# Patient Record
Sex: Female | Born: 1955
Health system: Southern US, Community
[De-identification: ages and names within clinical notes are randomized; demographics above are authoritative.]

## PROBLEM LIST (undated history)

## (undated) DIAGNOSIS — L0293 Carbuncle, unspecified: Secondary | ICD-10-CM

## (undated) DIAGNOSIS — M545 Low back pain: Secondary | ICD-10-CM

## (undated) DIAGNOSIS — L0292 Furuncle, unspecified: Secondary | ICD-10-CM

## (undated) DIAGNOSIS — Z87442 Personal history of urinary calculi: Secondary | ICD-10-CM

## (undated) DIAGNOSIS — G4733 Obstructive sleep apnea (adult) (pediatric): Secondary | ICD-10-CM

## (undated) DIAGNOSIS — K5792 Diverticulitis of intestine, part unspecified, without perforation or abscess without bleeding: Secondary | ICD-10-CM

## (undated) DIAGNOSIS — M810 Age-related osteoporosis without current pathological fracture: Secondary | ICD-10-CM

## (undated) DIAGNOSIS — G243 Spasmodic torticollis: Secondary | ICD-10-CM

## (undated) DIAGNOSIS — K5790 Diverticulosis of intestine, part unspecified, without perforation or abscess without bleeding: Secondary | ICD-10-CM

## (undated) DIAGNOSIS — S93622A Sprain of tarsometatarsal ligament of left foot, initial encounter: Secondary | ICD-10-CM

## (undated) DIAGNOSIS — J449 Chronic obstructive pulmonary disease, unspecified: Secondary | ICD-10-CM

## (undated) DIAGNOSIS — J189 Pneumonia, unspecified organism: Secondary | ICD-10-CM

## (undated) DIAGNOSIS — T7840XA Allergy, unspecified, initial encounter: Secondary | ICD-10-CM

## (undated) DIAGNOSIS — Z860100 Personal history of colon polyps, unspecified: Secondary | ICD-10-CM

## (undated) DIAGNOSIS — K219 Gastro-esophageal reflux disease without esophagitis: Secondary | ICD-10-CM

## (undated) DIAGNOSIS — I10 Essential (primary) hypertension: Secondary | ICD-10-CM

## (undated) DIAGNOSIS — F329 Major depressive disorder, single episode, unspecified: Secondary | ICD-10-CM

## (undated) DIAGNOSIS — R251 Tremor, unspecified: Secondary | ICD-10-CM

## (undated) DIAGNOSIS — N200 Calculus of kidney: Secondary | ICD-10-CM

## (undated) DIAGNOSIS — K3184 Gastroparesis: Secondary | ICD-10-CM

## (undated) DIAGNOSIS — R197 Diarrhea, unspecified: Secondary | ICD-10-CM

## (undated) DIAGNOSIS — F32A Depression, unspecified: Secondary | ICD-10-CM

## (undated) DIAGNOSIS — E785 Hyperlipidemia, unspecified: Secondary | ICD-10-CM

## (undated) DIAGNOSIS — Z9989 Dependence on other enabling machines and devices: Secondary | ICD-10-CM

## (undated) DIAGNOSIS — G5603 Carpal tunnel syndrome, bilateral upper limbs: Secondary | ICD-10-CM

## (undated) DIAGNOSIS — K561 Intussusception: Secondary | ICD-10-CM

## (undated) DIAGNOSIS — G4730 Sleep apnea, unspecified: Secondary | ICD-10-CM

## (undated) DIAGNOSIS — M75100 Unspecified rotator cuff tear or rupture of unspecified shoulder, not specified as traumatic: Secondary | ICD-10-CM

## (undated) DIAGNOSIS — H269 Unspecified cataract: Secondary | ICD-10-CM

## (undated) DIAGNOSIS — Z872 Personal history of diseases of the skin and subcutaneous tissue: Secondary | ICD-10-CM

## (undated) DIAGNOSIS — M1990 Unspecified osteoarthritis, unspecified site: Secondary | ICD-10-CM

## (undated) DIAGNOSIS — F419 Anxiety disorder, unspecified: Secondary | ICD-10-CM

## (undated) DIAGNOSIS — R42 Dizziness and giddiness: Secondary | ICD-10-CM

## (undated) HISTORY — DX: Depression, unspecified: F32.A

## (undated) HISTORY — DX: Personal history of diseases of the skin and subcutaneous tissue: Z87.2

## (undated) HISTORY — DX: Gastroparesis: K31.84

## (undated) HISTORY — DX: Unspecified cataract: H26.9

## (undated) HISTORY — DX: Calculus of kidney: N20.0

## (undated) HISTORY — DX: Low back pain: M54.5

## (undated) HISTORY — DX: Morbid (severe) obesity due to excess calories: E66.01

## (undated) HISTORY — DX: Chronic obstructive pulmonary disease, unspecified: J44.9

## (undated) HISTORY — DX: Gastro-esophageal reflux disease without esophagitis: K21.9

## (undated) HISTORY — DX: Carpal tunnel syndrome, bilateral upper limbs: G56.03

## (undated) HISTORY — PX: NASAL SINUS SURGERY: SHX719

## (undated) HISTORY — DX: Personal history of colon polyps, unspecified: Z86.0100

## (undated) HISTORY — PX: LUMBAR LAMINECTOMY: SHX95

## (undated) HISTORY — DX: Dependence on other enabling machines and devices: Z99.89

## (undated) HISTORY — DX: Allergy, unspecified, initial encounter: T78.40XA

## (undated) HISTORY — PX: KNEE ARTHROSCOPY: SHX127

## (undated) HISTORY — PX: EYE SURGERY: SHX253

## (undated) HISTORY — PX: SUBACROMIAL DECOMPRESSION: SHX5174

## (undated) HISTORY — DX: Anxiety disorder, unspecified: F41.9

## (undated) HISTORY — DX: Age-related osteoporosis without current pathological fracture: M81.0

## (undated) HISTORY — DX: Dizziness and giddiness: R42

## (undated) HISTORY — DX: Unspecified osteoarthritis, unspecified site: M19.90

## (undated) HISTORY — DX: Essential (primary) hypertension: I10

## (undated) HISTORY — DX: Obstructive sleep apnea (adult) (pediatric): G47.33

## (undated) HISTORY — PX: ELBOW SURGERY: SHX618

## (undated) HISTORY — DX: Diverticulosis of intestine, part unspecified, without perforation or abscess without bleeding: K57.90

## (undated) HISTORY — DX: Intussusception: K56.1

## (undated) HISTORY — PX: TUBAL LIGATION: SHX77

## (undated) HISTORY — DX: Diverticulitis of intestine, part unspecified, without perforation or abscess without bleeding: K57.92

## (undated) HISTORY — DX: Pneumonia, unspecified organism: J18.9

## (undated) HISTORY — DX: Personal history of colonic polyps: Z86.010

## (undated) HISTORY — DX: Major depressive disorder, single episode, unspecified: F32.9

## (undated) HISTORY — DX: Sprain of tarsometatarsal ligament of left foot, initial encounter: S93.622A

## (undated) HISTORY — DX: Hyperlipidemia, unspecified: E78.5

## (undated) HISTORY — PX: PILONIDAL CYST EXCISION: SHX744

## (undated) HISTORY — PX: HAND SURGERY: SHX662

---

## 1998-12-24 ENCOUNTER — Encounter: Admission: RE | Admit: 1998-12-24 | Discharge: 1998-12-24 | Payer: Self-pay | Admitting: Hematology and Oncology

## 1999-01-29 ENCOUNTER — Encounter: Admission: RE | Admit: 1999-01-29 | Discharge: 1999-01-29 | Payer: Self-pay | Admitting: Internal Medicine

## 1999-02-23 ENCOUNTER — Encounter: Admission: RE | Admit: 1999-02-23 | Discharge: 1999-02-23 | Payer: Self-pay | Admitting: Internal Medicine

## 1999-03-09 ENCOUNTER — Encounter: Admission: RE | Admit: 1999-03-09 | Discharge: 1999-03-09 | Payer: Self-pay | Admitting: Internal Medicine

## 1999-04-26 ENCOUNTER — Encounter: Admission: RE | Admit: 1999-04-26 | Discharge: 1999-04-26 | Payer: Self-pay | Admitting: Internal Medicine

## 1999-07-05 ENCOUNTER — Encounter: Admission: RE | Admit: 1999-07-05 | Discharge: 1999-07-05 | Payer: Self-pay | Admitting: Internal Medicine

## 1999-07-11 ENCOUNTER — Encounter: Payer: Self-pay | Admitting: *Deleted

## 1999-07-11 ENCOUNTER — Ambulatory Visit (HOSPITAL_COMMUNITY): Admission: RE | Admit: 1999-07-11 | Discharge: 1999-07-11 | Payer: Self-pay | Admitting: *Deleted

## 1999-07-21 ENCOUNTER — Ambulatory Visit (HOSPITAL_COMMUNITY): Admission: RE | Admit: 1999-07-21 | Discharge: 1999-07-21 | Payer: Self-pay | Admitting: *Deleted

## 1999-07-26 ENCOUNTER — Encounter: Admission: RE | Admit: 1999-07-26 | Discharge: 1999-07-26 | Payer: Self-pay | Admitting: Hematology and Oncology

## 1999-08-04 ENCOUNTER — Ambulatory Visit (HOSPITAL_COMMUNITY): Admission: RE | Admit: 1999-08-04 | Discharge: 1999-08-04 | Payer: Self-pay | Admitting: *Deleted

## 1999-08-04 ENCOUNTER — Encounter: Payer: Self-pay | Admitting: *Deleted

## 1999-08-12 ENCOUNTER — Encounter: Admission: RE | Admit: 1999-08-12 | Discharge: 1999-08-12 | Payer: Self-pay | Admitting: Internal Medicine

## 1999-09-30 ENCOUNTER — Encounter: Admission: RE | Admit: 1999-09-30 | Discharge: 1999-09-30 | Payer: Self-pay | Admitting: Internal Medicine

## 1999-11-22 ENCOUNTER — Encounter: Admission: RE | Admit: 1999-11-22 | Discharge: 1999-11-22 | Payer: Self-pay | Admitting: Internal Medicine

## 2000-03-27 ENCOUNTER — Encounter: Admission: RE | Admit: 2000-03-27 | Discharge: 2000-03-27 | Payer: Self-pay | Admitting: Internal Medicine

## 2000-04-09 ENCOUNTER — Emergency Department (HOSPITAL_COMMUNITY): Admission: EM | Admit: 2000-04-09 | Discharge: 2000-04-09 | Payer: Self-pay | Admitting: *Deleted

## 2000-04-09 ENCOUNTER — Encounter: Payer: Self-pay | Admitting: *Deleted

## 2000-05-30 ENCOUNTER — Ambulatory Visit (HOSPITAL_COMMUNITY): Admission: RE | Admit: 2000-05-30 | Discharge: 2000-05-30 | Payer: Self-pay | Admitting: Orthopedic Surgery

## 2000-05-30 ENCOUNTER — Encounter: Admission: RE | Admit: 2000-05-30 | Discharge: 2000-05-30 | Payer: Self-pay | Admitting: Internal Medicine

## 2000-05-30 ENCOUNTER — Encounter: Payer: Self-pay | Admitting: Orthopedic Surgery

## 2000-07-14 ENCOUNTER — Encounter: Payer: Self-pay | Admitting: Orthopedic Surgery

## 2000-07-21 ENCOUNTER — Observation Stay (HOSPITAL_COMMUNITY): Admission: RE | Admit: 2000-07-21 | Discharge: 2000-07-22 | Payer: Self-pay | Admitting: Orthopedic Surgery

## 2000-07-21 ENCOUNTER — Encounter (INDEPENDENT_AMBULATORY_CARE_PROVIDER_SITE_OTHER): Payer: Self-pay | Admitting: Specialist

## 2000-07-21 ENCOUNTER — Encounter: Payer: Self-pay | Admitting: Orthopedic Surgery

## 2000-09-15 ENCOUNTER — Encounter: Admission: RE | Admit: 2000-09-15 | Discharge: 2000-09-15 | Payer: Self-pay | Admitting: Internal Medicine

## 2000-12-19 ENCOUNTER — Encounter: Admission: RE | Admit: 2000-12-19 | Discharge: 2000-12-19 | Payer: Self-pay | Admitting: Internal Medicine

## 2001-02-14 ENCOUNTER — Encounter: Admission: RE | Admit: 2001-02-14 | Discharge: 2001-02-14 | Payer: Self-pay | Admitting: Hematology and Oncology

## 2001-03-20 ENCOUNTER — Encounter: Admission: RE | Admit: 2001-03-20 | Discharge: 2001-03-20 | Payer: Self-pay | Admitting: Internal Medicine

## 2001-04-18 ENCOUNTER — Emergency Department (HOSPITAL_COMMUNITY): Admission: EM | Admit: 2001-04-18 | Discharge: 2001-04-18 | Payer: Self-pay | Admitting: Emergency Medicine

## 2001-04-18 ENCOUNTER — Encounter: Payer: Self-pay | Admitting: Emergency Medicine

## 2001-04-22 ENCOUNTER — Encounter: Payer: Self-pay | Admitting: Emergency Medicine

## 2001-04-22 ENCOUNTER — Emergency Department (HOSPITAL_COMMUNITY): Admission: EM | Admit: 2001-04-22 | Discharge: 2001-04-22 | Payer: Self-pay | Admitting: Emergency Medicine

## 2001-04-23 ENCOUNTER — Encounter: Admission: RE | Admit: 2001-04-23 | Discharge: 2001-04-23 | Payer: Self-pay | Admitting: Internal Medicine

## 2001-04-26 ENCOUNTER — Encounter: Payer: Self-pay | Admitting: Internal Medicine

## 2001-04-26 ENCOUNTER — Ambulatory Visit (HOSPITAL_COMMUNITY): Admission: RE | Admit: 2001-04-26 | Discharge: 2001-04-26 | Payer: Self-pay | Admitting: Internal Medicine

## 2001-04-26 ENCOUNTER — Encounter: Admission: RE | Admit: 2001-04-26 | Discharge: 2001-04-26 | Payer: Self-pay | Admitting: Internal Medicine

## 2001-04-28 ENCOUNTER — Encounter: Payer: Self-pay | Admitting: Internal Medicine

## 2001-04-28 ENCOUNTER — Emergency Department (HOSPITAL_COMMUNITY): Admission: EM | Admit: 2001-04-28 | Discharge: 2001-04-28 | Payer: Self-pay

## 2001-05-17 ENCOUNTER — Ambulatory Visit (HOSPITAL_COMMUNITY): Admission: RE | Admit: 2001-05-17 | Discharge: 2001-05-17 | Payer: Self-pay | Admitting: Urology

## 2001-05-17 ENCOUNTER — Encounter: Admission: RE | Admit: 2001-05-17 | Discharge: 2001-05-17 | Payer: Self-pay

## 2001-05-17 ENCOUNTER — Encounter: Payer: Self-pay | Admitting: Urology

## 2001-06-05 ENCOUNTER — Ambulatory Visit (HOSPITAL_COMMUNITY): Admission: RE | Admit: 2001-06-05 | Discharge: 2001-06-05 | Payer: Self-pay | Admitting: Urology

## 2001-06-07 ENCOUNTER — Ambulatory Visit (HOSPITAL_COMMUNITY): Admission: RE | Admit: 2001-06-07 | Discharge: 2001-06-07 | Payer: Self-pay | Admitting: Urology

## 2001-06-07 ENCOUNTER — Encounter: Payer: Self-pay | Admitting: Urology

## 2001-07-19 ENCOUNTER — Encounter: Admission: RE | Admit: 2001-07-19 | Discharge: 2001-07-19 | Payer: Self-pay | Admitting: Obstetrics

## 2001-10-29 ENCOUNTER — Encounter: Admission: RE | Admit: 2001-10-29 | Discharge: 2002-01-27 | Payer: Self-pay | Admitting: Internal Medicine

## 2002-05-20 ENCOUNTER — Ambulatory Visit (HOSPITAL_BASED_OUTPATIENT_CLINIC_OR_DEPARTMENT_OTHER): Admission: RE | Admit: 2002-05-20 | Discharge: 2002-05-20 | Payer: Self-pay | Admitting: Emergency Medicine

## 2003-02-12 ENCOUNTER — Encounter (INDEPENDENT_AMBULATORY_CARE_PROVIDER_SITE_OTHER): Payer: Self-pay | Admitting: Specialist

## 2003-02-12 ENCOUNTER — Encounter: Payer: Self-pay | Admitting: Internal Medicine

## 2003-02-12 ENCOUNTER — Ambulatory Visit (HOSPITAL_COMMUNITY): Admission: RE | Admit: 2003-02-12 | Discharge: 2003-02-12 | Payer: Self-pay | Admitting: *Deleted

## 2003-02-12 DIAGNOSIS — K5730 Diverticulosis of large intestine without perforation or abscess without bleeding: Secondary | ICD-10-CM | POA: Insufficient documentation

## 2003-12-18 ENCOUNTER — Inpatient Hospital Stay (HOSPITAL_COMMUNITY): Admission: AD | Admit: 2003-12-18 | Discharge: 2003-12-20 | Payer: Self-pay | Admitting: Family Medicine

## 2003-12-19 ENCOUNTER — Encounter: Payer: Self-pay | Admitting: Internal Medicine

## 2003-12-23 ENCOUNTER — Encounter: Admission: RE | Admit: 2003-12-23 | Discharge: 2003-12-23 | Payer: Self-pay | Admitting: Family Medicine

## 2003-12-31 ENCOUNTER — Ambulatory Visit (HOSPITAL_COMMUNITY): Admission: RE | Admit: 2003-12-31 | Discharge: 2003-12-31 | Payer: Self-pay | Admitting: Emergency Medicine

## 2004-10-02 ENCOUNTER — Emergency Department (HOSPITAL_COMMUNITY): Admission: EM | Admit: 2004-10-02 | Discharge: 2004-10-03 | Payer: Self-pay | Admitting: Emergency Medicine

## 2004-10-17 HISTORY — PX: CARPAL TUNNEL RELEASE: SHX101

## 2004-10-29 ENCOUNTER — Ambulatory Visit (HOSPITAL_COMMUNITY): Admission: RE | Admit: 2004-10-29 | Discharge: 2004-10-29 | Payer: Self-pay | Admitting: Emergency Medicine

## 2004-11-01 ENCOUNTER — Ambulatory Visit (HOSPITAL_COMMUNITY): Admission: RE | Admit: 2004-11-01 | Discharge: 2004-11-01 | Payer: Self-pay | Admitting: Emergency Medicine

## 2005-01-19 ENCOUNTER — Ambulatory Visit: Payer: Self-pay | Admitting: Internal Medicine

## 2005-04-07 ENCOUNTER — Ambulatory Visit: Payer: Self-pay | Admitting: Internal Medicine

## 2005-04-21 ENCOUNTER — Ambulatory Visit (HOSPITAL_COMMUNITY): Admission: RE | Admit: 2005-04-21 | Discharge: 2005-04-21 | Payer: Self-pay | Admitting: Internal Medicine

## 2005-05-08 ENCOUNTER — Emergency Department (HOSPITAL_COMMUNITY): Admission: AD | Admit: 2005-05-08 | Discharge: 2005-05-08 | Payer: Self-pay | Admitting: Family Medicine

## 2005-05-10 ENCOUNTER — Inpatient Hospital Stay (HOSPITAL_COMMUNITY): Admission: EM | Admit: 2005-05-10 | Discharge: 2005-05-16 | Payer: Self-pay | Admitting: Emergency Medicine

## 2005-05-10 ENCOUNTER — Ambulatory Visit: Payer: Self-pay | Admitting: Internal Medicine

## 2005-05-20 ENCOUNTER — Inpatient Hospital Stay (HOSPITAL_COMMUNITY): Admission: RE | Admit: 2005-05-20 | Discharge: 2005-05-24 | Payer: Self-pay | Admitting: Internal Medicine

## 2005-05-20 ENCOUNTER — Ambulatory Visit: Payer: Self-pay | Admitting: Internal Medicine

## 2005-05-30 ENCOUNTER — Ambulatory Visit: Payer: Self-pay | Admitting: Internal Medicine

## 2005-05-31 ENCOUNTER — Ambulatory Visit: Payer: Self-pay | Admitting: Internal Medicine

## 2005-06-13 ENCOUNTER — Ambulatory Visit: Payer: Self-pay | Admitting: Internal Medicine

## 2005-07-13 ENCOUNTER — Ambulatory Visit: Payer: Self-pay | Admitting: Internal Medicine

## 2005-08-09 ENCOUNTER — Ambulatory Visit: Payer: Self-pay | Admitting: Internal Medicine

## 2005-08-25 ENCOUNTER — Ambulatory Visit (HOSPITAL_COMMUNITY): Admission: RE | Admit: 2005-08-25 | Discharge: 2005-08-25 | Payer: Self-pay | Admitting: Orthopedic Surgery

## 2005-08-25 ENCOUNTER — Ambulatory Visit (HOSPITAL_BASED_OUTPATIENT_CLINIC_OR_DEPARTMENT_OTHER): Admission: RE | Admit: 2005-08-25 | Discharge: 2005-08-25 | Payer: Self-pay | Admitting: Orthopedic Surgery

## 2006-02-23 ENCOUNTER — Encounter: Admission: RE | Admit: 2006-02-23 | Discharge: 2006-02-23 | Payer: Self-pay | Admitting: Endocrinology

## 2006-04-07 ENCOUNTER — Ambulatory Visit: Payer: Self-pay | Admitting: Internal Medicine

## 2006-04-11 ENCOUNTER — Ambulatory Visit: Payer: Self-pay | Admitting: Internal Medicine

## 2006-04-11 ENCOUNTER — Ambulatory Visit (HOSPITAL_COMMUNITY): Admission: RE | Admit: 2006-04-11 | Discharge: 2006-04-11 | Payer: Self-pay | Admitting: Internal Medicine

## 2006-04-19 ENCOUNTER — Emergency Department (HOSPITAL_COMMUNITY): Admission: EM | Admit: 2006-04-19 | Discharge: 2006-04-19 | Payer: Self-pay | Admitting: Emergency Medicine

## 2006-05-15 ENCOUNTER — Ambulatory Visit: Payer: Self-pay | Admitting: Internal Medicine

## 2006-05-16 ENCOUNTER — Ambulatory Visit: Payer: Self-pay | Admitting: Hospitalist

## 2006-05-22 ENCOUNTER — Ambulatory Visit: Payer: Self-pay | Admitting: Internal Medicine

## 2006-05-22 ENCOUNTER — Ambulatory Visit (HOSPITAL_COMMUNITY): Admission: RE | Admit: 2006-05-22 | Discharge: 2006-05-22 | Payer: Self-pay | Admitting: Internal Medicine

## 2006-05-31 ENCOUNTER — Encounter: Payer: Self-pay | Admitting: Cardiology

## 2006-05-31 ENCOUNTER — Ambulatory Visit (HOSPITAL_COMMUNITY): Admission: RE | Admit: 2006-05-31 | Discharge: 2006-05-31 | Payer: Self-pay | Admitting: Internal Medicine

## 2006-05-31 ENCOUNTER — Ambulatory Visit: Payer: Self-pay | Admitting: Cardiology

## 2006-06-05 ENCOUNTER — Ambulatory Visit: Payer: Self-pay | Admitting: Internal Medicine

## 2006-06-13 ENCOUNTER — Ambulatory Visit: Payer: Self-pay

## 2006-06-26 ENCOUNTER — Ambulatory Visit: Payer: Self-pay | Admitting: Internal Medicine

## 2006-06-27 ENCOUNTER — Emergency Department (HOSPITAL_COMMUNITY): Admission: EM | Admit: 2006-06-27 | Discharge: 2006-06-28 | Payer: Self-pay | Admitting: Emergency Medicine

## 2006-07-12 ENCOUNTER — Emergency Department (HOSPITAL_COMMUNITY): Admission: EM | Admit: 2006-07-12 | Discharge: 2006-07-12 | Payer: Self-pay | Admitting: Family Medicine

## 2006-08-08 DIAGNOSIS — E1149 Type 2 diabetes mellitus with other diabetic neurological complication: Secondary | ICD-10-CM | POA: Insufficient documentation

## 2006-08-08 DIAGNOSIS — E785 Hyperlipidemia, unspecified: Secondary | ICD-10-CM | POA: Insufficient documentation

## 2006-08-08 DIAGNOSIS — F329 Major depressive disorder, single episode, unspecified: Secondary | ICD-10-CM

## 2006-08-08 DIAGNOSIS — Z8639 Personal history of other endocrine, nutritional and metabolic disease: Secondary | ICD-10-CM

## 2006-08-08 DIAGNOSIS — F3289 Other specified depressive episodes: Secondary | ICD-10-CM | POA: Insufficient documentation

## 2006-08-08 DIAGNOSIS — I10 Essential (primary) hypertension: Secondary | ICD-10-CM

## 2006-08-08 DIAGNOSIS — I152 Hypertension secondary to endocrine disorders: Secondary | ICD-10-CM | POA: Insufficient documentation

## 2006-08-08 DIAGNOSIS — K219 Gastro-esophageal reflux disease without esophagitis: Secondary | ICD-10-CM | POA: Insufficient documentation

## 2006-08-08 DIAGNOSIS — Z862 Personal history of diseases of the blood and blood-forming organs and certain disorders involving the immune mechanism: Secondary | ICD-10-CM | POA: Insufficient documentation

## 2006-08-08 DIAGNOSIS — J4489 Other specified chronic obstructive pulmonary disease: Secondary | ICD-10-CM | POA: Insufficient documentation

## 2006-08-08 DIAGNOSIS — J449 Chronic obstructive pulmonary disease, unspecified: Secondary | ICD-10-CM | POA: Insufficient documentation

## 2006-08-08 DIAGNOSIS — E1159 Type 2 diabetes mellitus with other circulatory complications: Secondary | ICD-10-CM

## 2006-08-29 ENCOUNTER — Encounter (INDEPENDENT_AMBULATORY_CARE_PROVIDER_SITE_OTHER): Payer: Self-pay | Admitting: Internal Medicine

## 2006-08-29 ENCOUNTER — Ambulatory Visit: Payer: Self-pay | Admitting: Internal Medicine

## 2006-08-29 LAB — CONVERTED CEMR LAB
Alkaline Phosphatase: 127 units/L — ABNORMAL HIGH (ref 39–117)
BUN: 15 mg/dL (ref 6–23)
CO2: 29 meq/L (ref 19–32)
Chloride: 103 meq/L (ref 96–112)
Creatinine, Ser: 0.81 mg/dL (ref 0.40–1.20)
Glucose, Bld: 76 mg/dL (ref 70–99)
Potassium: 4.1 meq/L (ref 3.5–5.3)
Sodium: 139 meq/L (ref 135–145)

## 2006-10-17 DIAGNOSIS — G4733 Obstructive sleep apnea (adult) (pediatric): Secondary | ICD-10-CM

## 2006-10-17 HISTORY — DX: Obstructive sleep apnea (adult) (pediatric): G47.33

## 2006-10-23 ENCOUNTER — Ambulatory Visit: Payer: Self-pay | Admitting: Hospitalist

## 2006-10-23 ENCOUNTER — Encounter (INDEPENDENT_AMBULATORY_CARE_PROVIDER_SITE_OTHER): Payer: Self-pay | Admitting: Internal Medicine

## 2006-10-24 ENCOUNTER — Ambulatory Visit (HOSPITAL_COMMUNITY): Admission: RE | Admit: 2006-10-24 | Discharge: 2006-10-24 | Payer: Self-pay | Admitting: Family Medicine

## 2006-11-01 ENCOUNTER — Encounter: Payer: Self-pay | Admitting: Licensed Clinical Social Worker

## 2006-11-13 ENCOUNTER — Encounter: Payer: Self-pay | Admitting: Licensed Clinical Social Worker

## 2006-11-27 ENCOUNTER — Encounter: Payer: Self-pay | Admitting: Licensed Clinical Social Worker

## 2006-12-25 ENCOUNTER — Encounter (INDEPENDENT_AMBULATORY_CARE_PROVIDER_SITE_OTHER): Payer: Self-pay | Admitting: Dermatology

## 2006-12-25 ENCOUNTER — Ambulatory Visit: Payer: Self-pay | Admitting: Internal Medicine

## 2006-12-25 DIAGNOSIS — J309 Allergic rhinitis, unspecified: Secondary | ICD-10-CM | POA: Insufficient documentation

## 2006-12-25 LAB — CONVERTED CEMR LAB
Blood Glucose, Fingerstick: 228
Hgb A1c MFr Bld: 7.5 %

## 2007-01-02 ENCOUNTER — Telehealth (INDEPENDENT_AMBULATORY_CARE_PROVIDER_SITE_OTHER): Payer: Self-pay | Admitting: *Deleted

## 2007-01-10 ENCOUNTER — Emergency Department (HOSPITAL_COMMUNITY): Admission: EM | Admit: 2007-01-10 | Discharge: 2007-01-10 | Payer: Self-pay | Admitting: Emergency Medicine

## 2007-01-30 ENCOUNTER — Ambulatory Visit: Payer: Self-pay | Admitting: Internal Medicine

## 2007-01-30 ENCOUNTER — Encounter (INDEPENDENT_AMBULATORY_CARE_PROVIDER_SITE_OTHER): Payer: Self-pay | Admitting: Internal Medicine

## 2007-01-30 DIAGNOSIS — L219 Seborrheic dermatitis, unspecified: Secondary | ICD-10-CM | POA: Insufficient documentation

## 2007-01-30 DIAGNOSIS — D509 Iron deficiency anemia, unspecified: Secondary | ICD-10-CM | POA: Insufficient documentation

## 2007-01-30 LAB — CONVERTED CEMR LAB
ALT: 35 units/L (ref 0–35)
CO2: 22 meq/L (ref 19–32)
Chloride: 102 meq/L (ref 96–112)
Creatinine, Ser: 0.85 mg/dL (ref 0.40–1.20)
Lipase: 43 units/L (ref 0–75)
Potassium: 4 meq/L (ref 3.5–5.3)
Sodium: 140 meq/L (ref 135–145)
Total Bilirubin: 0.4 mg/dL (ref 0.3–1.2)
Total Protein: 6.4 g/dL (ref 6.0–8.3)

## 2007-01-31 ENCOUNTER — Telehealth: Payer: Self-pay | Admitting: *Deleted

## 2007-02-02 ENCOUNTER — Telehealth: Payer: Self-pay | Admitting: *Deleted

## 2007-02-08 ENCOUNTER — Ambulatory Visit: Payer: Self-pay | Admitting: Internal Medicine

## 2007-02-13 ENCOUNTER — Ambulatory Visit: Payer: Self-pay | Admitting: Internal Medicine

## 2007-02-13 DIAGNOSIS — G243 Spasmodic torticollis: Secondary | ICD-10-CM | POA: Insufficient documentation

## 2007-02-13 LAB — CONVERTED CEMR LAB: Blood Glucose, Fingerstick: 144

## 2007-02-14 ENCOUNTER — Encounter: Payer: Self-pay | Admitting: Internal Medicine

## 2007-03-07 ENCOUNTER — Encounter (INDEPENDENT_AMBULATORY_CARE_PROVIDER_SITE_OTHER): Payer: Self-pay | Admitting: Internal Medicine

## 2007-03-13 ENCOUNTER — Encounter (INDEPENDENT_AMBULATORY_CARE_PROVIDER_SITE_OTHER): Payer: Self-pay | Admitting: Internal Medicine

## 2007-03-20 ENCOUNTER — Emergency Department (HOSPITAL_COMMUNITY): Admission: EM | Admit: 2007-03-20 | Discharge: 2007-03-20 | Payer: Self-pay | Admitting: Emergency Medicine

## 2007-03-27 ENCOUNTER — Encounter (INDEPENDENT_AMBULATORY_CARE_PROVIDER_SITE_OTHER): Payer: Self-pay | Admitting: Internal Medicine

## 2007-03-28 ENCOUNTER — Encounter (INDEPENDENT_AMBULATORY_CARE_PROVIDER_SITE_OTHER): Payer: Self-pay | Admitting: Internal Medicine

## 2007-03-28 ENCOUNTER — Ambulatory Visit: Payer: Self-pay | Admitting: Internal Medicine

## 2007-03-28 LAB — CONVERTED CEMR LAB: Blood Glucose, Fingerstick: 269

## 2007-03-30 ENCOUNTER — Telehealth (INDEPENDENT_AMBULATORY_CARE_PROVIDER_SITE_OTHER): Payer: Self-pay | Admitting: Internal Medicine

## 2007-04-11 ENCOUNTER — Ambulatory Visit: Payer: Self-pay | Admitting: Internal Medicine

## 2007-04-25 ENCOUNTER — Encounter (INDEPENDENT_AMBULATORY_CARE_PROVIDER_SITE_OTHER): Payer: Self-pay | Admitting: Internal Medicine

## 2007-04-25 ENCOUNTER — Telehealth (INDEPENDENT_AMBULATORY_CARE_PROVIDER_SITE_OTHER): Payer: Self-pay | Admitting: *Deleted

## 2007-05-03 ENCOUNTER — Telehealth: Payer: Self-pay | Admitting: *Deleted

## 2007-05-07 ENCOUNTER — Telehealth (INDEPENDENT_AMBULATORY_CARE_PROVIDER_SITE_OTHER): Payer: Self-pay | Admitting: *Deleted

## 2007-05-09 ENCOUNTER — Encounter (INDEPENDENT_AMBULATORY_CARE_PROVIDER_SITE_OTHER): Payer: Self-pay | Admitting: Internal Medicine

## 2007-05-09 ENCOUNTER — Ambulatory Visit: Payer: Self-pay | Admitting: Internal Medicine

## 2007-05-09 LAB — CONVERTED CEMR LAB: Blood Glucose, Fingerstick: 179

## 2007-05-11 ENCOUNTER — Encounter (INDEPENDENT_AMBULATORY_CARE_PROVIDER_SITE_OTHER): Payer: Self-pay | Admitting: Internal Medicine

## 2007-05-28 ENCOUNTER — Telehealth: Payer: Self-pay | Admitting: *Deleted

## 2007-05-30 ENCOUNTER — Telehealth (INDEPENDENT_AMBULATORY_CARE_PROVIDER_SITE_OTHER): Payer: Self-pay | Admitting: Pharmacy Technician

## 2007-06-05 ENCOUNTER — Telehealth: Payer: Self-pay | Admitting: *Deleted

## 2007-06-06 ENCOUNTER — Ambulatory Visit: Payer: Self-pay | Admitting: *Deleted

## 2007-06-06 ENCOUNTER — Encounter (INDEPENDENT_AMBULATORY_CARE_PROVIDER_SITE_OTHER): Payer: Self-pay | Admitting: Internal Medicine

## 2007-07-10 ENCOUNTER — Ambulatory Visit: Payer: Self-pay | Admitting: Hospitalist

## 2007-07-10 LAB — CONVERTED CEMR LAB: Blood Glucose, Fingerstick: 166

## 2007-07-11 ENCOUNTER — Ambulatory Visit: Payer: Self-pay | Admitting: Internal Medicine

## 2007-07-13 ENCOUNTER — Ambulatory Visit (HOSPITAL_COMMUNITY): Admission: RE | Admit: 2007-07-13 | Discharge: 2007-07-13 | Payer: Self-pay | Admitting: Hospitalist

## 2007-07-23 ENCOUNTER — Ambulatory Visit: Payer: Self-pay | Admitting: *Deleted

## 2007-08-14 ENCOUNTER — Encounter (INDEPENDENT_AMBULATORY_CARE_PROVIDER_SITE_OTHER): Payer: Self-pay | Admitting: Internal Medicine

## 2007-10-09 ENCOUNTER — Telehealth: Payer: Self-pay | Admitting: *Deleted

## 2007-10-31 ENCOUNTER — Telehealth (INDEPENDENT_AMBULATORY_CARE_PROVIDER_SITE_OTHER): Payer: Self-pay | Admitting: *Deleted

## 2007-11-21 ENCOUNTER — Encounter (INDEPENDENT_AMBULATORY_CARE_PROVIDER_SITE_OTHER): Payer: Self-pay | Admitting: Internal Medicine

## 2007-11-23 ENCOUNTER — Telehealth: Payer: Self-pay | Admitting: *Deleted

## 2007-11-26 ENCOUNTER — Telehealth (INDEPENDENT_AMBULATORY_CARE_PROVIDER_SITE_OTHER): Payer: Self-pay | Admitting: Pharmacy Technician

## 2007-11-27 ENCOUNTER — Ambulatory Visit: Payer: Self-pay | Admitting: Internal Medicine

## 2007-11-27 ENCOUNTER — Encounter: Payer: Self-pay | Admitting: Internal Medicine

## 2007-11-27 ENCOUNTER — Ambulatory Visit (HOSPITAL_COMMUNITY): Admission: RE | Admit: 2007-11-27 | Discharge: 2007-11-27 | Payer: Self-pay | Admitting: Internal Medicine

## 2007-11-27 DIAGNOSIS — M545 Low back pain, unspecified: Secondary | ICD-10-CM | POA: Insufficient documentation

## 2007-11-27 LAB — CONVERTED CEMR LAB
ALT: 59 units/L — ABNORMAL HIGH (ref 0–35)
Albumin: 3.6 g/dL (ref 3.5–5.2)
BUN: 15 mg/dL (ref 6–23)
CO2: 28 meq/L (ref 19–32)
Chloride: 103 meq/L (ref 96–112)
Eosinophils Absolute: 0.3 10*3/uL (ref 0.0–0.7)
Glucose, Bld: 92 mg/dL (ref 70–99)
Lipase: 21 units/L (ref 11–59)
MCHC: 32.3 g/dL (ref 30.0–36.0)
Monocytes Absolute: 0.6 10*3/uL (ref 0.1–1.0)
Monocytes Relative: 6 % (ref 3–12)
Neutro Abs: 7.2 10*3/uL (ref 1.7–7.7)
Neutrophils Relative %: 69 % (ref 43–77)
Platelets: 227 10*3/uL (ref 150–400)
RBC: 5.15 M/uL — ABNORMAL HIGH (ref 3.87–5.11)
RDW: 15.9 % — ABNORMAL HIGH (ref 11.5–15.5)
Total Bilirubin: 0.3 mg/dL (ref 0.3–1.2)
Total Protein: 6.2 g/dL (ref 6.0–8.3)
WBC: 10.4 10*3/uL (ref 4.0–10.5)

## 2007-12-05 ENCOUNTER — Ambulatory Visit: Payer: Self-pay | Admitting: Internal Medicine

## 2007-12-05 LAB — CONVERTED CEMR LAB: Blood Glucose, Fingerstick: 146

## 2007-12-12 ENCOUNTER — Telehealth (INDEPENDENT_AMBULATORY_CARE_PROVIDER_SITE_OTHER): Payer: Self-pay | Admitting: Pharmacy Technician

## 2007-12-12 ENCOUNTER — Encounter: Payer: Self-pay | Admitting: Internal Medicine

## 2007-12-12 ENCOUNTER — Ambulatory Visit: Payer: Self-pay | Admitting: Internal Medicine

## 2007-12-12 LAB — CONVERTED CEMR LAB
ALT: 27 units/L (ref 0–35)
AST: 22 units/L (ref 0–37)
Albumin: 4 g/dL (ref 3.5–5.2)
BUN: 34 mg/dL — ABNORMAL HIGH (ref 6–23)
Creatinine, Ser: 1.05 mg/dL (ref 0.40–1.20)
Potassium: 4.6 meq/L (ref 3.5–5.3)
Total CHOL/HDL Ratio: 3.1
Total Protein: 6.8 g/dL (ref 6.0–8.3)
Triglycerides: 123 mg/dL (ref <150)

## 2007-12-27 ENCOUNTER — Ambulatory Visit: Payer: Self-pay | Admitting: Infectious Diseases

## 2008-01-03 ENCOUNTER — Ambulatory Visit (HOSPITAL_COMMUNITY): Admission: RE | Admit: 2008-01-03 | Discharge: 2008-01-03 | Payer: Self-pay | Admitting: Infectious Diseases

## 2008-01-03 ENCOUNTER — Encounter (INDEPENDENT_AMBULATORY_CARE_PROVIDER_SITE_OTHER): Payer: Self-pay | Admitting: Internal Medicine

## 2008-01-03 ENCOUNTER — Ambulatory Visit: Payer: Self-pay | Admitting: Internal Medicine

## 2008-01-03 LAB — CONVERTED CEMR LAB
Blood Glucose, Fingerstick: 99
Blood Glucose, Home Monitor: 2 mg/dL

## 2008-01-09 ENCOUNTER — Telehealth: Payer: Self-pay | Admitting: *Deleted

## 2008-01-10 ENCOUNTER — Telehealth: Payer: Self-pay | Admitting: Internal Medicine

## 2008-01-28 DIAGNOSIS — J45909 Unspecified asthma, uncomplicated: Secondary | ICD-10-CM | POA: Insufficient documentation

## 2008-01-28 DIAGNOSIS — F418 Other specified anxiety disorders: Secondary | ICD-10-CM | POA: Insufficient documentation

## 2008-01-28 DIAGNOSIS — Z87442 Personal history of urinary calculi: Secondary | ICD-10-CM | POA: Insufficient documentation

## 2008-02-05 ENCOUNTER — Encounter (INDEPENDENT_AMBULATORY_CARE_PROVIDER_SITE_OTHER): Payer: Self-pay | Admitting: Internal Medicine

## 2008-02-05 ENCOUNTER — Telehealth (INDEPENDENT_AMBULATORY_CARE_PROVIDER_SITE_OTHER): Payer: Self-pay | Admitting: Internal Medicine

## 2008-02-05 ENCOUNTER — Ambulatory Visit: Payer: Self-pay | Admitting: Internal Medicine

## 2008-02-05 LAB — CONVERTED CEMR LAB: Blood Glucose, Fingerstick: 172

## 2008-02-06 ENCOUNTER — Telehealth (INDEPENDENT_AMBULATORY_CARE_PROVIDER_SITE_OTHER): Payer: Self-pay | Admitting: Internal Medicine

## 2008-02-12 ENCOUNTER — Encounter: Admission: RE | Admit: 2008-02-12 | Discharge: 2008-05-12 | Payer: Self-pay | Admitting: Internal Medicine

## 2008-02-12 ENCOUNTER — Encounter (INDEPENDENT_AMBULATORY_CARE_PROVIDER_SITE_OTHER): Payer: Self-pay | Admitting: Internal Medicine

## 2008-02-13 ENCOUNTER — Telehealth (INDEPENDENT_AMBULATORY_CARE_PROVIDER_SITE_OTHER): Payer: Self-pay | Admitting: *Deleted

## 2008-02-18 ENCOUNTER — Encounter (INDEPENDENT_AMBULATORY_CARE_PROVIDER_SITE_OTHER): Payer: Self-pay | Admitting: Internal Medicine

## 2008-02-26 ENCOUNTER — Telehealth: Payer: Self-pay | Admitting: *Deleted

## 2008-03-12 ENCOUNTER — Ambulatory Visit: Payer: Self-pay | Admitting: *Deleted

## 2008-03-12 ENCOUNTER — Encounter (INDEPENDENT_AMBULATORY_CARE_PROVIDER_SITE_OTHER): Payer: Self-pay | Admitting: Internal Medicine

## 2008-03-12 LAB — CONVERTED CEMR LAB
Creatinine, Urine: 32.2 mg/dL
Microalb Creat Ratio: 20.8 mg/g (ref 0.0–30.0)

## 2008-03-13 ENCOUNTER — Telehealth (INDEPENDENT_AMBULATORY_CARE_PROVIDER_SITE_OTHER): Payer: Self-pay | Admitting: Pharmacy Technician

## 2008-03-19 ENCOUNTER — Ambulatory Visit: Payer: Self-pay | Admitting: *Deleted

## 2008-03-19 ENCOUNTER — Telehealth: Payer: Self-pay | Admitting: *Deleted

## 2008-03-28 ENCOUNTER — Telehealth (INDEPENDENT_AMBULATORY_CARE_PROVIDER_SITE_OTHER): Payer: Self-pay | Admitting: Internal Medicine

## 2008-04-04 ENCOUNTER — Telehealth: Payer: Self-pay | Admitting: *Deleted

## 2008-04-04 ENCOUNTER — Telehealth: Payer: Self-pay | Admitting: Internal Medicine

## 2008-04-08 ENCOUNTER — Encounter (INDEPENDENT_AMBULATORY_CARE_PROVIDER_SITE_OTHER): Payer: Self-pay | Admitting: Internal Medicine

## 2008-04-16 ENCOUNTER — Encounter: Payer: Self-pay | Admitting: Internal Medicine

## 2008-05-07 ENCOUNTER — Telehealth (INDEPENDENT_AMBULATORY_CARE_PROVIDER_SITE_OTHER): Payer: Self-pay | Admitting: Internal Medicine

## 2008-05-28 ENCOUNTER — Ambulatory Visit: Payer: Self-pay | Admitting: Infectious Diseases

## 2008-05-28 ENCOUNTER — Encounter (INDEPENDENT_AMBULATORY_CARE_PROVIDER_SITE_OTHER): Payer: Self-pay | Admitting: Internal Medicine

## 2008-05-28 LAB — CONVERTED CEMR LAB: Hgb A1c MFr Bld: 7.4 %

## 2008-05-29 ENCOUNTER — Telehealth (INDEPENDENT_AMBULATORY_CARE_PROVIDER_SITE_OTHER): Payer: Self-pay | Admitting: *Deleted

## 2008-05-30 ENCOUNTER — Telehealth (INDEPENDENT_AMBULATORY_CARE_PROVIDER_SITE_OTHER): Payer: Self-pay | Admitting: Internal Medicine

## 2008-06-10 ENCOUNTER — Telehealth (INDEPENDENT_AMBULATORY_CARE_PROVIDER_SITE_OTHER): Payer: Self-pay | Admitting: Internal Medicine

## 2008-06-19 ENCOUNTER — Telehealth: Payer: Self-pay | Admitting: *Deleted

## 2008-06-20 ENCOUNTER — Telehealth: Payer: Self-pay | Admitting: Internal Medicine

## 2008-06-20 ENCOUNTER — Ambulatory Visit: Payer: Self-pay | Admitting: Internal Medicine

## 2008-07-14 ENCOUNTER — Telehealth: Payer: Self-pay | Admitting: Internal Medicine

## 2008-07-21 ENCOUNTER — Ambulatory Visit: Payer: Self-pay | Admitting: Internal Medicine

## 2008-07-24 ENCOUNTER — Telehealth: Payer: Self-pay | Admitting: Licensed Clinical Social Worker

## 2008-07-25 ENCOUNTER — Telehealth (INDEPENDENT_AMBULATORY_CARE_PROVIDER_SITE_OTHER): Payer: Self-pay | Admitting: Internal Medicine

## 2008-07-25 ENCOUNTER — Encounter: Payer: Self-pay | Admitting: Licensed Clinical Social Worker

## 2008-07-31 ENCOUNTER — Encounter: Payer: Self-pay | Admitting: Internal Medicine

## 2008-07-31 ENCOUNTER — Ambulatory Visit (HOSPITAL_COMMUNITY): Admission: RE | Admit: 2008-07-31 | Discharge: 2008-07-31 | Payer: Self-pay | Admitting: Internal Medicine

## 2008-08-04 ENCOUNTER — Encounter: Payer: Self-pay | Admitting: Licensed Clinical Social Worker

## 2008-08-04 ENCOUNTER — Encounter: Admission: RE | Admit: 2008-08-04 | Discharge: 2008-10-13 | Payer: Self-pay | Admitting: Internal Medicine

## 2008-08-12 ENCOUNTER — Telehealth (INDEPENDENT_AMBULATORY_CARE_PROVIDER_SITE_OTHER): Payer: Self-pay | Admitting: Internal Medicine

## 2008-08-13 ENCOUNTER — Encounter: Admission: RE | Admit: 2008-08-13 | Discharge: 2008-08-13 | Payer: Self-pay | Admitting: Internal Medicine

## 2008-08-14 ENCOUNTER — Encounter (INDEPENDENT_AMBULATORY_CARE_PROVIDER_SITE_OTHER): Payer: Self-pay | Admitting: Internal Medicine

## 2008-08-21 ENCOUNTER — Ambulatory Visit: Payer: Self-pay | Admitting: Internal Medicine

## 2008-08-21 ENCOUNTER — Encounter: Payer: Self-pay | Admitting: Internal Medicine

## 2008-08-21 LAB — CONVERTED CEMR LAB
Blood Glucose, Fingerstick: 121
CO2: 25 meq/L (ref 19–32)
Creatinine, Ser: 0.98 mg/dL (ref 0.40–1.20)
Glucose, Bld: 130 mg/dL — ABNORMAL HIGH (ref 70–99)

## 2008-09-08 ENCOUNTER — Encounter (INDEPENDENT_AMBULATORY_CARE_PROVIDER_SITE_OTHER): Payer: Self-pay | Admitting: Internal Medicine

## 2008-09-08 ENCOUNTER — Telehealth (INDEPENDENT_AMBULATORY_CARE_PROVIDER_SITE_OTHER): Payer: Self-pay | Admitting: Internal Medicine

## 2008-09-09 ENCOUNTER — Encounter (INDEPENDENT_AMBULATORY_CARE_PROVIDER_SITE_OTHER): Payer: Self-pay | Admitting: Internal Medicine

## 2008-09-16 ENCOUNTER — Ambulatory Visit: Payer: Self-pay | Admitting: Internal Medicine

## 2008-09-16 DIAGNOSIS — Z860100 Personal history of colon polyps, unspecified: Secondary | ICD-10-CM | POA: Insufficient documentation

## 2008-09-22 ENCOUNTER — Telehealth (INDEPENDENT_AMBULATORY_CARE_PROVIDER_SITE_OTHER): Payer: Self-pay | Admitting: Internal Medicine

## 2008-10-02 ENCOUNTER — Encounter (INDEPENDENT_AMBULATORY_CARE_PROVIDER_SITE_OTHER): Payer: Self-pay | Admitting: Internal Medicine

## 2008-10-02 ENCOUNTER — Ambulatory Visit: Payer: Self-pay | Admitting: Internal Medicine

## 2008-10-02 LAB — CONVERTED CEMR LAB
ALT: 24 units/L (ref 0–35)
AST: 17 units/L (ref 0–37)
Alkaline Phosphatase: 100 units/L (ref 39–117)
Blood Glucose, Fingerstick: 87
CO2: 25 meq/L (ref 19–32)
Calcium: 10.1 mg/dL (ref 8.4–10.5)
Chloride: 104 meq/L (ref 96–112)
Cholesterol: 173 mg/dL (ref 0–200)
Creatinine, Ser: 0.83 mg/dL (ref 0.40–1.20)
Glucose, Bld: 48 mg/dL — ABNORMAL LOW (ref 70–99)
HDL: 49 mg/dL (ref >39)
Hemoglobin: 14.4 g/dL (ref 12.0–15.0)
MCHC: 31.4 g/dL (ref 30.0–36.0)
MCV: 85.5 fL (ref 78.0–100.0)
Potassium: 4.2 meq/L (ref 3.5–5.3)
RBC: 5.37 M/uL — ABNORMAL HIGH (ref 3.87–5.11)
Sodium: 143 meq/L (ref 135–145)
TSH: 3.008 microintl units/mL (ref 0.350–4.50)
Total Bilirubin: 0.4 mg/dL (ref 0.3–1.2)
Triglycerides: 127 mg/dL (ref <150)
WBC: 12.9 10*3/uL — ABNORMAL HIGH (ref 4.0–10.5)

## 2008-10-13 ENCOUNTER — Ambulatory Visit: Payer: Self-pay | Admitting: Internal Medicine

## 2008-10-20 ENCOUNTER — Ambulatory Visit (HOSPITAL_COMMUNITY): Admission: RE | Admit: 2008-10-20 | Discharge: 2008-10-20 | Payer: Self-pay | Admitting: Internal Medicine

## 2008-10-20 ENCOUNTER — Ambulatory Visit: Payer: Self-pay | Admitting: Internal Medicine

## 2008-10-20 ENCOUNTER — Encounter: Payer: Self-pay | Admitting: Internal Medicine

## 2008-10-21 ENCOUNTER — Encounter: Payer: Self-pay | Admitting: Internal Medicine

## 2008-10-23 ENCOUNTER — Telehealth (INDEPENDENT_AMBULATORY_CARE_PROVIDER_SITE_OTHER): Payer: Self-pay | Admitting: Internal Medicine

## 2008-11-28 ENCOUNTER — Encounter (INDEPENDENT_AMBULATORY_CARE_PROVIDER_SITE_OTHER): Payer: Self-pay | Admitting: Internal Medicine

## 2008-11-28 ENCOUNTER — Ambulatory Visit: Payer: Self-pay | Admitting: Internal Medicine

## 2008-11-28 LAB — CONVERTED CEMR LAB
Blood Glucose, Fingerstick: 177
Hgb A1c MFr Bld: 6.7 %

## 2008-12-17 ENCOUNTER — Telehealth (INDEPENDENT_AMBULATORY_CARE_PROVIDER_SITE_OTHER): Payer: Self-pay | Admitting: Internal Medicine

## 2009-01-26 ENCOUNTER — Telehealth (INDEPENDENT_AMBULATORY_CARE_PROVIDER_SITE_OTHER): Payer: Self-pay | Admitting: Internal Medicine

## 2009-02-10 ENCOUNTER — Ambulatory Visit: Payer: Self-pay | Admitting: Internal Medicine

## 2009-02-10 ENCOUNTER — Encounter (INDEPENDENT_AMBULATORY_CARE_PROVIDER_SITE_OTHER): Payer: Self-pay | Admitting: *Deleted

## 2009-02-10 ENCOUNTER — Ambulatory Visit: Payer: Self-pay | Admitting: Infectious Diseases

## 2009-02-10 ENCOUNTER — Inpatient Hospital Stay (HOSPITAL_COMMUNITY): Admission: AD | Admit: 2009-02-10 | Discharge: 2009-02-11 | Payer: Self-pay | Admitting: Infectious Diseases

## 2009-02-11 ENCOUNTER — Encounter (INDEPENDENT_AMBULATORY_CARE_PROVIDER_SITE_OTHER): Payer: Self-pay | Admitting: *Deleted

## 2009-02-11 ENCOUNTER — Encounter: Payer: Self-pay | Admitting: Infectious Diseases

## 2009-02-18 ENCOUNTER — Telehealth (INDEPENDENT_AMBULATORY_CARE_PROVIDER_SITE_OTHER): Payer: Self-pay | Admitting: Internal Medicine

## 2009-02-23 ENCOUNTER — Encounter (INDEPENDENT_AMBULATORY_CARE_PROVIDER_SITE_OTHER): Payer: Self-pay | Admitting: *Deleted

## 2009-02-23 ENCOUNTER — Ambulatory Visit: Payer: Self-pay | Admitting: *Deleted

## 2009-02-23 LAB — CONVERTED CEMR LAB
CO2: 19 meq/L (ref 19–32)
Calcium: 9 mg/dL (ref 8.4–10.5)
Chloride: 103 meq/L (ref 96–112)
GFR calc Af Amer: >60 mL/min (ref >60)

## 2009-03-25 ENCOUNTER — Encounter (INDEPENDENT_AMBULATORY_CARE_PROVIDER_SITE_OTHER): Payer: Self-pay | Admitting: Internal Medicine

## 2009-03-25 ENCOUNTER — Ambulatory Visit: Payer: Self-pay | Admitting: Internal Medicine

## 2009-03-25 LAB — CONVERTED CEMR LAB
ALT: 18 units/L (ref 0–35)
AST: 15 units/L (ref 0–37)
Alkaline Phosphatase: 92 units/L (ref 39–117)
CO2: 27 meq/L (ref 19–32)
GFR calc Af Amer: >60 mL/min (ref >60)
HDL: 49 mg/dL (ref >39)
LDL Cholesterol: 105 mg/dL — ABNORMAL HIGH (ref 0–99)
Sodium: 139 meq/L (ref 135–145)
TSH: 1.796 microintl units/mL (ref 0.350–4.500)
Total Bilirubin: 0.3 mg/dL (ref 0.3–1.2)
Total CHOL/HDL Ratio: 3.9
Total Protein: 7.4 g/dL (ref 6.0–8.3)
VLDL: 35 mg/dL (ref 0–40)

## 2009-04-07 ENCOUNTER — Telehealth (INDEPENDENT_AMBULATORY_CARE_PROVIDER_SITE_OTHER): Payer: Self-pay | Admitting: Internal Medicine

## 2009-04-21 ENCOUNTER — Telehealth (INDEPENDENT_AMBULATORY_CARE_PROVIDER_SITE_OTHER): Payer: Self-pay | Admitting: Internal Medicine

## 2009-04-21 ENCOUNTER — Encounter (INDEPENDENT_AMBULATORY_CARE_PROVIDER_SITE_OTHER): Payer: Self-pay | Admitting: Internal Medicine

## 2009-05-08 ENCOUNTER — Ambulatory Visit: Payer: Self-pay | Admitting: Internal Medicine

## 2009-05-08 LAB — CONVERTED CEMR LAB
Creatinine, Urine: 35.7 mg/dL
MCHC: 31.6 g/dL (ref 30.0–36.0)
MCV: 86.6 fL (ref 78.0–100.0)
Microalb Creat Ratio: 24.4 mg/g (ref 0.0–30.0)
Platelets: 254 10*3/uL (ref 150–400)
RBC Folate: 980 ng/mL — ABNORMAL HIGH (ref 180–600)
RDW: 15.2 % (ref 11.5–15.5)
Vit D, 25-Hydroxy: 16 ng/mL — ABNORMAL LOW (ref 30–89)
Vitamin B-12: 410 pg/mL (ref 211–911)

## 2009-05-25 ENCOUNTER — Telehealth: Payer: Self-pay | Admitting: *Deleted

## 2009-05-27 ENCOUNTER — Ambulatory Visit: Payer: Self-pay | Admitting: Internal Medicine

## 2009-05-27 DIAGNOSIS — J019 Acute sinusitis, unspecified: Secondary | ICD-10-CM

## 2009-05-27 DIAGNOSIS — J309 Allergic rhinitis, unspecified: Secondary | ICD-10-CM | POA: Insufficient documentation

## 2009-06-10 ENCOUNTER — Encounter (INDEPENDENT_AMBULATORY_CARE_PROVIDER_SITE_OTHER): Payer: Self-pay | Admitting: Internal Medicine

## 2009-06-21 ENCOUNTER — Emergency Department (HOSPITAL_COMMUNITY): Admission: EM | Admit: 2009-06-21 | Discharge: 2009-06-21 | Payer: Self-pay | Admitting: Emergency Medicine

## 2009-07-03 ENCOUNTER — Telehealth: Payer: Self-pay | Admitting: Infectious Disease

## 2009-07-03 ENCOUNTER — Emergency Department (HOSPITAL_COMMUNITY): Admission: EM | Admit: 2009-07-03 | Discharge: 2009-07-03 | Payer: Self-pay | Admitting: Family Medicine

## 2009-07-08 ENCOUNTER — Encounter (INDEPENDENT_AMBULATORY_CARE_PROVIDER_SITE_OTHER): Payer: Self-pay | Admitting: Internal Medicine

## 2009-07-08 ENCOUNTER — Ambulatory Visit: Payer: Self-pay | Admitting: Internal Medicine

## 2009-07-08 LAB — CONVERTED CEMR LAB: Blood Glucose, Fingerstick: 95

## 2009-07-13 ENCOUNTER — Encounter (INDEPENDENT_AMBULATORY_CARE_PROVIDER_SITE_OTHER): Payer: Self-pay | Admitting: Internal Medicine

## 2009-07-13 ENCOUNTER — Ambulatory Visit: Payer: Self-pay | Admitting: Infectious Diseases

## 2009-07-14 DIAGNOSIS — H8110 Benign paroxysmal vertigo, unspecified ear: Secondary | ICD-10-CM | POA: Insufficient documentation

## 2009-07-15 ENCOUNTER — Ambulatory Visit (HOSPITAL_COMMUNITY): Admission: RE | Admit: 2009-07-15 | Discharge: 2009-07-15 | Payer: Self-pay | Admitting: Allergy

## 2009-07-27 ENCOUNTER — Encounter (INDEPENDENT_AMBULATORY_CARE_PROVIDER_SITE_OTHER): Payer: Self-pay | Admitting: Internal Medicine

## 2009-08-03 ENCOUNTER — Telehealth (INDEPENDENT_AMBULATORY_CARE_PROVIDER_SITE_OTHER): Payer: Self-pay | Admitting: Internal Medicine

## 2009-08-03 ENCOUNTER — Ambulatory Visit: Payer: Self-pay | Admitting: Internal Medicine

## 2009-08-04 ENCOUNTER — Telehealth (INDEPENDENT_AMBULATORY_CARE_PROVIDER_SITE_OTHER): Payer: Self-pay | Admitting: Internal Medicine

## 2009-08-04 ENCOUNTER — Telehealth: Payer: Self-pay | Admitting: Licensed Clinical Social Worker

## 2009-08-06 ENCOUNTER — Ambulatory Visit: Payer: Self-pay | Admitting: Internal Medicine

## 2009-08-13 ENCOUNTER — Telehealth (INDEPENDENT_AMBULATORY_CARE_PROVIDER_SITE_OTHER): Payer: Self-pay | Admitting: Internal Medicine

## 2009-08-27 ENCOUNTER — Encounter: Payer: Self-pay | Admitting: Licensed Clinical Social Worker

## 2009-09-03 ENCOUNTER — Encounter: Payer: Self-pay | Admitting: Licensed Clinical Social Worker

## 2009-09-08 ENCOUNTER — Telehealth (INDEPENDENT_AMBULATORY_CARE_PROVIDER_SITE_OTHER): Payer: Self-pay | Admitting: Internal Medicine

## 2009-09-18 ENCOUNTER — Telehealth: Payer: Self-pay | Admitting: Licensed Clinical Social Worker

## 2009-09-28 ENCOUNTER — Telehealth (INDEPENDENT_AMBULATORY_CARE_PROVIDER_SITE_OTHER): Payer: Self-pay | Admitting: Internal Medicine

## 2009-10-06 ENCOUNTER — Ambulatory Visit: Payer: Self-pay | Admitting: Internal Medicine

## 2009-10-06 ENCOUNTER — Encounter (INDEPENDENT_AMBULATORY_CARE_PROVIDER_SITE_OTHER): Payer: Self-pay | Admitting: Internal Medicine

## 2009-10-06 ENCOUNTER — Encounter: Payer: Self-pay | Admitting: Licensed Clinical Social Worker

## 2009-10-17 DIAGNOSIS — M545 Low back pain, unspecified: Secondary | ICD-10-CM

## 2009-10-17 HISTORY — DX: Low back pain, unspecified: M54.50

## 2009-10-21 ENCOUNTER — Telehealth: Payer: Self-pay | Admitting: Licensed Clinical Social Worker

## 2009-11-02 ENCOUNTER — Telehealth (INDEPENDENT_AMBULATORY_CARE_PROVIDER_SITE_OTHER): Payer: Self-pay | Admitting: *Deleted

## 2009-11-06 ENCOUNTER — Ambulatory Visit: Payer: Self-pay | Admitting: Internal Medicine

## 2009-11-24 ENCOUNTER — Encounter (INDEPENDENT_AMBULATORY_CARE_PROVIDER_SITE_OTHER): Payer: Self-pay | Admitting: Internal Medicine

## 2009-12-08 ENCOUNTER — Ambulatory Visit (HOSPITAL_COMMUNITY): Admission: RE | Admit: 2009-12-08 | Discharge: 2009-12-08 | Payer: Self-pay | Admitting: Family Medicine

## 2009-12-18 ENCOUNTER — Telehealth: Payer: Self-pay | Admitting: Internal Medicine

## 2009-12-19 ENCOUNTER — Encounter (INDEPENDENT_AMBULATORY_CARE_PROVIDER_SITE_OTHER): Payer: Self-pay | Admitting: Internal Medicine

## 2009-12-24 ENCOUNTER — Encounter: Payer: Self-pay | Admitting: Licensed Clinical Social Worker

## 2009-12-31 ENCOUNTER — Telehealth (INDEPENDENT_AMBULATORY_CARE_PROVIDER_SITE_OTHER): Payer: Self-pay | Admitting: Internal Medicine

## 2010-01-14 ENCOUNTER — Telehealth (INDEPENDENT_AMBULATORY_CARE_PROVIDER_SITE_OTHER): Payer: Self-pay | Admitting: Internal Medicine

## 2010-01-21 ENCOUNTER — Encounter (INDEPENDENT_AMBULATORY_CARE_PROVIDER_SITE_OTHER): Payer: Self-pay | Admitting: Internal Medicine

## 2010-02-02 ENCOUNTER — Ambulatory Visit: Payer: Self-pay | Admitting: Internal Medicine

## 2010-02-02 LAB — CONVERTED CEMR LAB: Blood Glucose, Fingerstick: 159

## 2010-02-03 ENCOUNTER — Encounter (INDEPENDENT_AMBULATORY_CARE_PROVIDER_SITE_OTHER): Payer: Self-pay | Admitting: Internal Medicine

## 2010-02-09 LAB — CONVERTED CEMR LAB
ALT: 15 units/L (ref 0–35)
Albumin: 4 g/dL (ref 3.5–5.2)
Alkaline Phosphatase: 80 units/L (ref 39–117)
CO2: 25 meq/L (ref 19–32)
Cholesterol: 151 mg/dL (ref 0–200)
Glucose, Bld: 146 mg/dL — ABNORMAL HIGH (ref 70–99)
LDL Cholesterol: 75 mg/dL (ref 0–99)
Potassium: 4.1 meq/L (ref 3.5–5.3)
Sodium: 142 meq/L (ref 135–145)
Total Protein: 6.8 g/dL (ref 6.0–8.3)
Triglycerides: 136 mg/dL (ref <150)
VLDL: 27 mg/dL (ref 0–40)

## 2010-02-15 ENCOUNTER — Telehealth: Payer: Self-pay | Admitting: Licensed Clinical Social Worker

## 2010-02-15 ENCOUNTER — Telehealth (INDEPENDENT_AMBULATORY_CARE_PROVIDER_SITE_OTHER): Payer: Self-pay | Admitting: Internal Medicine

## 2010-02-17 ENCOUNTER — Encounter: Payer: Self-pay | Admitting: Licensed Clinical Social Worker

## 2010-03-31 ENCOUNTER — Telehealth (INDEPENDENT_AMBULATORY_CARE_PROVIDER_SITE_OTHER): Payer: Self-pay | Admitting: Internal Medicine

## 2010-04-01 ENCOUNTER — Telehealth: Payer: Self-pay | Admitting: *Deleted

## 2010-04-07 ENCOUNTER — Emergency Department (HOSPITAL_COMMUNITY): Admission: EM | Admit: 2010-04-07 | Discharge: 2010-04-07 | Payer: Self-pay | Admitting: Emergency Medicine

## 2010-04-14 ENCOUNTER — Telehealth: Payer: Self-pay | Admitting: Licensed Clinical Social Worker

## 2010-04-27 ENCOUNTER — Telehealth: Payer: Self-pay | Admitting: Internal Medicine

## 2010-05-14 ENCOUNTER — Encounter: Payer: Self-pay | Admitting: Internal Medicine

## 2010-05-19 ENCOUNTER — Encounter: Admission: RE | Admit: 2010-05-19 | Discharge: 2010-05-19 | Payer: Self-pay | Admitting: Neurology

## 2010-06-14 ENCOUNTER — Telehealth: Payer: Self-pay | Admitting: Internal Medicine

## 2010-06-29 ENCOUNTER — Telehealth: Payer: Self-pay | Admitting: Licensed Clinical Social Worker

## 2010-07-05 ENCOUNTER — Emergency Department (HOSPITAL_COMMUNITY): Admission: EM | Admit: 2010-07-05 | Discharge: 2010-07-05 | Payer: Self-pay | Admitting: Emergency Medicine

## 2010-07-05 ENCOUNTER — Telehealth: Payer: Self-pay | Admitting: *Deleted

## 2010-07-07 ENCOUNTER — Telehealth: Payer: Self-pay | Admitting: Licensed Clinical Social Worker

## 2010-07-09 ENCOUNTER — Encounter: Payer: Self-pay | Admitting: Licensed Clinical Social Worker

## 2010-08-02 ENCOUNTER — Telehealth: Payer: Self-pay | Admitting: Internal Medicine

## 2010-08-09 ENCOUNTER — Encounter: Payer: Self-pay | Admitting: Internal Medicine

## 2010-08-19 ENCOUNTER — Telehealth (INDEPENDENT_AMBULATORY_CARE_PROVIDER_SITE_OTHER): Payer: Self-pay | Admitting: *Deleted

## 2010-09-04 ENCOUNTER — Emergency Department (HOSPITAL_COMMUNITY): Admission: EM | Admit: 2010-09-04 | Discharge: 2010-09-05 | Payer: Self-pay | Admitting: Emergency Medicine

## 2010-09-07 ENCOUNTER — Ambulatory Visit: Payer: Self-pay | Admitting: Internal Medicine

## 2010-09-07 LAB — CONVERTED CEMR LAB
Blood Glucose, Fingerstick: 218
TSH: 2.92 microintl units/mL (ref 0.350–4.5)

## 2010-09-15 ENCOUNTER — Telehealth (INDEPENDENT_AMBULATORY_CARE_PROVIDER_SITE_OTHER): Payer: Self-pay | Admitting: *Deleted

## 2010-09-24 ENCOUNTER — Telehealth: Payer: Self-pay | Admitting: Internal Medicine

## 2010-09-27 ENCOUNTER — Telehealth: Payer: Self-pay | Admitting: *Deleted

## 2010-09-30 ENCOUNTER — Telehealth (INDEPENDENT_AMBULATORY_CARE_PROVIDER_SITE_OTHER): Payer: Self-pay | Admitting: *Deleted

## 2010-10-06 ENCOUNTER — Telehealth: Payer: Self-pay | Admitting: Internal Medicine

## 2010-10-19 ENCOUNTER — Ambulatory Visit: Admit: 2010-10-19 | Payer: Self-pay

## 2010-10-29 ENCOUNTER — Telehealth: Payer: Self-pay | Admitting: Internal Medicine

## 2010-11-07 ENCOUNTER — Encounter: Payer: Self-pay | Admitting: *Deleted

## 2010-11-16 NOTE — Progress Notes (Signed)
Summary: Soc. Work  Programme researcher, broadcasting/film/video of Call: Return [name] Rice's call to schedule an appmt.  Left message.

## 2010-11-16 NOTE — Progress Notes (Signed)
Summary: REfill/gh  Phone Note Refill Request Message from:  Pharmacy on December 18, 2009 11:20 AM  Refills Requested: Medication #1:  LYRICA 100 MG CAPS Take one tablet three times a day for foot pain.   Last Refilled: 11/02/2009  Method Requested: Fax to Local Pharmacy Initial call taken by: Angelina Ok RN,  December 18, 2009 11:20 AM  Follow-up for Phone Call        I called pt.  Will do 50 mg AM, 50 afternoon,and 100 mg (2 pills) at bedtime.  [facility].   Follow-up by: Blanch Media MD,  December 18, 2009 5:08 PM    New/Updated Medications: LYRICA 50 MG CAPS (PREGABALIN) one pill in the morning.  one pill in the afternoon.  two pills at bedtime. Prescriptions: LYRICA 50 MG CAPS (PREGABALIN) one pill in the morning.  one pill in the afternoon.  two pills at bedtime.  #124 x 2   Entered and Authorized by:   Blanch Media MD   Signed by:   Blanch Media MD on 12/18/2009   Method used:   Printed then faxed to ...       [id number]       [address]       [place], [place]  [address]       Ph: [phone] or [phone]       Fax: [phone]   RxID:   [id number]

## 2010-11-16 NOTE — Miscellaneous (Signed)
Summary: [facility]: ResScan Result  [facility]: [facility]Scan Result   Imported By: Florinda Marker 11/25/2009 14:09:51  _____________________________________________________________________  External Attachment:    Type:   Image     Comment:   External Document

## 2010-11-16 NOTE — Progress Notes (Signed)
Summary: refill/gg  Phone Note Refill Request  on August 02, 2010 4:59 PM  Refills Requested: Medication #1:  SINGULAIR 10 MG TABS Take 1 tablet by mouth once daily   Last Refilled: 04/27/2010 also wants nasonex 50 mcg nasal spray   Method Requested: Electronic Initial call taken by: Merrie Roof RN,  August 02, 2010 4:59 PM  Follow-up for Phone Call        Has Nov 1st appt.  I could not find Nasonex on EMR. Has been alot of nose sprays but most recnet;y changed to astepro. Follow-up by: Blanch Media MD,  August 02, 2010 5:06 PM    Prescriptions: SINGULAIR 10 MG TABS (MONTELUKAST SODIUM) Take 1 tablet by mouth once daily  #96 x 0   Entered and Authorized by:   Blanch Media MD   Signed by:   Blanch Media MD on 08/02/2010   Method used:   Electronically to        [id number]       [address]       [place], [place]  [address]       Ph: [phone] or [phone]       Fax: [phone]   RxID:   [id number]

## 2010-11-16 NOTE — Assessment & Plan Note (Signed)
Summary: Soc. Work  Soc. Work.  60 minutes.   Counseling and support.

## 2010-11-16 NOTE — Consult Note (Signed)
[facility]   Imported By: Louretta Parma 05/31/2010 12:16:07  _____________________________________________________________________  External Attachment:    Type:   Image     Comment:   External Document

## 2010-11-16 NOTE — Progress Notes (Signed)
Summary: Soc. 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  Nayara informed me that she fell on June 22nd in her bedroom due to wearing slippery slippers.  She went to [facility] ED and they prescribed Vicodin which is helping somewhat.  Marjory would like to be seen in clinic to reevaluate the Lyrica which she believes was partly to blame for her falling as it makes her drowsy during the day but does help her sleep at night.  Lavra called a few days ago to make an appmt and front office said that she would have to wait two to three weeks to schedule with her new doctor.  I told Hulda I would forward to Triage and let them know that she would like to be seen by a physician asap.    Follow-up for Phone Call        appointment given for July 1 Pt aware Follow-up by: Merrie Roof RN,  April 14, 2010 4:32 PM

## 2010-11-16 NOTE — Assessment & Plan Note (Signed)
Summary: est-ck/fu/meds/cfb   Vital Signs:  Patient profile:   55 year old female Height:      64.75 inches (164.47 cm) Weight:      252.3 pounds (114.68 kg) BMI:     42.46 Temp:     97.6 degrees F (36.44 degrees C) oral Pulse rate:   61 / minute BP sitting:   118 / 70  (right arm)  Vitals Entered By: Blenda Mounts (February 02, 2010 3:24 PM)/Glenda Palmer RN CC: follow-up visit Is Patient Diabetic? Yes Did you bring your meter with you today? Yes Pain Assessment Patient in pain? yes     Location: back Intensity: 9 Type: sharp Onset of pain  intermittent, standing, and movement increases pain CBG Result 159  Have you ever been in a relationship where you felt threatened, hurt or afraid?No   Does patient need assistance? Functional Status Self care Ambulation Impaired:Risk for fall Comments walks with cane   Primary Care Provider:  Elby Showers MD  CC:  follow-up visit.  History of Present Illness: 53yof with pmh outlined in this chart here for check up.  She reports that she has been doing fairly well.  She has been out shopping for new furniture and is re-doing her living room.  The driving and walking has made her back hurt.  She has been spending a lot of time with her family and is expecting her first great grand child any day!  She is looking forward [place] going [place].   No specific complaints.  She has been trying to get on a more regular sleep schedule.  She is monitoring her cbg's and taking her medications as prescribed.   Depression History:      The patient notes a diminished interest in her usual daily activities.        The patient denies that she feels like life is not worth living, denies that she wishes that she were dead, and denies that she has thought about ending her life.        Comments:  Lorri Frederick. (social woker) is helping her deal with her depression.   Preventive Screening-Counseling &  Management  Alcohol-Tobacco     Alcohol drinks/day: 0     Smoking Status: quit     Year Quit: 1998     Pack years: 180     Passive Smoke Exposure: no  Caffeine-Diet-Exercise     Caffeine use/day: 0     Does Patient Exercise: yes     Type of exercise: WALKING     Times/week: 2  Current Medications (verified): 1)  Lantus 100 Unit/ml  Soln (Insulin Glargine) .... Inject 70 Units Once A Day. 2)  Advair Diskus 250-50 Mcg/dose Misc (Fluticasone-Salmeterol) .... Inhale One Puff Two Times A Day 3)  Metformin Hcl 1000 Mg Tabs (Metformin Hcl) .... Take 1 Tablet By Mouth Two Times A Day 4)  Allopurinol 300 Mg Tabs (Allopurinol) .... Take One Tablet By Mouth Once Daily 5)  Singulair 10 Mg Tabs (Montelukast Sodium) .... Take 1 Tablet By Mouth Once Daily 6)  Celebrex 200 Mg Caps (Celecoxib) .... Take 1 Capsule By Mouth Once Daily 7)  Cymbalta 60 Mg Cpep (Duloxetine Hcl) .... Take One Tablet Daily, Also Take A 20mg  Tablet. 8)  Pravachol 40 Mg Tabs (Pravastatin Sodium) .... Take One Tablet Daily. 9)  Bd Eclipse Syringe 30g X 1/2" 1 Ml  Misc (Syringe/needle (Disp)) .... Please Check Your Sugar  With Every Meal and Before Bed 10)  Ventolin Hfa 108 (90 Base) Mcg/act  Aers (Albuterol Sulfate) .... Inhale 1-2 Puffs Once Every 4-6 Hours As Needed For Shortness of Breath 11)  Novolog 100 Unit/ml Soln (Insulin Aspart) .... Inject 40 Units Into Your Skin Three Times A Day With Meals.  Please Space These Injections Out By At [facility] 4-5 Hours. 12)  Protonix 40 Mg Pack (Pantoprazole Sodium) .... Take One Tablet Daily. 13)  Triamcinolone Acetonide 0.5 % Crea (Triamcinolone Acetonide) .... Applie Topically 1-2 Time A Day 14)  Patanol 0.1 % Soln (Olopatadine Hcl) .... One Drop in Each Eye Two Times A Day 15)  Inderal La 60 Mg Xr24h-Cap (Propranolol Hcl) .Marland Kitchen.. 1 By Mouth Three Times A Day 16)  Cymbalta 20 Mg Cpep (Duloxetine Hcl) .... Take One Tablet Daily, in Addition To A 60mg  Tablet. 17)  Furosemide 40 Mg Tabs  (Furosemide) .... Take 1 Tablet By Mouth Two Times A Day 18)  Ambien 10 Mg Tabs (Zolpidem Tartrate) .... Take 1 Tab By Mouth At Bedtime 19)  Onetouch Test  Strp (Glucose Blood) .... Check Blood Sugars Three Times Daily. 20)  Topamax 25 Mg Tabs (Topiramate) .... Three Tablets At Bedtime 21)  Fexofenadine Hcl 180 Mg Tabs (Fexofenadine Hcl) .Marland Kitchen.. 1 Tab Daily 22)  Astepro 0.15 % Soln (Azelastine Hcl) 23)  Meclizine Hcl 25 Mg Tabs (Meclizine Hcl) .... Take 1 Tablet By Mouth Once A Day 24)  Lyrica 100 Mg Caps (Pregabalin) .... Take One Tablet Two Times A Day.  Allergies: 1)  ! Sulfa 2)  ! Ceclor 3)  ! Ace Inhibitors 4)  ! * Seafood/shrimp  Review of Systems       per hpi  Physical Exam  General:  alert and overweight-appearing.   Head:  normocephalic and atraumatic.   Mouth:  pharynx pink and moist.   Lungs:  normal respiratory effort and normal breath sounds.   Heart:  normal rate, regular rhythm, no murmur, and no gallop.   Pulses:  +1 Extremities:  no edema Neurologic:  alert & oriented X3, cranial nerves II-XII intact, and gait normal.   Psych:  Oriented X3, memory intact for recent and remote, normally interactive, good eye contact, and not anxious appearing.     Impression & Recommendations:  Problem # 1:  DIABETES MELLITUS, TYPE II (ICD-250.00) A1C doing fine at 6.9. Will not make any changes.  Her updated medication list for this problem includes:    Lantus 100 Unit/ml Soln (Insulin glargine) ..... Inject 70 units once a day.    Metformin Hcl 1000 Mg Tabs (Metformin hcl) .Marland Kitchen... Take 1 tablet by mouth two times a day    Novolog 100 Unit/ml Soln (Insulin aspart) ..... Inject 40 units into your skin three times a day with meals.  please space these injections out by at least 4-5 hours.  Orders: T- Capillary Blood Glucose (82948) T-Hgb A1C (in-house) ([id number])  Labs Reviewed: Creat: 1.10 (03/25/2009)     Last Eye Exam: Mild non-proliferative diabetic retinopathy.     Visual acuity OD (best corrected):     20/25 Visual acuity OS (best corrected):     20/25 Intraocular pressure OD:     22 Intraocular pressure OS:     18  (08/14/2007) Reviewed HgBA1c results: 6.9 (02/02/2010)  6.7 (11/06/2009)  Problem # 2:  DYSLIPIDEMIA (ICD-272.4) will check lipids and cmet today.   Her updated medication list for this problem includes:    Pravachol 40 Mg Tabs (Pravastatin sodium) .Marland KitchenMarland KitchenMarland KitchenMarland Kitchen  Take one tablet daily.  Labs Reviewed: SGOT: 15 (03/25/2009)   SGPT: 18 (03/25/2009)  Prior 10 Yr Risk Heart Disease: 11 % (08/03/2009)   HDL:49 (03/25/2009), 49 (10/02/2008)  LDL:105 (03/25/2009), 99 (64/40/3474)  Chol:189 (03/25/2009), 173 (10/02/2008)  Trig:176 (03/25/2009), 127 (10/02/2008)  Orders: T-Comprehensive Metabolic Panel ([id number]) T-Lipid Profile ([id number])  Problem # 3:  HYPERTENSION (ICD-401.9) BP controled no changes  Her updated medication list for this problem includes:    Inderal La 60 Mg Xr24h-cap (Propranolol hcl) .Marland Kitchen... 1 by mouth three times a day    Furosemide 40 Mg Tabs (Furosemide) .Marland Kitchen... Take 1 tablet by mouth two times a day  BP today: 118/70 Prior BP: 108/68 (11/06/2009)  Prior 10 Yr Risk Heart Disease: 11 % (08/03/2009)  Labs Reviewed: K+: 4.3 (03/25/2009) Creat: : 1.10 (03/25/2009)   Chol: 189 (03/25/2009)   HDL: 49 (03/25/2009)   LDL: 105 (03/25/2009)   TG: 176 (03/25/2009)  Problem # 4:  DEPRESSION (ICD-311) Seems to have been doing well.  Is furniture shopping and riding around town, redoing her living room. Spending time with family and looking forward to first great grand child.   Her updated medication list for this problem includes:    Cymbalta 60 Mg Cpep (Duloxetine hcl) .Marland Kitchen... Take one tablet daily, also take a 20mg  tablet.    Cymbalta 20 Mg Cpep (Duloxetine hcl) .Marland Kitchen... Take one tablet daily, in addition to a 60mg  tablet.  Problem # 5:  PERIPHERAL NEUROPATHY (ICD-356.9) needs to have lyrica prescription changed for it  to be covered. I have done this today.  Problem # 6:  COPD (ICD-496) No difficulty with breathing.  Her updated medication list for this problem includes:    Advair Diskus 250-50 Mcg/dose Misc (Fluticasone-salmeterol) ..... Inhale one puff two times a day    Singulair 10 Mg Tabs (Montelukast sodium) .Marland Kitchen... Take 1 tablet by mouth once daily    Ventolin Hfa 108 (90 Base) Mcg/act Aers (Albuterol sulfate) ..... Inhale 1-2 puffs once every 4-6 hours as needed for shortness of breath  Complete Medication List: 1)  Lantus 100 Unit/ml Soln (Insulin glargine) .... Inject 70 units once a day. 2)  Advair Diskus 250-50 Mcg/dose Misc (Fluticasone-salmeterol) .... Inhale one puff two times a day 3)  Metformin Hcl 1000 Mg Tabs (Metformin hcl) .... Take 1 tablet by mouth two times a day 4)  Allopurinol 300 Mg Tabs (Allopurinol) .... Take one tablet by mouth once daily 5)  Singulair 10 Mg Tabs (Montelukast sodium) .... Take 1 tablet by mouth once daily 6)  Celebrex 200 Mg Caps (Celecoxib) .... Take 1 capsule by mouth once daily 7)  Cymbalta 60 Mg Cpep (Duloxetine hcl) .... Take one tablet daily, also take a 20mg  tablet. 8)  Pravachol 40 Mg Tabs (Pravastatin sodium) .... Take one tablet daily. 9)  Bd Eclipse Syringe 30g X 1/2" 1 Ml Misc (Syringe/needle (disp)) .... Please check your sugar with every meal and before bed 10)  Ventolin Hfa 108 (90 Base) Mcg/act Aers (Albuterol sulfate) .... Inhale 1-2 puffs once every 4-6 hours as needed for shortness of breath 11)  Novolog 100 Unit/ml Soln (Insulin aspart) .... Inject 40 units into your skin three times a day with meals.  please space these injections out by at least 4-5 hours. 12)  Protonix 40 Mg Pack (Pantoprazole sodium) .... Take one tablet daily. 13)  Triamcinolone Acetonide 0.5 % Crea (Triamcinolone acetonide) .... Applie topically 1-2 time a day 14)  Patanol 0.1 % Soln (Olopatadine hcl) .Marland KitchenMarland KitchenMarland Kitchen  One drop in each eye two times a day 15)  Inderal La 60 Mg  Xr24h-cap (Propranolol hcl) .Marland Kitchen.. 1 by mouth three times a day 16)  Cymbalta 20 Mg Cpep (Duloxetine hcl) .... Take one tablet daily, in addition to a 60mg  tablet. 17)  Furosemide 40 Mg Tabs (Furosemide) .... Take 1 tablet by mouth two times a day 18)  Ambien 10 Mg Tabs (Zolpidem tartrate) .... Take 1 tab by mouth at bedtime 19)  Onetouch Test Strp (Glucose blood) .... Check blood sugars three times daily. 20)  Topamax 25 Mg Tabs (Topiramate) .... Three tablets at bedtime 21)  Fexofenadine Hcl 180 Mg Tabs (Fexofenadine hcl) .Marland Kitchen.. 1 tab daily 22)  Astepro 0.15 % Soln (Azelastine hcl) 23)  Meclizine Hcl 25 Mg Tabs (Meclizine hcl) .... Take 1 tablet by mouth once a day 24)  Lyrica 100 Mg Caps (Pregabalin) .... Take one tablet two times a day.  Patient Instructions: 1)  Please schedule a follow-up appointment in 3 months. 2)  You had labwork done today, we will call you if there is anything that needs to be addressed before your next appointment. 3)  You are doing great!  Keep getting out and exercising! Prescriptions: LYRICA 100 MG CAPS (PREGABALIN) Take one tablet two times a day.  #64 x 6   Entered and Authorized by:   Elby Showers MD   Signed by:   Elby Showers MD on 02/02/2010   Method used:   Print then Give to Patient   RxID:   [id number]  Process Orders Check Orders Results:     [facility]: Check successful Tests Sent for requisitioning (February 04, 2010 8:43 AM):     02/02/2010: [facility] -- T-Comprehensive Metabolic Panel [[id number]] (signed)     02/02/2010: [facility] -- T-Lipid Profile [phone] (signed)  [facility]; pt. was called and made awared. Chinita Pester RN  February 03, 2010 11:43 AM   Prevention & Chronic Care Immunizations   Influenza vaccine: Fluvax MCR  (08/06/2009)    Tetanus booster: Not documented    Pneumococcal vaccine: Not documented  Colorectal  Screening   Hemoccult: Not documented    Colonoscopy: Location:  [facility].    (10/20/2008)   Colonoscopy action/deferral: Repeat colonoscopy in 5 years.   (02/12/2003)   Colonoscopy due: 10/20/2013  Other Screening   Pap smear: Not documented   Pap smear due: 05/08/2010    Mammogram: ASSESSMENT: Negative - BI-RADS 1^MM DIGITAL SCREENING  (12/08/2009)   Mammogram action/deferral: Ordered  (11/06/2009)   Mammogram due: 08/05/2010   Smoking status: quit  (02/02/2010)  Diabetes Mellitus   HgbA1C: 6.9  (02/02/2010)   HgbA1C action/deferral: Ordered  (08/03/2009)    Eye exam: Mild non-proliferative diabetic retinopathy.   Visual acuity OD (best corrected):     20/25 Visual acuity OS (best corrected):     20/25 Intraocular pressure OD:     22 Intraocular pressure OS:     18   (08/14/2007)   Diabetic eye exam action/deferral: Ophthalmology referral  (11/06/2009)   Eye exam due: 08/2008    Foot exam: yes  (03/25/2009)   High risk foot: Not documented   Foot care education: Done  (03/25/2009)    Urine microalbumin/creatinine ratio: 24.4  (05/08/2009)   Urine microalbumin action/deferral: Ordered    Diabetes flowsheet reviewed?: Yes   Progress toward A1C goal: At goal  Lipids   Total Cholesterol: 189  (03/25/2009)   LDL:  105  (03/25/2009)   LDL Direct: Not documented   HDL: 49  (03/25/2009)   Triglycerides: 176  (03/25/2009)    SGOT (AST): 15  (03/25/2009)   SGPT (ALT): 18  (03/25/2009) CMP ordered    Alkaline phosphatase: 92  (03/25/2009)   Total bilirubin: 0.3  (03/25/2009)    Lipid flowsheet reviewed?: Yes   Progress toward LDL goal: Unchanged  Hypertension   Last Blood Pressure: 118 / 70  (02/02/2010)   Serum creatinine: 1.10  (03/25/2009)   Serum potassium 4.3  (03/25/2009) CMP ordered     Hypertension flowsheet reviewed?: Yes   Progress toward BP goal: At goal  Self-Management Support :   Personal Goals (by the next clinic visit) :      Personal A1C goal: 7  (07/13/2009)     Personal blood pressure goal: 130/80  (07/13/2009)     Personal LDL goal: 100  (07/13/2009)    Patient will work on the following items until the next clinic visit to reach self-care goals:     Medications and monitoring: take my medicines every day, check my blood sugar, bring all of my medications to every visit, examine my feet every day  (02/02/2010)     Eating: drink diet soda or water instead of juice or soda, eat more vegetables, use fresh or frozen vegetables, eat foods that are low in salt, eat baked foods instead of fried foods  (02/02/2010)     Activity: take a 30 minute walk every day  (11/06/2009)    Diabetes self-management support: CBG self-monitoring log, Written self-care plan, Education handout, Resources for patients handout  (02/02/2010)   Diabetes care plan printed   Diabetes education handout printed   Last diabetes self-management training by diabetes educator: 01/03/2008   Last medical nutrition therapy: 05/28/2008    Hypertension self-management support: Written self-care plan, Education handout, Resources for patients handout  (02/02/2010)   Hypertension self-care plan printed.   Hypertension education handout printed    Lipid self-management support: Written self-care plan, Education handout, Resources for patients handout  (02/02/2010)   Lipid self-care plan printed.   Lipid education handout printed      Resource handout printed.   Laboratory Results   Blood Tests   Date/Time Received: February 02, 2010 4:06 PM  Date/Time Reported: Burke Keels  February 02, 2010 4:06 PM   HGBA1C: 6.9%   (Normal Range: Non-Diabetic - 3-6%   Control Diabetic - 6-8%) CBG Random:: 159mg /dL

## 2010-11-16 NOTE — Progress Notes (Signed)
Summary: refill/ hla  Phone Note Refill Request Message from:  Fax from Pharmacy on March 31, 2010 9:27 AM  Refills Requested: Medication #1:  AMBIEN 10 MG TABS Take 1 tab by mouth at bedtime   Last Refilled: 4/30 Initial call taken by: Marin Roberts RN,  March 31, 2010 9:27 AM  Follow-up for Phone Call        Refill approved-nurse to complete    Prescriptions: AMBIEN 10 MG TABS (ZOLPIDEM TARTRATE) Take 1 tab by mouth at bedtime  #30 x 3   Entered and Authorized by:   Elby Showers MD   Signed by:   Elby Showers MD on 04/01/2010   Method used:   Telephoned to ...       [id number]       [address]       [place], [place]  [address]       Ph: [phone] or [phone]       Fax: [phone]   RxID:   [id number]   Appended Document: refill/ hla Ambien Rx [facility]

## 2010-11-16 NOTE — Assessment & Plan Note (Signed)
Summary: Social Work  Soc. Work.  Counseling and Support.  90 minutes.   Appended Document: Social Work 02/17/10 Social Work. [name] Rice and I have been meeting on a regular basis to help her better deal with her depression, feelings of loneliness, and familial issues.   [name] Rice continues to worry about her siblings and sister in law being angry at her and not speaking with her over the last two months.  Recently she called her sister several times but she never called [name] back.  She then called her sister at work and her sister said she did not have time to speak with [name]. This is upsetting for [name] Rice.   We discussed possible reasons why her family would not be speaking to her.  [name] Rice thinks it's because she told her sister in law how she felt about the distribution of her stepfather's estate and how unfair it was to leave her out of the will. I commended [name] Rice for speaking up for herself and affirmed that she did nothing wrong in voicing her opinion, but obviously her sister in law has rationalized why she got the highest share out of anyone in the family. We also did discuss that her sister is bipolar and so she may be taking her moods out on [name] Rice.   Encouraged [name] Rice to focus on her own family which she is doing a very good job.  There is a great grand baby that is expected in the summer and [name] Rice is busy knitting a blanket and preparing for that birth.  [name] Rice recently inherited about $5,000 from her stepfather's insurance policy and most of that money has been spent on furniture and buying gifts for her daughter and grandchildren. I commented to [name] Rice that she is a very generous person. However, she has about $800 of that money left and I encouraged her to put that in an emergency account in case her car breaks down or she has an unforeseen medical expense.   Social Work will meet with [name] on an as needed basis.

## 2010-11-16 NOTE — Letter (Signed)
Summary: ONE TOUCH   ONE TOUCH   Imported By: Margie Billet 09/13/2010 09:28:57  _____________________________________________________________________  External Attachment:    Type:   Image     Comment:   External Document

## 2010-11-16 NOTE — Progress Notes (Signed)
Summary: [facility]/ hla  Phone Note Call from Patient   Summary of Call: pt calls and states she thinks she has ear infections wants appt this pm, offered wed appt, pt refuses and states she will go to [facility], reminded to keep her nov appt since last 2 appts were no shows, states she will. Initial call taken by: Marin Roberts RN,  July 05, 2010 12:34 PM
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
## 2010-11-16 NOTE — Medication Information (Signed)
Summary: Shelly Bombard Plan: RX  AARP MedicareRX Plan: RX   Imported By: Florinda Marker 01/06/2010 10:39:28  _____________________________________________________________________  External Attachment:    Type:   Image     Comment:   External Document

## 2010-11-16 NOTE — Progress Notes (Signed)
Summary: Soc. Work  Programme researcher, broadcasting/film/video of Call: Left message for Jazyiah to call Soc. Work      Appended Document: Soc. Work Monta said she was not doing well.  She is back to her old sleeping habits/excessively sleeping throughout the day.  She is dealing with kidney stones.  She will call me by the end of the week to schedule a counseling session.

## 2010-11-16 NOTE — Assessment & Plan Note (Signed)
Summary: Soc. Work  Social Work.  Counseling and Support. 75 minutes.  Dela has had a setback in terms of her family and mental health.  Her stepfather recently died. She had become quite close to him over the last year or so visiting him frequently at the [facility].  She recently learned that she was the only living sibling to be left out of his will and she is extremly tearful and upset today while telling me the news because she thought that she and her stepfather were close.  It appears that the will was changed about 4 to 5 years ago right after her mother died and then never updated.    Lillianah tells me that she is having difficulties speaking with her sister-in-law, brother, and sister and feels like she has made a mess of things.   Most of the time today was spent getting Aiyla in a better frame of mind and allowing her to see that none of this was her fault or caused by her. We also engaged in some problem-solving as to how she might handle her family.   Kadiatou left the session today and reported that it had been helpful to her today.  Cont SW follow-up.

## 2010-11-16 NOTE — Progress Notes (Signed)
  Phone Note Other Incoming   Caller: Patient Summary of Call: [name] Rice called to let me know that she was doing well.  She had a wonderful New Years eve with her brother and father.  She is keeping busy after the holidays.  She also said she is sleeping better now that she's on Lyrica which has completely taken care of the pain from her neuropathy.   She also has more energy.  I thanked her for calling me to let me know and encouraged her to make an appointment with me when she needs to.

## 2010-11-16 NOTE — Progress Notes (Signed)
Summary: refill/gg  Phone Note Refill Request  on November 02, 2009 4:23 PM  Refills Requested: Medication #1:  LYRICA 100 MG CAPS Take one tablet three times a day for foot pain..   Last Refilled: 10/06/2009 Needs # 90 a month   **  pt is out of med because she takes three times a day and was only given # 60.  Initial call taken by: Merrie Roof RN,  November 02, 2009 4:23 PM  Follow-up for Phone Call        Refill approved-nurse to complete Follow-up by: Julaine Fusi  DO,  November 02, 2009 4:44 PM  Additional Follow-up for Phone Call Additional follow up Details #1::        Rx called to pharmacy Additional Follow-up by: Merrie Roof RN,  November 02, 2009 4:48 PM    Prescriptions: LYRICA 100 MG CAPS (PREGABALIN) Take one tablet three times a day for foot pain.  #90 x 0   Entered and Authorized by:   Julaine Fusi  DO   Signed by:   Julaine Fusi  DO on 11/02/2009   Method used:   Telephoned to ...       [phone]       [address] [place], [place]  [address]       Ph: [phone] or [phone]       Fax: [phone]   RxID:   [id number]

## 2010-11-16 NOTE — Progress Notes (Signed)
Summary: lyrica/ hla  Phone Note Call from Patient   Caller: Patient Summary of Call: pt calls to say insurance will not pay for 50mg  lyrica...they come ONLY in capsules (i checked w/ pharm) they will pay for 100mg . please advise. she cannot afford to pay for them Initial call taken by: Marin Roberts RN,  December 31, 2009 5:28 PM  Follow-up for Phone Call        refill aproved. please call in. thank you.    New/Updated Medications: LYRICA 100 MG CAPS (PREGABALIN) Take one tablet daily. Prescriptions: LYRICA 100 MG CAPS (PREGABALIN) Take one tablet daily.  #32 x 1   Entered and Authorized by:   Elby Showers MD   Signed by:   Elby Showers MD on 01/07/2010   Method used:   Telephoned to ...       [phone][facility] (retail)       [address] [place], [place]  [address]       Ph: [phone] or [phone]       Fax: [phone]   RxID:   [id number]

## 2010-11-16 NOTE — Assessment & Plan Note (Signed)
Summary: F/U/EST/VS   Vital Signs:  Patient profile:   55 year old female Height:      64.75 inches (164.47 cm) Weight:      246.1 pounds (111.86 kg) BMI:     41.42 Temp:     97.1 degrees F (36.17 degrees C) oral Pulse rate:   66 / minute BP sitting:   108 / 68  (left arm)  Vitals Entered By: Stanton Kidney Ditzler RN (November 06, 2009 2:42 PM) Is Patient Diabetic? Yes Did you bring your meter with you today? Yes Pain Assessment Patient in pain? no      Nutritional Status BMI of 25 - 29 = overweight Nutritional Status Detail appetite good CBG Result 102  Have you ever been in a relationship where you felt threatened, hurt or afraid?denies   Does patient need assistance? Functional Status Self care Ambulation Impaired:Risk for fall Comments Uses a cane. FU feet feeling better. Rash on both inner thigh area.   Primary Care Provider:  Elby Showers MD   History of Present Illness: This is a  year old woman with past medical history outlined in this chart.  She is here for follow up of foot pain- neuropathic for which she had been on many meds in the past and had settled on darvocet.  Darvocet is no longer availabe so she is on lyrica and is very happy with this, but feels drunk and is worried about driving.  She is not confused and has had no falls, just feels loopy.  Also here for diabetes apppointment.  She reports that her cbg's at home have been well controled.  Depression History:      The patient denies a depressed mood most of the day and a diminished interest in her usual daily activities.         Preventive Screening-Counseling & Management  Alcohol-Tobacco     Alcohol drinks/day: 0     Smoking Status: quit     Year Quit: 1998     Pack years: 180     Passive Smoke Exposure: no  Caffeine-Diet-Exercise     Caffeine use/day: 0     Does Patient Exercise: yes     Type of exercise: WALKING     Times/week: 2  Current Medications (verified): 1)  Lantus 100  Unit/ml  Soln (Insulin Glargine) .... Inject 70 Units Once A Day. 2)  Advair Diskus 250-50 Mcg/dose Misc (Fluticasone-Salmeterol) .... Inhale One Puff Two Times A Day 3)  Metformin Hcl 1000 Mg Tabs (Metformin Hcl) .... Take 1 Tablet By Mouth Two Times A Day 4)  Allopurinol 300 Mg Tabs (Allopurinol) .... Take One Tablet By Mouth Once Daily 5)  Singulair 10 Mg Tabs (Montelukast Sodium) .... Take 1 Tablet By Mouth Once Daily 6)  Celebrex 200 Mg Caps (Celecoxib) .... Take 1 Capsule By Mouth Once Daily 7)  Cymbalta 60 Mg Cpep (Duloxetine Hcl) .... Take One Tablet Daily, Also Take A 20mg  Tablet. 8)  Pravachol 40 Mg Tabs (Pravastatin Sodium) .... Take One Tablet Daily. 9)  Bd Eclipse Syringe 30g X 1/2" 1 Ml  Misc (Syringe/needle (Disp)) .... Please Check Your Sugar With Every Meal and Before Bed 10)  Ventolin Hfa 108 (90 Base) Mcg/act  Aers (Albuterol Sulfate) .... Inhale 1-2 Puffs Once Every 4-6 Hours As Needed For Shortness of Breath 11)  Novolog 100 Unit/ml Soln (Insulin Aspart) .... Inject 40 Units Into Your Skin Three Times A Day With Meals.  Please Space These Injections Out By  At Least 4-5 Hours. 12)  Protonix 40 Mg Pack (Pantoprazole Sodium) .... Take One Tablet Daily. 13)  Triamcinolone Acetonide 0.5 % Crea (Triamcinolone Acetonide) .... Applie Topically 1-2 Time A Day 14)  Patanol 0.1 % Soln (Olopatadine Hcl) .... One Drop in Each Eye Two Times A Day 15)  Inderal La 60 Mg Xr24h-Cap (Propranolol Hcl) .Marland Kitchen.. 1 By Mouth Three Times A Day 16)  Cymbalta 20 Mg Cpep (Duloxetine Hcl) .... Take One Tablet Daily, in Addition To A 60mg  Tablet. 17)  Furosemide 40 Mg Tabs (Furosemide) .... Take 1 Tablet By Mouth Two Times A Day 18)  Ambien 10 Mg Tabs (Zolpidem Tartrate) .... Take 1 Tab By Mouth At Bedtime 19)  Onetouch Test  Strp (Glucose Blood) .... Check Blood Sugars Three Times Daily. 20)  Topamax 25 Mg Tabs (Topiramate) .... Three Tablets At Bedtime 21)  Fexofenadine Hcl 180 Mg Tabs (Fexofenadine Hcl)  .Marland Kitchen.. 1 Tab Daily 22)  Astepro 0.15 % Soln (Azelastine Hcl) 23)  Meclizine Hcl 25 Mg Tabs (Meclizine Hcl) .... Take 1 Tablet By Mouth Once A Day 24)  Lyrica 100 Mg Caps (Pregabalin) .... Take One Tablet Three Times A Day For Foot Pain.  Allergies: 1)  ! Sulfa 2)  ! Ceclor 3)  ! Ace Inhibitors 4)  ! * Seafood/shrimp  Review of Systems       per hpi  Physical Exam  General:  alert and overweight-appearing.   Head:  normocephalic and atraumatic.   Lungs:  normal respiratory effort and normal breath sounds.   Heart:  normal rate, regular rhythm, and no murmur.   Abdomen:  soft, non-tender, and normal bowel sounds.   Msk:  strength 5/5 gets an and off exam table without assistance Pulses:  +1 Extremities:  no edema Neurologic:  alert & oriented X3, cranial nerves II-XII intact, strength normal in all extremities, and gait normal.   Psych:  Oriented X3, memory intact for recent and remote, normally interactive, and good eye contact.     Impression & Recommendations:  Problem # 1:  PERIPHERAL NEUROPATHY (ICD-356.9) better with lyrica, but is feeling loopy. will 1/2 daytime doses but they are caps and she does not have the $$ for new pills.  For now will rec that she not take the daytime doses at all if she can not lower the dose, she will only take it if she needs it during the day, and then will take one cap at night.  She understands this and agrees.  If she does take the medicatino during the day she understands that she should not drive.  Problem # 2:  DIABETES MELLITUS, TYPE II (ICD-250.00) A1C 6.7. good control no changes.  Her updated medication list for this problem includes:    Lantus 100 Unit/ml Soln (Insulin glargine) ..... Inject 70 units once a day.    Metformin Hcl 1000 Mg Tabs (Metformin hcl) .Marland Kitchen... Take 1 tablet by mouth two times a day    Novolog 100 Unit/ml Soln (Insulin aspart) ..... Inject 40 units into your skin three times a day with meals.  please space these  injections out by at least 4-5 hours.  Orders: T- Capillary Blood Glucose (60454) T-Hgb A1C (in-house) (09811BJ) Ophthalmology Referral (Ophthalmology)  Labs Reviewed: Creat: 1.10 (03/25/2009)     Last Eye Exam: Mild non-proliferative diabetic retinopathy.   Visual acuity OD (best corrected):     20/25 Visual acuity OS (best corrected):     20/25 Intraocular pressure OD:  22 Intraocular pressure OS:     18  (08/14/2007) Reviewed HgBA1c results: 6.7 (11/06/2009)  6.3 (08/03/2009)  Problem # 3:  OBESITY, MORBID (ICD-278.01) discussed exercise, she is interested but notes many barriers and does not seem very motivated.  Ht: 64.75 (11/06/2009)   Wt: 246.1 (11/06/2009)   BMI: 41.42 (11/06/2009)  Problem # 4:  HYPERTENSION (ICD-401.9) BP is great today, no changes.  Her updated medication list for this problem includes:    Inderal La 60 Mg Xr24h-cap (Propranolol hcl) .Marland Kitchen... 1 by mouth three times a day    Furosemide 40 Mg Tabs (Furosemide) .Marland Kitchen... Take 1 tablet by mouth two times a day  BP today: 108/68 Prior BP: 112/67 (10/06/2009)  Prior 10 Yr Risk Heart Disease: 11 % (08/03/2009)  Labs Reviewed: K+: 4.3 (03/25/2009) Creat: : 1.10 (03/25/2009)   Chol: 189 (03/25/2009)   HDL: 49 (03/25/2009)   LDL: 105 (03/25/2009)   TG: 176 (03/25/2009)  Problem # 5:  IRON DEFICIENCY (ICD-280.9) recent labs are fine.  Hgb: 12.8 (05/08/2009)   Hct: 40.7 (05/08/2009)   Platelets: 254 (05/08/2009) RBC: 4.70 (05/08/2009)   RDW: 15.2 (05/08/2009)   WBC: 11.7 (05/08/2009) MCV: 86.6 (05/08/2009)   MCHC: 31.6 (05/08/2009) Ferritin: 38 (05/08/2009) Iron: 51 (05/08/2009)   TIBC: 380 (05/08/2009)   % Sat: 13 (05/08/2009) B12: 410 (05/08/2009)   Folate: 980 (05/08/2009)   TSH: 1.796 (03/25/2009)  Problem # 6:  DEPRESSION (ICD-311) doing very well lyrica has improved her sleep.   Her updated medication list for this problem includes:    Cymbalta 60 Mg Cpep (Duloxetine hcl) .Marland Kitchen... Take one tablet  daily, also take a 20mg  tablet.    Cymbalta 20 Mg Cpep (Duloxetine hcl) .Marland Kitchen... Take one tablet daily, in addition to a 60mg  tablet.  Complete Medication List: 1)  Lantus 100 Unit/ml Soln (Insulin glargine) .... Inject 70 units once a day. 2)  Advair Diskus 250-50 Mcg/dose Misc (Fluticasone-salmeterol) .... Inhale one puff two times a day 3)  Metformin Hcl 1000 Mg Tabs (Metformin hcl) .... Take 1 tablet by mouth two times a day 4)  Allopurinol 300 Mg Tabs (Allopurinol) .... Take one tablet by mouth once daily 5)  Singulair 10 Mg Tabs (Montelukast sodium) .... Take 1 tablet by mouth once daily 6)  Celebrex 200 Mg Caps (Celecoxib) .... Take 1 capsule by mouth once daily 7)  Cymbalta 60 Mg Cpep (Duloxetine hcl) .... Take one tablet daily, also take a 20mg  tablet. 8)  Pravachol 40 Mg Tabs (Pravastatin sodium) .... Take one tablet daily. 9)  Bd Eclipse Syringe 30g X 1/2" 1 Ml Misc (Syringe/needle (disp)) .... Please check your sugar with every meal and before bed 10)  Ventolin Hfa 108 (90 Base) Mcg/act Aers (Albuterol sulfate) .... Inhale 1-2 puffs once every 4-6 hours as needed for shortness of breath 11)  Novolog 100 Unit/ml Soln (Insulin aspart) .... Inject 40 units into your skin three times a day with meals.  please space these injections out by at least 4-5 hours. 12)  Protonix 40 Mg Pack (Pantoprazole sodium) .... Take one tablet daily. 13)  Triamcinolone Acetonide 0.5 % Crea (Triamcinolone acetonide) .... Applie topically 1-2 time a day 14)  Patanol 0.1 % Soln (Olopatadine hcl) .... One drop in each eye two times a day 15)  Inderal La 60 Mg Xr24h-cap (Propranolol hcl) .Marland Kitchen.. 1 by mouth three times a day 16)  Cymbalta 20 Mg Cpep (Duloxetine hcl) .... Take one tablet daily, in addition to a 60mg  tablet. 17)  Furosemide 40 Mg Tabs (Furosemide) .... Take 1 tablet by mouth two times a day 18)  Ambien 10 Mg Tabs (Zolpidem tartrate) .... Take 1 tab by mouth at bedtime 19)  Onetouch Test Strp (Glucose  blood) .... Check blood sugars three times daily. 20)  Topamax 25 Mg Tabs (Topiramate) .... Three tablets at bedtime 21)  Fexofenadine Hcl 180 Mg Tabs (Fexofenadine hcl) .Marland Kitchen.. 1 tab daily 22)  Astepro 0.15 % Soln (Azelastine hcl) 23)  Meclizine Hcl 25 Mg Tabs (Meclizine hcl) .... Take 1 tablet by mouth once a day 24)  Lyrica 100 Mg Caps (Pregabalin) .... Take one tablet three times a day for foot pain. 25)  Lyrica 50 Mg Caps (Pregabalin) .... Take one tablet in the morning and afternoon.  take two tablets (or 100mg ) at bedtime.  Other Orders: Mammogram (Screening) (Mammo)  Patient Instructions: 1)  Please schedule a follow-up appointment in 3 months. 2)  You are doing great with your blood sugars and blood pressure. 3)  Try to take the lyrica just at bedtime, and see if this helps your leg pain. Prescriptions: LYRICA 50 MG CAPS (PREGABALIN) Take one tablet in the morning and afternoon.  Take two tablets (or 100mg ) at bedtime.  #64 x 1   Entered and Authorized by:   Elby Showers MD   Signed by:   Elby Showers MD on 11/08/2009   Method used:   Telephoned to ...       [facility]  [address]  [phone]* (retail)       [address] [place], [place]  [address]       Ph: [phone] or [phone]       Fax: [phone]   RxID:   [id number]    Prevention & Chronic Care Immunizations   Influenza vaccine: Fluvax MCR  (08/06/2009)    Tetanus booster: Not documented    Pneumococcal vaccine: Not documented  Colorectal Screening   Hemoccult: Not documented    Colonoscopy: Location:  [facility].    (10/20/2008)   Colonoscopy action/deferral: Repeat colonoscopy in 5 years.   (02/12/2003)   Colonoscopy due: 10/20/2013  Other Screening   Pap smear: Not documented   Pap smear due: 05/08/2010    Mammogram: BI-RADS CATEGORY 2:  Benign finding(s).^MM DIGITAL DIAG LTD R  (08/13/2008)   Mammogram action/deferral: Ordered  (11/06/2009)   Mammogram due:  08/05/2010   Smoking status: quit  (11/06/2009)  Diabetes Mellitus   HgbA1C: 6.7  (11/06/2009)   HgbA1C action/deferral: Ordered  (08/03/2009)    Eye exam: Mild non-proliferative diabetic retinopathy.   Visual acuity OD (best corrected):     20/25 Visual acuity OS (best corrected):     20/25 Intraocular pressure OD:     22 Intraocular pressure OS:     18   (08/14/2007)   Diabetic eye exam action/deferral: Ophthalmology referral  (11/06/2009)   Eye exam due: 08/2008    Foot exam: yes  (03/25/2009)   High risk foot: Not documented   Foot care education: Done  (03/25/2009)    Urine microalbumin/creatinine ratio: 24.4  (05/08/2009)   Urine microalbumin action/deferral: Ordered    Diabetes flowsheet reviewed?: Yes   Progress toward A1C goal: Deteriorated  Lipids   Total Cholesterol: 189  (03/25/2009)   LDL: 105  (03/25/2009)   LDL Direct: Not documented   HDL: 49  (03/25/2009)   Triglycerides: 176  (03/25/2009)    SGOT (AST): 15  (03/25/2009)   SGPT (ALT):  18  (03/25/2009)   Alkaline phosphatase: 92  (03/25/2009)   Total bilirubin: 0.3  (03/25/2009)    Lipid flowsheet reviewed?: Yes   Progress toward LDL goal: Unchanged  Hypertension   Last Blood Pressure: 108 / 68  (11/06/2009)   Serum creatinine: 1.10  (03/25/2009)   Serum potassium 4.3  (03/25/2009)    Hypertension flowsheet reviewed?: Yes   Progress toward BP goal: Improved  Self-Management Support :   Personal Goals (by the next clinic visit) :     Personal A1C goal: 7  (07/13/2009)     Personal blood pressure goal: 130/80  (07/13/2009)     Personal LDL goal: 100  (07/13/2009)    Patient will work on the following items until the next clinic visit to reach self-care goals:     Medications and monitoring: take my medicines every day, check my blood sugar, bring all of my medications to every visit, weigh myself weekly, examine my feet every day  (11/06/2009)     Eating: drink diet soda or water instead of  juice or soda, eat more vegetables, use fresh or frozen vegetables, eat baked foods instead of fried foods, eat fruit for snacks and desserts, limit or avoid alcohol  (11/06/2009)     Activity: take a 30 minute walk every day  (11/06/2009)    Diabetes self-management support: Copy of home glucose meter record  (10/06/2009)   Last diabetes self-management training by diabetes educator: 01/03/2008   Last medical nutrition therapy: 05/28/2008    Hypertension self-management support: Written self-care plan  (07/13/2009)    Lipid self-management support: Written self-care plan  (07/13/2009)    Nursing Instructions: Refer for screening diabetic eye exam (see order) Schedule screening mammogram (see order)   Laboratory Results   Blood Tests   Date/Time Received: November 06, 2009 2:56 PM Date/Time Reported: Alric Quan  November 06, 2009 2:56 PM  HGBA1C: 6.7%   (Normal Range: Non-Diabetic - 3-6%   Control Diabetic - 6-8%) CBG Random:: 102mg /dL

## 2010-11-16 NOTE — Progress Notes (Signed)
Summary: med refill/gp  Phone Note Refill Request Message from:  Fax from Pharmacy on September 15, 2010 10:03 AM  Refills Requested: Medication #1:  LYRICA 100 MG CAPS Take one tablet two times a day..   Last Refilled: 08/24/2010 Last appt. Nov 22.   Method Requested: Telephone to Pharmacy Initial call taken by: Chinita Pester RN,  September 15, 2010 10:03 AM  Follow-up for Phone Call        I sent a flag to Ms Lissa Hoard to schedule an appt.  Follow-up by: Blanch Media MD,  September 15, 2010 11:48 AM  Additional Follow-up for Phone Call Additional follow up Details #1::        [facility] Additional Follow-up by: Chinita Pester RN,  September 16, 2010 10:55 AM    Prescriptions: LYRICA 100 MG CAPS (PREGABALIN) Take one tablet two times a day.  #60 x 2   Entered and Authorized by:   Blanch Media MD   Signed by:   Blanch Media MD on 09/15/2010   Method used:   Telephoned to ...       [id number]       [address]       [place], [place]  [address]       Ph: [phone] or [phone]       Fax: [phone]   RxID:   [id number]

## 2010-11-16 NOTE — Progress Notes (Signed)
  Phone Note Other Incoming   Caller: [name] Summary of Call: Patient is coming in on Wed at 11:00 for counseling session.

## 2010-11-16 NOTE — Progress Notes (Signed)
Summary: med reill/gp  Phone Note Refill Request Message from:  Fax from Pharmacy on January 14, 2010 9:56 AM  Refills Requested: Medication #1:  ALLOPURINOL 300 MG TABS Take one tablet by mouth once daily   Last Refilled: 09/13/2009  Method Requested: Electronic Initial call taken by: Chinita Pester RN,  January 14, 2010 9:56 AM    Prescriptions: ALLOPURINOL 300 MG TABS (ALLOPURINOL) Take one tablet by mouth once daily  #90 x 3   Entered and Authorized by:   Elby Showers MD   Signed by:   Elby Showers MD on 01/18/2010   Method used:   Electronically to        [id number] ([facility]       [address]       [place], [place]  [address]       Ph: [phone] or [phone]       Fax: [phone]   RxID:   [id number]

## 2010-11-16 NOTE — Progress Notes (Signed)
Summary: refill/ hla  Phone Note Refill Request Message from:  Fax from Pharmacy on April 27, 2010 12:45 PM  Refills Requested: Medication #1:  ONETOUCH TEST  STRP Check blood sugars three times daily.   Last Refilled: 5/20 Initial call taken by: Marin Roberts RN,  April 27, 2010 12:46 PM  Follow-up for Phone Call        Refill approved-nurse to complete Follow-up by: Julaine Fusi  DO,  April 27, 2010 2:53 PM    Prescriptions: ONETOUCH TEST  STRP (GLUCOSE BLOOD) Check blood sugars three times daily.  #100 x 6   Entered and Authorized by:   Julaine Fusi  DO   Signed by:   Julaine Fusi  DO on 04/27/2010   Method used:   Electronically to        [id number]       [address]       [place], [place]  [address]       Ph: [phone] or [phone]       Fax: [phone]   RxID:   [id number]

## 2010-11-16 NOTE — Progress Notes (Signed)
Summary: med refill/gp  Phone Note Refill Request Message from:  Fax from Pharmacy on Feb 15, 2010 9:58 AM  Refills Requested: Medication #1:  MECLIZINE HCL 25 MG TABS Take 1 tablet by mouth once a day   Last Refilled: 12/21/2009  Method Requested: Electronic Initial call taken by: Chinita Pester RN,  Feb 15, 2010 9:58 AM    Prescriptions: MECLIZINE HCL 25 MG TABS (MECLIZINE HCL) Take 1 tablet by mouth once a day  #30 x 2   Entered and Authorized by:   Elby Showers MD   Signed by:   Elby Showers MD on 02/15/2010   Method used:   Electronically to        [id number]       [address]       [place], [place]  [address]       Ph: [phone] or [phone]       Fax: [phone]   RxID:   [id number]

## 2010-11-16 NOTE — Progress Notes (Signed)
Summary: refill/gg  Phone Note Refill Request  on August 19, 2010 5:47 PM  Refills Requested: Medication #1:  FUROSEMIDE 40 MG TABS Take 1 tablet by mouth two times a day   Last Refilled: 07/14/2010  Medication #2:  LYRICA 100 MG CAPS Take one tablet two times a day..   Last Refilled: 07/14/2010  Medication #3:  AMBIEN 10 MG TABS Take 1 tab by mouth at bedtime   Last Refilled: 07/14/2010  Method Requested: Electronic Initial call taken by: Merrie Roof RN,  August 19, 2010 5:47 PM  Follow-up for Phone Call        Needs office visit for labwork and any additional refills after these. Follow-up by: Julaine Fusi  DO,  August 20, 2010 11:22 AM  Additional Follow-up for Phone Call Additional follow up Details #1::        Rx faxed to pharmacy Pt has scheduled appointment in Nov. Additional Follow-up by: Merrie Roof RN,  August 24, 2010 2:42 PM    Prescriptions: LYRICA 100 MG CAPS (PREGABALIN) Take one tablet two times a day.  #60 x 0   Entered and Authorized by:   Julaine Fusi  DO   Signed by:   Julaine Fusi  DO on 08/20/2010   Method used:   Telephoned to ...       [facility]       [address]       [place], [place]  [address]       Ph: [phone] or [phone]       Fax: [phone]   RxID:   [id number] AMBIEN 10 MG TABS (ZOLPIDEM TARTRATE) Take 1 tab by mouth at bedtime  #30 x 0   Entered and Authorized by:   Julaine Fusi  DO   Signed by:   Julaine Fusi  DO on 08/20/2010   Method used:   Telephoned to ...       [facility]       [address]       [place], [place]  [address]       Ph: [phone] or [phone]       Fax: [phone]   RxID:   [id number] FUROSEMIDE 40 MG TABS (FUROSEMIDE) Take 1 tablet by mouth two times a day  #60 x 0   Entered and Authorized by:   Julaine Fusi  DO   Signed by:   Julaine Fusi  DO on 08/20/2010   Method used:   Telephoned to ...       [facility]  [address]       [place], [place]  [address]       Ph: [phone] or [phone]       Fax: [phone]   RxID:   [id number]

## 2010-11-16 NOTE — Progress Notes (Signed)
Summary: Refill/gh  Phone Note Refill Request Message from:  Fax from Pharmacy on June 16, 2010 8:50 AM  Refills Requested: Medication #1:  CYMBALTA 60 MG CPEP Take one tablet daily   Dosage confirmed as above?Dosage Confirmed   Supply Requested: 3 months  Medication #2:  VENTOLIN HFA 108 (90 BASE) MCG/ACT  AERS inhale 1-2 puffs once every 4-6 hours as needed for shortness of breath   Dosage confirmed as above?Dosage Confirmed   Supply Requested: 3 months Last labs and office vists were 02/02/2010 and 02/03/2010.   Method Requested: Electronic Initial call taken by: Angelina Ok RN,  June 16, 2010 8:50 AM    Prescriptions: VENTOLIN HFA 108 (90 BASE) MCG/ACT  AERS (ALBUTEROL SULFATE) inhale 1-2 puffs once every 4-6 hours as needed for shortness of breath  #1 inhaler x 2   Entered and Authorized by:   Doneen Poisson MD   Signed by:   Doneen Poisson MD on 06/16/2010   Method used:   Electronically to        [facility]       [address]       [place], [place]  [address]       Ph: [phone] or [phone]       Fax: [phone]   RxID:   [id number] CYMBALTA 60 MG CPEP (DULOXETINE HCL) Take one tablet daily, also take a 20mg  tablet.  #30 x 2   Entered and Authorized by:   Doneen Poisson MD   Signed by:   Doneen Poisson MD on 06/16/2010   Method used:   Electronically to        [facility] ([facility]       [address]       [place], [place]  [address]       Ph: [phone] or [phone]       Fax: [phone]   RxID:   [id number]

## 2010-11-16 NOTE — Letter (Signed)
Summary: [facility]   [facility]   Imported By: Margie Billet 02/01/2010 14:27:53  _____________________________________________________________________  External Attachment:    Type:   Image     Comment:   External Document

## 2010-11-16 NOTE — Miscellaneous (Signed)
Summary: [facility]  [facility]   Imported By: Margie Billet 01/27/2010 13:58:07  _____________________________________________________________________  External Attachment:    Type:   Image     Comment:   External Document

## 2010-11-16 NOTE — Progress Notes (Signed)
Summary: phone/gg  Phone Note Call from Patient   Caller: Patient Summary of Call: Pt called to see if med was called in, It has been called in pt informed Initial call taken by: Merrie Roof RN,  April 01, 2010 4:11 PM

## 2010-11-18 NOTE — Progress Notes (Signed)
Summary: refill/ hla  Phone Note Refill Request Message from:  Patient on October 06, 2010 2:06 PM  Refills Requested: Medication #1:  NOVOLOG 100 UNIT/ML SOLN Inject 40 units into your skin three times a day with meals.  Please space these injections out by at least 4-5 hours.   Dosage confirmed as above?Dosage Confirmed PT WOULD LIKE THE NOVOLOG PENS last visit 08/2010  Initial call taken by: Marin Roberts RN,  October 06, 2010 2:07 PM  Follow-up for Phone Call        I would prefer that patient be instructed in the use of the Novolog pens before switching to them.  Can we arrange for her to see Jamison Neighbor in the near future. Follow-up by: Margarito Liner MD,  October 07, 2010 12:17 PM  Additional Follow-up for Phone Call Additional follow up Details #1::        pt had stated she would like pens because she would be traveling during the holidays and thought pens would be easier to deal with Additional Follow-up by: Marin Roberts RN,  October 07, 2010 3:30 PM    Additional Follow-up for Phone Call Additional follow up Details #2::    I am happy to see patient on Tuesday Dec 27th and January 3rd.  Follow-up by: Jamison Neighbor RD,CDE,  October 07, 2010 6:08 PM  Additional Follow-up for Phone Call Additional follow up Details #3:: Details for Additional Follow-up Action Taken: Refilled electronically pending visit with diabetes educator. Additional Follow-up by: Margarito Liner MD,  October 07, 2010 6:14 PM  Prescriptions: NOVOLOG 100 UNIT/ML SOLN (INSULIN ASPART) Inject 40 units into your skin three times a day with meals.  Please space these injections out by at least 4-5 hours.  #5 vials x 0   Entered and Authorized by:   Margarito Liner MD   Signed by:   Margarito Liner MD on 10/07/2010   Method used:   Electronically to        [id number] (retail[facility]       [address]       [place], [place]  [address]       Ph: [phone] or [phone]       Fax: [phone]   RxID:    [id number]   Appended Document: refill/ hla appointment scheduled for 10/19/2010- same day patient seeing doctor

## 2010-11-18 NOTE — Progress Notes (Signed)
Summary: refill/gg  Phone Note Refill Request  on September 27, 2010 4:50 PM  Refills Requested: Medication #1:  AMBIEN 10 MG TABS Take 1 tab by mouth at bedtime   Last Refilled: 08/24/2010  Method Requested: Fax to Local Pharmacy Initial call taken by: Merrie Roof RN,  September 27, 2010 4:51 PM  Follow-up for Phone Call        Refill approved-nurse to complete. Follow-up by: Margarito Liner MD,  September 27, 2010 5:05 PM  Additional Follow-up for Phone Call Additional follow up Details #1::        Rx called to pharmacy Additional Follow-up by: Merrie Roof RN,  September 28, 2010 5:40 PM    Prescriptions: AMBIEN 10 MG TABS (ZOLPIDEM TARTRATE) Take 1 tab by mouth at bedtime  #30 x 1   Entered and Authorized by:   Margarito Liner MD   Signed by:   Margarito Liner MD on 09/27/2010   Method used:   Telephoned to ...       [id number] (retail[facility]       [address]       [place], [place]  [address]       Ph: [phone] or [phone]       Fax: [phone]   RxID:   [id number]

## 2010-11-18 NOTE — Progress Notes (Signed)
Summary: Refill/gh  Phone Note Refill Request Message from:  Fax from Pharmacy on September 30, 2010 9:15 AM  Refills Requested: Medication #1:  LANTUS 100 UNIT/ML  SOLN Inject 70 units once a day.   Last Refilled: 06/16/2010 Last office visit was 09/07/2010.  Last labs were 02/03/2010.   Method Requested: Electronic Initial call taken by: Angelina Ok RN,  September 30, 2010 9:15 AM  Follow-up for Phone Call        Rx faxed to pharmacy Follow-up by: Mariea Stable MD,  September 30, 2010 9:22 AM    Prescriptions: LANTUS 100 UNIT/ML  SOLN (INSULIN GLARGINE) Inject 70 units once a day.  #3 vials x 2   Entered and Authorized by:   Mariea Stable MD   Signed by:   Mariea Stable MD on 09/30/2010   Method used:   Electronically to        [id number] ([facility]       [address]       [place], [place]  [address]       Ph: [phone] or [phone]       Fax: [phone]   RxID:   [id number]

## 2010-11-18 NOTE — Progress Notes (Signed)
Summary: refill/gg  Phone Note Refill Request  on September 24, 2010 5:14 PM  Refills Requested: Medication #1:  BD ECLIPSE SYRINGE 30G X 1/2" 1 ML  MISC Please check your sugar with every meal and before bed   Dosage confirmed as above?Dosage Confirmed   Supply Requested: 1 month   Last Refilled: 08/19/2010  Method Requested: Electronic Initial call taken by: Merrie Roof RN,  September 24, 2010 5:14 PM    Prescriptions: BD ECLIPSE SYRINGE 30G X 1/2" 1 ML  MISC (SYRINGE/NEEDLE (DISP)) Please check your sugar with every meal and before bed  #1 box x prn   Entered and Authorized by:   Doneen Poisson MD   Signed by:   Doneen Poisson MD on 09/24/2010   Method used:   Electronically to        [id number] ([facility]       [address]       [place], [place]  [address]       Ph: [phone] or [phone]       Fax: [phone]   RxID:   [id number]

## 2010-11-18 NOTE — Progress Notes (Signed)
Summary: refill/ hla  Phone Note Refill Request Message from:  Fax from Pharmacy on October 29, 2010 11:18 AM  Refills Requested: Medication #1:  NASONEX 50 MCG/ACT SUSP apply 2 sprays in each nostril at bedtime   Dosage confirmed as above?Dosage Confirmed Initial call taken by: Marin Roberts RN,  October 29, 2010 11:18 AM    Prescriptions: NASONEX 50 MCG/ACT SUSP (MOMETASONE FUROATE) apply 2 sprays in each nostril at bedtime  #1 x 2   Entered and Authorized by:   Whitney Post MD   Signed by:   Whitney Post MD on 11/02/2010   Method used:   Electronically to        [id number] ([facility]       [address]       [place], [place]  [address]       Ph: [phone] or [phone]       Fax: [phone]   RxID:   [id number]

## 2010-11-23 ENCOUNTER — Emergency Department (HOSPITAL_COMMUNITY)
Admission: EM | Admit: 2010-11-23 | Discharge: 2010-11-23 | Disposition: A | Discharge status: Home / Self Care | Payer: Medicare Other | Attending: Emergency Medicine | Admitting: Emergency Medicine

## 2010-11-23 DIAGNOSIS — J449 Chronic obstructive pulmonary disease, unspecified: Secondary | ICD-10-CM | POA: Insufficient documentation

## 2010-11-23 DIAGNOSIS — Z79899 Other long term (current) drug therapy: Secondary | ICD-10-CM | POA: Insufficient documentation

## 2010-11-23 DIAGNOSIS — E11319 Type 2 diabetes mellitus with unspecified diabetic retinopathy without macular edema: Secondary | ICD-10-CM | POA: Insufficient documentation

## 2010-11-23 DIAGNOSIS — M129 Arthropathy, unspecified: Secondary | ICD-10-CM | POA: Insufficient documentation

## 2010-11-23 DIAGNOSIS — E1142 Type 2 diabetes mellitus with diabetic polyneuropathy: Secondary | ICD-10-CM | POA: Insufficient documentation

## 2010-11-23 DIAGNOSIS — M25469 Effusion, unspecified knee: Secondary | ICD-10-CM | POA: Insufficient documentation

## 2010-11-23 DIAGNOSIS — J4489 Other specified chronic obstructive pulmonary disease: Secondary | ICD-10-CM | POA: Insufficient documentation

## 2010-11-23 DIAGNOSIS — I509 Heart failure, unspecified: Secondary | ICD-10-CM | POA: Insufficient documentation

## 2010-11-23 DIAGNOSIS — E669 Obesity, unspecified: Secondary | ICD-10-CM | POA: Insufficient documentation

## 2010-11-23 DIAGNOSIS — E785 Hyperlipidemia, unspecified: Secondary | ICD-10-CM | POA: Insufficient documentation

## 2010-11-23 DIAGNOSIS — I10 Essential (primary) hypertension: Secondary | ICD-10-CM | POA: Insufficient documentation

## 2010-11-23 DIAGNOSIS — M25569 Pain in unspecified knee: Secondary | ICD-10-CM | POA: Insufficient documentation

## 2010-11-23 DIAGNOSIS — Z794 Long term (current) use of insulin: Secondary | ICD-10-CM | POA: Insufficient documentation

## 2010-11-23 DIAGNOSIS — E1149 Type 2 diabetes mellitus with other diabetic neurological complication: Secondary | ICD-10-CM | POA: Insufficient documentation

## 2010-11-24 ENCOUNTER — Other Ambulatory Visit: Payer: Self-pay | Admitting: Internal Medicine

## 2010-11-24 ENCOUNTER — Ambulatory Visit (HOSPITAL_COMMUNITY)
Admission: RE | Admit: 2010-11-24 | Discharge: 2010-11-24 | Disposition: A | Discharge status: Home / Self Care | Payer: Medicare Other | Source: Ambulatory Visit | Attending: Emergency Medicine | Admitting: Emergency Medicine

## 2010-11-24 DIAGNOSIS — I10 Essential (primary) hypertension: Secondary | ICD-10-CM | POA: Insufficient documentation

## 2010-11-24 DIAGNOSIS — J4489 Other specified chronic obstructive pulmonary disease: Secondary | ICD-10-CM | POA: Insufficient documentation

## 2010-11-24 DIAGNOSIS — E119 Type 2 diabetes mellitus without complications: Secondary | ICD-10-CM | POA: Insufficient documentation

## 2010-11-24 DIAGNOSIS — J449 Chronic obstructive pulmonary disease, unspecified: Secondary | ICD-10-CM | POA: Insufficient documentation

## 2010-11-24 DIAGNOSIS — M79609 Pain in unspecified limb: Secondary | ICD-10-CM

## 2010-11-24 MED ORDER — GLUCOSE BLOOD VI STRP: ORAL_STRIP | Status: DC

## 2010-11-26 ENCOUNTER — Encounter: Payer: Self-pay | Admitting: Internal Medicine

## 2010-11-26 ENCOUNTER — Other Ambulatory Visit: Payer: Self-pay | Admitting: Internal Medicine

## 2010-11-26 ENCOUNTER — Ambulatory Visit (INDEPENDENT_AMBULATORY_CARE_PROVIDER_SITE_OTHER): Payer: Medicare Other | Admitting: Internal Medicine

## 2010-11-26 ENCOUNTER — Ambulatory Visit (INDEPENDENT_AMBULATORY_CARE_PROVIDER_SITE_OTHER): Payer: Medicare Other | Admitting: Dietician

## 2010-11-26 VITALS — BP 118/67 | HR 69 | Temp 97.9°F | Ht 64.0 in | Wt 257.8 lb

## 2010-11-26 DIAGNOSIS — E785 Hyperlipidemia, unspecified: Secondary | ICD-10-CM

## 2010-11-26 DIAGNOSIS — E119 Type 2 diabetes mellitus without complications: Secondary | ICD-10-CM

## 2010-11-26 DIAGNOSIS — G4733 Obstructive sleep apnea (adult) (pediatric): Secondary | ICD-10-CM

## 2010-11-26 DIAGNOSIS — M79669 Pain in unspecified lower leg: Secondary | ICD-10-CM

## 2010-11-26 DIAGNOSIS — I10 Essential (primary) hypertension: Secondary | ICD-10-CM

## 2010-11-26 DIAGNOSIS — F329 Major depressive disorder, single episode, unspecified: Secondary | ICD-10-CM

## 2010-11-26 DIAGNOSIS — F3289 Other specified depressive episodes: Secondary | ICD-10-CM

## 2010-11-26 DIAGNOSIS — M79609 Pain in unspecified limb: Secondary | ICD-10-CM

## 2010-11-26 LAB — GLUCOSE, CAPILLARY: Glucose-Capillary: 154 mg/dL — ABNORMAL HIGH (ref 70–99)

## 2010-11-26 MED ORDER — FLUTICASONE-SALMETEROL 250-50 MCG/DOSE IN AEPB: 1.0000 | INHALATION_SPRAY | Freq: Two times a day (BID) | RESPIRATORY_TRACT | Status: DC

## 2010-11-26 MED ORDER — ZOLPIDEM TARTRATE 10 MG PO TABS: 10.0000 mg | ORAL_TABLET | Freq: Every evening | ORAL | Status: DC | PRN

## 2010-11-26 MED ORDER — HYDROCODONE-ACETAMINOPHEN 5-325 MG PO TABS: 1.0000 | ORAL_TABLET | Freq: Four times a day (QID) | ORAL | Status: DC | PRN

## 2010-11-26 MED ORDER — MECLIZINE HCL 25 MG PO TABS: 25.0000 mg | ORAL_TABLET | Freq: Every day | ORAL | Status: DC

## 2010-11-26 NOTE — Progress Notes (Signed)
Diabetes Self-Management Training (DSMT)  Follow Up Visit  11/26/2010 [name] Rice, identified by name and date of birth, is a 55 y.o. female with Type 2 Diabetes. Year of diabetes diagnosis: 2000 Other persons present: none  ASSESSMENT Patient concerns are Medication.  Last menstrual period 10/18/1995. There is no height or weight on file to calculate BMI. No results found for this basename: [facility]   Lab Results  Component Value Date   HGBA1C 7.2 09/07/2010   Medication Nutrition Monitor: one touch  Labs reviewed.  DIABETES BUNDLE: A1C in past 6 months? Yes.  Less than 7%? No LDL in past year? Yes.  Less than 100 mg/dL? Yes Microalbumin ratio in past year? No. Patient taking ACE or ARB? Yes. Blood pressure less than 130/80? Yes. Foot exam in last year? Yes. Eye exam in past year? No.  Reminded patient to schedule eye exam. Tobacco use? No. Pneumovax? Yes Flu vaccine? Yes Asprin? No  Family history of diabetes: Yes Support systems: friends Special needs: None Prior DM Education: YesPatients belief/attitude about diabetes: Diabetes can be controlled. Self foot exams daily: Yes Diabetes Complications: Neuropathy Retinopathy     Medications See Medications list.  Has adequate knowledge and Is interested in learning more   Exercise Plan Doing ADLs for sleeps quite a bit, little actiivtya day.   Self-Monitoring Frequency of testing: 2-3 times/day  Hyperglycemia: No Hypoglycemia: No   Meal Planning Some knowledge and No interest in improving   Assessment comments: patient desires to learn about using insulin pens for meal coverage today.     INDIVIDUAL DIABETES EDUCATION PLAN:  Medication _______________________________________________________________________  Intervention TOPICS COVERED TODAY:  Medication  Taught/evaluated insulin injection, site rotation, insulin storage and needle disposal.  PATIENTS GOALS/PLAN (copy and paste in patient  instructions so patient receives a copy): 1.  Learning Objective:       To be able to use proper technique with insulin pens, verbalize understanding of proper storage of insulin  2.  Behavioral Objective:         Medications: To improve blood glucose levels, I will take my medication as prescribed Most of the time 75%  Personalized Follow-Up Plan for Ongoing Self Management Support:  Doctor's Office, friends and CDE visits ______________________________________________________________________   Outcomes Expected outcomes: Demonstrated interest in learning.Expect positive changes in lifestyle.  Self-care Barriers: Unsteady gait/risk for falls, Coping skills  Education material provided: pamphlet on using insulin pens   Patient to contact team via Phone if problems or questions.  Time in: 1600     Time out: 1630  Future DSMT - PRN   [name] Rice

## 2010-11-26 NOTE — Patient Instructions (Signed)
Please schedule follow up appointment in 3 months

## 2010-11-29 ENCOUNTER — Encounter: Payer: Self-pay | Admitting: Internal Medicine

## 2010-11-29 DIAGNOSIS — M79669 Pain in unspecified lower leg: Secondary | ICD-10-CM | POA: Insufficient documentation

## 2010-11-29 DIAGNOSIS — G4733 Obstructive sleep apnea (adult) (pediatric): Secondary | ICD-10-CM | POA: Insufficient documentation

## 2010-11-29 MED ORDER — INSULIN PEN NEEDLE 31G X 5 MM MISC: 1.0000 | Freq: Three times a day (TID) | Status: DC

## 2010-11-29 MED ORDER — INSULIN ASPART 100 UNIT/ML ~~LOC~~ SOLN: 40.0000 [IU] | Freq: Three times a day (TID) | SUBCUTANEOUS | Status: DC

## 2010-11-29 NOTE — Assessment & Plan Note (Signed)
At goal, will continue the same medication regimen.

## 2010-11-29 NOTE — Assessment & Plan Note (Signed)
She is due for another HgA1C but she reports that she has regular appointment next week when she was going to have all the labs tests done. No changes in the medication regimen will be done today. She was advised to bring in her monitor next week.

## 2010-11-29 NOTE — Progress Notes (Signed)
  Subjective:    Patient ID: [name] Rice, female    DOB: Nov 14, 1955, 55 y.o.   MRN: [id number]  HPI  55 yo female with PMH outlined below presents to [facility] with main concern of left calf pain that started about 1 week ago and has been getting better. She has seen physician in ED last week and has had doppler US done to rule out DVT and that was negative. In addition, she was given percocet and that helped with pain. She is doing bette now, the pain is also manageable and she is able to ambulate better. She denies any other recent sicknesses or hospitalizations, no fevers, chills, no abdominal or urinary concerns, no systemic symptoms of night sweats, weight loss, changes in appetite, no fecal or urinary incontinence, no shortness or breath.   Review of Systems  Constitutional: Negative.   HENT: Negative.   Respiratory: Negative.   Cardiovascular: Negative.   Gastrointestinal: Negative.   Genitourinary: Negative.   Musculoskeletal: Negative.  Negative for back pain, joint swelling, arthralgias and gait problem.  Neurological: Negative.   Psychiatric/Behavioral: Negative.        Objective:   Physical Exam  Constitutional: She is oriented to person, place, and time. She appears well-developed and well-nourished. No distress.  HENT:  Head: Normocephalic and atraumatic.  Right Ear: External ear normal.  Left Ear: External ear normal.  Nose: Nose normal.  Mouth/Throat: Oropharynx is clear and moist. No oropharyngeal exudate.  Cardiovascular: Normal rate, normal heart sounds and intact distal pulses.  Exam reveals no gallop and no friction rub.   No murmur heard. Pulmonary/Chest: Effort normal and breath sounds normal. No respiratory distress. She has no wheezes. She has no rales. She exhibits no tenderness.  Abdominal: Soft. Bowel sounds are normal. She exhibits no distension and no mass. There is no tenderness. There is no rebound and no guarding.  Musculoskeletal: Normal range of  motion. She exhibits no edema and no tenderness.  Neurological: She is alert and oriented to person, place, and time. She has normal strength and normal reflexes. She is not disoriented. She displays no atrophy and no tremor. No sensory deficit. She exhibits normal muscle tone. She displays a negative Romberg sign. She displays no seizure activity. Coordination and gait normal.  Skin: She is not diaphoretic.          Assessment & Plan:

## 2010-11-29 NOTE — Assessment & Plan Note (Signed)
She will need FLP and she wants to have it done next week with the rest of the labs.

## 2010-11-29 NOTE — Assessment & Plan Note (Signed)
Well controlled on current medications, continue the same regimen.

## 2010-12-18 ENCOUNTER — Other Ambulatory Visit: Payer: Self-pay | Admitting: Internal Medicine

## 2010-12-21 ENCOUNTER — Other Ambulatory Visit: Payer: Self-pay | Admitting: *Deleted

## 2010-12-22 MED ORDER — TRIAMCINOLONE ACETONIDE 0.5 % EX CREA: TOPICAL_CREAM | Freq: Two times a day (BID) | CUTANEOUS | Status: DC

## 2010-12-30 LAB — POCT I-STAT, CHEM 8
Calcium, Ion: 1.09 mmol/L — ABNORMAL LOW (ref 1.12–1.32)
Glucose, Bld: 183 mg/dL — ABNORMAL HIGH (ref 70–99)
HCT: 44 % (ref 36.0–46.0)
Hemoglobin: 15 g/dL (ref 12.0–15.0)
TCO2: 28 mmol/L (ref 0–100)

## 2011-01-02 LAB — CBC
HCT: 38.5 % (ref 36.0–46.0)
Hemoglobin: 12.3 g/dL (ref 12.0–15.0)
MCHC: 31.9 g/dL (ref 30.0–36.0)
MCV: 83.1 fL (ref 78.0–100.0)
Platelets: 192 K/uL (ref 150–400)
RBC: 4.63 MIL/uL (ref 3.87–5.11)
RDW: 16.2 % — ABNORMAL HIGH (ref 11.5–15.5)
WBC: 9.5 K/uL (ref 4.0–10.5)

## 2011-01-02 LAB — POCT I-STAT, CHEM 8
BUN: 21 mg/dL (ref 6–23)
Calcium, Ion: 1.26 mmol/L (ref 1.12–1.32)
Chloride: 106 meq/L (ref 96–112)
Creatinine, Ser: 0.9 mg/dL (ref 0.4–1.2)
Glucose, Bld: 121 mg/dL — ABNORMAL HIGH (ref 70–99)
HCT: 40 % (ref 36.0–46.0)
Hemoglobin: 13.6 g/dL (ref 12.0–15.0)
Potassium: 3.8 meq/L (ref 3.5–5.1)
Sodium: 140 meq/L (ref 135–145)
TCO2: 25 mmol/L (ref 0–100)

## 2011-01-02 LAB — DIFFERENTIAL
Basophils Absolute: 0 K/uL (ref 0.0–0.1)
Basophils Relative: 1 % (ref 0–1)
Eosinophils Absolute: 0.3 K/uL (ref 0.0–0.7)
Eosinophils Relative: 3 % (ref 0–5)
Lymphocytes Relative: 23 % (ref 12–46)
Lymphs Abs: 2.2 K/uL (ref 0.7–4.0)
Monocytes Absolute: 0.4 K/uL (ref 0.1–1.0)
Monocytes Relative: 5 % (ref 3–12)
Neutro Abs: 6.5 K/uL (ref 1.7–7.7)
Neutrophils Relative %: 69 % (ref 43–77)

## 2011-01-03 LAB — GLUCOSE, CAPILLARY: Glucose-Capillary: 102 mg/dL — ABNORMAL HIGH (ref 70–99)

## 2011-01-04 LAB — GLUCOSE, CAPILLARY: Glucose-Capillary: 159 mg/dL — ABNORMAL HIGH (ref 70–99)

## 2011-01-07 ENCOUNTER — Ambulatory Visit (INDEPENDENT_AMBULATORY_CARE_PROVIDER_SITE_OTHER): Payer: Medicare Other | Admitting: Internal Medicine

## 2011-01-07 DIAGNOSIS — IMO0002 Reserved for concepts with insufficient information to code with codable children: Secondary | ICD-10-CM

## 2011-01-07 DIAGNOSIS — F3289 Other specified depressive episodes: Secondary | ICD-10-CM

## 2011-01-07 DIAGNOSIS — E119 Type 2 diabetes mellitus without complications: Secondary | ICD-10-CM

## 2011-01-07 DIAGNOSIS — F329 Major depressive disorder, single episode, unspecified: Secondary | ICD-10-CM

## 2011-01-07 MED ORDER — ALLOPURINOL 300 MG PO TABS: 300.0000 mg | ORAL_TABLET | Freq: Every day | ORAL | Status: DC

## 2011-01-07 MED ORDER — METFORMIN HCL 1000 MG PO TABS: 1000.0000 mg | ORAL_TABLET | Freq: Two times a day (BID) | ORAL | Status: DC

## 2011-01-07 MED ORDER — FUROSEMIDE 40 MG PO TABS: 40.0000 mg | ORAL_TABLET | Freq: Every day | ORAL | Status: DC

## 2011-01-07 MED ORDER — DULOXETINE HCL 60 MG PO CPEP: 60.0000 mg | ORAL_CAPSULE | Freq: Every day | ORAL | Status: DC

## 2011-01-07 MED ORDER — PREGABALIN 100 MG PO CAPS: 100.0000 mg | ORAL_CAPSULE | Freq: Two times a day (BID) | ORAL | Status: DC

## 2011-01-07 MED ORDER — INSULIN ASPART 100 UNIT/ML ~~LOC~~ SOLN: 40.0000 [IU] | Freq: Three times a day (TID) | SUBCUTANEOUS | Status: DC

## 2011-01-07 MED ORDER — HYDROCODONE-ACETAMINOPHEN 5-325 MG PO TABS: 1.0000 | ORAL_TABLET | Freq: Four times a day (QID) | ORAL | Status: DC | PRN

## 2011-01-07 NOTE — Patient Instructions (Signed)
Please follow-up with Dr. Odis Luster on Friday April 13th at 2:45pm.   Stop taking your propranolol and only take the methocarbamol, Ambien, and meclizine as needed.  Try to keep a regular schedule, getting up and going to bed at the same time each day. Exercise and get outside into the sunlight at least once daily. Try to get involved in your community and activities, such as your church.

## 2011-01-07 NOTE — Progress Notes (Signed)
Subjective:    Patient ID: [name] Rice, female    DOB: July 25, 1956, 55 y.o.   MRN: [id number]  HPI 55yo W with PMH of DM2 with neuropathy, depression, cervical dystonia presents for follow-up of: 1. Chronic pain. She reports ongoing pain in bilateral feet secondary to neuropathy. She feels that this pain has progressed slightly in her L foot, now involving the sole of the foot as well as the toes. She states that this pain often makes it difficult for her to sleep at night; she takes Vicodin at bedtime to help with this. She also reports some ongoing minor pain in R ankle and L knee after fall last year.  2. Depression. She reports significant depressive symptoms including depressed mood and sleeping all day, not wanting to get out of bed. She says that at most she is often up only about 8 hours per day and, as a result, often misses her morning doses of medication. She has minimal social interaction and is out of contact with most members of her family after a dispute over a will. She reports a nocturnal schedule, where she gets up at approximately 7pm and goes back to bed at 5am. She is very distressed about her sleep difficulty. She says that she can lay in bed for hours unable to go to sleep, so she takes several medications including Vicodin, Ambien, methocarbamol; she feels that these meds knock her out and keep her from getting up for 12-16hours. She is tearful but denies any suicidal ideation. She stated that she attempted suicide once in the 1980s and is certain that she will never try it again. She had previously been talking to Dorothe Pea (social work) for counseling but stopped last October because the patient felt that she had been doing so much better. She can't identify a definitive reason for why the past few months have been so much harder.   Current Outpatient Prescriptions on File Prior to Visit  Medication Sig Dispense Refill  . metFORMIN (GLUCOPHAGE) 1000 MG tablet Take 1 tablet  (1,000 mg total) by mouth 2 (two) times daily with a meal.  180 tablet  3  . albuterol (VENTOLIN HFA) 108 (90 BASE) MCG/ACT inhaler Inhale 2 puffs into the lungs every 6 (six) hours as needed.        Marland Kitchen allopurinol (ZYLOPRIM) 300 MG tablet Take 1 tablet (300 mg total) by mouth daily.  90 tablet  3  . DULoxetine (CYMBALTA) 20 MG capsule Take 20 mg by mouth daily.        . Fluticasone-Salmeterol (ADVAIR DISKUS) 250-50 MCG/DOSE AEPB Inhale 1 puff into the lungs 2 (two) times daily.  60 each  5  . furosemide (LASIX) 40 MG tablet Take 1 tablet (40 mg total) by mouth daily.  90 tablet  3  . glucose blood (BL TEST STRIP PACK) test strip test sugar three times a day  100 each  12  . HYDROcodone-acetaminophen (NORCO) 5-325 MG per tablet Take 1 tablet by mouth every 6 (six) hours as needed.  60 tablet  0  . insulin aspart (NOVOLOG) 100 UNIT/ML injection Inject 40 Units into the skin 3 (three) times daily before meals.  10 mL  3  . insulin glargine (LANTUS) 100 UNIT/ML injection Inject 70 Units into the skin at bedtime.        . Insulin Pen Needle (B-D UF III MINI PEN NEEDLES) 31G X 5 MM MISC 1 each by Does not apply route 3 (three) times daily.  100 each  11  . meclizine (ANTIVERT) 25 MG tablet Take 1 tablet (25 mg total) by mouth daily.  30 tablet  1  . mometasone (NASONEX) 50 MCG/ACT nasal spray 2 sprays by Nasal route daily.        . montelukast (SINGULAIR) 10 MG tablet Take 10 mg by mouth at bedtime.        Marland Kitchen olopatadine (PATANOL) 0.1 % ophthalmic solution 1 drop 2 (two) times daily.        . pantoprazole (PROTONIX) 40 MG tablet Take 40 mg by mouth daily.        . pravastatin (PRAVACHOL) 40 MG tablet Take 1 tablet (40 mg total) by mouth daily.  30 tablet  2  . pregabalin (LYRICA) 100 MG capsule Take 1 capsule (100 mg total) by mouth 2 (two) times daily.  60 capsule  5  . propranolol (INDERAL LA) 60 MG 24 hr capsule Take 60 mg by mouth 2 (two) times daily.        . Syringe/Needle, Disp, (B-D ECLIPSE  SYRINGE) 30G X 1/2" 1 ML MISC 1 Syringe by Does not apply route 3 (three) times daily.        Marland Kitchen triamcinolone (KENALOG) 0.5 % cream Apply topically 2 (two) times daily.  30 g  2  . zolpidem (AMBIEN) 10 MG tablet Take 1 tablet (10 mg total) by mouth at bedtime as needed.  30 tablet  1   Past Medical History  Diagnosis Date  . Anxiety   . Arthritis   . Asthma   . COPD (chronic obstructive pulmonary disease)   . Depression   . Diabetes mellitus   . GERD (gastroesophageal reflux disease)   . Hypertension   . HLD (hyperlipidemia)   . OSA on CPAP 2008  . Dizziness   . History of colonic polyps   . Lumbar pain 2011    per x-ray - Multilevel spondylosis     Review of Systems  Constitutional: Positive for activity change and fatigue. Negative for fever, chills and appetite change.  HENT: Negative for congestion, sore throat and rhinorrhea.   Eyes: Negative for visual disturbance.  Respiratory: Negative for cough and shortness of breath.   Cardiovascular: Negative for chest pain and palpitations.  Gastrointestinal: Negative for nausea, vomiting, abdominal pain, diarrhea, constipation and blood in stool.  Genitourinary: Negative for dysuria.  Musculoskeletal: Positive for joint swelling and arthralgias.  Skin: Negative for rash.  Neurological: Negative for dizziness, weakness and light-headedness.  Psychiatric/Behavioral: Positive for sleep disturbance and dysphoric mood. Negative for suicidal ideas, hallucinations and self-injury.       Objective:   Physical Exam  Nursing note and vitals reviewed. Constitutional: She is oriented to person, place, and time. No distress.       obese  HENT:  Head: Normocephalic and atraumatic.  Mouth/Throat: Oropharynx is clear and moist. No oropharyngeal exudate.  Eyes: Conjunctivae and EOM are normal. Pupils are equal, round, and reactive to light. No scleral icterus.  Neck: Normal range of motion. Neck supple. No JVD present. No tracheal  deviation present.  Cardiovascular: Normal rate, regular rhythm, normal heart sounds and intact distal pulses.  Exam reveals no gallop and no friction rub.   No murmur heard. Pulmonary/Chest: Effort normal and breath sounds normal. No respiratory distress. She has no wheezes. She has no rales. She exhibits no tenderness.  Abdominal: Soft. Bowel sounds are normal. She exhibits no distension and no mass. There is no tenderness. There is no rebound and  no guarding.  Musculoskeletal: Normal range of motion. She exhibits no edema.  Neurological: She is alert and oriented to person, place, and time. No cranial nerve deficit. Coordination normal.  Skin: Skin is warm and dry. No rash noted. She is not diaphoretic.  Psychiatric:       Patient tearful, thought process is logical, good eye contact.       Assessment & Plan:

## 2011-01-10 ENCOUNTER — Encounter: Payer: Self-pay | Admitting: Internal Medicine

## 2011-01-10 LAB — GLUCOSE, CAPILLARY: Glucose-Capillary: 243 mg/dL — ABNORMAL HIGH (ref 70–99)

## 2011-01-10 NOTE — Assessment & Plan Note (Signed)
HbA1C increased to 7.6, likely secondary to her increased difficulty in taking morning meds because of disturbed sleep pattern. Glucose meter report reviewed. No low CBGs, most CBGs within goal but several readings elevated in 200s. Patient attributes worsened control to eating more sweets recently. Continue current management. Control of depressive symptoms and sleep disturbance is current medical priority for patient. I expect that glycemic control will improve once she is able to maintain a normal schedule with routine for taking meds.

## 2011-01-10 NOTE — Assessment & Plan Note (Signed)
Patient complains of several types of pain, including neuropathy in feet, radicular pain on L, and minor aches in L knee and R ankle related to fall last year. None of these pains currently seem acute or progressive; they are stable. Will continue Lyrica as well as prn Vicodin, which she uses to treat pain prior to sleep. Believe that complaints of pain are also driven partially be depression. Plan to hopefully simplify pain regimen at follow-up.

## 2011-01-10 NOTE — Assessment & Plan Note (Signed)
Patient encouraged to continue Cymbalta. Believe that polypharmacy is likely contributing to sleep disturbance and even depressive symptoms. Will D/C propranolol. Also advised patient to take Ambien, methocarbamol, and meclizine only as needed (she currently takes each of these daily). My hope is that we may be able to eliminate some of these meds as well in the coming weeks/months. Will follow her closely in the clinic, with next follow-up appointment in 3 weeks. Although patient denies suicidal ideation, she promises that she will seek medical attention immediately if she develops such thoughts. Patient is encouraged to work on maintaining a more normal daytime schedule, including getting up and going to bed at the same time each day. She is encouraged to exercise using the pool and club house exercise equipment in the complex where she lives. She is also encouraged to get outside into the sunlight at least once each day. She is also encouraged to reach out to her family and consider getting re-involved in her community (perhaps rejoin a church).

## 2011-01-11 ENCOUNTER — Telehealth: Payer: Self-pay | Admitting: Licensed Clinical Social Worker

## 2011-01-11 NOTE — Telephone Encounter (Signed)
Left message for [name] Rice to call social work.

## 2011-01-12 ENCOUNTER — Telehealth: Payer: Self-pay | Admitting: *Deleted

## 2011-01-12 NOTE — Telephone Encounter (Signed)
Pt called stating she was taken off several meds during last visit.  She was taken off Inderal and since then has started having tremors over entire body. This is why she was put on inderal by Dr Sandria Manly.  Onset of tremors Sunday.  Do you want to restart inderal?  She was taking inderal 60 mg bid  [facility] [address]

## 2011-01-13 NOTE — Telephone Encounter (Signed)
[name] Rice will come in tomorrow Friday March 30th at 10:15 AM for social work appmt.

## 2011-01-14 ENCOUNTER — Other Ambulatory Visit (INDEPENDENT_AMBULATORY_CARE_PROVIDER_SITE_OTHER): Payer: Medicare Other | Admitting: *Deleted

## 2011-01-14 ENCOUNTER — Telehealth: Payer: Self-pay | Admitting: Licensed Clinical Social Worker

## 2011-01-14 ENCOUNTER — Ambulatory Visit: Payer: Self-pay | Admitting: Licensed Clinical Social Worker

## 2011-01-14 DIAGNOSIS — F329 Major depressive disorder, single episode, unspecified: Secondary | ICD-10-CM

## 2011-01-14 DIAGNOSIS — F32A Depression, unspecified: Secondary | ICD-10-CM

## 2011-01-14 DIAGNOSIS — E119 Type 2 diabetes mellitus without complications: Secondary | ICD-10-CM

## 2011-01-14 MED ORDER — PROPRANOLOL HCL 60 MG PO CP24: 60.0000 mg | ORAL_CAPSULE | Freq: Every day | ORAL | Status: DC

## 2011-01-14 NOTE — Telephone Encounter (Signed)
Dr Odis Luster discontinued the propranolol due to depression and possible association to polypharmacy.  Overall, I would be ok with restarting at prior dose.  I will route to Dr. Odis Luster in case she disagrees but if patient was started on propranolol for tremors and currently symptomatic after stopping, I would think she would be ok with it.

## 2011-01-14 NOTE — Progress Notes (Signed)
Soc. Work.  90 minutes.  Counseling visit with [name].  Dr. Odis Luster reported that the patient was sleeping throughout the day and experiencing increased symptoms of depression.   Met with Foye in my office and find that she is her usual sweet and pleasant self.  She said that just recently she has made progress with her flip flopped sleeping patterns.  She is starting to go to bed at around 8 PM and then waking up at the 2 AM hour.  She will try to go to bed a little later and wake up at a more normal morning time.   She attributes this improvement with Dr. Odis Luster no longer prescribing the Ambien.   Derriona continues to struggle with grief and loss over the loss of her mother five years ago and also the traumatic death of her brother a few years later.  She has a lot of guilt because she thinks that she could have done something about her mother dying from pneumonia suddenly.  We processed her faulty thinking on this at length and Alonia's mother had many health issues including end stage kidney disease and Llana was a source of support to her mother and not a cause of her death.  Jlynn's mother passed away in 2023-05-04 and her mother's birthday was in February so these are important anniversary months for her.    Corynne talked about various family issues going on with her sister, niece, daughter,  And grandchildren and social work listened to those concerns and refocused on what was going very well with the family and what she could control vs not control.   Evalynne is receptive to meeting about twice per month and feels like this will be enough to help her stay on track.  We talked about getting an outside therapist to meet with her weekly however she feels like twice monthly counseling with social work will be enough and assured me she did not feel short-changed.  Next visit with be April 11th at 10:00 AM with social work.

## 2011-01-14 NOTE — Telephone Encounter (Signed)
Pt informed and needs refill.

## 2011-01-14 NOTE — Telephone Encounter (Signed)
Karleen has another SW appointment on April 11th at 10 AM.

## 2011-01-14 NOTE — Telephone Encounter (Signed)
Talked with Dr Odis Luster and she states pt can restart inderal at dose she was on. Will f/u on next visit 4/13.  Pt  Called and no answer.  Message left to call clinic.

## 2011-01-17 ENCOUNTER — Other Ambulatory Visit: Payer: Self-pay | Admitting: Internal Medicine

## 2011-01-23 LAB — GLUCOSE, CAPILLARY: Glucose-Capillary: 166 mg/dL — ABNORMAL HIGH (ref 70–99)

## 2011-01-25 LAB — GLUCOSE, CAPILLARY: Glucose-Capillary: 283 mg/dL — ABNORMAL HIGH (ref 70–99)

## 2011-01-26 ENCOUNTER — Ambulatory Visit: Payer: Medicare Other | Admitting: Licensed Clinical Social Worker

## 2011-01-26 LAB — CBC
HCT: 40.3 % (ref 36.0–46.0)
Platelets: 217 10*3/uL (ref 150–400)
RDW: 15.4 % (ref 11.5–15.5)

## 2011-01-26 LAB — DIFFERENTIAL
Basophils Relative: 0 % (ref 0–1)
Lymphs Abs: 1.8 10*3/uL (ref 0.7–4.0)
Monocytes Absolute: 0.6 10*3/uL (ref 0.1–1.0)
Monocytes Relative: 5 % (ref 3–12)
Neutro Abs: 8 10*3/uL — ABNORMAL HIGH (ref 1.7–7.7)

## 2011-01-26 LAB — URINALYSIS, ROUTINE W REFLEX MICROSCOPIC
Glucose, UA: NEGATIVE mg/dL
Ketones, ur: NEGATIVE mg/dL
Nitrite: NEGATIVE
Protein, ur: NEGATIVE mg/dL
Urobilinogen, UA: 0.2 mg/dL (ref 0.0–1.0)

## 2011-01-26 LAB — COMPREHENSIVE METABOLIC PANEL
Albumin: 3.4 g/dL — ABNORMAL LOW (ref 3.5–5.2)
Alkaline Phosphatase: 91 U/L (ref 39–117)
BUN: 18 mg/dL (ref 6–23)
Calcium: 8.9 mg/dL (ref 8.4–10.5)
Potassium: 3.7 mEq/L (ref 3.5–5.1)
Sodium: 143 mEq/L (ref 135–145)
Total Protein: 6.9 g/dL (ref 6.0–8.3)

## 2011-01-26 LAB — GLUCOSE, CAPILLARY
Glucose-Capillary: 146 mg/dL — ABNORMAL HIGH (ref 70–99)
Glucose-Capillary: 146 mg/dL — ABNORMAL HIGH (ref 70–99)
Glucose-Capillary: 152 mg/dL — ABNORMAL HIGH (ref 70–99)

## 2011-01-28 ENCOUNTER — Ambulatory Visit (INDEPENDENT_AMBULATORY_CARE_PROVIDER_SITE_OTHER): Payer: Medicare Other | Admitting: Internal Medicine

## 2011-01-28 DIAGNOSIS — F329 Major depressive disorder, single episode, unspecified: Secondary | ICD-10-CM

## 2011-01-28 DIAGNOSIS — G4700 Insomnia, unspecified: Secondary | ICD-10-CM

## 2011-01-28 DIAGNOSIS — F3289 Other specified depressive episodes: Secondary | ICD-10-CM

## 2011-01-28 DIAGNOSIS — E119 Type 2 diabetes mellitus without complications: Secondary | ICD-10-CM

## 2011-01-28 DIAGNOSIS — G250 Essential tremor: Secondary | ICD-10-CM

## 2011-01-28 MED ORDER — PROPRANOLOL HCL 20 MG PO TABS: 20.0000 mg | ORAL_TABLET | Freq: Two times a day (BID) | ORAL | Status: DC

## 2011-01-28 NOTE — Progress Notes (Signed)
Subjective:    Patient ID: [name] Rice, female    DOB: 01/12/1956, 55 y.o.   MRN: [id number]  HPI [name] presents today for follow-up of: 1. Depression. She reports improved mood since her last visit. She has been in touch more with her family, and her grandson spent a weekend with her. She still struggles to keep a regular schedule (she complains of having to stay awake 8am-11pm when her grandson visited; yesterday she didn't get up until 5pm). However, she has made a friend in her apartment complex and they plan to start taking regular walks together. 2. Insomnia. Reports improved sleep, less sleep latency and better ability to get up in the morning than last visit. She is also no longer using Ambien for sleep. As mentioned above, however, she still struggles to keep a regular daily schedule.  3. Polypharmacy. After the last visit, she has successfully stopped taking meclazine, methocarbamol, and Ambien. She is pleased that she has been feeling better and sleeping better since stopping these meds. She tried to stop propranolol, in the hopes that it would help depression; however, she had recurrence of tremor and had to restart.  4. Essential tremor. As discussed above, tremor recurred when she tried to stop propranolol. It has been well-controlled since restarting however.  5. Diabetes. She brought her meter with her today, which demonstrates fairly wide variability from day to day. Many readings in the 200s, no significant lows. She says that her use of lantus varies depending on the hours when she's awake on a given day (some days she injects insuling the morning, sometimes in the evening, sometimes she misses it entirely).   Review of Systems  Constitutional: Negative for fever, chills, appetite change and fatigue.  HENT: Negative for congestion and sore throat.   Respiratory: Negative for shortness of breath.   Cardiovascular: Negative for chest pain and palpitations.  Gastrointestinal:  Negative for nausea, vomiting, abdominal pain, diarrhea and constipation.  Genitourinary: Negative for dysuria.  Skin: Negative for rash.  Neurological: Negative for syncope, light-headedness and headaches.  Psychiatric/Behavioral: Positive for sleep disturbance and dysphoric mood. Negative for suicidal ideas, hallucinations and self-injury.       Objective:   Physical Exam  Constitutional: She is oriented to person, place, and time. No distress.  HENT:  Head: Normocephalic and atraumatic.  Mouth/Throat: Oropharynx is clear and moist. No oropharyngeal exudate.  Eyes: Conjunctivae and EOM are normal. Pupils are equal, round, and reactive to light. No scleral icterus.  Neck: Normal range of motion. Neck supple. No JVD present. No tracheal deviation present. No thyromegaly present.  Cardiovascular: Normal rate, regular rhythm, normal heart sounds and intact distal pulses.  Exam reveals no gallop and no friction rub.   No murmur heard. Pulmonary/Chest: Effort normal and breath sounds normal. No respiratory distress. She has no wheezes. She has no rales.  Abdominal: Soft. Bowel sounds are normal. She exhibits no distension. There is no tenderness. There is no rebound and no guarding.  Musculoskeletal: She exhibits no edema.  Lymphadenopathy:    She has no cervical adenopathy.  Neurological: She is alert and oriented to person, place, and time. No cranial nerve deficit.  Skin: Skin is warm and dry. No rash noted. She is not diaphoretic.  Psychiatric: Her behavior is normal. Judgment and thought content normal.       Still appears intermittently sad but not tearful as in past encounters.           Assessment & Plan:

## 2011-01-28 NOTE — Patient Instructions (Signed)
Keep a regular schedule, eat three times daily, get outside and exercise when you can, and check your blood sugars. Call us if you have any low blood sugars (<70).

## 2011-01-31 ENCOUNTER — Other Ambulatory Visit: Payer: Self-pay | Admitting: Internal Medicine

## 2011-01-31 LAB — GLUCOSE, CAPILLARY

## 2011-02-01 LAB — GLUCOSE, CAPILLARY: Glucose-Capillary: 177 mg/dL — ABNORMAL HIGH (ref 70–99)

## 2011-02-03 ENCOUNTER — Telehealth: Payer: Self-pay | Admitting: *Deleted

## 2011-02-03 NOTE — Telephone Encounter (Signed)
Talked to insurance [phone] - pt needs to try 2 of the following meds  Lodene, Mobic, Naprosyn, Toradol, Voltaren, Feldene or Ibuprofen before approving Celebrex. Flag sent to Dr Odis Luster.  Pt uses [facility] [address] [facility] Maximillian Habibi RN  02/02/11 4:20PM

## 2011-02-06 DIAGNOSIS — G4700 Insomnia, unspecified: Secondary | ICD-10-CM | POA: Insufficient documentation

## 2011-02-06 DIAGNOSIS — G250 Essential tremor: Secondary | ICD-10-CM | POA: Insufficient documentation

## 2011-02-06 NOTE — Assessment & Plan Note (Signed)
Controlled with propranolol.

## 2011-02-06 NOTE — Assessment & Plan Note (Signed)
Mood significantly improved from past visit. I am also encouraged that she has had more contact with her family and seems to be seeking friendships and other social contact. She reports somewhat better sleep patterns, but I am still concerned about her tendency to sleep all day and stay up all night. In addition to making it extremely difficult for her to take her medications in a consistent way, it also very socially isolating. I will continue to encourage her to work on creating a healthier daily routine and will follow up with her closely. Continue Cymbalta.

## 2011-02-06 NOTE — Assessment & Plan Note (Signed)
Patient reports improved ability to fall asleep. She is no longer using Ambien to sleep. However, she still often sleeps during the day rather than at night. Her insomnia is no doubt tightly related to depression and improves as her mood improves. I continue to encourage good sleep hygiene, regular exercise, and efforts to keep a regular daily routine.

## 2011-02-06 NOTE — Assessment & Plan Note (Signed)
Most recent HbA1C was 7.6, demonstrating slightly deteriorated control. Glucose meter demonstrates several readings in 200s. However, it is impossible to meaningfully interpret these readings as her use of diabetes meds, particularly Lantus insulin, is irregular. Depending on her sleep pattern (which varies widely), she may take her insulin in the morning or in the evening. Therefore, I will make no adjustments to regimen at this time, other than to emphasize the importance of a consistent routine and taking her meds at roughly the same time each day.

## 2011-02-15 ENCOUNTER — Other Ambulatory Visit (INDEPENDENT_AMBULATORY_CARE_PROVIDER_SITE_OTHER): Payer: Medicare Other | Admitting: Internal Medicine

## 2011-02-15 ENCOUNTER — Other Ambulatory Visit: Payer: Self-pay | Admitting: *Deleted

## 2011-02-15 ENCOUNTER — Telehealth: Payer: Self-pay | Admitting: Licensed Clinical Social Worker

## 2011-02-15 DIAGNOSIS — E119 Type 2 diabetes mellitus without complications: Secondary | ICD-10-CM

## 2011-02-15 NOTE — Telephone Encounter (Signed)
Called [name] Rice to see how she was doing.  She reported she is having back pain and has been on her back for most days this past week.  She said she can still handle her ADL's.  She's been trying to use TENS unit.   She has an appmt on 4/11 and also signed up for [name] group on April 8th.   Will continue to check-in with her and encourage continued group participation and social work contact.

## 2011-02-15 NOTE — Telephone Encounter (Signed)
Last refill 01/07/11

## 2011-02-16 MED ORDER — HYDROCODONE-ACETAMINOPHEN 5-325 MG PO TABS: 1.0000 | ORAL_TABLET | Freq: Four times a day (QID) | ORAL | Status: DC | PRN

## 2011-02-16 NOTE — Telephone Encounter (Signed)
Rx faxed in.

## 2011-02-17 ENCOUNTER — Emergency Department (HOSPITAL_COMMUNITY): Payer: Medicare Other

## 2011-02-17 ENCOUNTER — Emergency Department (HOSPITAL_COMMUNITY)
Admission: EM | Admit: 2011-02-17 | Discharge: 2011-02-17 | Disposition: A | Discharge status: Home / Self Care | Payer: Medicare Other | Attending: Emergency Medicine | Admitting: Emergency Medicine

## 2011-02-17 DIAGNOSIS — M545 Low back pain, unspecified: Secondary | ICD-10-CM | POA: Insufficient documentation

## 2011-02-17 DIAGNOSIS — R42 Dizziness and giddiness: Secondary | ICD-10-CM | POA: Insufficient documentation

## 2011-02-17 DIAGNOSIS — E11319 Type 2 diabetes mellitus with unspecified diabetic retinopathy without macular edema: Secondary | ICD-10-CM | POA: Insufficient documentation

## 2011-02-17 DIAGNOSIS — I10 Essential (primary) hypertension: Secondary | ICD-10-CM | POA: Insufficient documentation

## 2011-02-17 DIAGNOSIS — I509 Heart failure, unspecified: Secondary | ICD-10-CM | POA: Insufficient documentation

## 2011-02-17 DIAGNOSIS — Z87442 Personal history of urinary calculi: Secondary | ICD-10-CM | POA: Insufficient documentation

## 2011-02-17 DIAGNOSIS — Z794 Long term (current) use of insulin: Secondary | ICD-10-CM | POA: Insufficient documentation

## 2011-02-17 DIAGNOSIS — E1149 Type 2 diabetes mellitus with other diabetic neurological complication: Secondary | ICD-10-CM | POA: Insufficient documentation

## 2011-02-17 DIAGNOSIS — E1139 Type 2 diabetes mellitus with other diabetic ophthalmic complication: Secondary | ICD-10-CM | POA: Insufficient documentation

## 2011-02-17 DIAGNOSIS — E1142 Type 2 diabetes mellitus with diabetic polyneuropathy: Secondary | ICD-10-CM | POA: Insufficient documentation

## 2011-02-17 LAB — BASIC METABOLIC PANEL
Calcium: 9.8 mg/dL (ref 8.4–10.5)
Creatinine, Ser: 1.01 mg/dL (ref 0.4–1.2)
GFR calc Af Amer: >60 mL/min (ref >60)
GFR calc non Af Amer: 57 mL/min — ABNORMAL LOW (ref >60)
Sodium: 139 mEq/L (ref 135–145)

## 2011-02-18 ENCOUNTER — Ambulatory Visit (INDEPENDENT_AMBULATORY_CARE_PROVIDER_SITE_OTHER): Payer: Medicare Other | Admitting: Internal Medicine

## 2011-02-18 DIAGNOSIS — F329 Major depressive disorder, single episode, unspecified: Secondary | ICD-10-CM

## 2011-02-18 DIAGNOSIS — R42 Dizziness and giddiness: Secondary | ICD-10-CM

## 2011-02-18 DIAGNOSIS — E119 Type 2 diabetes mellitus without complications: Secondary | ICD-10-CM

## 2011-02-18 DIAGNOSIS — F3289 Other specified depressive episodes: Secondary | ICD-10-CM

## 2011-02-18 DIAGNOSIS — IMO0002 Reserved for concepts with insufficient information to code with codable children: Secondary | ICD-10-CM

## 2011-02-18 LAB — GLUCOSE, CAPILLARY: Glucose-Capillary: 147 mg/dL — ABNORMAL HIGH (ref 70–99)

## 2011-02-18 NOTE — Patient Instructions (Signed)
Keep a regular schedule by getting up in the morning at approximately the same time each day and going to bed at the same time each night. Try to take your medication at roughly the same time each day.   Get plenty of exercise and get outside into the sunshine at least once daily.

## 2011-02-22 ENCOUNTER — Telehealth: Payer: Self-pay | Admitting: *Deleted

## 2011-02-22 NOTE — Telephone Encounter (Signed)
Pt calls and states back pain is no better than 5/4 visit, she states she can hardly take it. She states movement aggravates the pain. Nothing has alleviated the pain. She wishes to have an appt, will schedule 5/9 at 0815 per chilon. Pt is agreeable

## 2011-02-23 ENCOUNTER — Ambulatory Visit: Payer: Medicare Other | Admitting: Internal Medicine

## 2011-02-23 NOTE — Assessment & Plan Note (Signed)
Vertigo seems exacerbated by recent viral illness. Patient may continue using meclizine as needed.

## 2011-02-23 NOTE — Assessment & Plan Note (Signed)
Chronic pain seems closely tied to depression. Often complains of pain when mood is bad. When mood is better as it is today has minimal complaints of pain.

## 2011-02-23 NOTE — Progress Notes (Signed)
Subjective:    Patient ID: [name] Rice, female    DOB: 20-Feb-1956, 55 y.o.   MRN: [id number]  HPI 55yo W presents for follow-up of: 1. Depression. Mood has been much better. Has had more involvement with family (lunch with daughter earlier today). Denies any suicidal thoughts. Still struggles with maintaining a regular sleep pattern but getting better. Will go a few days where she stays up during the day but then will sleep again until the late evening and then stay up all night.  2. Dizziness. Recently seen in the ED with worsened vertigo. She also had a brief diarrheal illness a few days ago. She had been doing quite well and had not needed meclizine for several weeks. However, she has had to take it while recovering from this illness.   3. DM2. Is doing a better job of being consistent with medications but still struggles with this when she sleeps all day.   Current Outpatient Prescriptions on File Prior to Visit  Medication Sig Dispense Refill  . albuterol (VENTOLIN HFA) 108 (90 BASE) MCG/ACT inhaler Inhale 2 puffs into the lungs every 6 (six) hours as needed.        Marland Kitchen allopurinol (ZYLOPRIM) 300 MG tablet Take 1 tablet (300 mg total) by mouth daily.  90 tablet  3  . CELEBREX 200 MG capsule TAKE 1 CAPSULE BY MOUTH ONCE DAILY  90 capsule  2  . DULoxetine (CYMBALTA) 20 MG capsule Take 20 mg by mouth daily.        . DULoxetine (CYMBALTA) 60 MG capsule Take 1 capsule (60 mg total) by mouth daily.  30 capsule  5  . Fluticasone-Salmeterol (ADVAIR DISKUS) 250-50 MCG/DOSE AEPB Inhale 1 puff into the lungs 2 (two) times daily.  60 each  5  . furosemide (LASIX) 40 MG tablet Take 1 tablet (40 mg total) by mouth daily.  90 tablet  3  . glucose blood (BL TEST STRIP PACK) test strip test sugar three times a day  100 each  12  . HYDROcodone-acetaminophen (NORCO) 5-325 MG per tablet Take 1 tablet by mouth every 6 (six) hours as needed.  60 tablet  0  . insulin aspart (NOVOLOG) 100 UNIT/ML injection Inject  40 Units into the skin 3 (three) times daily before meals.  10 mL  3  . Insulin Pen Needle (B-D UF III MINI PEN NEEDLES) 31G X 5 MM MISC 1 each by Does not apply route 3 (three) times daily.  100 each  11  . LANTUS 100 UNIT/ML injection INJECT 70 UNITS ONCE A DAY.  30 mL  2  . metFORMIN (GLUCOPHAGE) 1000 MG tablet Take 1 tablet (1,000 mg total) by mouth 2 (two) times daily with a meal.  180 tablet  3  . mometasone (NASONEX) 50 MCG/ACT nasal spray 2 sprays by Nasal route daily.        Marland Kitchen olopatadine (PATANOL) 0.1 % ophthalmic solution 1 drop 2 (two) times daily.        . pantoprazole (PROTONIX) 40 MG tablet Take 40 mg by mouth daily.        . pravastatin (PRAVACHOL) 40 MG tablet Take 1 tablet (40 mg total) by mouth daily.  30 tablet  2  . pregabalin (LYRICA) 100 MG capsule Take 1 capsule (100 mg total) by mouth 2 (two) times daily.  60 capsule  5  . propranolol (INDERAL) 20 MG tablet Take 1 tablet (20 mg total) by mouth 2 (two) times daily.  60 tablet  11  . SINGULAIR 10 MG tablet TAKE 1 TABLET BY MOUTH ONCE DAILY  96 tablet  2  . Syringe/Needle, Disp, (B-D ECLIPSE SYRINGE) 30G X 1/2" 1 ML MISC 1 Syringe by Does not apply route 3 (three) times daily.        Marland Kitchen triamcinolone (KENALOG) 0.5 % cream Apply topically 2 (two) times daily.  30 g  2    Past Medical History  Diagnosis Date  . Anxiety   . Arthritis   . Asthma   . COPD (chronic obstructive pulmonary disease)   . Depression   . Diabetes mellitus   . GERD (gastroesophageal reflux disease)   . Hypertension   . HLD (hyperlipidemia)   . OSA on CPAP 2008  . Dizziness   . History of colonic polyps   . Lumbar pain 2011    per x-ray - Multilevel spondylosis    Review of Systems  Constitutional: Negative for fever, chills and activity change.  HENT: Negative for congestion and tinnitus.   Eyes: Negative for visual disturbance.  Respiratory: Negative for cough, chest tightness, shortness of breath and wheezing.   Cardiovascular: Negative  for chest pain and palpitations.  Gastrointestinal: Negative for nausea, vomiting, abdominal pain, diarrhea, constipation and blood in stool.  Genitourinary: Negative for dysuria.  Musculoskeletal: Positive for gait problem.  Neurological: Positive for dizziness. Negative for tremors, weakness and numbness.  Psychiatric/Behavioral: Positive for sleep disturbance and dysphoric mood. Negative for suicidal ideas and self-injury. The patient is not nervous/anxious.        Objective:   Physical Exam  Constitutional: She is oriented to person, place, and time. No distress.  HENT:  Head: Normocephalic and atraumatic.  Right Ear: External ear normal.  Left Ear: External ear normal.  Mouth/Throat: Oropharynx is clear and moist. No oropharyngeal exudate.  Eyes: Conjunctivae and EOM are normal. Pupils are equal, round, and reactive to light. No scleral icterus.  Neck: Normal range of motion. Neck supple. No tracheal deviation present. No thyromegaly present.  Cardiovascular: Normal rate, regular rhythm, normal heart sounds and intact distal pulses.  Exam reveals no gallop and no friction rub.   No murmur heard. Pulmonary/Chest: Effort normal and breath sounds normal. No respiratory distress. She has no wheezes. She has no rales. She exhibits no tenderness.  Abdominal: Soft. Bowel sounds are normal. She exhibits no mass. There is no tenderness. There is no rebound and no guarding.  Musculoskeletal: She exhibits no tenderness.  Lymphadenopathy:    She has no cervical adenopathy.  Neurological: She is alert and oriented to person, place, and time.  Skin: Skin is warm and dry. No rash noted. She is not diaphoretic. No erythema.  Psychiatric: She has a normal mood and affect. Her behavior is normal. Judgment and thought content normal.       More cheerful than past visits.           Assessment & Plan:

## 2011-02-23 NOTE — Assessment & Plan Note (Signed)
Patient's mood has been doing much better and she has been less socially isolated. Believe we can longer until next follow-up (2 months instead of 1). Continue current meds and encourage patient to interact socially.

## 2011-02-25 ENCOUNTER — Encounter: Payer: Medicare Other | Admitting: Internal Medicine

## 2011-02-28 ENCOUNTER — Encounter: Payer: Self-pay | Admitting: Internal Medicine

## 2011-02-28 NOTE — Assessment & Plan Note (Signed)
Patient still struggles to use insulin consistently because of erratic sleep/wake cycle. Will try to use it consistently until next visit when will check A1C.

## 2011-03-01 NOTE — Assessment & Plan Note (Signed)
[facility]                         GASTROENTEROLOGY OFFICE NOTE   [name] Rice, [name] Rice                       MRN:          [id number]  DATE:07/11/2007                            DOB:          1956/01/11    REFERRING PHYSICIAN:  Valetta Close, M.D.   REASON FOR EVALUATION:  Belching.   HISTORY:  This is a pleasant though medically complicated 55 year old  white female with multiple medical problems including hypertension, COPD  and asthma, diabetes mellitus, morbid obesity, hyperlipidemia,  osteoarthritis, anxiety with depression, gastroesophageal reflux  disease, and adenomatous colon polyps.  She is referred through the  courtesy of the outpatient clinic regarding belching.  The patient has  chronic reflux disease as manifested by indigestion and heartburn.  For  this, she has been on proton pump inhibitors with good results.  This  past February, she developed problems with abdominal discomfort and  nausea.  This was attributed to Nexium.  Nexium was discontinued, and  she was changed to omeprazole 20 mg b.i.d.  On omeprazole, her classic  reflux symptoms remained well controlled.  Her abdominal pain and nausea  resolved.  No recurrent problem since.  She has, however, had chronic  problems with belching and gas.  She will describe abdominal or chest  discomfort which is immediately relieved with belching.  She denies  dysphagia.  No change in bowel habits, melena, or hematochezia.  Her  weight has been stable.  She has noticed that decreasing the consumption  of carbonated beverages has been helpful.  Review of old records finds  that she underwent colonoscopy and upper endoscopy with Dr. Roosvelt Harps in April 2004.  Upper endoscopy was performed for complaints  of anemia and dysphagia as well as reflux symptoms.  The examination was  entirely normal.  Colonoscopy was performed because of a history of  adenomatous colon polyps.  Her index  colonoscopy was with Dr. Sabino Gasser  in 1998.  The more recent colonoscopy revealed 2 diminutive polyps as  well as left-sided diverticulosis.  Followup in 5 years recommended.   PAST MEDICAL HISTORY:  Extensive as outlined above.  Also history of  kidney stones, sleep apnea, and panic disorder.   PAST SURGICAL HISTORY:  1. Pilonidal cyst.  2. Left knee surgery.  3. Tubal ligation.  4. Right hand surgery.  5. Left elbow surgery.  6. Sinus surgery.  7. Back surgery.  8. Carpal tunnel surgery.  9. Left hand surgery.   ALLERGIES:  1. CECLOR.  2. SULFA.   CURRENT MEDICATIONS:  1. Humulin insulin 70/30, 165 units subcu q.a.m. and 150 units subcu      q.p.m.  2. Humulin regular insulin sliding scale.  3. Metformin 1000 mg b.i.d.  4. Furosemide 80 mg daily.  5. Cardia XT 240 mg daily.  6. Omeprazole 20 mg b.i.d.  7. Allopurinol 300 mg daily.  8. Singulair 10 mg daily.  9. Fexofenadine 180 mg daily.  10.Advair 250 mg 2 puffs daily.  11.Nasonex 50 mcg 2 sprays daily.  12.Albuterol p.r.n.  13.Celebrex 200 mg daily.  14.Lyrica 50 mg t.i.d.  15.Darvocet p.r.n.  16.Aspirin 81 mg daily.  17.Cymbalta 80 mg daily.  18.Pravastatin 20 mg daily.  19.Triamcinolone cream.   FAMILY HISTORY:  No family history of gastrointestinal malignancy.  Mother and siblings with diabetes and heart disease.   SOCIAL HISTORY:  The patient is divorced with 1 daughter.  She lives  alone.  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  She quit smoking 10 years ago, does not use  alcohol.   REVIEW OF SYSTEMS:  Per diagnostic evaluation form.   PHYSICAL EXAM:  Chronically ill-appearing female in no acute distress.  She appears older than her age.  Blood pressure is 130/66, heart rate 80, weight 279.8 pounds.  She is 5  feet 4 inches in height.  HEENT:  Sclerae are anicteric.  Conjunctivae are  pink.  Oral mucosa is  intact.  No adenopathy.  LUNGS:  Clear.  HEART:  Regular.  ABDOMEN:  Obese and soft without tenderness, obvious mass, or hernia.  EXTREMITIES:  Without edema.   IMPRESSION:  1. Belching and burping due to aerophagia.  No worrisome features.  2. Gastroesophageal reflux disease.  Classic symptoms controlled on      Nexium.  Endoscopy in 2004 negative.  3. History of adenomatous colon polyps.  Due for followup in 2009.  4. Incidental diverticulosis.  5. Multiple significant medical problems as discussed above.   RECOMMENDATIONS:  1. Continue omeprazole for reflux disease.  2. Reassurance with regard to belching.  As well, information has been      provided on gas as well as an anti-gas dietary sheet.  3. Surveillance colonoscopy in 2009, if medically fit.  4. Resume general medical care with the medical outpatient clinic.     Wilhemina Bonito. Marina Goodell, MD  Electronically Signed    JNP/MedQ  DD: 07/11/2007  DT: 07/12/2007  Job #: [id number]   cc:   Valetta Close, M.D.  Elby Showers, MD

## 2011-03-01 NOTE — Discharge Summary (Signed)
[name] Rice, KLICH                ACCOUNT NO.:  [id number]   MEDICAL RECORD NO.:  [id number]          PATIENT TYPE:  INP   LOCATION:  4739                         FACILITY:  [facility]   PHYSICIAN:  Fransisco Hertz, M.D.  DATE OF BIRTH:  10-Sep-1956   DATE OF ADMISSION:  02/10/2009  DATE OF DISCHARGE:  02/11/2009                               DISCHARGE SUMMARY   DISCHARGE DIAGNOSES:  1. Orthostatic hypotension with a drop in systolic blood pressure of      35, resolved with hydration, etiology most likely secondary to      decreased p.o. intake, blood pressure medications, and possible      autonomic insufficiency.  2. Significant depression, no suicidal or homicidal ideation, but not      ideally controlled with Cymbalta.  3. Diabetes mellitus type 2, insulin dependent.  4. Hypertension.  5. Chronic obstructive pulmonary disease.  6. Recent dizziness, likely secondary to orthostatic hypotension.  7. History of anxiety and panic disorders.  8. Hyperlipidemia.  9. Arthritis.  10.Iron deficiency.  11.Obstructive sleep apnea, on continuous positive airway pressure.  12.Gastroesophageal reflux disease.   DISCHARGE MEDICATIONS:  Please note that the patient's Cardizem and  Lasix were held until outpatient clinic followup.  The remaining  medications were unchanged and include the following below:  1. Lantus 75 units daily.  2. Advair 250-50 Diskus b.i.d.  3. Metformin 1000 mg b.i.d.  4. Allopurinol 300 mg daily.  5. Singulair 10 mg daily.  6. Nasonex as needed.  7. Celebrex 200 mg daily.  8. Cymbalta 80 mg daily.  9. Darvocet-N 100 q.12 h. p.r.n. pain.  10.Pravachol 40 mg p.o. daily.  11.Ventolin inhaler.  12.NovoLog 40 units t.i.d. with meals.  13.Protonix 40 mg daily.  14.Inderal 60 mg t.i.d.   CONDITION ON DISCHARGE:  The patient was hydrated and orthostatics were  taken on the morning of discharge and were within normal limits.  The  patient was close to her baseline and  was suitable for discharge.  She  has a number of chronic medical problems including severe depression  that will be continued to be managed in the outpatient setting.  She has  an appointment to follow up with myself, [name], and has an  appointment scheduled on Feb 23, 2009, at 9 a.m.  At that appointment,  her blood pressure should be evaluated and the addition of  antihypertensives could be retaken at that time.  For now, the patient  was instructed to discontinue her use of Lasix as well as Cardizem.   CONSULTATIONS:  None.   IMAGING:  The patient had a 2-view chest x-ray that was normal.   The patient also has a 2-D echo, the results of which are currently  pending.   HISTORY OF PRESENT ILLNESS:  The patient is a 55 year old female with  depression, insulin-dependent diabetes, COPD, hypertension, and  essential tremor, who presents to the Outpatient Clinic for a 54-month  history of lightheadedness.  She reports that she feels lightheaded  whenever she stands, but has not come close to passing out or falling.  The patient states that the symptom has been consistent over the past 6  months without worsening or improvement.  The patient says that she has  had low p.o. intake lately secondary to cost of food and general  depression.  The patient denies any nausea or vomiting, but does report  some episodes of diarrhea with as many as 6 bowel movements yesterday.  She denies any chest pain, recent shortness of breath, or palpitations.  The patient also reports 1.5-2 month history of brief (15-20 seconds)  vertigo when she lies down.  The patient says that she initially thought  that this symptom was related to recent sinus infection that was started  in mid March, around the time of the symptoms begin, with resolution of  sinus symptoms about 1 week ago.   The patient has been severely depressed over the past 2 years due to  recent loss of her brother and mother.  She initially  benefited from the  Cymbalta, but her depression has worsened lately.  She reports that she  sleeps for up to 36 hours at a time and will wake up during this.  But  then realizes that she has nothing to do so.  She goes back to bed.  The  patient has lost 15 pounds over the past 8 months.   PHYSICAL EXAMINATION:  VITAL SIGNS:  Supine blood pressure:  136/86 with  a heart rate of 106, standing blood pressure of 101/72 with a heart rate  of 114.  OTHER VITAL SIGNS:  Temperature of 98.3.  HEENT:  Mouth, mildly dry mucous membranes.  Pink oropharynx.  LUNGS:  Clear to auscultation bilaterally.  HEART:  Borderline tachycardiac with regular rhythm.  No murmurs, rubs,  or gallops.  ABDOMEN:  Obese, nontender, and nondistended.  NEUROLOGIC:  Alert and oriented x3.  Cranial nerves II-XII intact and  strength normal in the lower extremities.  Sensation intact except for  distal diabetic neuropathy.  PSYCH:  Depressed and tearful.   ADMISSION LABORATORY DATA:  Sodium 143, potassium 3.7, chloride of 103,  bicarbonate 29, glucose of 97, BUN of 18, creatinine of 0.97, AST of 24,  ALT of 20, alkaline phosphatase 91, total protein 6.9, albumin 3.4,  calcium 8.9, and magnesium of 1.4.  White blood cell count 10.7,  hemoglobin 13.5, and platelets of 217.  UA unremarkable.   HOSPITAL COURSE:  1. Orthostasis with resultant dizziness:  The patient initially      complained of a 15-month history of dizziness upon standing and was      found to be orthostatic in the clinic.  It is not clear that the      patient had been orthostatic for this entire period contributing to      the patient's symptoms.  However, given that she was orthostatic,      she was admitted to the [facility] for hydration and      clinical followup.  She was hydrated initially in the clinic with a      1 liter of bolus of normal saline and then put on 150 mL/hour of      normal saline.  She responded well to this and her  orthostatics      were rechecked on the morning of discharge and her supine blood      pressure was 116/55 with a heart rate of 77 and then her standing      blood pressure was 116/53 with a heart rate  of 53.  She reported      decreased dizziness upon standing with her hydration.  The etiology      of her orthostasis is not clear, but it is likely multifactorial.      For one, the patient has had decreased p.o. intake secondary to      finances as well as general depression.  Additionally, the patient      is on Lasix and Cardizem as well as propranolol.  These medications      were held and she will be held off Lasix and Cardizem upon      discharge.  We will continue with her propranolol as it helped with      her essential tremor.  Another possible contributing factor may be      autonomic insufficiency as the patient is diabetic.  She will      likely need to take more care upon standing to prevent future      accidents.  2. Hypokalemia and hypomagnesemia:  The patient was admitted with a      potassium of 3.7 and later magnesium was checked which was 1.4.  It      is not clear of the etiology of her low magnesium, but her poor      p.o. intake may be contributing.  She was repleted with 2 g of      magnesium sulfate IV and she should have her magnesium rechecked on      hospital followup.  3. Depression:  The patient apparently suffers from severe depression      related to the loss of her mother and brother as well as feeling      isolated.  We discussed at some length helpful ways to reduce her      depression including becoming more active in church-related      activities or other social events.  She will continue on her      Cymbalta for now and we will readdress in the Outpatient Clinic.      She may benefit from outpatient psychiatric evaluation and      counseling.  4. Diabetes mellitus type 2:  The patient was admitted and we reduced      her Lantus to 50 units and held  her metformin and put her on      resistant sliding scale.  Her blood sugars were very well      controlled on just the Lantus ranging from 134 to a peak of 193.      She was continued on her home medications as she says she checks      her blood sugars 3 times a day and she has not had any lows      recently.  This should be further evaluated in the outpatient      setting and the patient was asked to bring her blood sugars with      her to the next appointment.   DISCHARGE VITAL SIGNS:  Temperature 97.9, blood pressure of 116/55,  pulse of 77, respiratory rate of 22, and O2 sat 96%.   DISCHARGE LABORATORY DATA:  These are the same as admission labs.   PENDING LABORATORY DATA:  Please follow up on the patient's 2-D echo  from this hospitalization.      Linward Foster, MD  Electronically Signed      Fransisco Hertz, M.D.  Electronically Signed    LW/MEDQ  D:  02/11/2009  T:  02/12/2009  Job:  [id number]   cc:   Elby Showers, MD

## 2011-03-04 NOTE — Discharge Summary (Signed)
[name] Rice, [name] Rice                ACCOUNT NO.:  [id number]   MEDICAL RECORD NO.:  [id number]          PATIENT TYPE:  INP   LOCATION:  5727                         FACILITY:  [facility]   PHYSICIAN:  Madaline Guthrie, M.D.    DATE OF BIRTH:  November 30, 1955   DATE OF ADMISSION:  05/10/2005  DATE OF DISCHARGE:  05/15/2005                                 DISCHARGE SUMMARY   DISCHARGE DIAGNOSES:  1.  Pnuemonia with hypoxia and COPD exacerbation (on home oxygen therapy).  2.  Type 2 diabetes mellitus with diabetic neuropathy.  3.  Hypertension.  4.  Obesity.  5.  Obstructive sleep apnea, on continuous positive airway pressure.  6.  Gastroesophageal reflux disease.  7.  Normocytic iron deficiency anemia.  8.  Osteoarthritis.  9.  Uric acid stones.  10. Recurrent sinusitis.   DISCHARGE MEDICATIONS:  1.  Moxifloxacin 400 mg p.o. daily x10 days.  2.  Atenolol 25 mg p.o. daily.  3.  Lasix 80 mg p.o. daily.  4.  Flexeril 5 mg p.o. daily.  5.  Aspirin 81 mg p.o. daily.  6.  Protonix 40 mg p.o. daily.  7.  Singulair 10 mg p.o. daily.  8.  Allopurinol 300 mg p.o. daily.  9.  NPH insulin 40 units a.m. and 100 mg p.m.  10. Potassium chloride 40 mEq p.o. b.i.d.  11. Atrovent two puffs q.6 h. p.r.n.  12. Albuterol one to two puffs q.6 h. p.r.n.   DISPOSITION AND FOLLOWUP:  The patient will return to the [facility] on May 21, 2005 to have her BMET checked as well as to clinically assess her pneumonia.  The patient will follow up with Dr. Mont Dutton in the outpatient clinic on  May 31, 2005 with a chest x-ray and a BMET.   PROCEDURES PERFORMED:  None.   CONSULTATIONS:  None.   ADMITTING HISTORY OF PRESENT ILLNESS:  [name] Rice, a 55 year old white  female with a past medical history significant for COPD, obstructive sleep  apnea and recurrent sinusitis, presented with a history of 4 days of  fever/chills and 3 days of productive cough.  She had vague symptoms of URI  with sinusitis; she was given  amoxicillin, but her fever  and chills and  productive cough did not subside, prompting her to seek medical attention at  [facility].   PHYSICAL EXAMINATION:  VITAL SIGNS:  On examination, her temperature was  102.9, BP 153/55, pulse 116, respiratory rate 24, O2 SATS 92% on room air.  Her weight was 253 pounds.  GENERAL:  She was morbidly obese, in no acute distress.  EYES:  Examination normal.  ENT:  JVD not visible.  Clear oropharynx.  Neck:  Normal.  RESPIRATORY:  Bilaterally coarse breath sounds, especially over the left  lower lobe.  CARDIOVASCULAR:  She was tachycardic, S1 and S2 normal.  GI:  Bowel sounds present.  Nontender, non-distended.  No masses.  EXTREMITIES:  Pedal edema 1+.  Radial pulses palpable.  SKIN:  Normal.  NEUROLOGIC:  Cranial nerves grossly intact.   LABORATORIES:  CBC showed a hemoglobin of  10.9, WBC 11.8, hematocrit 34, MCV  79.2 and platelets of 231,000.  BMET showed a sodium of 137, potassium 4,  chloride 102, CO2 23, glucose 124, BUN 14, creatinine 1 and calcium 8.5.  Her liver function tests showed a bilirubin total of 0.6, direct bilirubin  0.2, indirect bilirubin 0.4, alkaline phosphatase 89, SGOT 32, SGPT 31 and a  total protein of 6.2, albumin 2.5.  Her cardiac panel showed a CK of 139, CK-  MB of 1.1, a relative index of 0.8 and troponin I of 0.04.  Other labs:  Urinalysis was normal.  Legionella antigen was negative.  ABG showed a pH of  7.46, PCO2 31.9, PO2 69.9, bicarb of 24.4.  Urine culture:  No growth.  Blood culture:  No growth.   RADIOLOGIC FINDINGS:  Chest x-ray done showed left upper lobe pneumonia.   HOSPITAL COURSE:  PROBLEM #1 - PNEUMONIA:  The patient had clinical signs  and symptoms suggestive of pneumonia.  A chest x-ray done showed left upper  lobe infiltrate.  She was on IV moxifloxacin.  Cultures were negative.  The  patient improved on IV moxifloxacin.  Her shortness of breath, fever and  cough with productive  sputum subsided.  She was sent home on Avelox for a  total of 14 days.   PROBLEM #2 - DIABETES MELLITUS:  The patient was on 70/30 insulin, very high  doses of 70/30 Humulin, 150 a.m., 150 p.m., and Humulin R at lunch.  She was  also getting Glucophage.  Because of constantly high CBGs in the hospital,  her insulin was changed to NPH 40 in the a.m. and 100 in the night.  At the  time of discharge, her blood sugars were running between 104 and 201.  She  will need to be followed up in the outpatient clinic and her insulin  requirements titrated based on her CBGs.   PROBLEM #3 - HYPOKALEMIA:  The patient was on high dose of diuretics.  Potassium deficiency was corrected and she was put on oral replacement  potassium when she was sent home.  She will need regular checks of her BMET  because of the high dose of diuretics as well as the potassium supplement  she is on.   PROBLEM #4 - HYPERTENSION:  Her blood pressure was stable throughout the  admission and she was continued on her home medications.   PROBLEM #5 - CHRONIC OBSTRUCTIVE PULMONARY DISEASE:  She was getting  Atrovent and albuterol nebulizers with oxygen and she was on CPAP.  At the  time of discharge, she was sent home on those same medications and home  oxygen.   DISCHARGE LABORATORIES:  1.  Chest x-ray showed persistent left upper lobe opacity.  2.  Other labs:  Final blood cultures were negative.  Her CBC was WBC 8.9,      hemoglobin 10, hematocrit 31, MCV 78.5, platelet count 392,000.  Her      BMET:  Sodium 142, potassium 3.2, chloride 107, CO2 28, glucose 87, BUN      13, creatinine 0.8 and calcium 8.4.      [name] Rice, M.D.      Madaline Guthrie, M.D.  Electronically Signed    YB/MEDQ  D:  05/19/2005  T:  05/20/2005  Job:  [id number]   cc:   Mont Dutton, MD  [facility]Internal Med. Resident  [place]  [place] [address]  Fax: [phone]

## 2011-03-04 NOTE — Op Note (Signed)
[facility]  Patient:    [name] Rice, [name] Rice Visit Number: [id number] MRN: [id number]          Service Type: DSU Location: DAY Attending Physician:  Ellwood Handler Proc. Date: 06/05/01 Adm. Date:  06/05/2001   CC:         Blanch Media, M.D., [facility] Outpatient [facility].   Operative Report  DATE OF BIRTH:  1956/09/07  PRIMARY CARE PHYSICIAN:  Blanch Media, M.D.  PREOPERATIVE DIAGNOSIS:  10 mm faintly calcified stone, left ureteropelvic junction.  BODY HABITUS:  5 feet 2 inches tall, 184 pounds.  POSTOPERATIVE DIAGNOSIS:  10 mm faintly calcified stone, left ureteropelvic junction.  PROCEDURE:  Cystoscopy, retrograde left double-J stent.  ANESTHESIA:  General.  DRAINS:  6 French 24 cm double-J stent.  INDICATIONS:  A 55 year old female, diabetic, seen and evaluated at [facility], April 26, 2001.  At that time, the patient had an episode of right-sided pyelonephritis which had been associated with spontaneous passage of a right ureteral calculus.  Incidental note was made of a 10 mm calcification in the left renal pelvis.  Once the patient had passed her distal right ureteral calculus and had recovered from her episode of right-sided pyelonephritis, follow-up studies showed a very faintly calcified 10 mm stone in the region of the left UPJ.  Plans were made for double-J stent in anticipation of ESWL.  DESCRIPTION OF PROCEDURE:  The patient was prepped and draped in the dorsal lithotomy position after institution of an adequate level of general anesthesia.  A well-lubricated 21 French panendoscope was gently inserted at the urethral meatus.  Normal urethra and sphincter, normal trigone and orifices.  Right retrograde showed no evidence of filling defect or obstruction within the ureter, pelvis, or calices.  Prompt drainage at 3-5 minutes.  Left retrograde showed a 10 mm filling defect within the left renal pelvis consistent  with stone noted on CT April 26, 2001.  A guidewire was advanced into the upper pole calices.  The 6 French 24 cm double-J stent was advanced over the indwelling guidewire with excellent pigtail formation on guidewire removal.  The bladder was drained; the cystoscope was removed. String was not left on the double-J stent.  The patient was given a B&O suppository and returned to recovery in satisfactory condition. Attending Physician:  Ellwood Handler DD:  06/05/01 TD:  06/05/01 Job: [id number]

## 2011-03-04 NOTE — Op Note (Signed)
[name] Rice, [name] Rice                ACCOUNT NO.:  [id number]   MEDICAL RECORD NO.:  [id number]          PATIENT TYPE:  AMB   LOCATION:  [facility]                         FACILITY:  [facility]   PHYSICIAN:  Marlowe Kays, M.D.  DATE OF BIRTH:  1956-06-22   DATE OF PROCEDURE:  08/25/2005  DATE OF DISCHARGE:                                 OPERATIVE REPORT   PREOPERATIVE DIAGNOSES:  1.  Symptomatic left trigger thumb.  2.  Left carpal tunnel syndrome.   POSTOPERATIVE DIAGNOSES:  1.  Symptomatic left trigger thumb.  2.  Left carpal tunnel syndrome.   OPERATION:  1.  Release A1 pulley, left thumb.  2.  Decompression median nerve left wrist and hand.   SURGEON:  Marlowe Kays, M.D.   ASSISTANT:  Nurse.   ANESTHESIA:  General.   PATHOLOGY AND JUSTIFICATION FOR PROCEDURE:  She had signs and symptoms of  carpal tunnel syndrome on the left with moderate median nerve compression on  nerve conduction studies. She also had a painful trigger thumb and because  she was here for the carpal tunnel surgery, requested release of it as well.   PROCEDURE:  Satisfactory general anesthesia, pneumatic tourniquet, left  upper extremity was esmarched out nonsterilely and prepped from mid forearm  to fingertips with DuraPrep, draped in a sterile field. I marked out a  transverse incision at the flexor MP crease for the thumb and the usual  carpal tunnel incision curved along the base of thenar eminence across the  flexor crease of the wrist and the distal forearm. The palmaris longus  tendon was identified at the wrist and protected. With blunt dissection, I  identified the median nerve in protecting it. I released the skin,  subcutaneous tissue and fascia into the distal palm. Potential bleeders were  coagulated with bipolar cautery. She had fairly significant compression of  the fascia on the median nerve in the canal. The wound  was irrigated with  sterile saline and the wound closed with skin and   subcutaneous tissue  sutures of 4-0 nylon mattress sutures. I then made the transverse incision  superficially on the flexor thumb and with blunt dissection went down to the  flexor tendon and A1 pulley protecting the neurovascular structures to  either side. I then carefully opened the tendon sheath with a 15 knife blade  and under direct visualization released the sheath with small scissors as  far proximally as necessary for release of any compression. I then opened  the A1 pulley for 5 mm or more distally until the compression had been  released there as well. I demonstrated this with flexion/extension of the  thumb and also I could place a hemostat beneath the residual A1 pulley  distally. This wound was also irrigated well with sterile saline and the  skin and subcutaneous tissue closed with 4-0 nylon mattress sutures.  Betadine Adaptic dry sterile dressing  and a volar plaster splint for the carpal tunnel were applied. The  tourniquet was released, she tolerated the procedure well and at the time of  this dictation she was on her way  to the recovery room in satisfactory  condition with no known complications.           ______________________________  Marlowe Kays, M.D.     JA/MEDQ  D:  08/25/2005  T:  08/26/2005  Job:  [id number]

## 2011-03-04 NOTE — Discharge Summary (Signed)
NAME:  [name] Rice, [name] Rice                          ACCOUNT NO.:  [id number]   MEDICAL RECORD NO.:  [id number]                   PATIENT TYPE:  INP   LOCATION:  3708                                 FACILITY:  [facility]   PHYSICIAN:  Asencion Partridge, M.D.                  DATE OF BIRTH:  01-Aug-1956   DATE OF ADMISSION:  12/18/2003  DATE OF DISCHARGE:  12/20/2003                                 DISCHARGE SUMMARY   PRIMARY CARE PHYSICIAN:  [facility], Dr. Cleta Alberts.   REFERRING PHYSICIAN:  Dr. Cleta Alberts.   CONSULTING PHYSICIAN:  None.   FINAL DIAGNOSES:  1. Obesity/hyperventilation syndrome.  2. Obesity.  3. Obstructive sleep apnea.  4. Hypertension.  5. Diabetes mellitus, type 2.  6. History of herniated disk.  7. History of gout.  8. History of allergic rhinitis.  9. Gastroesophageal reflux disease.  10.      Normocytic iron deficiency anemia.  11.      Osteoarthritis.   PRINCIPAL PROCEDURES:  Echocardiogram which showed an ejection fraction of  75-85%, a hyperdynamic left ventricle, systolic function, and a mildly  increased left ventricular wall thickness.   ADMISSION HISTORY AND PHYSICAL:  Please see dictation for full details.  However, in short, this is a 55 year old white female with a history of  obesity, hypertension, diabetes, asthma, and obstructive sleep apnea, who  presented to [facility] on December 08, 2003, for cough, shortness  of breath, lower extremity edema, elevated blood sugars.  At that visit, she  was found to have a white count of 14, BUN of 15, potassium 3.0, O2 94 on  room air, weight 262.  Hemoglobin was 12.1 with an MCV of 73 and platelets  of 363 at that time.  She was started on Actos.  Her Lasix was increased to  80 mg daily, and she was also restarted on her Advair.  She returned under  medical care on the day of admission with worsening dyspnea, continued cough  which was nonproductive of sputum.  She reported temperatures in the 99  range with no chills, no chest pains, but frequent palpitations.  She had  reported over the past several weeks __________, short of breath, and  hyperventilating which improves after sitting on the edge of the bed but  recurs when she lies back down.  She also acknowledges 3-pillow orthopnea  which recently increased from 2-pillow.  She denied nasal congestion but  endorsed rhinorrhea.  She had been using albuterol 1 time a day which is  down from 2 times a day prior to February 21.  The peak flow at [facility] today was 220 and where she was also found to have an O2  saturation of 91% on room air and was given an albuterol nebulizer  treatment.  Her weight on the day of admission was 257 pounds, and her  platelets  were 84.   LABORATORY DATA:  BNP 30.  Sodium 135, potassium 3.7, chloride 97, CO2 31,  glucose 162, BUN 0.9, creatinine 0.8.  T. Bili 0.5, alk phos 94, AST 45, ALT  49.  T protein 6.1, albumin 2.8, calcium 8.5.  ABG:  pH 7.417, pCO2 45.5,  pO2 61.4, bicarb 28.7; this was on room air.  Ferritin 21, iron 19, TIBC  397, %/saturation 5%.  Cardiac panel negative x 3.  Hemoglobin A1c 9.1.  Total cholesterol 157, triglycerides 198, HDL 40, LDL 77, TSH 2.383, D-dimer  0.41.   HOSPITAL COURSE:  #1 - HYPERVENTILATION SYNDROME:  Ms. Blomberg was admitted  and given 1 dose of 80 mg IV Lasix which she responded with very good urine  output overnight and decreased lower extremity edema.  We were concerned  that this increased edema and shortness of breath and dyspnea on exertion  was possibly secondary to congestive heart failure or was of cardiac origin.  Given her BNP of less than 30, CHF was now much less likely.  Her TSH was  within normal limits, and her EKG showed no changes.  Her cardiac enzymes  were negative x 3, and she was monitored on telemetry throughout her stay  with no abnormalities.  A 2 D echocardiogram was performed which was within  normal limits with the  exception of the above-noted abnormalities listed in  procedures.  We were concerned this could also be secondary to PE.  She was  slightly tachycardic and hypoxic on admission; however, her D-dimer was  within normal limits, and her lower extremity edema was symmetric  bilaterally.  We feel that her dyspnea on exertion and oxygen requirement is  likely secondary to a combination of obesity, hypoventilation syndrome and  obstructive sleep apnea.  She cannot wear a CPAP, as she becomes very  claustrophobic and has panic attacks.  This occurred also throughout her  hospital stay with nasal cannula oxygen.  We attempted to get pulmonary  function tests; however, there was going to be a great delay in acquiring  these, and so these were not obtained prior to discharge.  It is our  recommendation that she get pulmonary function tests as an outpatient to  further verify her diagnosis of obesity/hypoventilation syndrome; however,  given her blood gas with an elevated pCO2 and a markedly decreased pO2, this  is a very likely diagnosis.  We sent the patient home on oxygen and with a  walker and encouraged the patient to lose weight.   #2 - DIABETES MELLITUS:  Under poor control.  We increased her regular  insulin to 95 units subcu b.i.d.  The patient expressed interest of adding  sliding-scale regular with meals, and I informed her that she could decide  to do this under Dr. Ellis Parents guidance.  She was not restarted on her Actos,  as she has had problems with edema with this medication in the past.   #3 - HYPERTENSION:  We restarted the patient on her atenolol which she had  self-discontinued a few days prior to admission.  She tolerated it well  throughout her hospital stay.   #4 - ANEMIA:  Iron studies were performed.  Results as above.  The patient  was again shown to be iron deficient.  She had been on iron in the past but stopped taking it secondary to difficulties with taking it on an empty   stomach, as she states she likes to eat all the time and makes  it difficult  for her to be able to eat when she wants.  I encouraged the patient that  even if she took it with food, she was still getting a 50% absorption which  although it is poor, it is some absorption.  She agreed to start taking the  medicine again.  I encountered her to take Colace or other substitute stool  softener over-the-counter to prevent constipation.   #5 - ASTHMA:  She had a pre and post treatment here which showed 180 pre and  210 post, and she was sent home with albuterol and instructions on it to  use.  The patient was started on BuSpar with the hopes of helping her deal  with anxiety related to the claustrophobia with nasal cannula oxygen.   DISCHARGE MEDICATIONS:  1. Metformin 500 mg q.i.d.  2. Atenolol 25 mg 1 tab p.o. daily.  3. Insulin 70/30, 95 units 1 tab p.o. b.i.d.  4. Lasix 80 mg 1 tab p.o. daily.  5. Allopurinol 300 mg 1 tab p.o. daily.  6. Singulair 10 mg 1 tab p.o. daily.  7. Nexium 40 mg 1 tab p.o. daily.  8. Zyrtec 10 mg 1 tab p.o. daily.  9. Albuterol MDI with spacer p.r.n.  10.      Rhinocort nasal spray daily.  11.      Advair 250/50, 1 puff daily.  12.      Qvar 80 mcg p.r.n.  13.      BuSpar 7.5 mg b.i.d. x 1 week, then 10 mg b.i.d.  14.      Iron 325 mg t.i.d. on an empty stomach.   INSTRUCTIONS:  1. To patient's family and primary care physician.  The patient will need     pulmonary function tests as an outpatient and likely pulmonary rehab;     however, she will need her pulmonary function tests in order to qualify     for this.  It is our recommendation that she learn to tolerate CPAP and     that she lose weight.  We have also started the patient on BuSpar in     hopes of helping with her anxiety and her claustrophobia related to the     oxygen and CPAP.  We feel that this patient would benefit greatly from     pulmonary rehab and will need pulmonary function tests as an  outpatient     to qualify the patient for such services.  2. DNR status:  Full code.      Penni Bombard, MD                          Asencion Partridge, M.D.    SJ/MEDQ  D:  12/20/2003  T:  12/22/2003  Job:  [id number]   cc:   Brett Canales A. Cleta Alberts, M.D.  [address]  [place]  [place] [address]  Fax: [phone]

## 2011-03-04 NOTE — H&P (Signed)
NAME:  [name] Rice, [name] Rice                          ACCOUNT NO.:  [id number]   MEDICAL RECORD NO.:  [id number]                   PATIENT TYPE:  INP   LOCATION:  5731                                 FACILITY:  [facility]   PHYSICIAN:  Asencion Partridge, M.D.                  DATE OF BIRTH:  July 31, 1956   DATE OF ADMISSION:  12/18/2003  DATE OF DISCHARGE:                                HISTORY & PHYSICAL   PRIMARY CARE PHYSICIAN:  [facility].   CHIEF COMPLAINT:  Dyspnea and cough.   HISTORY OF PRESENT ILLNESS:  The patient is a 55 year old white female with  a history of obesity, hypertension, diabetes, asthma, and obstructive sleep  apnea who presented to [facility] on December 08, 2003, for cough,  shortness of breath, lower extremity edema, and elevated blood sugars.  At  that visit, she was found to have a white count of 14.1, BUN 15, potassium  3.3, O2 saturations 94% on room air, weight 262 pounds, hemoglobin 12.1 with  MCV of 73, and platelets 363.  She was started on Actos. Her Lasix was  increased to 80 mg daily and she was also restarted on her Advair.  She  returned to [facility] today for worsening dyspnea, continued  cough, now productive of sputum.  She reported temperatures in the 99 range,  no chills, no chest pain, but frequent palpitations.  She has reported over  the past several weeks waking up short of breath and hyperventilating which  improves after sitting on the edge of the bed, but recurs when she lies back  down.  She also endorses three pillow orthopnea which recently increased  from two pillow.  No nasal congestion, but does endorse rhinorrhea.  She has  been using albuterol about one time a day which is down from two times a day  prior to February 21.  The peak flow at [facility] was 220 today.  There, she was also found to have an O2 saturation of 91% on room air and  was given albuterol nebulizer treatment.  Her weight today  was also  documented at 257 pounds.  Her platelets were found to be 84.  I was called  for admission of this patient, to figure out the cause of her dyspnea and in  addition, Brett Canales A. Daub, M.D. was requesting a cardiac workup as she has no  prior history of this.   PAST MEDICAL HISTORY:  1. Obesity.  2. Hypertension.  3. Type 2 diabetes under poor control on insulin and oral agents.  4. Asthma.  5. Obstructive sleep apnea.  The patient has been unable to tolerate CPAP     secondary to claustrophobia.  6. Osteoarthritis, diffuse joints.  7. Sciatica.  8. Normocytic anemia previously treated as iron deficiency in the past.  9. History of hemorrhoids and fissures, chronic diarrhea.  10.      Herniated disk at L5-S1 on the left.  11.      Right pyelonephritis and right ureteral calculus in 2002.  12.      Postmenopausal (menopause around age 82).  13.      Yearly pneumonia since 1996.  14.      Gastroesophageal reflux disease and hiatal hernia.  15.      Allergies.  16.      ? Gout.   PAST SURGICAL HISTORY:  1. Cystoscopy in 2002 with stent placement.  2. Microdiskectomy of L5 to S1 on the left by Marlowe Kays, M.D. in     October of 2001.  3. Right pinky finger surgery secondary to infection.  4. Left knee arthroscopy.  5. Left elbow tendon repair.  6. Pilonidal cyst excision x2.  7. Sinus surgery.  8. Bilateral tubal ligation.   ALLERGIES:  CECLOR, SULFA cause rash.  She also had an increased cough with  ACE INHIBITORS.   MEDICATIONS:  1. Actos 30 mg daily (titrating up to a goal of 45 daily).  2. Metformin 500 mg p.o. q.i.d.  3. Atenolol 25 mg daily which the patient has discontinued on her own over     the past four days.  4. 70/30 Insulin 90 units b.i.d.  5. Lasix 80 mg daily.  6. Allopurinol 300 mg daily.  7. Singulair 10 mg daily.  8. Nexium.  9. Zyrtec 10 mg daily.  10.      Albuterol as needed.  11.      Rhinocort daily.  12.      Darvocet 100 mg p.r.n.   13.      Flexeril 5 mg p.r.n.  14.      Advair 250/50 one puff daily is being used on the patient.  15.      Qvar 80 mcg as needed.   REVIEW OF SYSTEMS:  The patient denies any lightheadedness, syncope,  hematuria, bright red blood per rectum.  She did receive a flu vaccine this  season. She has had a chronic cough for years which is especially aggravated  by talking and drinking liquids.  The patient did have colonoscopy in 1998  which showed two benign polyps. This was repeated in 2004 which showed one  benign polyp. She also had an EGD which confirms her hiatal hernia, but  otherwise normal.  On the Actos previously the patient had a fasting blood  sugar in the 80's to 90's which increased to CBG's in the 200's in the  evening.  While on it, she also gained 60 pounds and had increased lower  extremity edema.  Once the Actos was discontinued, her fastings increased to  around 200 in the evening, sugars around 400.  The swelling resolved, but  then returned after one week.  The patient reports that her blood sugars  yesterday included a fasting around 170 and a blood sugar around 225 in the  evening.   SOCIAL HISTORY:  The patient is divorced and lives with her 60 year old  mother and stepfather as well as her daughter and one grandson.  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  She is  awaiting disability and is currently receiving care through the health care  sharing initiative.  She does have a history of tobacco use, but quit in  1998 after 30  years of 1/2 pack a day.  No history of alcohol use or illicit  drug use ever.   FAMILY HISTORY:  Strong family history of diabetes including her mother who  was known to have renal problems and three siblings.  CAD in two brothers,  one of whom had a CABG x3 and another who required stenting.  She had a sister who has had three  strokes.  Her father died when she was age 61 from  a gunshot wound.  She has one aunt with asthma and several others on the  maternal side of the family with breast cancer.  Her mother also suffers  from obstructive sleep apnea.   PHYSICAL EXAMINATION:  VITAL SIGNS:  Temperature 98.4, blood pressure  140/91, heart rate 129, O2 saturation 89% on room air.  CBG 190, height 5  foot 3 inches, weight 167 pounds which leads to a BMI of 47.  GENERAL:  She is a morbidly obese, middle-aged white frequently coughing,  but in no acute distress.  Unable to speak in complete sentences.  HEENT:  PERRL.  EOMI.  Moist mucous membranes.  Poor dentition.  Oropharynx  without erythema or exudate.  No sinus tenderness to palpation.  NECK:  Thick and short with no appreciable lymphadenopathy.  Difficult to  examine thyroid.  HEART:  Distant heart sounds which are difficult to hear secondary to  habitus.  Radial pulses appeared to be 2+ and symmetrical and regular.  LUNGS:  Good air flow and effort.  Clear to auscultation bilaterally with no  wheezes, rhonchi, or rales.  ABDOMEN:  Obese, soft with normal bowel sounds.  Nontender and no  appreciable masses.  BACK:  No tenderness to palpation.  She does have thick excessive skin and a  hump-like quality to her upper back.  EXTREMITIES:  Trace to 1+ pitting edema bilateral lower extremities up to  about the mid chin and very thick around the ankles.  No lesions or ulcers.  No difference in circumference of either lower extremity.  NEUROLOGY:  Cranial nerves II-XII grossly intact.  No focal deficits.  Slow  ambulation and movement are somewhat slow secondary to habitus.   LABORATORY DATA:  December 11, 2003, at [facility], she had a white  count elevated at 14.1 with 57% granulocytes, 24% monocytes which was  elevated, and 20% lymphocytes.  Hemoglobin and hematocrit were low at 12.1  and 39.8, respectively with also a low MCV of 73 and elevated RDW of  22.  Platelets were 363.  Today at [facility], white count remained  elevated at 13.7 with continued elevation of monocytes at 18%.  49%  granulocytes, 33% lymphocytes.  H&H 11.8 and 38.4 respectively with  continued microcytic picture.  Platelets were also low at 84.   Two view chest x-ray at [facility] showed some vascular congestion,  cardiomegaly, and possibly a left pleural effusion.   ASSESSMENT:  A 55 year old morbidly obese white female with poorly  controlled diabetes type 2 requiring insulin, hypertension, LFA, anemia, and  presented with worsening dyspnea on exertion, continued cough, elevated  white count, and thrombocytopenia.   Problem 1.  Worsening dyspnea on exertion.  Differential includes CHF,  pneumonia, and PE.  Chest x-ray shows what appears to be pulmonary edema and  possibly left pleural effusion.  Will check decubitus films to evaluate this  possible effusion.  CHF is probably the most likely cause of her dyspnea, so we will check an EKG, cycle  cardiac enzymes, check a TSH, check fasting  lipids, order a two-dimensional echocardiogram and BNP, and change her to a  telemetry bed.  Follow her ins and outs and weights.  Consider a spiral CT  if we have heightened concern for PE, although I feel this is less likely as  she has no lower extremity pain or asymmetry.  She is, however, at risk  secondary to obesity and subsequent decreased ambulation.  There were no  signs of infiltrate on chest x-ray and the patient is afebrile.  With her  elevated white count and productive cough, we need to also be concerned  about a bronchitis or a pneumonia.  We will hold on antibiotics for now.  It  is also very likely that her obesity is contributing in a large part to her  underlying dyspnea.   Problem 2.  Type 2 diabetes under poor control.  We will discontinue the  Actos and continue the Metformin and 70/30 Insulin, placed on a diabetic  diet.  Consider  checking an A1C.   Problem 3.  Hypertension.  We restart Atenolol and follow her blood  pressures.  OSA is likely contributing to her elevated pressures.   Problem 4.  OSA.  The patient did not tolerate CPAP as an outpatient  secondary to claustrophobia.  Weight loss needs to be continually stress,  but unclear as to other options for this patient.   Problem 5.  Normocytic anemia.  Consider iron studies.  The patient had been  diagnosed with iron deficiency in the past, but stopped taking her iron  secondary to dosing regimen.  No history of any bleeding and she is  postmenopausal.  May also be sort of a chronic disease picture.   Problem 6.  FEN.  Normal saline, lock IV.  Check BMP to follow potassium  while on Lasix.  Diabetic diet.   Problem 7.  Asthma.  Continue Advair.  The patient was educated about  needing to use it one puff twice a day.  We will also continue Singulair and  albuterol.  Follow peak flows.  O2 as needed.   Problem 8.  Thrombocytopenia.  Will recheck a CBC now and follow her  platelets.   Problem 9.  Morbid obesity.  The patient really needs to work on diet and  exercise.  She has expressed interest in starting water aerobics.  We need  to encourage this or any sort of exercise.   DISPOSITION:  Await resolution of her hypoxia and __________ study.  Should  be able to return to her prior living arrangement.  Need to remember that  she utilizes health care sharing initiative.      Georgina Peer, M.D.                 Asencion Partridge, M.D.    JM/MEDQ  D:  12/18/2003  T:  12/19/2003  Job:  [id number]   cc:   [facility]

## 2011-03-04 NOTE — Op Note (Signed)
[facility]  Patient:    [name] Rice, [name] Rice                       MRN: [id number] Proc. Date: 07/21/00 Adm. Date:  [id number] Attending:  Marlowe Kays Page                           Operative Report  PREOPERATIVE DIAGNOSIS:  Herniated nucleus pulposus L5-S1 left.  POSTOPERATIVE DIAGNOSIS:  Herniated nucleus pulposus L5-S1 left.  OPERATION:  Microdiskectomy L5-S1 left with excision of herniated nucleus pulposus and decompression of S1 nerve root.  SURGEON:  Illene Labrador. Aplington, M.D.  ASSISTANT:  Javier Docker, M.D.  ANESTHESIA:  General.  PATHOLOGY AND JUSTIFICATION FOR PROCEDURE:  She has had a long history of progressive back and left leg pain and numbness.  MRI has demonstrated central disk herniation at L5-S1.  She has had no relief with epidural steroid injections.  She has minimal discomfort on the right side, and left side surgery only was performed.  In addition to the disk herniation, what appeared to be a significant cause of her symptoms was a substantial plexus of veins in the lateral recess, as discussed below.  DESCRIPTION OF PROCEDURE:  Antibiotic prophylaxis.  Satisfactory general anesthesia, knee-chest position on the [place] frame.  Back was prepped with DuraPrep and with three spinal needles and lateral x-ray, I tentatively located the L5-S1 interspace.  Then continued draping the back into a sterile field, Ioban employed, a vertical incision based on the initial x-ray.  This was carried down to what I thought was the spinous process of L5 and S1. These were tagged with Kocher clamps and a lateral x-ray taken confirming that this was the L5-S1 interspace with the disk space midway between the clamps. Then continued dissection of soft tissue off the lamina of L5 and the sacrum with anatomic confirmation of the level based on the sacrum.  With double action rongeur removed a small portion of inferior lamina of L5 and  then undermined the superior sacrum with a small curette.  This was followed with 2 mm Kerrison removing bone and ligamentum flavum and then using 2, 3, and 4 mm Kerrison rongeurs, removed bone and ligamentum flavum until we had sufficient working room to bring in the microscope where I completed the decompression, particularly laterally.  She had extensive veins, some of which were bleeding. The bleeding was controlled with bipolar cautery and the venous plexus obliterated with the same.  The S1 nerve root was identified and mobilized along with the dura medially.  The L5-S1 disk was identified.  Overlying it, was again extensive venous pattern which was cauterized with the bipolar cautery.  The disk was then opened with 15 knife blade, and all disk material obtainable, particularly in the central portion in keeping with MRI, was removed with a combination of pituitary rongeur, straight and angled up bite, and Epstein curette.  When we had removed all disk material obtainable from the interspace, we checked to be sure the foramen was widely patent around the S1 nerve root, and there were no longer any veins compromising the S1 nerve root.   The wound was dry on closure.  It was well irrigated with sterile saline, and Gelfoam was placed over the interspace around the dura and S1 nerve root.  I then removed the self-retaining McCullough retractors.  There was no unusual bleeding.  The fascia was closed with  interrupted #1 Vicryl, as well as the deep subcutaneous tissue, superficial and subcutaneous tissue was closed with 2-0 Vicryl, and infiltrated with 1/2% Marcaine with adrenalin, and the skin was closed with staples.  Betadine and Adaptic dry sterile dressing applied.  She tolerated the procedure well and was taken to the recovery room in satisfactory condition with no known complications. DD:  07/21/00 TD:  07/21/00 Job: [id number]

## 2011-03-04 NOTE — Discharge Summary (Signed)
[name] Rice, [name] Rice                ACCOUNT NO.:  [id number]   MEDICAL RECORD NO.:  [id number]          PATIENT TYPE:  INP   LOCATION:  2008                         FACILITY:  [facility]   PHYSICIAN:  Inis Sizer, M.D.     DATE OF BIRTH:  May 27, 1956   DATE OF ADMISSION:  05/20/2005  DATE OF DISCHARGE:  05/24/2005                                 DISCHARGE SUMMARY   CONSULTING PHYSICIAN:  None.   DISCHARGE DIAGNOSES:  1.  Chronic obstructive pulmonary disease.  2.  History of pneumonia.  3.  Diarrhea.  4.  Increased LFT's.  5.  Diabetes mellitus.  6.  Hypertension.  7.  Obstructive sleep apnea.  8.  Gastroesophageal reflux disease.  9.  Osteoarthritis.  10. History of hyperuricemia.  11. History of normocytic iron deficient anemia.   DISCHARGE MEDICATIONS:  1.  Atenolol 25 mg daily.  2.  Lasix 80 mg daily.  3.  Flexeril 5 mg daily.  4.  Aspirin 81 mg daily.  5.  Protonix 40 mg daily.  6.  Singular 10 mg daily.  7.  Allopurinol 300 mg daily.  8.  NPH insulin 30 units q.a.m. and 30 units q.h.s.  9.  KCl 20 mg twice a day.  10. Atrovent two puffs q.6h.  11. Advair 250/50 b.i.d.  12. Loratadine 10 mg daily.  13. Paxil 10 mg daily.  14. Albuterol 1-2 puffs q.6h. p.r.n. wheezing or shortness of breath.   DISPOSITION AND FOLLOWUP:  The patient is discharged to home and will  receive help with activities of daily living from a home health nurse.  The  patient was seen in the hospital by OT and PT and arrangement with home  health nurse has been accomplished.  The patient will be called by Dr.  Orma Flaming for arrangements of followup appointment in the outpatient clinic in  the next several weeks.   PROCEDURES PERFORMED:  Chest x-rays May 20, 2005 and May 24, 2005 are  notable for progressive improvement and clearing of the previously  demonstrated left upper lobe pneumonia.   HISTORY OF PRESENT ILLNESS:  The patient is a 55 year old Caucasian female  with a past medical history  significant for COPD on two liters of home O2,  diabetes mellitus and hypertension who was recently admitted on the internal  medicine service at [facility] from May 10, 2005 through May 15, 2005 for  treatment of a left upper lobe infiltrate consistent with community-acquired  pneumonia.  The patient completed a course of IV moxifloxacin in the  hospital and was discharged home to finish a 10 day course of moxifloxacin  p.o.  Several days after returning home the patient reported no difficulties  with activities of daily living, however, two days prior to admission the  patient reported increasing shortness of breath with ambulation to the point  that her ability to care for herself was limited.  The patient lives at home  with her two elderly parents who also were unable to fully help with her  care.  She returns to clinic for her followup appointment complaining of  generalized fatigue, weakness and shortness of breath with exertion.  She  denies any chest pain, fever or chills, syncope.  She also reports two  episodes of watery diarrhea in the last 24 hours before admission which is  new for her.  No blood or tarry stools were noted.   ADMISSION LABORATORY DATA:  Sodium 139, potassium 4.3, chloride 104,  bicarbonate 25, BUN 15, creatinine 1.0.  Blood sugar 140, white blood cell  count 10.4, hemoglobin 12, hematocrit 39, platelets 495.  Arterial blood gas  on room air pH 7.42, CO2 39, O2 71, bicarbonate 25, PT 14,  INR 1.1, D-dimer  0.3, bilirubin 0.5, alkaline phosphatase 162, AST 53, ALT 48, albumin 3.5,  calcium 9.3.   HOSPITAL COURSE:  During Ms. Diem's hospital stay she was evaluated for  possible myocardial infarction or pulmonary embolism as an explanation for  her increased shortness of breath at home.  Cardiac enzymes were negative x2  during admission and no changes consistent with acute ischemia or infarct  were noted on EKG or telemetry monitoring.  D-dimer was 0.3  suggestive of no  pulmonary embolus.  During the hospital stay the patient had good O2  saturations in the upper 90s on two liters of oxygen.  Furthermore her  admission ABG was consistent with a mild contraction alkalosis suggestive of  poor p.o. intake but stable respiratory function.  There was no evidence of  persistent or recurrent pneumonia.  In general, it was thought that the  patient's shortness of breath and weakness was secondary to her underlying  medical illnesses and recent admission for pneumonia with no evidence of an  acute or exacerbated process.  Problem 1. COPD.  The patient consistently saturated in the upper 90%  on  two liters during her hospital stay. To document requirement of home oxygen  the patient was given a trial off oxygen which was notable for an O2  saturation of 88% while lying supine on room air.  The patient responded  well to the respiratory therapy that was provided during her hospital stay  including her home regimens of Atrovent and albuterol.  In addition, the  patient was begun on Advair 250/50 b.i.d. and was discharged on this  regimen.  There was no evidence of worsened or progressive COPD during this  hospital admission.  Problem 2. History of pneumonia.  The patient had two chest x-rays performed  during this hospital stay which revealed improvement in the previously  observed left upper lobe infiltrate.  Furthermore the patient had completed  roughly 10 days of antibiotics including her IV moxifloxacin as an inpatient  followed by several days of p.o. moxifloxacin.  The patient was afebrile  with vital signs throughout her hospital admission and thus the moxifloxacin  was not continued and the patient was not discharged on any antibiotics.  Problem 3. Diarrhea. The patient reported two episodes of loose diarrhea on  the day prior to admission raising concern for possible infection.  Notably following admission the patient experienced no other  episodes of diarrhea  and had formed soft stools throughout her stay.  Stool cultures were  negative for any pathogens and C. difficile assay was negative x1.  Problem 4. Increase in LFT's.  Upon admission the patient was noted to have  a mild increase in her alkaline phosphatase, AST and ALT levels with a  normal bilirubin.  A possible etiology of this elevated LFT's was accessed.  A review of the patient's old clinic  records revealed a similar mild  elevation of LFT'S in June 2006 and also in April 2000.  Hepatitis B and C  serologies performed during this admission showed no evidence of acute or  past infection.  The mild increase in LFT's is thus thought to be long-  standing of unknown etiology and may be secondary to one of the medications  the patient is taking.  Problem 5. Diabetes.  During the patient's hospital stay she was treated  with sliding scale insulin.  The day prior to admission the patient was  started on 20 units NPH at night and 25 units in the morning.  During her  hospital stay the patient's sugars were consistently elevated in the 200-  300.  A review of the patient's clinic notes was remarkable for many  attempts to control her blood sugar unsuccessfully.  The patient reports a  history of being told she is very insulin-resistant.  Upon discharge on May 15, 2005 the patient was discharged on 40 units of NPH insulin in the  morning and 100 units of NPH insulin in the evening.  For this admission the  patient was initially discharged on the same regimen of 40 units NPH in the  morning and 100 units in the evening.  However, following discharge upon  further consultation with the team it was decided to decrease the dose of  NPH insulin to 30 units in the morning and 30 units in the evening.  The  patient was telephoned and notified by Dr. Orma Flaming of this change prior to  having taken any home doses of insulin.  The patient will keep a diary of  her blood sugars over  the next several days and upon return for followup the  patient's diabetic regimen will be reassessed.  Of note the patient was  taking Metformin prior to her May 10, 2005 hospital admission but during  that hospital stay subsequently the patient has been off Metformin.   DISCHARGE LABORATORY:  Sodium 136, potassium 4.2, chloride 106, CO2 25, BUN  7, creatinine 0.7, glucose 255, bilirubin 0.5, alkaline phosphatase 121, AST  38, ALT 41, protein 6.1, albumin 2.9, calcium 9.0.       EM/MEDQ  D:  05/25/2005  T:  05/26/2005  Job:  [id number]

## 2011-03-25 ENCOUNTER — Other Ambulatory Visit: Payer: Self-pay | Admitting: Internal Medicine

## 2011-03-25 ENCOUNTER — Other Ambulatory Visit (INDEPENDENT_AMBULATORY_CARE_PROVIDER_SITE_OTHER): Payer: Medicare Other | Admitting: Internal Medicine

## 2011-03-25 DIAGNOSIS — J45909 Unspecified asthma, uncomplicated: Secondary | ICD-10-CM

## 2011-03-28 ENCOUNTER — Other Ambulatory Visit (INDEPENDENT_AMBULATORY_CARE_PROVIDER_SITE_OTHER): Payer: Medicare Other | Admitting: Internal Medicine

## 2011-03-28 ENCOUNTER — Other Ambulatory Visit: Payer: Self-pay | Admitting: Internal Medicine

## 2011-03-28 DIAGNOSIS — IMO0002 Reserved for concepts with insufficient information to code with codable children: Secondary | ICD-10-CM

## 2011-03-30 ENCOUNTER — Encounter: Payer: Self-pay | Admitting: Internal Medicine

## 2011-03-31 NOTE — Telephone Encounter (Signed)
Called to pharm 

## 2011-03-31 NOTE — Telephone Encounter (Signed)
Called to pharmacy 

## 2011-04-04 ENCOUNTER — Ambulatory Visit (INDEPENDENT_AMBULATORY_CARE_PROVIDER_SITE_OTHER): Payer: Medicare Other | Admitting: Internal Medicine

## 2011-04-04 ENCOUNTER — Ambulatory Visit (INDEPENDENT_AMBULATORY_CARE_PROVIDER_SITE_OTHER): Payer: Medicare Other | Admitting: Dietician

## 2011-04-04 VITALS — BP 120/67 | HR 71 | Temp 98.7°F | Ht 64.0 in | Wt 262.9 lb

## 2011-04-04 DIAGNOSIS — F3289 Other specified depressive episodes: Secondary | ICD-10-CM

## 2011-04-04 DIAGNOSIS — F329 Major depressive disorder, single episode, unspecified: Secondary | ICD-10-CM

## 2011-04-04 DIAGNOSIS — R42 Dizziness and giddiness: Secondary | ICD-10-CM

## 2011-04-04 DIAGNOSIS — I10 Essential (primary) hypertension: Secondary | ICD-10-CM

## 2011-04-04 DIAGNOSIS — E119 Type 2 diabetes mellitus without complications: Secondary | ICD-10-CM

## 2011-04-04 DIAGNOSIS — E785 Hyperlipidemia, unspecified: Secondary | ICD-10-CM

## 2011-04-04 DIAGNOSIS — L219 Seborrheic dermatitis, unspecified: Secondary | ICD-10-CM

## 2011-04-04 DIAGNOSIS — Z23 Encounter for immunization: Secondary | ICD-10-CM

## 2011-04-04 DIAGNOSIS — M79609 Pain in unspecified limb: Secondary | ICD-10-CM

## 2011-04-04 DIAGNOSIS — M79646 Pain in unspecified finger(s): Secondary | ICD-10-CM

## 2011-04-04 LAB — COMPLETE METABOLIC PANEL WITH GFR
AST: 24 U/L (ref 0–37)
Albumin: 4.6 g/dL (ref 3.5–5.2)
Alkaline Phosphatase: 92 U/L (ref 39–117)
BUN: 24 mg/dL — ABNORMAL HIGH (ref 6–23)
GFR, Est Non African American: 58 mL/min — ABNORMAL LOW (ref >60)
Glucose, Bld: 150 mg/dL — ABNORMAL HIGH (ref 70–99)
Potassium: 4.3 mEq/L (ref 3.5–5.3)
Sodium: 139 mEq/L (ref 135–145)
Total Bilirubin: 0.5 mg/dL (ref 0.3–1.2)
Total Protein: 7.5 g/dL (ref 6.0–8.3)

## 2011-04-04 LAB — GLUCOSE, CAPILLARY: Glucose-Capillary: 129 mg/dL — ABNORMAL HIGH (ref 70–99)

## 2011-04-04 LAB — LIPID PANEL
HDL: 47 mg/dL (ref >39)
LDL Cholesterol: 99 mg/dL (ref 0–99)

## 2011-04-04 LAB — POCT GLYCOSYLATED HEMOGLOBIN (HGB A1C): Hemoglobin A1C: 7.9

## 2011-04-04 MED ORDER — INSULIN GLARGINE 100 UNIT/ML ~~LOC~~ SOLN: 60.0000 [IU] | Freq: Two times a day (BID) | SUBCUTANEOUS | Status: DC

## 2011-04-04 MED ORDER — SELENIUM SULFIDE 1 % EX LOTN: TOPICAL_LOTION | CUTANEOUS | Status: DC

## 2011-04-04 MED ORDER — PRAVASTATIN SODIUM 40 MG PO TABS: 40.0000 mg | ORAL_TABLET | Freq: Every day | ORAL | Status: DC

## 2011-04-04 MED ORDER — MECLIZINE HCL 25 MG PO TABS: 25.0000 mg | ORAL_TABLET | Freq: Three times a day (TID) | ORAL | Status: DC | PRN

## 2011-04-04 NOTE — Patient Instructions (Signed)
Please schedule a follow up appointment in 1 mont or earlier if needed. Please take your medicines as prescribed. Please schedule a follow up appointment with Jamison Neighbor in 2 weeks.

## 2011-04-04 NOTE — Patient Instructions (Signed)
You are doing a great  Job taking care of you!  See med list and sheet provided for correction during daytime and bedtime.  Add daytime correction ONLY before meals and do not correct more than 4-5 times in one day.   [name] Rice [phone]

## 2011-04-04 NOTE — Progress Notes (Signed)
  Subjective:    Patient ID: [name] Rice, female    DOB: 1956/08/03, 55 y.o.   MRN: [id number]  HPI: 55 year old woman with past medical history significant for diabetes depression, COPD comes to the clinic for followup visit.  She fell down a week ago injuring her right ring finger and bruising her knees. She was complaining of some pain in her right ring finger but has been using splint and pain medications that has been helping her. She also reports that her blood sugars were running high recently and she increased her Lantus from 70 units once in the morning to 70 units twice daily.  She also reports itching over her face and scalp and requested a refill for selenium. Denies any fever nausea, vomiting, chest pain, palpitations, abdominal pain, bladder or bowel complaints.    Review of Systems  Constitutional: Negative for fever, activity change, appetite change and fatigue.  HENT: Negative for congestion, rhinorrhea, sneezing and postnasal drip.   Respiratory: Negative for cough, shortness of breath and wheezing.   Cardiovascular: Negative for chest pain, palpitations and leg swelling.  Genitourinary: Negative for dysuria and difficulty urinating.  Musculoskeletal: Negative for arthralgias.  Neurological: Negative for weakness and headaches.  Hematological: Negative for adenopathy.       Objective:   Physical Exam  Constitutional: She is oriented to person, place, and time. She appears well-developed and well-nourished.       Morbidly obese  HENT:  Head: Normocephalic and atraumatic.  Right Ear: External ear normal.  Left Ear: External ear normal.  Mouth/Throat: Oropharynx is clear and moist.  Eyes: Conjunctivae and EOM are normal. Pupils are equal, round, and reactive to light.  Neck: Normal range of motion. Neck supple.  Cardiovascular: Normal rate, regular rhythm, normal heart sounds and intact distal pulses.  Exam reveals no gallop and no friction rub.   No murmur  heard. Pulmonary/Chest: Effort normal and breath sounds normal. No respiratory distress. She has no wheezes. She has no rales. She exhibits no tenderness.  Abdominal: Soft. Bowel sounds are normal. She exhibits no distension and no mass. There is no tenderness. There is no rebound and no guarding.  Musculoskeletal: Normal range of motion.  Neurological: She is alert and oriented to person, place, and time. She has normal reflexes. No cranial nerve deficit. Coordination normal.  Skin: Skin is warm.          Assessment & Plan:

## 2011-04-04 NOTE — Progress Notes (Signed)
Medical Nutrition Therapy:  Appt start time: 1600 end time:  1630.  Assessment:  Primary concerns today: Weight management and Blood sugar control Usual eating pattern includes Meal 3 and 1+ snacks per day.  Reports that she lost 20 # by weighing daily and reocrding blood sugars. Gained some back with switch to daytime schedule. She is able to state most foods that contain carbs and  that meal time insulin is dosed based on how much she eats, but not that it is mostly carbs that affect blood sugar. Reads labels, but has not quantified mealtime amount fo carbs lately. Eats heavy first few weeks of month after getting check,gets food from several food pantries. also gets 16.00 of food stamps     Usual physical activity includes ADLs only, currently working on trying to stay awake during daytime  Diagnosis and Intervention:  Progress Towards Goal(s):  In progress   Nutritional Diagnosis:  [place]-2.2 Altered nutrition-related laboratory As related to blood sugars higher than goal due ti increase in eating with daytime schedule.  As evidenced by a1c of 7.9%.   Interventions: 1- Discussed care with physician: add correction and discuss adjustment to  lantus to permit desired weight loss. 2- Educated patient how to correct her blood sugars before meals and middle of night safely 3- Reviewed carb coutning briefly and suggested patient keep track of quantity of carbs eaten at meals.  4- Social support provided 5- Emphasized prevention of hypoglycemia by using insulin safely (patient with 3 falls this year)   Monitoring/Evaluation:  Dietary intake in 4 week(s)

## 2011-04-05 LAB — MICROALBUMIN / CREATININE URINE RATIO
Creatinine, Urine: 36.7 mg/dL
Microalb Creat Ratio: 15.5 mg/g (ref 0.0–30.0)
Microalb, Ur: 0.57 mg/dL (ref 0.00–1.89)

## 2011-04-05 MED ORDER — INSULIN ASPART 100 UNIT/ML ~~LOC~~ SOLN: SUBCUTANEOUS | Status: DC

## 2011-04-06 ENCOUNTER — Encounter: Payer: Self-pay | Admitting: Internal Medicine

## 2011-04-06 DIAGNOSIS — L219 Seborrheic dermatitis, unspecified: Secondary | ICD-10-CM | POA: Insufficient documentation

## 2011-04-06 DIAGNOSIS — M79646 Pain in unspecified finger(s): Secondary | ICD-10-CM | POA: Insufficient documentation

## 2011-04-06 NOTE — Assessment & Plan Note (Signed)
She denies any depressed mood/suicidal thoughts/ideation. Will continue on current dose of cymbalta.

## 2011-04-06 NOTE — Assessment & Plan Note (Signed)
AIc  -7.9 today. Her CBG's were reviewed- she has some values in high 300's. She increased herself her Lantus from 70 units daily to 70 units twice daily. Will change her insulin to 60 units bid.  Will refer her to Jamison Neighbor- As per her recs- she was added meal coverage to current sliding scale regimen.   Lab Results  Component Value Date   HGBA1C 7.9 04/04/2011   HGBA1C 7.2 09/07/2010   CREATININE 0.99 04/04/2011   CREATININE 1.01 02/17/2011   MICROALBUR 0.57 04/04/2011   MICRALBCREAT 15.5 04/04/2011   CHOL 180 04/04/2011   HDL 47 04/04/2011   TRIG 169* 04/04/2011

## 2011-04-06 NOTE — Assessment & Plan Note (Signed)
She complains of some itching on her face and scalp. On exam she had some flaky,itchy lesions that appears to be seborrheic dermatitis. Will treat her with selenium sulphide topical lotion.

## 2011-04-06 NOTE — Assessment & Plan Note (Addendum)
Her BP is stable. Will continue current regimen. Will get a BMET today.   Lab Results  Component Value Date   NA 139 04/04/2011   K 4.3 04/04/2011   CL 101 04/04/2011   CO2 26 04/04/2011   BUN 24* 04/04/2011   CREATININE 0.99 04/04/2011   CREATININE 1.01 02/17/2011    BP Readings from Last 3 Encounters:  04/04/11 120/67  02/18/11 110/60  01/28/11 125/60

## 2011-04-06 NOTE — Assessment & Plan Note (Signed)
Will get a lipid panel. Will continue pravastatin at current dose for now.

## 2011-04-06 NOTE — Assessment & Plan Note (Signed)
She complains of some right finger pain from a fall that she had a week ago. She was using a splint and PRN pain meds were helping her. On exam there was no swelling, erythema, restricted ROM. Do not think it was a fracture. She was advised to continue using the splint.

## 2011-04-06 NOTE — Assessment & Plan Note (Signed)
Her last episode was few weeks ago. She was advised to take meclizine on as needed basis.

## 2011-04-27 ENCOUNTER — Other Ambulatory Visit: Payer: Self-pay | Admitting: Internal Medicine

## 2011-04-27 DIAGNOSIS — F32A Depression, unspecified: Secondary | ICD-10-CM

## 2011-04-27 DIAGNOSIS — M549 Dorsalgia, unspecified: Secondary | ICD-10-CM

## 2011-04-27 DIAGNOSIS — F329 Major depressive disorder, single episode, unspecified: Secondary | ICD-10-CM

## 2011-04-28 ENCOUNTER — Other Ambulatory Visit: Payer: Self-pay | Admitting: Internal Medicine

## 2011-04-28 NOTE — Telephone Encounter (Signed)
Called all 3 to pharm

## 2011-04-28 NOTE — Telephone Encounter (Signed)
Will give Zolpidem prescription to the clinic nurse.

## 2011-04-29 NOTE — Telephone Encounter (Signed)
Called to pharm 

## 2011-05-09 ENCOUNTER — Ambulatory Visit (INDEPENDENT_AMBULATORY_CARE_PROVIDER_SITE_OTHER): Payer: Medicare Other | Admitting: Internal Medicine

## 2011-05-09 VITALS — BP 141/73 | HR 79 | Temp 98.2°F | Ht 64.0 in | Wt 267.1 lb

## 2011-05-09 DIAGNOSIS — I10 Essential (primary) hypertension: Secondary | ICD-10-CM

## 2011-05-09 DIAGNOSIS — E119 Type 2 diabetes mellitus without complications: Secondary | ICD-10-CM

## 2011-05-09 DIAGNOSIS — R42 Dizziness and giddiness: Secondary | ICD-10-CM

## 2011-05-09 LAB — GLUCOSE, CAPILLARY: Glucose-Capillary: 143 mg/dL — ABNORMAL HIGH (ref 70–99)

## 2011-05-09 MED ORDER — MECLIZINE HCL 25 MG PO TABS: 25.0000 mg | ORAL_TABLET | Freq: Three times a day (TID) | ORAL | Status: DC | PRN

## 2011-05-09 NOTE — Patient Instructions (Signed)
Please take your medicines as prescribed. Please follow up with your appointment with ENT for dizziness.

## 2011-05-09 NOTE — Progress Notes (Signed)
  Subjective:    Patient ID: [name] Rice, female    DOB: 09/15/1956, 55 y.o.   MRN: [id number]  HPI: 55 year old woman with past medical history significant for type 2 diabetes mellitus, depression, hypertension comes to the clinic for a followup visit. She complains of noticing some bloody discharge from her ear while she was trying to clean her ear is with a Q-tip, this morning. She states that she experiences intense itching in ears and clean them every day at times with a sharp cleaner. She also reports dizziness which gets especially worse with  head movements.  Patient also complained of some shoulder pain which she thinks has been getting worse after she got a shot from last clinic visit. She states that she has been sleeping a lot nowadays because of her shoulder pain.  She states her blood sugars were running low in the past 2-3 weeks and she readjusted her insulin regimen- instead of taking 60 units bid, she reduced to 50 units of lantus bid.    Review of Systems  Constitutional: Positive for fatigue. Negative for fever, activity change and appetite change.  HENT: Positive for ear pain. Negative for nosebleeds, congestion, rhinorrhea, sneezing, neck pain and postnasal drip.   Respiratory: Negative for apnea, cough, choking, chest tightness, shortness of breath, wheezing and stridor.   Cardiovascular: Negative for chest pain, palpitations and leg swelling.  Gastrointestinal: Negative for abdominal distention.  Genitourinary: Negative for dysuria, urgency and frequency.  Musculoskeletal: Negative for arthralgias.  Neurological: Negative for dizziness, seizures and light-headedness.  Hematological: Negative for adenopathy.       Objective:   Physical Exam  Constitutional: She appears well-developed and well-nourished. No distress.  HENT:  Head: Normocephalic and atraumatic.       Right externa ear was noted having some dry flakes, TM was intact , no bloody discharge. Left  external ear was noticed having some dried blood, but TM was intact  Eyes: Conjunctivae and EOM are normal. Pupils are equal, round, and reactive to light. Right eye exhibits no discharge. Left eye exhibits no discharge.  Neck: Normal range of motion. Neck supple. No JVD present. No tracheal deviation present. No thyromegaly present.  Cardiovascular: Normal rate, regular rhythm, normal heart sounds and intact distal pulses.  Exam reveals no gallop and no friction rub.   No murmur heard. Pulmonary/Chest: Effort normal and breath sounds normal. No stridor. No respiratory distress. She has no wheezes. She has no rales. She exhibits no tenderness.  Abdominal: Soft. Bowel sounds are normal. She exhibits no distension and no mass. There is no tenderness. There is no rebound and no guarding.  Musculoskeletal: She exhibits no edema and no tenderness.       Restricted ROM ( adduction and internal rotation) with right shoulder, no obvious swelling or erythema was present  Lymphadenopathy:    She has no cervical adenopathy.  Neurological: She is alert. She has normal reflexes. She displays normal reflexes. No cranial nerve deficit. She exhibits normal muscle tone. Coordination normal.  Skin: Skin is warm. She is not diaphoretic.          Assessment & Plan:

## 2011-05-10 NOTE — Assessment & Plan Note (Signed)
Blood pressure slightly above goal. Will continue to monitor with subsequent visits.  Lab Results  Component Value Date   NA 139 04/04/2011   K 4.3 04/04/2011   CL 101 04/04/2011   CO2 26 04/04/2011   BUN 24* 04/04/2011   CREATININE 0.99 04/04/2011   CREATININE 1.01 02/17/2011    BP Readings from Last 3 Encounters:  05/09/11 141/73  04/04/11 120/67  02/18/11 110/60

## 2011-05-10 NOTE — Assessment & Plan Note (Signed)
She continues to complain episodes of dizziness. She describes 'as whenever she stands up she falls backwards'. She clearly states that her dizziness gets worse with movement of head. Differentials include BPPV versus Menier's disease versus viral labyrinthitis versus cerebellar stroke. This is most likely BP PV as she states that her dizziness gets worse with changes in position of head. Viral labyrinthitis is less likely based on the duration of symptoms and cerebellar stroke  least likely in the absence of any other neurological signs. Will refer her to ENT  as vestibular rehabilitation could help with her symptoms. She was given a prescription for meclizine fro symptomatic management.  On my exam today, I also noticed some dried blood in her right external ear which is likely related to trauma while she was cleaning her ears. She was advised not to use sharp objects to clean her ears.

## 2011-05-10 NOTE — Assessment & Plan Note (Signed)
Patient seems to have fairly good understanding of how to adjust her insulin based on her CBGs. She did not get her glucose meter today. She was advised to bring one with the next visit. Will not make any changes in her regimen with today's visit.

## 2011-05-11 ENCOUNTER — Encounter: Payer: Self-pay | Admitting: Internal Medicine

## 2011-05-13 ENCOUNTER — Other Ambulatory Visit: Payer: Self-pay | Admitting: Neurology

## 2011-05-13 ENCOUNTER — Ambulatory Visit
Admission: RE | Admit: 2011-05-13 | Discharge: 2011-05-13 | Disposition: A | Discharge status: Home / Self Care | Payer: Medicare Other | Source: Ambulatory Visit | Attending: Neurology | Admitting: Neurology

## 2011-05-13 DIAGNOSIS — M25519 Pain in unspecified shoulder: Secondary | ICD-10-CM

## 2011-05-17 ENCOUNTER — Telehealth: Payer: Self-pay | Admitting: Dietician

## 2011-05-17 NOTE — Telephone Encounter (Signed)
Takes 60-120 units of lantus, 1/2 box cookies, ate breakfast and lunch and forgot insulin,  Blood sugar took 70 to counteract blood sugar.

## 2011-05-17 NOTE — Telephone Encounter (Signed)
Called to follow up with patient regarding how she is using correction insulin. She reports using the correction and it working well during food binges that have run her blood sugar up to 300 and 500, but is not covering carb intake before/during binge eating.  Discussed using carb counting to assist patient in preventing hyperglycemia. Patient scheduled na appointment for Thursday, 05/19/11.

## 2011-05-17 NOTE — Progress Notes (Deleted)
  Subjective:    Patient ID: [name] Rice, female    DOB: 1955-10-27, 55 y.o.   MRN: [id number]  HPI    Review of Systems     Objective:   Physical Exam        Assessment & Plan:   No problem-specific assessment & plan notes found for this encounter.

## 2011-05-17 NOTE — Progress Notes (Signed)
I ahave reviewed the encounter. Me and Lupita Leash discussed the changes made in the insulin regimen with the patient.

## 2011-05-19 ENCOUNTER — Ambulatory Visit (INDEPENDENT_AMBULATORY_CARE_PROVIDER_SITE_OTHER): Payer: Medicare Other | Admitting: Dietician

## 2011-05-19 DIAGNOSIS — E119 Type 2 diabetes mellitus without complications: Secondary | ICD-10-CM

## 2011-05-19 NOTE — Progress Notes (Signed)
Medical Nutrition Therapy: Appt start time: 1315 end time: 1415.  Assessment: Primary concerns today: Weight management and Blood sugar control  Usual eating pattern includes Meal 3 and 1+ snacks per day. Reports that she has been eating more due to pain and staying awake more of the day time. She is still struggling with food choices due to budget constraints. Verbalized good understanding of carb counting by label reading. Has been recording carbs in meter. Meter download shows improvement in using correction and that 40 units is not covering 60 grams carb. Patient mentions frequent and moderate hunger shortly after meals often Usual physical activity includes ADLs only, currently working on trying to stay awake during daytime  Diagnosis and Intervention:  Progress Towards Goal(s): In progress  Nutritional Diagnosis:  [place]-2.2 Altered nutrition-related laboratory somewhat improved As evidenced by meter download.  Interventions:  1- Discussed timing of insulin to 20 minutes before meals for better first phase coverage.  2- Suggest:increase meal coverage to 1 unit for each 1 gram carb or 55-60 units novlog for meals.  2- Educated patient how to correct her blood sugars before meals and middle of night safely  3-  Social support provided  4- could consider incretin mimetic for weigh loss and prandial coverage.  Monitoring/Evaluation: Dietary intake in 2 months

## 2011-05-19 NOTE — Patient Instructions (Addendum)
You are doing a great job counting carbs and keeping records of insulin, carbs and blood sugars! Very helpful information.  From reviewing your meter download information, it appears you need about 1 unit for each gram  of carb at most times of day. 40 units for 40 grams carb, 60 units for 60 grams carb, etc...   Covering carbs when you eat them better should help you lower your after meal blood sugars (and your A1C)  and ultimately need less correction.  You are doing a great job with your correction insulin: remember you can always cover carbs any time, but can only use correction insulin every 4 hours.

## 2011-05-30 ENCOUNTER — Other Ambulatory Visit: Payer: Self-pay | Admitting: Internal Medicine

## 2011-06-30 ENCOUNTER — Other Ambulatory Visit: Payer: Self-pay | Admitting: Internal Medicine

## 2011-07-01 ENCOUNTER — Telehealth: Payer: Self-pay | Admitting: Dietician

## 2011-07-01 NOTE — Telephone Encounter (Signed)
Wonders if walker would help prevent falls. Starts rehab this at [facility] neurology this Thursday.  do they recommend or do we order?

## 2011-07-01 NOTE — Telephone Encounter (Signed)
Patient called back because she Also needs refills on both insulins. Is using more insulin now with correction and blood sugars are much better. Is now using: lantus using 100 units once daily Novolog using 50 units  at 2-3 meals and correction up to another 50 units per day

## 2011-07-04 NOTE — Telephone Encounter (Signed)
I am not sure either. I tried calling the patient but she is not reachable. I think rehab should order one if she needs it  but in case they haven't  I would order when she follows up with me in the clinic.

## 2011-07-07 ENCOUNTER — Ambulatory Visit: Payer: Medicare Other | Attending: Otolaryngology | Admitting: Physical Therapy

## 2011-07-07 DIAGNOSIS — R42 Dizziness and giddiness: Secondary | ICD-10-CM | POA: Insufficient documentation

## 2011-07-07 DIAGNOSIS — R269 Unspecified abnormalities of gait and mobility: Secondary | ICD-10-CM | POA: Insufficient documentation

## 2011-07-07 DIAGNOSIS — IMO0001 Reserved for inherently not codable concepts without codable children: Secondary | ICD-10-CM | POA: Insufficient documentation

## 2011-07-09 ENCOUNTER — Other Ambulatory Visit: Payer: Self-pay | Admitting: Internal Medicine

## 2011-07-11 NOTE — Telephone Encounter (Signed)
Called to pharm, pt has appt 10/22 w/ dr Dorthula Rue

## 2011-07-11 NOTE — Telephone Encounter (Signed)
Called to pharm, pt has appt w/ dr Dorthula Rue 10/22

## 2011-07-11 NOTE — Telephone Encounter (Signed)
I have tried calling several times on her house phone number but she is not responding. She needs to come and sign the pain contract if she needs refills on her narcotics. I will not refill her narcs like that.

## 2011-07-13 ENCOUNTER — Other Ambulatory Visit: Payer: Self-pay | Admitting: Internal Medicine

## 2011-07-15 ENCOUNTER — Other Ambulatory Visit: Payer: Self-pay | Admitting: Internal Medicine

## 2011-07-15 DIAGNOSIS — M549 Dorsalgia, unspecified: Secondary | ICD-10-CM

## 2011-07-15 NOTE — Telephone Encounter (Signed)
Has been called to pt's pharmacy.

## 2011-07-15 NOTE — Telephone Encounter (Signed)
Refill called to [facility] 

## 2011-07-18 ENCOUNTER — Telehealth: Payer: Self-pay | Admitting: *Deleted

## 2011-07-18 ENCOUNTER — Encounter: Payer: Medicare Other | Admitting: Physical Therapy

## 2011-07-18 ENCOUNTER — Other Ambulatory Visit (HOSPITAL_COMMUNITY): Payer: Self-pay | Admitting: Specialist

## 2011-07-18 DIAGNOSIS — M25511 Pain in right shoulder: Secondary | ICD-10-CM

## 2011-07-18 NOTE — Telephone Encounter (Signed)
I already did refill that.

## 2011-07-18 NOTE — Telephone Encounter (Signed)
Pt calls and states she cannot get her [place] or lyrica at [facility], called pharm, pharm states they don't have newest refills, they are both given to him and pt is informed that she may pick up

## 2011-07-20 LAB — GLUCOSE, CAPILLARY: Glucose-Capillary: 121 — ABNORMAL HIGH

## 2011-07-22 LAB — GLUCOSE, CAPILLARY: Glucose-Capillary: 87 mg/dL (ref 70–99)

## 2011-07-26 ENCOUNTER — Ambulatory Visit (HOSPITAL_COMMUNITY)
Admission: RE | Admit: 2011-07-26 | Discharge: 2011-07-26 | Disposition: A | Discharge status: Home / Self Care | Payer: Medicare Other | Source: Ambulatory Visit | Attending: Specialist | Admitting: Specialist

## 2011-07-26 DIAGNOSIS — M67919 Unspecified disorder of synovium and tendon, unspecified shoulder: Secondary | ICD-10-CM | POA: Insufficient documentation

## 2011-07-26 DIAGNOSIS — S43429A Sprain of unspecified rotator cuff capsule, initial encounter: Secondary | ICD-10-CM | POA: Insufficient documentation

## 2011-07-26 DIAGNOSIS — M719 Bursopathy, unspecified: Secondary | ICD-10-CM | POA: Insufficient documentation

## 2011-07-26 DIAGNOSIS — M19019 Primary osteoarthritis, unspecified shoulder: Secondary | ICD-10-CM | POA: Insufficient documentation

## 2011-07-26 DIAGNOSIS — X58XXXA Exposure to other specified factors, initial encounter: Secondary | ICD-10-CM | POA: Insufficient documentation

## 2011-07-26 DIAGNOSIS — M25511 Pain in right shoulder: Secondary | ICD-10-CM

## 2011-07-27 ENCOUNTER — Encounter: Payer: Medicare Other | Admitting: Physical Therapy

## 2011-08-02 ENCOUNTER — Other Ambulatory Visit: Payer: Self-pay | Admitting: Internal Medicine

## 2011-08-05 ENCOUNTER — Other Ambulatory Visit: Payer: Self-pay | Admitting: Internal Medicine

## 2011-08-08 ENCOUNTER — Encounter: Payer: Self-pay | Admitting: Internal Medicine

## 2011-08-08 ENCOUNTER — Ambulatory Visit (INDEPENDENT_AMBULATORY_CARE_PROVIDER_SITE_OTHER): Payer: Medicare Other | Admitting: Internal Medicine

## 2011-08-08 ENCOUNTER — Ambulatory Visit (INDEPENDENT_AMBULATORY_CARE_PROVIDER_SITE_OTHER): Payer: Medicare Other | Admitting: Dietician

## 2011-08-08 VITALS — BP 131/75 | HR 75 | Temp 98.0°F | Resp 20 | Ht 63.0 in | Wt 275.9 lb

## 2011-08-08 DIAGNOSIS — Z23 Encounter for immunization: Secondary | ICD-10-CM

## 2011-08-08 DIAGNOSIS — M25519 Pain in unspecified shoulder: Secondary | ICD-10-CM

## 2011-08-08 DIAGNOSIS — E119 Type 2 diabetes mellitus without complications: Secondary | ICD-10-CM

## 2011-08-08 DIAGNOSIS — R42 Dizziness and giddiness: Secondary | ICD-10-CM

## 2011-08-08 DIAGNOSIS — IMO0002 Reserved for concepts with insufficient information to code with codable children: Secondary | ICD-10-CM

## 2011-08-08 DIAGNOSIS — M25511 Pain in right shoulder: Secondary | ICD-10-CM

## 2011-08-08 DIAGNOSIS — I10 Essential (primary) hypertension: Secondary | ICD-10-CM

## 2011-08-08 DIAGNOSIS — Z0000 Encounter for general adult medical examination without abnormal findings: Secondary | ICD-10-CM

## 2011-08-08 LAB — CBC WITH DIFFERENTIAL/PLATELET
Basophils Absolute: 0 10*3/uL (ref 0.0–0.1)
Basophils Relative: 0 % (ref 0–1)
Eosinophils Absolute: 0.4 10*3/uL (ref 0.0–0.7)
Eosinophils Relative: 3 % (ref 0–5)
HCT: 40.4 % (ref 36.0–46.0)
Lymphocytes Relative: 19 % (ref 12–46)
MCH: 24.9 pg — ABNORMAL LOW (ref 26.0–34.0)
MCHC: 30 g/dL (ref 30.0–36.0)
MCV: 83.1 fL (ref 78.0–100.0)
Monocytes Absolute: 0.7 10*3/uL (ref 0.1–1.0)
RDW: 15.8 % — ABNORMAL HIGH (ref 11.5–15.5)

## 2011-08-08 LAB — BASIC METABOLIC PANEL WITH GFR
BUN: 21 mg/dL (ref 6–23)
CO2: 25 mEq/L (ref 19–32)
Chloride: 98 mEq/L (ref 96–112)
Creat: 0.92 mg/dL (ref 0.50–1.10)
Glucose, Bld: 165 mg/dL — ABNORMAL HIGH (ref 70–99)

## 2011-08-08 NOTE — Progress Notes (Signed)
Medical Nutrition Therapy:  Appt start time: 1430 end time:  1515.  Assessment:  Primary concerns today: Blood sugar control Usual eating pattern includes Meal 2-3nd 0-1 snacks per day.    Avoided foods include: white flour & sugar. About 80 dollars per month in grocery money and 16 in food-stamps. Goes to food pantries weekly and this week a monthly food pantry. Buys meats, bread cereal  and milk at grocery store. No fruit or vegetables. Is timing insulin 20 minutes before meals, has diarrhea she thinks form insulin, but more likely form metformin on an empty stomach as discussed today. Had two low blood sugars in Pm when taking 50 units of insulin before meal at bedtime so switched all mealtime insulin injections to 40 units them back to 45 units plus the correction insulin.      Usual physical activity includes very little, having trouble with pain control  Progress Towards Goal(s):  Some progress    Nutritional Diagnosis:  [place]-2.2 Altered nutrition-related laboratory improved As evidenced by A1C drop form 7.9% to 7.4% but patient goal is less than 7 %.   Interventions:  1- Education and counseling about healthier foods on a budget. Encouraged patient to make meal plan and shopping list.  2- Suggest continue using 45 units at Pm meal that is before bed, consider 50 at daytime meal.  3- Think  incretin mimetic may assist with weight and prandial coverage.  Monitoring/Evaluation: Dietary intake and meter download in 2 months

## 2011-08-08 NOTE — Patient Instructions (Signed)
Please schedule a follow up appointment in 3 months or earlier if needed. Please bring your medication bottles with your next visit.

## 2011-08-09 LAB — DRUGS OF ABUSE SCREEN W/O ALC, ROUTINE URINE
Barbiturate Quant, Ur: NEGATIVE
Cocaine Metabolites: NEGATIVE
Creatinine,U: 16.3 mg/dL
Opiate Screen, Urine: NEGATIVE

## 2011-08-10 ENCOUNTER — Other Ambulatory Visit: Payer: Self-pay | Admitting: *Deleted

## 2011-08-10 DIAGNOSIS — E785 Hyperlipidemia, unspecified: Secondary | ICD-10-CM

## 2011-08-10 NOTE — Telephone Encounter (Signed)
Pt called states she had a 30 % tear in rotator cuff on rt side.  Doctor feels it's healing some and he gave her cortisone injection and they want to see if it will heal on it's own.  Also she needs another Rx for " neuro rehab" she goes to [facility] on [address].( we need to fax)

## 2011-08-10 NOTE — Telephone Encounter (Signed)
Pt called to let you know she was seen by orthopedic MD yesterday I called back to see what he said and pt not at home.  Will call again

## 2011-08-12 MED ORDER — PRAVASTATIN SODIUM 40 MG PO TABS: 40.0000 mg | ORAL_TABLET | Freq: Every day | ORAL | Status: DC

## 2011-08-12 MED ORDER — GLUCOSE BLOOD VI STRP: ORAL_STRIP | Status: DC

## 2011-08-16 ENCOUNTER — Encounter: Payer: Self-pay | Admitting: Internal Medicine

## 2011-08-16 DIAGNOSIS — M25511 Pain in right shoulder: Secondary | ICD-10-CM | POA: Insufficient documentation

## 2011-08-16 DIAGNOSIS — Z0000 Encounter for general adult medical examination without abnormal findings: Secondary | ICD-10-CM | POA: Insufficient documentation

## 2011-08-16 NOTE — Assessment & Plan Note (Signed)
She has completed her diabetic card. Congratulated her for achievement! Lab Results  Component Value Date   HGBA1C 7.4 08/08/2011   HGBA1C 7.2 09/07/2010   CREATININE 0.92 08/08/2011   CREATININE 1.01 02/17/2011   MICROALBUR 0.57 04/04/2011   MICRALBCREAT 15.5 04/04/2011   CHOL 180 04/04/2011   HDL 47 04/04/2011   TRIG 169* 04/04/2011     Assessment: Diabetes control: controlled Progress toward goals: at goal Barriers to meeting goals: no barriers identified  Plan: Diabetes treatment: continue current medications Refer to: diabetes educator for self-management training and diabetes educator for medical nutrition therapy. She was seen by Gavin Pound with today's visit. Instruction/counseling given: reminded to get eye exam, reminded to bring blood glucose meter & log to each visit, reminded to bring medications to each visit, discussed foot care, discussed the need for weight loss and discussed diet

## 2011-08-16 NOTE — Assessment & Plan Note (Signed)
Lab Results  Component Value Date   NA 140 08/08/2011   K 3.8 08/08/2011   CL 98 08/08/2011   CO2 25 08/08/2011   BUN 21 08/08/2011   CREATININE 0.92 08/08/2011   CREATININE 1.01 02/17/2011    BP Readings from Last 3 Encounters:  08/08/11 131/75  05/09/11 141/73  04/04/11 120/67    Assessment: Hypertension control:  controlled  Progress toward goals:  at goal Barriers to meeting goals:  no barriers identified  Plan: Hypertension treatment:  continue current medications

## 2011-08-16 NOTE — Assessment & Plan Note (Signed)
She reports right shoulder pain for last 2 months since her last fall( 08/12) - MRI at the orthopedic office showed partial thickness tear. She has been getting Vicodin for her back pain from our clinic in the past. She was asked on how many pills has she been taking  in a day and she states about one pill at night. She did bring her pill bottle  which was half filled( last prescription from 07/15/11 for 60 tablets). Now she states that she needs these Vicodin for her shoulder pain. Given the fact that,  she is now requiring just one pill per night- we made her sign a new pain contract in the clinic with  the frequency of 30 pills per month. - She was asked to give a sample for UDS.

## 2011-08-16 NOTE — Assessment & Plan Note (Signed)
She reports that her episodes/symptoms of vertigo has significantly improved after she was taught some exercises at the vestibular rehabilitation. She requested another referral as she was just able to go once due to some other appointments. - Will refer her to vestibular rehabilatation.

## 2011-08-16 NOTE — Progress Notes (Signed)
  Subjective:    Patient ID: [name] Rice, female    DOB: 05-22-1956, 55 y.o.   MRN: [id number]  HPI: 55 year old woman with past history significant for type 2 diabetes mellitus, hypertension, hyperlipidemia, BPPV comes to the clinic for a followup visit.  She reports that her right shoulder is hurting and she has an appointment with her orthopedic tomorrow. She states that she has received corticosteroid shots in the past which has helped her.  She states that her dizziness/vertigo is getting better- she just had 4 episodes over a period of 1- 2 months as opposed to almost a daily event before she went to the vestibular rehabilitation. She states that the vestibular exercises are really helping her but she needs another referral as she was just able to go once because of some other appointments.  She states that her blood sugars are running in 150's to 200's- denies any hypoglycemic episodes.    Review of Systems  Constitutional: Negative for fever, chills, appetite change and fatigue.  HENT: Negative for congestion, rhinorrhea, sneezing and postnasal drip.   Eyes: Negative for photophobia and visual disturbance.  Respiratory: Negative for apnea, cough, choking, shortness of breath and wheezing.   Cardiovascular: Negative for chest pain, palpitations and leg swelling.  Gastrointestinal: Negative for nausea, vomiting, blood in stool and abdominal distention.  Genitourinary: Negative for urgency, frequency and difficulty urinating.  Musculoskeletal: Positive for back pain.       Positive for right shoulder pain  Neurological: Negative for dizziness and numbness.  Hematological: Negative for adenopathy.       Objective:   Physical Exam  Constitutional: She is oriented to person, place, and time. She appears well-developed and well-nourished. No distress.  HENT:  Head: Normocephalic and atraumatic.  Mouth/Throat: No oropharyngeal exudate.  Eyes: Conjunctivae and EOM are normal. Pupils  are equal, round, and reactive to light.  Neck: Normal range of motion. Neck supple. No JVD present. No tracheal deviation present. No thyromegaly present.  Cardiovascular: Normal rate, regular rhythm, normal heart sounds and intact distal pulses.  Exam reveals no gallop and no friction rub.   No murmur heard. Pulmonary/Chest: Effort normal and breath sounds normal. No stridor. No respiratory distress. She has no wheezes. She has no rales. She exhibits no tenderness.  Abdominal: Soft. Bowel sounds are normal. She exhibits no distension and no mass. There is no tenderness. There is no rebound and no guarding.  Musculoskeletal:       Restricted ROM with right shoulder.  Lymphadenopathy:    She has no cervical adenopathy.  Neurological: She is alert and oriented to person, place, and time. She has normal reflexes. She displays normal reflexes. No cranial nerve deficit. She exhibits normal muscle tone. Coordination normal.  Skin: Skin is warm. She is not diaphoretic.          Assessment & Plan:

## 2011-08-16 NOTE — Assessment & Plan Note (Signed)
She got a flu- shot today.  

## 2011-08-18 ENCOUNTER — Other Ambulatory Visit: Payer: Self-pay | Admitting: Internal Medicine

## 2011-08-19 NOTE — Telephone Encounter (Signed)
Called to pharm 

## 2011-08-21 ENCOUNTER — Telehealth: Payer: Self-pay | Admitting: Internal Medicine

## 2011-08-23 ENCOUNTER — Other Ambulatory Visit: Payer: Self-pay | Admitting: *Deleted

## 2011-08-23 DIAGNOSIS — E119 Type 2 diabetes mellitus without complications: Secondary | ICD-10-CM

## 2011-08-23 MED ORDER — INSULIN ASPART 100 UNIT/ML ~~LOC~~ SOLN: SUBCUTANEOUS | Status: DC

## 2011-08-23 NOTE — Telephone Encounter (Signed)
[name], pharmacist at [facility] calls and states pt stated her insulin doses have been increased, the only place i find this is [name] Rice's note of 10/22 where she speaks of 45 units w/ pm meal and 50 units at daytime meal(s). Please advise

## 2011-08-23 NOTE — Telephone Encounter (Signed)
I called the patient and she told me that Lupita Leash suggested some changes to her insulin regimen with her last meeting.  My understanding was to continue her on Novolog 40 units three times a day with meals with an event of hypoglycemia she reported, but the patient was following what she discussed with Lupita Leash. She reports that her CBG's have been running in 100's - 200's with new regimen. She does not report any more episodes of hypoglycemia. I trust her in managing her CBG's by using the insulin corrections, she discussed with Lupita Leash . So, I will change her Novolog regimen as to what she discussed with Lupita Leash

## 2011-08-28 ENCOUNTER — Other Ambulatory Visit: Payer: Self-pay | Admitting: Internal Medicine

## 2011-08-29 ENCOUNTER — Other Ambulatory Visit: Payer: Self-pay | Admitting: Internal Medicine

## 2011-09-02 ENCOUNTER — Ambulatory Visit: Payer: Medicare Other | Attending: Internal Medicine | Admitting: Physical Therapy

## 2011-09-02 ENCOUNTER — Telehealth: Payer: Self-pay | Admitting: Dietician

## 2011-09-02 DIAGNOSIS — R269 Unspecified abnormalities of gait and mobility: Secondary | ICD-10-CM | POA: Insufficient documentation

## 2011-09-02 DIAGNOSIS — IMO0001 Reserved for inherently not codable concepts without codable children: Secondary | ICD-10-CM | POA: Insufficient documentation

## 2011-09-02 DIAGNOSIS — R42 Dizziness and giddiness: Secondary | ICD-10-CM | POA: Insufficient documentation

## 2011-09-02 NOTE — Telephone Encounter (Signed)
I would try to call her again on Monday to check on her blood sugars . She may need a sooner appointment for adjustments in insulin dose if needed.

## 2011-09-02 NOTE — Telephone Encounter (Signed)
Did not sleep last night , yesterday blood sugar okay,  300 around supper, 4 am- 189, took 5 units, then got up and it was 407 at 7 am. Ate more than she should;ve last night [place] sanwich cereal, peanutbutterl crackers, chops at 9:30 pm. Ate 2 eggs and 1 slice toast- took 73 units of Novolog Then checked in less than 1 hour later 260. Now it is 129

## 2011-09-02 NOTE — Telephone Encounter (Signed)
Previous note was from patient phone call. She felt fine - was concerned that her meter may be giving her false readings because the 407 blood sugar this am was so unusual. Discussed that she should always wash hands and retest to confirm that it is an accurate reading. Suspect it read something on her hands.  Patient reports she has noticed a pattern of her blood sugar being close to target fasting, but too high before her second meal of the day from eating too much for her first meal. Discussed choice of increasing Novolog dose (for example to 50 units if she's been taking 45 units) with first meal vs eating less. Patient reiterated that she only eats 2 meals a day  Patient felt better after conversation that her meter is okay and how to handle extraneous results.

## 2011-09-06 NOTE — Telephone Encounter (Signed)
Called patient to follow up on blood sugars. Still a little high, back and shoulder pain. 50 in morning and 45 in pm, is sleeping too much, which she thinks is messing up her blood sugars.  Wonders if she has a Sinus/eye infection vs. C-pap vs exzema, has had red right eye and drainage.eyes matted twice this week. Wants to try solution for dry eyes first, does want an appointment.   Wants me to let Dr. Dorthula Rue know that she is Going to orthopedic doctor   Was on [place] in 2006 and thinks it did help her blood sugars.  Have routed this note to triage nurses and will ask them to call patient first thing in AM since phones are off.

## 2011-09-07 ENCOUNTER — Ambulatory Visit (INDEPENDENT_AMBULATORY_CARE_PROVIDER_SITE_OTHER): Payer: Medicare Other | Admitting: Internal Medicine

## 2011-09-07 VITALS — BP 133/66 | HR 78 | Temp 97.4°F | Ht 63.0 in | Wt 276.1 lb

## 2011-09-07 DIAGNOSIS — E119 Type 2 diabetes mellitus without complications: Secondary | ICD-10-CM

## 2011-09-07 DIAGNOSIS — M25519 Pain in unspecified shoulder: Secondary | ICD-10-CM

## 2011-09-07 DIAGNOSIS — Z8709 Personal history of other diseases of the respiratory system: Secondary | ICD-10-CM | POA: Insufficient documentation

## 2011-09-07 DIAGNOSIS — M25511 Pain in right shoulder: Secondary | ICD-10-CM

## 2011-09-07 DIAGNOSIS — Z0000 Encounter for general adult medical examination without abnormal findings: Secondary | ICD-10-CM

## 2011-09-07 DIAGNOSIS — J019 Acute sinusitis, unspecified: Secondary | ICD-10-CM

## 2011-09-07 MED ORDER — LORATADINE-PSEUDOEPHEDRINE ER 5-120 MG PO TB12: 1.0000 | ORAL_TABLET | Freq: Two times a day (BID) | ORAL | Status: DC

## 2011-09-07 NOTE — Progress Notes (Signed)
  Subjective:    Patient ID: [name] Rice, female    DOB: [birth year], 55 y.o.   MRN: [id number]  HPI Ms. [name] Rice is a 55 year old female with past medical history as noted in the chart. She comes in today with acute complaints of sneezing, runny nose, clear discharge and redness in the eyes and headaches. Patient has asthma and currently taking Singulair 10 mg a day to avoid exacerbation. Patient denies any fever, chills, nausea, vomiting or change in appetite.  She's complaining of pain in her right shoulder and has an appointment with her orthopedic on Tuesday next week.  Patient is up-to-date on all her vaccinations. She requests shingles vaccine as she has heard a lot of stories of patients being in pain once they get shingles.  No other complaints at this time.    Review of Systems  Constitutional: Negative for fever, chills, activity change and appetite change.  HENT: Positive for congestion, rhinorrhea, sneezing and sinus pressure. Negative for sore throat.   Eyes: Positive for discharge, redness and itching.  Respiratory: Negative for cough and shortness of breath.   Cardiovascular: Negative for chest pain and leg swelling.  Gastrointestinal: Negative for nausea, abdominal pain, diarrhea, constipation and abdominal distention.  Genitourinary: Negative for frequency, hematuria and difficulty urinating.  Musculoskeletal: Positive for arthralgias.  Neurological: Negative for dizziness and headaches.  Psychiatric/Behavioral: Negative for suicidal ideas and behavioral problems.       Objective:   Physical Exam  Constitutional: She is oriented to person, place, and time. She appears well-developed and well-nourished.  HENT:  Head: Normocephalic and atraumatic.       Nasal congestion without any swollen turbinates. Patient did not have congested throat. No lymphadenopathy noted  Eyes: Conjunctivae and EOM are normal. Pupils are equal, round, and reactive to light. No scleral  icterus.  Neck: Normal range of motion. Neck supple. No JVD present. No thyromegaly present.  Cardiovascular: Normal rate, regular rhythm, normal heart sounds and intact distal pulses.  Exam reveals no gallop and no friction rub.   No murmur heard. Pulmonary/Chest: Effort normal and breath sounds normal. No respiratory distress. She has no wheezes. She has no rales.  Abdominal: Soft. Bowel sounds are normal. She exhibits no distension and no mass. There is no tenderness. There is no rebound and no guarding.  Musculoskeletal: Normal range of motion. She exhibits no edema and no tenderness.  Lymphadenopathy:    She has no cervical adenopathy.  Neurological: She is alert and oriented to person, place, and time.  Psychiatric: She has a normal mood and affect. Her behavior is normal.          Assessment & Plan:

## 2011-09-07 NOTE — Assessment & Plan Note (Addendum)
Patient was advised steam inhalation 2-3 times a day. Avoid allergens by covering her nose when outside. Was given a prescription for Claritin-D for both decreasing the amount of secretion and symptomatic relief. Asked to followup in the clinic if she gets worse. Follow home instructions on the discharge instruction sheet.

## 2011-09-07 NOTE — Assessment & Plan Note (Signed)
She was given prescription for shingles vaccine.

## 2011-09-07 NOTE — Assessment & Plan Note (Signed)
She has appointment with orthopedics coming up next Monday. she is trying to avoid surgery.

## 2011-09-07 NOTE — Patient Instructions (Signed)
Allergic Rhinitis Allergic rhinitis is when the mucous membranes in the nose respond to allergens. Allergens are particles in the air that cause your body to have an allergic reaction. This causes you to release allergic antibodies. Through a chain of events, these eventually cause you to release histamine into the blood stream (hence the use of antihistamines). Although meant to be protective to the body, it is this release that causes your discomfort, such as frequent sneezing, congestion and an itchy runny nose.  CAUSES  The pollen allergens may come from grasses, trees, and weeds. This is seasonal allergic rhinitis, or "hay fever." Other allergens cause year-round allergic rhinitis (perennial allergic rhinitis) such as house dust mite allergen, pet dander and mold spores.  SYMPTOMS   Nasal stuffiness (congestion).   Runny, itchy nose with sneezing and tearing of the eyes.   There is often an itching of the mouth, eyes and ears.  It cannot be cured, but it can be controlled with medications. DIAGNOSIS  If you are unable to determine the offending allergen, skin or blood testing may find it. TREATMENT   Avoid the allergen.   Medications and allergy shots (immunotherapy) can help.   Hay fever may often be treated with antihistamines in pill or nasal spray forms. Antihistamines block the effects of histamine. There are over-the-counter medicines that may help with nasal congestion and swelling around the eyes. Check with your caregiver before taking or giving this medicine.  If the treatment above does not work, there are many new medications your caregiver can prescribe. Stronger medications may be used if initial measures are ineffective. Desensitizing injections can be used if medications and avoidance fails. Desensitization is when a patient is given ongoing shots until the body becomes less sensitive to the allergen. Make sure you follow up with your caregiver if problems continue. SEEK  MEDICAL CARE IF:   You develop fever (more than 100.5 F (38.1 C).   You develop a cough that does not stop easily (persistent).   You have shortness of breath.   You start wheezing.   Symptoms interfere with normal daily activities.  Document Released: 06/28/2001 Document Revised: 06/15/2011 Document Reviewed: 01/07/2009 Exit[employer] Patient Information 2012 [employer]. 

## 2011-09-07 NOTE — Telephone Encounter (Signed)
Call to pt said that her eyes are red and draining -thinks she may have an eye infection.  Pt given an appointment for 10;15 this am. Angelina Ok, RN 09/07/2011 9:11 AM

## 2011-09-12 ENCOUNTER — Other Ambulatory Visit: Payer: Self-pay | Admitting: Internal Medicine

## 2011-09-15 ENCOUNTER — Other Ambulatory Visit: Payer: Self-pay | Admitting: Internal Medicine

## 2011-09-15 ENCOUNTER — Telehealth: Payer: Self-pay | Admitting: *Deleted

## 2011-09-15 NOTE — Telephone Encounter (Signed)
Pt calls and requests surgical clearance appt., will bring paperwork, appt per sharonb. For 12/3 at 1015

## 2011-09-16 NOTE — Telephone Encounter (Signed)
Called to pharm 

## 2011-09-19 ENCOUNTER — Ambulatory Visit: Payer: Medicare Other | Admitting: Internal Medicine

## 2011-09-22 ENCOUNTER — Other Ambulatory Visit: Payer: Self-pay | Admitting: Internal Medicine

## 2011-09-22 ENCOUNTER — Encounter: Payer: Self-pay | Admitting: Internal Medicine

## 2011-09-22 ENCOUNTER — Ambulatory Visit (INDEPENDENT_AMBULATORY_CARE_PROVIDER_SITE_OTHER): Payer: Medicare Other | Admitting: Internal Medicine

## 2011-09-22 DIAGNOSIS — M25511 Pain in right shoulder: Secondary | ICD-10-CM

## 2011-09-22 DIAGNOSIS — M25519 Pain in unspecified shoulder: Secondary | ICD-10-CM

## 2011-09-22 DIAGNOSIS — E119 Type 2 diabetes mellitus without complications: Secondary | ICD-10-CM

## 2011-09-22 NOTE — Assessment & Plan Note (Signed)
Secondary to rotator cuff tear for which she is going to have arthroscopy procedure soon with orthopedics. I did sign the papers for medical clearance for surgery.

## 2011-09-22 NOTE — Patient Instructions (Signed)
Please make a followup appointment as needed after the shoulder surgery. Please take all  Your medications regularly. Give [place] a call if anything comes up meanwhile for which you needed early appointment. Take your insulin and metformin regularly especially prior to surgery to keep you sugar level under control.

## 2011-09-22 NOTE — Progress Notes (Signed)
  Subjective:    Patient ID: [name] Rice, female    DOB: [birth year], 55 y.o.   MRN: [id number]  HPI [name] Rice is a pleasant 55 year old woman with past medical history of DM 2, depression, right rotator cuff tear who comes the clinic for surgical clearance for her arthroscopic procedure for right shoulder manipulation. She saw Dr. Shelle Iron with [facility] orthopedics for her right shoulder pain was diagnosed with rotator cuff tear and had steroid injections in past. Now she is going to have arthroscopic manipulation of right shoulder and considering her complex medical condition, the need medical clearance from [place].  Recent HbA1c was 7.4 and her blood pressure is 133/72 today with pulse of 70. Overall she looks at baseline and did not complain of any fever, chills, nausea vomiting or abdominal pain, chest pain, short of breath, headache.  I would clear her for surgery from medical point of view-unless her blood pressure and if surgery is less than 170/110, and CBG is less than 300 or as per the preference of orthopedic surgeon.  She also needs prescription for new insulin meter and test strips.  Review of Systems    as per history of present illness, all other systems reviewed and negative. Objective:   Physical Exam  Vitals: Reviewed and stable.  General: NAD HEENT: PERRL, EOMI, no scleral icterus Cardiac: RRR, no rubs, murmurs or gallops Pulm: clear to auscultation bilaterally, moving normal volumes of air Abd: soft, nontender, nondistended, BS present Ext: warm and well perfused, no pedal edema Neuro: alert and oriented X3, cranial nerves II-XII grossly intact.       Assessment & Plan:

## 2011-09-22 NOTE — Assessment & Plan Note (Signed)
I gave her prescription for new diabetes meter and strips-which she was getting free for a month's supply. She brought card with her-and I did give her a written prescription for that.

## 2011-09-26 ENCOUNTER — Telehealth: Payer: Self-pay | Admitting: *Deleted

## 2011-09-26 NOTE — Telephone Encounter (Signed)
Thank you, Hanh Kertesz 

## 2011-09-26 NOTE — Telephone Encounter (Signed)
FYI Pt called to let you know her  orthopedic surgery, on her rt shoulder is scheduled with Dr Shelle Iron on January 10th at [facility] at 1130.

## 2011-09-28 ENCOUNTER — Other Ambulatory Visit: Payer: Self-pay | Admitting: Otolaryngology

## 2011-09-29 ENCOUNTER — Other Ambulatory Visit: Payer: Self-pay | Admitting: Internal Medicine

## 2011-09-29 DIAGNOSIS — J449 Chronic obstructive pulmonary disease, unspecified: Secondary | ICD-10-CM

## 2011-10-05 ENCOUNTER — Other Ambulatory Visit: Payer: Self-pay | Admitting: Internal Medicine

## 2011-10-09 ENCOUNTER — Other Ambulatory Visit: Payer: Self-pay | Admitting: Internal Medicine

## 2011-10-09 DIAGNOSIS — K219 Gastro-esophageal reflux disease without esophagitis: Secondary | ICD-10-CM

## 2011-10-09 DIAGNOSIS — F329 Major depressive disorder, single episode, unspecified: Secondary | ICD-10-CM

## 2011-10-09 DIAGNOSIS — F32A Depression, unspecified: Secondary | ICD-10-CM

## 2011-10-09 DIAGNOSIS — M25519 Pain in unspecified shoulder: Secondary | ICD-10-CM

## 2011-10-12 ENCOUNTER — Encounter (HOSPITAL_COMMUNITY): Payer: Self-pay | Admitting: Pharmacy Technician

## 2011-10-14 ENCOUNTER — Other Ambulatory Visit: Payer: Self-pay | Admitting: Internal Medicine

## 2011-10-14 NOTE — Telephone Encounter (Signed)
Refills for Zolpidem and Lyrica called to the [facility].  Angelina Ok, RN 10/14/2011 8:54 AM

## 2011-10-19 ENCOUNTER — Other Ambulatory Visit: Payer: Self-pay | Admitting: Internal Medicine

## 2011-10-21 ENCOUNTER — Other Ambulatory Visit: Payer: Self-pay

## 2011-10-21 ENCOUNTER — Encounter (HOSPITAL_COMMUNITY): Payer: Self-pay

## 2011-10-21 ENCOUNTER — Ambulatory Visit (HOSPITAL_COMMUNITY)
Admission: RE | Admit: 2011-10-21 | Discharge: 2011-10-21 | Disposition: A | Discharge status: Home / Self Care | Payer: Medicare Other | Source: Ambulatory Visit | Attending: Specialist | Admitting: Specialist

## 2011-10-21 ENCOUNTER — Encounter (HOSPITAL_COMMUNITY)
Admission: RE | Admit: 2011-10-21 | Discharge: 2011-10-21 | Disposition: A | Discharge status: Home / Self Care | Payer: Medicare Other | Source: Ambulatory Visit | Attending: Specialist | Admitting: Specialist

## 2011-10-21 DIAGNOSIS — Z01818 Encounter for other preprocedural examination: Secondary | ICD-10-CM | POA: Diagnosis not present

## 2011-10-21 DIAGNOSIS — Z01811 Encounter for preprocedural respiratory examination: Secondary | ICD-10-CM | POA: Diagnosis not present

## 2011-10-21 DIAGNOSIS — I10 Essential (primary) hypertension: Secondary | ICD-10-CM | POA: Diagnosis not present

## 2011-10-21 DIAGNOSIS — Z87891 Personal history of nicotine dependence: Secondary | ICD-10-CM | POA: Diagnosis not present

## 2011-10-21 DIAGNOSIS — Z01812 Encounter for preprocedural laboratory examination: Secondary | ICD-10-CM | POA: Insufficient documentation

## 2011-10-21 DIAGNOSIS — E119 Type 2 diabetes mellitus without complications: Secondary | ICD-10-CM | POA: Diagnosis not present

## 2011-10-21 DIAGNOSIS — R9431 Abnormal electrocardiogram [ECG] [EKG]: Secondary | ICD-10-CM | POA: Insufficient documentation

## 2011-10-21 DIAGNOSIS — Z01810 Encounter for preprocedural cardiovascular examination: Secondary | ICD-10-CM | POA: Insufficient documentation

## 2011-10-21 HISTORY — DX: Carbuncle, unspecified: L02.93

## 2011-10-21 HISTORY — DX: Furuncle, unspecified: L02.92

## 2011-10-21 HISTORY — DX: Personal history of urinary calculi: Z87.442

## 2011-10-21 HISTORY — DX: Spasmodic torticollis: G24.3

## 2011-10-21 HISTORY — DX: Diarrhea, unspecified: R19.7

## 2011-10-21 HISTORY — DX: Tremor, unspecified: R25.1

## 2011-10-21 HISTORY — DX: Sleep apnea, unspecified: G47.30

## 2011-10-21 HISTORY — DX: Unspecified rotator cuff tear or rupture of unspecified shoulder, not specified as traumatic: M75.100

## 2011-10-21 LAB — CBC
HCT: 38.1 % (ref 36.0–46.0)
Hemoglobin: 11.8 g/dL — ABNORMAL LOW (ref 12.0–15.0)
MCH: 25.2 pg — ABNORMAL LOW (ref 26.0–34.0)
MCHC: 31 g/dL (ref 30.0–36.0)
MCV: 81.2 fL (ref 78.0–100.0)

## 2011-10-21 LAB — PROTIME-INR
INR: 1 (ref 0.00–1.49)
Prothrombin Time: 13.4 seconds (ref 11.6–15.2)

## 2011-10-21 LAB — COMPREHENSIVE METABOLIC PANEL
BUN: 14 mg/dL (ref 6–23)
CO2: 26 mEq/L (ref 19–32)
Calcium: 9.5 mg/dL (ref 8.4–10.5)
Creatinine, Ser: 1.18 mg/dL — ABNORMAL HIGH (ref 0.50–1.10)
GFR calc Af Amer: 58 mL/min — ABNORMAL LOW (ref >90)
GFR calc non Af Amer: 50 mL/min — ABNORMAL LOW (ref >90)
Glucose, Bld: 35 mg/dL — CL (ref 70–99)
Sodium: 139 mEq/L (ref 135–145)
Total Protein: 7.5 g/dL (ref 6.0–8.3)

## 2011-10-21 LAB — URINALYSIS, ROUTINE W REFLEX MICROSCOPIC
Bilirubin Urine: NEGATIVE
Hgb urine dipstick: NEGATIVE
Nitrite: NEGATIVE
Protein, ur: NEGATIVE mg/dL
Specific Gravity, Urine: 1.022 (ref 1.005–1.030)
Urobilinogen, UA: 0.2 mg/dL (ref 0.0–1.0)

## 2011-10-21 LAB — URINE MICROSCOPIC-ADD ON

## 2011-10-21 LAB — DIFFERENTIAL
Basophils Absolute: 0 10*3/uL (ref 0.0–0.1)
Lymphocytes Relative: 20 % (ref 12–46)
Lymphs Abs: 2.7 10*3/uL (ref 0.7–4.0)
Neutrophils Relative %: 73 % (ref 43–77)

## 2011-10-21 LAB — SURGICAL PCR SCREEN
MRSA, PCR: NEGATIVE
Staphylococcus aureus: POSITIVE — AB

## 2011-10-21 MED ORDER — CHLORHEXIDINE GLUCONATE 4 % EX LIQD: 60.0000 mL | Freq: Once | CUTANEOUS | Status: DC

## 2011-10-21 NOTE — Patient Instructions (Addendum)
20 [name] Rice  10/21/2011   Your procedure is scheduled on:  10/27/11  Report to [facility] at 9:30 AM.  Call this number if you have problems the morning of surgery: [phone]   Remember:   Do not eat food:After Midnight.  May have clear liquids:until Midnight .  Clear liquids include soda, tea, black coffee, apple or grape juice, broth.  Take these medicines the morning of surgery with A SIP OF WATER: ALLOPURINOL / CYMBALTA / LYRICA / PANTOPRAZOLE / PROPRANOLOL / CLARITIN / NORCO / ROBAXIN / PRAVASTATIN / USE ADVAIR AND EYE DROPS IF NEEDED   Do not wear jewelry, make-up or nail polish.  Do not wear lotions, powders, or perfumes. You may wear deodorant.  Do not shave 48 hours prior to surgery.  Do not bring valuables to the hospital.  Contacts, dentures or bridgework may not be worn into surgery.  Leave suitcase in the car. After surgery it may be brought to your room.  For patients admitted to the hospital, checkout time is 11:00 AM the day of discharge.   Patients discharged the day of surgery will not be allowed to drive home.  Name and phone number of your driver:  Special Instructions: CHG Shower Use Special Wash: 1/2 bottle night before surgery and 1/2 bottle morning of surgery.   Please read over the following fact sheets that you were given: MRSA Information            BRING C PAP MASK

## 2011-10-21 NOTE — Telephone Encounter (Signed)
I refilled Cymbalta 20 mg; will defer the hydrocodone refill to PCP.

## 2011-10-21 NOTE — Progress Notes (Signed)
Received call from lab that pt had low blood sugar result of 35mg /dl. Patient was seen in PST at 1600 and has left the hospital. Call pt on cell. She stated she could tell her blood sugar was low and ate as soon as she left the hospital. Reviewed symptoms of hypoglycemia with pt. She is presently at grocery store shopping and feels fine.

## 2011-10-24 ENCOUNTER — Other Ambulatory Visit: Payer: Self-pay | Admitting: *Deleted

## 2011-10-24 MED ORDER — HYDROCODONE-ACETAMINOPHEN 5-325 MG PO TABS: 1.0000 | ORAL_TABLET | Freq: Four times a day (QID) | ORAL | Status: DC | PRN

## 2011-10-24 NOTE — Telephone Encounter (Signed)
LYRICA AND AMBIEN RXS CALLED TO [facility].

## 2011-10-26 NOTE — Telephone Encounter (Signed)
This pt's pcp is in [place], please review

## 2011-10-26 NOTE — Telephone Encounter (Signed)
Pt has no intention of picking refill up, she will have surgery tomorrow and has 2 pills left from last fill, she will inform dr Shelle Iron of the script on hold at [facility]

## 2011-10-27 ENCOUNTER — Encounter (HOSPITAL_COMMUNITY)
Admission: RE | Disposition: A | Discharge status: Home / Self Care | Payer: Self-pay | Source: Ambulatory Visit | Attending: Specialist

## 2011-10-27 ENCOUNTER — Inpatient Hospital Stay (HOSPITAL_COMMUNITY): Payer: Medicare Other | Admitting: Anesthesiology

## 2011-10-27 ENCOUNTER — Encounter (HOSPITAL_COMMUNITY): Payer: Self-pay | Admitting: Anesthesiology

## 2011-10-27 ENCOUNTER — Observation Stay (HOSPITAL_COMMUNITY)
Admission: RE | Admit: 2011-10-27 | Discharge: 2011-10-28 | Disposition: A | Discharge status: Home / Self Care | Payer: Medicare Other | Source: Ambulatory Visit | Attending: Specialist | Admitting: Specialist

## 2011-10-27 ENCOUNTER — Encounter (HOSPITAL_COMMUNITY): Payer: Self-pay | Admitting: *Deleted

## 2011-10-27 DIAGNOSIS — Z794 Long term (current) use of insulin: Secondary | ICD-10-CM | POA: Insufficient documentation

## 2011-10-27 DIAGNOSIS — E785 Hyperlipidemia, unspecified: Secondary | ICD-10-CM | POA: Insufficient documentation

## 2011-10-27 DIAGNOSIS — F329 Major depressive disorder, single episode, unspecified: Secondary | ICD-10-CM

## 2011-10-27 DIAGNOSIS — S43429A Sprain of unspecified rotator cuff capsule, initial encounter: Secondary | ICD-10-CM | POA: Diagnosis not present

## 2011-10-27 DIAGNOSIS — I10 Essential (primary) hypertension: Secondary | ICD-10-CM | POA: Insufficient documentation

## 2011-10-27 DIAGNOSIS — M25511 Pain in right shoulder: Secondary | ICD-10-CM

## 2011-10-27 DIAGNOSIS — J45909 Unspecified asthma, uncomplicated: Secondary | ICD-10-CM | POA: Diagnosis not present

## 2011-10-27 DIAGNOSIS — M25819 Other specified joint disorders, unspecified shoulder: Secondary | ICD-10-CM | POA: Diagnosis not present

## 2011-10-27 DIAGNOSIS — X58XXXA Exposure to other specified factors, initial encounter: Secondary | ICD-10-CM | POA: Insufficient documentation

## 2011-10-27 DIAGNOSIS — J449 Chronic obstructive pulmonary disease, unspecified: Secondary | ICD-10-CM | POA: Insufficient documentation

## 2011-10-27 DIAGNOSIS — K219 Gastro-esophageal reflux disease without esophagitis: Secondary | ICD-10-CM | POA: Diagnosis not present

## 2011-10-27 DIAGNOSIS — Z79899 Other long term (current) drug therapy: Secondary | ICD-10-CM | POA: Diagnosis not present

## 2011-10-27 DIAGNOSIS — M25519 Pain in unspecified shoulder: Secondary | ICD-10-CM | POA: Insufficient documentation

## 2011-10-27 DIAGNOSIS — G4733 Obstructive sleep apnea (adult) (pediatric): Secondary | ICD-10-CM | POA: Diagnosis not present

## 2011-10-27 DIAGNOSIS — E119 Type 2 diabetes mellitus without complications: Secondary | ICD-10-CM | POA: Insufficient documentation

## 2011-10-27 DIAGNOSIS — F32A Depression, unspecified: Secondary | ICD-10-CM

## 2011-10-27 DIAGNOSIS — E669 Obesity, unspecified: Secondary | ICD-10-CM | POA: Diagnosis not present

## 2011-10-27 DIAGNOSIS — J4489 Other specified chronic obstructive pulmonary disease: Secondary | ICD-10-CM | POA: Insufficient documentation

## 2011-10-27 LAB — GLUCOSE, CAPILLARY
Glucose-Capillary: 193 mg/dL — ABNORMAL HIGH (ref 70–99)
Glucose-Capillary: 176 mg/dL — ABNORMAL HIGH (ref 70–99)
Glucose-Capillary: 165 mg/dL — ABNORMAL HIGH (ref 70–99)

## 2011-10-27 SURGERY — ARTHROSCOPY, SHOULDER WITH REPAIR, ROTATOR CUFF, OPEN
Anesthesia: General | Site: Shoulder | Laterality: Right | Wound class: Clean

## 2011-10-27 MED ORDER — ALBUTEROL SULFATE HFA 108 (90 BASE) MCG/ACT IN AERS
2.0000 | INHALATION_SPRAY | Freq: Four times a day (QID) | RESPIRATORY_TRACT | Status: DC | PRN
  Administered 2011-10-27: 2 via RESPIRATORY_TRACT
  Filled 2011-10-27: qty 6.7

## 2011-10-27 MED ORDER — FUROSEMIDE 40 MG PO TABS
40.0000 mg | ORAL_TABLET | ORAL | Status: DC
  Administered 2011-10-28: 40 mg via ORAL
  Filled 2011-10-27 (×2): qty 1

## 2011-10-27 MED ORDER — INSULIN GLARGINE 100 UNIT/ML ~~LOC~~ SOLN
120.0000 [IU] | Freq: Every day | SUBCUTANEOUS | Status: DC
  Administered 2011-10-28: 120 [IU] via SUBCUTANEOUS
  Filled 2011-10-27: qty 3

## 2011-10-27 MED ORDER — LIDOCAINE HCL (CARDIAC) 20 MG/ML IV SOLN
INTRAVENOUS | Status: DC | PRN
  Administered 2011-10-27: 100 mg via INTRAVENOUS

## 2011-10-27 MED ORDER — PHENOL 1.4 % MT LIQD: 1.0000 | OROMUCOSAL | Status: DC | PRN

## 2011-10-27 MED ORDER — PHENYLEPHRINE HCL 10 MG/ML IJ SOLN
INTRAMUSCULAR | Status: DC | PRN
  Administered 2011-10-27: 80 ug via INTRAVENOUS

## 2011-10-27 MED ORDER — METHOCARBAMOL 500 MG PO TABS
500.0000 mg | ORAL_TABLET | Freq: Two times a day (BID) | ORAL | Status: DC
  Administered 2011-10-27 – 2011-10-28 (×2): 500 mg via ORAL
  Filled 2011-10-27 (×4): qty 1

## 2011-10-27 MED ORDER — HYDROCODONE-ACETAMINOPHEN 5-325 MG PO TABS
1.0000 | ORAL_TABLET | ORAL | Status: DC | PRN
  Administered 2011-10-27 – 2011-10-28 (×3): 1 via ORAL
  Administered 2011-10-28: 2 via ORAL
  Filled 2011-10-27: qty 2
  Filled 2011-10-27 (×2): qty 1
  Filled 2011-10-27: qty 2

## 2011-10-27 MED ORDER — FLUTICASONE-SALMETEROL 250-50 MCG/DOSE IN AEPB
1.0000 | INHALATION_SPRAY | Freq: Two times a day (BID) | RESPIRATORY_TRACT | Status: DC
  Administered 2011-10-27: 1 via RESPIRATORY_TRACT
  Filled 2011-10-27: qty 14

## 2011-10-27 MED ORDER — FENTANYL CITRATE 0.05 MG/ML IJ SOLN
INTRAMUSCULAR | Status: DC | PRN
  Administered 2011-10-27: 50 ug via INTRAVENOUS
  Administered 2011-10-27: 100 ug via INTRAVENOUS
  Administered 2011-10-27: 50 ug via INTRAVENOUS
  Administered 2011-10-27: 100 ug via INTRAVENOUS

## 2011-10-27 MED ORDER — INSULIN ASPART 100 UNIT/ML ~~LOC~~ SOLN
0.0000 [IU] | Freq: Three times a day (TID) | SUBCUTANEOUS | Status: DC
  Administered 2011-10-28 (×2): 4 [IU] via SUBCUTANEOUS
  Filled 2011-10-27: qty 3

## 2011-10-27 MED ORDER — DULOXETINE HCL 60 MG PO CPEP
60.0000 mg | ORAL_CAPSULE | Freq: Every day | ORAL | Status: DC
  Administered 2011-10-28: 60 mg via ORAL
  Filled 2011-10-27: qty 1

## 2011-10-27 MED ORDER — HYDROMORPHONE HCL PF 1 MG/ML IJ SOLN
0.5000 mg | INTRAMUSCULAR | Status: DC | PRN
  Administered 2011-10-27: 1 mg via INTRAVENOUS
  Filled 2011-10-27: qty 1

## 2011-10-27 MED ORDER — ALLOPURINOL 300 MG PO TABS
300.0000 mg | ORAL_TABLET | Freq: Every day | ORAL | Status: DC
  Administered 2011-10-28: 300 mg via ORAL
  Filled 2011-10-27: qty 1

## 2011-10-27 MED ORDER — ZOLPIDEM TARTRATE 10 MG PO TABS
10.0000 mg | ORAL_TABLET | Freq: Every day | ORAL | Status: DC
  Administered 2011-10-27: 10 mg via ORAL
  Filled 2011-10-27: qty 1

## 2011-10-27 MED ORDER — ACETAMINOPHEN 650 MG RE SUPP: 650.0000 mg | Freq: Four times a day (QID) | RECTAL | Status: DC | PRN

## 2011-10-27 MED ORDER — EPINEPHRINE HCL 1 MG/ML IJ SOLN
INTRAMUSCULAR | Status: DC | PRN
  Administered 2011-10-27: 1 mg

## 2011-10-27 MED ORDER — DULOXETINE HCL 20 MG PO CPEP
20.0000 mg | ORAL_CAPSULE | Freq: Every day | ORAL | Status: DC
  Administered 2011-10-28: 20 mg via ORAL
  Filled 2011-10-27: qty 1

## 2011-10-27 MED ORDER — DOCUSATE SODIUM 100 MG PO CAPS
100.0000 mg | ORAL_CAPSULE | Freq: Two times a day (BID) | ORAL | Status: DC
  Administered 2011-10-27 – 2011-10-28 (×2): 100 mg via ORAL
  Filled 2011-10-27 (×3): qty 1

## 2011-10-27 MED ORDER — METOCLOPRAMIDE HCL 5 MG/ML IJ SOLN: 5.0000 mg | Freq: Three times a day (TID) | INTRAMUSCULAR | Status: DC | PRN

## 2011-10-27 MED ORDER — ACETAMINOPHEN 10 MG/ML IV SOLN
INTRAVENOUS | Status: DC | PRN
  Administered 2011-10-27: 1000 mg via INTRAVENOUS

## 2011-10-27 MED ORDER — CEFAZOLIN SODIUM 1-5 GM-% IV SOLN
1.0000 g | Freq: Once | INTRAVENOUS | Status: AC
  Administered 2011-10-27: 2 g via INTRAVENOUS

## 2011-10-27 MED ORDER — INSULIN ASPART 100 UNIT/ML ~~LOC~~ SOLN: 0.0000 [IU] | Freq: Every day | SUBCUTANEOUS | Status: DC

## 2011-10-27 MED ORDER — SODIUM CHLORIDE 0.9 % IV SOLN
INTRAVENOUS | Status: DC
  Administered 2011-10-27: 18:00:00 via INTRAVENOUS

## 2011-10-27 MED ORDER — MECLIZINE HCL 25 MG PO TABS
25.0000 mg | ORAL_TABLET | Freq: Three times a day (TID) | ORAL | Status: DC | PRN
  Filled 2011-10-27: qty 1

## 2011-10-27 MED ORDER — ACETAMINOPHEN 325 MG PO TABS: 650.0000 mg | ORAL_TABLET | Freq: Four times a day (QID) | ORAL | Status: DC | PRN

## 2011-10-27 MED ORDER — BUPIVACAINE-EPINEPHRINE 0.5% -1:200000 IJ SOLN
INTRAMUSCULAR | Status: DC | PRN
  Administered 2011-10-27: 8 mL

## 2011-10-27 MED ORDER — CEFAZOLIN SODIUM-DEXTROSE 2-3 GM-% IV SOLR: 2.0000 g | INTRAVENOUS | Status: DC

## 2011-10-27 MED ORDER — METHOCARBAMOL 500 MG PO TABS: 500.0000 mg | ORAL_TABLET | Freq: Four times a day (QID) | ORAL | Status: DC | PRN

## 2011-10-27 MED ORDER — SIMVASTATIN 20 MG PO TABS
20.0000 mg | ORAL_TABLET | Freq: Every day | ORAL | Status: DC
  Filled 2011-10-27 (×2): qty 1

## 2011-10-27 MED ORDER — SUCCINYLCHOLINE CHLORIDE 20 MG/ML IJ SOLN
INTRAMUSCULAR | Status: DC | PRN
  Administered 2011-10-27: 100 mg via INTRAVENOUS

## 2011-10-27 MED ORDER — PANTOPRAZOLE SODIUM 40 MG PO TBEC
40.0000 mg | DELAYED_RELEASE_TABLET | ORAL | Status: DC
  Filled 2011-10-27 (×2): qty 1

## 2011-10-27 MED ORDER — DULOXETINE HCL 60 MG PO CPEP: 60.0000 mg | ORAL_CAPSULE | Freq: Every day | ORAL | Status: DC

## 2011-10-27 MED ORDER — PREGABALIN 50 MG PO CAPS
100.0000 mg | ORAL_CAPSULE | Freq: Two times a day (BID) | ORAL | Status: DC
  Administered 2011-10-27 – 2011-10-28 (×2): 100 mg via ORAL
  Filled 2011-10-27: qty 1
  Filled 2011-10-27: qty 2
  Filled 2011-10-27: qty 1

## 2011-10-27 MED ORDER — OLOPATADINE HCL 0.1 % OP SOLN
1.0000 [drp] | Freq: Two times a day (BID) | OPHTHALMIC | Status: DC | PRN
  Filled 2011-10-27: qty 5

## 2011-10-27 MED ORDER — ONDANSETRON HCL 4 MG/2ML IJ SOLN: 4.0000 mg | Freq: Four times a day (QID) | INTRAMUSCULAR | Status: DC | PRN

## 2011-10-27 MED ORDER — ONDANSETRON HCL 4 MG PO TABS: 4.0000 mg | ORAL_TABLET | Freq: Four times a day (QID) | ORAL | Status: DC | PRN

## 2011-10-27 MED ORDER — LACTATED RINGERS IV SOLN
INTRAVENOUS | Status: DC
  Administered 2011-10-27: 1000 mL via INTRAVENOUS
  Administered 2011-10-27: 13:00:00 via INTRAVENOUS

## 2011-10-27 MED ORDER — PROPOFOL 10 MG/ML IV BOLUS
INTRAVENOUS | Status: DC | PRN
  Administered 2011-10-27: 150 mg via INTRAVENOUS

## 2011-10-27 MED ORDER — HYDROMORPHONE HCL PF 1 MG/ML IJ SOLN
0.2500 mg | INTRAMUSCULAR | Status: DC | PRN
  Administered 2011-10-27: 0.5 mg via INTRAVENOUS
  Administered 2011-10-27: 0.25 mg via INTRAVENOUS
  Administered 2011-10-27 (×2): 0.5 mg via INTRAVENOUS

## 2011-10-27 MED ORDER — EPHEDRINE SULFATE 50 MG/ML IJ SOLN
INTRAMUSCULAR | Status: DC | PRN
  Administered 2011-10-27: 5 mg via INTRAVENOUS
  Administered 2011-10-27: 10 mg via INTRAVENOUS

## 2011-10-27 MED ORDER — METOCLOPRAMIDE HCL 10 MG PO TABS: 5.0000 mg | ORAL_TABLET | Freq: Three times a day (TID) | ORAL | Status: DC | PRN

## 2011-10-27 MED ORDER — OXYCODONE HCL 5 MG PO TABS: 5.0000 mg | ORAL_TABLET | ORAL | Status: DC | PRN

## 2011-10-27 MED ORDER — MENTHOL 3 MG MT LOZG: 1.0000 | LOZENGE | OROMUCOSAL | Status: DC | PRN

## 2011-10-27 MED ORDER — DIPHENHYDRAMINE HCL 12.5 MG/5ML PO ELIX: 12.5000 mg | ORAL_SOLUTION | ORAL | Status: DC | PRN

## 2011-10-27 MED ORDER — METHOCARBAMOL 100 MG/ML IJ SOLN
500.0000 mg | Freq: Four times a day (QID) | INTRAVENOUS | Status: DC | PRN
  Filled 2011-10-27 (×2): qty 5

## 2011-10-27 MED ORDER — MONTELUKAST SODIUM 10 MG PO TABS
10.0000 mg | ORAL_TABLET | ORAL | Status: DC
  Filled 2011-10-27 (×2): qty 1

## 2011-10-27 MED ORDER — METFORMIN HCL 500 MG PO TABS
1000.0000 mg | ORAL_TABLET | Freq: Two times a day (BID) | ORAL | Status: DC
  Administered 2011-10-27 – 2011-10-28 (×2): 1000 mg via ORAL
  Filled 2011-10-27 (×3): qty 2

## 2011-10-27 MED ORDER — LACTATED RINGERS IR SOLN
Status: DC | PRN
  Administered 2011-10-27: 3000 mL

## 2011-10-27 MED ORDER — ONDANSETRON HCL 4 MG/2ML IJ SOLN
INTRAMUSCULAR | Status: DC | PRN
  Administered 2011-10-27: 4 mg via INTRAVENOUS

## 2011-10-27 MED ORDER — PROPRANOLOL HCL 20 MG PO TABS
20.0000 mg | ORAL_TABLET | Freq: Two times a day (BID) | ORAL | Status: DC
  Administered 2011-10-27 – 2011-10-28 (×2): 20 mg via ORAL
  Filled 2011-10-27 (×3): qty 1

## 2011-10-27 MED ORDER — CEFAZOLIN SODIUM-DEXTROSE 2-3 GM-% IV SOLR
2.0000 g | Freq: Four times a day (QID) | INTRAVENOUS | Status: AC
  Administered 2011-10-27 – 2011-10-28 (×3): 2 g via INTRAVENOUS
  Filled 2011-10-27 (×3): qty 50

## 2011-10-27 MED ORDER — INSULIN ASPART 100 UNIT/ML ~~LOC~~ SOLN
45.0000 [IU] | Freq: Three times a day (TID) | SUBCUTANEOUS | Status: DC
  Administered 2011-10-27 – 2011-10-28 (×2): 45 [IU] via SUBCUTANEOUS
  Administered 2011-10-28: 50 [IU] via SUBCUTANEOUS

## 2011-10-27 SURGICAL SUPPLY — 58 items
BLADE CUTTER GATOR 3.5 (BLADE) ×1 IMPLANT
BLADE DA 4.2 (BLADE) ×1 IMPLANT
BLADE SURG SZ11 CARB STEEL (BLADE) ×2 IMPLANT
BUR OVAL 4.0 (BURR) ×1 IMPLANT
CANNULA ACUFO 5X76 (CANNULA) ×2 IMPLANT
CHLORAPREP W/TINT 26ML (MISCELLANEOUS) IMPLANT
CLEANER TIP ELECTROSURG 2X2 (MISCELLANEOUS) IMPLANT
CLOTH BEACON ORANGE TIMEOUT ST (SAFETY) ×2 IMPLANT
DECANTER SPIKE VIAL GLASS SM (MISCELLANEOUS) ×2 IMPLANT
DRAPE ORTHO SPLIT 77X108 STRL (DRAPES)
DRAPE POUCH INSTRU U-SHP 10X18 (DRAPES) IMPLANT
DRAPE SURG ORHT 6 SPLT 77X108 (DRAPES) IMPLANT
DRAPE U-SHAPE 47X51 STRL (DRAPES) ×2 IMPLANT
DRSG EMULSION OIL 3X3 NADH (GAUZE/BANDAGES/DRESSINGS) ×2 IMPLANT
DRSG PAD ABDOMINAL 8X10 ST (GAUZE/BANDAGES/DRESSINGS) ×1 IMPLANT
DURAPREP 26ML APPLICATOR (WOUND CARE) ×2 IMPLANT
ELECT NDL TIP 2.8 STRL (NEEDLE) IMPLANT
ELECT NEEDLE TIP 2.8 STRL (NEEDLE) IMPLANT
GLOVE BIO SURGEON STRL SZ 6.5 (GLOVE) ×2 IMPLANT
GLOVE BIOGEL PI IND STRL 6.5 (GLOVE) ×1 IMPLANT
GLOVE BIOGEL PI IND STRL 8 (GLOVE) ×1 IMPLANT
GLOVE BIOGEL PI INDICATOR 6.5 (GLOVE) ×1
GLOVE BIOGEL PI INDICATOR 8 (GLOVE) ×1
GLOVE ECLIPSE 6.5 STRL STRAW (GLOVE) ×2 IMPLANT
GLOVE SURG SS PI 8.0 STRL IVOR (GLOVE) ×4 IMPLANT
GOWN PREVENTION PLUS XLARGE (GOWN DISPOSABLE) ×2 IMPLANT
KIT BASIN OR (CUSTOM PROCEDURE TRAY) ×2 IMPLANT
MANIFOLD NEPTUNE II (INSTRUMENTS) ×4 IMPLANT
NDL MA TROC 1/2 (NEEDLE) IMPLANT
NDL MA TROC 1/2 CIR (NEEDLE) IMPLANT
NDL SPNL 18GX3.5 QUINCKE PK (NEEDLE) ×1 IMPLANT
NEEDLE MA TROC 1/2 (NEEDLE) IMPLANT
NEEDLE MA TROC 1/2 CIR (NEEDLE) IMPLANT
NEEDLE SPNL 18GX3.5 QUINCKE PK (NEEDLE) ×2 IMPLANT
PACK SHOULDER CUSTOM OPM052 (CUSTOM PROCEDURE TRAY) ×2 IMPLANT
POSITIONER SURGICAL ARM (MISCELLANEOUS) ×2 IMPLANT
SET ARTHROSCOPY TUBING (MISCELLANEOUS) ×2
SET ARTHROSCOPY TUBING LN (MISCELLANEOUS) ×1 IMPLANT
SLING ARM IMMOBILIZER LRG (SOFTGOODS) ×1 IMPLANT
SLING ARM IMMOBILIZER MED (SOFTGOODS) ×2 IMPLANT
SPONGE GAUZE 4X4 12PLY (GAUZE/BANDAGES/DRESSINGS) ×1 IMPLANT
SPONGE LAP 4X18 X RAY DECT (DISPOSABLE) IMPLANT
STRAP CHIN BCCS-OSI (MISCELLANEOUS) ×2 IMPLANT
STRIP CLOSURE SKIN 1/2X4 (GAUZE/BANDAGES/DRESSINGS) IMPLANT
SUT BONE WAX W31G (SUTURE) IMPLANT
SUT ETHIBOND 0 (SUTURE) IMPLANT
SUT ETHIBOND 2 OS 4 DA (SUTURE) IMPLANT
SUT ETHILON 4 0 PS 2 18 (SUTURE) ×3 IMPLANT
SUT PROLENE 3 0 PS 2 (SUTURE) IMPLANT
SUT VIC AB 1 CT1 27 (SUTURE) ×4
SUT VIC AB 1 CT1 27XBRD ANTBC (SUTURE) IMPLANT
SUT VIC AB 1-0 CT2 27 (SUTURE) ×2 IMPLANT
SUT VIC AB 2-0 CT2 27 (SUTURE) ×1 IMPLANT
SUT VICRYL 0 UR6 27IN ABS (SUTURE) IMPLANT
SUT VICRYL 0-0 OS 2 NEEDLE (SUTURE) ×2 IMPLANT
TAPE HYPAFIX 6X30 (GAUZE/BANDAGES/DRESSINGS) ×1 IMPLANT
TROCAR Z-THREAD FIOS 5X100MM (TROCAR) ×1 IMPLANT
TUBING CONNECTING 10 (TUBING) ×2 IMPLANT

## 2011-10-27 NOTE — Preoperative (Addendum)
Beta Blockers   Reason not to administer Beta Blockers:Took Inderal this am at 0800.

## 2011-10-27 NOTE — Anesthesia Postprocedure Evaluation (Signed)
  Anesthesia Post-op Note  Patient: [name] Rice  Procedure(s) Performed:  Ernestina Patches ARTHROSCOPY WITH OPEN ROTATOR CUFF REPAIR - Right Shoulder Arthroscopy with Manipulation Under Anesthesia with open subacrominal decompression  Patient Location: PACU  Anesthesia Type: General  Level of Consciousness: awake and alert   Airway and Oxygen Therapy: Patient Spontanous Breathing  Post-op Pain: mild  Post-op Assessment: Post-op Vital signs reviewed, Patient's Cardiovascular Status Stable, Respiratory Function Stable, Patent Airway and No signs of Nausea or vomiting  Post-op Vital Signs: stable  Complications: No apparent anesthesia complications

## 2011-10-27 NOTE — Anesthesia Preprocedure Evaluation (Signed)
Anesthesia Evaluation  Patient identified by MRN, date of birth, ID band Patient awake    Reviewed: Allergy & Precautions, H&P , NPO status , Patient's Chart, lab work & pertinent test results  Airway Mallampati: II TM Distance: >3 FB Neck ROM: Full    Dental No notable dental hx.    Pulmonary neg pulmonary ROS, asthma , sleep apnea and Continuous Positive Airway Pressure Ventilation , COPD COPD inhaler, former smoker clear to auscultation  Pulmonary exam normal       Cardiovascular hypertension, Pt. on medications and Pt. on home beta blockers neg cardio ROS Regular Normal    Neuro/Psych PSYCHIATRIC DISORDERS Anxiety Depression  Neuromuscular disease Negative Neurological ROS  Negative Psych ROS   GI/Hepatic negative GI ROS, Neg liver ROS, GERD-  Medicated,  Endo/Other  Negative Endocrine ROSDiabetes mellitus-, Type 1, Insulin Dependent  Renal/GU negative Renal ROS  Genitourinary negative   Musculoskeletal negative musculoskeletal ROS (+)   Abdominal (+) obese,   Peds negative pediatric ROS (+)  Hematology negative hematology ROS (+)   Anesthesia Other Findings   Reproductive/Obstetrics negative OB ROS                           Anesthesia Physical Anesthesia Plan  ASA: III  Anesthesia Plan: General   Post-op Pain Management:    Induction: Intravenous  Airway Management Planned: Oral ETT  Additional Equipment:   Intra-op Plan:   Post-operative Plan: Extubation in OR  Informed Consent: I have reviewed the patients History and Physical, chart, labs and discussed the procedure including the risks, benefits and alternatives for the proposed anesthesia with the patient or authorized representative who has indicated his/her understanding and acceptance.   Dental advisory given  Plan Discussed with: CRNA  Anesthesia Plan Comments: (Dr. Shelle Iron declined block.)        Anesthesia  Quick Evaluation

## 2011-10-27 NOTE — H&P (Signed)
[name] Rice is an 56 y.o. female.   Chief Complaint: right shoulder pain HPI: impingement syndrome right shoulder  Past Medical History  Diagnosis Date  . Anxiety   . Arthritis   . Asthma   . COPD (chronic obstructive pulmonary disease)   . Depression   . Diabetes mellitus   . GERD (gastroesophageal reflux disease)   . Hypertension   . HLD (hyperlipidemia)   . OSA on CPAP 2008  . Dizziness   . History of colonic polyps   . Lumbar pain 2011    per x-ray - Multilevel spondylosis  . Cervical dystonia   . Tremor CCENTRAL NERVOUS SYSTEM  . Torn rotator cuff RT SHOULDER  . Dermatitis   . Diarrhea   . History of kidney stones   . Sleep apnea     USES C-PAP  . Recurrent boils     "SLUSTER BOILS" ON ABD AND LABIA    Past Surgical History  Procedure Date  . Pilonidal cyst excision 2956,2130  . Tubal ligation   . Lumbar laminectomy   . Carpal tunnel release 2006    left  . Elbow surgery LEFT  . Knee arthroscopy LEFT  . Nasal sinus surgery   . Hand surgery RIGHT    Family History  Problem Relation Age of Onset  . Heart disease Brother   . Stroke Neg Hx   . Cancer Neg Hx    Social History:  reports that she has quit smoking. She quit smokeless tobacco use about 14 years ago. She reports that she does not drink alcohol or use illicit drugs.  Allergies:  Allergies  Allergen Reactions  . Ace Inhibitors   . Cefaclor     REACTION: hives  . Shrimp (Shellfish Allergy) Nausea And Vomiting  . Sulfonamide Derivatives     REACTION: rash    Medications Prior to Admission  Medication Dose Route Frequency Provider Last Rate Last Dose  . lactated ringers infusion   Intravenous Continuous Liam Graham, PA      . DISCONTD: ceFAZolin (ANCEF) IVPB 2 g/50 mL premix  2 g Intravenous 60 min Pre-Op Liam Graham, PA       Medications Prior to Admission  Medication Sig Dispense Refill  . allopurinol (ZYLOPRIM) 300 MG tablet Take 1 tablet (300 mg total) by mouth  daily.  90 tablet  3  . furosemide (LASIX) 40 MG tablet Take 40 mg by mouth every morning.        . insulin aspart (NOVOLOG) 100 UNIT/ML injection Inject 45-50 Units into the skin 3 (three) times daily before meals. Inject 50 units with first meal, 45 units with second,  45 units with third meal. adding correction amount to each as needed up to a total of 150 units a day      . insulin glargine (LANTUS) 100 UNIT/ML injection Inject 120 Units into the skin daily with breakfast.       . metFORMIN (GLUCOPHAGE) 1000 MG tablet Take 1 tablet (1,000 mg total) by mouth 2 (two) times daily with a meal.  180 tablet  3  . mometasone (NASONEX) 50 MCG/ACT nasal spray Place 2 sprays into the nose daily.       Marland Kitchen olopatadine (PATANOL) 0.1 % ophthalmic solution Place 1 drop into both eyes 2 (two) times daily as needed. Itching eyes       . pravastatin (PRAVACHOL) 40 MG tablet Take 40 mg by mouth every morning.       Marland Kitchen  propranolol (INDERAL) 20 MG tablet Take 1 tablet (20 mg total) by mouth 2 (two) times daily.  60 tablet  11  . triamcinolone cream (KENALOG) 0.5 % Apply 1 application topically 2 (two) times daily as needed. Itching        . albuterol (VENTOLIN HFA) 108 (90 BASE) MCG/ACT inhaler Inhale 2 puffs into the lungs every 6 (six) hours as needed. Wheezing       . DULoxetine (CYMBALTA) 60 MG capsule        . glucose blood (ONE TOUCH ULTRA TEST) test strip Use as instructed  100 each  12  . Insulin Pen Needle (B-D UF III MINI PEN NEEDLES) 31G X 5 MM MISC 1 each by Does not apply route 3 (three) times daily.  100 each  11  . meclizine (ANTIVERT) 25 MG tablet TAKE 1 TABLET BY MOUTH THREE TIMES DAILY FOR DIZZINESS OR NAUSEA  30 tablet  0  . SINGULAIR 10 MG tablet TAKE 1 TABLET BY MOUTH ONCE DAILY  96 tablet  2  . Syringe/Needle, Disp, (B-D ECLIPSE SYRINGE) 30G X 1/2" 1 ML MISC 1 Syringe by Does not apply route 3 (three) times daily.          Results for orders placed during the hospital encounter of 10/27/11  (from the past 48 hour(s))  GLUCOSE, CAPILLARY     Status: Abnormal   Collection Time   10/27/11  9:44 AM      Component Value Range Comment   Glucose-Capillary 193 (*) 70 - 99 (mg/dL)    No results found.  Review of Systems  Constitutional: Negative.   HENT: Negative.   Eyes: Negative.   Respiratory: Positive for cough.   Cardiovascular: Negative.   Genitourinary: Negative.   Musculoskeletal: Positive for myalgias and joint pain.  Skin: Negative.   Neurological: Negative.   Endo/Heme/Allergies: Negative.   Psychiatric/Behavioral: Negative.     Blood pressure 120/59, pulse 84, temperature 98.3 F (36.8 C), resp. rate 20, last menstrual period 10/18/1995, SpO2 98.00%. Physical Exam  Constitutional: She is oriented to person, place, and time. She appears well-developed.  HENT:  Head: Normocephalic.  Eyes: Pupils are equal, round, and reactive to light.  Neck: Normal range of motion.  Cardiovascular: Normal rate.   Respiratory: Effort normal.  GI: Soft.  Musculoskeletal: She exhibits tenderness.       Positive  impingement right shoulder.NVI.   Neurological: She is alert and oriented to person, place, and time.  Skin: Skin is warm.  Psychiatric: She has a normal mood and affect.     Assessment/Plan Right RC arthropathy. Plan R SA SAD possible RCR. Risks discussed.  Terrel Manalo C 10/27/2011, 10:50 AM

## 2011-10-27 NOTE — Transfer of Care (Signed)
Immediate Anesthesia Transfer of Care Note  Patient: [name] Rice  Procedure(s) Performed:  Ernestina Patches ARTHROSCOPY WITH OPEN ROTATOR CUFF REPAIR - Right Shoulder Arthroscopy with Manipulation Under Anesthesia with open subacrominal decompression  Patient Location: PACU  Anesthesia Type: General  Level of Consciousness: sedated and patient cooperative  Airway & Oxygen Therapy: Patient Spontanous Breathing and Patient connected to face mask oxygen  Post-op Assessment: Report given to PACU RN, Post -op Vital signs reviewed and stable and Patient moving all extremities  Post vital signs: Reviewed and stable Filed Vitals:   10/27/11 0919  BP: 120/59  Pulse: 84  Temp: 36.8 C  Resp: 20    Complications: No apparent anesthesia complications

## 2011-10-27 NOTE — Brief Op Note (Signed)
10/27/2011  1:02 PM  PATIENT:  [name] Rice  56 y.o. female  PRE-OPERATIVE DIAGNOSIS:  Impingment Syndrome and lPartial Rotator Cuff Tear on the Right Shoulder  POST-OPERATIVE DIAGNOSIS:  Impingment Syndrome and lPartial Rotator Cuff Tear right shoulder  PROCEDURE:  Procedure(s): SHOLDER ARTHROSCOPY WITH OPEN ROTATOR CUFF REPAIR  SURGEON:  Surgeon(s): Javier Docker  PHYSICIAN ASSISTANT:   ASSISTANTS: Strader   ANESTHESIA:   general  EBL:  Total I/O In: 1000 [I.V.:1000] Out: -   BLOOD ADMINISTERED:none  DRAINS: none   LOCAL MEDICATIONS USED:  MARCAINE 20CC  SPECIMEN:  No Specimen  DISPOSITION OF SPECIMEN:   COUNTS:  YES  TOURNIQUET:  * No tourniquets in log *  DICTATION: Dictation [id number]  PLAN OF CARE: Admit for overnight observation  PATIENT DISPOSITION:  PACU - hemodynamically stable.   Delay start of Pharmacological VTE agent (>24hrs) due to surgical blood loss or risk of bleeding:  {YES/NO/NOT APPLICABLE:20182

## 2011-10-28 ENCOUNTER — Telehealth: Payer: Self-pay | Admitting: Dietician

## 2011-10-28 LAB — GLUCOSE, CAPILLARY: Glucose-Capillary: 169 mg/dL — ABNORMAL HIGH (ref 70–99)

## 2011-10-28 MED ORDER — HYDROCODONE-ACETAMINOPHEN 5-325 MG PO TABS: 1.0000 | ORAL_TABLET | ORAL | Status: DC | PRN

## 2011-10-28 MED ORDER — ONETOUCH DELICA LANCETS FINE MISC: 1.0000 | Freq: Four times a day (QID) | Status: DC

## 2011-10-28 NOTE — Progress Notes (Signed)
Subjective: 1 Day Post-Op Procedure(s) (LRB): SHOLDER ARTHROSCOPY WITH OPEN ROTATOR CUFF REPAIR (Right) Patient reports pain as 4 on 0-10 scale.   Denies CP or SOB.  Voiding without difficulty. Positive flatus. Objective: Vital signs in last 24 hours: Temp:  [98 F (36.7 C)-98.5 F (36.9 C)] 98 F (36.7 C) (01/11 0513) Pulse Rate:  [77-88] 77  (01/11 0513) Resp:  [10-20] 16  (01/11 0513) BP: (93-181)/(59-95) 120/76 mmHg (01/11 0513) SpO2:  [95 %-100 %] 96 % (01/11 0513) Weight:  [124.371 kg (274 lb 3 oz)] 124.371 kg (274 lb 3 oz) (01/10 1709)  Intake/Output from previous day: 01/10 0701 - 01/11 0700 In: 3460 [P.O.:360; I.V.:2950; IV Piggyback:150] Out: 1350 [Urine:1350] Intake/Output this shift:    No results found for this basename: HGB:5 in the last 72 hours No results found for this basename: WBC:2,RBC:2,HCT:2,PLT:2 in the last 72 hours No results found for this basename: NA:2,K:2,CL:2,CO2:2,BUN:2,CREATININE:2,GLUCOSE:2,CALCIUM:2 in the last 72 hours No results found for this basename: LABPT:2,INR:2 in the last 72 hours  Neurovascular intact Sensation intact distally Incision: dressing C/D/I Compartment soft  Assessment/Plan: 1 Day Post-Op Procedure(s) (LRB): SHOLDER ARTHROSCOPY WITH OPEN ROTATOR CUFF REPAIR (Right) D/c home after OT  [name] Rice R. 10/28/2011, 8:03 AM

## 2011-10-28 NOTE — Op Note (Signed)
[name] Rice                ACCOUNT NO.:  [id number]  MEDICAL RECORD NO.:  [id number]  LOCATION:  1614                         FACILITY:  [facility]  PHYSICIAN:  Jene Every, M.D.    DATE OF BIRTH:  01-18-1956  DATE OF PROCEDURE: DATE OF DISCHARGE:                              OPERATIVE REPORT   PREOPERATIVE DIAGNOSIS:  Impingement syndrome, rotator cuff tear, right shoulder.  POSTOPERATIVE DIAGNOSES:  Impingement syndrome, partial tear of the rotator cuff.  PROCEDURE PERFORMED:  Right shoulder arthroscopy followed by mini-open subacromial decompression acromioplasty.  ANESTHESIA:  General.  ASSISTANT:  Roma Schanz, PA.  Technical difficulty markedly increased by the patient's morbid obesity, BMI of 50.  HISTORY:  This is a 56 year old female who has had refractory shoulder pain, positive Neer sign secondary to impingement sign, following a vaccine.  She had subacromial corticosteroid injection to first release its persistent disabling symptomatology and was indicated for an MRI. She underwent an MRI, which indicated articular surface tear of the rotator cuff and type II acromial bursitis.  She was indicated for exam under anesthesia followed by a rotator cuff repair, possible subacromial decompression.  TECHNIQUE:  With the patient supine in beach chair position after induction of adequate general anesthesia, 2 g of Kefzol, the right shoulder and upper extremity was prepped and draped in usual sterile fashion.  After manipulation was performed under anesthesia, she essentially had near full range and lacked about 15 degrees of forward flexion and 10 degrees of abduction, both improved with gentle manipulation, grasping proximally on the humerus and stabilizing the scapulothoracic region.  On palpation, she has significant amount of subcutaneous adipose tissue around the acromion.  We used an acromial marker and then 18-gauge needle to delineate the posterior  portion of the acromion, the lateral portion of acromion, the anterior portion of the acromion, and the anterolateral.  We made incision through the skin posteriorly and anterolaterally and the region best fitted to utilize arthroscopic evaluation.  With the arm at the side, we advanced the arthroscopic cannula posteriorly, directed it towards the area where the 18-gauge needle was localized in the posterolateral corner.  This was felt with the leading edge of the arthroscopic cannula.  After multiple re-directions, we were able to advance into the subacromial space.  I made a small anterolateral incision as well and we used a cannula to triangulate in the subacromial space.  The cannula was not long enough, however, to enter the space and maintain a triangulation.  We were able to visualize the subacromial space once with the arthroscopic camera again maintaining the visualization as the portal cannula was suboptimal.  We were able to achieve the longer cannula to introduce laterally.  I extended incisions of the skin just to be able to digitally localize and assist the cannulas down into the subacromial space.  We had a second visualization of subacromial space again, but were unable to maintain it.  I therefore, felt that converting to the open procedure would be best.  The short visualization of the subacromial space, I did not see a full-thickness tear of the rotator cuff.  Made an incision over the anterior aspect of the acromion. Subcutaneous  tissue was dissected, electrocautery was utilized to achieve hemostasis.  She had a fairly abundant subcutaneous adipose tissue.  After locating the anterolateral aspect of the acromion digitally, I enlarged the incision anteriorly and posteriorly, identifying the anterior lateral corner of the acromion.  I identified the raphe and subperiosteally elevated the anterolateral and anteromedial aspect of the acromion with an AO elevator.   Detaching the CA ligament, I entered the subacromial space digitally and lysed adhesions in the subacromial space.  Hypertrophic bursa was noted, which was fairly deep.  With internal and external rotation and palpation, I did not see a significant rotator cuff tear.  Using a 3 mm Kerrison, I removed the anterolateral aspect of the acromion.  The deltoid tissue was suboptimal, fairly thin and significant thickness in the subcutaneous adipose tissue.  Following decompression, we had increased room in the subacromial space.  The bursa was excised.  Again, the tendon appeared to be intact.  Therefore, we repaired the raphe with #1 Vicryl in a figure-of-8 sutures, subcutaneous with multiple 2-0 Vicryl simple sutures.  The skin was re-approximated with 4-0 subcuticular Prolene.  We had ample space between the acromion incision and the portal incisions.  The division in the raphe was measured, it was no more than 3 cm over the anterolateral aspect of the acromion.  A sterile dressing was applied.  After subcuticular Prolene was utilized in subcutaneous tissue and she was placed in a sling, extubated without difficulty, and transported to the recovery room in satisfactory condition.  The patient tolerated the procedure well.  There were no complications.     Jene Every, M.D.     [name]  D:  10/27/2011  T:  10/28/2011  Job:  [id number]

## 2011-10-28 NOTE — Progress Notes (Signed)
Occupational Therapy Evaluation Patient Details Name: [name] Rice MRN: [id number] DOB: 27-May-1956 Today's Date: 10/28/2011  Problem List:  Patient Active Problem List  Diagnoses  . DIABETES MELLITUS, TYPE II  . DYSLIPIDEMIA  . ANXIETY  . DEPRESSION  . HYPERTENSION  . ASTHMA  . COPD  . GERD  . BACK PAIN, LUMBAR, WITH RADICULOPATHY  . BPPV (benign paroxysmal positional vertigo)  . OSA on CPAP  . Calf pain  . Tremor, essential  . Insomnia  . Seborrheic dermatitis, unspecified  . Shoulder pain, right  . Preventative health care  . Acute rhinosinusitis    Past Medical History:  Past Medical History  Diagnosis Date  . Anxiety   . Arthritis   . Asthma   . COPD (chronic obstructive pulmonary disease)   . Depression   . Diabetes mellitus   . GERD (gastroesophageal reflux disease)   . Hypertension   . HLD (hyperlipidemia)   . OSA on CPAP 2008  . Dizziness   . History of colonic polyps   . Lumbar pain 2011    per x-ray - Multilevel spondylosis  . Cervical dystonia   . Tremor CCENTRAL NERVOUS SYSTEM  . Torn rotator cuff RT SHOULDER  . Dermatitis   . Diarrhea   . History of kidney stones   . Sleep apnea     USES C-PAP  . Recurrent boils     "SLUSTER BOILS" ON ABD AND LABIA   Past Surgical History:  Past Surgical History  Procedure Date  . Pilonidal cyst excision 2536,6440  . Tubal ligation   . Lumbar laminectomy   . Carpal tunnel release 2006    left  . Elbow surgery LEFT  . Knee arthroscopy LEFT  . Nasal sinus surgery   . Hand surgery RIGHT    OT Assessment/Plan/Recommendation OT Assessment Clinical Impression Statement: 56 y o female s/p shoulder arthroscopy/open rotator cuff repair currently needs min A  for ADLs and lives alone.  Would benefit from skilled OT to increase independence with ADLs.  Discussed modifications in the meantime.  She will benefit from skilled OT OT Recommendation/Assessment: Patient will need skilled OT in the acute care  venue Barriers to Discharge: Decreased caregiver support;Other (comment) (neighbor can help with dressing change) OT Plan OT Frequency: Min 2X/week OT Treatment/Interventions: Self-care/ADL training;Therapeutic exercise;Balance training;Patient/family education OT Recommendation Recommendations for Other Services: PT consult Follow Up Recommendations: Home health OT;Other (comment) (home health aide) Equipment Recommended: 3 in 1 bedside comode;Other (comment) (will see if a standard or wide is needed) OT Goals Acute Rehab OT Goals OT Goal Formulation: With patient Time For Goal Achievement: Other (comment) (see shadow chart for goals) ADL Goals Pt Will Perform Upper Body Bathing: with supervision;Sitting, chair;Supported ADL Goal: Product manager - Progress: Not met Pt Will Perform Upper Body Dressing: with supervision;Sitting, chair;Supported ADL Goal: Location manager Dressing - Progress: Not met Pt Will Transfer to Toilet: with supervision;3-in-1;Ambulation ADL Goal: Toilet Transfer - Progress: Not met  OT Evaluation Precautions/Restrictions  Precautions Precaution Comments: shoulder:  sling for comfort; can move arm to tolerance, per PA and OK to use RW Prior Functioning Home Living Lives With: Alone Bathroom Shower/Tub: Tub/shower unit;Other (comment) (tub bench) Bathroom Toilet: Standard (grab bar on right) Prior Function Level of Independence: Independent with basic ADLs;Other (comment) (with AE) ADL ADL ADL Comments: see shoulder eval in shadow chart.  Pt needs min A for ADLs Vision/Perception    Cognition   Sensation/Coordination   Extremity Assessment   Mobility  Exercises   End of Session OT - End of Session Equipment Utilized During Treatment: Other (comment) (sling) Patient left: in chair;with call bell in reach;with family/visitor present Nurse Communication: Other (comment) (D/C concerns:  also spoke to [place], [place]) General Behavior During  Session: [facility] for tasks performed Cognition: [facility] for tasks performed   Astra Gregg [phone] 10/28/2011, 12:21 PM

## 2011-10-28 NOTE — Progress Notes (Signed)
Physical Therapy Evaluation Patient Details Name: ADREANA COULL MRN: [id number] DOB: 03-28-1956 Today's Date: 10/28/2011 1126 - 1152; EVAL Problem List:  Patient Active Problem List  Diagnoses  . DIABETES MELLITUS, TYPE II  . DYSLIPIDEMIA  . ANXIETY  . DEPRESSION  . HYPERTENSION  . ASTHMA  . COPD  . GERD  . BACK PAIN, LUMBAR, WITH RADICULOPATHY  . BPPV (benign paroxysmal positional vertigo)  . OSA on CPAP  . Calf pain  . Tremor, essential  . Insomnia  . Seborrheic dermatitis, unspecified  . Shoulder pain, right  . Preventative health care  . Acute rhinosinusitis    Past Medical History:  Past Medical History  Diagnosis Date  . Anxiety   . Arthritis   . Asthma   . COPD (chronic obstructive pulmonary disease)   . Depression   . Diabetes mellitus   . GERD (gastroesophageal reflux disease)   . Hypertension   . HLD (hyperlipidemia)   . OSA on CPAP 2008  . Dizziness   . History of colonic polyps   . Lumbar pain 2011    per x-ray - Multilevel spondylosis  . Cervical dystonia   . Tremor CCENTRAL NERVOUS SYSTEM  . Torn rotator cuff RT SHOULDER  . Dermatitis   . Diarrhea   . History of kidney stones   . Sleep apnea     USES C-PAP  . Recurrent boils     "SLUSTER BOILS" ON ABD AND LABIA   Past Surgical History:  Past Surgical History  Procedure Date  . Pilonidal cyst excision 5621,3086  . Tubal ligation   . Lumbar laminectomy   . Carpal tunnel release 2006    left  . Elbow surgery LEFT  . Knee arthroscopy LEFT  . Nasal sinus surgery   . Hand surgery RIGHT    PT Assessment/Plan/Recommendation PT Assessment Clinical Impression Statement: Pt with R shoulder surgery and previous balance issues presents with decreased functional mobility and instability with gait PT Recommendation/Assessment: Patient will need skilled PT in the acute care venue PT Problem List: Decreased activity tolerance;Decreased mobility;Decreased balance;Decreased knowledge of use of  DME;Pain PT Therapy Diagnosis : Difficulty walking PT Plan PT Frequency: Min 5X/week PT Treatment/Interventions: DME instruction;Gait training;Functional mobility training;Balance training;Therapeutic activities PT Recommendation Recommendations for Other Services: OT consult Follow Up Recommendations: Home health PT Equipment Recommended: 3 in 1 bedside comode;Other (comment) (will see if a standard or wide is needed) PT Goals  Acute Rehab PT Goals PT Goal Formulation: With patient Time For Goal Achievement: 5 days Pt will go Supine/Side to Sit: with modified independence PT Goal: Supine/Side to Sit - Progress: Not met Pt will go Sit to Supine/Side: with modified independence PT Goal: Sit to Supine/Side - Progress: Not met Pt will go Sit to Stand: with modified independence PT Goal: Sit to Stand - Progress: Progressing toward goal Pt will go Stand to Sit: with modified independence PT Goal: Stand to Sit - Progress: Progressing toward goal Pt will Ambulate: 51 - 150 feet;with modified independence;with rolling walker PT Goal: Ambulate - Progress: Progressing toward goal  PT Evaluation Precautions/Restrictions  Precautions Precaution Comments: shoulder:  sling for comfort; can move arm to tolerance, per PA and OK to use RW Prior Functioning  Home Living Lives With: Alone Receives Help From: Family (LTd assist from Dtr) Type of Home: Apartment Home Layout: One level Home Access: Level entry Bathroom Shower/Tub: Tub/shower unit;Other (comment) (tub bench) Bathroom Toilet: Standard (grab bar on right) Prior Function Level of Independence: Independent with  basic ADLs;Other (comment) (with AE) Cognition Cognition Arousal/Alertness: Awake/alert Overall Cognitive Status: Appears within functional limits for tasks assessed Orientation Level: Oriented X4 Sensation/Coordination Sensation Additional Comments: pt with hx of peripheral neuropathy; reports numb toes, burning feet at  night and proprioceptive difficulties when ambulating - states improves with wearing of shoes Extremity Assessment RUE Assessment RUE Assessment: Exceptions to [facility] (functional for use with RW; refer to OT assessment) LUE Assessment LUE Assessment: Not tested RLE Assessment RLE Assessment: Within Functional Limits LLE Assessment LLE Assessment: Within Functional Limits Mobility (including Balance) Bed Mobility Bed Mobility:  (OOB with OT) Transfers Transfers: Yes Sit to Stand: 5: Supervision;4: Min assist;With armrests;From chair/3-in-1 Sit to Stand Details (indicate cue type and reason): cues for UE use Stand to Sit: 5: Supervision Stand to Sit Details: cues for use of Ues Ambulation/Gait Ambulation/Gait: Yes Ambulation/Gait Assistance: 4: Min assist;5: Supervision Ambulation/Gait Assistance Details (indicate cue type and reason): 100' with rollator and min assist/CGA; 100' with RW and CGA/Supervision.  Pt reports typically used rollator at home but feels more stable with RW.  Pt has access to both Ambulation Distance (Feet): 200 Feet Assistive device: Rolling walker;Rollator Gait Pattern: Step-to pattern;Step-through pattern Gait velocity: slow    Exercise    End of Session PT - End of Session Activity Tolerance: Patient tolerated treatment well Patient left: in chair;with call bell in reach Nurse Communication: Mobility status for transfers;Mobility status for ambulation General Behavior During Session: [facility] for tasks performed Cognition: [facility] for tasks performed  Gavyn Zoss 10/28/2011, 1:00 PM

## 2011-10-28 NOTE — Telephone Encounter (Signed)
Pt calLed requesting one touch delica RX be sent to [facility] on [address]

## 2011-10-28 NOTE — Progress Notes (Signed)
Occupational Therapy Evaluation Patient Details Name: [name] Rice MRN: [id number] DOB: 1956/07/13 Today's Date: 10/28/2011 1228 1240 [place]  Problem List:  Patient Active Problem List  Diagnoses  . DIABETES MELLITUS, TYPE II  . DYSLIPIDEMIA  . ANXIETY  . DEPRESSION  . HYPERTENSION  . ASTHMA  . COPD  . GERD  . BACK PAIN, LUMBAR, WITH RADICULOPATHY  . BPPV (benign paroxysmal positional vertigo)  . OSA on CPAP  . Calf pain  . Tremor, essential  . Insomnia  . Seborrheic dermatitis, unspecified  . Shoulder pain, right  . Preventative health care  . Acute rhinosinusitis    Past Medical History:  Past Medical History  Diagnosis Date  . Anxiety   . Arthritis   . Asthma   . COPD (chronic obstructive pulmonary disease)   . Depression   . Diabetes mellitus   . GERD (gastroesophageal reflux disease)   . Hypertension   . HLD (hyperlipidemia)   . OSA on CPAP 2008  . Dizziness   . History of colonic polyps   . Lumbar pain 2011    per x-ray - Multilevel spondylosis  . Cervical dystonia   . Tremor CCENTRAL NERVOUS SYSTEM  . Torn rotator cuff RT SHOULDER  . Dermatitis   . Diarrhea   . History of kidney stones   . Sleep apnea     USES C-PAP  . Recurrent boils     "SLUSTER BOILS" ON ABD AND LABIA   Past Surgical History:  Past Surgical History  Procedure Date  . Pilonidal cyst excision 4098,1191  . Tubal ligation   . Lumbar laminectomy   . Carpal tunnel release 2006    left  . Elbow surgery LEFT  . Knee arthroscopy LEFT  . Nasal sinus surgery   . Hand surgery RIGHT    OT Assessment/Plan/Recommendation OT Assessment Clinical Impression Statement: 56 y o female s/p shoulder arthroscopy/open rotator cuff repair currently needs min A  for ADLs and lives alone.  Would benefit from skilled OT to increase independence with ADLs.  Discussed modifications in the meantime.  She will benefit from skilled OT OT Recommendation/Assessment: Patient will need skilled OT in the acute  care venue Barriers to Discharge: Decreased caregiver support;Other (comment) (neighbor can help with dressing change) OT Plan OT Frequency: Min 2X/week OT Treatment/Interventions: Self-care/ADL training;Therapeutic exercise;Balance training;Patient/family education OT Recommendation Recommendations for Other Services: PT consult Follow Up Recommendations: Home health OT;Other (comment) (HHOT/aide & HHPT) Equipment Recommended: 3 in 1 bedside comode;Other (comment) (standard) OT Goals Acute Rehab OT Goals OT Goal Formulation: With patient Time For Goal Achievement: Other (comment) (see shadow chart for goals) ADL Goals Pt Will Perform Upper Body Bathing: with supervision;Sitting, chair;Supported ADL Goal: Product manager - Progress: Not addressed Pt Will Perform Upper Body Dressing: with supervision;Sitting, chair;Supported ADL Goal: Location manager Dressing - Progress: Not addressed Pt Will Transfer to Toilet: with supervision;3-in-1;Ambulation ADL Goal: Toilet Transfer - Progress: Progressing toward goals  OT Evaluation Precautions/Restrictions  Precautions Precaution Comments: shoulder:  sling for comfort; can move arm to tolerance, per PA and OK to use RW Required Braces or Orthoses:  (sling for comfort) Prior Functioning Home Living Lives With: Alone Receives Help From: Family (LTd assist from Dtr) Type of Home: Apartment Home Layout: One level Home Access: Level entry Bathroom Shower/Tub: Tub/shower unit;Other (comment) (tub bench) Bathroom Toilet: Standard (grab bar on right) Prior Function Level of Independence: Independent with basic ADLs;Other (comment) (with AE) ADL ADL Toilet Transfer: Minimal assistance;Other (comment) (min guard  with RW; no LOB) Toilet Transfer Details (indicate cue type and reason): min cues to use LUE to push up from surface Toilet Transfer Method: Ambulating ADL Comments: Pt has toilet aid here.  Needed A to reach toilet paper secondary to  bathroom set up.  Standard 3:1 fits pt.   Vision/Perception    Cognition Cognition Arousal/Alertness: Awake/alert Overall Cognitive Status: Appears within functional limits for tasks assessed Orientation Level: Oriented X4 Sensation/Coordination Extremity Assessment RUE Assessment RUE Assessment: Exceptions to [facility] (functional for use with RW; refer to OT assessment) LUE Assessment LUE Assessment: Not tested Mobility  Transfers Sit to Stand: 5: Supervision Sit to Stand Details (indicate cue type and reason): min cues for UE placement Stand to Sit: 5: Supervision Stand to Sit Details: cues for use of Ues Exercises   End of Session OT - End of Session Equipment Utilized During Treatment: Other (comment) (sling) Patient left: in chair;with call bell in reach;with family/visitor present Nurse Communication:  (followed up with [name] re:  HH recommendations and 3:1) General Behavior During Session: [facility] for tasks performed Cognition: [facility] for tasks performed   Heydy Montilla [phone] 10/28/2011, 1:29 PM

## 2011-10-28 NOTE — Clinical Documentation Improvement (Signed)
BMI DOCUMENTATION CLARIFICATION QUERY  THIS DOCUMENT IS NOT A PERMANENT PART OF THE MEDICAL RECORD  TO RESPOND TO THE THIS QUERY, FOLLOW THE INSTRUCTIONS BELOW:  1. If needed, update documentation for the patient's encounter via the notes activity.  2. Access this query again and click edit on the In [employer].  3. After updating, or not, click F2 to complete all highlighted (required) fields concerning your review. Select "additional documentation in the medical record" OR "no additional documentation provided".  4. Click Sign note button.  5. The deficiency will fall out of your In Basket *Please let us know if you are not able to complete this workflow by phone or e-mail (listed below).         10/28/11  Dear Dr. Shelle Iron, Shela Commons  Marton Redwood  In an effort to better capture your patient's severity of illness, reflect appropriate length of stay and utilization of resources, a review of the patient medical record has revealed the following indicators.    Based on your clinical judgment, please clarify and document in a progress note and/or discharge summary the clinical condition associated with the following supporting information:  In responding to this query please exercise your independent judgment.  The fact that a query is asked, does not imply that any particular answer is desired or expected.   Pt's BMI= 48.7. Please clarify whether or not BMI can be linked to one of he diagnoses listed below and document in pn  and d/c. Thank You!  [employer]ACT[employer]: When linking BMI to a diagnosis please document both BMI and diagnosis together in pn for accuracy of SOI and ROM.    Possible Clinical conditions  Morbid Obesity W/ BMI=   Underweight w/BMI=  Other condition___________________  Cannot Clinically determine _____________  Risk Factors: Sign & Symptoms: BMI=48.7 5'3"/274lbs  Treatment monitoring   Reviewed:  no additional documentation provided ljh   Thank  You,  Enis Slipper  RN, BSN, CCDS Clinical Documentation Specialist [facility] Dept Pager: [phone] / E-mail: [email or web address]  Health Information Management [place]

## 2011-10-28 NOTE — Progress Notes (Signed)
  CARE MANAGEMENT NOTE 10/28/2011  Patient:  [name] Rice, [name] Rice   Account Number:  [id number]  Date Initiated:  10/28/2011  Documentation initiated by:  Colleen Can  Subjective/Objective Assessment:   DX IMPRINGEMENT SYNDROME RIGHT SHOULDER, PARTIAL ROTATOR CUFF REPAIR; ARTHROSCOPY AND OPEN ROTATOR CUFF REPAIR     Action/Plan:   PT HAS BEEN DISCHARGED TO HOME PRIOR TO BEING SEEN BY CM. SHE HAD BEEN REFERRED TO [facility] BY MD OFFICE PRIOR TO ADMISSION IF HH NEEDS   Anticipated DC Date:  10/28/2011   Anticipated DC Plan:  HOME W HOME HEALTH SERVICES      DC Planning Services  CM consult      Choice offered to / List presented to:     DME arranged  NA      DME agency  NA     HH arranged  HH-2 PT  HH-3 OT  HH-4 NURSE'S AIDE      HH agency  [facility]   Status of service:  Completed, signed off Medicare Important Message given?   (If response is "NO", the following Medicare IM given date fields will be blank) Date Medicare IM given:   Date Additional Medicare IM given:    Discharge Disposition:  HOME W HOME HEALTH SERVICES  Per UR Regulation:    Comments:  10/28/2011 Raynelle Bring BSN CCM [phone] THERE ARE ORDERS FOR hh SERVICES; Genevieve Norlander NOTIFIED, WILL START SERVICES TOMORROW

## 2011-10-29 DIAGNOSIS — J449 Chronic obstructive pulmonary disease, unspecified: Secondary | ICD-10-CM | POA: Diagnosis not present

## 2011-10-29 DIAGNOSIS — G252 Other specified forms of tremor: Secondary | ICD-10-CM | POA: Diagnosis not present

## 2011-10-29 DIAGNOSIS — G250 Essential tremor: Secondary | ICD-10-CM | POA: Diagnosis not present

## 2011-10-29 DIAGNOSIS — F3289 Other specified depressive episodes: Secondary | ICD-10-CM | POA: Diagnosis not present

## 2011-10-29 DIAGNOSIS — F329 Major depressive disorder, single episode, unspecified: Secondary | ICD-10-CM | POA: Diagnosis not present

## 2011-10-29 DIAGNOSIS — Z4889 Encounter for other specified surgical aftercare: Secondary | ICD-10-CM | POA: Diagnosis not present

## 2011-10-29 DIAGNOSIS — M25819 Other specified joint disorders, unspecified shoulder: Secondary | ICD-10-CM | POA: Diagnosis not present

## 2011-10-29 DIAGNOSIS — E119 Type 2 diabetes mellitus without complications: Secondary | ICD-10-CM | POA: Diagnosis not present

## 2011-10-31 DIAGNOSIS — E119 Type 2 diabetes mellitus without complications: Secondary | ICD-10-CM | POA: Diagnosis not present

## 2011-10-31 DIAGNOSIS — Z4889 Encounter for other specified surgical aftercare: Secondary | ICD-10-CM | POA: Diagnosis not present

## 2011-10-31 DIAGNOSIS — G250 Essential tremor: Secondary | ICD-10-CM | POA: Diagnosis not present

## 2011-10-31 DIAGNOSIS — F329 Major depressive disorder, single episode, unspecified: Secondary | ICD-10-CM | POA: Diagnosis not present

## 2011-10-31 DIAGNOSIS — F3289 Other specified depressive episodes: Secondary | ICD-10-CM | POA: Diagnosis not present

## 2011-10-31 DIAGNOSIS — J449 Chronic obstructive pulmonary disease, unspecified: Secondary | ICD-10-CM | POA: Diagnosis not present

## 2011-10-31 DIAGNOSIS — G252 Other specified forms of tremor: Secondary | ICD-10-CM | POA: Diagnosis not present

## 2011-10-31 NOTE — Discharge Summary (Signed)
Physician Discharge Summary  Patient ID: [name] Rice MRN: [id number] DOB/AGE: Jan 12, 1956 56 y.o.  Admit date: 10/27/2011 Discharge date: 10/31/2011  Admission Diagnoses: Right shoulder impingement Anxiety   Arthritis   Asthma   COPD (chronic obstructive pulmonary disease)   Depression   Diabetes mellitus   GERD (gastroesophageal reflux disease)   Hypertension  HLD (hyperlipidemia)   OSA on CPAP 2008   Dizziness   History of colonic polyps   Lumbar pain 2011  per x-ray - Multilevel spondylosis   Cervical dystonia  Tremor  CCENTRAL NERVOUS SYSTEM   Torn rotator cuff  RT SHOULDER   Dermatitis   Diarrhea   History of kidney stones   Sleep apnea  USES C-PAP   Recurrent boils  "SLUSTER BOILS" ON ABD AND LABIA   Discharge Diagnoses:  Right shoulder impingement s/p open debridement attempted arthroscopy Anxiety   Arthritis   Asthma   COPD (chronic obstructive pulmonary disease)   Depression   Diabetes mellitus   GERD (gastroesophageal reflux disease)   Hypertension  HLD (hyperlipidemia)   OSA on CPAP 2008   Dizziness   History of colonic polyps   Lumbar pain 2011  per x-ray - Multilevel spondylosis   Cervical dystonia  Tremor  CCENTRAL NERVOUS SYSTEM   Torn rotator cuff  RT SHOULDER   Dermatitis   Diarrhea   History of kidney stones   Sleep apnea  USES C-PAP   Recurrent boils  "SLUSTER BOILS" ON ABD AND LABIA  Discharged Condition: good  Hospital Course: uneventful  Procedure Note: Right shoulder arthroscopy followed by mini-open  subacromial decompression acromioplasty.   Consults: PT/OT   Disposition: Home or Self Care  Discharge Orders    Future Appointments: Provider: Department: Dept Phone: Center:   11/18/2011 2:15 PM Elyse Jarvis [facility] [phone] [facility]     Future Orders Please Complete By Expires   Diet - low sodium heart healthy      Call MD / Call 911      Comments:   If you experience chest pain or shortness of breath,  CALL 911 and be transported to the hospital emergency room.  If you develope a fever above 101 F, pus (white drainage) or increased drainage or redness at the wound, or calf pain, call your surgeon's office.   Constipation Prevention      Comments:   Drink plenty of fluids.  Prune juice may be helpful.  You may use a stool softener, such as Colace (over the counter) 100 mg twice a day.  Use MiraLax (over the counter) for constipation as needed.   Increase activity slowly as tolerated      Weight Bearing as taught in Physical Therapy      Comments:   Use a walker or crutches as instructed.   Discharge instructions      Comments:   Shoulder Arthroscopy  Ice pack to shoulder 3-4 times a day Ok to shower in 72 hours, change dressing daily May get out of sling in AM and start gentle pendulum exercises.  You are encouraged to move the elbow,hand, and wrist.   Avoid overhead lifting Elevate shoulder and elbow on pillow several times a day.     Driving restrictions      Comments:   No driving for 1-2 weeks     Discharge Medication List as of 10/28/2011 12:41 PM    CONTINUE these medications which have CHANGED   Details  !! HYDROcodone-acetaminophen (NORCO) 5-325 MG per tablet  Take 1-2 tablets by mouth every 4 (four) hours as needed., Starting 10/28/2011, Until Mon 11/07/11, Print     !! - Potential duplicate medications found. Please discuss with provider.    CONTINUE these medications which have NOT CHANGED   Details  ADVAIR DISKUS 250-50 MCG/DOSE AEPB INHALE 1 PUFF BY MOUTH TWICE DAILY, Normal    allopurinol (ZYLOPRIM) 300 MG tablet Take 1 tablet (300 mg total) by mouth daily., Starting 01/07/2011, Until Discontinued, Normal    !! CYMBALTA 20 MG capsule TAKE 1 CAPSULE BY MOUTH EVERY DAY WITH 60MG  CAPSULES, Normal    !! CYMBALTA 60 MG capsule TAKE 1 CAPUSLE BY MOUTH DAILY, Normal    furosemide (LASIX) 40 MG tablet Take 40 mg by mouth every morning.  , Starting 01/07/2011, Until  Discontinued, Historical Med    !! HYDROcodone-acetaminophen (NORCO) 5-325 MG per tablet Take 1 tablet by mouth every 6 (six) hours as needed for pain., Starting 10/24/2011, Until Discontinued, Phone In    ibuprofen (ADVIL,MOTRIN) 200 MG tablet Take 400 mg by mouth every 6 (six) hours as needed. Pain  , Until Discontinued, Historical Med    insulin aspart (NOVOLOG) 100 UNIT/ML injection Inject 45-50 Units into the skin 3 (three) times daily before meals. Inject 50 units with first meal, 45 units with second,  45 units with third meal. adding correction amount to each as needed up to a total of 150 units a day, Starting 08/23/2011, Until  Discontinued, Historical Med    insulin glargine (LANTUS) 100 UNIT/ML injection Inject 120 Units into the skin daily with breakfast. , Starting 08/29/2011, Until Discontinued, Historical Med    LYRICA 100 MG capsule TAKE 1 CAPSULE BY MOUTH TWICE DAILY, Print    metFORMIN (GLUCOPHAGE) 1000 MG tablet Take 1 tablet (1,000 mg total) by mouth 2 (two) times daily with a meal., Starting 01/07/2011, Until Discontinued, Normal    methocarbamol (ROBAXIN) 500 MG tablet Take 500 mg by mouth 2 (two) times daily. , Until Discontinued, Historical Med    mometasone (NASONEX) 50 MCG/ACT nasal spray Place 2 sprays into the nose daily. , Until Discontinued, Historical Med    olopatadine (PATANOL) 0.1 % ophthalmic solution Place 1 drop into both eyes 2 (two) times daily as needed. Itching eyes , Until Discontinued, Historical Med    pantoprazole (PROTONIX) 40 MG tablet TAKE 1 TABLET BY MOUTH DAILY, Normal    pravastatin (PRAVACHOL) 40 MG tablet Take 40 mg by mouth every morning. , Starting 08/10/2011, Until Discontinued, Historical Med    propranolol (INDERAL) 20 MG tablet Take 1 tablet (20 mg total) by mouth 2 (two) times daily., Starting 01/28/2011, Until Sat 01/28/12, Normal    triamcinolone cream (KENALOG) 0.5 % Apply 1 application topically 2 (two) times daily as needed.  Itching  , Starting 12/21/2010, Until Wed 12/21/11, Historical Med    WAL-ITIN D 5-120 MG per tablet TAKE 1 TABLET BY MOUTH TWICE DAILY, Normal    zolpidem (AMBIEN) 10 MG tablet TAKE ONE TABLET BY MOUTH AT BEDTIME AS NEEDED FOR SLEEP, Print    albuterol (VENTOLIN HFA) 108 (90 BASE) MCG/ACT inhaler Inhale 2 puffs into the lungs every 6 (six) hours as needed. Wheezing , Until Discontinued, Historical Med    B-D INS SYR ULTRAFINE 1CC/30G 30G X 1/2" 1 ML MISC CHECK BLOOD GLUCOSE WITH EVERY MEAL AND BEFORE BEDTIME, Normal    !! DULoxetine (CYMBALTA) 60 MG capsule  , Starting 09/12/2011, Until Discontinued, Historical Med    glucose blood (ONE TOUCH ULTRA TEST)  test strip Use as instructed, Normal    Insulin Pen Needle (B-D UF III MINI PEN NEEDLES) 31G X 5 MM MISC 1 each by Does not apply route 3 (three) times daily., Starting 11/26/2010, Until Discontinued, Normal    meclizine (ANTIVERT) 25 MG tablet TAKE 1 TABLET BY MOUTH THREE TIMES DAILY FOR DIZZINESS OR NAUSEA, Normal    SINGULAIR 10 MG tablet TAKE 1 TABLET BY MOUTH ONCE DAILY, Normal    Syringe/Needle, Disp, (B-D ECLIPSE SYRINGE) 30G X 1/2" 1 ML MISC 1 Syringe by Does not apply route 3 (three) times daily.  , Until Discontinued, Historical Med     !! - Potential duplicate medications found. Please discuss with provider.     Follow-up Information    Follow up with BEANE,JEFFREY C in 10 days.   Contact information:   [facility] [address] [place] [address] [phone]          Signed: Liam Graham. 10/31/2011, 8:27 AM

## 2011-11-01 DIAGNOSIS — E119 Type 2 diabetes mellitus without complications: Secondary | ICD-10-CM | POA: Diagnosis not present

## 2011-11-01 DIAGNOSIS — J449 Chronic obstructive pulmonary disease, unspecified: Secondary | ICD-10-CM | POA: Diagnosis not present

## 2011-11-01 DIAGNOSIS — F3289 Other specified depressive episodes: Secondary | ICD-10-CM | POA: Diagnosis not present

## 2011-11-01 DIAGNOSIS — F329 Major depressive disorder, single episode, unspecified: Secondary | ICD-10-CM | POA: Diagnosis not present

## 2011-11-01 DIAGNOSIS — Z4889 Encounter for other specified surgical aftercare: Secondary | ICD-10-CM | POA: Diagnosis not present

## 2011-11-01 DIAGNOSIS — G250 Essential tremor: Secondary | ICD-10-CM | POA: Diagnosis not present

## 2011-11-02 DIAGNOSIS — G252 Other specified forms of tremor: Secondary | ICD-10-CM | POA: Diagnosis not present

## 2011-11-02 DIAGNOSIS — F3289 Other specified depressive episodes: Secondary | ICD-10-CM | POA: Diagnosis not present

## 2011-11-02 DIAGNOSIS — E119 Type 2 diabetes mellitus without complications: Secondary | ICD-10-CM | POA: Diagnosis not present

## 2011-11-02 DIAGNOSIS — F329 Major depressive disorder, single episode, unspecified: Secondary | ICD-10-CM | POA: Diagnosis not present

## 2011-11-02 DIAGNOSIS — G250 Essential tremor: Secondary | ICD-10-CM | POA: Diagnosis not present

## 2011-11-02 DIAGNOSIS — Z4889 Encounter for other specified surgical aftercare: Secondary | ICD-10-CM | POA: Diagnosis not present

## 2011-11-02 DIAGNOSIS — J449 Chronic obstructive pulmonary disease, unspecified: Secondary | ICD-10-CM | POA: Diagnosis not present

## 2011-11-03 DIAGNOSIS — Z4889 Encounter for other specified surgical aftercare: Secondary | ICD-10-CM | POA: Diagnosis not present

## 2011-11-03 DIAGNOSIS — F329 Major depressive disorder, single episode, unspecified: Secondary | ICD-10-CM | POA: Diagnosis not present

## 2011-11-03 DIAGNOSIS — G250 Essential tremor: Secondary | ICD-10-CM | POA: Diagnosis not present

## 2011-11-03 DIAGNOSIS — F3289 Other specified depressive episodes: Secondary | ICD-10-CM | POA: Diagnosis not present

## 2011-11-03 DIAGNOSIS — E119 Type 2 diabetes mellitus without complications: Secondary | ICD-10-CM | POA: Diagnosis not present

## 2011-11-03 DIAGNOSIS — J449 Chronic obstructive pulmonary disease, unspecified: Secondary | ICD-10-CM | POA: Diagnosis not present

## 2011-11-04 DIAGNOSIS — Z4889 Encounter for other specified surgical aftercare: Secondary | ICD-10-CM | POA: Diagnosis not present

## 2011-11-04 DIAGNOSIS — F3289 Other specified depressive episodes: Secondary | ICD-10-CM | POA: Diagnosis not present

## 2011-11-04 DIAGNOSIS — J449 Chronic obstructive pulmonary disease, unspecified: Secondary | ICD-10-CM | POA: Diagnosis not present

## 2011-11-04 DIAGNOSIS — G252 Other specified forms of tremor: Secondary | ICD-10-CM | POA: Diagnosis not present

## 2011-11-04 DIAGNOSIS — G250 Essential tremor: Secondary | ICD-10-CM | POA: Diagnosis not present

## 2011-11-04 DIAGNOSIS — E119 Type 2 diabetes mellitus without complications: Secondary | ICD-10-CM | POA: Diagnosis not present

## 2011-11-04 DIAGNOSIS — F329 Major depressive disorder, single episode, unspecified: Secondary | ICD-10-CM | POA: Diagnosis not present

## 2011-11-05 ENCOUNTER — Other Ambulatory Visit: Payer: Self-pay | Admitting: Internal Medicine

## 2011-11-07 DIAGNOSIS — Z4889 Encounter for other specified surgical aftercare: Secondary | ICD-10-CM | POA: Diagnosis not present

## 2011-11-07 DIAGNOSIS — E119 Type 2 diabetes mellitus without complications: Secondary | ICD-10-CM | POA: Diagnosis not present

## 2011-11-07 DIAGNOSIS — G252 Other specified forms of tremor: Secondary | ICD-10-CM | POA: Diagnosis not present

## 2011-11-07 DIAGNOSIS — J449 Chronic obstructive pulmonary disease, unspecified: Secondary | ICD-10-CM | POA: Diagnosis not present

## 2011-11-07 DIAGNOSIS — G250 Essential tremor: Secondary | ICD-10-CM | POA: Diagnosis not present

## 2011-11-07 DIAGNOSIS — F329 Major depressive disorder, single episode, unspecified: Secondary | ICD-10-CM | POA: Diagnosis not present

## 2011-11-07 DIAGNOSIS — F3289 Other specified depressive episodes: Secondary | ICD-10-CM | POA: Diagnosis not present

## 2011-11-08 DIAGNOSIS — F329 Major depressive disorder, single episode, unspecified: Secondary | ICD-10-CM | POA: Diagnosis not present

## 2011-11-08 DIAGNOSIS — Z4889 Encounter for other specified surgical aftercare: Secondary | ICD-10-CM | POA: Diagnosis not present

## 2011-11-08 DIAGNOSIS — F3289 Other specified depressive episodes: Secondary | ICD-10-CM | POA: Diagnosis not present

## 2011-11-08 DIAGNOSIS — J449 Chronic obstructive pulmonary disease, unspecified: Secondary | ICD-10-CM | POA: Diagnosis not present

## 2011-11-08 DIAGNOSIS — E119 Type 2 diabetes mellitus without complications: Secondary | ICD-10-CM | POA: Diagnosis not present

## 2011-11-08 DIAGNOSIS — G250 Essential tremor: Secondary | ICD-10-CM | POA: Diagnosis not present

## 2011-11-10 DIAGNOSIS — J449 Chronic obstructive pulmonary disease, unspecified: Secondary | ICD-10-CM | POA: Diagnosis not present

## 2011-11-10 DIAGNOSIS — F329 Major depressive disorder, single episode, unspecified: Secondary | ICD-10-CM | POA: Diagnosis not present

## 2011-11-10 DIAGNOSIS — E119 Type 2 diabetes mellitus without complications: Secondary | ICD-10-CM | POA: Diagnosis not present

## 2011-11-10 DIAGNOSIS — F3289 Other specified depressive episodes: Secondary | ICD-10-CM | POA: Diagnosis not present

## 2011-11-10 DIAGNOSIS — Z4889 Encounter for other specified surgical aftercare: Secondary | ICD-10-CM | POA: Diagnosis not present

## 2011-11-10 DIAGNOSIS — G250 Essential tremor: Secondary | ICD-10-CM | POA: Diagnosis not present

## 2011-11-14 DIAGNOSIS — Z4889 Encounter for other specified surgical aftercare: Secondary | ICD-10-CM | POA: Diagnosis not present

## 2011-11-14 DIAGNOSIS — F3289 Other specified depressive episodes: Secondary | ICD-10-CM | POA: Diagnosis not present

## 2011-11-14 DIAGNOSIS — G250 Essential tremor: Secondary | ICD-10-CM | POA: Diagnosis not present

## 2011-11-14 DIAGNOSIS — E119 Type 2 diabetes mellitus without complications: Secondary | ICD-10-CM | POA: Diagnosis not present

## 2011-11-14 DIAGNOSIS — G252 Other specified forms of tremor: Secondary | ICD-10-CM | POA: Diagnosis not present

## 2011-11-14 DIAGNOSIS — J449 Chronic obstructive pulmonary disease, unspecified: Secondary | ICD-10-CM | POA: Diagnosis not present

## 2011-11-14 DIAGNOSIS — F329 Major depressive disorder, single episode, unspecified: Secondary | ICD-10-CM | POA: Diagnosis not present

## 2011-11-16 DIAGNOSIS — G250 Essential tremor: Secondary | ICD-10-CM | POA: Diagnosis not present

## 2011-11-16 DIAGNOSIS — F329 Major depressive disorder, single episode, unspecified: Secondary | ICD-10-CM | POA: Diagnosis not present

## 2011-11-16 DIAGNOSIS — Z4889 Encounter for other specified surgical aftercare: Secondary | ICD-10-CM | POA: Diagnosis not present

## 2011-11-16 DIAGNOSIS — E119 Type 2 diabetes mellitus without complications: Secondary | ICD-10-CM | POA: Diagnosis not present

## 2011-11-16 DIAGNOSIS — F3289 Other specified depressive episodes: Secondary | ICD-10-CM | POA: Diagnosis not present

## 2011-11-16 DIAGNOSIS — J449 Chronic obstructive pulmonary disease, unspecified: Secondary | ICD-10-CM | POA: Diagnosis not present

## 2011-11-16 DIAGNOSIS — G252 Other specified forms of tremor: Secondary | ICD-10-CM | POA: Diagnosis not present

## 2011-11-18 ENCOUNTER — Encounter: Payer: Self-pay | Admitting: Internal Medicine

## 2011-11-18 ENCOUNTER — Ambulatory Visit (INDEPENDENT_AMBULATORY_CARE_PROVIDER_SITE_OTHER): Payer: Medicare Other | Admitting: Internal Medicine

## 2011-11-18 VITALS — BP 142/71 | HR 81 | Temp 97.7°F | Ht 63.5 in | Wt 277.8 lb

## 2011-11-18 DIAGNOSIS — Z0000 Encounter for general adult medical examination without abnormal findings: Secondary | ICD-10-CM

## 2011-11-18 DIAGNOSIS — M25519 Pain in unspecified shoulder: Secondary | ICD-10-CM

## 2011-11-18 DIAGNOSIS — E119 Type 2 diabetes mellitus without complications: Secondary | ICD-10-CM

## 2011-11-18 DIAGNOSIS — M25511 Pain in right shoulder: Secondary | ICD-10-CM

## 2011-11-18 DIAGNOSIS — I10 Essential (primary) hypertension: Secondary | ICD-10-CM

## 2011-11-18 DIAGNOSIS — J329 Chronic sinusitis, unspecified: Secondary | ICD-10-CM

## 2011-11-18 LAB — GLUCOSE, CAPILLARY: Glucose-Capillary: 166 mg/dL — ABNORMAL HIGH (ref 70–99)

## 2011-11-18 LAB — POCT GLYCOSYLATED HEMOGLOBIN (HGB A1C): Hemoglobin A1C: 7

## 2011-11-18 MED ORDER — MOMETASONE FUROATE 50 MCG/ACT NA SUSP: 2.0000 | Freq: Every day | NASAL | Status: DC

## 2011-11-18 MED ORDER — GLUCOSE BLOOD VI STRP: 1.0000 | ORAL_STRIP | Freq: Three times a day (TID) | Status: DC

## 2011-11-18 NOTE — Progress Notes (Signed)
  Subjective:    Patient ID: [name] Rice, female    DOB: 1956-06-06, 56 y.o.   MRN: [id number]  HPI: 56 year old woman with past medical history significant for diabetes, hypertension, benign paroxysmal positional vertigo, partial rotator cuff tear s/p  surgery  In 01/13 comes to the  clinic for followup visit.  1) Shoulder pain: She still complains of some shoulder pain post surgery but states that it's getting better.  2) Diabetes: She states that she noticed some highs in her blood sugar due to which she she has been giving herself more insulin with the sliding scale. She states that she had one episode of hypoglycemia in the last month when her blood sugar was running in the 50s and she woke up drenched in sweats. But she states - for that particular date she didn't eat at all and was sleeping all the day.  She was requesting some adjustment in the refills of insulin because of the increased dose she is requiring.    Review of Systems  Constitutional: Negative for fever, chills, diaphoresis and fatigue.  HENT: Negative for nosebleeds, congestion, rhinorrhea, sneezing, neck pain and postnasal drip.   Respiratory: Negative for apnea, cough, chest tightness and shortness of breath.   Cardiovascular: Negative for chest pain, palpitations and leg swelling.  Gastrointestinal: Negative for abdominal pain.  Genitourinary: Negative for dysuria.  Musculoskeletal: Negative for arthralgias.  Neurological: Negative for dizziness, seizures, facial asymmetry, light-headedness, numbness and headaches.  Hematological: Negative for adenopathy.       Objective:   Physical Exam  Constitutional: She is oriented to person, place, and time. She appears well-developed and well-nourished. No distress.  HENT:  Head: Normocephalic and atraumatic.  Mouth/Throat: No oropharyngeal exudate.  Eyes: Conjunctivae and EOM are normal. Pupils are equal, round, and reactive to light.  Neck: Normal range of motion.  Neck supple. No JVD present. No tracheal deviation present. No thyromegaly present.  Cardiovascular: Normal rate, regular rhythm and intact distal pulses.  Exam reveals no gallop and no friction rub.   No murmur heard. Pulmonary/Chest: Effort normal and breath sounds normal. No stridor. No respiratory distress. She has no wheezes. She has no rales. She exhibits no tenderness.  Abdominal: Soft. Bowel sounds are normal. She exhibits no distension and no mass. There is no tenderness. There is no rebound and no guarding.  Musculoskeletal: Normal range of motion. She exhibits no edema and no tenderness.  Lymphadenopathy:    She has no cervical adenopathy.  Neurological: She is alert and oriented to person, place, and time. She has normal reflexes. She displays normal reflexes. No cranial nerve deficit. Coordination normal.  Skin: Skin is warm. She is not diaphoretic.          Assessment & Plan:

## 2011-11-18 NOTE — Patient Instructions (Signed)
Please schedule a follow up appointment in 3 months. Please bring your glucometer with the next appointment. Please take your medicines as prescribed.

## 2011-11-21 DIAGNOSIS — Z4889 Encounter for other specified surgical aftercare: Secondary | ICD-10-CM | POA: Diagnosis not present

## 2011-11-21 DIAGNOSIS — J449 Chronic obstructive pulmonary disease, unspecified: Secondary | ICD-10-CM | POA: Diagnosis not present

## 2011-11-21 DIAGNOSIS — F329 Major depressive disorder, single episode, unspecified: Secondary | ICD-10-CM | POA: Diagnosis not present

## 2011-11-21 DIAGNOSIS — G250 Essential tremor: Secondary | ICD-10-CM | POA: Diagnosis not present

## 2011-11-21 DIAGNOSIS — F3289 Other specified depressive episodes: Secondary | ICD-10-CM | POA: Diagnosis not present

## 2011-11-21 DIAGNOSIS — G252 Other specified forms of tremor: Secondary | ICD-10-CM | POA: Diagnosis not present

## 2011-11-21 DIAGNOSIS — E119 Type 2 diabetes mellitus without complications: Secondary | ICD-10-CM | POA: Diagnosis not present

## 2011-11-22 NOTE — Assessment & Plan Note (Signed)
She had some paperwork for car sticker that she got filled.

## 2011-11-22 NOTE — Assessment & Plan Note (Addendum)
Lab Results  Component Value Date   HGBA1C 7.0 11/18/2011   HGBA1C 7.2 09/07/2010   CREATININE 1.18* 10/21/2011   CREATININE 0.92 08/08/2011   MICROALBUR 0.57 04/04/2011   MICRALBCREAT 15.5 04/04/2011   CHOL 180 04/04/2011   HDL 47 04/04/2011   TRIG 169* 04/04/2011    Last eye exam and foot exam:     Assessment: She does report few episodes of hypoglycemia when her CBG went as low as in 50's but at the same time she reports that she did not eat and was sleeping all day. She states that off late her CBG's were running high and she has been using some extra units with her sliding scale. We could not download her meter, but she noted some values form me and I noted few highs in the evening around 300's. Diabetes control: controlled Progress toward goals: at goal Barriers to meeting goals: no barriers identified  Plan:  In the setting of these reported hypoglycemic events, counseled her not to use extra - units with sliding scale. She voices clear understanding Diabetes treatment: continue current medications Refer to: none Instruction/counseling given: reminded to get eye exam, reminded to bring blood glucose meter & log to each visit, reminded to bring medications to each visit, discussed foot care, discussed the need for weight loss and discussed diet

## 2011-11-22 NOTE — Assessment & Plan Note (Signed)
Lab Results  Component Value Date   NA 139 10/21/2011   K 3.4* 10/21/2011   CL 102 10/21/2011   CO2 26 10/21/2011   BUN 14 10/21/2011   CREATININE 1.18* 10/21/2011   CREATININE 0.92 08/08/2011    BP Readings from Last 3 Encounters:  11/18/11 142/71  10/28/11 120/76  10/28/11 120/76    Assessment: Hypertension control: mildly elevated Progress toward goals: slightly above goal Barriers to meeting goals:  no barriers identified  Plan: Hypertension treatment:  continue current medications

## 2011-11-22 NOTE — Assessment & Plan Note (Signed)
Shoulder pain continues to improve. The surgery site appears to be healing well.

## 2011-11-23 ENCOUNTER — Other Ambulatory Visit: Payer: Self-pay | Admitting: Internal Medicine

## 2011-11-23 DIAGNOSIS — E119 Type 2 diabetes mellitus without complications: Secondary | ICD-10-CM | POA: Diagnosis not present

## 2011-11-23 DIAGNOSIS — Z4889 Encounter for other specified surgical aftercare: Secondary | ICD-10-CM | POA: Diagnosis not present

## 2011-11-23 DIAGNOSIS — F329 Major depressive disorder, single episode, unspecified: Secondary | ICD-10-CM | POA: Diagnosis not present

## 2011-11-23 DIAGNOSIS — F3289 Other specified depressive episodes: Secondary | ICD-10-CM | POA: Diagnosis not present

## 2011-11-23 DIAGNOSIS — M25519 Pain in unspecified shoulder: Secondary | ICD-10-CM

## 2011-11-23 DIAGNOSIS — J449 Chronic obstructive pulmonary disease, unspecified: Secondary | ICD-10-CM | POA: Diagnosis not present

## 2011-11-23 DIAGNOSIS — G252 Other specified forms of tremor: Secondary | ICD-10-CM | POA: Diagnosis not present

## 2011-11-23 DIAGNOSIS — G250 Essential tremor: Secondary | ICD-10-CM | POA: Diagnosis not present

## 2011-11-28 DIAGNOSIS — Z4889 Encounter for other specified surgical aftercare: Secondary | ICD-10-CM | POA: Diagnosis not present

## 2011-11-28 DIAGNOSIS — E119 Type 2 diabetes mellitus without complications: Secondary | ICD-10-CM | POA: Diagnosis not present

## 2011-11-28 DIAGNOSIS — J449 Chronic obstructive pulmonary disease, unspecified: Secondary | ICD-10-CM | POA: Diagnosis not present

## 2011-11-28 DIAGNOSIS — F3289 Other specified depressive episodes: Secondary | ICD-10-CM | POA: Diagnosis not present

## 2011-11-28 DIAGNOSIS — F329 Major depressive disorder, single episode, unspecified: Secondary | ICD-10-CM | POA: Diagnosis not present

## 2011-11-28 DIAGNOSIS — G250 Essential tremor: Secondary | ICD-10-CM | POA: Diagnosis not present

## 2011-11-28 MED ORDER — INSULIN ASPART 100 UNIT/ML ~~LOC~~ SOLN: 60.0000 [IU] | Freq: Three times a day (TID) | SUBCUTANEOUS | Status: DC

## 2011-11-28 MED ORDER — IBUPROFEN 200 MG PO TABS: 200.0000 mg | ORAL_TABLET | Freq: Three times a day (TID) | ORAL | Status: AC | PRN

## 2011-11-28 MED ORDER — INSULIN GLARGINE 100 UNIT/ML ~~LOC~~ SOLN: 120.0000 [IU] | Freq: Every day | SUBCUTANEOUS | Status: DC

## 2011-11-29 ENCOUNTER — Other Ambulatory Visit: Payer: Self-pay | Admitting: Internal Medicine

## 2011-11-29 MED ORDER — INSULIN ASPART 100 UNIT/ML ~~LOC~~ SOLN: 60.0000 [IU] | Freq: Three times a day (TID) | SUBCUTANEOUS | Status: DC

## 2011-11-29 MED ORDER — INSULIN GLARGINE 100 UNIT/ML ~~LOC~~ SOLN: 120.0000 [IU] | Freq: Every day | SUBCUTANEOUS | Status: DC

## 2011-11-29 NOTE — Telephone Encounter (Signed)
I had to change the number of vials for lantus and Novolog as per the patient's requirements.  We also faxed prior authorization for her lantus and cymbalta today.  Thanks, [employer]

## 2011-11-30 DIAGNOSIS — G250 Essential tremor: Secondary | ICD-10-CM | POA: Diagnosis not present

## 2011-11-30 DIAGNOSIS — F329 Major depressive disorder, single episode, unspecified: Secondary | ICD-10-CM | POA: Diagnosis not present

## 2011-11-30 DIAGNOSIS — E119 Type 2 diabetes mellitus without complications: Secondary | ICD-10-CM | POA: Diagnosis not present

## 2011-11-30 DIAGNOSIS — F3289 Other specified depressive episodes: Secondary | ICD-10-CM | POA: Diagnosis not present

## 2011-11-30 DIAGNOSIS — J449 Chronic obstructive pulmonary disease, unspecified: Secondary | ICD-10-CM | POA: Diagnosis not present

## 2011-11-30 DIAGNOSIS — Z4889 Encounter for other specified surgical aftercare: Secondary | ICD-10-CM | POA: Diagnosis not present

## 2011-12-09 ENCOUNTER — Telehealth: Payer: Self-pay | Admitting: *Deleted

## 2011-12-09 DIAGNOSIS — M75110 Incomplete rotator cuff tear or rupture of unspecified shoulder, not specified as traumatic: Secondary | ICD-10-CM | POA: Diagnosis not present

## 2011-12-09 DIAGNOSIS — J329 Chronic sinusitis, unspecified: Secondary | ICD-10-CM

## 2011-12-09 DIAGNOSIS — Q7290 Unspecified reduction defect of unspecified lower limb: Secondary | ICD-10-CM | POA: Diagnosis not present

## 2011-12-09 MED ORDER — AMOXICILLIN 500 MG PO CAPS: 500.0000 mg | ORAL_CAPSULE | Freq: Three times a day (TID) | ORAL | Status: AC

## 2011-12-09 NOTE — Telephone Encounter (Signed)
Pt called and left message. Stated she saw Dr Dorthula Rue Feb 1 for  Sinus infection and tried the things she instructed her to do including  The spray and steam pot.  But now it is worse wit right side pain and drainage. Please advise. Also wants you to know she saw her Ortho MD and received cortisone injection.

## 2011-12-09 NOTE — Telephone Encounter (Signed)
I called her back. She reports having greenish discharge from the right side of her nose associated with some headaches. I would treat her with amoxicillin for now because most common organism are Strep or H. flu .But given her diabetes pseudomonas is always on differential .  -Would treat her with amox for 5 days. -She was advised steam inhalation and flonase drops to help with congestion. -If it does not get better in 1 week, she was advised to follow up in the clinic  Patient allergies noted- She is allergic to cefaclor but she states that she has taken amox in the past without any reaction.She was re- emphasized to come to the ER if she gets any reaction from AMox.

## 2011-12-15 ENCOUNTER — Other Ambulatory Visit: Payer: Self-pay | Admitting: Internal Medicine

## 2011-12-16 ENCOUNTER — Other Ambulatory Visit: Payer: Self-pay | Admitting: *Deleted

## 2011-12-16 NOTE — Telephone Encounter (Signed)
Opened in error

## 2011-12-16 NOTE — Telephone Encounter (Signed)
Rx called in 

## 2011-12-19 ENCOUNTER — Other Ambulatory Visit: Payer: Self-pay | Admitting: Internal Medicine

## 2011-12-21 ENCOUNTER — Telehealth: Payer: Self-pay | Admitting: *Deleted

## 2011-12-21 NOTE — Telephone Encounter (Signed)
Pt called stating her insurance will not cover 2 of her meds and she would like them changed if possible. Advair is not covered but symbicort is covered. Claritin is not covered but do cover singular.  Pharmacy [facility] on [address]

## 2011-12-22 ENCOUNTER — Other Ambulatory Visit: Payer: Self-pay | Admitting: *Deleted

## 2011-12-23 NOTE — Telephone Encounter (Signed)
Empty encounter

## 2011-12-26 MED ORDER — BUDESONIDE-FORMOTEROL FUMARATE 160-4.5 MCG/ACT IN AERO: 2.0000 | INHALATION_SPRAY | Freq: Two times a day (BID) | RESPIRATORY_TRACT | Status: DC

## 2011-12-26 MED ORDER — MONTELUKAST SODIUM 10 MG PO TABS: 10.0000 mg | ORAL_TABLET | Freq: Every day | ORAL | Status: DC

## 2011-12-26 NOTE — Telephone Encounter (Signed)
Medicines changed and the prescription was electronically sent.  Thanks, [employer]

## 2011-12-27 ENCOUNTER — Other Ambulatory Visit: Payer: Self-pay | Admitting: Internal Medicine

## 2011-12-31 ENCOUNTER — Other Ambulatory Visit: Payer: Self-pay | Admitting: Internal Medicine

## 2012-01-02 ENCOUNTER — Other Ambulatory Visit: Payer: Self-pay | Admitting: Internal Medicine

## 2012-01-25 ENCOUNTER — Other Ambulatory Visit: Payer: Self-pay | Admitting: Internal Medicine

## 2012-01-25 DIAGNOSIS — G4700 Insomnia, unspecified: Secondary | ICD-10-CM

## 2012-01-25 DIAGNOSIS — M25519 Pain in unspecified shoulder: Secondary | ICD-10-CM

## 2012-01-27 NOTE — Telephone Encounter (Signed)
The insomnia appears to have been last addressed by Dr. Odis Luster in April 2012.  Apparently poor sleep hygiene and depression/anxiety were playing a major role at that time.  She has an appointment with Dr. Dorthula Rue on 02/27/2012.  Will ask that Dr. Dorthula Rue readdress the insomnia at that time.  In the interim I will prescribe one month of Ambien with no refills.

## 2012-01-27 NOTE — Telephone Encounter (Signed)
Rx called into pharmacy. Stanton Kidney Francile Woolford RN 01/27/12 5PM

## 2012-01-28 ENCOUNTER — Other Ambulatory Visit: Payer: Self-pay | Admitting: Internal Medicine

## 2012-01-31 NOTE — Telephone Encounter (Signed)
Lyrica rx called to [facility] pharmacy. 

## 2012-02-16 ENCOUNTER — Other Ambulatory Visit: Payer: Self-pay | Admitting: *Deleted

## 2012-02-16 DIAGNOSIS — F32A Depression, unspecified: Secondary | ICD-10-CM

## 2012-02-16 DIAGNOSIS — F329 Major depressive disorder, single episode, unspecified: Secondary | ICD-10-CM

## 2012-02-19 MED ORDER — DULOXETINE HCL 20 MG PO CPEP: ORAL_CAPSULE | ORAL | Status: DC

## 2012-02-19 MED ORDER — DULOXETINE HCL 60 MG PO CPEP: 60.0000 mg | ORAL_CAPSULE | Freq: Every day | ORAL | Status: DC

## 2012-02-27 ENCOUNTER — Encounter: Payer: Self-pay | Admitting: Internal Medicine

## 2012-02-27 ENCOUNTER — Ambulatory Visit (INDEPENDENT_AMBULATORY_CARE_PROVIDER_SITE_OTHER): Payer: Medicare Other | Admitting: Internal Medicine

## 2012-02-27 VITALS — BP 128/71 | HR 75 | Temp 98.1°F | Ht 63.0 in | Wt 281.7 lb

## 2012-02-27 DIAGNOSIS — J309 Allergic rhinitis, unspecified: Secondary | ICD-10-CM | POA: Diagnosis not present

## 2012-02-27 DIAGNOSIS — E119 Type 2 diabetes mellitus without complications: Secondary | ICD-10-CM

## 2012-02-27 DIAGNOSIS — M25511 Pain in right shoulder: Secondary | ICD-10-CM

## 2012-02-27 DIAGNOSIS — Z1231 Encounter for screening mammogram for malignant neoplasm of breast: Secondary | ICD-10-CM

## 2012-02-27 DIAGNOSIS — L219 Seborrheic dermatitis, unspecified: Secondary | ICD-10-CM

## 2012-02-27 DIAGNOSIS — J302 Other seasonal allergic rhinitis: Secondary | ICD-10-CM

## 2012-02-27 DIAGNOSIS — I10 Essential (primary) hypertension: Secondary | ICD-10-CM

## 2012-02-27 DIAGNOSIS — G4700 Insomnia, unspecified: Secondary | ICD-10-CM

## 2012-02-27 DIAGNOSIS — K219 Gastro-esophageal reflux disease without esophagitis: Secondary | ICD-10-CM | POA: Diagnosis not present

## 2012-02-27 DIAGNOSIS — Z0000 Encounter for general adult medical examination without abnormal findings: Secondary | ICD-10-CM

## 2012-02-27 DIAGNOSIS — M25519 Pain in unspecified shoulder: Secondary | ICD-10-CM

## 2012-02-27 LAB — GLUCOSE, CAPILLARY: Glucose-Capillary: 246 mg/dL — ABNORMAL HIGH (ref 70–99)

## 2012-02-27 MED ORDER — SELENIUM SULFIDE 1 % EX LOTN: 1.0000 "application " | TOPICAL_LOTION | Freq: Every day | CUTANEOUS | Status: DC

## 2012-02-27 MED ORDER — INSULIN PEN NEEDLE 31G X 5 MM MISC: 1.0000 | Freq: Three times a day (TID) | Status: DC

## 2012-02-27 MED ORDER — METFORMIN HCL 1000 MG PO TABS: 1000.0000 mg | ORAL_TABLET | Freq: Two times a day (BID) | ORAL | Status: DC

## 2012-02-27 MED ORDER — HYDROCODONE-ACETAMINOPHEN 5-325 MG PO TABS: 1.0000 | ORAL_TABLET | Freq: Every day | ORAL | Status: AC

## 2012-02-27 MED ORDER — PREGABALIN 100 MG PO CAPS: 100.0000 mg | ORAL_CAPSULE | Freq: Two times a day (BID) | ORAL | Status: DC

## 2012-02-27 MED ORDER — PRAVASTATIN SODIUM 40 MG PO TABS: 40.0000 mg | ORAL_TABLET | Freq: Every day | ORAL | Status: DC

## 2012-02-27 MED ORDER — PANTOPRAZOLE SODIUM 40 MG PO TBEC: 40.0000 mg | DELAYED_RELEASE_TABLET | Freq: Every day | ORAL | Status: DC

## 2012-02-27 MED ORDER — OLOPATADINE HCL 0.1 % OP SOLN: 1.0000 [drp] | Freq: Two times a day (BID) | OPHTHALMIC | Status: DC | PRN

## 2012-02-27 MED ORDER — PROPRANOLOL HCL 20 MG PO TABS: 20.0000 mg | ORAL_TABLET | Freq: Two times a day (BID) | ORAL | Status: DC

## 2012-02-27 MED ORDER — ZOLPIDEM TARTRATE 10 MG PO TABS: 10.0000 mg | ORAL_TABLET | Freq: Every evening | ORAL | Status: DC | PRN

## 2012-02-27 NOTE — Patient Instructions (Signed)
Please schedule a follow up appointment in 3 months . Please bring your medication bottles with your next appointment. Please take your medicines as prescribed. I will call you with your lab results if anything will be abnormal. 

## 2012-02-27 NOTE — Progress Notes (Signed)
  Subjective:    Patient ID: [name] Rice, female    DOB: 10-31-1955, 56 y.o.   MRN: [id number]  HPI: 56 year old woman with past medical history significant for type 2 diabetes mellitus, seborrheic keratosis, hypertension, rotator cuff tear s/p surgery in 01/13 comes to the clinic for a followup visit.  1. Diabetes: She states that her blood sugars have been running high since she received a corticosteroid shot for her shoulder pain (likely secondary to bursitis) back in February of 2013. Some of the fasting readings were noted to be in high 200's, and random of 400 was also seen. She herself increased her sliding scale from 60 units of NovoLog 3 times a day  gradually to 80 units of NovoLog 3 times a day and states that her blood sugars have now been started to get under better control with the new increased dose. In addition to that, she continues to take one 20 units of Lantus and metformin twice a day. She endorses eating cakes on Mother 's day that further shoo ted up her blood sugars.Denies any hypoglycemic events.  2. Seborrheic dermatitis -patient has been complaining of increased itching on her face and ears  was requesting to give refills for medications. She is also requesting for other medication refills.  Otherwise denies any chest pain, abdominal pain, alteration in bladder or bowel habits.    Review of Systems  Constitutional: Negative for fever, chills and fatigue.  HENT: Negative for nosebleeds, congestion, rhinorrhea and postnasal drip.   Eyes: Negative for photophobia and visual disturbance.  Respiratory: Negative for choking, chest tightness, shortness of breath and wheezing.   Cardiovascular: Negative for chest pain, palpitations and leg swelling.  Genitourinary: Negative for dysuria, frequency, hematuria and flank pain.  Musculoskeletal: Negative for arthralgias.  Neurological: Negative for facial asymmetry and headaches.  Psychiatric/Behavioral: Negative for agitation.         Objective:   Physical Exam  Constitutional: She is oriented to person, place, and time. She appears well-developed and well-nourished. No distress.       Morbidly obese  HENT:  Head: Normocephalic and atraumatic.  Mouth/Throat: No oropharyngeal exudate.  Eyes: Conjunctivae are normal. Pupils are equal, round, and reactive to light.  Neck: Normal range of motion. Neck supple. No JVD present. No tracheal deviation present. No thyromegaly present.  Cardiovascular: Normal rate, regular rhythm and normal heart sounds.  Exam reveals no gallop and no friction rub.   No murmur heard. Pulmonary/Chest: Effort normal and breath sounds normal. No stridor. No respiratory distress. She has no wheezes. She has no rales.  Abdominal: Soft. Bowel sounds are normal. She exhibits no distension. There is no tenderness. There is no rebound.  Musculoskeletal:       Limited abduction of the right shoulder.  Neurological: She is alert and oriented to person, place, and time. She has normal reflexes. No cranial nerve deficit. Coordination normal.  Skin: She is not diaphoretic.          Assessment & Plan:

## 2012-02-28 NOTE — Assessment & Plan Note (Signed)
Her prescription for topical selenium sulfide was refilled.

## 2012-02-28 NOTE — Assessment & Plan Note (Signed)
She continues to need refills on her Vicodin and Lyrica for her shoulder pain (takes one pill of Vicodin a day). She was again educated on the adverse effects of the narcotics including possible addiction. She voices understanding and states that she would take them only when absolutely needed.

## 2012-02-28 NOTE — Assessment & Plan Note (Signed)
Lab Results  Component Value Date   HGBA1C 7.7 02/27/2012   HGBA1C 7.2 09/07/2010   CREATININE 1.18* 10/21/2011   CREATININE 0.92 08/08/2011   MICROALBUR 0.57 04/04/2011   MICRALBCREAT 15.5 04/04/2011   CHOL 180 04/04/2011   HDL 47 04/04/2011   TRIG 169* 04/04/2011    Last eye exam and foot exam: Foot exam was done with today' s visit.  Assessment: Diabetes control: not controlled Progress toward goals: deteriorated Barriers to meeting goals: no barriers identified  Plan: Diabetes treatment: Patient has self increased her sliding scale dose based on the CBG readings. Patient is morbidly obese. She was advised to work on weight reduction that would help with better control of blood sugars. We discussed in detail about the importance of healthy diet and weight reduction for her better control of diabetes. Continue current medications. Refer to: diabetes educator for self-management training and diabetes educator for medical nutrition therapy Instruction/counseling given: reminded to get eye exam, reminded to bring blood glucose meter & log to each visit, reminded to bring medications to each visit, discussed foot care, discussed the need for weight loss and discussed diet

## 2012-02-28 NOTE — Assessment & Plan Note (Signed)
Mammogram referral  was placed.

## 2012-02-28 NOTE — Assessment & Plan Note (Signed)
Lab Results  Component Value Date   NA 139 10/21/2011   K 3.4* 10/21/2011   CL 102 10/21/2011   CO2 26 10/21/2011   BUN 14 10/21/2011   CREATININE 1.18* 10/21/2011   CREATININE 0.92 08/08/2011    BP Readings from Last 3 Encounters:  02/27/12 128/71  11/18/11 142/71  10/28/11 120/76    Assessment: Hypertension control:  controlled  Progress toward goals:  at goal Barriers to meeting goals:  no barriers identified  Plan: Hypertension treatment:  continue current medications. Would consider giving her a trial of ARB's with next visit, given her diabetes and allergy to ACE - I.

## 2012-03-25 ENCOUNTER — Other Ambulatory Visit: Payer: Self-pay | Admitting: Internal Medicine

## 2012-03-26 ENCOUNTER — Other Ambulatory Visit: Payer: Self-pay | Admitting: *Deleted

## 2012-03-26 DIAGNOSIS — G4700 Insomnia, unspecified: Secondary | ICD-10-CM

## 2012-03-26 MED ORDER — ZOLPIDEM TARTRATE 10 MG PO TABS: 10.0000 mg | ORAL_TABLET | Freq: Every evening | ORAL | Status: DC | PRN

## 2012-03-26 NOTE — Telephone Encounter (Signed)
Rx called in to pharmacy. 

## 2012-03-28 ENCOUNTER — Telehealth: Payer: Self-pay | Admitting: *Deleted

## 2012-03-28 NOTE — Telephone Encounter (Signed)
Has been sick with bronchitis past 3 days. Has nonproductive cough. Appt made for pt 03/29/12. Stanton Kidney Ingrid Shifrin RN 03/28/12 4PM

## 2012-03-29 ENCOUNTER — Ambulatory Visit (INDEPENDENT_AMBULATORY_CARE_PROVIDER_SITE_OTHER): Payer: Medicare Other | Admitting: Internal Medicine

## 2012-03-29 VITALS — BP 144/72 | HR 84 | Temp 98.5°F | Ht 63.0 in | Wt 281.9 lb

## 2012-03-29 DIAGNOSIS — E139 Other specified diabetes mellitus without complications: Secondary | ICD-10-CM

## 2012-03-29 DIAGNOSIS — E119 Type 2 diabetes mellitus without complications: Secondary | ICD-10-CM | POA: Diagnosis not present

## 2012-03-29 DIAGNOSIS — J019 Acute sinusitis, unspecified: Secondary | ICD-10-CM | POA: Diagnosis not present

## 2012-03-29 DIAGNOSIS — F5089 Other specified eating disorder: Secondary | ICD-10-CM

## 2012-03-29 DIAGNOSIS — D509 Iron deficiency anemia, unspecified: Secondary | ICD-10-CM | POA: Insufficient documentation

## 2012-03-29 LAB — CBC
MCH: 22.4 pg — ABNORMAL LOW (ref 26.0–34.0)
MCHC: 31.2 g/dL (ref 30.0–36.0)
Platelets: 286 10*3/uL (ref 150–400)
RDW: 17.9 % — ABNORMAL HIGH (ref 11.5–15.5)

## 2012-03-29 LAB — IRON: Iron: 27 ug/dL — ABNORMAL LOW (ref 42–145)

## 2012-03-29 NOTE — Assessment & Plan Note (Signed)
Patient has acute rhinosinusitis, exacerbated by underlying asthma and obesity. She has been doing the right things, including using her Azmacort, albuterol, Nasonex, and Singulair. She has also been doing saltwater gargles. She was concerned that it may turn into bronchitis and pneumonia, but her lungs currently sound clear, she is afebrile, and she does have significant sputum production. - Continue symptomatic relief - RTC if symptoms worsen or if fever becomes greater than 101

## 2012-03-29 NOTE — Progress Notes (Signed)
Subjective:     Patient ID: [name] Rice, female   DOB: 1956-04-07, 56 y.o.   MRN: [id number]  HPI Patient is a 56 year old woman with history of diabetes, hypertension, hyperlipidemia who presents with URI.  Patient says for the past few days she has been drinking increasing rhinorrhea, right ear fullness, temperatures to 99.9, sore throat, cough with clear white sputum. She has noted that her family members with whom she lives had also had recent upper respiratory illnesses.  Review of Systems As per history of present illness    Objective:   Physical Exam VItal signs reviewed and stable. GEN: No apparent distress.  Alert and oriented x 3.  Pleasant, conversant, and cooperative to exam. HEENT: NCAT.  Neck is supple without palpable masses or lymphadenopathy.  EOMI.  PERRLA.  Sclerae anicteric.  Conjunctivae noninjected. MMM.  Oropharynx is without erythema, exudates, or other abnormal lesions. RESP:  Lungs are clear to ascultation bilaterally with good air movement.  No wheezes, ronchi, or rubs. CARDIOVASCULAR: regular rate, normal rhythm.  Clear S1, S2, no murmurs, gallops, or rubs. EXT: warm and dry.  Peripheral pulses equal, intact, and +2 globally.  No clubbing or cyanosis.  No edema in b/l lower extremeties      Assessment:         Plan:

## 2012-03-29 NOTE — Assessment & Plan Note (Addendum)
Patient does complain of long-standing ice chip craving. Has been borderline anemic, borderline microcytic, and borderline iron deficient and past labs. - CBC - Iron - Ferritin

## 2012-04-02 ENCOUNTER — Telehealth: Payer: Self-pay | Admitting: Internal Medicine

## 2012-04-02 ENCOUNTER — Encounter: Payer: Self-pay | Admitting: Internal Medicine

## 2012-04-02 DIAGNOSIS — D509 Iron deficiency anemia, unspecified: Secondary | ICD-10-CM

## 2012-04-02 DIAGNOSIS — F5089 Other specified eating disorder: Secondary | ICD-10-CM

## 2012-04-02 MED ORDER — FERROUS SULFATE 325 (65 FE) MG PO TABS: 325.0000 mg | ORAL_TABLET | Freq: Every day | ORAL | Status: DC

## 2012-04-02 MED ORDER — DOCUSATE SODIUM 100 MG PO CAPS: 100.0000 mg | ORAL_CAPSULE | Freq: Two times a day (BID) | ORAL | Status: DC

## 2012-04-02 NOTE — Telephone Encounter (Signed)
Called to discuss blood tests that indicate iron deficiency anemia. The patient did have a colonoscopy in 2010 with 2 hyperplastic polyps and some diverticulosis. She denies any vaginal bleeding, rectal bleeding, melena, abdominal pain.  Plan: - Send patient Hemoccult cards - Start iron 325 mg 3 times a day - Start Colace 100 mg twice a day

## 2012-04-02 NOTE — Assessment & Plan Note (Signed)
Called to discuss blood tests that indicate iron deficiency anemia. The patient did have a colonoscopy in 2010 with 2 hyperplastic polyps and some diverticulosis. She denies any vaginal bleeding, rectal bleeding, melena, abdominal pain.  Plan: - Send patient Hemoccult cards - Start iron 325 mg 3 times a day - Start Colace 100 mg twice a day - Consider EGD, B12, folate, TTG testing pending results

## 2012-04-03 ENCOUNTER — Ambulatory Visit (HOSPITAL_COMMUNITY): Payer: Medicare Other

## 2012-04-10 ENCOUNTER — Ambulatory Visit (INDEPENDENT_AMBULATORY_CARE_PROVIDER_SITE_OTHER): Payer: Medicare Other | Admitting: Internal Medicine

## 2012-04-10 ENCOUNTER — Ambulatory Visit (HOSPITAL_COMMUNITY)
Admission: RE | Admit: 2012-04-10 | Discharge: 2012-04-10 | Disposition: A | Discharge status: Home / Self Care | Payer: Medicare Other | Source: Ambulatory Visit | Attending: Internal Medicine | Admitting: Internal Medicine

## 2012-04-10 ENCOUNTER — Encounter: Payer: Self-pay | Admitting: Internal Medicine

## 2012-04-10 VITALS — BP 114/62 | HR 91 | Temp 97.8°F | Ht 63.0 in | Wt 283.3 lb

## 2012-04-10 DIAGNOSIS — M79609 Pain in unspecified limb: Secondary | ICD-10-CM

## 2012-04-10 DIAGNOSIS — J019 Acute sinusitis, unspecified: Secondary | ICD-10-CM | POA: Diagnosis not present

## 2012-04-10 DIAGNOSIS — M79605 Pain in left leg: Secondary | ICD-10-CM | POA: Insufficient documentation

## 2012-04-10 DIAGNOSIS — E119 Type 2 diabetes mellitus without complications: Secondary | ICD-10-CM

## 2012-04-10 DIAGNOSIS — M25579 Pain in unspecified ankle and joints of unspecified foot: Secondary | ICD-10-CM | POA: Diagnosis not present

## 2012-04-10 DIAGNOSIS — M7730 Calcaneal spur, unspecified foot: Secondary | ICD-10-CM | POA: Diagnosis not present

## 2012-04-10 DIAGNOSIS — E139 Other specified diabetes mellitus without complications: Secondary | ICD-10-CM

## 2012-04-10 MED ORDER — AMOXICILLIN-POT CLAVULANATE 500-125 MG PO TABS: 1.0000 | ORAL_TABLET | Freq: Three times a day (TID) | ORAL | Status: AC

## 2012-04-10 NOTE — Assessment & Plan Note (Signed)
Point tenderness over the medial leg roughly 4 inches superior to the medial malleolus is of unclear etiology. This has been going on for 6 months and may be related the patient's multiple falls over the past year. She denies any recent trauma. Differential includes bruise versus fracture, DVT, claudication, and diabetic neuropathic pain. The pain is extremely localized to a small point, but not over the bone. I think DVT is less likely given the focality. She may have diabetes pain, but she says this is different. - X-ray left ankle with focus on area above the medial malleolus - Consider Doppler if pain persists to rule out DVT - Consider gabapentin - RTC as scheduled

## 2012-04-10 NOTE — Assessment & Plan Note (Signed)
Patient with continued symptoms of acute rhinosinusitis. She is feeling worse than she did 12 days ago. She does not exhibit unilateral symptoms, bleeding, or fever. Nonetheless, given her duration of symptoms now being over 2 weeks, we will treat her with a course of Augmentin. I still think this is likely viral, and I explained this to the patient. - Augmentin 500/125 3 times a day x7 days

## 2012-04-10 NOTE — Progress Notes (Signed)
Subjective:     Patient ID: [name] Rice, female   DOB: 11-Dec-1955, 56 y.o.   MRN: [id number]  HPI Patient is a 56 year old woman with history of diabetes, hypertension, hyperlipidemia who presents for f/u of URI type sx.  Since her office visit 12 days ago, the patient continues to feel poorly. She complains of bilateral sinus pressure, coughing with mild production, fatigue, nasal discharge. She has been using her Singulair, Nasonex, albuterol,and neti pot with some relief. She denies fevers. Overall compared to 12 days ago she feels worse.  Patient also complains of in her left shin pain. This has been going on for over 6 months. The pain is sharp and worse with pressure or palpation of the area. There is no pain with walking. The patient does get radicular pain down the back of the leg into the foot, but this pain is different. She also gets cramping in the back of her, but this pain is yet again different. She notices it most when she is lying down and her right leg presses against her left.  Review of Systems As per history of present illness    Objective:   Physical Exam Gen: NAD, cooperative CV: RRR Chest: CTAB MSK: L ankle with good ROM, no TTP.  L leg roughly 3 inches above medial malleolus has very mild point tenderness.  No visible or palpable deformities.    Assessment:         Plan:

## 2012-04-15 ENCOUNTER — Other Ambulatory Visit: Payer: Self-pay | Admitting: Internal Medicine

## 2012-04-16 ENCOUNTER — Other Ambulatory Visit: Payer: Self-pay | Admitting: *Deleted

## 2012-04-16 DIAGNOSIS — G4700 Insomnia, unspecified: Secondary | ICD-10-CM

## 2012-04-16 MED ORDER — ZOLPIDEM TARTRATE 10 MG PO TABS: 10.0000 mg | ORAL_TABLET | Freq: Every evening | ORAL | Status: DC | PRN

## 2012-04-16 NOTE — Telephone Encounter (Signed)
Called into pharmacy

## 2012-04-16 NOTE — Telephone Encounter (Signed)
Also needs refill on Hydrocodone/Acetaminophen 5-325 #30  - last refill 03/25/12

## 2012-04-16 NOTE — Telephone Encounter (Signed)
Rx called in to pharmacy. 

## 2012-04-23 ENCOUNTER — Other Ambulatory Visit (INDEPENDENT_AMBULATORY_CARE_PROVIDER_SITE_OTHER): Payer: Medicare Other

## 2012-04-23 DIAGNOSIS — D509 Iron deficiency anemia, unspecified: Secondary | ICD-10-CM | POA: Diagnosis not present

## 2012-04-23 LAB — POC HEMOCCULT BLD/STL (HOME/3-CARD/SCREEN)
Card #2 Fecal Occult Blod, POC: NEGATIVE
Card #3 Fecal Occult Blood, POC: NEGATIVE

## 2012-04-29 ENCOUNTER — Other Ambulatory Visit: Payer: Self-pay | Admitting: Internal Medicine

## 2012-04-29 DIAGNOSIS — M549 Dorsalgia, unspecified: Secondary | ICD-10-CM

## 2012-04-30 ENCOUNTER — Telehealth: Payer: Self-pay | Admitting: *Deleted

## 2012-04-30 NOTE — Telephone Encounter (Signed)
Pt called and is asking for a refill on Augmentin.  She received # 21 on 6/25 for.  She got better but now rt ear is swollen, and pain to rt  Cheek area. She is also asking for an increase in pain med, hydrocodone 5/325 bid.  She would like to increase to TID.  She has a f/u appointment with you on 8/12  Pt # [phone]

## 2012-04-30 NOTE — Telephone Encounter (Signed)
Pt given appointment for tomorrow for evaluation. vicodin #10 called in for pt.

## 2012-04-30 NOTE — Telephone Encounter (Signed)
I called the patient back- she reports having rhinorrhea, denies any fever but complains of swollen right ear and painful right cheek . I am concerned about swollen right ear and cheek which could be the beginning of bacterial sinusitis  Plan: She was advised to continue steam inhalation, gargles, nasonex.   She was advised to come to the clinic for evaluation if her symptoms do not get better or get worse..   She has been complaining of  Increasing back pain and requested to increase her hydrocodone. I will give her some extra pills till her clinic appointment with me in August 2013/. I will give her 10 additional pills.   Thanks, [employer]

## 2012-05-01 ENCOUNTER — Ambulatory Visit (INDEPENDENT_AMBULATORY_CARE_PROVIDER_SITE_OTHER): Payer: Medicare Other | Admitting: Internal Medicine

## 2012-05-01 ENCOUNTER — Encounter: Payer: Self-pay | Admitting: Internal Medicine

## 2012-05-01 VITALS — BP 116/63 | HR 94 | Temp 97.7°F | Ht 63.0 in | Wt 281.0 lb

## 2012-05-01 DIAGNOSIS — I10 Essential (primary) hypertension: Secondary | ICD-10-CM

## 2012-05-01 DIAGNOSIS — R1902 Left upper quadrant abdominal swelling, mass and lump: Secondary | ICD-10-CM | POA: Diagnosis not present

## 2012-05-01 DIAGNOSIS — J019 Acute sinusitis, unspecified: Secondary | ICD-10-CM | POA: Diagnosis not present

## 2012-05-01 DIAGNOSIS — E119 Type 2 diabetes mellitus without complications: Secondary | ICD-10-CM

## 2012-05-01 DIAGNOSIS — D171 Benign lipomatous neoplasm of skin and subcutaneous tissue of trunk: Secondary | ICD-10-CM | POA: Insufficient documentation

## 2012-05-01 DIAGNOSIS — L219 Seborrheic dermatitis, unspecified: Secondary | ICD-10-CM | POA: Diagnosis not present

## 2012-05-01 DIAGNOSIS — H9209 Otalgia, unspecified ear: Secondary | ICD-10-CM

## 2012-05-01 DIAGNOSIS — E162 Hypoglycemia, unspecified: Secondary | ICD-10-CM | POA: Insufficient documentation

## 2012-05-01 DIAGNOSIS — R109 Unspecified abdominal pain: Secondary | ICD-10-CM

## 2012-05-01 DIAGNOSIS — H9201 Otalgia, right ear: Secondary | ICD-10-CM

## 2012-05-01 LAB — GLUCOSE, CAPILLARY: Glucose-Capillary: 207 mg/dL — ABNORMAL HIGH (ref 70–99)

## 2012-05-01 MED ORDER — CIPROFLOXACIN-DEXAMETHASONE 0.3-0.1 % OT SUSP: 4.0000 [drp] | Freq: Two times a day (BID) | OTIC | Status: DC

## 2012-05-01 NOTE — Assessment & Plan Note (Signed)
sx'matic hypoglycemia last night fsbs 65 today fsbs 207 Pt reports compliance with DM meds but she reports she did not eat dinner last night HA1C 7.7 Meter reading range from 65-347 Pt to cont. DM meds no changes today

## 2012-05-01 NOTE — Assessment & Plan Note (Signed)
LUQ pain and TTP assoc with approx 2 cm superficial nodule. This has been there x 2 years or more but pt just remembered to disc Etiology could be lipoma vs other Will refere to [place] [facility] for US of the area to determine a cause of superfical pain

## 2012-05-01 NOTE — Progress Notes (Signed)
Subjective:    Patient ID: [name] Rice, female    DOB: July 01, 1956, 56 y.o.   MRN: [id number]  HPI Comments: 56 y.o woman significant PMH presents to clinic for:  Right ear pain today 1-2/10. Pt stated this pain started Sat prior to visit and was assoc with right ear swelling which has improved since Sunday and on today's visit. The patients right ear pain has also improved today.  She report the pain radiated to her right sinuses.  She just  Completed 7 days of Augmentin for rhinosinusitis (hx chronic sinusitis).  She noticed the right ear pain after completion of the antibiotic and wanted to make sure her ear was ok.  She reports she had decreased hearing in her right ear now improved.  She previously had to hold her right ear open with her finger because of the swelling. She noted drainage when doing this that "felt like water". She denies drainage today.  She also reported that her ear was painful to touch on or lay on today.  She reports her ears get soaked in shower b/c she has to use as shower chair.    Pt c/o itching to b/l ears x a long time.  She previously used drops to her ears which helped.    Pt brought her meter readings in today.  Last night reported episode of sx'matic sweating when fsbs was 65.  She reports she did not eat dinner.  She tx'ed herself by eating cereal and frosting.  She reports this is why her fsbs is elevated today.    Pt c/o LUQ pain x years worse with laying and touching the area in her LUQ.  She has not reported this to a provider before but has noted it previously.     Diabetes She presents for her follow-up diabetic visit. She has type 2 diabetes mellitus. Her disease course has been stable. Hypoglycemia symptoms include sweats. Pertinent negatives for hypoglycemia include no headaches. Pertinent negatives for diabetes include no chest pain. Diabetic complications include peripheral neuropathy. Risk factors for coronary artery disease include hypertension,  obesity, sedentary lifestyle and diabetes mellitus. Current diabetic treatment includes oral agent (monotherapy) and insulin injections.      Review of Systems  Constitutional: Negative for fever and chills.       +hot flashes  HENT: Positive for rhinorrhea and postnasal drip.        +Watery eyes +ears itching +cheesy yellow d/c from ears Resolved h/a  Eyes: Negative for visual disturbance.  Respiratory: Negative for shortness of breath.   Cardiovascular: Negative for chest pain.  Gastrointestinal:       Denies ab pain Resolved diarrhea  Musculoskeletal: Positive for back pain.  Neurological: Negative for headaches.       Objective:   Physical Exam  Nursing note and vitals reviewed. Constitutional: She is oriented to person, place, and time. Vital signs are normal. She appears well-developed and well-nourished. She is cooperative. No distress.       Pleasant Morbidly obese Walks with walker  HENT:  Head: Normocephalic and atraumatic.  Right Ear: Tympanic membrane, external ear and ear canal normal. No drainage, swelling or tenderness. Decreased hearing is noted.  Left Ear: Hearing, tympanic membrane, external ear and ear canal normal. No drainage, swelling or tenderness. No decreased hearing is noted.  Mouth/Throat: Oropharynx is clear and moist and mucous membranes are normal. No oropharyngeal exudate.       Cerumen noted b/l No evidence of erythema or pain on  otic exam, no drainage noted b/l  Scaling noted in ears b/l   Eyes: Conjunctivae are normal. Pupils are equal, round, and reactive to light. No scleral icterus.  Cardiovascular: Normal rate, regular rhythm, S1 normal, S2 normal and normal heart sounds.   No murmur heard. Pulmonary/Chest: Effort normal and breath sounds normal. No respiratory distress. She has no wheezes.    Abdominal: Soft. Normal appearance and bowel sounds are normal. She exhibits no distension. There is tenderness in the left upper quadrant.          Morbidly obese ab  Musculoskeletal:       No edema noted lower ext b/l   Lymphadenopathy:       Head (right side): No submandibular, no preauricular, no posterior auricular and no occipital adenopathy present.       Head (left side): No submental, no submandibular, no tonsillar, no preauricular, no posterior auricular and no occipital adenopathy present.    She has no cervical adenopathy.       No adenopathy noted on exam  Neurological: She is alert and oriented to person, place, and time.       Pt walks with walker 5/5 upper strength b/l   Skin: Skin is warm, dry and intact.  Psychiatric: She has a normal mood and affect. Her speech is normal and behavior is normal.          Assessment & Plan:  F/u scheduled appt 05/2012

## 2012-05-01 NOTE — Assessment & Plan Note (Signed)
Resolved today Last night fsbs was 65 d/t not eating Pt will monitor fsbs

## 2012-05-01 NOTE — Assessment & Plan Note (Signed)
Exam wnl today, no erythema (external), no drainage, nl appearing tTM Appears resolving so will not tx Pt advised to RTC if worsening

## 2012-05-01 NOTE — Assessment & Plan Note (Signed)
Pt has h/o seb derm to face, ears Scaling to ears today Disc with pharmacist Ciprodex ears drops short term for itching to ears.

## 2012-05-01 NOTE — Assessment & Plan Note (Signed)
BP at goal today 116/63 Cont meds

## 2012-05-01 NOTE — Assessment & Plan Note (Signed)
Pt just completed 7 d dose Augmentin and feels better but has +postnasal drip, watery eyes, runny nose

## 2012-05-02 ENCOUNTER — Other Ambulatory Visit: Payer: Self-pay | Admitting: Internal Medicine

## 2012-05-02 DIAGNOSIS — M549 Dorsalgia, unspecified: Secondary | ICD-10-CM

## 2012-05-02 MED ORDER — HYDROCODONE-ACETAMINOPHEN 5-325 MG PO TABS: 1.0000 | ORAL_TABLET | Freq: Four times a day (QID) | ORAL | Status: DC | PRN

## 2012-05-03 NOTE — Telephone Encounter (Signed)
I already gave Inocencio Homes a script to call in for - 60 tablets.  Thanks,  [employer]

## 2012-05-07 ENCOUNTER — Telehealth: Payer: Self-pay | Admitting: *Deleted

## 2012-05-07 DIAGNOSIS — H9201 Otalgia, right ear: Secondary | ICD-10-CM

## 2012-05-07 NOTE — Telephone Encounter (Signed)
Call from pt needs a prescription redone stating the Vicodin that she was given recently #10 is in addition to the original Vicodin.  Insurance will not pay if this is done written on the prescription.  Pt also said that the ear drops she was prescribed are not covered by her insurance .  Polymycin, Neomycin and Hydrcortisone combination.  Pt uses the [facility] on [address] and [address].  Angelina Ok, RN 05/07/2012 2:41 PM.

## 2012-05-08 MED ORDER — NEOMYCIN-POLYMYXIN-HC 3.5-10000-1 OT SOLN: 3.0000 [drp] | Freq: Four times a day (QID) | OTIC | Status: DC

## 2012-05-08 NOTE — Telephone Encounter (Signed)
Patient was recently seen in the clinic for her right ear pain and was given ciprodex  Ear drops.  She called in saying that these are not covered by her insurance. They  were changed to neomycin- polymyxin- hydrocortisone and prescription was sent to the pharmacy.

## 2012-05-09 MED ORDER — HYDROCODONE-ACETAMINOPHEN 5-325 MG PO TABS: 1.0000 | ORAL_TABLET | Freq: Four times a day (QID) | ORAL | Status: AC | PRN

## 2012-05-09 NOTE — Telephone Encounter (Signed)
Patient was given a prescription of 10 additional vicodin on 7/17 but she could not refill that. Patient was requesting additional vicodin for worsening back pain.  I will give her a total of 20 vicodin.  Thank you, Nasiya Pascual

## 2012-05-11 NOTE — Progress Notes (Signed)
I saw, examined, and discussed the patient with Dr McLean and agree with the note contained here.  

## 2012-05-14 ENCOUNTER — Ambulatory Visit (HOSPITAL_COMMUNITY): Admission: RE | Admit: 2012-05-14 | Payer: Medicare Other | Source: Ambulatory Visit

## 2012-05-16 ENCOUNTER — Other Ambulatory Visit: Payer: Self-pay | Admitting: Internal Medicine

## 2012-05-28 ENCOUNTER — Ambulatory Visit (INDEPENDENT_AMBULATORY_CARE_PROVIDER_SITE_OTHER): Payer: Medicare Other | Admitting: Surgery

## 2012-05-28 ENCOUNTER — Encounter: Payer: Medicare Other | Admitting: Internal Medicine

## 2012-05-31 ENCOUNTER — Other Ambulatory Visit: Payer: Self-pay | Admitting: Internal Medicine

## 2012-05-31 DIAGNOSIS — M549 Dorsalgia, unspecified: Secondary | ICD-10-CM

## 2012-05-31 DIAGNOSIS — G4700 Insomnia, unspecified: Secondary | ICD-10-CM

## 2012-06-01 NOTE — Telephone Encounter (Signed)
Called to pharm 

## 2012-06-05 ENCOUNTER — Other Ambulatory Visit: Payer: Self-pay | Admitting: Internal Medicine

## 2012-06-05 ENCOUNTER — Encounter (INDEPENDENT_AMBULATORY_CARE_PROVIDER_SITE_OTHER): Payer: Self-pay | Admitting: Surgery

## 2012-06-05 ENCOUNTER — Ambulatory Visit (INDEPENDENT_AMBULATORY_CARE_PROVIDER_SITE_OTHER): Payer: Medicare Other | Admitting: Surgery

## 2012-06-05 VITALS — BP 128/89 | HR 78 | Temp 97.3°F | Resp 18 | Ht 63.0 in | Wt 282.2 lb

## 2012-06-05 DIAGNOSIS — R1902 Left upper quadrant abdominal swelling, mass and lump: Secondary | ICD-10-CM | POA: Diagnosis not present

## 2012-06-05 DIAGNOSIS — Z9109 Other allergy status, other than to drugs and biological substances: Secondary | ICD-10-CM

## 2012-06-05 NOTE — Progress Notes (Signed)
Patient ID: [name] Rice, female   DOB: 1955/11/03, 56 y.o.   MRN: [id number]  Chief Complaint  Patient presents with  . Other    abdominal wall mass    HPI [name] Rice is a 56 y.o. female.   HPI This is a pleasant female with multiple medical problems who was referred by Dr. Shirlee Latch for evaluation of left-sided abdominal pain and an abdominal wall mass. She reports it has been there for 2 or 3 years but is now getting larger and causing her to have increasing pain. It hurts a lot at night when she lays on her side. She also has a pulling sensation. The pain is moderate and sharp. It is not refer any where else Past Medical History  Diagnosis Date  . Anxiety   . Arthritis   . Asthma   . COPD (chronic obstructive pulmonary disease)   . Depression   . Diabetes mellitus   . GERD (gastroesophageal reflux disease)   . Hypertension   . HLD (hyperlipidemia)   . OSA on CPAP 2008  . Dizziness   . History of colonic polyps   . Lumbar pain 2011    per x-ray - Multilevel spondylosis  . Cervical dystonia   . Tremor CCENTRAL NERVOUS SYSTEM  . Torn rotator cuff RT SHOULDER  . Dermatitis   . Diarrhea   . History of kidney stones   . Sleep apnea     USES C-PAP  . Recurrent boils     "SLUSTER BOILS" ON ABD AND LABIA  . History of seborrheic dermatitis     face,ears    Past Surgical History  Procedure Date  . Pilonidal cyst excision 4098,1191  . Tubal ligation   . Lumbar laminectomy   . Carpal tunnel release 2006    left  . Elbow surgery LEFT  . Knee arthroscopy LEFT  . Nasal sinus surgery   . Hand surgery RIGHT    Family History  Problem Relation Age of Onset  . Heart disease Brother   . Stroke Neg Hx   . Cancer Neg Hx     Social History History  Substance Use Topics  . Smoking status: Former Games developer  . Smokeless tobacco: Former Neurosurgeon    Quit date: 07/08/1997  . Alcohol Use: No    Allergies  Allergen Reactions  . Ace Inhibitors   . Cefaclor     REACTION: hives    . Shrimp (Shellfish Allergy) Nausea And Vomiting  . Sulfonamide Derivatives     REACTION: rash    Current Outpatient Prescriptions  Medication Sig Dispense Refill  . albuterol (VENTOLIN HFA) 108 (90 BASE) MCG/ACT inhaler Inhale 2 puffs into the lungs every 6 (six) hours as needed. Wheezing       . allopurinol (ZYLOPRIM) 300 MG tablet TAKE 1 TABLET BY MOUTH DAILY  90 tablet  0  . B-D INS SYR ULTRAFINE 1CC/30G 30G X 1/2" 1 ML MISC USE AS DIRECTED WITH EVERY MEAL AND AT BEDTIME  100 each  2  . budesonide-formoterol (SYMBICORT) 160-4.5 MCG/ACT inhaler Inhale 2 puffs into the lungs 2 (two) times daily.  1 Inhaler  12  . docusate sodium (COLACE) 100 MG capsule Take 1 capsule (100 mg total) by mouth 2 (two) times daily.  60 capsule  1  . DULoxetine (CYMBALTA) 20 MG capsule Take One Capsule by Mouth Every Day with 60mg  Capsules.  30 capsule  3  . DULoxetine (CYMBALTA) 60 MG capsule Take 1 capsule (60 mg  total) by mouth daily.  30 capsule  3  . ferrous sulfate 325 (65 FE) MG tablet Take 1 tablet (325 mg total) by mouth daily.  30 tablet  3  . furosemide (LASIX) 40 MG tablet TAKE 1 TABLET BY MOUTH DAILY  90 tablet  0  . glucose blood (ONE TOUCH ULTRA TEST) test strip 1 each by Other route 3 (three) times daily before meals. Use as instructed  100 each  12  . HYDROcodone-acetaminophen (NORCO/VICODIN) 5-325 MG per tablet TAKE 1 TABLET BY MOUTH EVERY DAY  30 tablet  1  . ibuprofen (ADVIL) 200 MG tablet Take 1 tablet (200 mg total) by mouth every 8 (eight) hours as needed for pain.  30 tablet  2  . ibuprofen (ADVIL,MOTRIN) 200 MG tablet Take 400 mg by mouth every 6 (six) hours as needed. Pain        . insulin aspart (NOVOLOG) 100 UNIT/ML injection Inject 80 Units into the skin 3 (three) times daily before meals.      . insulin glargine (LANTUS) 100 UNIT/ML injection Inject 60 Units into the skin 2 (two) times daily.      Marland Kitchen LANTUS 100 UNIT/ML injection INJECT 120 UNITS INTO THE SKIN DAILY WITH BREAKFAST   40 mL  0  . LYRICA 100 MG capsule TAKE 1 CAPSULE BY MOUTH TWICE DAILY  60 capsule  1  . meclizine (ANTIVERT) 25 MG tablet TAKE 1 TABLET BY MOUTH THREE TIMES DAILY FOR DIZZINESS OR NAUSEA  30 tablet  0  . metFORMIN (GLUCOPHAGE) 1000 MG tablet Take 1 tablet (1,000 mg total) by mouth 2 (two) times daily with a meal.  180 tablet  3  . methocarbamol (ROBAXIN) 500 MG tablet Take 500 mg by mouth 2 (two) times daily.       . mometasone (NASONEX) 50 MCG/ACT nasal spray Place 2 sprays into the nose daily.  17 g  1  . montelukast (SINGULAIR) 10 MG tablet Take 1 tablet (10 mg total) by mouth at bedtime.  96 tablet  2  . olopatadine (PATANOL) 0.1 % ophthalmic solution Place 1 drop into both eyes 2 (two) times daily as needed. Itching eyes  5 mL  2  . ONETOUCH DELICA LANCETS FINE MISC 1 each by Does not apply route 4 (four) times daily.  200 each  1  . pantoprazole (PROTONIX) 40 MG tablet Take 1 tablet (40 mg total) by mouth daily.  90 tablet  3  . pravastatin (PRAVACHOL) 40 MG tablet Take 1 tablet (40 mg total) by mouth daily.  90 tablet  3  . propranolol (INDERAL) 20 MG tablet Take 1 tablet (20 mg total) by mouth 2 (two) times daily.  60 tablet  11  . selenium sulfide (SELSUN BLUE) 1 % LOTN Apply 1 application topically daily.  1 Bottle  2  . Syringe/Needle, Disp, (B-D ECLIPSE SYRINGE) 30G X 1/2" 1 ML MISC 1 Syringe by Does not apply route 3 (three) times daily.        Marland Kitchen zolpidem (AMBIEN) 10 MG tablet TAKE 1 TABLET BY MOUTH AT BEDTIME AS NEEDED FOR SLEEP  30 tablet  0    Review of Systems Review of Systems  Constitutional: Positive for fatigue. Negative for fever and unexpected weight change.  HENT: Negative.   Eyes: Negative.   Respiratory: Positive for apnea and shortness of breath.   Cardiovascular: Positive for chest pain and leg swelling.  Gastrointestinal: Positive for abdominal pain. Negative for nausea and vomiting.  Genitourinary:  Negative.   Musculoskeletal: Positive for back pain, joint  swelling, arthralgias and gait problem.  Neurological: Positive for tremors and weakness.  Hematological: Bruises/bleeds easily.  Psychiatric/Behavioral: Negative.     Blood pressure 128/89, pulse 78, temperature 97.3 F (36.3 C), temperature source Temporal, resp. rate 18, height 5\' 3"  (1.6 m), weight 282 lb 3.2 oz (128.005 kg), last menstrual period 10/18/1995.  Physical Exam Physical Exam  Constitutional: She is oriented to person, place, and time.  HENT:  Head: Normocephalic and atraumatic.  Right Ear: External ear normal.  Left Ear: External ear normal.  Nose: Nose normal.  Mouth/Throat: Oropharynx is clear and moist. No oropharyngeal exudate.  Eyes: Conjunctivae and EOM are normal. Pupils are equal, round, and reactive to light. Right eye exhibits no discharge. Left eye exhibits no discharge. No scleral icterus.  Neck: Normal range of motion. Neck supple. No tracheal deviation present. No thyromegaly present.  Cardiovascular: Normal rate, regular rhythm, normal heart sounds and intact distal pulses.   No murmur heard. Pulmonary/Chest: Effort normal and breath sounds normal. No respiratory distress. She has no wheezes.  Abdominal: Soft. She exhibits no distension.       Abdomen is morbidly obese. There is a 2 cm nodule deep in the subcutaneous tissue along the left costal margin.  It is tender to examination. It is mobile. There are no skin changes  Musculoskeletal: She exhibits edema and tenderness.  Lymphadenopathy:    She has no cervical adenopathy.  Neurological: She is alert and oriented to person, place, and time.  Skin: Skin is warm and dry. No rash noted. She is not diaphoretic. No erythema.  Psychiatric: Her behavior is normal. Judgment normal.    Data Reviewed   Assessment    2 cm left abdominal wall mass of uncertain etiology    Plan    This may represent a lipoma although I cannot rule out a desmoid tumor. Because it is so symptomatic and getting larger,  removal is recommended for symptomatology and to rule out malignancy. I discussed this with her in detail. I discussed the risks of surgery which includes but is not limited to bleeding, infection, injury to surrounding structures, seroma formation, recurrence, need for further surgery, et Karie Soda. She understands and wishes to proceed. Likelihood of success is good       Azari Hasler A 06/05/2012, 11:20 AM

## 2012-06-09 ENCOUNTER — Other Ambulatory Visit: Payer: Self-pay | Admitting: Internal Medicine

## 2012-06-09 DIAGNOSIS — M549 Dorsalgia, unspecified: Secondary | ICD-10-CM

## 2012-06-13 NOTE — Telephone Encounter (Signed)
Called to pharm 

## 2012-06-14 ENCOUNTER — Encounter (HOSPITAL_COMMUNITY): Payer: Self-pay | Admitting: Pharmacy Technician

## 2012-06-15 ENCOUNTER — Other Ambulatory Visit: Payer: Self-pay | Admitting: Internal Medicine

## 2012-06-20 ENCOUNTER — Telehealth: Payer: Self-pay | Admitting: *Deleted

## 2012-06-20 NOTE — Telephone Encounter (Signed)
Return call from patient.  She stated that MD changed her Advair to Symbicort back around [id number] because her insurance would no longer pay for the Advair.  States Symbicort irritates her throat and not controlling her asthma as well as the Advair did.  I will attempt to obtain prior authorization with MD approval. Kingsley Spittle Cassady9/4/201311:31 AM   Insurance ph# [phone] Pt ID# [id number]

## 2012-06-20 NOTE — Telephone Encounter (Signed)
I would be more than happy to work on prior authorization for Advair.

## 2012-06-20 NOTE — Telephone Encounter (Signed)
Received PA request from pt's pharmacy [facility] [id number].  Request is for Advair Diskus 250/50 inhale one puff by mouth twice daily, last filled on 12/15/2011.  Medication is no longer on pt's medication list.  Attempted to contact pt to see if she was still taking, no answer, message left.   Will also forward to PCP for clarification.  If pt is to be on this medication, then MD will have to complete a prior authorization request.Rice, [name] Cassady9/4/20139:37 AM

## 2012-06-25 ENCOUNTER — Encounter (HOSPITAL_COMMUNITY): Payer: Self-pay

## 2012-06-25 ENCOUNTER — Encounter (HOSPITAL_COMMUNITY)
Admission: RE | Admit: 2012-06-25 | Discharge: 2012-06-25 | Disposition: A | Discharge status: Home / Self Care | Payer: Medicare Other | Source: Ambulatory Visit | Attending: Surgery | Admitting: Surgery

## 2012-06-25 DIAGNOSIS — E119 Type 2 diabetes mellitus without complications: Secondary | ICD-10-CM | POA: Diagnosis not present

## 2012-06-25 DIAGNOSIS — Z01812 Encounter for preprocedural laboratory examination: Secondary | ICD-10-CM | POA: Diagnosis not present

## 2012-06-25 DIAGNOSIS — I10 Essential (primary) hypertension: Secondary | ICD-10-CM | POA: Diagnosis not present

## 2012-06-25 DIAGNOSIS — G4730 Sleep apnea, unspecified: Secondary | ICD-10-CM | POA: Diagnosis not present

## 2012-06-25 DIAGNOSIS — Z794 Long term (current) use of insulin: Secondary | ICD-10-CM | POA: Diagnosis not present

## 2012-06-25 DIAGNOSIS — K219 Gastro-esophageal reflux disease without esophagitis: Secondary | ICD-10-CM | POA: Diagnosis not present

## 2012-06-25 DIAGNOSIS — D1739 Benign lipomatous neoplasm of skin and subcutaneous tissue of other sites: Secondary | ICD-10-CM | POA: Diagnosis not present

## 2012-06-25 LAB — CBC
HCT: 36.6 % (ref 36.0–46.0)
MCH: 23.7 pg — ABNORMAL LOW (ref 26.0–34.0)
MCHC: 30.3 g/dL (ref 30.0–36.0)
RDW: 17.7 % — ABNORMAL HIGH (ref 11.5–15.5)

## 2012-06-25 LAB — SURGICAL PCR SCREEN
MRSA, PCR: NEGATIVE
Staphylococcus aureus: POSITIVE — AB

## 2012-06-25 LAB — BASIC METABOLIC PANEL
BUN: 16 mg/dL (ref 6–23)
Chloride: 99 mEq/L (ref 96–112)
Creatinine, Ser: 0.9 mg/dL (ref 0.50–1.10)
GFR calc Af Amer: 80 mL/min — ABNORMAL LOW (ref >90)
Glucose, Bld: 208 mg/dL — ABNORMAL HIGH (ref 70–99)

## 2012-06-25 NOTE — Telephone Encounter (Signed)
I will continue to follow along.  Thanks, [employer]

## 2012-06-25 NOTE — Pre-Procedure Instructions (Signed)
20 CYNCERE RUHE  06/25/2012   Your procedure is scheduled on:  06/28/12  Report to [facility] at 1030 AM.  Call this number if you have problems the morning of surgery: [phone]   Remember:   Do not eat food:After Midnight.      Take these medicines the morning of surgery with A SIP OF WATER: all inhalers,cymbalta,vicodan,protonix,,inderal,singular   Do not wear jewelry, make-up or nail polish.  Do not wear lotions, powders, or perfumes. You may wear deodorant.  Do not shave 48 hours prior to surgery. Men may shave face and neck.  Do not bring valuables to the hospital.  Contacts, dentures or bridgework may not be worn into surgery.  Leave suitcase in the car. After surgery it may be brought to your room.  For patients admitted to the hospital, checkout time is 11:00 AM the day of discharge.   Patients discharged the day of surgery will not be allowed to drive home.  Name and phone number of your driver: family  Special Instructions: CHG Shower Use Special Wash: 1/2 bottle night before surgery and 1/2 bottle morning of surgery.   Please read over the following fact sheets that you were given: Pain Booklet, Coughing and Deep Breathing, MRSA Information and Surgical Site Infection Prevention

## 2012-06-25 NOTE — Telephone Encounter (Signed)
Contacted insurance company was redirected to ph# [phone]. Pt has copd and asthma.  Currently has albuterol inhaler, symbicort failed to treat symptoms.  Insurance will send a faxed denial/approval in the next 72 hours.   Case ID # [id number].[name] Spittle Cassady9/9/20131:28 PM

## 2012-06-25 NOTE — Progress Notes (Signed)
Echo in epic.  Stress test done in 06

## 2012-06-26 ENCOUNTER — Other Ambulatory Visit: Payer: Self-pay | Admitting: Internal Medicine

## 2012-06-27 MED ORDER — CIPROFLOXACIN IN D5W 400 MG/200ML IV SOLN
400.0000 mg | INTRAVENOUS | Status: AC
  Administered 2012-06-28: 400 mg via INTRAVENOUS
  Filled 2012-06-27: qty 200

## 2012-06-27 NOTE — H&P (Signed)
Patient ID: [name] Rice, female DOB: 1956/07/28, 56 y.o. MRN: [id number]  Chief Complaint   Patient presents with   .  Other     abdominal wall mass    HPI  [name] is a 56 y.o. female.  HPI  This is a pleasant female with multiple medical problems who was referred by Dr. Shirlee Latch for evaluation of left-sided abdominal pain and an abdominal wall mass. She reports it has been there for 2 or 3 years but is now getting larger and causing her to have increasing pain. It hurts a lot at night when she lays on her side. She also has a pulling sensation. The pain is moderate and sharp. It is not refer any where else  Past Medical History   Diagnosis  Date   .  Anxiety    .  Arthritis    .  Asthma    .  COPD (chronic obstructive pulmonary disease)    .  Depression    .  Diabetes mellitus    .  GERD (gastroesophageal reflux disease)    .  Hypertension    .  HLD (hyperlipidemia)    .  OSA on CPAP  2008   .  Dizziness    .  History of colonic polyps    .  Lumbar pain  2011     per x-ray - Multilevel spondylosis   .  Cervical dystonia    .  Tremor  CCENTRAL NERVOUS SYSTEM   .  Torn rotator cuff  RT SHOULDER   .  Dermatitis    .  Diarrhea    .  History of kidney stones    .  Sleep apnea      USES C-PAP   .  Recurrent boils      "SLUSTER BOILS" ON ABD AND LABIA   .  History of seborrheic dermatitis      face,ears    Past Surgical History   Procedure  Date   .  Pilonidal cyst excision  4098,1191   .  Tubal ligation    .  Lumbar laminectomy    .  Carpal tunnel release  2006     left   .  Elbow surgery  LEFT   .  Knee arthroscopy  LEFT   .  Nasal sinus surgery    .  Hand surgery  RIGHT    Family History   Problem  Relation  Age of Onset   .  Heart disease  Brother    .  Stroke  Neg Hx    .  Cancer  Neg Hx     Social History  History   Substance Use Topics   .  Smoking status:  Former Games developer   .  Smokeless tobacco:  Former Neurosurgeon     Quit date:  07/08/1997   .   Alcohol Use:  No    Allergies   Allergen  Reactions   .  Ace Inhibitors    .  Cefaclor      REACTION: hives   .  Shrimp (Shellfish Allergy)  Nausea And Vomiting   .  Sulfonamide Derivatives      REACTION: rash    Current Outpatient Prescriptions   Medication  Sig  Dispense  Refill   .  albuterol (VENTOLIN HFA) 108 (90 BASE) MCG/ACT inhaler  Inhale 2 puffs into the lungs every 6 (six) hours as needed. Wheezing     .  allopurinol (  ZYLOPRIM) 300 MG tablet  TAKE 1 TABLET BY MOUTH DAILY  90 tablet  0   .  B-D INS SYR ULTRAFINE 1CC/30G 30G X 1/2" 1 ML MISC  USE AS DIRECTED WITH EVERY MEAL AND AT BEDTIME  100 each  2   .  budesonide-formoterol (SYMBICORT) 160-4.5 MCG/ACT inhaler  Inhale 2 puffs into the lungs 2 (two) times daily.  1 Inhaler  12   .  docusate sodium (COLACE) 100 MG capsule  Take 1 capsule (100 mg total) by mouth 2 (two) times daily.  60 capsule  1   .  DULoxetine (CYMBALTA) 20 MG capsule  Take One Capsule by Mouth Every Day with 60mg  Capsules.  30 capsule  3   .  DULoxetine (CYMBALTA) 60 MG capsule  Take 1 capsule (60 mg total) by mouth daily.  30 capsule  3   .  ferrous sulfate 325 (65 FE) MG tablet  Take 1 tablet (325 mg total) by mouth daily.  30 tablet  3   .  furosemide (LASIX) 40 MG tablet  TAKE 1 TABLET BY MOUTH DAILY  90 tablet  0   .  glucose blood (ONE TOUCH ULTRA TEST) test strip  1 each by Other route 3 (three) times daily before meals. Use as instructed  100 each  12   .  HYDROcodone-acetaminophen (NORCO/VICODIN) 5-325 MG per tablet  TAKE 1 TABLET BY MOUTH EVERY DAY  30 tablet  1   .  ibuprofen (ADVIL) 200 MG tablet  Take 1 tablet (200 mg total) by mouth every 8 (eight) hours as needed for pain.  30 tablet  2   .  ibuprofen (ADVIL,MOTRIN) 200 MG tablet  Take 400 mg by mouth every 6 (six) hours as needed. Pain     .  insulin aspart (NOVOLOG) 100 UNIT/ML injection  Inject 80 Units into the skin 3 (three) times daily before meals.     .  insulin glargine (LANTUS) 100  UNIT/ML injection  Inject 60 Units into the skin 2 (two) times daily.     Marland Kitchen  LANTUS 100 UNIT/ML injection  INJECT 120 UNITS INTO THE SKIN DAILY WITH BREAKFAST  40 mL  0   .  LYRICA 100 MG capsule  TAKE 1 CAPSULE BY MOUTH TWICE DAILY  60 capsule  1   .  meclizine (ANTIVERT) 25 MG tablet  TAKE 1 TABLET BY MOUTH THREE TIMES DAILY FOR DIZZINESS OR NAUSEA  30 tablet  0   .  metFORMIN (GLUCOPHAGE) 1000 MG tablet  Take 1 tablet (1,000 mg total) by mouth 2 (two) times daily with a meal.  180 tablet  3   .  methocarbamol (ROBAXIN) 500 MG tablet  Take 500 mg by mouth 2 (two) times daily.     .  mometasone (NASONEX) 50 MCG/ACT nasal spray  Place 2 sprays into the nose daily.  17 g  1   .  montelukast (SINGULAIR) 10 MG tablet  Take 1 tablet (10 mg total) by mouth at bedtime.  96 tablet  2   .  olopatadine (PATANOL) 0.1 % ophthalmic solution  Place 1 drop into both eyes 2 (two) times daily as needed. Itching eyes  5 mL  2   .  ONETOUCH DELICA LANCETS FINE MISC  1 each by Does not apply route 4 (four) times daily.  200 each  1   .  pantoprazole (PROTONIX) 40 MG tablet  Take 1 tablet (40 mg total) by mouth daily.  90 tablet  3   .  pravastatin (PRAVACHOL) 40 MG tablet  Take 1 tablet (40 mg total) by mouth daily.  90 tablet  3   .  propranolol (INDERAL) 20 MG tablet  Take 1 tablet (20 mg total) by mouth 2 (two) times daily.  60 tablet  11   .  selenium sulfide (SELSUN BLUE) 1 % LOTN  Apply 1 application topically daily.  1 Bottle  2   .  Syringe/Needle, Disp, (B-D ECLIPSE SYRINGE) 30G X 1/2" 1 ML MISC  1 Syringe by Does not apply route 3 (three) times daily.     Marland Kitchen  zolpidem (AMBIEN) 10 MG tablet  TAKE 1 TABLET BY MOUTH AT BEDTIME AS NEEDED FOR SLEEP  30 tablet  0    Review of Systems  Review of Systems  Constitutional: Positive for fatigue. Negative for fever and unexpected weight change.  HENT: Negative.  Eyes: Negative.  Respiratory: Positive for apnea and shortness of breath.  Cardiovascular: Positive for  chest pain and leg swelling.  Gastrointestinal: Positive for abdominal pain. Negative for nausea and vomiting.  Genitourinary: Negative.  Musculoskeletal: Positive for back pain, joint swelling, arthralgias and gait problem.  Neurological: Positive for tremors and weakness.  Hematological: Bruises/bleeds easily.  Psychiatric/Behavioral: Negative.   Blood pressure 128/89, pulse 78, temperature 97.3 F (36.3 C), temperature source Temporal, resp. rate 18, height 5\' 3"  (1.6 m), weight 282 lb 3.2 oz (128.005 kg), last menstrual period 10/18/1995.  Physical Exam  Physical Exam  Constitutional: She is oriented to person, place, and time.  HENT:  Head: Normocephalic and atraumatic.  Right Ear: External ear normal.  Left Ear: External ear normal.  Nose: Nose normal.  Mouth/Throat: Oropharynx is clear and moist. No oropharyngeal exudate.  Eyes: Conjunctivae and EOM are normal. Pupils are equal, round, and reactive to light. Right eye exhibits no discharge. Left eye exhibits no discharge. No scleral icterus.  Neck: Normal range of motion. Neck supple. No tracheal deviation present. No thyromegaly present.  Cardiovascular: Normal rate, regular rhythm, normal heart sounds and intact distal pulses.  No murmur heard.  Pulmonary/Chest: Effort normal and breath sounds normal. No respiratory distress. She has no wheezes.  Abdominal: Soft. She exhibits no distension.  Abdomen is morbidly obese. There is a 2 cm nodule deep in the subcutaneous tissue along the left costal margin. It is tender to examination. It is mobile. There are no skin changes  Musculoskeletal: She exhibits edema and tenderness.  Lymphadenopathy:  She has no cervical adenopathy.  Neurological: She is alert and oriented to person, place, and time.  Skin: Skin is warm and dry. No rash noted. She is not diaphoretic. No erythema.  Psychiatric: Her behavior is normal. Judgment normal.   Data Reviewed  Assessment   2 cm left abdominal  wall mass of uncertain etiology   Plan   This may represent a lipoma although I cannot rule out a desmoid tumor. Because it is so symptomatic and getting larger, removal is recommended for symptomatology and to rule out malignancy. I discussed this with her in detail. I discussed the risks of surgery which includes but is not limited to bleeding, infection, injury to surrounding structures, seroma formation, recurrence, need for further surgery, et Karie Soda. She understands and wishes to proceed. Likelihood of success is good   Heber Hoog A

## 2012-06-28 ENCOUNTER — Encounter (HOSPITAL_COMMUNITY): Payer: Self-pay | Admitting: *Deleted

## 2012-06-28 ENCOUNTER — Encounter (HOSPITAL_COMMUNITY)
Admission: RE | Disposition: A | Discharge status: Home / Self Care | Payer: Self-pay | Source: Ambulatory Visit | Attending: Surgery

## 2012-06-28 ENCOUNTER — Encounter (HOSPITAL_COMMUNITY): Payer: Self-pay | Admitting: Anesthesiology

## 2012-06-28 ENCOUNTER — Ambulatory Visit (HOSPITAL_COMMUNITY): Payer: Medicare Other | Admitting: Anesthesiology

## 2012-06-28 ENCOUNTER — Ambulatory Visit (HOSPITAL_COMMUNITY)
Admission: RE | Admit: 2012-06-28 | Discharge: 2012-06-28 | Disposition: A | Discharge status: Home / Self Care | Payer: Medicare Other | Source: Ambulatory Visit | Attending: Surgery | Admitting: Surgery

## 2012-06-28 DIAGNOSIS — I10 Essential (primary) hypertension: Secondary | ICD-10-CM | POA: Insufficient documentation

## 2012-06-28 DIAGNOSIS — Z01812 Encounter for preprocedural laboratory examination: Secondary | ICD-10-CM | POA: Insufficient documentation

## 2012-06-28 DIAGNOSIS — D1739 Benign lipomatous neoplasm of skin and subcutaneous tissue of other sites: Secondary | ICD-10-CM | POA: Insufficient documentation

## 2012-06-28 DIAGNOSIS — R1900 Intra-abdominal and pelvic swelling, mass and lump, unspecified site: Secondary | ICD-10-CM | POA: Diagnosis not present

## 2012-06-28 DIAGNOSIS — D179 Benign lipomatous neoplasm, unspecified: Secondary | ICD-10-CM | POA: Diagnosis not present

## 2012-06-28 DIAGNOSIS — G4730 Sleep apnea, unspecified: Secondary | ICD-10-CM | POA: Insufficient documentation

## 2012-06-28 DIAGNOSIS — E119 Type 2 diabetes mellitus without complications: Secondary | ICD-10-CM | POA: Insufficient documentation

## 2012-06-28 DIAGNOSIS — K219 Gastro-esophageal reflux disease without esophagitis: Secondary | ICD-10-CM | POA: Insufficient documentation

## 2012-06-28 DIAGNOSIS — Z794 Long term (current) use of insulin: Secondary | ICD-10-CM | POA: Insufficient documentation

## 2012-06-28 HISTORY — PX: MASS EXCISION: SHX2000

## 2012-06-28 SURGERY — EXCISION MASS
Anesthesia: Monitor Anesthesia Care | Site: Abdomen | Laterality: Left | Wound class: Clean

## 2012-06-28 MED ORDER — LIDOCAINE HCL 1 % IJ SOLN
INTRAMUSCULAR | Status: DC | PRN
  Administered 2012-06-28: 30 mg via INTRADERMAL

## 2012-06-28 MED ORDER — LACTATED RINGERS IV SOLN
INTRAVENOUS | Status: DC | PRN
  Administered 2012-06-28: 11:00:00 via INTRAVENOUS

## 2012-06-28 MED ORDER — HYDROCODONE-ACETAMINOPHEN 5-325 MG PO TABS: 1.0000 | ORAL_TABLET | ORAL | Status: AC | PRN

## 2012-06-28 MED ORDER — MIDAZOLAM HCL 5 MG/5ML IJ SOLN
INTRAMUSCULAR | Status: DC | PRN
  Administered 2012-06-28: 2 mg via INTRAVENOUS

## 2012-06-28 MED ORDER — PROPOFOL 10 MG/ML IV BOLUS
INTRAVENOUS | Status: DC | PRN
  Administered 2012-06-28: 2 mg via INTRAVENOUS

## 2012-06-28 MED ORDER — FENTANYL CITRATE 0.05 MG/ML IJ SOLN
INTRAMUSCULAR | Status: DC | PRN
  Administered 2012-06-28 (×2): 50 ug via INTRAVENOUS
  Administered 2012-06-28: 100 ug via INTRAVENOUS

## 2012-06-28 MED ORDER — LACTATED RINGERS IV SOLN: INTRAVENOUS | Status: DC

## 2012-06-28 MED ORDER — HYDROMORPHONE HCL PF 1 MG/ML IJ SOLN
INTRAMUSCULAR | Status: AC
  Filled 2012-06-28: qty 1

## 2012-06-28 MED ORDER — PROPOFOL 10 MG/ML IV EMUL
INTRAVENOUS | Status: DC | PRN
  Administered 2012-06-28: 75 ug/kg/min via INTRAVENOUS

## 2012-06-28 MED ORDER — HYDROMORPHONE HCL PF 1 MG/ML IJ SOLN
0.2500 mg | INTRAMUSCULAR | Status: DC | PRN
  Administered 2012-06-28: 0.5 mg via INTRAVENOUS

## 2012-06-28 MED ORDER — 0.9 % SODIUM CHLORIDE (POUR BTL) OPTIME
TOPICAL | Status: DC | PRN
  Administered 2012-06-28: 1000 mL

## 2012-06-28 MED ORDER — ONDANSETRON HCL 4 MG/2ML IJ SOLN
INTRAMUSCULAR | Status: DC | PRN
  Administered 2012-06-28: 4 mg via INTRAVENOUS

## 2012-06-28 MED ORDER — PROMETHAZINE HCL 25 MG/ML IJ SOLN: 6.2500 mg | INTRAMUSCULAR | Status: DC | PRN

## 2012-06-28 MED ORDER — LIDOCAINE-EPINEPHRINE (PF) 1 %-1:200000 IJ SOLN
INTRAMUSCULAR | Status: AC
  Filled 2012-06-28: qty 10

## 2012-06-28 MED ORDER — BUPIVACAINE-EPINEPHRINE PF 0.25-1:200000 % IJ SOLN
INTRAMUSCULAR | Status: AC
  Filled 2012-06-28: qty 30

## 2012-06-28 MED ORDER — LIDOCAINE-EPINEPHRINE (PF) 1 %-1:200000 IJ SOLN
INTRAMUSCULAR | Status: DC | PRN
  Administered 2012-06-28: 24 mL

## 2012-06-28 SURGICAL SUPPLY — 42 items
APL SKNCLS STERI-STRIP NONHPOA (GAUZE/BANDAGES/DRESSINGS) ×1
BENZOIN TINCTURE PRP APPL 2/3 (GAUZE/BANDAGES/DRESSINGS) ×2 IMPLANT
BLADE SURG 10 STRL SS (BLADE) ×2 IMPLANT
BLADE SURG 15 STRL LF DISP TIS (BLADE) ×1 IMPLANT
BLADE SURG 15 STRL SS (BLADE) ×2
CANISTER SUCTION 2500CC (MISCELLANEOUS) IMPLANT
CLOTH BEACON ORANGE TIMEOUT ST (SAFETY) ×2 IMPLANT
CLSR STERI-STRIP ANTIMIC 1/2X4 (GAUZE/BANDAGES/DRESSINGS) ×1 IMPLANT
COVER SURGICAL LIGHT HANDLE (MISCELLANEOUS) ×2 IMPLANT
DECANTER SPIKE VIAL GLASS SM (MISCELLANEOUS) ×2 IMPLANT
DRAPE LAPAROSCOPIC ABDOMINAL (DRAPES) IMPLANT
DRAPE PED LAPAROTOMY (DRAPES) ×1 IMPLANT
ELECT CAUTERY BLADE 6.4 (BLADE) ×2 IMPLANT
ELECT REM PT RETURN 9FT ADLT (ELECTROSURGICAL) ×2
ELECTRODE REM PT RTRN 9FT ADLT (ELECTROSURGICAL) ×1 IMPLANT
GLOVE BIOGEL PI IND STRL 6 (GLOVE) IMPLANT
GLOVE BIOGEL PI INDICATOR 6 (GLOVE) ×2
GLOVE SURG SIGNA 7.5 PF LTX (GLOVE) ×2 IMPLANT
GOWN PREVENTION PLUS XLARGE (GOWN DISPOSABLE) ×2 IMPLANT
GOWN STRL NON-REIN LRG LVL3 (GOWN DISPOSABLE) ×3 IMPLANT
KIT BASIN OR (CUSTOM PROCEDURE TRAY) ×2 IMPLANT
KIT ROOM TURNOVER OR (KITS) ×2 IMPLANT
NDL HYPO 25X1 1.5 SAFETY (NEEDLE) ×1 IMPLANT
NEEDLE HYPO 25X1 1.5 SAFETY (NEEDLE) ×2 IMPLANT
NS IRRIG 1000ML POUR BTL (IV SOLUTION) ×2 IMPLANT
PACK SURGICAL SETUP 50X90 (CUSTOM PROCEDURE TRAY) ×2 IMPLANT
PAD ARMBOARD 7.5X6 YLW CONV (MISCELLANEOUS) ×4 IMPLANT
PENCIL BUTTON HOLSTER BLD 10FT (ELECTRODE) ×2 IMPLANT
SPECIMEN JAR SMALL (MISCELLANEOUS) ×2 IMPLANT
SPONGE GAUZE 4X4 12PLY (GAUZE/BANDAGES/DRESSINGS) ×1 IMPLANT
SPONGE LAP 18X18 X RAY DECT (DISPOSABLE) ×2 IMPLANT
STRIP CLOSURE SKIN 1/2X4 (GAUZE/BANDAGES/DRESSINGS) ×1 IMPLANT
SUT MNCRL AB 4-0 PS2 18 (SUTURE) ×2 IMPLANT
SUT VIC AB 3-0 SH 27 (SUTURE) ×2
SUT VIC AB 3-0 SH 27XBRD (SUTURE) ×1 IMPLANT
SYR BULB 3OZ (MISCELLANEOUS) ×2 IMPLANT
SYR CONTROL 10ML LL (SYRINGE) ×2 IMPLANT
TAPE CLOTH SURG 4X10 WHT LF (GAUZE/BANDAGES/DRESSINGS) ×1 IMPLANT
TOWEL OR 17X24 6PK STRL BLUE (TOWEL DISPOSABLE) ×2 IMPLANT
TOWEL OR 17X26 10 PK STRL BLUE (TOWEL DISPOSABLE) ×2 IMPLANT
TUBE CONNECTING 12X1/4 (SUCTIONS) ×1 IMPLANT
YANKAUER SUCT BULB TIP NO VENT (SUCTIONS) ×1 IMPLANT

## 2012-06-28 NOTE — Anesthesia Postprocedure Evaluation (Signed)
  Anesthesia Post-op Note  Patient: [name] Rice  Procedure(s) Performed: Procedure(s) (LRB) with comments: EXCISION MASS (Left) - excision left upper quadrant abdominal wall mass  Patient Location: PACU  Anesthesia Type: MAC  Level of Consciousness: awake  Airway and Oxygen Therapy: Patient Spontanous Breathing  Post-op Pain: mild  Post-op Assessment: Post-op Vital signs reviewed, Patient's Cardiovascular Status Stable, Respiratory Function Stable, Patent Airway, No signs of Nausea or vomiting and Pain level controlled  Post-op Vital Signs: stable  Complications: No apparent anesthesia complications

## 2012-06-28 NOTE — Transfer of Care (Signed)
Immediate Anesthesia Transfer of Care Note  Patient: [name] Rice  Procedure(s) Performed: Procedure(s) (LRB) with comments: EXCISION MASS (Left) - excision left upper quadrant abdominal wall mass  Patient Location: PACU  Anesthesia Type: MAC  Level of Consciousness: awake, alert , oriented and patient cooperative  Airway & Oxygen Therapy: Patient Spontanous Breathing and Patient connected to nasal cannula oxygen  Post-op Assessment: Report given to PACU RN and Post -op Vital signs reviewed and stable  Post vital signs: Reviewed and stable  Complications: No apparent anesthesia complications

## 2012-06-28 NOTE — Anesthesia Procedure Notes (Signed)
Procedure Name: MAC Date/Time: 06/28/2012 11:05 AM Performed by: Leona Singleton A Pre-anesthesia Checklist: Patient identified Patient Re-evaluated:Patient Re-evaluated prior to inductionOxygen Delivery Method: Circle system utilized Preoxygenation: Pre-oxygenation with 100% oxygen Intubation Type: IV induction Comments: Native airway; pt tolerate well

## 2012-06-28 NOTE — Interval H&P Note (Signed)
History and Physical Interval Note:  No change in H and P  06/28/2012 9:46 AM  [name] Rice  has presented today for surgery, with the diagnosis of abdominal wall mass  The various methods of treatment have been discussed with the patient and family. After consideration of risks, benefits and other options for treatment, the patient has consented to  Procedure(s) (LRB) with comments: EXCISION MASS (Left) - excision left upper quadrant abdominal wall mass as a surgical intervention .  The patient's history has been reviewed, patient examined, no change in status, stable for surgery.  I have reviewed the patient's chart and labs.  Questions were answered to the patient's satisfaction.     Ola Raap A

## 2012-06-28 NOTE — Anesthesia Preprocedure Evaluation (Addendum)
Anesthesia Evaluation  Patient identified by MRN, date of birth, ID band Patient awake    Reviewed: Allergy & Precautions, H&P , NPO status , Patient's Chart, lab work & pertinent test results  History of Anesthesia Complications Negative for: history of anesthetic complications  Airway Mallampati: IV TM Distance: >3 FB Neck ROM: Limited    Dental  (+) Teeth Intact and Dental Advisory Given   Pulmonary shortness of breath, with exertion, at rest and lying, asthma , sleep apnea and Continuous Positive Airway Pressure Ventilation , COPD COPD inhaler, former smoker,    + decreased breath sounds      Cardiovascular Exercise Tolerance: Poor hypertension, Pt. on medications + DOE Rhythm:Regular Rate:Normal  Study Conclusions   1. Left ventricle: The cavity size was normal. Systolic function was     normal. The estimated ejection fraction was in the range of 55%     to 60%. Doppler parameters are consistent with abnormal left     ventricular relaxation (grade 1 diastolic dysfunction).  2. Left atrium: The atrium was mildly dilated.  Impressions:     Neuro/Psych PSYCHIATRIC DISORDERS Anxiety Depression  Neuromuscular disease (shoulder pain)    GI/Hepatic Neg liver ROS, GERD-  Medicated and Poorly Controlled,  Endo/Other  diabetes, Well Controlled, Type 2, Insulin Dependent and Oral Hypoglycemic AgentsMorbid obesityGout  Renal/GU negative Renal ROS     Musculoskeletal  (+) Arthritis -, Osteoarthritis,    Abdominal (+) + obese,   Peds  Hematology negative hematology ROS (+)   Anesthesia Other Findings   Reproductive/Obstetrics                        Anesthesia Physical Anesthesia Plan  ASA: III  Anesthesia Plan: MAC   Post-op Pain Management:    Induction: Intravenous  Airway Management Planned: Simple Face Mask  Additional Equipment:   Intra-op Plan:   Post-operative Plan:   Informed  Consent: I have reviewed the patients History and Physical, chart, labs and discussed the procedure including the risks, benefits and alternatives for the proposed anesthesia with the patient or authorized representative who has indicated his/her understanding and acceptance.     Plan Discussed with: CRNA and Surgeon  Anesthesia Plan Comments:         Anesthesia Quick Evaluation

## 2012-06-28 NOTE — Op Note (Signed)
EXCISION MASS  Procedure Note  [name] Rice 06/28/2012   Pre-op Diagnosis: Left upper quadarant abdominal wall mass     Post-op Diagnosis: same  Procedure(s): EXCISION MASS (2cm) LEFT UPPER QUADRANT ABDOMINAL WALL  Surgeon(s): Shelly Rubenstein, MD  Anesthesia: Monitor Anesthesia Care  Staff:  Verta Ellen, RN - Circulator Amy Alric Ran, CST - Scrub Person Christella Scheuermann, RN - Circulator Chasity Golda Acre, RN - Circulator  Estimated Blood Loss: Minimal               Procedure: The patient was brought to the operating room and identified as the correct patient. She was placed on the operating room table and anesthesia was induced. Her abdomen was prepped and draped in the usual sterile fashion. I anesthetize the skin the left upper quadrant over the palpable mass an area of tenderness with lidocaine. An incision with a scalpel to this down to the fatty tissue. The patient has an indurated appearing fat as well as lipoma was excised altogether with the electrocautery. This was approximately 2-3 cm in size. It was sent to pathology for evaluation. I extensively palpated the rest of the left upper quadrant and found no other abnormalities or hernias. I anesthetize the area further with lidocaine. I irrigated with saline. I then closed subcutaneous tissue with interrupted 3-0 Vicryl sutures and closed the skin with a running 4-0 Monocryl. Steri-Strips, gauze, and tape were applied. The patient tolerated the procedure well. All the counts were correct at the end of the procedure. The patient was then taken in a stable condition to the recovery room.          Amr Sturtevant A   Date: 06/28/2012  Time: 11:38 AM

## 2012-06-28 NOTE — Preoperative (Addendum)
Beta Blockers   Reason not to administer Beta Blockers inderal 06/28/12 0800

## 2012-06-29 ENCOUNTER — Other Ambulatory Visit: Payer: Self-pay | Admitting: Internal Medicine

## 2012-06-29 DIAGNOSIS — M109 Gout, unspecified: Secondary | ICD-10-CM

## 2012-07-02 ENCOUNTER — Encounter (HOSPITAL_COMMUNITY): Payer: Self-pay | Admitting: Surgery

## 2012-07-04 ENCOUNTER — Telehealth (INDEPENDENT_AMBULATORY_CARE_PROVIDER_SITE_OTHER): Payer: Self-pay | Admitting: General Surgery

## 2012-07-04 NOTE — Telephone Encounter (Signed)
Pt calling to report slight swelling noted after she removed the steri-strips.  She states Dr. Magnus Ivan had warned her of possible swelling from fluid accumulation at the incision.  She is not sure if there is swelling or this is just the normal puffiness expected from irritation.  Agreed to watch site for next day or so and will call back prn.

## 2012-07-05 NOTE — Telephone Encounter (Signed)
Call made to pt's insurance to find out the status of the prior authorization, authorization was approved until 10/16/2012.[name] Spittle Cassady9/19/20131:04 PM

## 2012-07-09 DIAGNOSIS — G243 Spasmodic torticollis: Secondary | ICD-10-CM | POA: Diagnosis not present

## 2012-07-12 ENCOUNTER — Ambulatory Visit: Payer: Medicare Other | Admitting: Internal Medicine

## 2012-07-14 ENCOUNTER — Other Ambulatory Visit: Payer: Self-pay | Admitting: Internal Medicine

## 2012-07-14 DIAGNOSIS — G4700 Insomnia, unspecified: Secondary | ICD-10-CM

## 2012-07-16 MED ORDER — ZOLPIDEM TARTRATE 5 MG PO TABS: 5.0000 mg | ORAL_TABLET | Freq: Every evening | ORAL | Status: DC | PRN

## 2012-07-16 NOTE — Telephone Encounter (Signed)
Lyrica and Ambien rxs called to [facility].

## 2012-07-17 ENCOUNTER — Encounter (INDEPENDENT_AMBULATORY_CARE_PROVIDER_SITE_OTHER): Payer: Self-pay | Admitting: Surgery

## 2012-07-17 ENCOUNTER — Ambulatory Visit (INDEPENDENT_AMBULATORY_CARE_PROVIDER_SITE_OTHER): Payer: Medicare Other | Admitting: Surgery

## 2012-07-17 VITALS — BP 140/76 | HR 78 | Temp 98.6°F | Resp 18 | Ht 63.0 in | Wt 280.2 lb

## 2012-07-17 DIAGNOSIS — Z09 Encounter for follow-up examination after completed treatment for conditions other than malignant neoplasm: Secondary | ICD-10-CM

## 2012-07-17 NOTE — Progress Notes (Signed)
Subjective:     Patient ID: [name] Rice, female   DOB: 25-Jul-1956, 56 y.o.   MRN: [id number]  HPI She is here for her first postop visit status post excision of an abdominal wall lipoma in the left upper quadrant She is doing well and has no complaints. Review of Systems     Objective:   Physical Exam Her incision is healing well. The final pathology showed a lipoma with no evidence of malignancy    Assessment:     Patient stable status post excision of abdominal wall lipoma    Plan:     She may resume normal activity. I will see her back as needed

## 2012-08-07 ENCOUNTER — Other Ambulatory Visit: Payer: Self-pay | Admitting: Internal Medicine

## 2012-08-10 ENCOUNTER — Other Ambulatory Visit: Payer: Self-pay | Admitting: Internal Medicine

## 2012-08-10 DIAGNOSIS — M549 Dorsalgia, unspecified: Secondary | ICD-10-CM

## 2012-08-10 DIAGNOSIS — G4700 Insomnia, unspecified: Secondary | ICD-10-CM

## 2012-08-13 NOTE — Telephone Encounter (Signed)
I called the patient and she is requesting lyrica for back pain. She feels her pain is sciatica.  She has an appt with me on 11/4 when I would do a thorough check up.  Thanks, [employer]

## 2012-08-13 NOTE — Telephone Encounter (Signed)
Called to pharm 

## 2012-08-20 ENCOUNTER — Ambulatory Visit (INDEPENDENT_AMBULATORY_CARE_PROVIDER_SITE_OTHER): Payer: Medicare Other | Admitting: Internal Medicine

## 2012-08-20 ENCOUNTER — Encounter: Payer: Self-pay | Admitting: Internal Medicine

## 2012-08-20 VITALS — BP 172/77 | HR 77 | Temp 97.0°F | Ht 63.0 in | Wt 288.1 lb

## 2012-08-20 DIAGNOSIS — E119 Type 2 diabetes mellitus without complications: Secondary | ICD-10-CM

## 2012-08-20 DIAGNOSIS — F341 Dysthymic disorder: Secondary | ICD-10-CM | POA: Diagnosis not present

## 2012-08-20 DIAGNOSIS — G629 Polyneuropathy, unspecified: Secondary | ICD-10-CM

## 2012-08-20 DIAGNOSIS — E785 Hyperlipidemia, unspecified: Secondary | ICD-10-CM | POA: Diagnosis not present

## 2012-08-20 DIAGNOSIS — D509 Iron deficiency anemia, unspecified: Secondary | ICD-10-CM

## 2012-08-20 DIAGNOSIS — B373 Candidiasis of vulva and vagina: Secondary | ICD-10-CM | POA: Diagnosis not present

## 2012-08-20 DIAGNOSIS — J449 Chronic obstructive pulmonary disease, unspecified: Secondary | ICD-10-CM | POA: Diagnosis not present

## 2012-08-20 DIAGNOSIS — M549 Dorsalgia, unspecified: Secondary | ICD-10-CM

## 2012-08-20 DIAGNOSIS — F418 Other specified anxiety disorders: Secondary | ICD-10-CM

## 2012-08-20 DIAGNOSIS — D1739 Benign lipomatous neoplasm of skin and subcutaneous tissue of other sites: Secondary | ICD-10-CM

## 2012-08-20 DIAGNOSIS — G589 Mononeuropathy, unspecified: Secondary | ICD-10-CM | POA: Diagnosis not present

## 2012-08-20 DIAGNOSIS — B379 Candidiasis, unspecified: Secondary | ICD-10-CM

## 2012-08-20 DIAGNOSIS — B3731 Acute candidiasis of vulva and vagina: Secondary | ICD-10-CM | POA: Diagnosis not present

## 2012-08-20 DIAGNOSIS — IMO0002 Reserved for concepts with insufficient information to code with codable children: Secondary | ICD-10-CM

## 2012-08-20 DIAGNOSIS — H8110 Benign paroxysmal vertigo, unspecified ear: Secondary | ICD-10-CM

## 2012-08-20 DIAGNOSIS — D171 Benign lipomatous neoplasm of skin and subcutaneous tissue of trunk: Secondary | ICD-10-CM

## 2012-08-20 LAB — CBC WITH DIFFERENTIAL/PLATELET
Eosinophils Relative: 3 % (ref 0–5)
HCT: 34.6 % — ABNORMAL LOW (ref 36.0–46.0)
Hemoglobin: 11.2 g/dL — ABNORMAL LOW (ref 12.0–15.0)
Lymphocytes Relative: 19 % (ref 12–46)
MCHC: 32.4 g/dL (ref 30.0–36.0)
MCV: 75.9 fL — ABNORMAL LOW (ref 78.0–100.0)
Monocytes Absolute: 0.6 10*3/uL (ref 0.1–1.0)
Monocytes Relative: 7 % (ref 3–12)
Neutro Abs: 5.9 10*3/uL (ref 1.7–7.7)
RDW: 17.5 % — ABNORMAL HIGH (ref 11.5–15.5)
WBC: 8.4 10*3/uL (ref 4.0–10.5)

## 2012-08-20 LAB — POCT GLYCOSYLATED HEMOGLOBIN (HGB A1C): Hemoglobin A1C: 6.8

## 2012-08-20 MED ORDER — HYDROCODONE-ACETAMINOPHEN 5-325 MG PO TABS: 1.0000 | ORAL_TABLET | Freq: Four times a day (QID) | ORAL | Status: DC | PRN

## 2012-08-20 MED ORDER — GABAPENTIN 300 MG PO CAPS: 300.0000 mg | ORAL_CAPSULE | Freq: Three times a day (TID) | ORAL | Status: DC

## 2012-08-20 MED ORDER — FLUCONAZOLE 150 MG PO TABS: ORAL_TABLET | ORAL | Status: DC

## 2012-08-20 NOTE — Progress Notes (Signed)
  Subjective:    Patient ID: [name] Rice, female    DOB: [birth year], 56 y.o.   MRN: [id number]  HPI: 56 year old woman with past medical history significant for type 2 diabetes mellitus, morbid obesity, hypertension, essential tremors comes to the clinic for a followup visit.  DM: She reports having some hypoglycemic events -2 or 3 times in the past week. One time her blood sugar was 51 and she almost passed out. Other times her blood sugars ranged in 60's-70's. She states that she ran out of her food towards the end of this month but she was taking the same amount of insulin. She herself decreased her Lantus from 80 twice a day to 60 twice a day and also and reduced her NovoLog from 80 , three times a day to 60, three times a day. She also reports having increased burning pain in her bilateral lower extremities for last few months.  Back pain: She reports recent worsening of her chronic low back pain( more on the right than left). Denies any radiation to the legs. States that in the beginning of the last week, her pain was radiating to her flanks. Denies any dysuria, urgency or frequency.  Status post lipoma removal: She continues to have some abdominal discomfort post surgery.  BPPV: She thinks that her vertigo episodes are becoming more frequent lately. She has not been doing the exercises taught by vestibular rehabilitation because of her back pain.  Vaginal discharge: She has noticed some thick white curdy discharge for last few days. She denies any itching associated with it. Reports that she has been on a lot of antibiotics prior to surgery and for her ear infections recently.   Review of Systems  Constitutional: Negative for fever and appetite change.  HENT: Negative for rhinorrhea and postnasal drip.   Respiratory: Negative for cough, chest tightness and wheezing.   Cardiovascular: Negative for chest pain and palpitations.  Musculoskeletal: Positive for back pain.  Neurological:  Negative for dizziness, numbness and headaches.  Hematological: Negative for adenopathy.  Psychiatric/Behavioral: Negative for agitation.       Objective:   Physical Exam  Constitutional: She is oriented to person, place, and time. She appears well-developed and well-nourished. No distress.  HENT:  Head: Normocephalic and atraumatic.  Mouth/Throat: No oropharyngeal exudate.  Eyes: Conjunctivae normal are normal. Pupils are equal, round, and reactive to light.  Neck: Normal range of motion. Neck supple. No JVD present. No tracheal deviation present. No thyromegaly present.  Cardiovascular: Normal rate and regular rhythm.   Pulmonary/Chest: Effort normal and breath sounds normal. No respiratory distress. She has no wheezes. She has no rales.  Abdominal: Bowel sounds are normal. She exhibits no distension. There is no tenderness. There is no rebound.  Musculoskeletal: Normal range of motion. She exhibits no edema and no tenderness.  Lymphadenopathy:    She has no cervical adenopathy.  Neurological: She is alert and oriented to person, place, and time. She has normal reflexes. No cranial nerve deficit. Coordination normal.  Skin: Skin is warm. She is not diaphoretic.          Assessment & Plan:

## 2012-08-20 NOTE — Patient Instructions (Addendum)
Please schedule a follow up appointment in 1 month . Please bring your medication bottles with your next appointment. Please take your medicines as prescribed. I will call you with your lab results if anything will be abnormal. 

## 2012-08-21 LAB — LIPID PANEL
Cholesterol: 139 mg/dL (ref 0–200)
HDL: 38 mg/dL — ABNORMAL LOW
LDL Cholesterol: 66 mg/dL (ref 0–99)
Total CHOL/HDL Ratio: 3.7 ratio
Triglycerides: 174 mg/dL — ABNORMAL HIGH
VLDL: 35 mg/dL (ref 0–40)

## 2012-08-21 LAB — COMPLETE METABOLIC PANEL WITH GFR
AST: 24 U/L (ref 0–37)
Albumin: 3.8 g/dL (ref 3.5–5.2)
Alkaline Phosphatase: 78 U/L (ref 39–117)
GFR, Est Non African American: 72 mL/min
Glucose, Bld: 165 mg/dL — ABNORMAL HIGH (ref 70–99)
Potassium: 3.8 mEq/L (ref 3.5–5.3)
Sodium: 142 mEq/L (ref 135–145)
Total Bilirubin: 0.4 mg/dL (ref 0.3–1.2)
Total Protein: 6.4 g/dL (ref 6.0–8.3)

## 2012-08-22 DIAGNOSIS — B3731 Acute candidiasis of vulva and vagina: Secondary | ICD-10-CM | POA: Insufficient documentation

## 2012-08-22 DIAGNOSIS — H2510 Age-related nuclear cataract, unspecified eye: Secondary | ICD-10-CM | POA: Diagnosis not present

## 2012-08-22 DIAGNOSIS — E119 Type 2 diabetes mellitus without complications: Secondary | ICD-10-CM | POA: Diagnosis not present

## 2012-08-22 DIAGNOSIS — H1045 Other chronic allergic conjunctivitis: Secondary | ICD-10-CM | POA: Diagnosis not present

## 2012-08-22 DIAGNOSIS — B373 Candidiasis of vulva and vagina: Secondary | ICD-10-CM | POA: Insufficient documentation

## 2012-08-22 NOTE — Assessment & Plan Note (Signed)
Check lipids and CMET.  Update: Lipid Panel     Component Value Date/Time   CHOL 139 08/20/2012 1651   TRIG 174* 08/20/2012 1651   HDL 38* 08/20/2012 1651   CHOLHDL 3.7 08/20/2012 1651   VLDL 35 08/20/2012 1651   LDLCALC 66 08/20/2012 1651   Her Low HDL was noted. Continue current treatment.

## 2012-08-22 NOTE — Assessment & Plan Note (Addendum)
Lab Results  Component Value Date   HGBA1C 6.8 08/20/2012   HGBA1C 7.2 09/07/2010   CREATININE 0.89 08/20/2012   CREATININE 0.90 06/25/2012   MICROALBUR 0.57 04/04/2011   MICRALBCREAT 15.5 04/04/2011   CHOL 139 08/20/2012   HDL 38* 08/20/2012   TRIG 174* 08/20/2012    Last eye exam and foot exam: No results found for this basename: HMDIABEYEEXA, HMDIABFOOTEX    Assessment: She had 2-3 episodes of hypoglycemia towards end of October when she ran out of her for. She was strongly advised not to take her insulin when she skips her meals. Diabetes control: controlled Progress toward goals: at goal Barriers to meeting goals: no barriers identified  Plan: Diabetes treatment: Agree with decreasing her Lantus from 80 units twice a day to 60 units twice a day. Also agree with decreasing the dose of NovoLog from 80- 3 times a day to 60- 3 times a day. Refer to: none Instruction/counseling given: reminded to bring blood glucose meter & log to each visit, reminded to bring medications to each visit, discussed foot care, discussed the need for weight loss and discussed diet   Diabetic Neuropathy: He reports some burning pain in her bilateral lower extremities likely consistent with diabetic neuropathy. She has tried Neurontin in the past but didn't help her. She is willing to try that again.  -Give her a trial of Neurontin. Consider switching to amitriptyline if Neurontin fails.

## 2012-08-22 NOTE — Assessment & Plan Note (Signed)
She reports worsening of her chronic back pain. Denies any radiation to the legs. Her lumbar spine x-ray was reviewed that showed some osteophytes and degenerative disc disease.  -Refer her to physical therapy. - Discussed diet and weight reduction. -She was advised to take opiod pain medicines only limited amounts when absolutely needed. She is getting 30 tablets of Vicodin on monthly basis. She did not request to go up on the medicine. In fact she thought that her dystonia or essential tremor is getting masked with pain medicines and she herself requested to get limited amounts of opiates.

## 2012-08-22 NOTE — Assessment & Plan Note (Signed)
She complains of thick white discharge for last few days in the setting of being recently finishing her antibiotic course. -Give her 2 tablets of fluconazole(single dose on first day and repeat in 3 days if her symptoms persist)

## 2012-08-22 NOTE — Assessment & Plan Note (Signed)
She lost her ex husband lately. Was disturbed not depressed. Continue current treatment.

## 2012-08-22 NOTE — Assessment & Plan Note (Signed)
She reports return of episodes of vertigo.  -She was encouraged to do the exercises taught by vestibular rehabilitation. -PRN meclizine

## 2012-08-31 ENCOUNTER — Telehealth: Payer: Self-pay | Admitting: *Deleted

## 2012-08-31 DIAGNOSIS — G629 Polyneuropathy, unspecified: Secondary | ICD-10-CM

## 2012-08-31 MED ORDER — GABAPENTIN 300 MG PO CAPS: 300.0000 mg | ORAL_CAPSULE | Freq: Three times a day (TID) | ORAL | Status: DC

## 2012-08-31 NOTE — Telephone Encounter (Signed)
I apologize for the confusion but she was instructed to take 1 pill at night for 1 week  Followed by 2 pills at night.   I tried calling the patient and she is taking 1 pill BID and would like to increase it to TID. Would make appropriate changes in the prescription.  [name] Rice

## 2012-08-31 NOTE — Telephone Encounter (Signed)
Pt calls and states she got only #60 tabs of gabapentin, directions state that she is to increase to 3 times daily after initial dosing. Please advise

## 2012-09-03 ENCOUNTER — Other Ambulatory Visit: Payer: Self-pay | Admitting: Internal Medicine

## 2012-09-04 ENCOUNTER — Ambulatory Visit: Payer: Medicare Other | Attending: Internal Medicine

## 2012-09-04 DIAGNOSIS — R5381 Other malaise: Secondary | ICD-10-CM | POA: Diagnosis not present

## 2012-09-04 DIAGNOSIS — IMO0001 Reserved for inherently not codable concepts without codable children: Secondary | ICD-10-CM | POA: Insufficient documentation

## 2012-09-04 DIAGNOSIS — M545 Low back pain, unspecified: Secondary | ICD-10-CM | POA: Insufficient documentation

## 2012-09-04 DIAGNOSIS — M2560 Stiffness of unspecified joint, not elsewhere classified: Secondary | ICD-10-CM | POA: Insufficient documentation

## 2012-09-11 ENCOUNTER — Ambulatory Visit: Payer: Medicare Other

## 2012-09-14 ENCOUNTER — Emergency Department (HOSPITAL_COMMUNITY): Payer: Medicare Other

## 2012-09-14 ENCOUNTER — Encounter (HOSPITAL_COMMUNITY): Payer: Self-pay | Admitting: Emergency Medicine

## 2012-09-14 ENCOUNTER — Emergency Department (HOSPITAL_COMMUNITY)
Admission: EM | Admit: 2012-09-14 | Discharge: 2012-09-14 | Disposition: A | Discharge status: Home / Self Care | Payer: Medicare Other | Source: Home / Self Care | Attending: Emergency Medicine | Admitting: Emergency Medicine

## 2012-09-14 DIAGNOSIS — Z87891 Personal history of nicotine dependence: Secondary | ICD-10-CM | POA: Insufficient documentation

## 2012-09-14 DIAGNOSIS — E785 Hyperlipidemia, unspecified: Secondary | ICD-10-CM | POA: Insufficient documentation

## 2012-09-14 DIAGNOSIS — Z85828 Personal history of other malignant neoplasm of skin: Secondary | ICD-10-CM | POA: Insufficient documentation

## 2012-09-14 DIAGNOSIS — J4489 Other specified chronic obstructive pulmonary disease: Secondary | ICD-10-CM | POA: Insufficient documentation

## 2012-09-14 DIAGNOSIS — Z79899 Other long term (current) drug therapy: Secondary | ICD-10-CM | POA: Insufficient documentation

## 2012-09-14 DIAGNOSIS — Z872 Personal history of diseases of the skin and subcutaneous tissue: Secondary | ICD-10-CM | POA: Insufficient documentation

## 2012-09-14 DIAGNOSIS — F411 Generalized anxiety disorder: Secondary | ICD-10-CM | POA: Insufficient documentation

## 2012-09-14 DIAGNOSIS — Z8739 Personal history of other diseases of the musculoskeletal system and connective tissue: Secondary | ICD-10-CM | POA: Insufficient documentation

## 2012-09-14 DIAGNOSIS — F329 Major depressive disorder, single episode, unspecified: Secondary | ICD-10-CM | POA: Insufficient documentation

## 2012-09-14 DIAGNOSIS — N289 Disorder of kidney and ureter, unspecified: Secondary | ICD-10-CM | POA: Diagnosis not present

## 2012-09-14 DIAGNOSIS — K5732 Diverticulitis of large intestine without perforation or abscess without bleeding: Secondary | ICD-10-CM | POA: Diagnosis not present

## 2012-09-14 DIAGNOSIS — Z87442 Personal history of urinary calculi: Secondary | ICD-10-CM | POA: Insufficient documentation

## 2012-09-14 DIAGNOSIS — Z860100 Personal history of colon polyps, unspecified: Secondary | ICD-10-CM | POA: Insufficient documentation

## 2012-09-14 DIAGNOSIS — F3289 Other specified depressive episodes: Secondary | ICD-10-CM | POA: Insufficient documentation

## 2012-09-14 DIAGNOSIS — J449 Chronic obstructive pulmonary disease, unspecified: Secondary | ICD-10-CM | POA: Insufficient documentation

## 2012-09-14 DIAGNOSIS — Z794 Long term (current) use of insulin: Secondary | ICD-10-CM | POA: Insufficient documentation

## 2012-09-14 DIAGNOSIS — I10 Essential (primary) hypertension: Secondary | ICD-10-CM | POA: Insufficient documentation

## 2012-09-14 DIAGNOSIS — K5792 Diverticulitis of intestine, part unspecified, without perforation or abscess without bleeding: Secondary | ICD-10-CM

## 2012-09-14 DIAGNOSIS — R112 Nausea with vomiting, unspecified: Secondary | ICD-10-CM | POA: Insufficient documentation

## 2012-09-14 DIAGNOSIS — K219 Gastro-esophageal reflux disease without esophagitis: Secondary | ICD-10-CM | POA: Diagnosis not present

## 2012-09-14 DIAGNOSIS — G4730 Sleep apnea, unspecified: Secondary | ICD-10-CM | POA: Insufficient documentation

## 2012-09-14 DIAGNOSIS — M1990 Unspecified osteoarthritis, unspecified site: Secondary | ICD-10-CM | POA: Insufficient documentation

## 2012-09-14 DIAGNOSIS — E860 Dehydration: Secondary | ICD-10-CM | POA: Diagnosis not present

## 2012-09-14 DIAGNOSIS — Z8679 Personal history of other diseases of the circulatory system: Secondary | ICD-10-CM | POA: Insufficient documentation

## 2012-09-14 DIAGNOSIS — E119 Type 2 diabetes mellitus without complications: Secondary | ICD-10-CM | POA: Insufficient documentation

## 2012-09-14 LAB — COMPREHENSIVE METABOLIC PANEL
Alkaline Phosphatase: 87 U/L (ref 39–117)
BUN: 11 mg/dL (ref 6–23)
Creatinine, Ser: 0.83 mg/dL (ref 0.50–1.10)
GFR calc Af Amer: 88 mL/min — ABNORMAL LOW (ref >90)
Glucose, Bld: 129 mg/dL — ABNORMAL HIGH (ref 70–99)
Potassium: 3.3 mEq/L — ABNORMAL LOW (ref 3.5–5.1)
Total Protein: 7 g/dL (ref 6.0–8.3)

## 2012-09-14 LAB — URINALYSIS, ROUTINE W REFLEX MICROSCOPIC
Ketones, ur: 15 mg/dL — AB
Leukocytes, UA: NEGATIVE
Nitrite: NEGATIVE
Protein, ur: 30 mg/dL — AB
pH: 5.5 (ref 5.0–8.0)

## 2012-09-14 LAB — CBC WITH DIFFERENTIAL/PLATELET
Eosinophils Absolute: 0.1 10*3/uL (ref 0.0–0.7)
Eosinophils Relative: 1 % (ref 0–5)
HCT: 36.7 % (ref 36.0–46.0)
Hemoglobin: 11.1 g/dL — ABNORMAL LOW (ref 12.0–15.0)
Lymphs Abs: 1.3 10*3/uL (ref 0.7–4.0)
MCH: 24.2 pg — ABNORMAL LOW (ref 26.0–34.0)
MCV: 80 fL (ref 78.0–100.0)
Monocytes Absolute: 0.7 10*3/uL (ref 0.1–1.0)
Monocytes Relative: 6 % (ref 3–12)
RBC: 4.59 MIL/uL (ref 3.87–5.11)

## 2012-09-14 LAB — URINE MICROSCOPIC-ADD ON

## 2012-09-14 MED ORDER — SODIUM CHLORIDE 0.9 % IV BOLUS (SEPSIS)
1000.0000 mL | Freq: Once | INTRAVENOUS | Status: AC
  Administered 2012-09-14: 1000 mL via INTRAVENOUS

## 2012-09-14 MED ORDER — POTASSIUM CHLORIDE CRYS ER 20 MEQ PO TBCR
40.0000 meq | EXTENDED_RELEASE_TABLET | Freq: Two times a day (BID) | ORAL | Status: DC
  Administered 2012-09-14: 40 meq via ORAL
  Filled 2012-09-14: qty 2

## 2012-09-14 MED ORDER — CIPROFLOXACIN HCL 500 MG PO TABS: 500.0000 mg | ORAL_TABLET | Freq: Two times a day (BID) | ORAL | Status: DC

## 2012-09-14 MED ORDER — IOHEXOL 300 MG/ML  SOLN
20.0000 mL | INTRAMUSCULAR | Status: AC
  Administered 2012-09-14 (×2): 20 mL via ORAL

## 2012-09-14 MED ORDER — ONDANSETRON HCL 4 MG PO TABS: 4.0000 mg | ORAL_TABLET | Freq: Four times a day (QID) | ORAL | Status: DC

## 2012-09-14 MED ORDER — ONDANSETRON HCL 4 MG/2ML IJ SOLN
4.0000 mg | Freq: Once | INTRAMUSCULAR | Status: AC
  Administered 2012-09-14: 4 mg via INTRAVENOUS
  Filled 2012-09-14: qty 2

## 2012-09-14 MED ORDER — MORPHINE SULFATE 4 MG/ML IJ SOLN
4.0000 mg | Freq: Once | INTRAMUSCULAR | Status: AC
  Administered 2012-09-14: 4 mg via INTRAVENOUS
  Filled 2012-09-14: qty 1

## 2012-09-14 MED ORDER — OXYCODONE-ACETAMINOPHEN 5-325 MG PO TABS: 1.0000 | ORAL_TABLET | ORAL | Status: DC | PRN

## 2012-09-14 MED ORDER — METRONIDAZOLE 500 MG PO TABS: 500.0000 mg | ORAL_TABLET | Freq: Two times a day (BID) | ORAL | Status: DC

## 2012-09-14 MED ORDER — IOHEXOL 300 MG/ML  SOLN
100.0000 mL | Freq: Once | INTRAMUSCULAR | Status: AC | PRN
  Administered 2012-09-14: 100 mL via INTRAVENOUS

## 2012-09-14 NOTE — ED Notes (Signed)
Pt also complaining of frequent burping.

## 2012-09-14 NOTE — ED Notes (Signed)
Pt states she has been having LLQ abd pain since yesterday at 0550. Pt states the pain feels like "contractions" radiating to the other side of her stomach. Pt states she has not been able to urinate, Pt states every time she had a sharp pain she would have a bowel movement states it's not quite diarrhea but soft stools, the bowel movements quit after 2400. Pt c/o extreme nausea and vomiting x1. 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

## 2012-09-15 ENCOUNTER — Inpatient Hospital Stay (HOSPITAL_COMMUNITY)
Admission: EM | Admit: 2012-09-15 | Discharge: 2012-09-20 | DRG: 392 | Disposition: A | Discharge status: Home Health | Payer: Medicare Other | Attending: Internal Medicine | Admitting: Internal Medicine

## 2012-09-15 ENCOUNTER — Encounter (HOSPITAL_COMMUNITY): Payer: Self-pay | Admitting: *Deleted

## 2012-09-15 DIAGNOSIS — N182 Chronic kidney disease, stage 2 (mild): Secondary | ICD-10-CM | POA: Diagnosis not present

## 2012-09-15 DIAGNOSIS — F40298 Other specified phobia: Secondary | ICD-10-CM | POA: Diagnosis present

## 2012-09-15 DIAGNOSIS — K5792 Diverticulitis of intestine, part unspecified, without perforation or abscess without bleeding: Secondary | ICD-10-CM

## 2012-09-15 DIAGNOSIS — G4733 Obstructive sleep apnea (adult) (pediatric): Secondary | ICD-10-CM | POA: Diagnosis present

## 2012-09-15 DIAGNOSIS — E1149 Type 2 diabetes mellitus with other diabetic neurological complication: Secondary | ICD-10-CM | POA: Diagnosis present

## 2012-09-15 DIAGNOSIS — F329 Major depressive disorder, single episode, unspecified: Secondary | ICD-10-CM | POA: Diagnosis present

## 2012-09-15 DIAGNOSIS — Z823 Family history of stroke: Secondary | ICD-10-CM

## 2012-09-15 DIAGNOSIS — I152 Hypertension secondary to endocrine disorders: Secondary | ICD-10-CM | POA: Diagnosis present

## 2012-09-15 DIAGNOSIS — Z6841 Body Mass Index (BMI) 40.0 and over, adult: Secondary | ICD-10-CM | POA: Diagnosis not present

## 2012-09-15 DIAGNOSIS — J449 Chronic obstructive pulmonary disease, unspecified: Secondary | ICD-10-CM | POA: Diagnosis present

## 2012-09-15 DIAGNOSIS — I10 Essential (primary) hypertension: Secondary | ICD-10-CM

## 2012-09-15 DIAGNOSIS — R339 Retention of urine, unspecified: Secondary | ICD-10-CM | POA: Diagnosis present

## 2012-09-15 DIAGNOSIS — K219 Gastro-esophageal reflux disease without esophagitis: Secondary | ICD-10-CM | POA: Diagnosis not present

## 2012-09-15 DIAGNOSIS — N179 Acute kidney failure, unspecified: Secondary | ICD-10-CM | POA: Diagnosis not present

## 2012-09-15 DIAGNOSIS — G4700 Insomnia, unspecified: Secondary | ICD-10-CM

## 2012-09-15 DIAGNOSIS — Z87891 Personal history of nicotine dependence: Secondary | ICD-10-CM

## 2012-09-15 DIAGNOSIS — E860 Dehydration: Secondary | ICD-10-CM

## 2012-09-15 DIAGNOSIS — Z8249 Family history of ischemic heart disease and other diseases of the circulatory system: Secondary | ICD-10-CM

## 2012-09-15 DIAGNOSIS — E1159 Type 2 diabetes mellitus with other circulatory complications: Secondary | ICD-10-CM | POA: Diagnosis present

## 2012-09-15 DIAGNOSIS — K2970 Gastritis, unspecified, without bleeding: Secondary | ICD-10-CM | POA: Diagnosis not present

## 2012-09-15 DIAGNOSIS — K5732 Diverticulitis of large intestine without perforation or abscess without bleeding: Principal | ICD-10-CM | POA: Diagnosis present

## 2012-09-15 DIAGNOSIS — E119 Type 2 diabetes mellitus without complications: Secondary | ICD-10-CM | POA: Diagnosis not present

## 2012-09-15 DIAGNOSIS — N289 Disorder of kidney and ureter, unspecified: Secondary | ICD-10-CM | POA: Diagnosis not present

## 2012-09-15 DIAGNOSIS — I129 Hypertensive chronic kidney disease with stage 1 through stage 4 chronic kidney disease, or unspecified chronic kidney disease: Secondary | ICD-10-CM | POA: Diagnosis present

## 2012-09-15 DIAGNOSIS — Z882 Allergy status to sulfonamides status: Secondary | ICD-10-CM

## 2012-09-15 DIAGNOSIS — N189 Chronic kidney disease, unspecified: Secondary | ICD-10-CM

## 2012-09-15 DIAGNOSIS — J302 Other seasonal allergic rhinitis: Secondary | ICD-10-CM

## 2012-09-15 DIAGNOSIS — F3289 Other specified depressive episodes: Secondary | ICD-10-CM | POA: Diagnosis present

## 2012-09-15 DIAGNOSIS — K639 Disease of intestine, unspecified: Secondary | ICD-10-CM | POA: Diagnosis present

## 2012-09-15 DIAGNOSIS — R197 Diarrhea, unspecified: Secondary | ICD-10-CM

## 2012-09-15 DIAGNOSIS — K6389 Other specified diseases of intestine: Secondary | ICD-10-CM

## 2012-09-15 DIAGNOSIS — K2990 Gastroduodenitis, unspecified, without bleeding: Secondary | ICD-10-CM | POA: Diagnosis not present

## 2012-09-15 DIAGNOSIS — R110 Nausea: Secondary | ICD-10-CM | POA: Diagnosis not present

## 2012-09-15 DIAGNOSIS — G629 Polyneuropathy, unspecified: Secondary | ICD-10-CM

## 2012-09-15 DIAGNOSIS — M109 Gout, unspecified: Secondary | ICD-10-CM

## 2012-09-15 DIAGNOSIS — J4489 Other specified chronic obstructive pulmonary disease: Secondary | ICD-10-CM | POA: Diagnosis present

## 2012-09-15 LAB — GLUCOSE, CAPILLARY
Glucose-Capillary: 174 mg/dL — ABNORMAL HIGH (ref 70–99)
Glucose-Capillary: 197 mg/dL — ABNORMAL HIGH (ref 70–99)
Glucose-Capillary: 175 mg/dL — ABNORMAL HIGH (ref 70–99)
Glucose-Capillary: 117 mg/dL — ABNORMAL HIGH (ref 70–99)
Glucose-Capillary: 147 mg/dL — ABNORMAL HIGH (ref 70–99)

## 2012-09-15 LAB — COMPREHENSIVE METABOLIC PANEL
ALT: 23 U/L (ref 0–35)
AST: 17 U/L (ref 0–37)
Calcium: 8.4 mg/dL (ref 8.4–10.5)
Creatinine, Ser: 0.93 mg/dL (ref 0.50–1.10)
Sodium: 138 mEq/L (ref 135–145)
Total Protein: 6.6 g/dL (ref 6.0–8.3)

## 2012-09-15 LAB — URINALYSIS, ROUTINE W REFLEX MICROSCOPIC
Bilirubin Urine: NEGATIVE
Hgb urine dipstick: NEGATIVE
Nitrite: NEGATIVE
Specific Gravity, Urine: 1.027 (ref 1.005–1.030)
Urobilinogen, UA: 0.2 mg/dL (ref 0.0–1.0)
pH: 5.5 (ref 5.0–8.0)

## 2012-09-15 LAB — POCT I-STAT, CHEM 8
Calcium, Ion: 1.08 mmol/L — ABNORMAL LOW (ref 1.12–1.23)
Glucose, Bld: 203 mg/dL — ABNORMAL HIGH (ref 70–99)
HCT: 40 % (ref 36.0–46.0)
Hemoglobin: 13.6 g/dL (ref 12.0–15.0)

## 2012-09-15 LAB — SODIUM, URINE, RANDOM: Sodium, Ur: 164 mEq/L

## 2012-09-15 LAB — CBC WITH DIFFERENTIAL/PLATELET
Basophils Relative: 0 % (ref 0–1)
Eosinophils Absolute: 0.1 10*3/uL (ref 0.0–0.7)
Eosinophils Relative: 1 % (ref 0–5)
HCT: 39.3 % (ref 36.0–46.0)
Hemoglobin: 12.1 g/dL (ref 12.0–15.0)
MCH: 24.8 pg — ABNORMAL LOW (ref 26.0–34.0)
MCHC: 30.8 g/dL (ref 30.0–36.0)
Monocytes Absolute: 0.5 10*3/uL (ref 0.1–1.0)
Monocytes Relative: 5 % (ref 3–12)

## 2012-09-15 MED ORDER — NYSTATIN 100000 UNIT/GM EX POWD
Freq: Three times a day (TID) | CUTANEOUS | Status: DC
  Administered 2012-09-15 – 2012-09-19 (×10): via TOPICAL
  Filled 2012-09-15 (×2): qty 15

## 2012-09-15 MED ORDER — SODIUM CHLORIDE 0.9 % IV SOLN
1000.0000 mL | Freq: Once | INTRAVENOUS | Status: AC
  Administered 2012-09-15: 1000 mL via INTRAVENOUS

## 2012-09-15 MED ORDER — SODIUM CHLORIDE 0.9 % IV SOLN: 1000.0000 mL | INTRAVENOUS | Status: DC

## 2012-09-15 MED ORDER — ASPIRIN EC 81 MG PO TBEC
81.0000 mg | DELAYED_RELEASE_TABLET | Freq: Every day | ORAL | Status: DC
  Administered 2012-09-15 – 2012-09-20 (×6): 81 mg via ORAL
  Filled 2012-09-15 (×6): qty 1

## 2012-09-15 MED ORDER — METRONIDAZOLE IN NACL 5-0.79 MG/ML-% IV SOLN
500.0000 mg | Freq: Three times a day (TID) | INTRAVENOUS | Status: DC
  Administered 2012-09-15 – 2012-09-18 (×10): 500 mg via INTRAVENOUS
  Filled 2012-09-15 (×12): qty 100

## 2012-09-15 MED ORDER — PANTOPRAZOLE SODIUM 40 MG PO TBEC
40.0000 mg | DELAYED_RELEASE_TABLET | Freq: Every day | ORAL | Status: DC
  Administered 2012-09-15 – 2012-09-20 (×6): 40 mg via ORAL
  Filled 2012-09-15 (×5): qty 1

## 2012-09-15 MED ORDER — SIMVASTATIN 20 MG PO TABS
20.0000 mg | ORAL_TABLET | Freq: Every day | ORAL | Status: DC
  Administered 2012-09-15 – 2012-09-19 (×4): 20 mg via ORAL
  Filled 2012-09-15 (×6): qty 1

## 2012-09-15 MED ORDER — CIPROFLOXACIN IN D5W 400 MG/200ML IV SOLN
400.0000 mg | Freq: Two times a day (BID) | INTRAVENOUS | Status: DC
  Administered 2012-09-15 – 2012-09-17 (×6): 400 mg via INTRAVENOUS
  Filled 2012-09-15 (×8): qty 200

## 2012-09-15 MED ORDER — MORPHINE SULFATE 4 MG/ML IJ SOLN
4.0000 mg | Freq: Once | INTRAMUSCULAR | Status: AC
  Administered 2012-09-15: 4 mg via INTRAVENOUS
  Filled 2012-09-15: qty 1

## 2012-09-15 MED ORDER — ZOLPIDEM TARTRATE 5 MG PO TABS: 5.0000 mg | ORAL_TABLET | Freq: Every evening | ORAL | Status: DC | PRN

## 2012-09-15 MED ORDER — GABAPENTIN 300 MG PO CAPS
300.0000 mg | ORAL_CAPSULE | Freq: Three times a day (TID) | ORAL | Status: DC
  Administered 2012-09-15 – 2012-09-20 (×17): 300 mg via ORAL
  Filled 2012-09-15 (×18): qty 1

## 2012-09-15 MED ORDER — SODIUM CHLORIDE 0.9 % IV BOLUS (SEPSIS)
1000.0000 mL | Freq: Once | INTRAVENOUS | Status: AC
  Administered 2012-09-15: 1000 mL via INTRAVENOUS

## 2012-09-15 MED ORDER — DULOXETINE HCL 60 MG PO CPEP
80.0000 mg | ORAL_CAPSULE | Freq: Every day | ORAL | Status: DC
  Administered 2012-09-15 – 2012-09-20 (×6): 80 mg via ORAL
  Filled 2012-09-15 (×6): qty 1

## 2012-09-15 MED ORDER — FERROUS SULFATE 325 (65 FE) MG PO TABS
325.0000 mg | ORAL_TABLET | Freq: Every day | ORAL | Status: DC
  Administered 2012-09-15 – 2012-09-20 (×6): 325 mg via ORAL
  Filled 2012-09-15 (×8): qty 1

## 2012-09-15 MED ORDER — SODIUM CHLORIDE 0.9 % IV SOLN
INTRAVENOUS | Status: DC
  Administered 2012-09-15 – 2012-09-20 (×8): via INTRAVENOUS

## 2012-09-15 MED ORDER — HYDROMORPHONE HCL PF 1 MG/ML IJ SOLN
1.5000 mg | INTRAMUSCULAR | Status: DC | PRN
  Administered 2012-09-15: 1.5 mg via INTRAVENOUS
  Filled 2012-09-15 (×2): qty 2

## 2012-09-15 MED ORDER — MOMETASONE FURO-FORMOTEROL FUM 100-5 MCG/ACT IN AERO
2.0000 | INHALATION_SPRAY | Freq: Two times a day (BID) | RESPIRATORY_TRACT | Status: DC
  Administered 2012-09-15 – 2012-09-20 (×11): 2 via RESPIRATORY_TRACT
  Filled 2012-09-15: qty 8.8

## 2012-09-15 MED ORDER — IPRATROPIUM BROMIDE 0.02 % IN SOLN
0.5000 mg | RESPIRATORY_TRACT | Status: DC | PRN
  Administered 2012-09-17: 0.5 mg via RESPIRATORY_TRACT
  Filled 2012-09-15: qty 2.5

## 2012-09-15 MED ORDER — INSULIN ASPART 100 UNIT/ML ~~LOC~~ SOLN
6.0000 [IU] | Freq: Three times a day (TID) | SUBCUTANEOUS | Status: DC
  Administered 2012-09-15 (×2): 6 [IU] via SUBCUTANEOUS

## 2012-09-15 MED ORDER — INSULIN GLARGINE 100 UNIT/ML ~~LOC~~ SOLN
70.0000 [IU] | Freq: Two times a day (BID) | SUBCUTANEOUS | Status: DC
  Administered 2012-09-15 – 2012-09-20 (×10): 70 [IU] via SUBCUTANEOUS

## 2012-09-15 MED ORDER — ONDANSETRON HCL 4 MG/2ML IJ SOLN: 4.0000 mg | Freq: Three times a day (TID) | INTRAMUSCULAR | Status: DC | PRN

## 2012-09-15 MED ORDER — PROPRANOLOL HCL 20 MG PO TABS
20.0000 mg | ORAL_TABLET | Freq: Two times a day (BID) | ORAL | Status: DC
  Administered 2012-09-15 – 2012-09-20 (×11): 20 mg via ORAL
  Filled 2012-09-15 (×12): qty 1

## 2012-09-15 MED ORDER — ONDANSETRON HCL 4 MG PO TABS: 4.0000 mg | ORAL_TABLET | Freq: Four times a day (QID) | ORAL | Status: DC | PRN

## 2012-09-15 MED ORDER — ALLOPURINOL 300 MG PO TABS
300.0000 mg | ORAL_TABLET | Freq: Every day | ORAL | Status: DC
  Administered 2012-09-15 – 2012-09-20 (×6): 300 mg via ORAL
  Filled 2012-09-15 (×6): qty 1

## 2012-09-15 MED ORDER — ACETAMINOPHEN 325 MG PO TABS
650.0000 mg | ORAL_TABLET | Freq: Four times a day (QID) | ORAL | Status: DC | PRN
  Administered 2012-09-15: 650 mg via ORAL
  Filled 2012-09-15: qty 2

## 2012-09-15 MED ORDER — MONTELUKAST SODIUM 10 MG PO TABS
10.0000 mg | ORAL_TABLET | Freq: Every day | ORAL | Status: DC
  Administered 2012-09-15 – 2012-09-19 (×5): 10 mg via ORAL
  Filled 2012-09-15 (×6): qty 1

## 2012-09-15 MED ORDER — ACETAMINOPHEN 650 MG RE SUPP: 650.0000 mg | Freq: Four times a day (QID) | RECTAL | Status: DC | PRN

## 2012-09-15 MED ORDER — HYDROMORPHONE HCL PF 1 MG/ML IJ SOLN
1.0000 mg | INTRAMUSCULAR | Status: DC | PRN
  Administered 2012-09-15: 1 mg via INTRAVENOUS
  Filled 2012-09-15: qty 1

## 2012-09-15 MED ORDER — INSULIN ASPART 100 UNIT/ML ~~LOC~~ SOLN
0.0000 [IU] | Freq: Three times a day (TID) | SUBCUTANEOUS | Status: DC
  Administered 2012-09-15 (×3): 4 [IU] via SUBCUTANEOUS
  Administered 2012-09-18: 3 [IU] via SUBCUTANEOUS
  Administered 2012-09-18: 4 [IU] via SUBCUTANEOUS
  Administered 2012-09-19: 3 [IU] via SUBCUTANEOUS
  Administered 2012-09-19: 4 [IU] via SUBCUTANEOUS

## 2012-09-15 MED ORDER — ONDANSETRON HCL 4 MG/2ML IJ SOLN
4.0000 mg | Freq: Once | INTRAMUSCULAR | Status: AC
  Administered 2012-09-15: 4 mg via INTRAVENOUS
  Filled 2012-09-15: qty 2

## 2012-09-15 MED ORDER — ENOXAPARIN SODIUM 40 MG/0.4ML ~~LOC~~ SOLN
40.0000 mg | Freq: Every day | SUBCUTANEOUS | Status: DC
  Administered 2012-09-15 – 2012-09-20 (×6): 40 mg via SUBCUTANEOUS
  Filled 2012-09-15 (×6): qty 0.4

## 2012-09-15 MED ORDER — ALBUTEROL SULFATE (5 MG/ML) 0.5% IN NEBU
2.5000 mg | INHALATION_SOLUTION | RESPIRATORY_TRACT | Status: DC | PRN
  Administered 2012-09-17: 2.5 mg via RESPIRATORY_TRACT
  Filled 2012-09-15: qty 0.5

## 2012-09-15 MED ORDER — HYDROMORPHONE HCL PF 1 MG/ML IJ SOLN: 1.0000 mg | INTRAMUSCULAR | Status: DC | PRN

## 2012-09-15 MED ORDER — HYDROMORPHONE HCL PF 1 MG/ML IJ SOLN
1.5000 mg | INTRAMUSCULAR | Status: DC | PRN
  Administered 2012-09-15 – 2012-09-17 (×9): 1.5 mg via INTRAVENOUS
  Filled 2012-09-15 (×3): qty 2
  Filled 2012-09-15: qty 1
  Filled 2012-09-15: qty 2
  Filled 2012-09-15: qty 1
  Filled 2012-09-15: qty 2
  Filled 2012-09-15: qty 1
  Filled 2012-09-15: qty 2

## 2012-09-15 MED ORDER — OLOPATADINE HCL 0.1 % OP SOLN
1.0000 [drp] | Freq: Two times a day (BID) | OPHTHALMIC | Status: DC
  Administered 2012-09-15 – 2012-09-20 (×9): 1 [drp] via OPHTHALMIC
  Filled 2012-09-15: qty 5

## 2012-09-15 MED ORDER — ONDANSETRON HCL 4 MG/2ML IJ SOLN
4.0000 mg | Freq: Four times a day (QID) | INTRAMUSCULAR | Status: DC | PRN
  Administered 2012-09-15: 4 mg via INTRAVENOUS
  Filled 2012-09-15: qty 2

## 2012-09-15 MED ORDER — SODIUM CHLORIDE 0.9 % IV SOLN: INTRAVENOUS | Status: DC

## 2012-09-15 NOTE — ED Notes (Signed)
Pt reports since she was dc from ED yesterday she went home and ate roasted chicken and since then her sx have become worse. She took all 4 oxycodone pills prior to trying to go to sleep (midnight) with no relief. Pt grimacing and moaning in pain.

## 2012-09-15 NOTE — H&P (Signed)
Internal Medicine teaching Service Attending Dr.Josephanthony Tindel. I have personally examined the patient and reviewed the h and P documented by the Resident. In brief  Chief complaint: Abdominal pain  HOPI: Morbidly obese pleasant lady in lot of pain as detailed by the resident note and also has not passed urine since yesterday morning. Pain is a little less compared to yesterday but still significant she has been passing gas is a lil nauseous today but has not thrown up 9 point review of system as documented in the Resident note. Social history admitting medication family history past surgical history allergies reviewed. Physical examination Notable for: obese abdomen with pain in LLQ. No rigidity but cannot appreciate bowel sounds due to significant pannus. Labs are significant for : decreasing white count and improving renal function  Imaging is significant for: sigmoid diverticulitis  EKG: A and P:  As per resident documentation. In addition Straight cath as needed after bladder scan. IF persistent TEMP and pain reassess with CT scan abdomen and call surgery consult to rule out perforation and abscess.

## 2012-09-15 NOTE — ED Provider Notes (Signed)
History     CSN: [id number]  Arrival date & time 09/15/12  0149   First MD Initiated Contact with Patient 09/15/12 0155      Chief complaint abdominal pain and nausea  (Consider location/radiation/quality/duration/timing/severity/associated sxs/prior treatment) HPI  Patient presents via EMS  Patient reports that midnight 24 hours ago she started getting some pain in her lower abdomen with nausea but no vomiting. She was seen in the emergency department today around noontime and diagnosed with diverticulitis and was discharged home with pain medicine, nausea medicine and antibiotics. She states she stopped on the way home and got a roast chicken which she ate when she got home then a 4 PM she laid down and went to sleep. She reports she woke up at midnight and had been incontinent of diarrhea stool and her pain was worse. She states earlier in the day her pain was more of a cramping pain that would come and go about every 10 minutes but now her pain has been constant. She states any type of movement or walking makes the pain worse, she states nothing makes it feel better. Patient reports she's had temperature today up to 99.7. She states she's had "many" episodes of diarrhea today. She also reports she's had chronic diarrhea for the past few months and has been having rectal incontinence.  PCP Dr. Harlene Ramus of [facility]  Past Medical History  Diagnosis Date  . Anxiety   . Arthritis   . Asthma   . COPD (chronic obstructive pulmonary disease)   . Depression   . Diabetes mellitus   . GERD (gastroesophageal reflux disease)   . Hypertension   . HLD (hyperlipidemia)   . Dizziness   . History of colonic polyps   . Lumbar pain 2011    per x-ray - Multilevel spondylosis  . Cervical dystonia   . Tremor CCENTRAL NERVOUS SYSTEM  . Torn rotator cuff RT SHOULDER  . Dermatitis   . Diarrhea   . History of kidney stones   . Recurrent boils     "SLUSTER BOILS" ON ABD AND LABIA  . History of  seborrheic dermatitis     face,ears  . OSA on CPAP 2008  . Sleep apnea     USES C-PAP    Past Surgical History  Procedure Date  . Pilonidal cyst excision 4098,1191  . Tubal ligation   . Lumbar laminectomy   . Carpal tunnel release 2006    left  . Elbow surgery LEFT  . Knee arthroscopy LEFT  . Nasal sinus surgery   . Hand surgery RIGHT  . Joint replacement     rt shoulder  . Mass excision 06/28/2012    Procedure: EXCISION MASS;  Surgeon: Shelly Rubenstein, MD;  Location: [facility] OR;  Service: General;  Laterality: Left;  excision left upper quadrant abdominal wall mass    Family History  Problem Relation Age of Onset  . Heart disease Brother   . Stroke Neg Hx   . Cancer Neg Hx     History  Substance Use Topics  . Smoking status: Former Games developer  . Smokeless tobacco: Former Neurosurgeon    Quit date: 07/08/1997  . Alcohol Use: No  Lives at home Lives alone On disability   OB History    Grav Para Term Preterm Abortions TAB SAB Ect Mult Living                  Review of Systems  All other systems reviewed and are negative.  Allergies  Ace inhibitors; Cefaclor; Shrimp; and Sulfonamide derivatives  Home Medications   Current Outpatient Rx  Name  Route  Sig  Dispense  Refill  . ADVAIR DISKUS 250-50 MCG/DOSE IN AEPB   Inhalation   Inhale 1 puff into the lungs 2 (two) times daily.         . ALBUTEROL SULFATE HFA 108 (90 BASE) MCG/ACT IN AERS   Inhalation   Inhale 2 puffs into the lungs every 6 (six) hours as needed. Wheezing          . ALLOPURINOL 300 MG PO TABS      TAKE 1 TABLET BY MOUTH DAILY   30 tablet   3   . BD INSULIN SYRINGE ULTRAFINE 30G X 1/2" 1 ML MISC      USE AS DIRECTED WITH EVERY MEAL AND AT BEDTIME   100 each   2   . CIPROFLOXACIN HCL 500 MG PO TABS   Oral   Take 1 tablet (500 mg total) by mouth every 12 (twelve) hours.   10 tablet   0   . DULOXETINE HCL 20 MG PO CPEP   Oral   Take 20 mg by mouth daily. With 60mg  capsule = 80mg           . DULOXETINE HCL 60 MG PO CPEP   Oral   Take 60 mg by mouth daily. With 20mg  capsule = 80mg          . FERROUS SULFATE 325 (65 FE) MG PO TABS   Oral   Take 1 tablet (325 mg total) by mouth daily.   30 tablet   3   . FLUCONAZOLE 150 MG PO TABS               . FUROSEMIDE 40 MG PO TABS      TAKE 1 TABLET BY MOUTH DAILY   90 tablet   0   . GABAPENTIN 300 MG PO CAPS   Oral   Take 1 capsule (300 mg total) by mouth 3 (three) times daily.   90 capsule   3   . GLUCOSE BLOOD VI STRP   Other   1 each by Other route 3 (three) times daily before meals. Use as instructed   100 each   12   . HYDROCODONE-ACETAMINOPHEN 5-325 MG PO TABS   Oral   Take 1 tablet by mouth daily.         . IBUPROFEN 200 MG PO TABS   Oral   Take 1 tablet (200 mg total) by mouth every 8 (eight) hours as needed for pain.   30 tablet   2   . INSULIN ASPART 100 UNIT/ML [place] SOLN   Subcutaneous   Inject 60 Units into the skin 3 (three) times daily before meals.          . INSULIN GLARGINE 100 UNIT/ML [place] SOLN   Subcutaneous   Inject 70 Units into the skin 2 (two) times daily.          Marland Kitchen METFORMIN HCL 1000 MG PO TABS   Oral   Take 1 tablet (1,000 mg total) by mouth 2 (two) times daily with a meal.   180 tablet   3   . METHOCARBAMOL 500 MG PO TABS   Oral   Take 500 mg by mouth 2 (two) times daily.          Marland Kitchen METRONIDAZOLE 500 MG PO TABS   Oral   Take 1 tablet (500 mg  total) by mouth 2 (two) times daily.   14 tablet   0   . MONTELUKAST SODIUM 10 MG PO TABS   Oral   Take 1 tablet (10 mg total) by mouth at bedtime.   96 tablet   2   . NASONEX 50 MCG/ACT NA SUSP      USE 2 SPRAYS IN EACH NOSTRIL THE DAILY   1 Inhaler   3   . OLOPATADINE HCL 0.1 % OP SOLN   Both Eyes   Place 1 drop into both eyes 2 (two) times daily as needed. Itching eyes   5 mL   2   . ONDANSETRON HCL 4 MG PO TABS   Oral   Take 1 tablet (4 mg total) by mouth every 6 (six) hours.   12 tablet   0    . ONETOUCH DELICA LANCETS FINE MISC   Does not apply   1 each by Does not apply route 4 (four) times daily.   200 each   1   . OXYCODONE-ACETAMINOPHEN 5-325 MG PO TABS   Oral   Take 1 tablet by mouth every 4 (four) hours as needed for pain.   15 tablet   0   . PANTOPRAZOLE SODIUM 40 MG PO TBEC   Oral   Take 1 tablet (40 mg total) by mouth daily.   90 tablet   3   . PRAVASTATIN SODIUM 40 MG PO TABS   Oral   Take 1 tablet (40 mg total) by mouth daily.   90 tablet   3   . PROPRANOLOL HCL 20 MG PO TABS   Oral   Take 1 tablet (20 mg total) by mouth 2 (two) times daily.   60 tablet   11   . SELENIUM SULFIDE 1 % EX LOTN   Topical   Apply 1 application topically once a week. For face         . SYRINGE/NEEDLE (DISP) 30G X 1/2" 1 ML MISC   Does not apply   1 Syringe by Does not apply route 3 (three) times daily.           Marland Kitchen ZOLPIDEM TARTRATE 5 MG PO TABS      TAKE ONE TABLET BY MOUTH AT BEDTIME AS NEEDED FOR SLEEP   30 tablet   0     BP 139/83  Pulse 84  Temp 97.9 F (36.6 C) (Oral)  Resp 18  SpO2 95%  LMP 10/18/1995  Vital signs normal    Physical Exam  Nursing note and vitals reviewed. Constitutional: She is oriented to person, place, and time. She appears well-developed and well-nourished.  Non-toxic appearance. She does not appear ill. She appears distressed.       Patient gagging as if she is going to throw up  HENT:  Head: Normocephalic and atraumatic.  Right Ear: External ear normal.  Left Ear: External ear normal.  Nose: Nose normal. No mucosal edema or rhinorrhea.  Mouth/Throat: Mucous membranes are normal. No dental abscesses or uvula swelling.       Tongue dry  Eyes: Conjunctivae normal and EOM are normal. Pupils are equal, round, and reactive to light.  Neck: Normal range of motion and full passive range of motion without pain. Neck supple.  Cardiovascular: Normal rate, regular rhythm and normal heart sounds.  Exam reveals no gallop and no  friction rub.   No murmur heard. Pulmonary/Chest: Effort normal and breath sounds normal. No respiratory distress. She has no wheezes.  She has no rhonchi. She has no rales. She exhibits no tenderness and no crepitus.  Abdominal: Soft. Normal appearance and bowel sounds are normal. She exhibits no distension. There is no tenderness. There is no rebound and no guarding.       Patient indicates her lower abdomen is where her pain is located however she has no pain when I palpate her abdomen.  Musculoskeletal: Normal range of motion. She exhibits no edema and no tenderness.       Moves all extremities well.   Neurological: She is alert and oriented to person, place, and time. She has normal strength. No cranial nerve deficit.  Skin: Skin is warm, dry and intact. No rash noted. No erythema. No pallor.  Psychiatric: She has a normal mood and affect. Her speech is normal and behavior is normal. Her mood appears not anxious.    ED Course  Procedures (including critical care time)   Medications  0.9 %  sodium chloride infusion (0 mL Intravenous Stopped 09/15/12 0425)    Followed by  0.9 %  sodium chloride infusion (not administered)  morphine 4 MG/ML injection 4 mg (4 mg Intravenous Given 09/15/12 0249)  ondansetron (ZOFRAN) injection 4 mg (4 mg Intravenous Given 09/15/12 0247)  sodium chloride 0.9 % bolus 1,000 mL (1000 mL Intravenous New Bag/Given 09/15/12 0418)  morphine 4 MG/ML injection 4 mg (4 mg Intravenous Given 09/15/12 0414)   Recheck at oh 3:50 AM patient states she still has pain, her abdomen is palpated it soft and diffusely tender to lower abdomen there is no guarding or rebound, there is no rigidity of the abdomen. I will talk to her doctors about admission.  04:12 Dr Mardene Speak, admit to med-surg Dr Lonzo Cloud  Results for orders placed during the hospital encounter of 09/15/12  CBC WITH DIFFERENTIAL      Component Value Range   WBC 9.4  4.0 - 10.5 K/uL   RBC 4.87  3.87 - 5.11 MIL/uL    Hemoglobin 12.1  12.0 - 15.0 g/dL   HCT 29.5  62.1 - 30.8 %   MCV 80.7  78.0 - 100.0 fL   MCH 24.8 (*) 26.0 - 34.0 pg   MCHC 30.8  30.0 - 36.0 g/dL   RDW 65.7 (*) 84.6 - 96.2 %   Platelets 197  150 - 400 K/uL   Neutrophils Relative 81 (*) 43 - 77 %   Neutro Abs 7.6  1.7 - 7.7 K/uL   Lymphocytes Relative 13  12 - 46 %   Lymphs Abs 1.2  0.7 - 4.0 K/uL   Monocytes Relative 5  3 - 12 %   Monocytes Absolute 0.5  0.1 - 1.0 K/uL   Eosinophils Relative 1  0 - 5 %   Eosinophils Absolute 0.1  0.0 - 0.7 K/uL   Basophils Relative 0  0 - 1 %   Basophils Absolute 0.0  0.0 - 0.1 K/uL  POCT I-STAT, CHEM 8      Component Value Range   Sodium 139  135 - 145 mEq/L   Potassium 3.9  3.5 - 5.1 mEq/L   Chloride 101  96 - 112 mEq/L   BUN 14  6 - 23 mg/dL   Creatinine, Ser 9.52 (*) 0.50 - 1.10 mg/dL   Glucose, Bld 841 (*) 70 - 99 mg/dL   Calcium, Ion 3.24 (*) 1.12 - 1.23 mmol/L   TCO2 26  0 - 100 mmol/L   Hemoglobin 13.6  12.0 - 15.0 g/dL  HCT 40.0  36.0 - 46.0 %   Laboratory interpretation all normal except Renal insufficiency since this morning, hyperglycemia, normal WBC count   Results for orders placed during the hospital encounter of 09/14/12  CBC WITH DIFFERENTIAL      Component Value Range   WBC 11.4 (*) 4.0 - 10.5 K/uL   RBC 4.59  3.87 - 5.11 MIL/uL   Hemoglobin 11.1 (*) 12.0 - 15.0 g/dL   HCT 16.1  09.6 - 04.5 %   MCV 80.0  78.0 - 100.0 fL   MCH 24.2 (*) 26.0 - 34.0 pg   MCHC 30.2  30.0 - 36.0 g/dL   RDW 40.9 (*) 81.1 - 91.4 %   Platelets 196  150 - 400 K/uL   Neutrophils Relative 81 (*) 43 - 77 %   Neutro Abs 9.2 (*) 1.7 - 7.7 K/uL   Lymphocytes Relative 12  12 - 46 %   Lymphs Abs 1.3  0.7 - 4.0 K/uL   Monocytes Relative 6  3 - 12 %   Monocytes Absolute 0.7  0.1 - 1.0 K/uL   Eosinophils Relative 1  0 - 5 %   Eosinophils Absolute 0.1  0.0 - 0.7 K/uL   Basophils Relative 0  0 - 1 %   Basophils Absolute 0.0  0.0 - 0.1 K/uL  COMPREHENSIVE METABOLIC PANEL      Component Value  Range   Sodium 136  135 - 145 mEq/L   Potassium 3.3 (*) 3.5 - 5.1 mEq/L   Chloride 101  96 - 112 mEq/L   CO2 25  19 - 32 mEq/L   Glucose, Bld 129 (*) 70 - 99 mg/dL   BUN 11  6 - 23 mg/dL   Creatinine, Ser 7.82  0.50 - 1.10 mg/dL   Calcium 8.7  8.4 - 95.6 mg/dL   Total Protein 7.0  6.0 - 8.3 g/dL   Albumin 3.2 (*) 3.5 - 5.2 g/dL   AST 21  0 - 37 U/L   ALT 31  0 - 35 U/L   Alkaline Phosphatase 87  39 - 117 U/L   Total Bilirubin 0.5  0.3 - 1.2 mg/dL   GFR calc non Af Amer 76 (*) >90 mL/min   GFR calc Af Amer 88 (*) >90 mL/min  URINALYSIS, ROUTINE W REFLEX MICROSCOPIC      Component Value Range   Color, Urine AMBER (*) YELLOW   APPearance CLEAR  CLEAR   Specific Gravity, Urine 1.033 (*) 1.005 - 1.030   pH 5.5  5.0 - 8.0   Glucose, UA NEGATIVE  NEGATIVE mg/dL   Hgb urine dipstick NEGATIVE  NEGATIVE   Bilirubin Urine SMALL (*) NEGATIVE   Ketones, ur 15 (*) NEGATIVE mg/dL   Protein, ur 30 (*) NEGATIVE mg/dL   Urobilinogen, UA 1.0  0.0 - 1.0 mg/dL   Nitrite NEGATIVE  NEGATIVE   Leukocytes, UA NEGATIVE  NEGATIVE  URINE MICROSCOPIC-ADD ON      Component Value Range   Squamous Epithelial / LPF RARE  RARE   WBC, UA 0-2  <3 WBC/hpf   RBC / HPF 0-2  <3 RBC/hpf   Bacteria, UA RARE  RARE   Ct Abdomen Pelvis W Contrast  09/14/2012  *RADIOLOGY REPORT*  Clinical Data: Left lower quadrant pain.  CT ABDOMEN AND PELVIS WITH CONTRAST  Technique:  Multidetector CT imaging of the abdomen and pelvis was performed following the standard protocol during bolus administration of intravenous contrast.  Contrast: OMNIPAQUE IOHEXOL 300  MG/ML  SOLN  Comparison: 11/27/2007  Findings: Lung bases are clear.  No effusions.  Heart is normal size.  Suspect slight fatty infiltration of the liver.  No focal abnormality.  Stomach, gallbladder, spleen, pancreas, adrenals and right kidney are unremarkable.  Benign-appearing cyst in the lower pole of the left kidney measuring 2.7 cm, slightly larger than 2009 when  this measured 1.6 cm.  No hydronephrosis.  Appendix is visualized and is normal.  There is wall thickening and inflammatory change within the mid sigmoid colon.  Diverticula were noted in this area on prior study.  Findings most compatible with diverticulitis.  Small bowel is decompressed.  No free fluid, free air or adenopathy.  Uterus, adnexa urinary bladder grossly unremarkable.  IMPRESSION: Findings compatible with acute sigmoid diverticulitis.   Original Report Authenticated By: Charlett Nose, M.D.      1. Diverticulitis   2. Renal insufficiency   3. Dehydration   4. Diarrhea    Plan admission  Devoria Albe, MD, FACEP    MDM          Ward Givens, MD 09/15/12 [id number]

## 2012-09-15 NOTE — ED Notes (Signed)
Daughter [name] Rice telephone number [phone] if needed to get in touch with. Will be back later today.

## 2012-09-15 NOTE — ED Notes (Addendum)
Per EMS:  Pt complaining of generalized abd pain, pt was seen here yesterday was diagnosed with diverticulitis and released, pt ate fried chicken today prior to pain.  Pt was sent home with an rx for oxycodone, pt took one of those 1 hour ago with no relief.  Pt has no vomited but is nauseous, st's she has had some diarrhea and st's there was some blood in her stool.

## 2012-09-15 NOTE — Progress Notes (Signed)
Ice chips 

## 2012-09-15 NOTE — Progress Notes (Signed)
PT Cancellation Note  Patient Details Name: [name] Rice MRN: [id number] DOB: Apr 28, 1956   Cancelled Treatment:    Reason Eval/Treat Not Completed: Pain limiting ability to participate   [facility] 09/15/2012, 10:48 AM

## 2012-09-15 NOTE — ED Notes (Signed)
Pt placed on 4L [place]. Stats 97%.

## 2012-09-15 NOTE — H&P (Signed)
Hospital Admission Note Date: 09/15/2012  Patient name: [name] Rice Medical record number: [id number] Date of birth: April 08, 1956 Age: 56 y.o. Gender: female PCP: Elyse Jarvis, MD  Medical Service: Internal Medicine Teaching Service--[place]  Attending physician: Dr. Lonzo Cloud    1st Contact: Dr. Darnelle Bos 2nd Contact: Dr. Manson Passey    Pager:[phone] After 5 pm or weekends: 1st Contact:      Pager: [phone] 2nd Contact:      Pager: [phone]  Chief Complaint: Abdominal Pain  History of Present Illness:[name] Rice is a morbidly obese 55 year old white female with PMH of Depression, COPD, OSA, HTN, DM II, HLD,nephrolithiasis, and chronic back pain presenting to the ED today with complaints of worsening crampy abdominal pain x2 days duration.  She claims she started to not feel well on Wednesday and did not get out of bed all day, she then woke up at 5am Thursday morning 09/13/12, had soiled herself, and had severe crampy diffuse abdominal pain.  The pain was associated with a feeling of bowel pressure and having to constantly go to the restroom every 10 minutes where she would pass small soft BMs but not much urination.  The pain is 10+, crampy, started left lower abdominal/groin region but quickly became diffuse, worse with movement, improved with eating, radiating to b/l lower back and groin, associated with nausea but no vomiting.  Tmax 99.57F at home yesterday.  Due to the pain on Thursday, [name] Rice came to the ED for evaluation and was found to have likely sigmoid diverticulitis on CT scan.  She was subsequently discharged home on a course of ciprofloxacin and metronidazole and was also given percocet and zofran.  On the way home, [name] Rice stopped to get some roast chicken, had a few bites, and again had to go to the bathroom, this time with mucousy--white/stringy BMs.  She also started noticing bright red streaks on her toilet paper when wiping and small speckles of blood in her stool which  she attributes to her history of hemorrhoids.  She also reports subjective fevers, chills, nausea, and headaches, but denies any chest pain, vomiting, shortness of breath, constipation, dysuria or any other urinary complaints at this time.    Of note, she is followed in [facility] by Dr. Coralyn Pear.  She was initially diagnoses with diverticulosis 6 months prior and her last colonoscopy was approximately 4-5 years ago where she reports some polyps were removed by Dr. Marina Goodell.    Meds: Current Outpatient Rx  Name  Route  Sig  Dispense  Refill  . ADVAIR DISKUS 250-50 MCG/DOSE IN AEPB   Inhalation   Inhale 1 puff into the lungs 2 (two) times daily.         . ALBUTEROL SULFATE HFA 108 (90 BASE) MCG/ACT IN AERS   Inhalation   Inhale 2 puffs into the lungs every 6 (six) hours as needed. Wheezing          . ALLOPURINOL 300 MG PO TABS      TAKE 1 TABLET BY MOUTH DAILY   30 tablet   3   . CIPROFLOXACIN HCL 500 MG PO TABS   Oral   Take 1 tablet (500 mg total) by mouth every 12 (twelve) hours.   10 tablet   0   . DULOXETINE HCL 20 MG PO CPEP   Oral   Take 20 mg by mouth daily. With 60mg  capsule = 80mg          . DULOXETINE HCL 60 MG  PO CPEP   Oral   Take 60 mg by mouth daily. With 20mg  capsule = 80mg          . FERROUS SULFATE 325 (65 FE) MG PO TABS   Oral   Take 1 tablet (325 mg total) by mouth daily.   30 tablet   3   . FUROSEMIDE 40 MG PO TABS      TAKE 1 TABLET BY MOUTH DAILY   90 tablet   0   . GABAPENTIN 300 MG PO CAPS   Oral   Take 1 capsule (300 mg total) by mouth 3 (three) times daily.   90 capsule   3   . HYDROCODONE-ACETAMINOPHEN 5-325 MG PO TABS   Oral   Take 1 tablet by mouth daily.         . IBUPROFEN 200 MG PO TABS   Oral   Take 1 tablet (200 mg total) by mouth every 8 (eight) hours as needed for pain.   30 tablet   2   . INSULIN ASPART 100 UNIT/ML [place] SOLN   Subcutaneous   Inject 60 Units into the skin 3 (three) times daily before meals.            . INSULIN GLARGINE 100 UNIT/ML [place] SOLN   Subcutaneous   Inject 70 Units into the skin 2 (two) times daily.          Marland Kitchen METFORMIN HCL 1000 MG PO TABS   Oral   Take 1 tablet (1,000 mg total) by mouth 2 (two) times daily with a meal.   180 tablet   3   . METHOCARBAMOL 500 MG PO TABS   Oral   Take 500 mg by mouth 2 (two) times daily.          Marland Kitchen METRONIDAZOLE 500 MG PO TABS   Oral   Take 1 tablet (500 mg total) by mouth 2 (two) times daily.   14 tablet   0   . MONTELUKAST SODIUM 10 MG PO TABS   Oral   Take 1 tablet (10 mg total) by mouth at bedtime.   96 tablet   2   . NASONEX 50 MCG/ACT NA SUSP      USE 2 SPRAYS IN EACH NOSTRIL THE DAILY   1 Inhaler   3   . OLOPATADINE HCL 0.1 % OP SOLN   Both Eyes   Place 1 drop into both eyes 2 (two) times daily as needed. Itching eyes   5 mL   2   . ONDANSETRON HCL 4 MG PO TABS   Oral   Take 1 tablet (4 mg total) by mouth every 6 (six) hours.   12 tablet   0   . OXYCODONE-ACETAMINOPHEN 5-325 MG PO TABS   Oral   Take 1 tablet by mouth every 4 (four) hours as needed for pain.   15 tablet   0   . PANTOPRAZOLE SODIUM 40 MG PO TBEC   Oral   Take 1 tablet (40 mg total) by mouth daily.   90 tablet   3   . PRAVASTATIN SODIUM 40 MG PO TABS   Oral   Take 1 tablet (40 mg total) by mouth daily.   90 tablet   3   . PROPRANOLOL HCL 20 MG PO TABS   Oral   Take 1 tablet (20 mg total) by mouth 2 (two) times daily.   60 tablet   11   . SELENIUM SULFIDE 1 % EX  LOTN   Topical   Apply 1 application topically once a week. For face         . ZOLPIDEM TARTRATE 5 MG PO TABS      TAKE ONE TABLET BY MOUTH AT BEDTIME AS NEEDED FOR SLEEP   30 tablet   0   . BD INSULIN SYRINGE ULTRAFINE 30G X 1/2" 1 ML MISC      USE AS DIRECTED WITH EVERY MEAL AND AT BEDTIME   100 each   2   . GLUCOSE BLOOD VI STRP   Other   1 each by Other route 3 (three) times daily before meals. Use as instructed   100 each   12   . ONETOUCH DELICA  LANCETS FINE MISC   Does not apply   1 each by Does not apply route 4 (four) times daily.   200 each   1   . SYRINGE/NEEDLE (DISP) 30G X 1/2" 1 ML MISC   Does not apply   1 Syringe by Does not apply route 3 (three) times daily.            Allergies: Allergies as of 09/15/2012 - Review Complete 09/15/2012  Allergen Reaction Noted  . Ace inhibitors    . Cefaclor Hives   . Shrimp (shellfish allergy) Nausea And Vomiting 10/12/2011  . Sulfonamide derivatives Rash    Past Medical History  Diagnosis Date  . Anxiety   . Arthritis   . Asthma   . COPD (chronic obstructive pulmonary disease)   . Depression   . Diabetes mellitus   . GERD (gastroesophageal reflux disease)   . Hypertension   . HLD (hyperlipidemia)   . Dizziness   . History of colonic polyps   . Lumbar pain 2011    per x-ray - Multilevel spondylosis  . Cervical dystonia   . Tremor CCENTRAL NERVOUS SYSTEM  . Torn rotator cuff RT SHOULDER  . Dermatitis   . Diarrhea   . History of kidney stones   . Recurrent boils     "SLUSTER BOILS" ON ABD AND LABIA  . History of seborrheic dermatitis     face,ears  . OSA on CPAP 2008  . Sleep apnea     USES C-PAP   Past Surgical History  Procedure Date  . Pilonidal cyst excision 1610,9604  . Tubal ligation   . Lumbar laminectomy   . Carpal tunnel release 2006    left  . Elbow surgery LEFT  . Knee arthroscopy LEFT  . Nasal sinus surgery   . Hand surgery RIGHT  . Joint replacement     rt shoulder  . Mass excision 06/28/2012    Procedure: EXCISION MASS;  Surgeon: Shelly Rubenstein, MD;  Location: [facility] OR;  Service: General;  Laterality: Left;  excision left upper quadrant abdominal wall mass   Family History  Problem Relation Age of Onset  . Heart disease Brother   . Stroke Neg Hx   . Cancer Neg Hx    History   Social History  . Marital Status: Divorced    Spouse Name: N/A    Number of Children: N/A  . Years of Education: N/A   Occupational History  . Not  on file.   Social History Main Topics  . Smoking status: Former Games developer  . Smokeless tobacco: Former Neurosurgeon    Quit date: 07/08/1997  . Alcohol Use: No  . Drug Use: No  . Sexually Active: Not Currently   Other Topics Concern  .  Not on file   Social History Narrative  . No narrative on file   Review of Systems: Pertinent items are noted in HPI.  Physical Exam: Blood pressure 186/70, pulse 107, temperature 97.9 F (36.6 C), temperature source Oral, resp. rate 23, last menstrual period 10/18/1995, SpO2 97.00%. Vitals reviewed. General: resting in bed, acute distress secondary to abdominal pain, morbidly obese, on 4L [place] O2 HEENT: PERRLA, EOMI, no scleral icterus Cardiac: Tachycardia, distant heart sounds due to body habitus Pulm: clear to auscultation bilaterally, no wheezes, rales, or rhonchi Abd: soft, obese, tenderness to light palpation diffusely, + rebound tenderness, +BS present. + pink rash/skin peeling under left abdominal fold, + b/l cva tenderness.  Ext: warm and well perfused, no pedal edema, +2dp b/l Neuro: alert and oriented X3, cranial nerves II-XII grossly intact, strength and sensation to light touch equal in bilateral upper and lower extremities  Lab results: Basic Metabolic Panel:  Basename 09/15/12 0304 09/14/12 1031  NA 139 136  K 3.9 3.3*  CL 101 101  CO2 -- 25  GLUCOSE 203* 129*  BUN 14 11  CREATININE 1.20* 0.83  CALCIUM -- 8.7  MG -- --  PHOS -- --   Liver Function Tests:  Basename 09/14/12 1031  AST 21  ALT 31  ALKPHOS 87  BILITOT 0.5  PROT 7.0  ALBUMIN 3.2*   CBC:  Basename 09/15/12 0304 09/15/12 0216 09/14/12 1031  WBC -- 9.4 11.4*  NEUTROABS -- 7.6 9.2*  HGB 13.6 12.1 --  HCT 40.0 39.3 --  MCV -- 80.7 80.0  PLT -- 197 196   Urine Drug Screen: Drugs of Abuse     Component Value Date/Time   LABOPIA NEG 08/08/2011 1600   COCAINSCRNUR NEG 08/08/2011 1600   LABBENZ NEG 08/08/2011 1600   AMPHETMU NEG 08/08/2011 1600      Urinalysis:  Basename 09/14/12 1018  COLORURINE AMBER*  LABSPEC 1.033*  PHURINE 5.5  GLUCOSEU NEGATIVE  HGBUR NEGATIVE  BILIRUBINUR SMALL*  KETONESUR 15*  PROTEINUR 30*  UROBILINOGEN 1.0  NITRITE NEGATIVE  LEUKOCYTESUR NEGATIVE   Imaging results:  Ct Abdomen Pelvis W Contrast  09/14/2012  *RADIOLOGY REPORT*  Clinical Data: Left lower quadrant pain.  CT ABDOMEN AND PELVIS WITH CONTRAST  Technique:  Multidetector CT imaging of the abdomen and pelvis was performed following the standard protocol during bolus administration of intravenous contrast.  Contrast: OMNIPAQUE IOHEXOL 300 MG/ML  SOLN  Comparison: 11/27/2007  Findings: Lung bases are clear.  No effusions.  Heart is normal size.  Suspect slight fatty infiltration of the liver.  No focal abnormality.  Stomach, gallbladder, spleen, pancreas, adrenals and right kidney are unremarkable.  Benign-appearing cyst in the lower pole of the left kidney measuring 2.7 cm, slightly larger than 2009 when this measured 1.6 cm.  No hydronephrosis.  Appendix is visualized and is normal.  There is wall thickening and inflammatory change within the mid sigmoid colon.  Diverticula were noted in this area on prior study.  Findings most compatible with diverticulitis.  Small bowel is decompressed.  No free fluid, free air or adenopathy.  Uterus, adnexa urinary bladder grossly unremarkable.  IMPRESSION: Findings compatible with acute sigmoid diverticulitis.   Original Report Authenticated By: Charlett Nose, M.D.    Assessment & Plan by Problem: [name] Rice is a morbidly obese 56 year old white female with PMH of Depression, OSA, HTN, DM II admitted for abdominal pain likely secondary to acute diverticulitis.      Abdominal pain--likely secondary to Sigmoid  diverticulitis--confirmed on CT abdomen 09/14/12: mid sigmoid colon wall thickening and inflammatory change.  Hx of diverticulosis diagnosed 6 months prior.  Benign appear cyst in lower pole of left kidney.   Diffuse severe crampy abdominal pain, WBC 09/14/12 11.4  with ANC 9.2 improved today 9.4 with ANC 7.6 respectively.  Afebrile.  Other etiologies of abdominal pain could also include pancreatitis, nephrolithiasis, cholecystitis/cholelithiasis, PUD, however not seen on CT A/P--stomach, gb, spleen, pancreas, adrenals, and right kidney unremarkable.   -admit to med surg -NPO -IVF -pain control--Dilaudid PRN -U/A, urine cx -AM labs: CBC, CMP, lipase -continue IV Ciprofloxacin and Flagyl -consider cdiff if diarrhea persists -Zofran PRN nausea -protonix   DIABETES MELLITUS, TYPE II--last HbA1C: 6.8 08/20/12.  On home regimen: Lantus 70 units BID, Novolog 60 units TID before meals, and metformin 1000mg  BID -CBG monitoring -SSI resistant -hold metformin   HYPERTENSION--BP on admission 186/70. In severe abdominal pain.  On Propranolol at home -pain control -continue propranolol -continue to monitor   OSA on CPAP--level at 77mmH2O.  Morbidly obese -continue CPAP.   Acute kidney injury--Cr on admission 1.20. BUN:14.  Likely pre-renal.  Baseline GFR 65-70--CKD II.  Reports minimal PO intake over the past few days. -IVF -FeNa: Urine Na and Cr -AM CMET -continue to monitor   Depression--on home regimen: Cymbalta 80mg  QD.  Not currently seeing a psychiatrist.   -continue Cymbalta  Diet: NPO DVT Ppx: Lovenox Dispo: Observation.  Disposition is deferred at this time, awaiting improvement of current medical problems. Anticipated discharge in approximately 1-2 day(s).   The patient does have a current PCP (SAWHNEY,MEGHA, MD), therefore will be requiring OPC follow-up after discharge.   The patient does not have transportation limitations that hinder transportation to clinic appointments.  SignedDarden Palmer 09/15/2012, 5:12 AM

## 2012-09-16 LAB — CBC WITH DIFFERENTIAL/PLATELET
Basophils Absolute: 0 10*3/uL (ref 0.0–0.1)
Basophils Relative: 0 % (ref 0–1)
MCHC: 30.5 g/dL (ref 30.0–36.0)
Neutro Abs: 12.3 10*3/uL — ABNORMAL HIGH (ref 1.7–7.7)
Neutrophils Relative %: 84 % — ABNORMAL HIGH (ref 43–77)
Platelets: 224 10*3/uL (ref 150–400)
RDW: 16.7 % — ABNORMAL HIGH (ref 11.5–15.5)

## 2012-09-16 LAB — BASIC METABOLIC PANEL
Calcium: 8.8 mg/dL (ref 8.4–10.5)
Creatinine, Ser: 0.79 mg/dL (ref 0.50–1.10)
GFR calc Af Amer: >90 mL/min (ref >90)
Sodium: 139 mEq/L (ref 135–145)

## 2012-09-16 LAB — GLUCOSE, CAPILLARY: Glucose-Capillary: 88 mg/dL (ref 70–99)

## 2012-09-16 LAB — URIC ACID: Uric Acid, Serum: 4.1 mg/dL (ref 2.4–7.0)

## 2012-09-16 NOTE — Progress Notes (Addendum)
Subjective: Pt had no acute events overnight, resolved tachycardia and HTN this AM. Pt reports feeling better this AM in regards to pain management but is still requiring q3h dilaudid. Her abdominal pain is more concentrated in her back and resolves with dilaudid. She has not had diarrhea and self reports 2 well formed BM that were dark in nature but w/o blood or mucus. She reports recently being constipated for past couple of days and has trouble getting meals 2/2 to financial situation. Her depression has also made her less motivated the last week but feels this is stablizing now as she has made new social connections and a supportive friend. She has tolerated ice chips and is interested in pursuing clears later on today. She has some nausea during Abx administration but has not had any emesis. She also complains of some SOB worse w/lying down and has not been able to use her CPAP. She feels that her depression and baseline SOB has caused some deconditioning and contributed to DOE and current SOB.    Objective: Vital signs in last 24 hours: Filed Vitals:   09/15/12 1959 09/15/12 2233 09/16/12 0544 09/16/12 0856  BP:  140/68 148/79 146/69  Pulse:  83 80 88  Temp:  97.8 F (36.6 C) 98 F (36.7 C) 98.5 F (36.9 C)  TempSrc:  Oral Oral   Resp:  20 20 19   Height:      Weight:  280 lb 9.6 oz (127.279 kg)    SpO2: 92% 97% 91% 94%   Weight change: -7.8 oz (-0.221 kg)  Intake/Output Summary (Last 24 hours) at 09/16/12 0951 Last data filed at 09/16/12 0857  Gross per 24 hour  Intake   1730 ml  Output    900 ml  Net    830 ml   General: laying flat in bed, slight discomfort but able to maintain good conversation in full sentences Cardiac: RRR, no rubs, murmurs or gallops Pulm: clear to auscultation bilaterally, moving normal volumes of air Abd: soft, obese, slight tenderness in LLQ, hyperactive BS Ext: warm and well perfused, no pedal edema Neuro: alert and oriented X3, cranial nerves II-XII  grossly intact  Lab Results: Basic Metabolic Panel:  Lab 09/16/12 1610 09/15/12 0633  NA 139 138  K 4.2 4.0  CL 104 103  CO2 25 25  GLUCOSE 122* 198*  BUN 9 12  CREATININE 0.79 0.93  CALCIUM 8.8 8.4  MG -- --  PHOS -- --   CBC:  Lab 09/16/12 0605 09/15/12 0304 09/15/12 0216  WBC 14.6* -- 9.4  NEUTROABS 12.3* -- 7.6  HGB 12.2 13.6 --  HCT 40.0 40.0 --  MCV 82.6 -- 80.7  PLT 224 -- 197   CBG:  Lab 09/16/12 0738 09/15/12 2211 09/15/12 1628 09/15/12 1141 09/15/12 0733 09/15/12 0621  GLUCAP 105* 147* 117* 175* 197* 174*   Uric acid level: 4.1  Micro Results: No results found for this or any previous visit (from the past 240 hour(s)).  Studies/Results: Ct Abdomen Pelvis W Contrast  09/14/2012  *RADIOLOGY REPORT*  Clinical Data: Left lower quadrant pain.  CT ABDOMEN AND PELVIS WITH CONTRAST  Technique:  Multidetector CT imaging of the abdomen and pelvis was performed following the standard protocol during bolus administration of intravenous contrast.  Contrast: OMNIPAQUE IOHEXOL 300 MG/ML  SOLN  Comparison: 11/27/2007  Findings: Lung bases are clear.  No effusions.  Heart is normal size.  Suspect slight fatty infiltration of the liver.  No focal abnormality.  Stomach, gallbladder, spleen, pancreas, adrenals and right kidney are unremarkable.  Benign-appearing cyst in the lower pole of the left kidney measuring 2.7 cm, slightly larger than 2009 when this measured 1.6 cm.  No hydronephrosis.  Appendix is visualized and is normal.  There is wall thickening and inflammatory change within the mid sigmoid colon.  Diverticula were noted in this area on prior study.  Findings most compatible with diverticulitis.  Small bowel is decompressed.  No free fluid, free air or adenopathy.  Uterus, adnexa urinary bladder grossly unremarkable.  IMPRESSION: Findings compatible with acute sigmoid diverticulitis.   Original Report Authenticated By: Charlett Nose, M.D.    Medications: I have reviewed  the patient's current medications. Scheduled Meds:   . allopurinol  300 mg Oral Daily  . aspirin EC  81 mg Oral Daily  . ciprofloxacin  400 mg Intravenous BID  . DULoxetine  80 mg Oral Daily  . enoxaparin (LOVENOX) injection  40 mg Subcutaneous Daily  . ferrous sulfate  325 mg Oral Q breakfast  . gabapentin  300 mg Oral TID  . insulin aspart  0-20 Units Subcutaneous TID WC  . insulin glargine  70 Units Subcutaneous BID  . metroNIDAZOLE  500 mg Intravenous Q8H  . mometasone-formoterol  2 puff Inhalation BID  . montelukast  10 mg Oral QHS  . nystatin   Topical TID  . olopatadine  1 drop Both Eyes BID  . pantoprazole  40 mg Oral Daily  . propranolol  20 mg Oral BID  . simvastatin  20 mg Oral q1800  . [DISCONTINUED] insulin aspart  6 Units Subcutaneous TID WC   Continuous Infusions:   . sodium chloride 150 mL/hr at 09/16/12 0847   PRN Meds:.acetaminophen, acetaminophen, albuterol, HYDROmorphone (DILAUDID) injection, ipratropium, ondansetron (ZOFRAN) IV, ondansetron, zolpidem, [DISCONTINUED]  HYDROmorphone (DILAUDID) injection, [DISCONTINUED]  HYDROmorphone (DILAUDID) injection Assessment/Plan: Ms. [name] Rice in 56yo F pmh morbid obesity, DM2 hgbA1c 6.8 on insulin, OSA on CPAP, HTN and depression admitted for worsening abdominal pain consistent with diverticulitis confirmed by CT on admission.  1. Diverticulitis: CT showed mid signomid colon inflammatory changes and wall thickening. Pt hx of diverticulosis confirmed by colonoscopy in 2010. Pt initial presentation SIRS 3/4 (leukocytosis, tachycardia, febrile) that is improving except leukocytosis seems to have increased today 11.4 --> 14.6 with left shift predominance. Pt clinically reports feeling better.  -cont NPO with ice chips may consider gradual increase to clear liquids given pts symptoms -cont dilaudid 1.5mg  q3h will work on decreasing frequency at some point soon as symptoms improving -cont IV Cipro and Flagyl -zofran prn    -protonix  -PT to evaluate and sit in chair today.  2. Urinary retention: pt has hx of urinary retention w/diverticulitis. CT showed no hydronephrosis or obstruction. Pt has PRV 900cc yesterday. Cr stable and pt asymptomatic. Pt was able to void yesterday. -PRV qshift -continue to evaluate  3. DM type 2 well controlled: last HgbA1c 6.8 (11/13). Home Lantus 70U BID, Novolog 60U TID, and metformin 1000mg  BID.  -hold metformin given imaging start back upon d/c  4. HTN: admission BP 186/70 most likely 2/2 to pain as this has resolved today BPs in 140/60s in light of adequate pain control.  - cont home propranolol  5. OSA: pt requires CPAP last sleep study not found per chart review. Per pt self report w/in last couple years with settings increased on machine. Pt only able to tolerate the nasal version 2/2 claustrophobia.  -cont CPAP -prn inhalers  6. Depression: pt  reported recent anhedonia w/in last 2 wks but that has resolved with new support network and current medication. No SI or HI at this time. -cont cymbalta  Dispo: Disposition is deferred at this time, awaiting improvement of current medical problems.  Anticipated discharge in approximately 2-3 day(s).   The patient does have a current PCP (SAWHNEY,MEGHA, MD), therefore will be requiring OPC follow-up after discharge.   The patient does not have transportation limitations that hinder transportation to clinic appointments.  .Services Needed at time of discharge: Y = Yes, Blank = No PT: Y  OT: N  RN: N  Equipment: none  Other:     LOS: 1 day   Christen Bame, MD PGY-1 [phone] 09/16/2012, 9:51 AM

## 2012-09-16 NOTE — Progress Notes (Signed)
Physical Therapy Evaluation Patient Details Name: [name] Rice MRN: [id number] DOB: [birth year] Today's Date: 09/16/2012 Time: 9528-4132 PT Time Calculation (min): 30 min  PT Assessment / Plan / Recommendation Clinical Impression  56 yo female admitted with abdominal pain secondary to diverticulitis, presents with decr activity tol and decr functional mobility; Will benefit from PT to maximize independence and safety with mobility and enable pt to dc home;  Of note, she reports she has no extra help at home, and will need to be completely modified independent in order to dc home; It is worth considering an OT consult while here to address ADLs    PT Assessment  Patient needs continued PT services    Follow Up Recommendations  Home health PT    Does the patient have the potential to tolerate intense rehabilitation      Barriers to Discharge Decreased caregiver support No help available to pt    Equipment Recommendations  None recommended by PT    Recommendations for Other Services OT consult   Frequency Min 3X/week    Precautions / Restrictions Precautions Precautions: Fall;Back (back prec for comfort; long history of low back pain)   Pertinent Vitals/Pain 9/10 back and abdominal pain; was premedicated; repositioned as comfortably as possible in chair      Mobility  Bed Mobility Bed Mobility: Rolling Right;Right Sidelying to Sit Rolling Right: 4: Min guard;With rail Right Sidelying to Sit: With rails;HOB elevated;3: Mod assist (HoB slightly elevated) Details for Bed Mobility Assistance: Pt reports typically logrolling to get OOB due to back pain; Needed mod physical assist (mostly handheld assist) to push trunk and shoulder off of bad Transfers Transfers: Sit to Stand;Stand to Sit Sit to Stand: 4: Min assist;From bed;With upper extremity assist Stand to Sit: 4: Min assist;With upper extremity assist;To chair/3-in-1 Details for Transfer Assistance: Cues for hand  placement, techique; Provided support at pt's Right elbow for sit to stand; cues to control descent with UEs for stand to sit Ambulation/Gait Ambulation/Gait Assistance: 4: Min assist Ambulation Distance (Feet): 3 Feet (pivot steps bed to chair) Assistive device: 1 person hand held assist Ambulation/Gait Assistance Details: Bil support given at pt's elbows during pivot steps bed to chair; pt declined RW use or further amb due to pain    Shoulder Instructions     Exercises     PT Diagnosis: Difficulty walking;Acute pain;Generalized weakness  PT Problem List: Decreased strength;Decreased activity tolerance;Decreased balance;Decreased mobility;Decreased knowledge of use of DME;Pain PT Treatment Interventions: DME instruction;Gait training;Functional mobility training;Therapeutic activities;Therapeutic exercise;Patient/family education;Balance training   PT Goals Acute Rehab PT Goals PT Goal Formulation: With patient Time For Goal Achievement: 09/30/12 Potential to Achieve Goals: Good Pt will Roll Supine to Right Side: with modified independence PT Goal: Rolling Supine to Right Side - Progress: Goal set today Pt will Roll Supine to Left Side: with modified independence PT Goal: Rolling Supine to Left Side - Progress: Goal set today Pt will go Supine/Side to Sit: with modified independence PT Goal: Supine/Side to Sit - Progress: Goal set today Pt will go Sit to Supine/Side: with modified independence PT Goal: Sit to Supine/Side - Progress: Goal set today Pt will go Sit to Stand: with modified independence PT Goal: Sit to Stand - Progress: Goal set today Pt will go Stand to Sit: with modified independence PT Goal: Stand to Sit - Progress: Goal set today Pt will Transfer Bed to Chair/Chair to Bed: with modified independence PT Transfer Goal: Bed to Chair/Chair to Bed - Progress: Goal  set today Pt will Ambulate: >150 feet;with modified independence;with rolling walker PT Goal: Ambulate -  Progress: Goal set today  Visit Information  Last PT Received On: 09/16/12 Assistance Needed: +1    Subjective Data  Subjective: Agreeable to OOB Patient Stated Goal: less pain   Prior Functioning  Home Living Lives With: Alone Available Help at Discharge:  None per pt Type of Home: Apartment (handicap accessible) Home Access: Level entry Home Layout: One level Bathroom Shower/Tub: Engineer, manufacturing systems: Standard Bathroom Accessibility: Yes How Accessible: Accessible via walker Home Adaptive Equipment: Shower chair with back;Walker - rolling;Walker - four wheeled;Bedside commode/3-in-1 (has reacher and toilet aid) Additional Comments: Pt did not indicate that she has any extra help at home since her daughter has moved out of town Prior Function Level of Independence: Independent with assistive device(s) Able to Take Stairs?: No Driving: Yes Comments: reports difficulty getting her RW in and out of the car; when she shops, she chooses stores with the motorized carts Communication Communication: No difficulties    Cognition  Overall Cognitive Status: Appears within functional limits for tasks assessed/performed Arousal/Alertness: Awake/alert Orientation Level: Appears intact for tasks assessed Behavior During Session: [facility] for tasks performed    Extremity/Trunk Assessment Right Upper Extremity Assessment RUE ROM/Strength/Tone: [place] for tasks assessed (for simple mobility tasks) Left Upper Extremity Assessment LUE ROM/Strength/Tone: WFL for tasks assessed Right Lower Extremity Assessment RLE ROM/Strength/Tone: Deficits;Due to pain RLE ROM/Strength/Tone Deficits: Needing some, though minimal anti-gravity physical assist for sit to stand Left Lower Extremity Assessment LLE ROM/Strength/Tone: Deficits;Due to pain LLE ROM/Strength/Tone Deficits: Needing some, though minimal, anti-gravity assist for sit to stand Trunk Assessment Trunk Assessment: Other  exceptions Trunk Exceptions: Longstanding Low back pain; had surgery in 2011, which relieved pain until pt fell approx 6 wks later   Balance    End of Session PT - End of Session Activity Tolerance: Patient tolerated treatment well;Patient limited by pain Patient left: in chair;with call bell/phone within reach Nurse Communication: Mobility status  GP Functional Assessment Tool Used: Clinical Judgement Functional Limitation: Mobility: Walking and moving around Mobility: Walking and Moving Around Current Status (O9629): At least 20 percent but less than 40 percent impaired, limited or restricted Mobility: Walking and Moving Around Goal Status [phone]): 0 percent impaired, limited or restricted   [facility], [place] [phone]  09/16/2012, 9:04 AM

## 2012-09-16 NOTE — Progress Notes (Signed)
Pt is requesting CPAP machine to help her sleep, says she uses one at home. MD notified. Will continue to monitor.

## 2012-09-17 ENCOUNTER — Encounter: Payer: Medicare Other | Admitting: Internal Medicine

## 2012-09-17 DIAGNOSIS — K5732 Diverticulitis of large intestine without perforation or abscess without bleeding: Secondary | ICD-10-CM | POA: Diagnosis not present

## 2012-09-17 LAB — CBC
Hemoglobin: 10.9 g/dL — ABNORMAL LOW (ref 12.0–15.0)
MCH: 25.6 pg — ABNORMAL LOW (ref 26.0–34.0)
MCHC: 31.3 g/dL (ref 30.0–36.0)

## 2012-09-17 LAB — GLUCOSE, CAPILLARY
Glucose-Capillary: 83 mg/dL (ref 70–99)
Glucose-Capillary: 85 mg/dL (ref 70–99)
Glucose-Capillary: 124 mg/dL — ABNORMAL HIGH (ref 70–99)

## 2012-09-17 LAB — BASIC METABOLIC PANEL
BUN: 10 mg/dL (ref 6–23)
GFR calc non Af Amer: 75 mL/min — ABNORMAL LOW (ref >90)
Glucose, Bld: 71 mg/dL (ref 70–99)
Potassium: 3.6 mEq/L (ref 3.5–5.1)

## 2012-09-17 MED ORDER — HYDROMORPHONE HCL PF 1 MG/ML IJ SOLN
0.5000 mg | INTRAMUSCULAR | Status: DC | PRN
  Administered 2012-09-17 – 2012-09-18 (×4): 1 mg via INTRAVENOUS
  Filled 2012-09-17 (×4): qty 1

## 2012-09-17 MED ORDER — DEXTROSE 50 % IV SOLN
INTRAVENOUS | Status: AC
  Administered 2012-09-17: 25 mL
  Filled 2012-09-17: qty 50

## 2012-09-17 MED ORDER — TRIAMCINOLONE ACETONIDE 0.1 % EX CREA
TOPICAL_CREAM | Freq: Two times a day (BID) | CUTANEOUS | Status: DC
  Administered 2012-09-17 – 2012-09-19 (×4): via TOPICAL
  Filled 2012-09-17: qty 15

## 2012-09-17 NOTE — Progress Notes (Signed)
09/17/2012 patient saturation on room air at rest was 77 to 85%. Patient ambulate with physical therapy and saturation was 82%. Oxygen was placed on patient while walking with physical therapy she was 92%. Biochemist, clinical.

## 2012-09-17 NOTE — ED Provider Notes (Signed)
Medical screening examination/treatment/procedure(s) were performed by non-physician practitioner and as supervising physician I was immediately available for consultation/collaboration.   Rolan Bucco, MD 09/17/12 2128

## 2012-09-17 NOTE — Progress Notes (Signed)
Pt continues to have difficulty urinating. Pt up to bedside commode, urinated 175 cc tea colored, malodorous urine. Post void residual 350 cc. MD notified, orders for one time straight cath given.

## 2012-09-17 NOTE — ED Provider Notes (Signed)
History     CSN: [id number]  Arrival date & time 09/14/12  0944   First MD Initiated Contact with Patient 09/14/12 1012      Chief Complaint  Patient presents with  . Abdominal Pain    (Consider location/radiation/quality/duration/timing/severity/associated sxs/prior treatment) Patient is a 56 y.o. female presenting with abdominal pain. The history is provided by the patient and a relative. No language interpreter was used.  Abdominal Pain The primary symptoms of the illness include abdominal pain, nausea and vomiting. The primary symptoms of the illness do not include fever, shortness of breath, diarrhea, dysuria or vaginal bleeding. The current episode started yesterday. The onset of the illness was gradual. The problem has been gradually worsening.  The patient has had a change in bowel habit. Symptoms associated with the illness do not include chills, diaphoresis, constipation, urgency, hematuria, frequency or back pain. Significant associated medical issues include GERD and diabetes. Associated medical issues comments: diverticulosis.  56yo female with LLQ abdominal pain intermittant since yesterday with n/v x 1 and soft stools.  Recent lipoma mass removed from her LUQ abdominal wall in September per Dr. Rayburn Ma.  Will procede with CT abdomen and pelvis with contrast.   Past Medical History  Diagnosis Date  . Anxiety   . Arthritis   . Asthma   . COPD (chronic obstructive pulmonary disease)   . Depression   . Diabetes mellitus   . GERD (gastroesophageal reflux disease)   . Hypertension   . HLD (hyperlipidemia)   . Dizziness   . History of colonic polyps   . Lumbar pain 2011    per x-ray - Multilevel spondylosis  . Cervical dystonia   . Tremor CCENTRAL NERVOUS SYSTEM  . Torn rotator cuff RT SHOULDER  . Dermatitis   . Diarrhea   . History of kidney stones   . Recurrent boils     "SLUSTER BOILS" ON ABD AND LABIA  . History of seborrheic dermatitis     face,ears  . OSA  on CPAP 2008  . Sleep apnea     USES C-PAP    Past Surgical History  Procedure Date  . Pilonidal cyst excision [id number]  . Tubal ligation   . Lumbar laminectomy   . Carpal tunnel release 2006    left  . Elbow surgery LEFT  . Knee arthroscopy LEFT  . Nasal sinus surgery   . Hand surgery RIGHT  . Joint replacement     rt shoulder  . Mass excision 06/28/2012    Procedure: EXCISION MASS;  Surgeon: Shelly Rubenstein, MD;  Location: [facility] OR;  Service: General;  Laterality: Left;  excision left upper quadrant abdominal wall mass, Benign Lipoma    Family History  Problem Relation Age of Onset  . Heart disease Brother   . Stroke Neg Hx   . Cancer Neg Hx     History  Substance Use Topics  . Smoking status: Former Games developer  . Smokeless tobacco: Former Neurosurgeon    Quit date: 07/08/1997  . Alcohol Use: No    OB History    Grav Para Term Preterm Abortions TAB SAB Ect Mult Living                  Review of Systems  Constitutional: Positive for appetite change. Negative for fever, chills and diaphoresis.  HENT: Negative.   Eyes: Negative.   Respiratory: Negative.  Negative for shortness of breath.   Cardiovascular: Negative.  Negative for leg swelling.  Gastrointestinal: Positive for  nausea, vomiting and abdominal pain. Negative for diarrhea, constipation and blood in stool.  Genitourinary: Negative for dysuria, urgency, frequency, hematuria and vaginal bleeding.  Musculoskeletal: Negative for back pain.  Skin: Negative.   Neurological: Negative.   Psychiatric/Behavioral: Negative.   All other systems reviewed and are negative.    Allergies  Ace inhibitors; Cefaclor; Shrimp; and Sulfonamide derivatives  Home Medications  No current outpatient prescriptions on file.  BP 150/55  Pulse 69  Temp 98.1 F (36.7 C) (Oral)  Resp 18  SpO2 97%  LMP 10/18/1995  Physical Exam  Nursing note and vitals reviewed. Constitutional: She is oriented to person, place, and time. She  appears well-developed and well-nourished.       Morbidly obese  HENT:  Head: Normocephalic and atraumatic.  Eyes: Conjunctivae normal and EOM are normal. Pupils are equal, round, and reactive to light.  Neck: Normal range of motion. Neck supple.  Cardiovascular: Normal rate, regular rhythm and normal heart sounds.   Pulmonary/Chest: Effort normal.  Abdominal: Soft.  Musculoskeletal: Normal range of motion. She exhibits no edema and no tenderness.  Neurological: She is alert and oriented to person, place, and time. She has normal reflexes.  Skin: Skin is warm and dry.  Psychiatric: She has a normal mood and affect.    ED Course  Procedures (including critical care time)  Labs Reviewed  CBC WITH DIFFERENTIAL - Abnormal; Notable for the following:    WBC 11.4 (*)     Hemoglobin 11.1 (*)     MCH 24.2 (*)     RDW 16.4 (*)     Neutrophils Relative 81 (*)     Neutro Abs 9.2 (*)     All other components within normal limits  COMPREHENSIVE METABOLIC PANEL - Abnormal; Notable for the following:    Potassium 3.3 (*)     Glucose, Bld 129 (*)     Albumin 3.2 (*)     GFR calc non Af Amer 76 (*)     GFR calc Af Amer 88 (*)     All other components within normal limits  URINALYSIS, ROUTINE W REFLEX MICROSCOPIC - Abnormal; Notable for the following:    Color, Urine AMBER (*)  BIOCHEMICALS MAY BE AFFECTED BY COLOR   Specific Gravity, Urine 1.033 (*)     Bilirubin Urine SMALL (*)     Ketones, ur 15 (*)     Protein, ur 30 (*)     All other components within normal limits  URINE MICROSCOPIC-ADD ON  LAB REPORT - SCANNED   No results found.   1. Diverticulitis       MDM  Morbidyl obese female with LLQ pain and + sigmoid diverticulitis on CT today reviewed by myself. Afebrile, VSS no tachycardia.  WBC 11.4. No severe pain or blood in stool.  Will treat on outpatient antibiotics with GI follow up this week. Hypokalemia treated in the ER.  IVF in the ER today.  Diet instructions on  discharge papers.  She understands to return for worsening symptoms.    Labs Reviewed  CBC WITH DIFFERENTIAL - Abnormal; Notable for the following:    WBC 11.4 (*)     Hemoglobin 11.1 (*)     MCH 24.2 (*)     RDW 16.4 (*)     Neutrophils Relative 81 (*)     Neutro Abs 9.2 (*)     All other components within normal limits  COMPREHENSIVE METABOLIC PANEL - Abnormal; Notable for the following:  Potassium 3.3 (*)     Glucose, Bld 129 (*)     Albumin 3.2 (*)     GFR calc non Af Amer 76 (*)     GFR calc Af Amer 88 (*)     All other components within normal limits  URINALYSIS, ROUTINE W REFLEX MICROSCOPIC - Abnormal; Notable for the following:    Color, Urine AMBER (*)  BIOCHEMICALS MAY BE AFFECTED BY COLOR   Specific Gravity, Urine 1.033 (*)     Bilirubin Urine SMALL (*)     Ketones, ur 15 (*)     Protein, ur 30 (*)     All other components within normal limits  URINE MICROSCOPIC-ADD ON  LAB REPORT - SCANNED         Remi Haggard, NP 09/17/12 1241

## 2012-09-17 NOTE — Progress Notes (Signed)
Pt is already wearing nasal cpap for rest.  11cm h2o per home settings with 2l o2 bleedin.  Sterile water already added to humidity chamber.  Pt is tolerating well at this time.  HR 81, sats 95%.

## 2012-09-17 NOTE — Progress Notes (Signed)
Internal Medicine Teaching Service Attending Note Date: 09/17/2012  Patient name: [name] Rice  Medical record number: [id number]  Date of birth: 1956-03-05    This patient has been seen and discussed with the house staff. Please see their note for complete details. I concur with their findings with the following additions/corrections: Pt c/o a lot of gas. Tolerated clears although caused gas. Had one small BM today. Urinating better although RN stated PVR 280cc. Pt has trouble getting herself into seated position and standing but once up, she is able to walk with walker indep and is steady. Dr Burtis Junes is modifying IVF, pain meds, and diet in response to her improvement. Likely D/C in 1-3 days (Dec 3rd -5th)   [facility] 09/17/2012, 4:34 PM

## 2012-09-17 NOTE — Progress Notes (Signed)
09/17/2012  md is aware patient saturation was 77 to 85%. And she is also aware patient have dizziness when she turn her head to the left.Biochemist, clinical.

## 2012-09-17 NOTE — Progress Notes (Signed)
Subjective: Pt doing much better today. Nausea resolving. Pt pain under much better control and requiring less frequent dilaudid overnight. Pt VSS. Pt able to urinate better and no reports of diarrhea. Pt ambulated to chair and commode and interested in advancing diet.   Objective: Vital signs in last 24 hours: Filed Vitals:   09/17/12 0644 09/17/12 1121 09/17/12 1149 09/17/12 1401  BP: 151/69  163/75 140/81  Pulse: 77  96 75  Temp: 97.6 F (36.4 C)     TempSrc: Oral     Resp: 20  20 18   Height:      Weight:      SpO2: 97% 96% 91% 94%   Weight change: 6 lb 8 oz (2.948 kg)  Intake/Output Summary (Last 24 hours) at 09/17/12 1415 Last data filed at 09/17/12 1300  Gross per 24 hour  Intake   1912 ml  Output   1125 ml  Net    787 ml   General: sitting up in bed, NAD, cheerful affect Cardiac: RRR, no rubs, murmurs or gallops Pulm: clear to auscultation bilaterally, moving normal volumes of air Abd: soft, obese, slight tenderness in LLQ, hyperactive BS Ext: warm and well perfused, no pedal edema Neuro: alert and oriented X3, cranial nerves II-XII grossly intact  Lab Results: Basic Metabolic Panel:  Lab 09/17/12 9604 09/16/12 0605  NA 143 139  K 3.6 4.2  CL 108 104  CO2 24 25  GLUCOSE 71 122*  BUN 10 9  CREATININE 0.84 0.79  CALCIUM 8.5 8.8  MG -- --  PHOS -- --   CBC:  Lab 09/17/12 0600 09/16/12 0605 09/15/12 0216  WBC 9.3 14.6* --  NEUTROABS -- 12.3* 7.6  HGB 10.9* 12.2 --  HCT 34.8* 40.0 --  MCV 81.9 82.6 --  PLT 193 224 --   CBG:  Lab 09/17/12 1223 09/17/12 0929 09/17/12 0755 09/16/12 2314 09/16/12 1740 09/16/12 1145  GLUCAP 85 83 61* 104* 88 114*   Medications: I have reviewed the patient's current medications. Scheduled Meds:    . allopurinol  300 mg Oral Daily  . aspirin EC  81 mg Oral Daily  . ciprofloxacin  400 mg Intravenous BID  . [COMPLETED] dextrose      . DULoxetine  80 mg Oral Daily  . enoxaparin (LOVENOX) injection  40 mg Subcutaneous  Daily  . ferrous sulfate  325 mg Oral Q breakfast  . gabapentin  300 mg Oral TID  . insulin aspart  0-20 Units Subcutaneous TID WC  . insulin glargine  70 Units Subcutaneous BID  . metroNIDAZOLE  500 mg Intravenous Q8H  . mometasone-formoterol  2 puff Inhalation BID  . montelukast  10 mg Oral QHS  . nystatin   Topical TID  . olopatadine  1 drop Both Eyes BID  . pantoprazole  40 mg Oral Daily  . propranolol  20 mg Oral BID  . simvastatin  20 mg Oral q1800  . triamcinolone cream   Topical BID   Continuous Infusions:    . sodium chloride 150 mL/hr at 09/17/12 0854   PRN Meds:.acetaminophen, acetaminophen, albuterol, HYDROmorphone (DILAUDID) injection, ipratropium, ondansetron (ZOFRAN) IV, ondansetron, [DISCONTINUED]  HYDROmorphone (DILAUDID) injection Assessment/Plan: Ms. Hagenow in 56yo F pmh morbid obesity, DM2 hgbA1c 6.8 on insulin, OSA on CPAP, HTN and depression admitted for worsening abdominal pain consistent with diverticulitis confirmed by CT on admission.  1. Diverticulitis: CT showed mid signomid colon inflammatory changes and wall thickening. Pt hx of diverticulosis confirmed by colonoscopy in 2010.  Pt initial presentation SIRS 3/4 (leukocytosis, tachycardia, febrile) that is improving except leukocytosis seems to have increased today 11.4 --> 14.6 with left shift predominance. Pt clinically reports feeling better.  -advance diet to Clears -cont dilaudid 0.5-1 mg q3h will work on increasing frequency at some point soon as symptoms improving -cont IV Cipro and Flagyl -zofran prn  -protonix  -PT to evaluate and sit in chair today and try ambulation.  2. Urinary retention: pt has hx of urinary retention w/diverticulitis. CT showed no hydronephrosis or obstruction. Pt PRV 300cc yesterday improved from previous 900cc. Cr stable and pt asymptomatic. Pt was able to void yesterday. -PRV qshift -continue to evaluate  3. DM type 2 well controlled: last HgbA1c 6.8 (11/13). Home Lantus  70U BID, Novolog 60U TID, and metformin 1000mg  BID.  -hold metformin given imaging start back upon d/c  4. HTN: admission BP 186/70 most likely 2/2 to pain as this has resolved today BPs in 140/60s in light of adequate pain control.  - cont home propranolol  5. OSA: pt requires CPAP last sleep study not found per chart review. Per pt self report w/in last couple years with settings increased on machine. Pt only able to tolerate the nasal version 2/2 claustrophobia.  -cont CPAP -prn inhalers  6. Depression: pt reported recent anhedonia w/in last 2 wks but that has resolved with new support network and current medication. No SI or HI at this time. -cont cymbalta  Dispo: Disposition is deferred at this time, awaiting improvement of current medical problems.  Anticipated discharge in approximately 2-3 day(s).   The patient does have a current PCP (SAWHNEY,MEGHA, MD), therefore will be requiring OPC follow-up after discharge.   The patient does not have transportation limitations that hinder transportation to clinic appointments.  .Services Needed at time of discharge: Y = Yes, Blank = No PT: Y  OT: N  RN: N  Equipment: none  Other:     LOS: 2 days   Christen Bame, MD PGY-1 [phone] 09/17/2012, 2:15 PM

## 2012-09-17 NOTE — Progress Notes (Signed)
Physical Therapy Treatment Patient Details Name: [name] Rice MRN: [id number] DOB: May 23, 1956 Today's Date: 09/17/2012 Time: 1535-1610 PT Time Calculation (min): 35 min  PT Assessment / Plan / Recommendation Comments on Treatment Session  Admitted with abdominal pain/diverticulitis; Of note, pt reports she has had BPPV in the past, and that she is experiencing the same dizziness today, especially with left head turns; Will plan to have PT look closer at Vestibular dysfunction next session; Overall improving mobility and activity tolerance, with ability to progress ambulation distance today (with supplemental O2)    Follow Up Recommendations  Home health PT     Does the patient have the potential to tolerate intense rehabilitation     Barriers to Discharge        Equipment Recommendations  None recommended by PT    Recommendations for Other Services OT consult  Frequency Min 3X/week   Plan Discharge plan remains appropriate    Precautions / Restrictions Precautions Precautions: Fall;Back (Back prec for comfort; long h/o LBP)   Pertinent Vitals/Pain Minimal pain today after amb  SATURATION QUALIFICATIONS: (This note is used to comply with regulatory documentation for home oxygen)  Patient Saturations on Room Air while Ambulating = 82%  Patient Saturations on 2 Liters of oxygen while Ambulating = 89-92%  Please briefly explain why patient needs home oxygen: Pt's O2 saturation drops to unacceptable levels with activity on room air    Mobility  Transfers Transfers: Sit to Stand;Stand to Sit Sit to Stand: 4: Min guard;From chair/3-in-1 (without physical contact) Stand to Sit: 5: Supervision;To chair/3-in-1;With armrests Details for Transfer Assistance: Cues for self-monitor for dizziness, shortnes of breath; Requiring less assist with transfers Ambulation/Gait Ambulation/Gait Assistance: 4: Min guard (without physical contact) Ambulation Distance (Feet): 140 Feet Assistive  device: Rolling walker Ambulation/Gait Assistance Details: Cues to self-monitor for shortness of breath, dizziness; Overall using RW well, slow-moving, but without loss of balance; O2 sats dropped with activity; See vitals section Gait Pattern: Decreased step length - right;Decreased step length - left    Exercises     PT Diagnosis:    PT Problem List:   PT Treatment Interventions:     PT Goals Acute Rehab PT Goals Time For Goal Achievement: 09/30/12 Potential to Achieve Goals: Good Pt will go Sit to Stand: with modified independence PT Goal: Sit to Stand - Progress: Progressing toward goal Pt will go Stand to Sit: with modified independence PT Goal: Stand to Sit - Progress: Progressing toward goal Pt will Transfer Bed to Chair/Chair to Bed: with modified independence PT Transfer Goal: Bed to Chair/Chair to Bed - Progress: Progressing toward goal Pt will Ambulate: >150 feet;with modified independence;with rolling walker PT Goal: Ambulate - Progress: Progressing toward goal  Visit Information  Last PT Received On: 09/17/12 Assistance Needed: +1    Subjective Data  Subjective: Wanting to walk; Reports her BPPV has returned -- mostly dizziness with Left head turn Patient Stated Goal: less pain   Cognition  Overall Cognitive Status: Appears within functional limits for tasks assessed/performed Arousal/Alertness: Awake/alert Orientation Level: Appears intact for tasks assessed Behavior During Session: [facility] for tasks performed    Balance     End of Session PT - End of Session Equipment Utilized During Treatment: Oxygen Activity Tolerance: Patient tolerated treatment well (still with DOE) Patient left: in chair;with call bell/phone within reach Nurse Communication: Mobility status   GP Functional Assessment Tool Used: Clinical Judgement Functional Limitation: Mobility: Walking and moving around Mobility: Walking and Moving Around Current Status (  R6045): At least 1 percent but  less than 20 percent impaired, limited or restricted Mobility: Walking and Moving Around Goal Status [phone]): 0 percent impaired, limited or restricted   Van Clines [facility] [place], [place] [phone]  09/17/2012, 4:45 PM

## 2012-09-18 ENCOUNTER — Ambulatory Visit: Payer: Medicare Other | Attending: Internal Medicine

## 2012-09-18 DIAGNOSIS — K5732 Diverticulitis of large intestine without perforation or abscess without bleeding: Secondary | ICD-10-CM | POA: Diagnosis not present

## 2012-09-18 LAB — CBC
HCT: 31.4 % — ABNORMAL LOW (ref 36.0–46.0)
Hemoglobin: 9.8 g/dL — ABNORMAL LOW (ref 12.0–15.0)
MCHC: 31.2 g/dL (ref 30.0–36.0)
MCV: 80.7 fL (ref 78.0–100.0)
Platelets: 174 10*3/uL (ref 150–400)
RBC: 3.89 MIL/uL (ref 3.87–5.11)
RDW: 16.6 % — ABNORMAL HIGH (ref 11.5–15.5)
WBC: 6.3 10*3/uL (ref 4.0–10.5)

## 2012-09-18 LAB — BASIC METABOLIC PANEL
CO2: 26 mEq/L (ref 19–32)
Chloride: 107 mEq/L (ref 96–112)
Creatinine, Ser: 0.96 mg/dL (ref 0.50–1.10)
GFR calc Af Amer: 74 mL/min — ABNORMAL LOW (ref >90)
Potassium: 3.4 mEq/L — ABNORMAL LOW (ref 3.5–5.1)
Sodium: 141 mEq/L (ref 135–145)

## 2012-09-18 LAB — GLUCOSE, CAPILLARY
Glucose-Capillary: 141 mg/dL — ABNORMAL HIGH (ref 70–99)
Glucose-Capillary: 184 mg/dL — ABNORMAL HIGH (ref 70–99)

## 2012-09-18 MED ORDER — CIPROFLOXACIN HCL 500 MG PO TABS
500.0000 mg | ORAL_TABLET | Freq: Two times a day (BID) | ORAL | Status: DC
  Administered 2012-09-18 – 2012-09-20 (×5): 500 mg via ORAL
  Filled 2012-09-18 (×7): qty 1

## 2012-09-18 MED ORDER — METRONIDAZOLE 500 MG PO TABS
500.0000 mg | ORAL_TABLET | Freq: Three times a day (TID) | ORAL | Status: DC
  Administered 2012-09-18 – 2012-09-20 (×7): 500 mg via ORAL
  Filled 2012-09-18 (×9): qty 1

## 2012-09-18 MED ORDER — HYDROCODONE-ACETAMINOPHEN 10-325 MG PO TABS
1.0000 | ORAL_TABLET | ORAL | Status: DC | PRN
  Administered 2012-09-18 (×3): 1 via ORAL
  Administered 2012-09-19: 2 via ORAL
  Administered 2012-09-19 (×2): 1 via ORAL
  Administered 2012-09-20 (×2): 2 via ORAL
  Administered 2012-09-20: 1 via ORAL
  Filled 2012-09-18 (×4): qty 1
  Filled 2012-09-18 (×3): qty 2
  Filled 2012-09-18 (×2): qty 1

## 2012-09-18 MED ORDER — SIMETHICONE 80 MG PO CHEW
80.0000 mg | CHEWABLE_TABLET | Freq: Four times a day (QID) | ORAL | Status: DC | PRN
  Administered 2012-09-18 – 2012-09-20 (×6): 80 mg via ORAL
  Filled 2012-09-18 (×7): qty 1

## 2012-09-18 NOTE — Progress Notes (Signed)
Occupational Therapy Evaluation Patient Details Name: [name] Rice MRN: [id number] DOB: 06/29/1956 Today's Date: 09/18/2012 Time: 4098-1191 OT Time Calculation (min): 29 min  OT Assessment / Plan / Recommendation Clinical Impression  56 yo with c/o abdominal pain/diarrhea. Pt with apparent sigmoid diverticulitis. Pt will benefit from skilled OT services to max independence with ADL and functional moiblity for ADL to facilitate D/C home. Rec HHOT at D/C.    OT Assessment  Patient needs continued OT Services    Follow Up Recommendations  Home health OT    Barriers to Discharge Decreased caregiver support    Equipment Recommendations  Other (comment) (Ae for toileting/ADL)    Recommendations for Other Services  none  Frequency  Min 2X/week    Precautions / Restrictions Precautions Precautions: Fall;Back Precaution Comments: ?BPPV Restrictions Weight Bearing Restrictions: No   Pertinent Vitals/Pain C/o occ back pain    ADL  Eating/Feeding: Independent Grooming: Supervision/safety Where Assessed - Grooming: Unsupported standing Upper Body Bathing: Supervision/safety;Set up Where Assessed - Upper Body Bathing: Unsupported sitting Lower Body Bathing: Minimal assistance Where Assessed - Lower Body Bathing: Unsupported sit to stand Upper Body Dressing: Set up Where Assessed - Upper Body Dressing: Unsupported sitting Lower Body Dressing: Minimal assistance Where Assessed - Lower Body Dressing: Unsupported sit to stand Toilet Transfer: Supervision/safety Toilet Transfer Method: Sit to Barista: Bedside commode Toileting - Clothing Manipulation and Hygiene: Maximal assistance Where Assessed - Engineer, mining and Hygiene: Sit to stand from 3-in-1 or toilet Equipment Used: Rolling walker Transfers/Ambulation Related to ADLs: S RW level ADL Comments: Mod I with AE at home. Pt expressed need for another toilet aid and a sponge so that she can  dry in between her toes and put lotion on her feet.    OT Diagnosis: Generalized weakness;Acute pain  OT Problem List: Decreased strength;Decreased activity tolerance;Impaired balance (sitting and/or standing);Decreased knowledge of use of DME or AE;Obesity;Pain OT Treatment Interventions: Self-care/ADL training;DME and/or AE instruction;Therapeutic activities;Patient/family education   OT Goals Acute Rehab OT Goals OT Goal Formulation: With patient Time For Goal Achievement: 10/02/12 Potential to Achieve Goals: Good ADL Goals Pt Will Perform Grooming: Unsupported;with adaptive equipment;Other (comment) (putting lotion on feet with AE/drying between toes.) ADL Goal: Grooming - Progress: Goal set today Pt Will Perform Toileting - Hygiene: with modified independence;with adaptive equipment;Sit to stand from 3-in-1/toilet;Standing at 3-in-1/toilet ADL Goal: Toileting - Hygiene - Progress: Goal set today Additional ADL Goal #1: Toilet transfer with Mod I ADL Goal: Additional Goal #1 - Progress: Goal set today  Visit Information  Last OT Received On: 09/18/12 Assistance Needed: +1    Subjective Data      Prior Functioning     Home Living Lives With: Alone Available Help at Discharge: Other (Comment) lives alone) Type of Home: Apartment (handicap accessible) Home Access: Level entry Home Layout: One level Bathroom Shower/Tub: Engineer, manufacturing systems: Standard Bathroom Accessibility: Yes How Accessible: Accessible via walker Home Adaptive Equipment: Shower chair with back;Walker - rolling;Walker - four wheeled;Bedside commode/3-in-1;Long-handled shoehorn;Long-handled sponge;Reacher;Sock aid (has reacher and toilet aid) Additional Comments: Pt did not indicate that she has any extra help at home since her daughter has moved out of town Prior Function Level of Independence: Independent with assistive device(s) Able to Take Stairs?: No Driving:  Yes Communication Communication: No difficulties Dominant Hand: Right         Vision/Perception     Cognition  Overall Cognitive Status: Appears within functional limits for tasks assessed/performed Arousal/Alertness: Awake/alert Orientation  Level: Appears intact for tasks assessed Behavior During Session: [facility] for tasks performed    Extremity/Trunk Assessment Right Upper Extremity Assessment RUE ROM/Strength/Tone: WFL for tasks assessed RUE Sensation: WFL - Light Touch;WFL - Proprioception RUE Coordination: WFL - gross/fine motor Left Upper Extremity Assessment LUE ROM/Strength/Tone: WFL for tasks assessed LUE Sensation: WFL - Light Touch;WFL - Proprioception LUE Coordination: WFL - gross/fine motor Right Lower Extremity Assessment RLE ROM/Strength/Tone: WFL for tasks assessed Left Lower Extremity Assessment LLE ROM/Strength/Tone: WFL for tasks assessed Trunk Assessment Trunk Assessment: Other exceptions (history of back problems)     Mobility Bed Mobility Bed Mobility: Sit to Supine Rolling Right: 6: Modified independent (Device/Increase time) Details for Bed Mobility Assistance: requires increased time Transfers Transfers: Sit to Stand;Stand to Sit Sit to Stand: 5: Supervision;With upper extremity assist;From chair/3-in-1 Stand to Sit: 5: Supervision;With upper extremity assist;To bed Details for Transfer Assistance: reprots dizziness with quick head tiurns     Shoulder Instructions     Exercise     Balance  S   End of Session OT - End of Session Activity Tolerance: Patient tolerated treatment well Patient left: in bed;with call bell/phone within reach Nurse Communication: Mobility status  GO Functional Assessment Tool Used: clinical judgement Functional Limitation: Self care Self Care Current Status (W0981): At least 20 percent but less than 40 percent impaired, limited or restricted Self Care Goal Status (X9147): At least 1 percent but less than 20  percent impaired, limited or restricted   [facility] 09/18/2012, 3:43 PM [facility], OTR/L  [phone] 09/18/2012

## 2012-09-18 NOTE — Progress Notes (Signed)
Internal Medicine Teaching Service Attending Note Date: 09/18/2012  Patient name: [name] Rice  Medical record number: [id number]  Date of birth: [birth year]    This patient has been seen and discussed with the house staff. Please see their note for complete details. I concur with their findings with the following additions/corrections: [name] was seen on AM rounds. She was sitting in a chair. If she tolerates a solid diet then she will be able to be D/C'd in the AM. She will need home PT/OT/O2. She states she will be able to drive to her OPC appts.  Jaleigh Mccroskey 09/18/2012, 1:46 PM

## 2012-09-18 NOTE — Progress Notes (Signed)
Subjective: Pt continues to greatly improve. Tolerating clears w/o n/v. Pt had loose stools yesterday w/o hematochezia or mucus. Pt not requiring a lot of IV pain medication and willing to transition to oral pain management. Pt tolerated ambulation with PT but did have episode of asymptomatic desat into 80%. Pt also complaining of some urinary retention this AM.  Objective: Vital signs in last 24 hours: Filed Vitals:   09/17/12 2002 09/17/12 2109 09/18/12 0700 09/18/12 0751  BP: 130/78  158/80   Pulse: 71  71   Temp: 97.8 F (36.6 C)  98.4 F (36.9 C)   TempSrc: Oral  Oral   Resp: 20  20   Height: 5\' 3"  (1.6 m)     Weight: 288 lb 11.2 oz (130.953 kg)     SpO2: 92% 95% 94% 94%   Weight change: 1 lb 9.6 oz (0.726 kg)  Intake/Output Summary (Last 24 hours) at 09/18/12 0848 Last data filed at 09/18/12 0700  Gross per 24 hour  Intake 1791.25 ml  Output    302 ml  Net 1489.25 ml   General: sitting up in bed, NAD, cheerful affect Cardiac: RRR, no rubs, murmurs or gallops Pulm: clear to auscultation bilaterally, moving normal volumes of air Abd: soft, obese, slight tenderness in LLQ, hyperactive BS Ext: warm and well perfused, no pedal edema Neuro: alert and oriented X3, cranial nerves II-XII grossly intact  Lab Results: Basic Metabolic Panel:  Lab 09/18/12 2130 09/17/12 0600  NA 141 143  K 3.4* 3.6  CL 107 108  CO2 26 24  GLUCOSE 93 71  BUN 8 10  CREATININE 0.96 0.84  CALCIUM 8.4 8.5  MG -- --  PHOS -- --   CBC:  Lab 09/18/12 0600 09/17/12 0600 09/16/12 0605 09/15/12 0216  WBC 6.3 9.3 -- --  NEUTROABS -- -- 12.3* 7.6  HGB 9.8* 10.9* -- --  HCT 31.4* 34.8* -- --  MCV 80.7 81.9 -- --  PLT 174 193 -- --   CBG:  Lab 09/18/12 0803 09/17/12 2213 09/17/12 1700 09/17/12 1223 09/17/12 0929 09/17/12 0755  GLUCAP 91 124* 90 85 83 61*   Medications: I have reviewed the patient's current medications. Scheduled Meds:    . allopurinol  300 mg Oral Daily  . aspirin EC   81 mg Oral Daily  . ciprofloxacin  500 mg Oral BID  . [COMPLETED] dextrose      . DULoxetine  80 mg Oral Daily  . enoxaparin (LOVENOX) injection  40 mg Subcutaneous Daily  . ferrous sulfate  325 mg Oral Q breakfast  . gabapentin  300 mg Oral TID  . insulin aspart  0-20 Units Subcutaneous TID WC  . insulin glargine  70 Units Subcutaneous BID  . metroNIDAZOLE  500 mg Oral Q8H  . mometasone-formoterol  2 puff Inhalation BID  . montelukast  10 mg Oral QHS  . nystatin   Topical TID  . olopatadine  1 drop Both Eyes BID  . pantoprazole  40 mg Oral Daily  . propranolol  20 mg Oral BID  . simvastatin  20 mg Oral q1800  . triamcinolone cream   Topical BID  . [DISCONTINUED] ciprofloxacin  400 mg Intravenous BID  . [DISCONTINUED] metroNIDAZOLE  500 mg Intravenous Q8H   Continuous Infusions:    . sodium chloride 75 mL/hr at 09/17/12 1638   PRN Meds:.acetaminophen, acetaminophen, albuterol, HYDROcodone-acetaminophen, ipratropium, ondansetron (ZOFRAN) IV, ondansetron, simethicone, [DISCONTINUED]  HYDROmorphone (DILAUDID) injection, [DISCONTINUED]  HYDROmorphone (DILAUDID) injection Assessment/Plan: [name] Rice  in 56yo F pmh morbid obesity, DM2 hgbA1c 6.8 on insulin, OSA on CPAP, HTN and depression admitted for worsening abdominal pain consistent with diverticulitis confirmed by CT on admission.  1. Diverticulitis: CT showed mid signomid colon inflammatory changes and wall thickening. Pt hx of diverticulosis confirmed by colonoscopy in 2010. Pt initial presentation SIRS 3/4 (leukocytosis, tachycardia, febrile) that is improving except leukocytosis seems to have increased today 11.4 --> 14.6 with left shift predominance. Pt clinically reports feeling better.  -advance diet to carb modified -d/c Dilaudid  -transition to oral Norco 10-325mg  Q4h -oral Cipro and flagyl -zofran prn  -protonix  -PT to continue theraphy  2. Urinary retention: pt has hx of urinary retention w/diverticulitis. CT showed  no hydronephrosis or obstruction. Pt PRV 300cc yesterday improved from previous 900cc. Cr stable and pt asymptomatic. Pt was able to void yesterday. -PRV qshift -I&O if PRV >700cc -continue to evaluate  3. DM type 2 well controlled: last HgbA1c 6.8 (11/13). Home Lantus 70U BID, Novolog 60U TID, and metformin 1000mg  BID.  -hold metformin given imaging start back upon d/c  4. HTN: BP still slightly elevated 150/80s despite having better pain management.  - cont home propranolol -may need to add another agent  5. OSA: pt requires CPAP last sleep study not found per chart review. Per pt self report w/in last couple years with settings increased on machine. Pt only able to tolerate the nasal version 2/2 claustrophobia.  -cont CPAP -prn inhalers  6. Depression: pt reported recent anhedonia w/in last 2 wks but that has resolved with new support network and current medication. No SI or HI at this time. -cont cymbalta  Dispo: Disposition is deferred at this time, awaiting improvement of current medical problems.  Anticipated discharge in approximately 2-3 day(s).   The patient does have a current PCP (SAWHNEY,MEGHA, MD), therefore will be requiring OPC follow-up after discharge.   The patient does not have transportation limitations that hinder transportation to clinic appointments.  .Services Needed at time of discharge: Y = Yes, Blank = No PT: Y  OT: N  RN: N  Equipment: none  Other:     LOS: 3 days   Christen Bame, MD PGY-1 [phone] 09/18/2012, 8:48 AM

## 2012-09-18 NOTE — Progress Notes (Signed)
Physical Therapy Treatment Patient Details Name: [name] Rice MRN: [id number] DOB: Oct 07, 1956 Today's Date: 09/18/2012 Time: 4098-1191 PT Time Calculation (min): 38 min  PT Assessment / Plan / Recommendation Comments on Treatment Session  Adm with abd pain/diverticulitis with recent return of BPPV symptoms. Unable to detect nystagmus with [facility] or with changes in position during Epley maneuver, however pt did describe spinning sensation. Will reassess pt on 12/04 a.m. prior to d/c in case further cannalith repositioning is warranted.    Follow Up Recommendations  Home health PT     Does the patient have the potential to tolerate intense rehabilitation     Barriers to Discharge        Equipment Recommendations  None recommended by PT    Recommendations for Other Services    Frequency Min 3X/week   Plan Frequency remains appropriate;Discharge plan remains appropriate    Precautions / Restrictions Precautions Precautions: Fall;Back (Back prec per prior PT due to chronic LBP; for comfort) Restrictions Weight Bearing Restrictions: No   Pertinent Vitals/Pain Lt abd pain not rated    Mobility  Bed Mobility Bed Mobility: Rolling Right;Right Sidelying to Sit;Sitting - Scoot to [place] of Bed;Sit to Supine Rolling Right: 4: Min guard (no rail, near EOB, therefore minguard) Right Sidelying to Sit: 3: Mod assist;HOB flat Sitting - Scoot to Edge of Bed: 6: Modified independent (Device/Increase time) Sit to Supine: 4: Min assist;HOB flat Details for Bed Mobility Assistance: side to sit from Rt as part of Epley maneuver; required incr assist due to positioning near EOB, HOB flat, no rail Transfers Transfers: Sit to Stand;Stand to Sit Sit to Stand: 5: Supervision;With upper extremity assist;With armrests;From bed;From chair/3-in-1 Stand to Sit: 6: Modified independent (Device/Increase time);With upper extremity assist;With armrests;To bed;To chair/3-in-1 Details for Transfer  Assistance: x2 transfers; supervision initially due to reported dizziness/BPPV; denied dizziness after Epley maneuver Ambulation/Gait Ambulation/Gait Assistance: 5: Supervision Ambulation Distance (Feet): 25 Feet Assistive device: Rolling walker Ambulation/Gait Assistance Details: to/from bed for BPPV testing Gait Pattern: Decreased stride length;Wide base of support    Exercises Other Exercises Other Exercises: Pt reports spinning sensation when she turns her head to the left (noted slight dizzy feeling with head turn to Rt, however felt it was due to recent pain medicine she took). Positioned pt in supine on bed with her head hanging off end of bed (foot board removed). Assisted pt to pull up to long-sitting and then performed Epley maneuver to treat Lt posterior canal BPPV. Pt symptomatic with each change in position (varied 25-45 seconds). Educated pt on sitting upright for 1-2 hours and no sudden head turns x 24 hrs.   PT Diagnosis:    PT Problem List:   PT Treatment Interventions:     PT Goals Acute Rehab PT Goals Pt will Roll Supine to Right Side: with modified independence PT Goal: Rolling Supine to Right Side - Progress: Progressing toward goal Pt will go Supine/Side to Sit: with modified independence PT Goal: Supine/Side to Sit - Progress: Progressing toward goal Pt will go Sit to Supine/Side: with modified independence PT Goal: Sit to Supine/Side - Progress: Progressing toward goal Pt will go Sit to Stand: with modified independence PT Goal: Sit to Stand - Progress: Progressing toward goal Pt will go Stand to Sit: with modified independence PT Goal: Stand to Sit - Progress: Partly met  Visit Information  Last PT Received On: 09/18/12 Assistance Needed: +1    Subjective Data  Subjective: Reports she has had Lt BPPV in  2011 and returned this past September. Had not been able to do the Brandt-Daroff exercises since her surgery and pain on Lt side. [name]   Cognition  Overall Cognitive Status: Appears within functional limits for tasks assessed/performed Arousal/Alertness: Awake/alert Orientation Level: Appears intact for tasks assessed Behavior During Session: [facility] for tasks performed    Balance     End of Session PT - End of Session Equipment Utilized During Treatment: Oxygen Activity Tolerance: Patient tolerated treatment well Patient left: in chair;with call bell/phone within reach   GP     [name] Rice 09/18/2012, 11:25 AM Pager [phone]

## 2012-09-18 NOTE — Progress Notes (Signed)
Pt set up and wished to place on herself but advised if she has difficulty to call rt

## 2012-09-19 DIAGNOSIS — K5732 Diverticulitis of large intestine without perforation or abscess without bleeding: Secondary | ICD-10-CM | POA: Diagnosis not present

## 2012-09-19 LAB — GLUCOSE, CAPILLARY
Glucose-Capillary: 102 mg/dL — ABNORMAL HIGH (ref 70–99)
Glucose-Capillary: 124 mg/dL — ABNORMAL HIGH (ref 70–99)

## 2012-09-19 LAB — BASIC METABOLIC PANEL
Calcium: 8.7 mg/dL (ref 8.4–10.5)
GFR calc non Af Amer: 73 mL/min — ABNORMAL LOW (ref >90)
Sodium: 139 mEq/L (ref 135–145)

## 2012-09-19 MED ORDER — HYDROCORTISONE 1 % EX CREA
TOPICAL_CREAM | CUTANEOUS | Status: DC | PRN
  Administered 2012-09-19: 19:00:00 via TOPICAL
  Filled 2012-09-19: qty 28

## 2012-09-19 MED ORDER — POTASSIUM CHLORIDE CRYS ER 20 MEQ PO TBCR
40.0000 meq | EXTENDED_RELEASE_TABLET | ORAL | Status: AC
  Administered 2012-09-19 (×2): 40 meq via ORAL
  Filled 2012-09-19 (×2): qty 2

## 2012-09-19 MED ORDER — HYDROCHLOROTHIAZIDE 25 MG PO TABS: 25.0000 mg | ORAL_TABLET | Freq: Every day | ORAL | Status: DC

## 2012-09-19 NOTE — Progress Notes (Signed)
Internal Medicine Teaching Service Attending Note Date: 09/19/2012  Patient name: [name] Rice  Medical record number: [id number]  Date of birth: 1956/01/07    This patient has been seen and discussed with the house staff. Please see their note for complete details. I concur with their findings with the following additions/corrections: Pt had a slight GI setback 2/2 overzealous PO intake after a regular diet was resumed. Pt now back to baseline. If stable in AM, D/C home with O2, PT, OT.   Alline Pio 09/19/2012, 5:01 PM

## 2012-09-19 NOTE — Progress Notes (Signed)
Physical Therapy Treatment Patient Details Name: [name] Rice MRN: [id number] DOB: 09-Sep-1956 Today's Date: 09/19/2012 Time: 4098-1191 PT Time Calculation (min): 55 min  PT Assessment / Plan / Recommendation Comments on Treatment Session  Pt admitted with abd pain/diverticulitis with recent treatment of BPPV symptoms.  Pt denies dizziness this morning, reports she may not discharge today due to continued abd pain.  Presents today with improved mobility and decreased pain upon standing and sitting in chair compared to supine. Will continue to benefit from skilled PT services to maximize mobility and function in acute setting.    Follow Up Recommendations  Home health PT           Equipment Recommendations  None recommended by PT       Frequency Min 3X/week   Plan Frequency remains appropriate;Discharge plan remains appropriate    Precautions / Restrictions Precautions Precautions: Fall;Back Restrictions Weight Bearing Restrictions: No   Pertinent Vitals/Pain HR 72-96 with activity O2% sats: 97% at rest, >93% with activity; [place] 2L Pain: 5-6/10 in supine, 4/10 after standing and during walking, 0/10 end of session sitting in chair    Mobility  Bed Mobility Bed Mobility: Rolling Right;Right Sidelying to Sit;Sitting - Scoot to Edge of Bed Rolling Right: 6: Modified independent (Device/Increase time) Right Sidelying to Sit: 6: Modified independent (Device/Increase time);HOB elevated Sitting - Scoot to Edge of Bed: 6: Modified independent (Device/Increase time) Details for Bed Mobility Assistance: req incr time, pt with increased pain in supine and with bed mobility Transfers Transfers: Sit to Stand;Stand to Sit;Stand Pivot Transfers Sit to Stand: 5: Supervision;With upper extremity assist;From bed Stand to Sit: 5: Supervision;With upper extremity assist;To chair/3-in-1 Stand Pivot Transfers: 5: Supervision;With armrests Details for Transfer Assistance: Denies dizziness with  transfer, pt stable, req incr time Ambulation/Gait Ambulation/Gait Assistance: 5: Supervision Ambulation Distance (Feet): 200 Feet Assistive device: Rolling walker Ambulation/Gait Assistance Details: Pt maneuvers RW well, carries full conversation with head turns, without lob, and maintains slowed gait speed.  Recognizes tired/incr pain and took 3 standing rest breaks Gait Pattern: Step-through pattern;Decreased stride length Gait velocity: decr General Gait Details: Pt normally walks with Rollator at home Stairs: No Wheelchair Mobility Wheelchair Mobility: No    Exercises Other Exercises Other Exercises: Ankle pumps; AROM, encouraged 10x per hour to decrease ankle swelling.      PT Goals Acute Rehab PT Goals PT Goal: Rolling Supine to Right Side - Progress: Met PT Goal: Supine/Side to Sit - Progress: Met PT Goal: Sit to Stand - Progress: Progressing toward goal PT Goal: Stand to Sit - Progress: Progressing toward goal PT Transfer Goal: Bed to Chair/Chair to Bed - Progress: Progressing toward goal PT Goal: Ambulate - Progress: Progressing toward goal  Visit Information  Last PT Received On: 09/19/12 Assistance Needed: +1    Subjective Data  Subjective: "I can't do anything today I'm in so much pain they are going to cath me"  "My dizziness feels better but I have just a little bit over night"  Patient Stated Goal: To feel better and return home   Cognition  Overall Cognitive Status: Appears within functional limits for tasks assessed/performed Arousal/Alertness: Awake/alert Orientation Level: Oriented X4 / Intact Behavior During Session: [facility] for tasks performed    Balance   Dynamic standing balance Support: supervision Comments: standing with UE support after using 3in1, while PT assist with cleaning back side. standing with UE support durring gait and rest breaks, without lob.  standing at sink to brush teeth,  Able to lean forward, used bilat. UE to cup  water and bring to mouth without lob.      End of Session PT - End of Session Equipment Utilized During Treatment: Oxygen Activity Tolerance: Patient limited by fatigue;Patient limited by pain Patient left: in chair;with call bell/phone within reach Nurse Communication: Mobility status   GP     Sharion Balloon 09/19/2012, 10:16 AM Sharion Balloon, SPT Acute Rehab Services [phone]

## 2012-09-19 NOTE — Progress Notes (Signed)
Subjective: Pt continues to improve but appears to have overindulged in eating yesterday and has had continual abdominal pain 2/2 to that. She denied any n/v/d but had several soft BM w/o hematochezia or mucus. She was able to tolerate po meds and received adequate pain control with current oral pain meds. Urinary retention continues to be a problem today and pt was unable to have I&O from night team nursing yesterday. This has caused some cramping and discomfort.   Objective: Vital signs in last 24 hours: Filed Vitals:   09/18/12 1000 09/18/12 1800 09/18/12 2040 09/19/12 0435  BP: 133/77 145/72 144/78 158/75  Pulse: 76 76 73 73  Temp: 98.6 F (37 C) 98.3 F (36.8 C) 98.8 F (37.1 C) 98 F (36.7 C)  TempSrc: Oral Oral Oral Oral  Resp: 18 18 20 20   Height:   5\' 3"  (1.6 m)   Weight:   291 lb 10.7 oz (132.3 kg)   SpO2: 94% 93% 95% 93%   Weight change: 2 lb 15.5 oz (1.347 kg)  Intake/Output Summary (Last 24 hours) at 09/19/12 0924 Last data filed at 09/19/12 0435  Gross per 24 hour  Intake    880 ml  Output      7 ml  Net    873 ml   General: sitting up in bed, NAD, slightly uncomfortable Cardiac: RRR, no rubs, murmurs or gallops Pulm: clear to auscultation bilaterally, moving normal volumes of air Abd: soft, obese, slight tenderness in LLQ, hyperactive BS Ext: warm and well perfused, no pedal edema Neuro: alert and oriented X3, cranial nerves II-XII grossly intact  Lab Results: CBG:  Lab 09/19/12 0808 09/18/12 2125 09/18/12 1703 09/18/12 1151 09/18/12 0803 09/17/12 2213  GLUCAP 123* 221* 184* 141* 91 124*   Medications: I have reviewed the patient's current medications. Scheduled Meds:    . allopurinol  300 mg Oral Daily  . aspirin EC  81 mg Oral Daily  . ciprofloxacin  500 mg Oral BID  . DULoxetine  80 mg Oral Daily  . enoxaparin (LOVENOX) injection  40 mg Subcutaneous Daily  . ferrous sulfate  325 mg Oral Q breakfast  . gabapentin  300 mg Oral TID  . insulin aspart   0-20 Units Subcutaneous TID WC  . insulin glargine  70 Units Subcutaneous BID  . metroNIDAZOLE  500 mg Oral Q8H  . mometasone-formoterol  2 puff Inhalation BID  . montelukast  10 mg Oral QHS  . nystatin   Topical TID  . olopatadine  1 drop Both Eyes BID  . pantoprazole  40 mg Oral Daily  . propranolol  20 mg Oral BID  . simvastatin  20 mg Oral q1800  . triamcinolone cream   Topical BID  . [DISCONTINUED] hydrochlorothiazide  25 mg Oral Daily   Continuous Infusions:    . sodium chloride 50 mL/hr at 09/19/12 0626   PRN Meds:.acetaminophen, acetaminophen, albuterol, HYDROcodone-acetaminophen, ipratropium, ondansetron (ZOFRAN) IV, ondansetron, simethicone Assessment/Plan: Ms. Colquhoun in 56yo F pmh morbid obesity, DM2 hgbA1c 6.8 on insulin, OSA on CPAP, HTN and depression admitted for worsening abdominal pain consistent with diverticulitis confirmed by CT on admission.  1. Diverticulitis: CT showed mid signomid colon inflammatory changes and wall thickening. Pt hx of diverticulosis confirmed by colonoscopy in 2010. Pt initial presentation SIRS 3/4 (leukocytosis, tachycardia, febrile) that is improving except leukocytosis seems to have increased today 11.4 --> 14.6 with left shift predominance. Pt clinically reports feeling better.  -advance diet to carb modified -oral Norco 10-325mg  Q4h -  oral Cipro and flagyl -zofran prn  -protonix  -PT to continue theraphy  2. Urinary retention: pt has hx of urinary retention w/diverticulitis. CT showed no hydronephrosis or obstruction. Pt PRV 300cc yesterday improved from previous 900cc. Cr stable and pt asymptomatic. Pt was able to void yesterday only once and didn't receive I&O overnight. -PRV qshift -I&O if PRV >700cc -continue to evaluate  3. DM type 2 well controlled: last HgbA1c 6.8 (11/13). Home Lantus 70U BID, Novolog 60U TID, and metformin 1000mg  BID.  -hold metformin given imaging start back upon d/c  4. HTN: BP still slightly elevated  150/80s despite having better pain management.  - cont home propranolol -may need to add another agent such as HCTZ will consider once urinary retention under better control  5. OSA: pt requires CPAP last sleep study not found per chart review. Per pt self report w/in last couple years with settings increased on machine. Pt only able to tolerate the nasal version 2/2 claustrophobia.  -cont CPAP -prn inhalers  6. Depression: pt reported recent anhedonia w/in last 2 wks but that has resolved with new support network and current medication. No SI or HI at this time. -cont cymbalta  Dispo: Disposition is deferred at this time, awaiting improvement of current medical problems.  Anticipated discharge in approximately 2-3 day(s).   The patient does have a current PCP (SAWHNEY,MEGHA, MD), therefore will be requiring OPC follow-up after discharge.   The patient does not have transportation limitations that hinder transportation to clinic appointments.  .Services Needed at time of discharge: Y = Yes, Blank = No PT: Y  OT: N  RN: N  Equipment: none  Other:     LOS: 4 days   Christen Bame, MD PGY-1 [phone] 09/19/2012, 9:24 AM

## 2012-09-19 NOTE — Progress Notes (Signed)
[name] Rice,PT Acute Rehabilitation [phone] (pager)  

## 2012-09-19 NOTE — Progress Notes (Signed)
Occupational Therapy Treatment Patient Details Name: [name] Rice MRN: [id number] DOB: 1955/12/25 Today's Date: 09/19/2012 Time: 7829-5621 OT Time Calculation (min): 24 min  OT Assessment / Plan / Recommendation Comments on Treatment Session Pt issued AE to assist with ADL. Completed all education. All further needs can be addressed by HHOT.    Follow Up Recommendations  Home health OT    Barriers to Discharge   none    Equipment Recommendations  None recommended by PT    Recommendations for Other Services  none  Frequency Min 2X/week   Plan Discharge plan remains appropriate    Precautions / Restrictions Precautions Precautions: None   Pertinent Vitals/Pain No c/o pain    ADL  Upper Body Bathing: Modified independent Lower Body Bathing: Modified independent Lower Body Dressing: Modified independent Toileting - Clothing Manipulation and Hygiene: Modified independent ADL Comments: Fabricated sponge for pt to use to dry in between toes to improve skin integrety and decrease skin breakdown. Also issued toilet helper to increase pt to Mod I level with hygiene after toileting.    OT Diagnosis:    OT Problem List:   OT Treatment Interventions:     OT Goals Acute Rehab OT Goals OT Goal Formulation: With patient Time For Goal Achievement: 10/02/12 Potential to Achieve Goals: Good ADL Goals Pt Will Perform Grooming: Unsupported;with adaptive equipment;Other (comment) ADL Goal: Grooming - Progress: Met Pt Will Perform Toileting - Hygiene: with modified independence;with adaptive equipment;Sit to stand from 3-in-1/toilet;Standing at 3-in-1/toilet ADL Goal: Toileting - Hygiene - Progress: Met Additional ADL Goal #1: Toilet transfer with Mod I ADL Goal: Additional Goal #1 - Progress: Met  Visit Information  Last OT Received On: 09/19/12 Assistance Needed: +1    Subjective Data      Prior Functioning       Cognition  Overall Cognitive Status: Appears within functional  limits for tasks assessed/performed Arousal/Alertness: Awake/alert Orientation Level: Oriented X4 / Intact Behavior During Session: [facility] for tasks performed    Mobility  Shoulder Instructions   S      Exercises      Balance  S   End of Session OT - End of Session Activity Tolerance: Patient tolerated treatment well Patient left: in chair;with call bell/phone within reach Nurse Communication: Mobility status  GO Functional Assessment Tool Used: clinical judgement Functional Limitation: Self care Self Care Current Status (H0865): At least 20 percent but less than 40 percent impaired, limited or restricted Self Care Goal Status (H8469): At least 1 percent but less than 20 percent impaired, limited or restricted Self Care Discharge Status [phone]): At least 1 percent but less than 20 percent impaired, limited or restricted   [facility] 09/19/2012, 6:14 PM [facility], OTR/L  [phone] 09/19/2012

## 2012-09-20 DIAGNOSIS — K6389 Other specified diseases of intestine: Secondary | ICD-10-CM

## 2012-09-20 LAB — BASIC METABOLIC PANEL
Calcium: 8.6 mg/dL (ref 8.4–10.5)
Creatinine, Ser: 0.82 mg/dL (ref 0.50–1.10)
GFR calc Af Amer: 90 mL/min — ABNORMAL LOW (ref >90)
GFR calc non Af Amer: 77 mL/min — ABNORMAL LOW (ref >90)
Glucose, Bld: 89 mg/dL (ref 70–99)
Potassium: 3.5 mEq/L (ref 3.5–5.1)

## 2012-09-20 MED ORDER — HYDROCODONE-ACETAMINOPHEN 10-325 MG PO TABS: 1.0000 | ORAL_TABLET | ORAL | Status: DC | PRN

## 2012-09-20 MED ORDER — AMLODIPINE BESYLATE 5 MG PO TABS: 5.0000 mg | ORAL_TABLET | Freq: Every day | ORAL | Status: DC

## 2012-09-20 MED ORDER — POTASSIUM CHLORIDE CRYS ER 20 MEQ PO TBCR
40.0000 meq | EXTENDED_RELEASE_TABLET | Freq: Once | ORAL | Status: AC
  Administered 2012-09-20: 40 meq via ORAL
  Filled 2012-09-20: qty 2

## 2012-09-20 MED ORDER — CIPROFLOXACIN HCL 500 MG PO TABS: 500.0000 mg | ORAL_TABLET | Freq: Two times a day (BID) | ORAL | Status: DC

## 2012-09-20 MED ORDER — METRONIDAZOLE 500 MG PO TABS: 500.0000 mg | ORAL_TABLET | Freq: Three times a day (TID) | ORAL | Status: DC

## 2012-09-20 NOTE — Progress Notes (Addendum)
SATURATION QUALIFICATIONS:  Patient Saturations on Room Air at Rest = 89%  Patient Saturations on Room Air while ambulating = 83%  Patient Saturations on 2 Liters of oxygen while Ambulating = 94%

## 2012-09-20 NOTE — Progress Notes (Signed)
PT Cancellation Note  Patient Details Name: [name] [name] MRN: [id number] DOB: [birth year]   Cancelled Treatment:    Reason Eval/Treat Not Completed: Other (comment) (Anticipate D/C and declining PT at this time.  )   Alonna Bartling, Alison Murray 09/20/2012, 1:55 PM

## 2012-09-20 NOTE — Care Management Note (Signed)
   CARE MANAGEMENT NOTE 09/20/2012  Patient:  [name], [name]   Account Number:  [id number]  Date Initiated:  09/20/2012  Documentation initiated by:  [name] [name]  Subjective/Objective Assessment:   Order for home oxygen @ 2l/m nasal cannula     Action/Plan:   New O2 sats on room air and on 2l/min ambulating recored, notified [facility] representative, [name] [name]. Will clarify diagnosis resulting in need for home oxygen.   Anticipated DC Date:  09/20/2012   Anticipated DC Plan:  HOME/SELF CARE         Choice offered to / List presented to:     DME arranged  [facility]      DME agency  [facility]        Status of service:  Completed, signed off Medicare Important Message given?   (If response is "NO", the following Medicare IM given date fields will be blank) Date Medicare IM given:   Date Additional Medicare IM given:    Discharge Disposition:  HOME/SELF CARE  Per UR Regulation:    If discussed at Long Length of Stay Meetings, dates discussed:    Comments:

## 2012-09-20 NOTE — Progress Notes (Signed)
Pt portable O2 delivered to room and to receive home o2 within 2 hours

## 2012-09-20 NOTE — Progress Notes (Signed)
Pt. discharged to home. Pt after visit summary reviewed and pt capable of re verbalizing medications and follow up appointments. Pt remains stable. No signs and symptoms of distress. Educated to return to ER in the event of SOB, dizziness, chest pain, or fainting. Ritta Hammes, RN  

## 2012-09-20 NOTE — Progress Notes (Signed)
Internal Medicine Teaching Service Attending Note Date: 09/20/2012  Patient name: [name] [name]  Medical record number: [id number]  Date of birth: 12/25/1955    This patient has been seen and discussed with the house staff. Please see their note for complete details. I concur with their findings with the following additions/corrections: MS Offner was seen on AM rounds. She described several months of urgent, post prandial watery incontinent BM's. Since being placed on Cipro and Flagyl, these sxs have resolved. This is quite supportive of a dx of SIBO. She was educated that the sxs may come back anywhere from weeks to years later and that she would need to let her MD know so that she can be placed on another ABX course when sxs recur. She hasn't had a Vit B12 for years so that needs checked. Ferritin and Vit D are low possibly indicating malabsorption. No need for hydrogen breath test as on ABX and the inherent low sens and spec of these tests.   Klyde Banka 09/20/2012, 12:53 PM

## 2012-09-20 NOTE — Discharge Summary (Signed)
Internal Medicine Teaching [facility] Discharge Note  Name: [name] [name] MRN: [id number] DOB: December 14, 1955 56 y.o.  Date of Admission: 09/15/2012  1:49 AM Date of Discharge: 09/20/2012 Attending Physician: No att. providers found  Discharge Diagnosis: Principal Problem:  *Sigmoid diverticulitis Active Problems:  Diabetes mellitus type II, controlled  Essential hypertension  OSA on CPAP  Acute-on-chronic kidney disease Stage II  Intestinal bacterial overgrowth   Discharge Medications:   Medication List     As of 09/20/2012  5:03 PM    STOP taking these medications         HYDROcodone-acetaminophen 5-325 MG per tablet   Commonly known as: NORCO/VICODIN      oxyCODONE-acetaminophen 5-325 MG per tablet   Commonly known as: PERCOCET/ROXICET      TAKE these medications         ADVAIR DISKUS 250-50 MCG/DOSE Aepb   Generic drug: Fluticasone-Salmeterol   Inhale 1 puff into the lungs 2 (two) times daily.      allopurinol 300 MG tablet   Commonly known as: ZYLOPRIM   TAKE 1 TABLET BY MOUTH DAILY      amLODipine 5 MG tablet   Commonly known as: NORVASC   Take 1 tablet (5 mg total) by mouth daily.      B-D ECLIPSE SYRINGE 30G X 1/2" 1 ML Misc   Generic drug: Syringe/Needle (Disp)   1 Syringe by Does not apply route 3 (three) times daily.      B-D INS SYR ULTRAFINE 1CC/30G 30G X 1/2" 1 ML Misc   Generic drug: Insulin Syringe-Needle U-100   USE AS DIRECTED WITH EVERY MEAL AND AT BEDTIME      ciprofloxacin 500 MG tablet   Commonly known as: CIPRO   Take 1 tablet (500 mg total) by mouth 2 (two) times daily.      DULoxetine 20 MG capsule   Commonly known as: CYMBALTA   Take 20 mg by mouth daily. With 60mg  capsule = 80mg       DULoxetine 60 MG capsule   Commonly known as: CYMBALTA   Take 60 mg by mouth daily. With 20mg  capsule = 80mg       ferrous sulfate 325 (65 FE) MG tablet   Take 1 tablet (325 mg total) by mouth daily.      furosemide 40 MG tablet   Commonly  known as: LASIX   TAKE 1 TABLET BY MOUTH DAILY      gabapentin 300 MG capsule   Commonly known as: NEURONTIN   Take 1 capsule (300 mg total) by mouth 3 (three) times daily.      glucose blood test strip   1 each by Other route 3 (three) times daily before meals. Use as instructed      HYDROcodone-acetaminophen 10-325 MG per tablet   Commonly known as: NORCO   Take 1-2 tablets by mouth every 4 (four) hours as needed.      ibuprofen 200 MG tablet   Commonly known as: ADVIL,MOTRIN   Take 1 tablet (200 mg total) by mouth every 8 (eight) hours as needed for pain.      insulin aspart 100 UNIT/ML injection   Commonly known as: novoLOG   Inject 60 Units into the skin 3 (three) times daily before meals.      insulin glargine 100 UNIT/ML injection   Commonly known as: LANTUS   Inject 70 Units into the skin 2 (two) times daily.      metFORMIN 1000 MG tablet  Commonly known as: GLUCOPHAGE   Take 1 tablet (1,000 mg total) by mouth 2 (two) times daily with a meal.      methocarbamol 500 MG tablet   Commonly known as: ROBAXIN   Take 500 mg by mouth 2 (two) times daily.      metroNIDAZOLE 500 MG tablet   Commonly known as: FLAGYL   Take 1 tablet (500 mg total) by mouth every 8 (eight) hours.      montelukast 10 MG tablet   Commonly known as: SINGULAIR   Take 1 tablet (10 mg total) by mouth at bedtime.      NASONEX 50 MCG/ACT nasal spray   Generic drug: mometasone   USE 2 SPRAYS IN EACH NOSTRIL THE DAILY      olopatadine 0.1 % ophthalmic solution   Commonly known as: PATANOL   Place 1 drop into both eyes 2 (two) times daily as needed. Itching eyes      ondansetron 4 MG tablet   Commonly known as: ZOFRAN   Take 1 tablet (4 mg total) by mouth every 6 (six) hours.      ONETOUCH DELICA LANCETS FINE Misc   1 each by Does not apply route 4 (four) times daily.      pantoprazole 40 MG tablet   Commonly known as: PROTONIX   Take 1 tablet (40 mg total) by mouth daily.       pravastatin 40 MG tablet   Commonly known as: PRAVACHOL   Take 1 tablet (40 mg total) by mouth daily.      propranolol 20 MG tablet   Commonly known as: INDERAL   Take 1 tablet (20 mg total) by mouth 2 (two) times daily.      SELSUN BLUE DAILY 1 % Lotn   Generic drug: selenium sulfide   Apply 1 application topically once a week. For face      VENTOLIN HFA 108 (90 BASE) MCG/ACT inhaler   Generic drug: albuterol   Inhale 2 puffs into the lungs every 6 (six) hours as needed. Wheezing        zolpidem 5 MG tablet   Commonly known as: AMBIEN   TAKE ONE TABLET BY MOUTH AT BEDTIME AS NEEDED FOR SLEEP          Disposition and follow-up:   [name] was discharged from [facility] in {Stablecondition.  At the hospital follow up visit please address completion of ABx, home O2 use, HTN since amlodipine was added.   Follow-up Appointments:  Discharge Orders    Future Appointments: Provider: Department: Dept Phone: Center:   09/26/2012 9:00 AM Priscella Mann, DO [facility] [phone] [facility]      Procedures Performed:  Ct Abdomen Pelvis W Contrast  09/14/2012  *RADIOLOGY REPORT*  Clinical Data: Left lower quadrant pain.  CT ABDOMEN AND PELVIS WITH CONTRAST  Technique:  Multidetector CT imaging of the abdomen and pelvis was performed following the standard protocol during bolus administration of intravenous contrast.  Contrast: OMNIPAQUE IOHEXOL 300 MG/ML  SOLN  Comparison: 11/27/2007  Findings: Lung bases are clear.  No effusions.  Heart is normal size.  Suspect slight fatty infiltration of the liver.  No focal abnormality.  Stomach, gallbladder, spleen, pancreas, adrenals and right kidney are unremarkable.  Benign-appearing cyst in the lower pole of the left kidney measuring 2.7 cm, slightly larger than 2009 when this measured 1.6 cm.  No hydronephrosis.  Appendix is visualized and is normal.  There is wall thickening and  inflammatory change within the mid sigmoid colon.  Diverticula were noted in this area on prior study.  Findings most compatible with diverticulitis.  Small bowel is decompressed.  No free fluid, free air or adenopathy.  Uterus, adnexa urinary bladder grossly unremarkable.  IMPRESSION: Findings compatible with acute sigmoid diverticulitis.   Original Report Authenticated By: Charlett Nose, M.D.    Admission HPI: Ms. [name] [name] is a morbidly obese 56 year old white female with PMH of Depression, COPD, OSA, HTN, DM II, HLD,nephrolithiasis, and chronic back pain presenting to the ED today with complaints of worsening crampy abdominal pain x2 days duration. She claims she started to not feel well on Wednesday and did not get out of bed all day, she then woke up at 5am Thursday morning 09/13/12, had soiled herself, and had severe crampy diffuse abdominal pain. The pain was associated with a feeling of bowel pressure and having to constantly go to the restroom every 10 minutes where she would pass small soft BMs but not much urination. The pain is 10+, crampy, started left lower abdominal/groin region but quickly became diffuse, worse with movement, improved with eating, radiating to b/l lower back and groin, associated with nausea but no vomiting. Tmax 99.31F at home yesterday. Due to the pain on Thursday, Ms. [name] [name] came to the ED for evaluation and was found to have likely sigmoid diverticulitis on CT scan. She was subsequently discharged home on a course of ciprofloxacin and metronidazole and was also given percocet and zofran. On the way home, Ms. [name] [name] stopped to get some roast chicken, had a few bites, and again had to go to the bathroom, this time with mucousy--white/stringy BMs. She also started noticing bright red streaks on her toilet paper when wiping and small speckles of blood in her stool which she attributes to her history of hemorrhoids. She also reports subjective fevers, chills, nausea, and headaches, but  denies any chest pain, vomiting, shortness of breath, constipation, dysuria or any other urinary complaints at this time.  Of note, she is followed in [facility] by Dr. Coralyn Pear. She was initially diagnoses with diverticulosis 6 months prior and her last colonoscopy was approximately 4-5 years ago where she reports some polyps were removed by Dr. Marina Goodell.   Hospital Course by problem list:  1.Sigmoid Diverticulitis--confirmed on CT abdomen 09/14/12: mid sigmoid colon wall thickening and inflammatory change. Hx of diverticulosis diagnosed prior by colonoscopy in 2010. Pt initial presentation SIRS 3/4 (leukocytosis, tachycardia, febrile) that improved throughout admission and was completely resolved upon d/c. Pt was originally treated with IV cipro and flagyl and pain controlled with IV dilaudid. Pt initially required higher doses of dialudid and then successfully transitioned to oral Norco. Pt was continued with 5 more days of cipro and flagyl to complete a 10 day Abx course. Norco was also given for pain. Pt described classic symptoms of small bowel bacterial overgrowth and that was improving upon discharge as well...may need to consider in the future if pt continues or happens to have return of stool incontinence. She hasn't had VB12 checked in several years, but had Vit D and ferritin levels that have been low recently indicating some malabsorption.   2. Urinary retention: pt has hx of urinary retention w/diverticulitis. CT showed no hydronephrosis or obstruction. Pt had episode of significant PRV 900cc that required I&O cath. Pt was able to urinate w/o problem upon discharge. Pt has hx of uric acid and calcium oxalate stones in the past and  was under Urologist care until 2 years ago when they [place] out of [place]. Pt is wanting to re-establish care with a urologist at some point.   3. DM type 2 well controlled: last HgbA1c 6.8 (11/13). Home Lantus 70U BID, Novolog 60U TID, and metformin 1000mg  BID. Metformin was  with held during admission given contrast required for CT and restarted upon d/c.    4. HTN: BP was slightly elevated 150/80s despite having better pain management. Pt was continued on home propranolol and another agent is needed such as HCTZ but were not started 2/2 to urinary retention issues throughout admission. Pt apparently had hx of hypotension with ACEi.  5. OSA: pt requires CPAP last sleep study not found per chart review. Per pt self report w/in last couple years with settings increased on machine. Pt only able to tolerate the nasal version 2/2 claustrophobia. Pt did desat during ambulation with PT on occasion as low as 77% and returned to good O2 sat when returned on O2 (94%). Pt was d/c on home O2.   6. Depression: pt reported recent anhedonia w/in last 2 wks but that has resolved with new support network and current medication. No SI or HI during admission. Cymbalta was continued.    Discharge Vitals:  BP 154/75  Pulse 64  Temp 98.2 F (36.8 C) (Oral)  Resp 20  Ht 5\' 3"  (1.6 m)  Wt 295 lb 13.7 oz (134.2 kg)  BMI 52.41 kg/m2  SpO2 94%  LMP 10/18/1995 General: sitting up in bed, NAD, slightly uncomfortable  Cardiac: RRR, no rubs, murmurs or gallops  Pulm: clear to auscultation bilaterally, moving normal volumes of air  Abd: soft, obese, slight tenderness in LLQ, hyperactive BS  Ext: warm and well perfused, no pedal edema  Neuro: alert and oriented X3, cranial nerves II-XII grossly intact  Discharge Labs:  Results for orders placed during the hospital encounter of 09/15/12 (from the past 24 hour(s))  GLUCOSE, CAPILLARY     Status: Abnormal   Collection Time   09/19/12 10:03 PM      Component Value Range   Glucose-Capillary 124 (*) 70 - 99 mg/dL  BASIC METABOLIC PANEL     Status: Abnormal   Collection Time   09/20/12  5:44 AM      Component Value Range   Sodium 140  135 - 145 mEq/L   Potassium 3.5  3.5 - 5.1 mEq/L   Chloride 105  96 - 112 mEq/L   CO2 25  19 - 32 mEq/L    Glucose, Bld 89  70 - 99 mg/dL   BUN 8  6 - 23 mg/dL   Creatinine, Ser 4.54  0.50 - 1.10 mg/dL   Calcium 8.6  8.4 - 09.8 mg/dL   GFR calc non Af Amer 77 (*) >90 mL/min   GFR calc Af Amer 90 (*) >90 mL/min  OCCULT BLOOD X 1 CARD TO LAB, STOOL     Status: Normal   Collection Time   09/20/12  6:04 AM      Component Value Range   Fecal Occult Bld NEGATIVE    GLUCOSE, CAPILLARY     Status: Normal   Collection Time   09/20/12  7:36 AM      Component Value Range   Glucose-Capillary 91  70 - 99 mg/dL  GLUCOSE, CAPILLARY     Status: Normal   Collection Time   09/20/12 12:25 PM      Component Value Range   Glucose-Capillary 97  70 - 99 mg/dL    Signed: Christen Bame 09/20/2012, 5:03 PM   Time Spent on Discharge: 35 min Services Ordered on Discharge: home PT/OT Equipment Ordered on Discharge: home O2

## 2012-09-20 NOTE — Care Management Note (Signed)
Pt to d/c to home on Oxygen @ 2l/min per nasal cannula. Pt with hx of COPD, resulting in O2 sats of 89% on room air at rest and 83% on room air when attempting to ambulate. O2 sat 92% when O2 applied at 2l/min.   Johny Shock RN MPH Case manager [phone]

## 2012-09-21 ENCOUNTER — Telehealth: Payer: Self-pay | Admitting: *Deleted

## 2012-09-21 ENCOUNTER — Telehealth: Payer: Self-pay | Admitting: Licensed Clinical Social Worker

## 2012-09-21 NOTE — Telephone Encounter (Signed)
Discharge date: 09/20/2012  Hospital follow up appointment date: 09/26/2012  Calling to assist with transition of care from hospital to home.  Discharge medications reviewed: Yes. Pt voiced concern that she did not receive a prescription for Flatulence.  Ms. Cisek states something was provided to her in the hospital but was not d/c on anything.  Able to fill all prescriptions? Yes.  Patient aware of hospital follow up appointments. Yes.  No problems with transportation. Unable to discuss, due to pt's concern for current diarrhea.  Ms. Lemaster states she has been unable to make it to her kitchen due to diarrhea every 10 min and constant cramping.  Pt states she is limited to her bedroom.  Ms. Curvin is currently using a bedside commode and is able to complete personal care afterwards.  CSW will forward to triage RN as pt voiced concern regarding unexpected diarrhea and flatulent medication.  Other problems/concerns:

## 2012-09-21 NOTE — Telephone Encounter (Signed)
Returned pt's call. Pt stated since she was d/c from the hospital yesterday she has been having cramps(every 5 mins this morning) then Diarrhea. States Hydrocodone has helped but makes her sleepy; she is taking Flagyl as prescribed.  Wants to know if she can get rx for "the flatulent medication" she received in the hospital.  Called and talked to Dr Burtis Junes - she said pt had Gas-X in the hospital and can buy it OTC. Call pt back - she agrees to try Gas-X. And will call back it symptoms do not improve.

## 2012-09-26 ENCOUNTER — Ambulatory Visit: Payer: Medicare Other | Admitting: Internal Medicine

## 2012-09-30 ENCOUNTER — Other Ambulatory Visit: Payer: Self-pay | Admitting: Internal Medicine

## 2012-10-02 ENCOUNTER — Encounter: Payer: Self-pay | Admitting: Internal Medicine

## 2012-10-02 ENCOUNTER — Ambulatory Visit (INDEPENDENT_AMBULATORY_CARE_PROVIDER_SITE_OTHER): Payer: Medicare Other | Admitting: Internal Medicine

## 2012-10-02 ENCOUNTER — Other Ambulatory Visit (HOSPITAL_COMMUNITY)
Admission: RE | Admit: 2012-10-02 | Discharge: 2012-10-02 | Disposition: A | Discharge status: Home / Self Care | Payer: Medicare Other | Source: Ambulatory Visit | Attending: Internal Medicine | Admitting: Internal Medicine

## 2012-10-02 ENCOUNTER — Other Ambulatory Visit: Payer: Self-pay | Admitting: Internal Medicine

## 2012-10-02 ENCOUNTER — Ambulatory Visit: Payer: Medicare Other

## 2012-10-02 VITALS — BP 136/78 | HR 100 | Temp 97.2°F | Ht 63.0 in | Wt 270.8 lb

## 2012-10-02 DIAGNOSIS — Z0000 Encounter for general adult medical examination without abnormal findings: Secondary | ICD-10-CM | POA: Diagnosis not present

## 2012-10-02 DIAGNOSIS — E119 Type 2 diabetes mellitus without complications: Secondary | ICD-10-CM

## 2012-10-02 DIAGNOSIS — K909 Intestinal malabsorption, unspecified: Secondary | ICD-10-CM

## 2012-10-02 DIAGNOSIS — Z124 Encounter for screening for malignant neoplasm of cervix: Secondary | ICD-10-CM | POA: Insufficient documentation

## 2012-10-02 DIAGNOSIS — K5732 Diverticulitis of large intestine without perforation or abscess without bleeding: Secondary | ICD-10-CM

## 2012-10-02 DIAGNOSIS — Z1151 Encounter for screening for human papillomavirus (HPV): Secondary | ICD-10-CM | POA: Insufficient documentation

## 2012-10-02 DIAGNOSIS — E559 Vitamin D deficiency, unspecified: Secondary | ICD-10-CM | POA: Diagnosis not present

## 2012-10-02 DIAGNOSIS — I10 Essential (primary) hypertension: Secondary | ICD-10-CM

## 2012-10-02 DIAGNOSIS — B379 Candidiasis, unspecified: Secondary | ICD-10-CM

## 2012-10-02 LAB — CBC
HCT: 39.2 % (ref 36.0–46.0)
Hemoglobin: 12.3 g/dL (ref 12.0–15.0)
MCH: 24.5 pg — ABNORMAL LOW (ref 26.0–34.0)
MCHC: 31.4 g/dL (ref 30.0–36.0)
Platelets: 294 10*3/uL (ref 150–400)
RBC: 5.02 MIL/uL (ref 3.87–5.11)

## 2012-10-02 LAB — GLUCOSE, CAPILLARY: Glucose-Capillary: 139 mg/dL — ABNORMAL HIGH (ref 70–99)

## 2012-10-02 LAB — VITAMIN B12: Vitamin B-12: 554 pg/mL (ref 211–911)

## 2012-10-02 MED ORDER — FLUCONAZOLE 150 MG PO TABS: ORAL_TABLET | ORAL | Status: DC

## 2012-10-02 NOTE — Patient Instructions (Addendum)
General Instructions: Please schedule a follow up appointment in 1 month or sooner if needed . Please bring your medication bottles with your next appointment. Please take your medicines as prescribed. I will call you with your lab results if anything will be abnormal. I will cal you in 1 week to follow up on your stool frequency and pattern.  Hold AMLODIPINE for now, reassess BP with next visit.     Treatment Goals:  Goals (1 Years of Data) as of 10/02/2012          As of Today 09/20/12 09/20/12 09/19/12 09/19/12     Blood Pressure    . Blood Pressure < 140/90  136/78 154/75 158/60 162/80 139/63    . Blood Pressure < 140/90  136/78 154/75 158/60 162/80 139/63     Result Component    . HEMOGLOBIN A1C < 7.0          . HEMOGLOBIN A1C < 7.0          . LDL CALC < 100          . LDL CALC < 130            Progress Toward Treatment Goals:  Treatment Goal 10/02/2012  Hemoglobin A1C at goal  Blood pressure at goal    Self Care Goals & Plans:  Self Care Goal 10/02/2012  Manage my medications take my medicines as prescribed  Monitor my health keep track of my weight; bring my glucose meter and log to each visit  Eat healthy foods eat foods that are low in salt; drink diet soda or water instead of juice or soda  Be physically active find an activity I enjoy    Home Blood Glucose Monitoring 10/02/2012  Check my blood sugar 3 times a day  When to check my blood sugar before meals     Care Management & Community Referrals:  Referral 10/02/2012  Referrals made for care management support none needed

## 2012-10-02 NOTE — Progress Notes (Signed)
Subjective:   Patient ID: [name] Rice female   DOB: Oct 13, 1956 56 y.o.   MRN: [id number]  HPI: 56 year old woman with past medical history significant for morbid obesity, type 2 diabetes mellitus, obstructive sleep apnea on CPAP, COPD recently started on 2 L of home oxygen presents to the clinic for a hospital followup.  Acute diverticulitits: She was recently hospitalized through 09/15/2012 to 09/20/2012 for acute sigmoid diverticulitis when she presented with diarrhea and small amount of blood in her stools. Patient was discharged home on Cipro and Flagyl. She states that she just finished her course of Flagyl 2 days ago and took the last dose of ciprofloxacin today. She states having some extra pills from the ED visit prior to hospitalization. She reports that for the last 2 days-frequency of her stools has decreased as opposed to persistent diarrhea that continued for 7- 10 days even after her discharge. She describes her stools as still loose but semi-formed. She states that her lower left quadrant abdominal pain has resolved completely as of today but she was complaining of some pain in her abdomen from the progress that she caught during the transportation via ambulance.  Diagnosis of  bacterial overgrowth was also considered in the differential during her hospitalization because patient reported having episodes of loose stools for last 5-6 months in the setting of her of low vitamin D. levels. Admitting team requested to recheck vitamin D levels( last checked in 2010) and B12 at today's visit.  Urinary retention: Patient was also noticed to have problems with urinary retention during the hospitalization but she reports that she is voiding now without any difficulty. She was told by some therapist during hospitalization that she may have a bladder prolapse and she wanted [place] to examine that today.  She was also started on oxygen therapy during this hospitalization because she desatted to 76s in  working with physical therapist. She states that she feels less fatigued since she has been started on oxygen and is tolerating that well.  DM: For her diabetes she has been taking 60 units of Lantus twice daily along with the sliding scale. She had one episode of hypoglycemia when she started feeling sweaty and her sugars were as low as 75 . This occurred in the week- she was discharged from the hospital and was still having episodes of diarrhea and not able to tolerate her diet but was taking full dose of insulin.   Past Medical History  Diagnosis Date  . Anxiety   . Arthritis   . Asthma   . COPD (chronic obstructive pulmonary disease)   . Depression   . Diabetes mellitus   . GERD (gastroesophageal reflux disease)   . Hypertension   . HLD (hyperlipidemia)   . Dizziness   . History of colonic polyps   . Lumbar pain 2011    per x-ray - Multilevel spondylosis  . Cervical dystonia   . Tremor CCENTRAL NERVOUS SYSTEM  . Torn rotator cuff RT SHOULDER  . Dermatitis   . Diarrhea   . History of kidney stones   . Recurrent boils     "SLUSTER BOILS" ON ABD AND LABIA  . History of seborrheic dermatitis     face,ears  . OSA on CPAP 2008  . Sleep apnea     USES C-PAP   Family History  Problem Relation Age of Onset  . Heart disease Brother   . Stroke Neg Hx   . Cancer Neg Hx    History  Social History  . Marital Status: Divorced    Spouse Name: N/A    Number of Children: N/A  . Years of Education: N/A   Occupational History  . Not on file.   Social History Main Topics  . Smoking status: Former Games developer  . Smokeless tobacco: Former Neurosurgeon    Quit date: 07/08/1997  . Alcohol Use: No  . Drug Use: No  . Sexually Active: Not Currently   Other Topics Concern  . Not on file   Social History Narrative   Lives alone in [place].    Review of Systems: General: Denies fever, chills, diaphoresis, appetite change and fatigue. HEENT: Denies photophobia, eye pain, redness, hearing loss,  ear pain, congestion, sore throat, rhinorrhea, sneezing, mouth sores, trouble swallowing, neck pain, neck stiffness and tinnitus. Respiratory: Denies SOB, DOE, cough, chest tightness, and wheezing. Cardiovascular: Denies to chest pain, palpitations and leg swelling. Gastrointestinal: Denies nausea, vomiting, abdominal pain, diarrhea, constipation, blood in stool and abdominal distention. Genitourinary: Denies dysuria, urgency, frequency, hematuria, flank pain and difficulty urinating. Musculoskeletal: Denies myalgias, back pain, joint swelling, arthralgias and gait problem.  Skin: Denies pallor, rash and wound. Neurological: Denies dizziness, seizures, syncope, weakness, light-headedness, numbness and headaches. Hematological: Denies adenopathy, easy bruising, personal or family bleeding history. Psychiatric/Behavioral: Denies suicidal ideation, mood changes, confusion, nervousness, sleep disturbance and agitation.    Current Outpatient Medications: Current Outpatient Prescriptions  Medication Sig Dispense Refill  . ADVAIR DISKUS 250-50 MCG/DOSE AEPB Inhale 1 puff into the lungs 2 (two) times daily.      Marland Kitchen albuterol (VENTOLIN HFA) 108 (90 BASE) MCG/ACT inhaler Inhale 2 puffs into the lungs every 6 (six) hours as needed. Wheezing       . allopurinol (ZYLOPRIM) 300 MG tablet TAKE 1 TABLET BY MOUTH DAILY  30 tablet  3  . B-D INS SYR ULTRAFINE 1CC/30G 30G X 1/2" 1 ML MISC USE AS DIRECTED WITH EVERY MEAL AND AT BEDTIME  100 each  2  . ciprofloxacin (CIPRO) 500 MG tablet Take 1 tablet (500 mg total) by mouth 2 (two) times daily.  8 tablet  0  . CYMBALTA 20 MG capsule TAKE 1 CAPSULE BY MOUTH DAILY  30 capsule  0  . CYMBALTA 60 MG capsule TAKE 1 CAPSULE BY MOUTH DAILY  30 capsule  0  . DULoxetine (CYMBALTA) 20 MG capsule Take 20 mg by mouth daily. With 60mg  capsule = 80mg       . DULoxetine (CYMBALTA) 60 MG capsule Take 60 mg by mouth daily. With 20mg  capsule = 80mg       . ferrous sulfate 325 (65  FE) MG tablet TAKE 1 TABLET BY MOUTH DAILY  30 tablet  0  . fluconazole (DIFLUCAN) 150 MG tablet Take 1 tab by mouth today and repeat it in 72 hours if needed.  2 tablet  0  . furosemide (LASIX) 40 MG tablet TAKE 1 TABLET BY MOUTH DAILY  90 tablet  0  . gabapentin (NEURONTIN) 300 MG capsule Take 1 capsule (300 mg total) by mouth 3 (three) times daily.  90 capsule  3  . glucose blood (ONE TOUCH ULTRA TEST) test strip 1 each by Other route 3 (three) times daily before meals. Use as instructed  100 each  12  . HYDROcodone-acetaminophen (NORCO) 10-325 MG per tablet Take 1-2 tablets by mouth every 4 (four) hours as needed.  30 tablet  0  . HYDROcodone-acetaminophen (NORCO) 10-325 MG per tablet Take 1 tablet by mouth Once daily.      Marland Kitchen  ibuprofen (ADVIL) 200 MG tablet Take 1 tablet (200 mg total) by mouth every 8 (eight) hours as needed for pain.  30 tablet  2  . insulin aspart (NOVOLOG) 100 UNIT/ML injection Inject 60 Units into the skin 3 (three) times daily before meals.       . insulin glargine (LANTUS) 100 UNIT/ML injection Inject 70 Units into the skin 2 (two) times daily.       . metFORMIN (GLUCOPHAGE) 1000 MG tablet Take 1 tablet (1,000 mg total) by mouth 2 (two) times daily with a meal.  180 tablet  3  . methocarbamol (ROBAXIN) 500 MG tablet Take 500 mg by mouth 2 (two) times daily.       . metroNIDAZOLE (FLAGYL) 500 MG tablet Take 1 tablet (500 mg total) by mouth every 8 (eight) hours.  12 tablet  0  . montelukast (SINGULAIR) 10 MG tablet Take 1 tablet (10 mg total) by mouth at bedtime.  96 tablet  2  . NASONEX 50 MCG/ACT nasal spray USE 2 SPRAYS IN EACH NOSTRIL THE DAILY  1 Inhaler  3  . olopatadine (PATANOL) 0.1 % ophthalmic solution Place 1 drop into both eyes 2 (two) times daily as needed. Itching eyes  5 mL  2  . ondansetron (ZOFRAN) 4 MG tablet Take 1 tablet (4 mg total) by mouth every 6 (six) hours.  12 tablet  0  . ONETOUCH DELICA LANCETS FINE MISC 1 each by Does not apply route 4 (four)  times daily.  200 each  1  . pantoprazole (PROTONIX) 40 MG tablet Take 1 tablet (40 mg total) by mouth daily.  90 tablet  3  . pravastatin (PRAVACHOL) 40 MG tablet Take 1 tablet (40 mg total) by mouth daily.  90 tablet  3  . propranolol (INDERAL) 20 MG tablet Take 1 tablet (20 mg total) by mouth 2 (two) times daily.  60 tablet  11  . selenium sulfide (SELSUN BLUE DAILY) 1 % LOTN Apply 1 application topically once a week. For face      . Syringe/Needle, Disp, (B-D ECLIPSE SYRINGE) 30G X 1/2" 1 ML MISC 1 Syringe by Does not apply route 3 (three) times daily.        Marland Kitchen zolpidem (AMBIEN) 5 MG tablet TAKE ONE TABLET BY MOUTH AT BEDTIME AS NEEDED FOR SLEEP  30 tablet  0    Allergies: Allergies  Allergen Reactions  . Ace Inhibitors     Causes blood pressure to drop rapidly  . Cefaclor Hives    REACTION: hives  . Shrimp (Shellfish Allergy) Nausea And Vomiting  . Sulfonamide Derivatives Rash    REACTION: rash      Objective:   Physical Exam: Filed Vitals:   10/02/12 1454  BP: 136/78  Pulse: 100  Temp: 97.2 F (36.2 C)    General: Vital signs reviewed and noted. Well-developed, well-nourished, in no acute distress; morbidly obsese, alert, appropriate and cooperative throughout examination. Walks with walker Head: Normocephalic, atraumatic Lungs: Normal respiratory effort. Clear to auscultation BL without crackles or wheezes. Heart: RRR. S1 and S2 normal without gallop, murmur, or rubs. Abdomen:BS normoactive. Soft, Nondistended, non-tender.  No masses or organomegaly. Circular bruise about 6 cm in diameter present in the left paraumbilical area with some underlying skin thickening. Genitourinary: No bladder prolapse noticed, thick whitish vaginal discharge noted on pelvic exam likely consistent with candidiasis. Extremities: No pretibial edema.     Assessment & Plan:

## 2012-10-03 ENCOUNTER — Telehealth: Payer: Self-pay | Admitting: Internal Medicine

## 2012-10-03 LAB — COMPLETE METABOLIC PANEL WITH GFR
ALT: 22 U/L (ref 0–35)
Alkaline Phosphatase: 75 U/L (ref 39–117)
Calcium: 8.9 mg/dL (ref 8.4–10.5)
Creat: 0.97 mg/dL (ref 0.50–1.10)
GFR, Est Non African American: 65 mL/min
Sodium: 141 mEq/L (ref 135–145)
Total Bilirubin: 0.4 mg/dL (ref 0.3–1.2)
Total Protein: 6.9 g/dL (ref 6.0–8.3)

## 2012-10-03 MED ORDER — CHOLECALCIFEROL 1.25 MG (50000 UT) PO CAPS: 50000.0000 [IU] | ORAL_CAPSULE | ORAL | Status: DC

## 2012-10-03 NOTE — Assessment & Plan Note (Signed)
Patient reports feeling much better for last 2-3 days to decrease in stool frequency and complete resolution of left lower quadrant abdominal pain. She still has loose semi-formed stools but not watery diarrhea. Denies noticing any blood in her stools. She finished her course of ciprofloxacin today and Flagyl about 2 days ago. Bacterial overgrowth was also in the differential during hospitalization with duration of symptoms (  Several episodes of urgent , post prandial watery diarrhea)  for last 5-6 months with low vitamin D and ferritin levels favoring malabsorption. -Check B12 levels and vitamin D levels. -Call in one week to followup on her diarrhea and abdominal pain. If her symptoms gets worse would consider repeating a small course of Cipro and Flagyl. -Schedule a followup appointment in 1 month.

## 2012-10-03 NOTE — Telephone Encounter (Signed)
Patient was noted to have low Vit - D levels. She was informed about the results and her prescription for vit D was electronically sent to the pharmacy.   Thanks, [employer]

## 2012-10-03 NOTE — Assessment & Plan Note (Signed)
Patient was started on amlodipine because of moderately elevated blood pressure with systolics in the 150s to 160s. Patient takes propranolol for her blood pressure that inturn helps with her essential tremors. She states that she was running out of her propranolol since she was discharged from the hospital and was just taking amlodipine. Her blood pressure today is 136/78. I think her elevation in blood pressure during hospitalization was likely secondary to her acute distress from the episode of acute diverticulitis. I'm hesitant to continue her on 2 blood pressure medicines. -Hold amlodipine for now. Reassess blood pressure in one month. -Continue propranolol at current dose for now.

## 2012-10-03 NOTE — Assessment & Plan Note (Signed)
She got a Pap smear today. She was noticed to have thick whitish discharge on pelvic exam consistent with vulvovaginal candida gases likely in the setting of recent antibiotic course for her sigmoid diverticulitis and possible bacterial overgrowth. She was also given prescription for 2 tablets of fluconazole with the instructions to take one tablet  today and repeat again in 3 days if needed. Patient verbalized understanding. She has not scheduled her yearly mammogram yet because of the location of her abdominal wall lipoma just underneath breast . Though it was removed about 2 months ago but she still has  some residual pain just underneath her breast. She states that she would consider getting that done in few months.

## 2012-10-03 NOTE — Assessment & Plan Note (Signed)
Her CBGs are well controlled with mean of 208. Patient was noted to have couple of hypoglycemic events post discharge when she was still having episodes of diarrhea but was taking 70 units of insulin twice daily.  She states that she cut down her insulin to 60 units twice a day and since then has not had any further episodes of hypoglycemia.  I feel confident that [name] Rice would be able to adjust her insulin based on her CBG readings. Encouraged her to keep doing the good job.and congratulated her for keeping her DM under control. Counseled her on weight reduction that would help in reducing the dose of her insulin. -Continue 60 units of Lantus twice a day with sliding scale.

## 2012-10-04 ENCOUNTER — Telehealth: Payer: Self-pay | Admitting: *Deleted

## 2012-10-04 DIAGNOSIS — E559 Vitamin D deficiency, unspecified: Secondary | ICD-10-CM

## 2012-10-04 MED ORDER — VITAMIN D (ERGOCALCIFEROL) 1.25 MG (50000 UNIT) PO CAPS: 50000.0000 [IU] | ORAL_CAPSULE | ORAL | Status: DC

## 2012-10-04 NOTE — Telephone Encounter (Signed)
Pharmacy calls and states they only carry D2 ergo calciferol, also they one originally ordered is expensive, can you change it to ergo calciferol and send electronically?

## 2012-10-04 NOTE — Telephone Encounter (Signed)
I changed it to ergocalciferol and the prescription was electronically sent to the pharmacy.  Thanks, [employer]

## 2012-10-05 ENCOUNTER — Other Ambulatory Visit: Payer: Self-pay | Admitting: *Deleted

## 2012-10-05 DIAGNOSIS — K6389 Other specified diseases of intestine: Secondary | ICD-10-CM

## 2012-10-05 NOTE — Telephone Encounter (Signed)
Pt having pain/diarrhea s/p diverticulitis.  Request antibiotic. She stated you asked her to call in if she was in need of this over the weekend. I put in for both, not sure what you want to use.

## 2012-10-08 ENCOUNTER — Encounter: Payer: Self-pay | Admitting: Internal Medicine

## 2012-10-08 ENCOUNTER — Ambulatory Visit (INDEPENDENT_AMBULATORY_CARE_PROVIDER_SITE_OTHER): Payer: Medicare Other | Admitting: Internal Medicine

## 2012-10-08 VITALS — BP 136/64 | HR 70 | Temp 98.7°F | Ht 63.0 in | Wt 267.6 lb

## 2012-10-08 DIAGNOSIS — E119 Type 2 diabetes mellitus without complications: Secondary | ICD-10-CM

## 2012-10-08 DIAGNOSIS — R197 Diarrhea, unspecified: Secondary | ICD-10-CM | POA: Diagnosis not present

## 2012-10-08 DIAGNOSIS — K5732 Diverticulitis of large intestine without perforation or abscess without bleeding: Secondary | ICD-10-CM

## 2012-10-08 DIAGNOSIS — K5792 Diverticulitis of intestine, part unspecified, without perforation or abscess without bleeding: Secondary | ICD-10-CM

## 2012-10-08 LAB — CLOSTRIDIUM DIFFICILE BY PCR: Toxigenic C. Difficile by PCR: NEGATIVE

## 2012-10-08 MED ORDER — CIPROFLOXACIN HCL 500 MG PO TABS: 500.0000 mg | ORAL_TABLET | Freq: Two times a day (BID) | ORAL | Status: DC

## 2012-10-08 MED ORDER — METRONIDAZOLE 500 MG PO TABS: 500.0000 mg | ORAL_TABLET | Freq: Three times a day (TID) | ORAL | Status: DC

## 2012-10-08 MED ORDER — HYDROCODONE-ACETAMINOPHEN 5-325 MG PO TABS: 1.0000 | ORAL_TABLET | Freq: Four times a day (QID) | ORAL | Status: DC | PRN

## 2012-10-08 NOTE — Progress Notes (Deleted)
Subjective:   Patient ID: [name] Rice female   DOB: [birth year] 56 y.o.   MRN: [id number]  HPI:   Past Medical History  Diagnosis Date  . Anxiety   . Arthritis   . Asthma   . COPD (chronic obstructive pulmonary disease)   . Depression   . Diabetes mellitus   . GERD (gastroesophageal reflux disease)   . Hypertension   . HLD (hyperlipidemia)   . Dizziness   . History of colonic polyps   . Lumbar pain 2011    per x-ray - Multilevel spondylosis  . Cervical dystonia   . Tremor CCENTRAL NERVOUS SYSTEM  . Torn rotator cuff RT SHOULDER  . Dermatitis   . Diarrhea   . History of kidney stones   . Recurrent boils     "SLUSTER BOILS" ON ABD AND LABIA  . History of seborrheic dermatitis     face,ears  . OSA on CPAP 2008  . Sleep apnea     USES C-PAP   Family History  Problem Relation Age of Onset  . Heart disease Brother   . Stroke Neg Hx   . Cancer Neg Hx    History   Social History  . Marital Status: Divorced    Spouse Name: N/A    Number of Children: N/A  . Years of Education: N/A   Occupational History  . Not on file.   Social History Main Topics  . Smoking status: Former Games developer  . Smokeless tobacco: Former Neurosurgeon    Quit date: 07/08/1997  . Alcohol Use: No  . Drug Use: No  . Sexually Active: Not Currently   Other Topics Concern  . Not on file   Social History Narrative   Lives alone in [place].    Review of Systems: General: Denies fever, chills, diaphoresis, appetite change and fatigue. HEENT: Denies photophobia, eye pain, redness, hearing loss, ear pain, congestion, sore throat, rhinorrhea, sneezing, mouth sores, trouble swallowing, neck pain, neck stiffness and tinnitus. Respiratory: Denies SOB, DOE, cough, chest tightness, and wheezing. Cardiovascular: Denies to chest pain, palpitations and leg swelling. Gastrointestinal: Denies nausea, vomiting, abdominal pain,  constipation, blood in stool and abdominal distention, + diarrhea( loose stools - frequency of  two times a day). Genitourinary: Denies dysuria, urgency, frequency, hematuria, flank pain and difficulty urinating. Musculoskeletal: Denies myalgias, back pain, joint swelling, arthralgias and gait problem.  Skin: Denies pallor, rash and wound. Neurological: Denies dizziness, seizures, syncope, weakness, light-headedness, numbness and headaches. Hematological: Denies adenopathy, easy bruising, personal or family bleeding history. Psychiatric/Behavioral: Denies suicidal ideation, mood changes, confusion, nervousness, sleep disturbance and agitation.    Current Outpatient Medications: Current Outpatient Prescriptions  Medication Sig Dispense Refill  . ADVAIR DISKUS 250-50 MCG/DOSE AEPB Inhale 1 puff into the lungs 2 (two) times daily.      Marland Kitchen albuterol (VENTOLIN HFA) 108 (90 BASE) MCG/ACT inhaler Inhale 2 puffs into the lungs every 6 (six) hours as needed. Wheezing       . allopurinol (ZYLOPRIM) 300 MG tablet TAKE 1 TABLET BY MOUTH DAILY  30 tablet  3  . B-D INS SYR ULTRAFINE 1CC/30G 30G X 1/2" 1 ML MISC USE AS DIRECTED WITH EVERY MEAL AND AT BEDTIME  100 each  2  . ciprofloxacin (CIPRO) 500 MG tablet Take 1 tablet (500 mg total) by mouth 2 (two) times daily.  8 tablet  0  . CYMBALTA 20 MG capsule TAKE 1 CAPSULE BY MOUTH DAILY  30 capsule  0  . CYMBALTA 60 MG  capsule TAKE 1 CAPSULE BY MOUTH DAILY  30 capsule  0  . DULoxetine (CYMBALTA) 20 MG capsule Take 20 mg by mouth daily. With 60mg  capsule = 80mg       . DULoxetine (CYMBALTA) 60 MG capsule Take 60 mg by mouth daily. With 20mg  capsule = 80mg       . ferrous sulfate 325 (65 FE) MG tablet TAKE 1 TABLET BY MOUTH DAILY  30 tablet  0  . fluconazole (DIFLUCAN) 150 MG tablet Take 1 tab by mouth today and repeat it in 72 hours if needed.  2 tablet  0  . furosemide (LASIX) 40 MG tablet TAKE 1 TABLET BY MOUTH DAILY  90 tablet  0  . gabapentin (NEURONTIN) 300 MG capsule Take 1 capsule (300 mg total) by mouth 3 (three) times daily.  90 capsule  3  .  glucose blood (ONE TOUCH ULTRA TEST) test strip 1 each by Other route 3 (three) times daily before meals. Use as instructed  100 each  12  . HYDROcodone-acetaminophen (NORCO) 10-325 MG per tablet Take 1-2 tablets by mouth every 4 (four) hours as needed.  30 tablet  0  . HYDROcodone-acetaminophen (NORCO) 10-325 MG per tablet Take 1 tablet by mouth Once daily.      Marland Kitchen ibuprofen (ADVIL) 200 MG tablet Take 1 tablet (200 mg total) by mouth every 8 (eight) hours as needed for pain.  30 tablet  2  . insulin aspart (NOVOLOG) 100 UNIT/ML injection Inject 60 Units into the skin 3 (three) times daily before meals.       . insulin glargine (LANTUS) 100 UNIT/ML injection Inject 60 Units into the skin 2 (two) times daily.       . metFORMIN (GLUCOPHAGE) 1000 MG tablet Take 1 tablet (1,000 mg total) by mouth 2 (two) times daily with a meal.  180 tablet  3  . methocarbamol (ROBAXIN) 500 MG tablet Take 500 mg by mouth 2 (two) times daily.       . metroNIDAZOLE (FLAGYL) 500 MG tablet Take 1 tablet (500 mg total) by mouth every 8 (eight) hours.  12 tablet  0  . montelukast (SINGULAIR) 10 MG tablet Take 1 tablet (10 mg total) by mouth at bedtime.  96 tablet  2  . NASONEX 50 MCG/ACT nasal spray USE 2 SPRAYS IN EACH NOSTRIL THE DAILY  1 Inhaler  3  . olopatadine (PATANOL) 0.1 % ophthalmic solution Place 1 drop into both eyes 2 (two) times daily as needed. Itching eyes  5 mL  2  . ondansetron (ZOFRAN) 4 MG tablet Take 1 tablet (4 mg total) by mouth every 6 (six) hours.  12 tablet  0  . ONETOUCH DELICA LANCETS FINE MISC 1 each by Does not apply route 4 (four) times daily.  200 each  1  . pantoprazole (PROTONIX) 40 MG tablet Take 1 tablet (40 mg total) by mouth daily.  90 tablet  3  . pravastatin (PRAVACHOL) 40 MG tablet Take 1 tablet (40 mg total) by mouth daily.  90 tablet  3  . propranolol (INDERAL) 20 MG tablet Take 1 tablet (20 mg total) by mouth 2 (two) times daily.  60 tablet  11  . selenium sulfide (SELSUN BLUE DAILY)  1 % LOTN Apply 1 application topically once a week. For face      . Syringe/Needle, Disp, (B-D ECLIPSE SYRINGE) 30G X 1/2" 1 ML MISC 1 Syringe by Does not apply route 3 (three) times daily.        Marland Kitchen  Vitamin D, Ergocalciferol, (DRISDOL) 50000 UNITS CAPS Take 1 capsule (50,000 Units total) by mouth every 7 (seven) days.  10 capsule  0  . zolpidem (AMBIEN) 5 MG tablet TAKE ONE TABLET BY MOUTH AT BEDTIME AS NEEDED FOR SLEEP  30 tablet  0    Allergies: Allergies  Allergen Reactions  . Ace Inhibitors     Causes blood pressure to drop rapidly  . Cefaclor Hives    REACTION: hives  . Shrimp (Shellfish Allergy) Nausea And Vomiting  . Sulfonamide Derivatives Rash    REACTION: rash      Objective:   Physical Exam: Filed Vitals:   10/08/12 1108  BP: 136/78  Pulse: 94  Temp: 98.7 F (37.1 C)    General: Vital signs reviewed and noted. Well-developed, well-nourished, in no acute distress; alert, appropriate and cooperative throughout examination. Head: Normocephalic, atraumatic Lungs: Normal respiratory effort. Clear to auscultation BL without crackles or wheezes. Heart: RRR. S1 and S2 normal without gallop, murmur, or rubs. Abdomen:BS normoactive. Soft, Nondistended, non-tender.  No masses or organomegaly. Extremities: No pretibial edema.     Assessment & Plan:

## 2012-10-08 NOTE — Telephone Encounter (Signed)
Pt schedule for today.

## 2012-10-08 NOTE — Telephone Encounter (Signed)
Patient states that she is not feeling good- she is continuing to have loose stools whenever she goes to the bathroom. She states that she is having stools less often but she does not have appetite. She is drinking fluids to keep herself hydrated but doe snot feel good.  I can give her another course of antibiotics but C. Diff is also a possibility with her finishing the course of antibiotics which although is low on differential with her also taking flagyl.  Plan; Advised her to come to the clinic for revaluation given the holiday schedule.If possible, would check her C. Diff Would consider repeating the course of cipro and flagyl .

## 2012-10-08 NOTE — Progress Notes (Signed)
Subjective:   Patient ID: [name] Rice female   DOB: [birth year] 56 y.o.   MRN: [id number]  HPI: 56 year old woman with past medical history significant for morbid obesity, recent hospitalization for acute sigmoid diverticulitis , probable intestinal bowel bacterial overgrowth syndrome comes to the clinic for a followup visit.  Patient was seen in the clinic last week as a hospital followup for her acute diverticulitis. Patient states that she was feeling fine on the day of her clinic visit but the very next day she started to have return of diarrhea. She was not able to get out of bed for one whole day. Every time now when she eats- she has to go to the bathroom,.She is little better today. She called our clinic over the weekend and with the follow up return call today- she decided to come for an evaluation in the clinic today because of the holidays coming up. She is moving her bowels about 2 times a day and is not having much appetite. Her stools are loose, mucoid but denies noticing any blood. She is also burping a lot. She reports having pain in her left side of the abdomen at the place where she got a bruise while transportation in the ambulance but denies any left lower quadrant pain. She clearly states that her pain was much lower in her belly and more intense when she was admitted for acute sigmoid diverticulitis which is not the case today. She has been trying to eat soft diet like chicken noodle soup and jellies but had to move her bowel even after that. She denies feeling lightheaded but states feeling her room spinning when she lies down thinking of that her BPPV is getting worse.    Past Medical History  Diagnosis Date  . Anxiety   . Arthritis   . Asthma   . COPD (chronic obstructive pulmonary disease)   . Depression   . Diabetes mellitus   . GERD (gastroesophageal reflux disease)   . Hypertension   . HLD (hyperlipidemia)   . Dizziness   . History of colonic polyps   . Lumbar pain  2011    per x-ray - Multilevel spondylosis  . Cervical dystonia   . Tremor CCENTRAL NERVOUS SYSTEM  . Torn rotator cuff RT SHOULDER  . Dermatitis   . Diarrhea   . History of kidney stones   . Recurrent boils     "SLUSTER BOILS" ON ABD AND LABIA  . History of seborrheic dermatitis     face,ears  . OSA on CPAP 2008  . Sleep apnea     USES C-PAP   Family History  Problem Relation Age of Onset  . Heart disease Brother   . Stroke Neg Hx   . Cancer Neg Hx    History   Social History  . Marital Status: Divorced    Spouse Name: N/A    Number of Children: N/A  . Years of Education: N/A   Occupational History  . Not on file.   Social History Main Topics  . Smoking status: Former Games developer  . Smokeless tobacco: Former Neurosurgeon    Quit date: 07/08/1997  . Alcohol Use: No  . Drug Use: No  . Sexually Active: Not Currently   Other Topics Concern  . Not on file   Social History Narrative   Lives alone in [place].    Review of Systems: General: Denies fever, chills, diaphoresis, appetite change and fatigue. HEENT: Denies photophobia, eye pain, redness, hearing loss, ear  pain, congestion, sore throat, rhinorrhea, sneezing, mouth sores, trouble swallowing, neck pain, neck stiffness and tinnitus. Respiratory: Denies SOB, DOE, cough, chest tightness, and wheezing. Cardiovascular: Denies to chest pain, palpitations and leg swelling. Gastrointestinal: Denies nausea, vomiting, + abdominal pain( at the site of her bruise via ambulance transportation), + diarrhea, constipation, blood in stool and abdominal distention. Genitourinary: Denies dysuria, urgency, frequency, hematuria, flank pain and difficulty urinating. Musculoskeletal: Denies myalgias, back pain, joint swelling, arthralgias and gait problem.  Skin: Denies pallor, rash and wound. Neurological: Denies dizziness, seizures, syncope, weakness, light-headedness, numbness and headaches. Hematological: Denies adenopathy, easy bruising,  personal or family bleeding history. Psychiatric/Behavioral: Denies suicidal ideation, mood changes, confusion, nervousness, sleep disturbance and agitation.    Current Outpatient Medications: Current Outpatient Prescriptions  Medication Sig Dispense Refill  . ADVAIR DISKUS 250-50 MCG/DOSE AEPB Inhale 1 puff into the lungs 2 (two) times daily.      Marland Kitchen albuterol (VENTOLIN HFA) 108 (90 BASE) MCG/ACT inhaler Inhale 2 puffs into the lungs every 6 (six) hours as needed. Wheezing       . allopurinol (ZYLOPRIM) 300 MG tablet TAKE 1 TABLET BY MOUTH DAILY  30 tablet  3  . B-D INS SYR ULTRAFINE 1CC/30G 30G X 1/2" 1 ML MISC USE AS DIRECTED WITH EVERY MEAL AND AT BEDTIME  100 each  2  . ciprofloxacin (CIPRO) 500 MG tablet Take 1 tablet (500 mg total) by mouth 2 (two) times daily.  8 tablet  0  . CYMBALTA 20 MG capsule TAKE 1 CAPSULE BY MOUTH DAILY  30 capsule  0  . CYMBALTA 60 MG capsule TAKE 1 CAPSULE BY MOUTH DAILY  30 capsule  0  . DULoxetine (CYMBALTA) 20 MG capsule Take 20 mg by mouth daily. With 60mg  capsule = 80mg       . DULoxetine (CYMBALTA) 60 MG capsule Take 60 mg by mouth daily. With 20mg  capsule = 80mg       . ferrous sulfate 325 (65 FE) MG tablet TAKE 1 TABLET BY MOUTH DAILY  30 tablet  0  . fluconazole (DIFLUCAN) 150 MG tablet Take 1 tab by mouth today and repeat it in 72 hours if needed.  2 tablet  0  . furosemide (LASIX) 40 MG tablet TAKE 1 TABLET BY MOUTH DAILY  90 tablet  0  . gabapentin (NEURONTIN) 300 MG capsule Take 1 capsule (300 mg total) by mouth 3 (three) times daily.  90 capsule  3  . glucose blood (ONE TOUCH ULTRA TEST) test strip 1 each by Other route 3 (three) times daily before meals. Use as instructed  100 each  12  . HYDROcodone-acetaminophen (NORCO) 10-325 MG per tablet Take 1-2 tablets by mouth every 4 (four) hours as needed.  30 tablet  0  . HYDROcodone-acetaminophen (NORCO) 10-325 MG per tablet Take 1 tablet by mouth Once daily.      Marland Kitchen ibuprofen (ADVIL) 200 MG tablet  Take 1 tablet (200 mg total) by mouth every 8 (eight) hours as needed for pain.  30 tablet  2  . insulin aspart (NOVOLOG) 100 UNIT/ML injection Inject 60 Units into the skin 3 (three) times daily before meals.       . insulin glargine (LANTUS) 100 UNIT/ML injection Inject 60 Units into the skin 2 (two) times daily.       . metFORMIN (GLUCOPHAGE) 1000 MG tablet Take 1 tablet (1,000 mg total) by mouth 2 (two) times daily with a meal.  180 tablet  3  . methocarbamol (ROBAXIN) 500  MG tablet Take 500 mg by mouth 2 (two) times daily.       . metroNIDAZOLE (FLAGYL) 500 MG tablet Take 1 tablet (500 mg total) by mouth every 8 (eight) hours.  12 tablet  0  . montelukast (SINGULAIR) 10 MG tablet Take 1 tablet (10 mg total) by mouth at bedtime.  96 tablet  2  . NASONEX 50 MCG/ACT nasal spray USE 2 SPRAYS IN EACH NOSTRIL THE DAILY  1 Inhaler  3  . olopatadine (PATANOL) 0.1 % ophthalmic solution Place 1 drop into both eyes 2 (two) times daily as needed. Itching eyes  5 mL  2  . ondansetron (ZOFRAN) 4 MG tablet Take 1 tablet (4 mg total) by mouth every 6 (six) hours.  12 tablet  0  . ONETOUCH DELICA LANCETS FINE MISC 1 each by Does not apply route 4 (four) times daily.  200 each  1  . pantoprazole (PROTONIX) 40 MG tablet Take 1 tablet (40 mg total) by mouth daily.  90 tablet  3  . pravastatin (PRAVACHOL) 40 MG tablet Take 1 tablet (40 mg total) by mouth daily.  90 tablet  3  . propranolol (INDERAL) 20 MG tablet Take 1 tablet (20 mg total) by mouth 2 (two) times daily.  60 tablet  11  . selenium sulfide (SELSUN BLUE DAILY) 1 % LOTN Apply 1 application topically once a week. For face      . Syringe/Needle, Disp, (B-D ECLIPSE SYRINGE) 30G X 1/2" 1 ML MISC 1 Syringe by Does not apply route 3 (three) times daily.        . Vitamin D, Ergocalciferol, (DRISDOL) 50000 UNITS CAPS Take 1 capsule (50,000 Units total) by mouth every 7 (seven) days.  10 capsule  0  . zolpidem (AMBIEN) 5 MG tablet TAKE ONE TABLET BY MOUTH AT  BEDTIME AS NEEDED FOR SLEEP  30 tablet  0    Allergies: Allergies  Allergen Reactions  . Ace Inhibitors     Causes blood pressure to drop rapidly  . Cefaclor Hives    REACTION: hives  . Shrimp (Shellfish Allergy) Nausea And Vomiting  . Sulfonamide Derivatives Rash    REACTION: rash      Objective:   Physical Exam: Filed Vitals:   10/08/12 1108  BP: 136/78  Pulse: 94  Temp: 98.7 F (37.1 C)    General: Vital signs reviewed and noted. Well-developed, well-nourished, in no acute distress; alert, appropriate and cooperative throughout examination. Head: Normocephalic, atraumatic Lungs: Normal respiratory effort. Clear to auscultation BL without crackles or wheezes. Heart: RRR. S1 and S2 normal without gallop, murmur, or rubs. Abdomen:BS normoactive. Soft, Nondistended, non-tender.  No masses or organomegaly. Her bruise id present in the left paraumbilical region which is lighter in color form her previous visit but still tender with ? Cyst developing in the area. Extremities: No pretibial edema.     Assessment & Plan:

## 2012-10-08 NOTE — Assessment & Plan Note (Addendum)
Patient presents to the clinic because she had  worsening of her diarrhea from the last clinic visit.She states that she is moving her bowels- twice a day currently but her stools are still loose and mucoid. She is taking soft diet but still has persistent symptoms. Denies LLQ pain per se. DD include persistent diverticulitis vs bacterial overgrowth vs C. Diff. I think her symptoms could be likely continuum of diverticulitis vs bacterial overgrowth that was thought to be high in differential during hospitalization with concomitant low Vit D levels and ferritin. Orthostatic vitals were checked and were normal.  - Check for C.diff - Repeat short course of cipro and flagyl for 5 days. - Patient was also given a prescription of vicodin for her bruise related abdominal pain and back pain( she has pain contract signed with the clinic for #30 tabs of vicodin. This might be an early refill but I do not see any red flags associated with her)  Update: Her C. Diff by PCR was negative. Would treat her with short course of antibiotics. Patient was called and informed about her test results.  Would call her in 1 week to follow up on her symptoms

## 2012-10-12 LAB — STOOL CULTURE

## 2012-10-29 ENCOUNTER — Encounter: Payer: Medicare Other | Admitting: Internal Medicine

## 2012-11-05 ENCOUNTER — Other Ambulatory Visit: Payer: Self-pay | Admitting: Internal Medicine

## 2012-11-06 ENCOUNTER — Other Ambulatory Visit: Payer: Self-pay | Admitting: Internal Medicine

## 2012-11-06 NOTE — Telephone Encounter (Signed)
Hydrocodone 5/325mg  rx called to [facility] pharmacy.

## 2012-11-06 NOTE — Telephone Encounter (Signed)
Pain Contract signed in clinic is for # 30 pills.  It appears from review of chart that the one time increase to # 45 pills was for an acute reason (abdominal pain associated with diverticulitis.)  Since Dr. Dorthula Rue is out of the [place], will refill pain contract amount of #30 pills, as there have been no red flags reported by Dr. Dorthula Rue (December 2013 note).  If patient desires permanent increase in # of pills, she will need to discuss with Dr. Dorthula Rue at next visit and sign a new contract.   EBM

## 2012-11-06 NOTE — Telephone Encounter (Signed)
Patient with normal H/H, MCV still a little low.  Has been on iron supplementation at least since June (last iron panel).  Could probably benefit from re-evaluation of pica symptoms and repeat iron panel to assess for need for continued therapy.    EBM

## 2012-11-07 ENCOUNTER — Other Ambulatory Visit: Payer: Self-pay | Admitting: *Deleted

## 2012-11-08 ENCOUNTER — Other Ambulatory Visit: Payer: Self-pay | Admitting: Internal Medicine

## 2012-11-08 NOTE — Telephone Encounter (Signed)
Meds refilled in another encounter.

## 2012-11-24 ENCOUNTER — Other Ambulatory Visit: Payer: Self-pay | Admitting: Internal Medicine

## 2012-11-24 DIAGNOSIS — G4700 Insomnia, unspecified: Secondary | ICD-10-CM

## 2012-11-24 DIAGNOSIS — E119 Type 2 diabetes mellitus without complications: Secondary | ICD-10-CM

## 2012-11-28 ENCOUNTER — Other Ambulatory Visit: Payer: Self-pay | Admitting: Internal Medicine

## 2012-11-28 NOTE — Telephone Encounter (Signed)
Rx for generic Ambien called in to pharmacy. Stanton Kidney Vikrant Pryce RN 11/28/12 1:58PM

## 2012-11-28 NOTE — Telephone Encounter (Signed)
Called to pharm 

## 2012-12-04 ENCOUNTER — Ambulatory Visit (INDEPENDENT_AMBULATORY_CARE_PROVIDER_SITE_OTHER): Payer: Medicare Other | Admitting: Internal Medicine

## 2012-12-04 ENCOUNTER — Encounter: Payer: Self-pay | Admitting: Internal Medicine

## 2012-12-04 VITALS — BP 129/68 | HR 74 | Temp 97.0°F | Ht 63.0 in | Wt 267.4 lb

## 2012-12-04 DIAGNOSIS — IMO0002 Reserved for concepts with insufficient information to code with codable children: Secondary | ICD-10-CM

## 2012-12-04 DIAGNOSIS — F3289 Other specified depressive episodes: Secondary | ICD-10-CM | POA: Diagnosis not present

## 2012-12-04 DIAGNOSIS — G4733 Obstructive sleep apnea (adult) (pediatric): Secondary | ICD-10-CM

## 2012-12-04 DIAGNOSIS — H8110 Benign paroxysmal vertigo, unspecified ear: Secondary | ICD-10-CM

## 2012-12-04 DIAGNOSIS — J441 Chronic obstructive pulmonary disease with (acute) exacerbation: Secondary | ICD-10-CM | POA: Diagnosis not present

## 2012-12-04 DIAGNOSIS — F418 Other specified anxiety disorders: Secondary | ICD-10-CM

## 2012-12-04 DIAGNOSIS — Z0000 Encounter for general adult medical examination without abnormal findings: Secondary | ICD-10-CM | POA: Diagnosis not present

## 2012-12-04 DIAGNOSIS — F341 Dysthymic disorder: Secondary | ICD-10-CM

## 2012-12-04 DIAGNOSIS — F329 Major depressive disorder, single episode, unspecified: Secondary | ICD-10-CM

## 2012-12-04 DIAGNOSIS — E119 Type 2 diabetes mellitus without complications: Secondary | ICD-10-CM | POA: Diagnosis not present

## 2012-12-04 DIAGNOSIS — M549 Dorsalgia, unspecified: Secondary | ICD-10-CM

## 2012-12-04 DIAGNOSIS — K6389 Other specified diseases of intestine: Secondary | ICD-10-CM

## 2012-12-04 DIAGNOSIS — F32A Depression, unspecified: Secondary | ICD-10-CM

## 2012-12-04 LAB — POCT GLYCOSYLATED HEMOGLOBIN (HGB A1C): Hemoglobin A1C: 6.5

## 2012-12-04 LAB — GLUCOSE, CAPILLARY: Glucose-Capillary: 208 mg/dL — ABNORMAL HIGH (ref 70–99)

## 2012-12-04 MED ORDER — DULOXETINE HCL 60 MG PO CPEP: 60.0000 mg | ORAL_CAPSULE | Freq: Every day | ORAL | Status: DC

## 2012-12-04 MED ORDER — DULOXETINE HCL 20 MG PO CPEP: ORAL_CAPSULE | ORAL | Status: DC

## 2012-12-04 MED ORDER — FLUTICASONE-SALMETEROL 250-50 MCG/DOSE IN AEPB: 1.0000 | INHALATION_SPRAY | Freq: Two times a day (BID) | RESPIRATORY_TRACT | Status: DC

## 2012-12-04 MED ORDER — HYDROCODONE-ACETAMINOPHEN 5-325 MG PO TABS: 1.0000 | ORAL_TABLET | Freq: Two times a day (BID) | ORAL | Status: DC

## 2012-12-04 MED ORDER — MECLIZINE HCL 25 MG PO TABS: 25.0000 mg | ORAL_TABLET | Freq: Three times a day (TID) | ORAL | Status: DC | PRN

## 2012-12-04 NOTE — Progress Notes (Signed)
Subjective:   Patient ID: [name] Rice female   DOB: Jun 10, 1956 57 y.o.   MRN: [id number]  HPI: 57 year old woman with past medical history significant for type 2 diabetes mellitus, COPD on 2 litres home oxygen , hypertension, seborrheic dermatitis, diverticulitis and probable small intestine bacterial overgrowth presents to the clinic for a followup visit.  Patient reports that she has been feeling the return of her diarrhea symptoms for last one week. Of note patient was recently hospitalized for diverticulitis and there was a suspicion for small bowel  bacteria overgrowth in the setting of vitamin D deficiency and chronic diarrhea for last 2 years. She describes her diarrhea as watery and mucoid, uses bathroom 3-4 times a day on her worst days. Denies any blood or abdominal pain associated with it. Patient herself correlates that her diarrhea has been worse since she has changed her milk from to person to whole milk. She states that she was doing much better in the interim and she was trying to be lactose-free for few weeks since her hospital discharge.  She states that she has been sleeping a lot lately- would be tired and would stay for 2 days in a row , even missing some of her medications.  DM: Her evening blood sugars are running high reviewing his CBG log. Patient states that she misses her evening dose of insulin very often.   BPPV; reports that her BPPV is getting worse recently. She describes it as dizziness which is the feeling of room spinning associated with nausea and vomiting that gets worse with head movements. She has been running out of her meclizine. Cluster of cysts on the buttock and tail bone , first noticed 6 weeks ago - had similar problem 3-4 years ago . Finally clearing up .  Aches and pains:  She reports pain in hands, feet , shoulders- needs more vicodin. Last hydrocodone was last night thinks that 30 Vicodin some month hasn't lost her long and that's why she started  taking zolpidem and it has been making her more sleepy.      Past Medical History  Diagnosis Date  . Anxiety   . Arthritis   . Asthma   . COPD (chronic obstructive pulmonary disease)   . Depression   . Diabetes mellitus   . GERD (gastroesophageal reflux disease)   . Hypertension   . HLD (hyperlipidemia)   . Dizziness   . History of colonic polyps   . Lumbar pain 2011    per x-ray - Multilevel spondylosis  . Cervical dystonia   . Tremor CCENTRAL NERVOUS SYSTEM  . Torn rotator cuff RT SHOULDER  . Dermatitis   . Diarrhea   . History of kidney stones   . Recurrent boils     "SLUSTER BOILS" ON ABD AND LABIA  . History of seborrheic dermatitis     face,ears  . OSA on CPAP 2008  . Sleep apnea     USES C-PAP   Family History  Problem Relation Age of Onset  . Heart disease Brother   . Stroke Neg Hx   . Cancer Neg Hx    History   Social History  . Marital Status: Divorced    Spouse Name: N/A    Number of Children: N/A  . Years of Education: N/A   Occupational History  . Not on file.   Social History Main Topics  . Smoking status: Former Games developer  . Smokeless tobacco: Former Neurosurgeon    Quit date: 07/08/1997  .  Alcohol Use: No  . Drug Use: No  . Sexually Active: Not Currently   Other Topics Concern  . Not on file   Social History Narrative   Lives alone in [place].    Review of Systems: General: Denies fever, chills, diaphoresis, appetite change and fatigue. HEENT: Denies photophobia, eye pain, redness, hearing loss, ear pain, congestion, sore throat, rhinorrhea, sneezing, mouth sores, trouble swallowing, neck pain, neck stiffness and tinnitus. Respiratory: Denies SOB, DOE, cough, chest tightness, and wheezing. Cardiovascular: Denies to chest pain, palpitations and leg swelling. Gastrointestinal: Denies nausea, vomiting, abdominal pain, + diarrhea, constipation, blood in stool and abdominal distention. Genitourinary: Denies dysuria, urgency, frequency, hematuria,  flank pain and difficulty urinating. Musculoskeletal: Denies myalgias, back pain, joint swelling, arthralgias and gait problem.  Skin: Denies pallor, rash and wound. Neurological: Denies dizziness, seizures, syncope, weakness, light-headedness, numbness and headaches. Hematological: Denies adenopathy, easy bruising, personal or family bleeding history. Psychiatric/Behavioral: Denies suicidal ideation, mood changes, confusion, nervousness, sleep disturbance and agitation.    Current Outpatient Medications: Current Outpatient Prescriptions  Medication Sig Dispense Refill  . ADVAIR DISKUS 250-50 MCG/DOSE AEPB Inhale 1 puff into the lungs 2 (two) times daily.      Marland Kitchen albuterol (VENTOLIN HFA) 108 (90 BASE) MCG/ACT inhaler Inhale 2 puffs into the lungs every 6 (six) hours as needed. Wheezing       . allopurinol (ZYLOPRIM) 300 MG tablet TAKE 1 TABLET BY MOUTH DAILY  30 tablet  3  . B-D INS SYR ULTRAFINE 1CC/30G 30G X 1/2" 1 ML MISC USE AS DIRECTED WITH EVERY MEAL AND AT BEDTIME  100 each  2  . ciprofloxacin (CIPRO) 500 MG tablet Take 1 tablet (500 mg total) by mouth 2 (two) times daily.  10 tablet  0  . DULoxetine (CYMBALTA) 60 MG capsule Take one capsule daily by mouth  90 capsule  0  . ferrous sulfate 325 (65 FE) MG tablet TAKE 1 TABLET BY MOUTH DAILY  30 tablet  0  . fluconazole (DIFLUCAN) 150 MG tablet Take 1 tab by mouth today and repeat it in 72 hours if needed.  2 tablet  0  . furosemide (LASIX) 40 MG tablet TAKE 1 TABLET BY MOUTH DAILY  90 tablet  0  . gabapentin (NEURONTIN) 300 MG capsule Take 1 capsule (300 mg total) by mouth 3 (three) times daily.  90 capsule  3  . HYDROcodone-acetaminophen (NORCO/VICODIN) 5-325 MG per tablet TAKE 1 TABLET BY MOUTH EVERY 6 HOURS AS NEEDED FOR PAIN  30 tablet  0  . insulin aspart (NOVOLOG) 100 UNIT/ML injection Inject 60 Units into the skin 3 (three) times daily before meals.       Marland Kitchen LANTUS 100 UNIT/ML injection INJECT 120 UNITS SUBCUTANEOUSLY DAILY WITH  BREAKFAST  40 mL  0  . metFORMIN (GLUCOPHAGE) 1000 MG tablet Take 1 tablet (1,000 mg total) by mouth 2 (two) times daily with a meal.  180 tablet  3  . methocarbamol (ROBAXIN) 500 MG tablet Take 500 mg by mouth 2 (two) times daily.       . metroNIDAZOLE (FLAGYL) 500 MG tablet Take 1 tablet (500 mg total) by mouth every 8 (eight) hours.  15 tablet  0  . montelukast (SINGULAIR) 10 MG tablet Take 1 tablet (10 mg total) by mouth at bedtime.  96 tablet  2  . NASONEX 50 MCG/ACT nasal spray USE 2 SPRAYS IN EACH NOSTRIL THE DAILY  1 Inhaler  3  . olopatadine (PATANOL) 0.1 % ophthalmic solution Place  1 drop into both eyes 2 (two) times daily as needed. Itching eyes  5 mL  2  . ondansetron (ZOFRAN) 4 MG tablet Take 1 tablet (4 mg total) by mouth every 6 (six) hours.  12 tablet  0  . ONETOUCH DELICA LANCETS FINE MISC 1 each by Does not apply route 4 (four) times daily.  200 each  1  . ONETOUCH VERIO test strip USE AS DIRECTED THREE TIMES DAILY BEFORE MEALS  100 each  12  . pantoprazole (PROTONIX) 40 MG tablet Take 1 tablet (40 mg total) by mouth daily.  90 tablet  3  . pravastatin (PRAVACHOL) 40 MG tablet Take 1 tablet (40 mg total) by mouth daily.  90 tablet  3  . propranolol (INDERAL) 20 MG tablet Take 1 tablet (20 mg total) by mouth 2 (two) times daily.  60 tablet  11  . selenium sulfide (SELSUN BLUE DAILY) 1 % LOTN Apply 1 application topically once a week. For face      . Syringe/Needle, Disp, (B-D ECLIPSE SYRINGE) 30G X 1/2" 1 ML MISC 1 Syringe by Does not apply route 3 (three) times daily.        . Vitamin D, Ergocalciferol, (DRISDOL) 50000 UNITS CAPS Take 1 capsule (50,000 Units total) by mouth every 7 (seven) days.  10 capsule  0  . zolpidem (AMBIEN) 5 MG tablet TAKE ONE TABLET BY MOUTH ONCE DAILY AT BEDTIME AS NEEDED FOR SLEEP  15 tablet  0   No current facility-administered medications for this visit.    Allergies: Allergies  Allergen Reactions  . Ace Inhibitors     Causes blood pressure to  drop rapidly  . Cefaclor Hives    REACTION: hives  . Shrimp (Shellfish Allergy) Nausea And Vomiting  . Sulfonamide Derivatives Rash    REACTION: rash      Objective:   Physical Exam: Filed Vitals:   12/04/12 0814  BP: 129/68  Pulse: 74  Temp: 97 F (36.1 C)    General: Vital signs reviewed and noted. Well-developed, well-nourished, in no acute distress; alert, appropriate and cooperative throughout examination. Head: Normocephalic, atraumatic Lungs: Normal respiratory effort. Clear to auscultation BL without crackles or wheezes. Heart: RRR. S1 and S2 normal without gallop, murmur, or rubs. Abdomen:BS normoactive. Soft, Nondistended, non-tender.  No masses or organomegaly. Extremities: No pretibial edema.     Assessment & Plan:

## 2012-12-04 NOTE — Assessment & Plan Note (Addendum)
Patient reports having feeling more fatigued and sleepy lately, also wears oxygen mostly at night . She states that she wears her CPAP mask every night.  I think her symptoms could be not well controlled OSA.Patient had a sleep study long time ago and it could not be found in Epic. I think this patient may benefit from repeat sleep study or CPAP titration again given her symptoms. Patient states that she would discuss with advanced if she can just get CPAP titration study else , we would repeat this study altogether. - Would also d/c zolpidem

## 2012-12-04 NOTE — Assessment & Plan Note (Signed)
She was advised to schedule an appointment for mammogram that has been ordered.

## 2012-12-04 NOTE — Patient Instructions (Addendum)
General Instructions: Please schedule a follow up appointment in 3 months or sooner if needed . Please bring your medication bottles with your next appointment. Please take your medicines as prescribed. We will refer you to vestibular rehab for BPPV. Please try avoiding milk in your diet for 2 weeks.    Treatment Goals:  Goals (1 Years of Data) as of 12/04/12         As of Today 10/08/12 10/08/12 10/08/12 10/08/12     Blood Pressure    . Blood Pressure < 140/90  129/68 136/64 136/76 130/76 136/78    . Blood Pressure < 140/90  129/68 136/64 136/76 130/76 136/78     Result Component    . HEMOGLOBIN A1C < 7.0  6.5        . HEMOGLOBIN A1C < 7.0  6.5        . LDL CALC < 100          . LDL CALC < 130            Progress Toward Treatment Goals:  Treatment Goal 12/04/2012  Hemoglobin A1C at goal  Blood pressure at goal    Self Care Goals & Plans:  Self Care Goal 12/04/2012  Manage my medications take my medicines as prescribed; bring my medications to every visit; refill my medications on time  Monitor my health keep track of my blood glucose; bring my glucose meter and log to each visit  Eat healthy foods drink diet soda or water instead of juice or soda; eat more vegetables; eat foods that are low in salt  Be physically active -    Home Blood Glucose Monitoring 12/04/2012  Check my blood sugar -  When to check my blood sugar before meals     Care Management & Community Referrals:  Referral 12/04/2012  Referrals made for care management support none needed

## 2012-12-04 NOTE — Assessment & Plan Note (Addendum)
Patient presents with symptoms of return of watery, mucoid diarrhea for last week- has been having 3-4 episodes for last 2 days. She also reports that  lately she has introduced milk into her diet and has been drinking more as she has craving for it. Differentials include flare of interesting bacterial overgrowth v( history of diarrhea, iron deficiency and Vit- D low levels) versus lactose intolerance. I think she could also have a component of lactose intolerance to her illness. Of note patient states that she hasn't started taking up her vitamin D because she couldn't afford it. She states that she might be able to afford it now. -She was advised to not drink milk for at least 2-3 weeks and then introduced at slowly into her diet. -Observe off antibiotics for now -Advised her to call the clinic if her symptoms fail to improve in one to two weeks. -Encouraged her to start taking vitamin D supplementation.

## 2012-12-04 NOTE — Assessment & Plan Note (Signed)
He continues to report chronic back pain and shoulder pain. She also reports pain in her other joints including hands and feet. -Would increase her Vicodin from 30 to45 tablets a month. -Pain contract was renewed. -UDS with next visit.

## 2012-12-04 NOTE — Assessment & Plan Note (Signed)
She continues to report more frequent episodes of vertigo ( room spinning with head movements) associated with nausea and vomiting lasting for a few minutes- consistent with her BPPV. -PRN meclizine. Prescription was refilled. -Would refer to vestibular rehabilitation again

## 2012-12-04 NOTE — Assessment & Plan Note (Signed)
Her diabetes is fairly well controlled.  I think this good control could also be confounded by her missing evening meals lately (and insulin as well ) lately. Patient seems to have a good understanding of her regimen and when not to take insulin. CBG log was reviewed and no hypoglycemic events were noted .Continue current regimen for now.

## 2012-12-05 ENCOUNTER — Other Ambulatory Visit: Payer: Self-pay | Admitting: Internal Medicine

## 2012-12-06 ENCOUNTER — Ambulatory Visit: Payer: Medicare Other | Admitting: Rehabilitative and Restorative Service Providers"

## 2012-12-12 ENCOUNTER — Other Ambulatory Visit: Payer: Self-pay | Admitting: Internal Medicine

## 2012-12-12 ENCOUNTER — Ambulatory Visit (HOSPITAL_COMMUNITY): Payer: Medicare Other

## 2012-12-23 ENCOUNTER — Other Ambulatory Visit: Payer: Self-pay | Admitting: Internal Medicine

## 2012-12-23 ENCOUNTER — Ambulatory Visit (HOSPITAL_BASED_OUTPATIENT_CLINIC_OR_DEPARTMENT_OTHER): Payer: Medicare Other

## 2012-12-26 ENCOUNTER — Encounter (HOSPITAL_BASED_OUTPATIENT_CLINIC_OR_DEPARTMENT_OTHER): Payer: Medicare Other

## 2013-01-03 ENCOUNTER — Ambulatory Visit (HOSPITAL_COMMUNITY)
Admission: RE | Admit: 2013-01-03 | Discharge: 2013-01-03 | Disposition: A | Discharge status: Home / Self Care | Payer: Medicare Other | Source: Ambulatory Visit | Attending: Internal Medicine | Admitting: Internal Medicine

## 2013-01-03 ENCOUNTER — Other Ambulatory Visit: Payer: Self-pay | Admitting: Internal Medicine

## 2013-01-03 DIAGNOSIS — Z1231 Encounter for screening mammogram for malignant neoplasm of breast: Secondary | ICD-10-CM

## 2013-01-04 ENCOUNTER — Encounter: Payer: Medicare Other | Admitting: Internal Medicine

## 2013-01-04 ENCOUNTER — Encounter: Payer: Self-pay | Admitting: Internal Medicine

## 2013-01-04 NOTE — Telephone Encounter (Signed)
Rx called in to pharmacy. Stanton Kidney Mayce Noyes RN 01/04/13 10:30AM

## 2013-01-09 ENCOUNTER — Other Ambulatory Visit: Payer: Self-pay | Admitting: Internal Medicine

## 2013-01-09 DIAGNOSIS — N63 Unspecified lump in unspecified breast: Secondary | ICD-10-CM

## 2013-01-10 ENCOUNTER — Ambulatory Visit (HOSPITAL_BASED_OUTPATIENT_CLINIC_OR_DEPARTMENT_OTHER): Payer: Medicare Other | Attending: Internal Medicine

## 2013-01-10 VITALS — Ht 63.0 in | Wt 265.0 lb

## 2013-01-10 DIAGNOSIS — G4761 Periodic limb movement disorder: Secondary | ICD-10-CM | POA: Insufficient documentation

## 2013-01-10 DIAGNOSIS — G4710 Hypersomnia, unspecified: Secondary | ICD-10-CM | POA: Insufficient documentation

## 2013-01-10 DIAGNOSIS — G4733 Obstructive sleep apnea (adult) (pediatric): Secondary | ICD-10-CM

## 2013-01-19 DIAGNOSIS — R0989 Other specified symptoms and signs involving the circulatory and respiratory systems: Secondary | ICD-10-CM

## 2013-01-19 DIAGNOSIS — G4761 Periodic limb movement disorder: Secondary | ICD-10-CM

## 2013-01-19 DIAGNOSIS — G4710 Hypersomnia, unspecified: Secondary | ICD-10-CM

## 2013-01-19 DIAGNOSIS — G4730 Sleep apnea, unspecified: Secondary | ICD-10-CM

## 2013-01-19 DIAGNOSIS — R0609 Other forms of dyspnea: Secondary | ICD-10-CM

## 2013-01-19 NOTE — Procedures (Signed)
[name] Rice, [name] Rice                ACCOUNT NO.:  [id number]  MEDICAL RECORD NO.:  [id number]          PATIENT TYPE:  OUT  LOCATION:  SLEEP CENTER                 FACILITY:  [facility]  PHYSICIAN:  Taygen Newsome D. Maple Hudson, MD, FCCP, FACPDATE OF BIRTH:  01-30-1956  DATE OF STUDY:  01/10/2013                           NOCTURNAL POLYSOMNOGRAM  REFERRING PHYSICIAN:  Jonah Blue  REFERRING PHYSICIAN:  Elyse Jarvis, MD.  INDICATION FOR STUDY:  Hypersomnia with sleep apnea.  EPWORTH SLEEPINESS SCORE:  15/24.  BMI 46.9, weight 265 pounds.  Height 63 inches.  Neck 17 inches.  MEDICATIONS:  Home medications are charted and reviewed.  SLEEP ARCHITECTURE:  Total sleep time 201.5 minutes with sleep efficiency of 49.3%.  Stage I was 34% stage II was 66%, stage III and REM were absent.  Sleep latency 69 minutes.  Awake after sleep onset 136 minutes.  Arousal index 23.2.  Bedtime medication, Vicodin.  Sustained sleep was not achieved until 1:00 a.m.  RESPIRATORY DATA:  Apnea-hypopnea index (AHI) 32.8 per hour.  A total of 110 events were scored, including 37 obstructive apneas and 73 hypopneas.  Events were associated with non-supine position.  Because the patient did not sustain sleep until after 1:00 a.m., split protocol CPAP titration could not be performed.  OXYGEN DATA:  Loud snoring with oxygen desaturation to a nadir of 75% and mean oxygen saturation through the study of 91.4% on room air.  CARDIAC DATA:  Normal sinus rhythm.  MOVEMENT/PARASOMNIA:  A total of 262 limb jerks were counted, of which 10 were associated with arousals or awakening for periodic limb movement with arousal index of 3 per hour.  No bathroom trips.  IMPRESSION/RECOMMENDATION: 1. The patient was unable to sustain sleep until after 1:00 a.m.  This     may reflect for being used to wearing CPAP for sleep.  The study     was performed without CPAP because she did not meet the sustained     sleep requirements for  initiation of split protocol CPAP titration.     Snoring was very loud with oxygen desaturation to a nadir of 75%     and mean oxygen saturation through the study of 91.4% on room air. 2. Consider return for dedicated CPAP titration or evaluate for     alternative management as clinically appropriate. 3. On arrival on room air oxygen saturation was 90% while upright and     awake.  This suggests underlying cardiopulmonary disease. 4. Periodic limb movement with arousal syndrome.  A total of 262 limb     jerks were counted, of which 10 were associated with arousals or     awakening for periodic limb movement with arousal index of 3 per     hour.  After apnea status is stabilized, if there is persistent     sleep disturbance associated with limb jerks, then a trial of     specific therapy such as ReQuip or Mirapex might be appropriate.     Jalee Saine D. Maple Hudson, MD, Tonny Bollman, FACP Diplomate, American Board of Sleep Medicine    CDY/MEDQ  D:  01/19/2013 13:53:52  T:  01/19/2013 23:11:11  Job:  [id number]

## 2013-01-21 ENCOUNTER — Other Ambulatory Visit: Payer: Self-pay | Admitting: Internal Medicine

## 2013-01-28 NOTE — Addendum Note (Signed)
Addended by: Neomia Dear on: 01/28/2013 06:20 PM   Modules accepted: Orders

## 2013-01-31 ENCOUNTER — Telehealth: Payer: Self-pay | Admitting: *Deleted

## 2013-01-31 NOTE — Telephone Encounter (Signed)
I returned her call but patient was not available>>left message. Patient most likely has possible diagnosis of small bowel bacterial overgrowth syndrome and has been prescribed course of cipro and flagyl for worsening symptoms in the past . As per the note, her diarrheal symptoms have resolved and she is still having some abdominal pain, I do not feel prescribing any antibiotics without talking to her before, especially when her diarrhea has resolved.   If her abdominal pain and diarrhea, gets worse- I would like her to be seen in the clinic.  Thanks, [employer]

## 2013-01-31 NOTE — Telephone Encounter (Signed)
Call from pt - requesting refills on Cipro and Flagyl. States she had call about 3 weeks ago ( no note in chart) when she was having diarrhea. Now stools are formed but having pain at "belly button". States she thinks her diverticulitis has flared-up and was told to call when this happenings and her doctor will refill the rxs. I will send message to her PCP.  thanks

## 2013-02-12 ENCOUNTER — Ambulatory Visit: Payer: Medicare Other | Admitting: Internal Medicine

## 2013-02-12 ENCOUNTER — Other Ambulatory Visit: Payer: Self-pay | Admitting: Internal Medicine

## 2013-02-14 ENCOUNTER — Other Ambulatory Visit: Payer: Self-pay | Admitting: Internal Medicine

## 2013-02-21 ENCOUNTER — Encounter: Payer: Self-pay | Admitting: Internal Medicine

## 2013-02-21 ENCOUNTER — Ambulatory Visit (INDEPENDENT_AMBULATORY_CARE_PROVIDER_SITE_OTHER): Payer: Medicare Other | Admitting: Internal Medicine

## 2013-02-21 VITALS — BP 141/76 | HR 82 | Temp 98.4°F | Ht 63.0 in | Wt 265.9 lb

## 2013-02-21 DIAGNOSIS — E559 Vitamin D deficiency, unspecified: Secondary | ICD-10-CM

## 2013-02-21 DIAGNOSIS — I10 Essential (primary) hypertension: Secondary | ICD-10-CM | POA: Diagnosis not present

## 2013-02-21 DIAGNOSIS — IMO0002 Reserved for concepts with insufficient information to code with codable children: Secondary | ICD-10-CM

## 2013-02-21 DIAGNOSIS — E1149 Type 2 diabetes mellitus with other diabetic neurological complication: Secondary | ICD-10-CM

## 2013-02-21 DIAGNOSIS — K6389 Other specified diseases of intestine: Secondary | ICD-10-CM | POA: Diagnosis not present

## 2013-02-21 DIAGNOSIS — Z9989 Dependence on other enabling machines and devices: Secondary | ICD-10-CM

## 2013-02-21 DIAGNOSIS — G4733 Obstructive sleep apnea (adult) (pediatric): Secondary | ICD-10-CM

## 2013-02-21 LAB — GLUCOSE, CAPILLARY: Glucose-Capillary: 213 mg/dL — ABNORMAL HIGH (ref 70–99)

## 2013-02-21 LAB — POCT GLYCOSYLATED HEMOGLOBIN (HGB A1C): Hemoglobin A1C: 7.8

## 2013-02-21 MED ORDER — VITAMIN D (ERGOCALCIFEROL) 1.25 MG (50000 UNIT) PO CAPS: 50000.0000 [IU] | ORAL_CAPSULE | ORAL | Status: DC

## 2013-02-21 MED ORDER — CIPROFLOXACIN HCL 500 MG PO TABS: 500.0000 mg | ORAL_TABLET | Freq: Two times a day (BID) | ORAL | Status: DC

## 2013-02-21 NOTE — Assessment & Plan Note (Signed)
Likely Second episode. Started first week of April. Presumptive Diagnosis. Treated with Cipro and Flagyl last time in November 2013 as patient had diverticulitis.  - Treat with ciprofloxacin 500 mg twice daily for one week. - GI referral with Dr. Marina Goodell- 03/19/2013 - Return office visit in 7-10 days.

## 2013-02-21 NOTE — Assessment & Plan Note (Signed)
Sleep study performed in April 2014 for CPAP titration but was not helpful as not enough time of sleep during the study to titrate CPAP. - Patient might need repeat study. - She was wondering if she can get out of titration CPAP at home. - Will discuss about this during next visit in 7-10 days.

## 2013-02-21 NOTE — Assessment & Plan Note (Signed)
Treated with gabapentin and Vicodin. Pain getting worse with radiation to bilateral leg. - I will not increase the dose of Vicodin today. - Reevaluate in 7-10 days.

## 2013-02-21 NOTE — Assessment & Plan Note (Signed)
BP Readings from Last 3 Encounters:  02/21/13 141/76  12/04/12 129/68  10/08/12 136/64    Lab Results  Component Value Date   NA 141 10/02/2012   K 3.6 10/02/2012   CREATININE 0.97 10/02/2012    Assessment: Blood pressure control:   fairly good Progress toward BP goal:    mild worsening of systolic. Comments:   Plan: Medications:  continue current medications, Continue Lasix and propranolol. Educational resources provided: Comptroller tools provided:   Other plans: No changes made today.

## 2013-02-21 NOTE — Progress Notes (Signed)
  Subjective:    Patient ID: [name] Rice, female    DOB: 01-25-1956, 57 y.o.   MRN: [id number]  HPI patient is a 57 year woman with type II DM, hypertension, hyperlipidemia, diverticulitis, intestinal bacterial overgrowth and multiple medical problems as per problem list comes the clinic for diarrhea since April 2014 Patient describes having loose stools multiple times every day since first week of April. She has 5 bed days every week. She might have to be in the bathroom for about 2 hours at a time. She does not have any blood in stool. She does not have associated abdominal pain with movements. She is taking vitamin D as prescribed. She denies any fever, chills, nausea, vomiting, chest pain, short of breath. She does keep up with fluid loss.  She wants to know about her sleep study done last month.  She also reports of worsening low back pain with radiation to bilateral leg. She's been treated with Neurontin and Vicodin but says that the dose is not appropriate.     Review of Systems    as per history of present illness. Objective:   Physical Exam  General: NAD HEENT: PERRL, EOMI, no scleral icterus Cardiac: RRR, no rubs, murmurs or gallops Pulm: clear to auscultation bilaterally, moving normal volumes of air Abd: soft, obese, nontender, BS present Ext: warm and well perfused, no pedal edema Neuro: alert and oriented X3, cranial nerves II-XII grossly intact       Assessment & Plan:

## 2013-02-21 NOTE — Patient Instructions (Signed)
Please make a follow appointment in 7-10 days.  Get followup with gastroenterology.  Will discuss about your back pain during next visit.  Continue taking all medications regularly.

## 2013-02-21 NOTE — Assessment & Plan Note (Signed)
Lab Results  Component Value Date   HGBA1C 7.8 02/21/2013   HGBA1C 6.5 12/04/2012   HGBA1C 6.8 08/20/2012     Assessment: Diabetes control:   fairly good Progress toward A1C goal:    worsening Comments:   Plan: Medications:  continue current medications, Continue NovoLog 60 units 3 days before meals and Lantus along with metformin. Home glucose monitoring: Frequency:   Timing:   Instruction/counseling given: discussed the need for weight loss and discussed diet Educational resources provided: brochure;handout Self management tools provided:   Other plans: No changes made. Patient says that she's not able to eat properly due to her diarrhea. She wakes up at night and eats more and that's why her A1c is higher.

## 2013-02-22 NOTE — Progress Notes (Signed)
Case discussed with Dr. Patel immediately after the resident saw the patient.  We reviewed the resident's history and exam and pertinent patient test results.  I agree with the assessment, diagnosis and plan of care documented in the resident's note. 

## 2013-03-01 ENCOUNTER — Ambulatory Visit (INDEPENDENT_AMBULATORY_CARE_PROVIDER_SITE_OTHER): Payer: Medicare Other | Admitting: Internal Medicine

## 2013-03-01 ENCOUNTER — Encounter: Payer: Self-pay | Admitting: Internal Medicine

## 2013-03-01 VITALS — BP 177/87 | HR 68 | Temp 97.4°F | Ht 63.25 in | Wt 269.1 lb

## 2013-03-01 DIAGNOSIS — N6001 Solitary cyst of right breast: Secondary | ICD-10-CM

## 2013-03-01 DIAGNOSIS — Z0000 Encounter for general adult medical examination without abnormal findings: Secondary | ICD-10-CM | POA: Diagnosis not present

## 2013-03-01 DIAGNOSIS — I10 Essential (primary) hypertension: Secondary | ICD-10-CM

## 2013-03-01 DIAGNOSIS — G4733 Obstructive sleep apnea (adult) (pediatric): Secondary | ICD-10-CM

## 2013-03-01 DIAGNOSIS — N6009 Solitary cyst of unspecified breast: Secondary | ICD-10-CM | POA: Diagnosis not present

## 2013-03-01 DIAGNOSIS — Z9989 Dependence on other enabling machines and devices: Secondary | ICD-10-CM

## 2013-03-01 DIAGNOSIS — IMO0002 Reserved for concepts with insufficient information to code with codable children: Secondary | ICD-10-CM

## 2013-03-01 DIAGNOSIS — G250 Essential tremor: Secondary | ICD-10-CM | POA: Diagnosis not present

## 2013-03-01 DIAGNOSIS — H9209 Otalgia, unspecified ear: Secondary | ICD-10-CM

## 2013-03-01 DIAGNOSIS — H9201 Otalgia, right ear: Secondary | ICD-10-CM

## 2013-03-01 MED ORDER — HYDROCODONE-ACETAMINOPHEN 5-325 MG PO TABS: ORAL_TABLET | ORAL | Status: DC

## 2013-03-01 MED ORDER — NEOMYCIN-POLYMYXIN-HC 3.5-10000-1 OT SOLN: 3.0000 [drp] | Freq: Four times a day (QID) | OTIC | Status: DC

## 2013-03-01 NOTE — Assessment & Plan Note (Signed)
Mild worsening of pain due to recent illnesses. Patient gets 45 tablets of Vicodin 5/325 per month. I will give her 60 tablets for this refill once.  - Discussed with her in detail about possibility of worsening of her small bowel better or worse with her diabetes and narcotic medications,and possibility of the GI physician seeing her on 03/19/2013 might ask her to stop that for a while. - She verbalized understanding and appreciated me discussing this with her. - If she needs continuous increased dose of Vicodin, she will need to renew the contract.

## 2013-03-01 NOTE — Assessment & Plan Note (Signed)
Continue propranolol 20 mg daily. Followed by a neurologist.

## 2013-03-01 NOTE — Patient Instructions (Signed)
Please make followup appointment in 4-6 weeks.  Followup with breast Center for ultrasound, GI doctor on 03/19/2013 and sleep study.  I will give you the prescription of Vicodin 60 pills today.  If you continue to need 60 pills every month from the prescription, you might have to talk to your new primary care physician and change the pain contract.

## 2013-03-01 NOTE — Assessment & Plan Note (Signed)
Patient will need repeat sleep study just for CPAP titration. Will place the order today.

## 2013-03-01 NOTE — Assessment & Plan Note (Signed)
Ultrasound breast to be performed at the breast Center.  Order placed again.

## 2013-03-01 NOTE — Progress Notes (Signed)
  Subjective:    Patient ID: [name] Rice, female    DOB: Jan 27, 1956, 57 y.o.   MRN: [id number]  HPI patient is a 57 year old woman with type II DM, hypertension, COPD, sleep apnea, other problems as per problem list who comes the clinic for followup visit for her back pain, diarrhea. I saw her a week before for diarrhea and she was prescribed ciprofloxacin for 7 days for second flareup of small bowel bacterial overgrowth.  She feels much better. Her symptoms have almost resolved. She denies any fever, chills, nausea, vomiting, diarrhea, headache, palpitations.  She is still waiting for ultrasound of breast to be performed, as she was told to get that rather than mammogram in March 2014.  She also has chronic back pain, for which she has increase in need of her pain medication. She would like to increase the dose of that if possible. She says the pain is chronic for many years and has not got significantly worse but due to recent illnesses, she has more pain than usual. Denies any loss of bowel or bladder function or any lower extremity weakness or numbness.  She also wants to get the sleep study again for titration of CPAP.   Review of Systems    As per HPI Objective:   Physical Exam  General: NAD. HEENT: PERRL, EOMI, no scleral icterus Cardiac: S1, S2, RRR, no rubs, murmurs or gallops Pulm: clear to auscultation bilaterally, moving normal volumes of air Abd: soft, nontender, nondistended, BS present Ext: warm and well perfused, no pedal edema Neuro: alert and oriented X3, cranial nerves II-XII grossly intact        Assessment & Plan:

## 2013-03-01 NOTE — Assessment & Plan Note (Signed)
Systolic blood pressure 177 today. Patient says that she is not taking her Lasix as usual because she was sick lately. Advised her to start her Lasix back regularly. -Reevaluate during next visit, and consider adding another antihypertensive if needed.

## 2013-03-04 NOTE — Progress Notes (Signed)
INTERNAL MEDICINE TEACHING ATTENDING ADDENDUM: I discussed this case with Dr. Patel soon after the patient visit. I have read the documentation and I agree with the plan of care. Please see the resident note for details of management.  

## 2013-03-06 ENCOUNTER — Other Ambulatory Visit: Payer: Self-pay | Admitting: Internal Medicine

## 2013-03-06 ENCOUNTER — Encounter: Payer: Self-pay | Admitting: Internal Medicine

## 2013-03-17 ENCOUNTER — Encounter (HOSPITAL_BASED_OUTPATIENT_CLINIC_OR_DEPARTMENT_OTHER): Payer: Medicare Other

## 2013-03-18 ENCOUNTER — Other Ambulatory Visit: Payer: Self-pay | Admitting: Internal Medicine

## 2013-03-19 ENCOUNTER — Other Ambulatory Visit: Payer: Self-pay | Admitting: Internal Medicine

## 2013-03-19 ENCOUNTER — Ambulatory Visit: Payer: Medicare Other | Admitting: Internal Medicine

## 2013-03-21 ENCOUNTER — Ambulatory Visit: Payer: Medicare Other | Admitting: Internal Medicine

## 2013-03-26 ENCOUNTER — Other Ambulatory Visit: Payer: Self-pay | Admitting: Internal Medicine

## 2013-03-31 ENCOUNTER — Inpatient Hospital Stay (HOSPITAL_COMMUNITY)
Admission: EM | Admit: 2013-03-31 | Discharge: 2013-04-04 | DRG: 191 | Disposition: A | Discharge status: Home / Self Care | Payer: Medicare Other | Attending: Internal Medicine | Admitting: Internal Medicine

## 2013-03-31 ENCOUNTER — Encounter (HOSPITAL_COMMUNITY): Payer: Self-pay

## 2013-03-31 ENCOUNTER — Emergency Department (HOSPITAL_COMMUNITY): Payer: Medicare Other

## 2013-03-31 DIAGNOSIS — I10 Essential (primary) hypertension: Secondary | ICD-10-CM | POA: Diagnosis not present

## 2013-03-31 DIAGNOSIS — E1142 Type 2 diabetes mellitus with diabetic polyneuropathy: Secondary | ICD-10-CM | POA: Diagnosis present

## 2013-03-31 DIAGNOSIS — Z794 Long term (current) use of insulin: Secondary | ICD-10-CM

## 2013-03-31 DIAGNOSIS — Z6841 Body Mass Index (BMI) 40.0 and over, adult: Secondary | ICD-10-CM

## 2013-03-31 DIAGNOSIS — N289 Disorder of kidney and ureter, unspecified: Secondary | ICD-10-CM | POA: Diagnosis present

## 2013-03-31 DIAGNOSIS — G4733 Obstructive sleep apnea (adult) (pediatric): Secondary | ICD-10-CM | POA: Diagnosis not present

## 2013-03-31 DIAGNOSIS — I517 Cardiomegaly: Secondary | ICD-10-CM | POA: Diagnosis not present

## 2013-03-31 DIAGNOSIS — N182 Chronic kidney disease, stage 2 (mild): Secondary | ICD-10-CM | POA: Diagnosis not present

## 2013-03-31 DIAGNOSIS — F3289 Other specified depressive episodes: Secondary | ICD-10-CM | POA: Diagnosis present

## 2013-03-31 DIAGNOSIS — G8929 Other chronic pain: Secondary | ICD-10-CM | POA: Diagnosis present

## 2013-03-31 DIAGNOSIS — I152 Hypertension secondary to endocrine disorders: Secondary | ICD-10-CM | POA: Diagnosis present

## 2013-03-31 DIAGNOSIS — Z79899 Other long term (current) drug therapy: Secondary | ICD-10-CM

## 2013-03-31 DIAGNOSIS — E872 Acidosis, unspecified: Secondary | ICD-10-CM | POA: Diagnosis present

## 2013-03-31 DIAGNOSIS — R0609 Other forms of dyspnea: Secondary | ICD-10-CM

## 2013-03-31 DIAGNOSIS — K219 Gastro-esophageal reflux disease without esophagitis: Secondary | ICD-10-CM | POA: Diagnosis present

## 2013-03-31 DIAGNOSIS — R0989 Other specified symptoms and signs involving the circulatory and respiratory systems: Secondary | ICD-10-CM | POA: Diagnosis not present

## 2013-03-31 DIAGNOSIS — R0602 Shortness of breath: Secondary | ICD-10-CM | POA: Diagnosis present

## 2013-03-31 DIAGNOSIS — J441 Chronic obstructive pulmonary disease with (acute) exacerbation: Secondary | ICD-10-CM

## 2013-03-31 DIAGNOSIS — M549 Dorsalgia, unspecified: Secondary | ICD-10-CM | POA: Diagnosis present

## 2013-03-31 DIAGNOSIS — I129 Hypertensive chronic kidney disease with stage 1 through stage 4 chronic kidney disease, or unspecified chronic kidney disease: Secondary | ICD-10-CM | POA: Diagnosis present

## 2013-03-31 DIAGNOSIS — R259 Unspecified abnormal involuntary movements: Secondary | ICD-10-CM | POA: Diagnosis present

## 2013-03-31 DIAGNOSIS — M5430 Sciatica, unspecified side: Secondary | ICD-10-CM | POA: Diagnosis present

## 2013-03-31 DIAGNOSIS — E785 Hyperlipidemia, unspecified: Secondary | ICD-10-CM | POA: Diagnosis present

## 2013-03-31 DIAGNOSIS — J449 Chronic obstructive pulmonary disease, unspecified: Secondary | ICD-10-CM | POA: Diagnosis present

## 2013-03-31 DIAGNOSIS — I5031 Acute diastolic (congestive) heart failure: Secondary | ICD-10-CM | POA: Diagnosis not present

## 2013-03-31 DIAGNOSIS — J9819 Other pulmonary collapse: Secondary | ICD-10-CM | POA: Diagnosis not present

## 2013-03-31 DIAGNOSIS — Z96619 Presence of unspecified artificial shoulder joint: Secondary | ICD-10-CM

## 2013-03-31 DIAGNOSIS — E1149 Type 2 diabetes mellitus with other diabetic neurological complication: Secondary | ICD-10-CM | POA: Diagnosis not present

## 2013-03-31 DIAGNOSIS — Z87891 Personal history of nicotine dependence: Secondary | ICD-10-CM | POA: Diagnosis not present

## 2013-03-31 DIAGNOSIS — F329 Major depressive disorder, single episode, unspecified: Secondary | ICD-10-CM | POA: Diagnosis present

## 2013-03-31 DIAGNOSIS — E119 Type 2 diabetes mellitus without complications: Secondary | ICD-10-CM

## 2013-03-31 DIAGNOSIS — J4489 Other specified chronic obstructive pulmonary disease: Secondary | ICD-10-CM | POA: Diagnosis present

## 2013-03-31 LAB — HEPATIC FUNCTION PANEL
Albumin: 3.6 g/dL (ref 3.5–5.2)
Total Protein: 7.2 g/dL (ref 6.0–8.3)

## 2013-03-31 LAB — BASIC METABOLIC PANEL
CO2: 25 mEq/L (ref 19–32)
Calcium: 9.6 mg/dL (ref 8.4–10.5)
GFR calc non Af Amer: 72 mL/min — ABNORMAL LOW (ref >90)
Potassium: 4.1 mEq/L (ref 3.5–5.1)
Sodium: 138 mEq/L (ref 135–145)

## 2013-03-31 LAB — GLUCOSE, CAPILLARY
Glucose-Capillary: 154 mg/dL — ABNORMAL HIGH (ref 70–99)
Glucose-Capillary: 224 mg/dL — ABNORMAL HIGH (ref 70–99)

## 2013-03-31 LAB — POCT I-STAT TROPONIN I: Troponin i, poc: 0.03 ng/mL (ref 0.00–0.08)

## 2013-03-31 LAB — CBC
HCT: 39.5 % (ref 36.0–46.0)
Hemoglobin: 13.1 g/dL (ref 12.0–15.0)
MCHC: 33.2 g/dL (ref 30.0–36.0)
WBC: 6.9 10*3/uL (ref 4.0–10.5)

## 2013-03-31 LAB — PRO B NATRIURETIC PEPTIDE: Pro B Natriuretic peptide (BNP): 120.8 pg/mL (ref 0–125)

## 2013-03-31 MED ORDER — OLOPATADINE HCL 0.1 % OP SOLN: 1.0000 [drp] | Freq: Two times a day (BID) | OPHTHALMIC | Status: DC | PRN

## 2013-03-31 MED ORDER — ALBUTEROL SULFATE (5 MG/ML) 0.5% IN NEBU
2.5000 mg | INHALATION_SOLUTION | Freq: Four times a day (QID) | RESPIRATORY_TRACT | Status: DC | PRN
  Administered 2013-04-02: 2.5 mg via RESPIRATORY_TRACT
  Filled 2013-03-31: qty 0.5

## 2013-03-31 MED ORDER — PANTOPRAZOLE SODIUM 40 MG PO TBEC
40.0000 mg | DELAYED_RELEASE_TABLET | Freq: Every day | ORAL | Status: DC
  Administered 2013-04-01 – 2013-04-04 (×4): 40 mg via ORAL
  Filled 2013-03-31 (×4): qty 1

## 2013-03-31 MED ORDER — SODIUM CHLORIDE 0.9 % IJ SOLN: 3.0000 mL | INTRAMUSCULAR | Status: DC | PRN

## 2013-03-31 MED ORDER — GABAPENTIN 300 MG PO CAPS
300.0000 mg | ORAL_CAPSULE | Freq: Three times a day (TID) | ORAL | Status: DC
  Administered 2013-03-31 – 2013-04-04 (×11): 300 mg via ORAL
  Filled 2013-03-31 (×13): qty 1

## 2013-03-31 MED ORDER — ALBUTEROL (5 MG/ML) CONTINUOUS INHALATION SOLN
10.0000 mg/h | INHALATION_SOLUTION | Freq: Once | RESPIRATORY_TRACT | Status: AC
  Administered 2013-03-31: 10 mg/h via RESPIRATORY_TRACT
  Filled 2013-03-31: qty 20

## 2013-03-31 MED ORDER — FUROSEMIDE 40 MG PO TABS
40.0000 mg | ORAL_TABLET | Freq: Every day | ORAL | Status: DC
  Administered 2013-04-01 – 2013-04-04 (×4): 40 mg via ORAL
  Filled 2013-03-31 (×4): qty 1

## 2013-03-31 MED ORDER — ALLOPURINOL 300 MG PO TABS
300.0000 mg | ORAL_TABLET | Freq: Every day | ORAL | Status: DC
  Administered 2013-04-01 – 2013-04-04 (×4): 300 mg via ORAL
  Filled 2013-03-31 (×4): qty 1

## 2013-03-31 MED ORDER — SODIUM CHLORIDE 0.9 % IV SOLN: 250.0000 mL | INTRAVENOUS | Status: DC | PRN

## 2013-03-31 MED ORDER — IPRATROPIUM BROMIDE 0.02 % IN SOLN: 0.5000 mg | Freq: Four times a day (QID) | RESPIRATORY_TRACT | Status: DC | PRN

## 2013-03-31 MED ORDER — MECLIZINE HCL 25 MG PO TABS
25.0000 mg | ORAL_TABLET | Freq: Three times a day (TID) | ORAL | Status: DC | PRN
  Filled 2013-03-31: qty 1

## 2013-03-31 MED ORDER — METHYLPREDNISOLONE SODIUM SUCC 125 MG IJ SOLR
125.0000 mg | Freq: Once | INTRAMUSCULAR | Status: AC
  Administered 2013-03-31: 125 mg via INTRAVENOUS
  Filled 2013-03-31: qty 2

## 2013-03-31 MED ORDER — INSULIN ASPART 100 UNIT/ML ~~LOC~~ SOLN
0.0000 [IU] | Freq: Every day | SUBCUTANEOUS | Status: DC
  Administered 2013-03-31: 4 [IU] via SUBCUTANEOUS
  Administered 2013-04-02 – 2013-04-03 (×2): 2 [IU] via SUBCUTANEOUS

## 2013-03-31 MED ORDER — FLUTICASONE PROPIONATE 50 MCG/ACT NA SUSP
1.0000 | Freq: Every day | NASAL | Status: DC | PRN
  Administered 2013-04-02: 1 via NASAL
  Filled 2013-03-31 (×2): qty 16

## 2013-03-31 MED ORDER — MONTELUKAST SODIUM 10 MG PO TABS
10.0000 mg | ORAL_TABLET | Freq: Every day | ORAL | Status: DC
  Administered 2013-03-31 – 2013-04-03 (×4): 10 mg via ORAL
  Filled 2013-03-31 (×5): qty 1

## 2013-03-31 MED ORDER — VITAMIN D (ERGOCALCIFEROL) 1.25 MG (50000 UNIT) PO CAPS
50000.0000 [IU] | ORAL_CAPSULE | ORAL | Status: DC
  Administered 2013-04-02: 50000 [IU] via ORAL
  Filled 2013-03-31 (×2): qty 1

## 2013-03-31 MED ORDER — INSULIN ASPART 100 UNIT/ML ~~LOC~~ SOLN
40.0000 [IU] | Freq: Three times a day (TID) | SUBCUTANEOUS | Status: DC
  Administered 2013-04-01 (×2): 40 [IU] via SUBCUTANEOUS

## 2013-03-31 MED ORDER — IPRATROPIUM BROMIDE 0.02 % IN SOLN
0.5000 mg | Freq: Once | RESPIRATORY_TRACT | Status: AC
  Administered 2013-03-31: 0.5 mg via RESPIRATORY_TRACT
  Filled 2013-03-31: qty 2.5

## 2013-03-31 MED ORDER — HEPARIN SODIUM (PORCINE) 5000 UNIT/ML IJ SOLN
5000.0000 [IU] | Freq: Three times a day (TID) | INTRAMUSCULAR | Status: DC
  Administered 2013-03-31 – 2013-04-04 (×11): 5000 [IU] via SUBCUTANEOUS
  Filled 2013-03-31 (×14): qty 1

## 2013-03-31 MED ORDER — SODIUM CHLORIDE 0.9 % IV BOLUS (SEPSIS)
1000.0000 mL | Freq: Once | INTRAVENOUS | Status: AC
  Administered 2013-03-31: 1000 mL via INTRAVENOUS

## 2013-03-31 MED ORDER — LEVOFLOXACIN IN D5W 750 MG/150ML IV SOLN
750.0000 mg | Freq: Once | INTRAVENOUS | Status: AC
  Administered 2013-03-31: 750 mg via INTRAVENOUS
  Filled 2013-03-31: qty 150

## 2013-03-31 MED ORDER — SODIUM CHLORIDE 0.9 % IJ SOLN
3.0000 mL | Freq: Two times a day (BID) | INTRAMUSCULAR | Status: DC
  Administered 2013-04-01 – 2013-04-03 (×7): 3 mL via INTRAVENOUS

## 2013-03-31 MED ORDER — PROPRANOLOL HCL 20 MG PO TABS
20.0000 mg | ORAL_TABLET | Freq: Two times a day (BID) | ORAL | Status: DC
  Administered 2013-03-31 – 2013-04-04 (×8): 20 mg via ORAL
  Filled 2013-03-31 (×9): qty 1

## 2013-03-31 MED ORDER — METHOCARBAMOL 500 MG PO TABS
500.0000 mg | ORAL_TABLET | Freq: Three times a day (TID) | ORAL | Status: DC | PRN
  Filled 2013-03-31: qty 1

## 2013-03-31 MED ORDER — SIMVASTATIN 40 MG PO TABS
40.0000 mg | ORAL_TABLET | Freq: Every day | ORAL | Status: DC
  Administered 2013-03-31 – 2013-04-03 (×4): 40 mg via ORAL
  Filled 2013-03-31 (×5): qty 1

## 2013-03-31 MED ORDER — INSULIN GLARGINE 100 UNIT/ML ~~LOC~~ SOLN
70.0000 [IU] | Freq: Two times a day (BID) | SUBCUTANEOUS | Status: DC
  Administered 2013-03-31 – 2013-04-04 (×8): 70 [IU] via SUBCUTANEOUS
  Filled 2013-03-31 (×9): qty 0.7

## 2013-03-31 MED ORDER — DULOXETINE HCL 60 MG PO CPEP
80.0000 mg | ORAL_CAPSULE | Freq: Every day | ORAL | Status: DC
  Administered 2013-04-01 – 2013-04-04 (×4): 80 mg via ORAL
  Filled 2013-03-31 (×4): qty 1

## 2013-03-31 MED ORDER — HYDROCODONE-ACETAMINOPHEN 5-325 MG PO TABS
1.0000 | ORAL_TABLET | Freq: Four times a day (QID) | ORAL | Status: DC | PRN
  Administered 2013-03-31 – 2013-04-02 (×5): 2 via ORAL
  Administered 2013-04-03 – 2013-04-04 (×2): 1 via ORAL
  Filled 2013-03-31: qty 1
  Filled 2013-03-31 (×6): qty 2

## 2013-03-31 MED ORDER — INSULIN ASPART 100 UNIT/ML ~~LOC~~ SOLN
0.0000 [IU] | Freq: Three times a day (TID) | SUBCUTANEOUS | Status: DC
  Administered 2013-04-01: 9 [IU] via SUBCUTANEOUS
  Administered 2013-04-01 – 2013-04-02 (×2): 3 [IU] via SUBCUTANEOUS
  Administered 2013-04-02: 2 [IU] via SUBCUTANEOUS
  Administered 2013-04-02: 1 [IU] via SUBCUTANEOUS
  Administered 2013-04-03: 5 [IU] via SUBCUTANEOUS
  Administered 2013-04-03: 3 [IU] via SUBCUTANEOUS
  Administered 2013-04-04: 5 [IU] via SUBCUTANEOUS

## 2013-03-31 NOTE — ED Notes (Signed)
Patient presents with SOB since June 10th especially upon exertion, patient on home oxygen, reporting chest "heaviness".  Patient speaking in short, complete sentences.

## 2013-03-31 NOTE — ED Notes (Signed)
Attempted report 

## 2013-03-31 NOTE — ED Provider Notes (Signed)
History     CSN: [id number]  Arrival date & time 03/31/13  1418   First MD Initiated Contact with Patient 03/31/13 1501      Chief Complaint  Patient presents with  . Shortness of Breath    (Consider location/radiation/quality/duration/timing/severity/associated sxs/prior treatment) The history is provided by the patient.  [name] Rice is a 57 y.o. female hx of COPD on home O2 here with SOB. Intermittent shortness of breath that's getting worse over the last several months. In the last 5 days she gets short of breath even walking to the bathroom and had increased oxygen requirement at home. Also some intermittent subjective fevers at home. Denies history CHF or CAD. Denies any history of PE or DVT or leg swelling.    Past Medical History  Diagnosis Date  . Anxiety   . Arthritis   . Asthma   . COPD (chronic obstructive pulmonary disease)   . Depression   . Diabetes mellitus   . GERD (gastroesophageal reflux disease)   . Hypertension   . HLD (hyperlipidemia)   . Dizziness   . History of colonic polyps   . Lumbar pain 2011    per x-ray - Multilevel spondylosis  . Cervical dystonia   . Tremor CCENTRAL NERVOUS SYSTEM  . Torn rotator cuff RT SHOULDER  . Dermatitis   . Diarrhea   . History of kidney stones   . Recurrent boils     "SLUSTER BOILS" ON ABD AND LABIA  . History of seborrheic dermatitis     face,ears  . OSA on CPAP 2008  . Sleep apnea     USES C-PAP  . Diverticulosis     Past Surgical History  Procedure Laterality Date  . Pilonidal cyst excision  4098,1191  . Tubal ligation    . Lumbar laminectomy    . Carpal tunnel release  2006    left  . Elbow surgery  LEFT  . Knee arthroscopy  LEFT  . Nasal sinus surgery    . Hand surgery  RIGHT  . Joint replacement      rt shoulder  . Mass excision  06/28/2012    Procedure: EXCISION MASS;  Surgeon: Shelly Rubenstein, MD;  Location: [facility] OR;  Service: General;  Laterality: Left;  excision left upper quadrant  abdominal wall mass, Benign Lipoma    Family History  Problem Relation Age of Onset  . Heart disease Brother   . Stroke Neg Hx   . Cancer Neg Hx     History  Substance Use Topics  . Smoking status: Former Games developer  . Smokeless tobacco: Former Neurosurgeon    Quit date: 07/08/1997  . Alcohol Use: No    OB History   Grav Para Term Preterm Abortions TAB SAB Ect Mult Living                  Review of Systems  Respiratory: Positive for shortness of breath.   All other systems reviewed and are negative.    Allergies  Ace inhibitors; Cefaclor; Shrimp; and Sulfonamide derivatives  Home Medications   Current Outpatient Rx  Name  Route  Sig  Dispense  Refill  . allopurinol (ZYLOPRIM) 300 MG tablet   Oral   Take 300 mg by mouth daily.         . DULoxetine (CYMBALTA) 20 MG capsule      Patient takes 20mg  in addition to 60 mg a day with total of 80 mg /day   90 capsule  1   . DULoxetine (CYMBALTA) 60 MG capsule   Oral   Take 1 capsule (60 mg total) by mouth daily.   90 capsule   1   . Fluticasone-Salmeterol (ADVAIR DISKUS) 250-50 MCG/DOSE AEPB   Inhalation   Inhale 1 puff into the lungs 2 (two) times daily.   1 each   5   . furosemide (LASIX) 40 MG tablet   Oral   Take 40 mg by mouth daily.         Marland Kitchen gabapentin (NEURONTIN) 300 MG capsule   Oral   Take 300 mg by mouth 3 (three) times daily.         Marland Kitchen HYDROcodone-acetaminophen (NORCO/VICODIN) 5-325 MG per tablet   Oral   Take 1-2 tablets by mouth 2 (two) times daily as needed for pain.          Marland Kitchen insulin aspart (NOVOLOG) 100 UNIT/ML injection   Subcutaneous   Inject 60 Units into the skin 3 (three) times daily before meals.          . insulin glargine (LANTUS) 100 UNIT/ML injection   Subcutaneous   Inject 70 Units into the skin 2 (two) times daily.         . meclizine (ANTIVERT) 25 MG tablet   Oral   Take 1 tablet (25 mg total) by mouth 3 (three) times daily as needed for dizziness.   30 tablet    0   . metFORMIN (GLUCOPHAGE) 1000 MG tablet   Oral   Take 1 tablet (1,000 mg total) by mouth 2 (two) times daily with a meal.   180 tablet   3   . methocarbamol (ROBAXIN) 500 MG tablet   Oral   Take 500 mg by mouth 3 (three) times daily as needed (for muscle spasms).         . mometasone (NASONEX) 50 MCG/ACT nasal spray   Nasal   Place 2 sprays into the nose daily as needed (for nasal inflammation).         . montelukast (SINGULAIR) 10 MG tablet   Oral   Take 10 mg by mouth at bedtime.         Marland Kitchen neomycin-polymyxin-hydrocortisone (CORTISPORIN) otic solution   Both Ears   Place 3 drops into both ears 3 (three) times daily as needed (for psoriasis in ear, only as needed).         Marland Kitchen olopatadine (PATANOL) 0.1 % ophthalmic solution   Both Eyes   Place 1 drop into both eyes 2 (two) times daily as needed. Itching eyes   5 mL   2   . pantoprazole (PROTONIX) 40 MG tablet   Oral   Take 1 tablet (40 mg total) by mouth daily.   90 tablet   3   . pravastatin (PRAVACHOL) 40 MG tablet   Oral   Take 1 tablet (40 mg total) by mouth daily.   90 tablet   3   . propranolol (INDERAL) 20 MG tablet   Oral   Take 20 mg by mouth 2 (two) times daily.         Marland Kitchen triamcinolone cream (KENALOG) 0.5 %   Topical   Apply 1 application topically 3 (three) times daily as needed (for facial psoriasis).         . Vitamin D, Ergocalciferol, (DRISDOL) 50000 UNITS CAPS   Oral   Take 50,000 Units by mouth every 7 (seven) days. On tuesdays         .  albuterol (VENTOLIN HFA) 108 (90 BASE) MCG/ACT inhaler   Inhalation   Inhale 2 puffs into the lungs every 6 (six) hours as needed for shortness of breath. Wheezing            BP 160/83  Pulse 119  Temp(Src) 98.3 F (36.8 C) (Oral)  Resp 16  SpO2 96%  LMP 10/18/1995  Physical Exam  Nursing note and vitals reviewed. Constitutional: She is oriented to person, place, and time.  Slightly tachypneic, talking in full sentences    HENT:  Head: Normocephalic.  Mouth/Throat: Oropharynx is clear and moist.  Eyes: Conjunctivae are normal. Pupils are equal, round, and reactive to light.  Neck: Normal range of motion. Neck supple.  Cardiovascular: Regular rhythm and normal heart sounds.   Tachycardic   Pulmonary/Chest:  Increased effort, diminished breath sounds throughout, minimal wheezing. No crackles   Abdominal: Soft. Bowel sounds are normal. She exhibits no distension. There is no tenderness.  Obese, nontender   Musculoskeletal: Normal range of motion. She exhibits no edema and no tenderness.  Neurological: She is alert and oriented to person, place, and time.  Skin: Skin is warm and dry.  Psychiatric: She has a normal mood and affect. Her behavior is normal. Judgment and thought content normal.    ED Course  Procedures (including critical care time)  Labs Reviewed  BASIC METABOLIC PANEL - Abnormal; Notable for the following:    Glucose, Bld 240 (*)    GFR calc non Af Amer 72 (*)    GFR calc Af Amer 83 (*)    All other components within normal limits  GLUCOSE, CAPILLARY - Abnormal; Notable for the following:    Glucose-Capillary 154 (*)    All other components within normal limits  CG4 I-STAT (LACTIC ACID) - Abnormal; Notable for the following:    Lactic Acid, Venous 4.77 (*)    All other components within normal limits  CBC  PRO B NATRIURETIC PEPTIDE  URINALYSIS, ROUTINE W REFLEX MICROSCOPIC  POCT I-STAT TROPONIN I   Dg Chest 2 View  03/31/2013   *RADIOLOGY REPORT*  Clinical Data: Shortness of breath  CHEST - 2 VIEW  Comparison: 10/21/2011  Findings: Cardiomediastinal silhouette is stable.  Mild hyperinflation.  No acute infiltrate or pleural effusion.  No pulmonary edema.  Stable degenerative changes thoracic spine.  IMPRESSION: No active disease.  No significant change.   Original Report Authenticated By: Natasha Mead, M.D.     No diagnosis found.   Date: 03/31/2013  Rate: 118  Rhythm: sinus  tachycardia  QRS Axis: normal  Intervals: normal  ST/T Wave abnormalities: nonspecific ST changes  Conduction Disutrbances:none  Narrative Interpretation:   Old EKG Reviewed: unchanged    MDM  [name] Rice is a 57 y.o. female here with SOB. Likely COPD exacerbation. Will give steroids, nebs and reassess.   5 PM Patient's CXR showed no infiltrate. However, lactate elevated. Still wheezing after steroids and 2 nebs. Will admit to internal medicine. I gave levaquin empirically for elevated lactate and COPD exacerbation even though there is no pneumonia on CXR.          Richardean Canal, MD 03/31/13 1740

## 2013-03-31 NOTE — ED Notes (Signed)
Spoke to lab will add on BMP

## 2013-03-31 NOTE — ED Notes (Signed)
CBG checked 224

## 2013-03-31 NOTE — H&P (Signed)
Hospital Admission Note Date: 03/31/2013  Patient name: [name] Rice Medical record number: [id number] Date of birth: 1956-07-24 Age: 57 y.o. Gender: female PCP: Elyse Jarvis, MD   Service:  Internal Medicine Teaching Service   Chief Complaint:  Dyspnea with exertion    History of Present Illness:  57 year old woman with morbid obesity, COPD, hypertension, hyperlipidemia, and former tobacco smoker; presenting to the emergency department with several days of dyspnea with exertion. Onset was 5 days ago. Patient was in her normal state of health 6 days ago. No provoking incident identified by the patient. She describes dyspnea with even a minimal amount of exertion; standing up makes her short of breath. She does not report cough or sputum production. She denies orthopnea and paroxysmal nocturnal dyspnea. She denies having chest pain now or in the past. She denies palpitations and worsening edema. She does report more frequent headaches but denies dizziness. No changes in her vision or hearing.     Review of Systems:   Constitutional: Positive for fever (Tm 99.5), chills and malaise/fatigue.  HENT: Negative for hearing loss, congestion, sore throat and tinnitus.   Eyes: Negative for blurred vision and double vision.  Respiratory: Positive for shortness of breath. Negative for cough and sputum production.   Cardiovascular: Negative for chest pain, palpitations, orthopnea, leg swelling and PND.  Gastrointestinal: Positive for heartburn and diarrhea. Negative for nausea, vomiting, abdominal pain, constipation and blood in stool.  Genitourinary: Negative for dysuria and hematuria.  Musculoskeletal: Positive for back pain (chronic). Negative for joint pain.  Skin: Positive for rash (chronic).  Neurological: Positive for weakness and headaches. Negative for dizziness, sensory change and focal weakness.     Medical History: Past Medical History  Diagnosis Date  . Anxiety   . Arthritis   .  Asthma     no PFTs  . COPD (chronic obstructive pulmonary disease)     no PFTs  . Depression   . Diabetes mellitus   . GERD (gastroesophageal reflux disease)   . Hypertension   . HLD (hyperlipidemia)   . Dizziness   . History of colonic polyps   . Lumbar pain 2011    per x-ray - Multilevel spondylosis  . Cervical dystonia   . Tremor CCENTRAL NERVOUS SYSTEM  . Torn rotator cuff RT SHOULDER  . Diarrhea   . History of kidney stones   . Recurrent boils     "SLUSTER BOILS" ON ABD AND LABIA  . History of seborrheic dermatitis     face,ears  . OSA on CPAP 2008  . Sleep apnea     USES C-PAP  . Diverticulosis     Surgical History: Past Surgical History  Procedure Laterality Date  . Pilonidal cyst excision  [id number]  . Tubal ligation    . Lumbar laminectomy    . Carpal tunnel release  2006    left  . Elbow surgery  LEFT  . Knee arthroscopy  LEFT  . Nasal sinus surgery    . Hand surgery  RIGHT  . Joint replacement      rt shoulder  . Mass excision  06/28/2012    Procedure: EXCISION MASS;  Surgeon: Shelly Rubenstein, MD;  Location: [facility] OR;  Service: General;  Laterality: Left;  excision left upper quadrant abdominal wall mass, Benign Lipoma    Home Medications: 1. Albuterol MDI 2 puffs every 6 hours when necessary shortness of breath 2. Allopurinol 300 mg daily 3. Duloxetine 80 mg a day 4. Fluticasone-salmeterol 250-50 one puff  twice a day 5. Furosemide 40 mg daily 6. Gabapentin 300 mg 3 times a day 7. Hydrocodone-acetaminophen 5-325 one to 2 tablets twice a day 8. Insulin aspart 60 units 3 times a day before meals 9. Insulin glargine 70 units twice a day 10. Meclizine 25 mg 3 times a day when necessary for dizziness 11. Metformin 1000 mg twice a day 12. Methocarbamol 500 mg 3 times a day when necessary for muscle spasms 13. Mometasone intranasal 2 sprays daily as needed for congestion 14. Montelukast 10 mg at bedtime 15. Olopatadine 0.1% ophthalmic 1 drop both eyes  twice a day as needed for dry eyes 16. Pantoprazole 40 mg daily 17. Pravastatin 40 mg daily 18. Propranolol 20 mg twice a day 19. Triamcinolone cream 0.5% 3 times a day for seborrheic dermatitis of the face 20.. Calcitrol 50,000 units weekly on Tuesday   Allergies: Allergies as of 03/31/2013 - Review Complete 03/31/2013  Allergen Reaction Noted  . Ace inhibitors Other (See Comments)   . Cefaclor Hives and Itching   . Shrimp (shellfish allergy) Nausea And Vomiting 10/12/2011  . Sulfonamide derivatives Rash     Family History: Family History  Problem Relation Age of Onset  . Heart disease Brother   . Stroke Neg Hx   . Cancer Neg Hx     Social History: Social History  . Marital Status: Widowed    Spouse Name: N/A    Number of Children: N/A  . Years of Education: N/A   Social History Main Topics  . Smoking status: Former Games developer  . Smokeless tobacco: No    Quit date: 07/08/1997  . Alcohol Use: No  . Drug Use: No  . Sexually Active: Not Currently   Social History Narrative   Lives alone in [place].      Physical exam:  VITALS: BP 153/82, HR 119, RR 15, temp 98.42F, SpO2 95% on 2 L [place] GENERAL: obses; no acute distress HEAD: atraumatic, normocephalic EYES: pupils equal, round and reactive; sclera anicteric; normal conjunctiva EARS: canals patent and TMs normal bilaterally NOSE/THROAT: oropharynx clear, moist mucous membranes, pink gums, normal dentition NECK: supple, thyroid normal in size and without palpable nodules LYMPH: no cervical or supraclavicular lymphadenopathy LUNGS: clear to auscultation bilaterally, normal work of breathing HEART: normal rate and regular rhythm; normal S1 and S2 without S3 or S4; no murmurs, rubs, or clicks PULSES: radial and dorsalis pedis pulses are 2+ and symmetric ABDOMEN: soft, normal bowel sounds, no masses, tenderness with palpation of the left flank (a chronic problem according to the patient) SKIN: warm, dry, intact, normal turgor,  scaling dermatitis of the eyebrows consistent with seborrhea EXTREMITIES: no peripheral edema, clubbing, or cyanosis PSYCH: patient is alert and oriented, mood and affect are normal and congruent, thought content is normal without delusions, thought process is linear, speech is normal and non-pressured, behavior is normal      Lab results: Basic Metabolic Panel:  19/14/78 1509  NA 138  K 4.1  CL 100  CO2 25  GLUCOSE 240*  BUN 12  CREATININE 0.87  CALCIUM 9.6  AG 13*   CBC:  03/31/13 1435  WBC 6.9  HGB 13.1  HCT 39.5  MCV 84.8  PLT 171   Cardiac Enzymes:  03/31/13 1435  POCTROP 0.03   BNP:  03/31/13 1435  PROBNP 120.8   CBG:  03/31/13 1701  GLUC 154*   Lactic Acid:   03/31/13 1701  LACTATE 4.77*   Imaging results: Dg Chest 2 View 03/31/2013  FINDINGS: Cardiomediastinal silhouette is stable.  Mild hyperinflation.  No acute infiltrate or pleural effusion.  No pulmonary edema.  Stable degenerative changes thoracic spine.   IMPRESSION: No active disease.  No significant change.     Other results: EKG Results:  03/31/2013 Rate:  118 PR:  140 QRS:  76 QTc:  445 Axis:  Normal Comparison: 10/21/2011 EKG: Sinus tachycardia, new Q waves in all inferior leads compared to prior tracing.   Assessment and Plan:  1.   Dyspnea with exertion:  Cardiogenic cause is most likely given the changes on EKG (new Q waves). Though this is not a heart failure exacerbation as supported by a normal chest x-ray, normal physical exam, and normal proBNP. COPD exacerbation is unlikely since she is not suffering from cough or sputum production. - 2-D echocardiogram with contrast  2.   Lactic acidosis:  No evidence of infection or sepsis. Hemolysis of the sample, liver dysfunction, and metformin are potential causes. - Repeat lactic acid - LFTs - Hold metformin - AM BMET  3.   COPD:  This does not seem to be an acute exacerbation. Her lungs are clear on exam. She is not  complaining of cough or sputum production. Even if this is exacerbation, it is mild in nature and should not require antibiotics. We will provide her with nebulized treatments of albuterol and ipratropium. We will hold off on additional antibiotics and corticosteroids. She received a dose of levofloxacin in a dose of methylprednisolone in the emergency department. - Nebulized albuterol and ipratropium every 6 hours when necessary  4.   Hypertension:  Her home regimen includes furosemide 40 mg a day. She is also on propranolol 20 mg twice a day for tremors. We will not increase propranolol (non-selective beta-antagonist) in the setting of COPD and possible asthma. We will continue her home regimen and consider adding ACE-i or ARB tomorrow. - Continue furosemide PO 40 mg daily - Continue propranolol 20 mg twice a day  5.   Stage II CKD:  Stable. Baseline creatinine is around 0.90 with an estimated GFR of 70. At admission, her creatinine was 0.87 with an estimated GFR of 72.  6.   Type II diabetes mellitus:  Hemoglobin A1c was 7.8 on 02/21/2013. Her home regimen includes insulin aspart 60 units 3 times a day before meals, insulin glargine 70 units twice a day, and metformin 1000 mg twice a day. A urine microalbumin-creatinine ratio was normal in 2012. - Continue insulin glargine 70U BID - Decrease meal coverage insulin aspart to 40U TID - Start sensitive scale correctional insulin aspart qAC/qHS - Hold metformin, as noted above  7.   Prophylaxis: heparin 5,000 units [place] TID  8.   Disposition:  OPC patient.  Will need follow up with [place].    Signed by:  Dorthula Rue. Earlene Plater, MD PGY-I, Internal Medicine  03/31/2013, 6:58 PM

## 2013-04-01 DIAGNOSIS — J441 Chronic obstructive pulmonary disease with (acute) exacerbation: Principal | ICD-10-CM

## 2013-04-01 DIAGNOSIS — G4733 Obstructive sleep apnea (adult) (pediatric): Secondary | ICD-10-CM

## 2013-04-01 DIAGNOSIS — E1149 Type 2 diabetes mellitus with other diabetic neurological complication: Secondary | ICD-10-CM

## 2013-04-01 DIAGNOSIS — I10 Essential (primary) hypertension: Secondary | ICD-10-CM

## 2013-04-01 LAB — BASIC METABOLIC PANEL
CO2: 22 mEq/L (ref 19–32)
Calcium: 9 mg/dL (ref 8.4–10.5)
Chloride: 98 mEq/L (ref 96–112)
Creatinine, Ser: 0.79 mg/dL (ref 0.50–1.10)
GFR calc Af Amer: >90 mL/min (ref >90)
Sodium: 132 mEq/L — ABNORMAL LOW (ref 135–145)

## 2013-04-01 LAB — URINALYSIS, ROUTINE W REFLEX MICROSCOPIC
Bilirubin Urine: NEGATIVE
Glucose, UA: >1000 mg/dL — AB
Hgb urine dipstick: NEGATIVE
Nitrite: NEGATIVE
Protein, ur: NEGATIVE mg/dL
Urobilinogen, UA: 0.2 mg/dL (ref 0.0–1.0)

## 2013-04-01 LAB — TROPONIN I: Troponin I: <0.3 ng/mL (ref <0.30)

## 2013-04-01 LAB — URINE MICROSCOPIC-ADD ON

## 2013-04-01 LAB — GLUCOSE, CAPILLARY
Glucose-Capillary: 380 mg/dL — ABNORMAL HIGH (ref 70–99)
Glucose-Capillary: 216 mg/dL — ABNORMAL HIGH (ref 70–99)

## 2013-04-01 LAB — LACTATE DEHYDROGENASE: LDH: 198 U/L (ref 94–250)

## 2013-04-01 MED ORDER — DOXYCYCLINE HYCLATE 100 MG PO TABS
100.0000 mg | ORAL_TABLET | Freq: Two times a day (BID) | ORAL | Status: DC
  Administered 2013-04-01 – 2013-04-04 (×6): 100 mg via ORAL
  Filled 2013-04-01 (×7): qty 1

## 2013-04-01 MED ORDER — GABAPENTIN 300 MG PO CAPS
300.0000 mg | ORAL_CAPSULE | Freq: Once | ORAL | Status: AC
  Administered 2013-04-01: 300 mg via ORAL
  Filled 2013-04-01: qty 1

## 2013-04-01 MED ORDER — DIPHENHYDRAMINE-ZINC ACETATE 2-0.1 % EX CREA
TOPICAL_CREAM | Freq: Three times a day (TID) | CUTANEOUS | Status: DC | PRN
  Administered 2013-04-01: 22:00:00 via TOPICAL
  Filled 2013-04-01: qty 28

## 2013-04-01 MED ORDER — GABAPENTIN 600 MG PO TABS
300.0000 mg | ORAL_TABLET | Freq: Once | ORAL | Status: DC
  Filled 2013-04-01: qty 0.5

## 2013-04-01 MED ORDER — INSULIN ASPART 100 UNIT/ML ~~LOC~~ SOLN
50.0000 [IU] | Freq: Three times a day (TID) | SUBCUTANEOUS | Status: DC
  Administered 2013-04-02: 50 [IU] via SUBCUTANEOUS

## 2013-04-01 NOTE — H&P (Signed)
Date: 04/01/2013  Patient name: [name] Rice  Medical record number: [id number]  Date of birth: 24-Jun-1956   I have seen and evaluated [name] Rice and discussed their care with the Residency Team.  57 yr. Old WF w/ COPD, HL, obesity, OSA, HTN, presented with DOE.  She states she has been having some fever recently and is now having some productive cough.  She denies CP, PND, orthopnea.  Denies urinary symptoms. Initial VS with HR 124, temp of 98.3 F, BP 162/79, O2 sat 95% on 2 L [place].  She was given a dose of Levaquin in the ED, 750 mg IV.  Initial WBC 6.9, proBNP 120, Trop I 0.03. EKG with Q waves in inferior leads, but no ST changes.  CXR without infiltrates or edema.  LA noted to be 4.77, decreased to 4.0 this morning. UA with evidence of ketones and large glucose, no nitrites or LE.  She does not feel that much different today, but admits to some productive sputum this morning.  Physical Exam: Blood pressure 174/80, pulse 83, temperature 98.1 F (36.7 C), temperature source Oral, resp. rate 24, height 5' 3.5" (1.613 m), weight 263 lb 3.7 oz (119.4 kg), last menstrual period 10/18/1995, SpO2 99.00%. BP 174/80  Pulse 83  Temp(Src) 98.1 F (36.7 C) (Oral)  Resp 24  Ht 5' 3.5" (1.613 m)  Wt 263 lb 3.7 oz (119.4 kg)  BMI 45.89 kg/m2  SpO2 99%  LMP 10/18/1995 General appearance: alert, cooperative and appears stated age Head: Normocephalic, without obvious abnormality, atraumatic Neck: no adenopathy, no carotid bruit, no JVD, supple, symmetrical, trachea midline and thyroid not enlarged, symmetric, no tenderness/mass/nodules Lungs: diminished breath sounds bilaterally Heart: S1, S2 normal and tachycardic. No JVD Abdomen: soft, non-tender; bowel sounds normal; no masses,  no organomegaly Extremities: extremities normal, atraumatic, no cyanosis or edema and no edema, redness or tenderness in the calves or thighs Pulses: 2+ and symmetric Neurologic: Alert and oriented X 3, normal strength  and tone. Normal symmetric reflexes. Normal coordination and gait  Lab results: Results for orders placed during the hospital encounter of 03/31/13 (from the past 24 hour(s))  CBC     Status: None   Collection Time    03/31/13  2:35 PM      Result Value Range   WBC 6.9  4.0 - 10.5 K/uL   RBC 4.66  3.87 - 5.11 MIL/uL   Hemoglobin 13.1  12.0 - 15.0 g/dL   HCT 40.9  81.1 - 91.4 %   MCV 84.8  78.0 - 100.0 fL   MCH 28.1  26.0 - 34.0 pg   MCHC 33.2  30.0 - 36.0 g/dL   RDW 78.2  95.6 - 21.3 %   Platelets 171  150 - 400 K/uL  PRO B NATRIURETIC PEPTIDE     Status: None   Collection Time    03/31/13  2:35 PM      Result Value Range   Pro B Natriuretic peptide (BNP) 120.8  0 - 125 pg/mL  POCT I-STAT TROPONIN I     Status: None   Collection Time    03/31/13  2:58 PM      Result Value Range   Troponin i, poc 0.03  0.00 - 0.08 ng/mL   Comment 3           BASIC METABOLIC PANEL     Status: Abnormal   Collection Time    03/31/13  3:09 PM      Result Value  Range   Sodium 138  135 - 145 mEq/L   Potassium 4.1  3.5 - 5.1 mEq/L   Chloride 100  96 - 112 mEq/L   CO2 25  19 - 32 mEq/L   Glucose, Bld 240 (*) 70 - 99 mg/dL   BUN 12  6 - 23 mg/dL   Creatinine, Ser 1.19  0.50 - 1.10 mg/dL   Calcium 9.6  8.4 - 14.7 mg/dL   GFR calc non Af Amer 72 (*) >90 mL/min   GFR calc Af Amer 83 (*) >90 mL/min  CG4 I-STAT (LACTIC ACID)     Status: Abnormal   Collection Time    03/31/13  4:37 PM      Result Value Range   Lactic Acid, Venous 4.77 (*) 0.5 - 2.2 mmol/L  GLUCOSE, CAPILLARY     Status: Abnormal   Collection Time    03/31/13  5:01 PM      Result Value Range   Glucose-Capillary 154 (*) 70 - 99 mg/dL  LACTIC ACID, PLASMA     Status: Abnormal   Collection Time    03/31/13  6:59 PM      Result Value Range   Lactic Acid, Venous 6.6 (*) 0.5 - 2.2 mmol/L  GLUCOSE, CAPILLARY     Status: Abnormal   Collection Time    03/31/13  7:28 PM      Result Value Range   Glucose-Capillary 224 (*) 70 - 99  mg/dL  GLUCOSE, CAPILLARY     Status: Abnormal   Collection Time    03/31/13  8:54 PM      Result Value Range   Glucose-Capillary 326 (*) 70 - 99 mg/dL  HEPATIC FUNCTION PANEL     Status: Abnormal   Collection Time    03/31/13  9:15 PM      Result Value Range   Total Protein 7.2  6.0 - 8.3 g/dL   Albumin 3.6  3.5 - 5.2 g/dL   AST 32  0 - 37 U/L   ALT 36 (*) 0 - 35 U/L   Alkaline Phosphatase 96  39 - 117 U/L   Total Bilirubin 0.3  0.3 - 1.2 mg/dL   Bilirubin, Direct <8.2  0.0 - 0.3 mg/dL   Indirect Bilirubin NOT CALCULATED  0.3 - 0.9 mg/dL  URINALYSIS, ROUTINE W REFLEX MICROSCOPIC     Status: Abnormal   Collection Time    04/01/13  3:47 AM      Result Value Range   Color, Urine YELLOW  YELLOW   APPearance CLEAR  CLEAR   Specific Gravity, Urine 1.028  1.005 - 1.030   pH 5.0  5.0 - 8.0   Glucose, UA >1000 (*) NEGATIVE mg/dL   Hgb urine dipstick NEGATIVE  NEGATIVE   Bilirubin Urine NEGATIVE  NEGATIVE   Ketones, ur 15 (*) NEGATIVE mg/dL   Protein, ur NEGATIVE  NEGATIVE mg/dL   Urobilinogen, UA 0.2  0.0 - 1.0 mg/dL   Nitrite NEGATIVE  NEGATIVE   Leukocytes, UA NEGATIVE  NEGATIVE  URINE MICROSCOPIC-ADD ON     Status: Abnormal   Collection Time    04/01/13  3:47 AM      Result Value Range   Squamous Epithelial / LPF FEW (*) RARE   WBC, UA 0-2  <3 WBC/hpf   RBC / HPF 0-2  <3 RBC/hpf   Bacteria, UA FEW (*) RARE  BASIC METABOLIC PANEL     Status: Abnormal   Collection Time    04/01/13  5:05 AM      Result Value Range   Sodium 132 (*) 135 - 145 mEq/L   Potassium 5.0  3.5 - 5.1 mEq/L   Chloride 98  96 - 112 mEq/L   CO2 22  19 - 32 mEq/L   Glucose, Bld 384 (*) 70 - 99 mg/dL   BUN 15  6 - 23 mg/dL   Creatinine, Ser 9.60  0.50 - 1.10 mg/dL   Calcium 9.0  8.4 - 45.4 mg/dL   GFR calc non Af Amer 89 (*) >90 mL/min   GFR calc Af Amer >90  >90 mL/min  GLUCOSE, CAPILLARY     Status: Abnormal   Collection Time    04/01/13  6:06 AM      Result Value Range   Glucose-Capillary 380  (*) 70 - 99 mg/dL  TROPONIN I     Status: None   Collection Time    04/01/13  8:00 AM      Result Value Range   Troponin I <0.30  <0.30 ng/mL  LACTATE DEHYDROGENASE     Status: None   Collection Time    04/01/13  8:00 AM      Result Value Range   LDH 198  94 - 250 U/L  LACTIC ACID, PLASMA     Status: Abnormal   Collection Time    04/01/13  9:07 AM      Result Value Range   Lactic Acid, Venous 4.0 (*) 0.5 - 2.2 mmol/L  GLUCOSE, CAPILLARY     Status: Abnormal   Collection Time    04/01/13 10:59 AM      Result Value Range   Glucose-Capillary 216 (*) 70 - 99 mg/dL    Imaging results:  Dg Chest 2 View  03/31/2013   *RADIOLOGY REPORT*  Clinical Data: Shortness of breath  CHEST - 2 VIEW  Comparison: 10/21/2011  Findings: Cardiomediastinal silhouette is stable.  Mild hyperinflation.  No acute infiltrate or pleural effusion.  No pulmonary edema.  Stable degenerative changes thoracic spine.  IMPRESSION: No active disease.  No significant change.   Original Report Authenticated By: Natasha Mead, M.D.    Assessment and Plan: I have seen and evaluated the patient as outlined above. I agree with the formulated Assessment and Plan as detailed in the residents' admission note, with the following changes:  -Send BC at this time. Given her symptoms and hx COPD, I would go ahead and treat her as a COPD exacerbation. I would treat her with Levaquin PO for now and oral prednisone. Lactic acid is elevated. Repeat in AM, watch clinically. Would obtain ABG at this time. Duoneb tx. -Agree with TTE due to q waves, no evidence of ACS. -She needs better glycemic control. -rest per resident note.  Jonah Blue, DO 6/16/201411:44 AM

## 2013-04-01 NOTE — Progress Notes (Signed)
Subjective: Patient states that she is doing OK this morning. She did start having a cough last night around 10pm, nonproductive, but feels she has some congestion in her chest  BS elevated this morning to 300's. She states that she feels about the same as yesterday, no improvement in her SOB but she has not been out of bed too much.  Denies chest pain.   Objective: Vital signs in last 24 hours: Filed Vitals:   03/31/13 2000 03/31/13 2057 03/31/13 2347 04/01/13 0510  BP: 151/74 160/74  174/80  Pulse: 119 120 118 83  Temp:  98.6 F (37 C)  98.1 F (36.7 C)  TempSrc:  Oral  Oral  Resp: 21 20 22 24   Height:  5' 3.5" (1.613 m)    Weight:  264 lb 8.8 oz (120 kg)  263 lb 3.7 oz (119.4 kg)  SpO2: 96% 100%  99%   Weight change:   Intake/Output Summary (Last 24 hours) at 04/01/13 1335 Last data filed at 04/01/13 1142  Gross per 24 hour  Intake    240 ml  Output   2100 ml  Net  -1860 ml    Physical Exam Blood pressure 174/80, pulse 83, temperature 98.1 F (36.7 C), temperature source Oral, resp. rate 24, height 5' 3.5" (1.613 m), weight 263 lb 3.7 oz (119.4 kg), last menstrual period 10/18/1995, SpO2 99.00%. General:  No acute distress, alert and oriented x 3, appears ill but no acute distress HEENT:  PERRL, EOMI, oropharynx clear Cardiovascular:  Distant heart sounds Respiratory:  Clear to auscultation bilaterally, slight rales at bases, no wheezing Abdomen:  Obese, soft, nondistended, nontender, bowel sounds present Extremities:  Warm and well-perfused, trace edema.  Skin: Warm, dry, no rashes Neuro: Not anxious appearing, no depressed mood, normal affect  Lab Results: Basic Metabolic Panel:  Recent Labs Lab 03/31/13 1509 04/01/13 0505  NA 138 132*  K 4.1 5.0  CL 100 98  CO2 25 22  GLUCOSE 240* 384*  BUN 12 15  CREATININE 0.87 0.79  CALCIUM 9.6 9.0   Liver Function Tests:  Recent Labs Lab 03/31/13 2115  AST 32  ALT 36*  ALKPHOS 96  BILITOT 0.3  PROT 7.2   ALBUMIN 3.6   CBC:  Recent Labs Lab 03/31/13 1435  WBC 6.9  HGB 13.1  HCT 39.5  MCV 84.8  PLT 171   Cardiac Enzymes:  Recent Labs Lab 04/01/13 0800  TROPONINI <0.30   BNP:  Recent Labs Lab 03/31/13 1435  PROBNP 120.8   D-Dimer: No results found for this basename: DDIMER,  in the last 168 hours CBG:  Recent Labs Lab 03/31/13 1701 03/31/13 1928 03/31/13 2054 04/01/13 0606 04/01/13 1059  GLUCAP 154* 224* 326* 380* 216*   Urine Drug Screen: Drugs of Abuse     Component Value Date/Time   LABOPIA NEG 08/08/2011 1600   COCAINSCRNUR NEG 08/08/2011 1600   LABBENZ NEG 08/08/2011 1600   AMPHETMU NEG 08/08/2011 1600    Urinalysis:  Recent Labs Lab 04/01/13 0347  COLORURINE YELLOW  LABSPEC 1.028  PHURINE 5.0  GLUCOSEU >1000*  HGBUR NEGATIVE  BILIRUBINUR NEGATIVE  KETONESUR 15*  PROTEINUR NEGATIVE  UROBILINOGEN 0.2  NITRITE NEGATIVE  LEUKOCYTESUR NEGATIVE     Micro Results: No results found for this or any previous visit (from the past 240 hour(s)). Studies/Results: Dg Chest 2 View  03/31/2013   *RADIOLOGY REPORT*  Clinical Data: Shortness of breath  CHEST - 2 VIEW  Comparison: 10/21/2011  Findings: Cardiomediastinal silhouette  is stable.  Mild hyperinflation.  No acute infiltrate or pleural effusion.  No pulmonary edema.  Stable degenerative changes thoracic spine.  IMPRESSION: No active disease.  No significant change.   Original Report Authenticated By: Natasha Mead, M.D.   Medications:  Medications reviewed  Scheduled Meds: . allopurinol  300 mg Oral Daily  . DULoxetine  80 mg Oral Daily  . furosemide  40 mg Oral Daily  . gabapentin  300 mg Oral TID  . heparin  5,000 Units Subcutaneous Q8H  . insulin aspart  0-5 Units Subcutaneous QHS  . insulin aspart  0-9 Units Subcutaneous TID WC  . insulin aspart  50 Units Subcutaneous TID WC  . insulin glargine  70 Units Subcutaneous BID  . montelukast  10 mg Oral QHS  . pantoprazole  40 mg Oral Daily   . propranolol  20 mg Oral BID  . simvastatin  40 mg Oral QHS  . sodium chloride  3 mL Intravenous Q12H  . [START ON 04/02/2013] Vitamin D (Ergocalciferol)  50,000 Units Oral Q7 days   Continuous Infusions:  PRN Meds:.sodium chloride, albuterol, fluticasone, HYDROcodone-acetaminophen, ipratropium, meclizine, methocarbamol, olopatadine, sodium chloride  Assessment/Plan:  1. Dyspnea with exertion: Concern for cardiogenic causes given the changes on EKG (new Q waves). Likely not a heart failure exacerbation as supported by a normal chest x-ray, normal physical exam, and normal proBNP. COPD exacerbation is unlikely since she is not suffering from cough or sputum production and no wheezing on exam. LDH normal.  -waiting for ECHO -consider doing CT Angio today to rule out PE   2. Lactic acidosis: Unclear etiology at this time. No evidence of infection or sepsis. Hemolysis of the sample, liver dysfunction, and metformin are potential causes, though renal function is WNL. AST 32, ALT 36. 6/16: lactic acid trending down, pt not septic appearing, no acute distress, no hypotension - Hold metformin    3. COPD: This does not seem to be an acute exacerbation, but she did develop cough yesterday. Her lungs are clear on exam. She is not complaining of cough or sputum production. We will hold off on additional antibiotics and corticosteroids. She received a dose of levofloxacin in a dose of methylprednisolone in the emergency department.  - Nebulized albuterol and ipratropium every 6 hours PRN -cont home singulair  4. Hypertension: Her home regimen includes furosemide 40 mg a day. She is also on propranolol 20 mg twice a day for tremors. We will not increase propranolol (non-selective beta-antagonist) in the setting of COPD and possible asthma. We will continue her home regimen for now - Continue furosemide PO 40 mg daily  - Continue propranolol 20 mg twice a day   5. Stage II CKD: Stable. Baseline  creatinine is around 0.90 with an estimated GFR of 70. At admission, her creatinine was 0.87 with an estimated GFR of 72.   6. 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 Her home regimen includes insulin aspart 60 units 3 times a day before meals, insulin glargine 70 units twice a day, and metformin 1000 mg twice a day. A urine microalbumin-creatinine ratio was normal in 2012.  CBGs elevated this morning  - Continue insulin glargine 70U BID  - Increase meal coverage insulin aspart to 50U TID  - sensitive scale correctional insulin aspart qAC/qHS  - Hold metformin, as noted above  -cont gabapentin  Chronic Sciatica and Back pain Pt complaining of her chronic leg and back pain today -  patient on home norco, which we continued -encourage patient to ask for PRN meds  Prophylaxis: heparin 5,000 units [place] TID  Disposition: OPC patient. Will need follow up in clinic    LOS: 1 day   [place] Ar 04/01/2013, 1:35 PM

## 2013-04-01 NOTE — Progress Notes (Signed)
Discussed current VS with MD, no new orders received.  NAD, will continue to monitor.

## 2013-04-01 NOTE — Progress Notes (Signed)
Pt spoke with RN and said she would not want ot use cpap tonight because it makes too much noise.  Rt will consult.

## 2013-04-01 NOTE — Progress Notes (Signed)
Pt blood  Glucose 101 at 1630. Order for meal coverage 50 units. Pt does not feel comfortable receiving 50 units of Novolog and did not eat 50% of meal. Diabetes coordinator paged for recommendations. Coordinator feels this is to high of a dose for patient at this time. MD paged. 50 units not given and charted in Epic. [name] Rice

## 2013-04-02 ENCOUNTER — Inpatient Hospital Stay (HOSPITAL_COMMUNITY): Payer: Medicare Other

## 2013-04-02 DIAGNOSIS — I517 Cardiomegaly: Secondary | ICD-10-CM

## 2013-04-02 DIAGNOSIS — E872 Acidosis, unspecified: Secondary | ICD-10-CM

## 2013-04-02 DIAGNOSIS — I5031 Acute diastolic (congestive) heart failure: Secondary | ICD-10-CM

## 2013-04-02 LAB — BASIC METABOLIC PANEL
CO2: 27 mEq/L (ref 19–32)
Chloride: 98 mEq/L (ref 96–112)
Creatinine, Ser: 0.97 mg/dL (ref 0.50–1.10)
Glucose, Bld: 101 mg/dL — ABNORMAL HIGH (ref 70–99)
Sodium: 136 mEq/L (ref 135–145)

## 2013-04-02 LAB — GLUCOSE, CAPILLARY
Glucose-Capillary: 171 mg/dL — ABNORMAL HIGH (ref 70–99)
Glucose-Capillary: 64 mg/dL — ABNORMAL LOW (ref 70–99)
Glucose-Capillary: 142 mg/dL — ABNORMAL HIGH (ref 70–99)
Glucose-Capillary: 227 mg/dL — ABNORMAL HIGH (ref 70–99)
Glucose-Capillary: 225 mg/dL — ABNORMAL HIGH (ref 70–99)

## 2013-04-02 LAB — LACTIC ACID, PLASMA: Lactic Acid, Venous: 1.5 mmol/L (ref 0.5–2.2)

## 2013-04-02 MED ORDER — INSULIN ASPART 100 UNIT/ML ~~LOC~~ SOLN
30.0000 [IU] | Freq: Three times a day (TID) | SUBCUTANEOUS | Status: DC
  Administered 2013-04-02 – 2013-04-04 (×5): 30 [IU] via SUBCUTANEOUS

## 2013-04-02 MED ORDER — PREDNISONE 50 MG PO TABS
50.0000 mg | ORAL_TABLET | Freq: Every day | ORAL | Status: DC
  Administered 2013-04-02 – 2013-04-04 (×3): 50 mg via ORAL
  Filled 2013-04-02 (×4): qty 1

## 2013-04-02 MED ORDER — IOHEXOL 350 MG/ML SOLN
100.0000 mL | Freq: Once | INTRAVENOUS | Status: AC | PRN
  Administered 2013-04-02: 100 mL via INTRAVENOUS

## 2013-04-02 NOTE — Progress Notes (Signed)
Subjective: Patient got her echocardiogram performed this morning. She states that she had some congestion and shortness of breath during the procedure.   She continues to have cough with clear sputum production yesterday.  Blood sugar in the 60s this morning after receiving her mealtime coverage. Patient states that she usually eats every 2 hours at home and she is not eating as much or as frequently while she is in the hospital.   Objective: Vital signs in last 24 hours: Filed Vitals:   04/01/13 0510 04/01/13 1405 04/01/13 2015 04/02/13 0442  BP: 174/80 154/78 123/53 134/69  Pulse: 83 72 67 68  Temp: 98.1 F (36.7 C) 98.4 F (36.9 C) 97.9 F (36.6 C) 97.4 F (36.3 C)  TempSrc: Oral Oral Oral Oral  Resp: 24  20 20   Height:      Weight: 263 lb 3.7 oz (119.4 kg)   260 lb 12.9 oz (118.3 kg)  SpO2: 99% 99% 100% 100%   Weight change: -3 lb 12 oz (-1.7 kg)  Intake/Output Summary (Last 24 hours) at 04/02/13 0803 Last data filed at 04/02/13 0453  Gross per 24 hour  Intake    480 ml  Output   2900 ml  Net  -2420 ml    Physical Exam Blood pressure 134/69, pulse 68, temperature 97.4 F (36.3 C), temperature source Oral, resp. rate 20, height 5' 3.5" (1.613 m), weight 260 lb 12.9 oz (118.3 kg), last menstrual period 10/18/1995, SpO2 100.00%. General:  Morbidly obese, No acute distress, alert and oriented x 3, appears ill but no acute distress HEENT:  PERRL, EOMI, oropharynx clear Cardiovascular:  Distant heart sounds Respiratory:  Clear to auscultation bilaterally, no rales, no wheezing, good air movement. Patient reports chest pressure with deep inspiration. Abdomen:  Obese, soft, nondistended, nontender, bowel sounds present Extremities:  Warm and well-perfused, trace edema.  Skin: Warm, dry, no rashes Neuro: Not anxious appearing, no depressed mood, normal affect  Lab Results: Basic Metabolic Panel:  Recent Labs Lab 03/31/13 1509 04/01/13 0505  NA 138 132*  K 4.1 5.0   CL 100 98  CO2 25 22  GLUCOSE 240* 384*  BUN 12 15  CREATININE 0.87 0.79  CALCIUM 9.6 9.0   Liver Function Tests:  Recent Labs Lab 03/31/13 2115  AST 32  ALT 36*  ALKPHOS 96  BILITOT 0.3  PROT 7.2  ALBUMIN 3.6   CBC:  Recent Labs Lab 03/31/13 1435  WBC 6.9  HGB 13.1  HCT 39.5  MCV 84.8  PLT 171   Cardiac Enzymes:  Recent Labs Lab 04/01/13 0800  TROPONINI <0.30   BNP:  Recent Labs Lab 03/31/13 1435  PROBNP 120.8   CBG:  Recent Labs Lab 03/31/13 2054 04/01/13 0606 04/01/13 1059 04/01/13 1626 04/01/13 2050 04/02/13 0613  GLUCAP 326* 380* 216* 101* 180* 171*   Urine Drug Screen: Drugs of Abuse     Component Value Date/Time   LABOPIA NEG 08/08/2011 1600   COCAINSCRNUR NEG 08/08/2011 1600   LABBENZ NEG 08/08/2011 1600   AMPHETMU NEG 08/08/2011 1600    Urinalysis:  Recent Labs Lab 04/01/13 0347  COLORURINE YELLOW  LABSPEC 1.028  PHURINE 5.0  GLUCOSEU >1000*  HGBUR NEGATIVE  BILIRUBINUR NEGATIVE  KETONESUR 15*  PROTEINUR NEGATIVE  UROBILINOGEN 0.2  NITRITE NEGATIVE  LEUKOCYTESUR NEGATIVE    Studies/Results: Dg Chest 2 View  03/31/2013   *RADIOLOGY REPORT*  Clinical Data: Shortness of breath  CHEST - 2 VIEW  Comparison: 10/21/2011  Findings: Cardiomediastinal silhouette is stable.  Mild hyperinflation.  No acute infiltrate or pleural effusion.  No pulmonary edema.  Stable degenerative changes thoracic spine.  IMPRESSION: No active disease.  No significant change.   Original Report Authenticated By: Natasha Mead, M.D.   Medications:  Medications reviewed  Scheduled Meds: . allopurinol  300 mg Oral Daily  . doxycycline  100 mg Oral Q12H  . DULoxetine  80 mg Oral Daily  . furosemide  40 mg Oral Daily  . gabapentin  300 mg Oral TID  . heparin  5,000 Units Subcutaneous Q8H  . insulin aspart  0-5 Units Subcutaneous QHS  . insulin aspart  0-9 Units Subcutaneous TID WC  . insulin aspart  50 Units Subcutaneous TID WC  . insulin glargine   70 Units Subcutaneous BID  . montelukast  10 mg Oral QHS  . pantoprazole  40 mg Oral Daily  . propranolol  20 mg Oral BID  . simvastatin  40 mg Oral QHS  . sodium chloride  3 mL Intravenous Q12H  . Vitamin D (Ergocalciferol)  50,000 Units Oral Q7 days   Continuous Infusions:  PRN Meds:.sodium chloride, albuterol, diphenhydrAMINE-zinc acetate, fluticasone, HYDROcodone-acetaminophen, ipratropium, meclizine, methocarbamol, olopatadine, sodium chloride  Assessment/Plan:  1. Dyspnea with exertion: Concern for cardiogenic causes given the changes on EKG (new Q waves). Likely not a heart failure exacerbation as supported by a normal chest x-ray, normal physical exam, and normal proBNP. COPD exacerbation is unlikely since she is not suffering from cough or sputum production and no wheezing on exam. LDH normal. 6/17: Patient now with sore throat, shortness of breath, cough with clear sputum production. Patient started on doxycycline yesterday. Patient does now endorse central chest pressure with deep inspiration. -waiting for ECHO results -CT Angio today to rule out PE   2. Lactic acidosis: Unclear etiology at this time. No evidence of infection or sepsis. Hemolysis of the sample, liver dysfunction, and metformin are potential causes, though renal function is WNL. AST 32, ALT 36. PE can also cause lactic acidosis 6/16: lactic acid trending down, pt not septic appearing, no acute distress, no hypotension 6/17: clinically stable -CT angiography chest - Hold metformin  -Recheck lactic acid today   3. COPD:  See plan for dyspnea on exertion above. She received a dose of levofloxacin in a dose of methylprednisolone in the emergency department.  -continue doxycycline - Nebulized albuterol and ipratropium every 6 hours PRN -cont home singulair  4. Hypertension: Her home regimen includes furosemide 40 mg a day. She is also on propranolol 20 mg twice a day for tremors. We will not increase propranolol  (non-selective beta-antagonist) in the setting of COPD and possible asthma. We will continue her home regimen for now. BP 134/69 this morning - Continue furosemide PO 40 mg daily  - Continue propranolol 20 mg twice a day   5. Stage II CKD: Stable. Baseline creatinine is around 0.90 with an estimated GFR of 70. At admission, her creatinine was 0.87 with an estimated GFR of 72.   6. 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 Her home regimen includes insulin aspart 60 units 3 times a day before meals, insulin glargine 70 units twice a day, and metformin 1000 mg twice a day. A urine microalbumin-creatinine ratio was normal in 2012.  6/16: CBGs elevated this morning 6/17: Did not eat full breakfast and got 50 u, BS dropped to 60s. Patient states that she eats a lot larger meals at home normally  -  Continue insulin glargine 70U BID  - decrease meal coverage insulin aspart to 30U TID  - sensitive scale correctional insulin aspart qAC/qHS  - Hold metformin, as noted above  -cont gabapentin  Chronic Sciatica and Back pain Pt complaining of her chronic leg and back pain today -patient on home norco, which we continued -encourage patient to ask for PRN meds  Prophylaxis: heparin 5,000 units [place] TID  Disposition: OPC patient. Will need follow up in clinic    LOS: 2 days   [place]enton Ar 04/02/2013, 8:03 AM

## 2013-04-02 NOTE — Progress Notes (Signed)
Hypoglycemic Event  CBG: 64  Treatment: 2 grape juices   Symptoms: weak, dizzy  Follow-up CBG: Time:  1014 CBG Result: 116  Possible Reasons for Event: Medication regimen. 50 units ordered for meal coverage  Comments/MD notified: MD notified    Dion Saucier  Remember to initiate Hypoglycemia Order Set & complete

## 2013-04-02 NOTE — Progress Notes (Signed)
Pt ambulated 100 ft with rolling walker and 2 L of 02. Pt c/o of SOB. Respiratory to administer breathing treatment after ambulation. Back to chair after ambulation. Dion Saucier

## 2013-04-02 NOTE — Progress Notes (Signed)
Patient refusing CPAP. She knows to call if she changes her mind. RT will monitor.

## 2013-04-02 NOTE — Progress Notes (Signed)
  Echocardiogram 2D Echocardiogram has been performed.  Cathie Beams 04/02/2013, 8:47 AM

## 2013-04-02 NOTE — Progress Notes (Signed)
  Date: 04/02/2013  Patient name: [name] Rice  Medical record number: [id number]  Date of birth: 1956-03-15   This patient has been seen and the plan of care was discussed with the house staff. Please see their note for complete details. I concur with their findings with the following additions/corrections:  Felt hypoglycemic symptoms this morning after not eating her usual meals.  Admits to clear sputum production and still some SOB.  Given her clinical picture, would treat at COPD exacerbation with a component of heart failure, I suspect some diastolic failure.  LA has improved, patient is clinically improving. CT chest w/o PE.  OOB today, PT involvement. Cont nebs and agree with course of doxycycline. Decrease insulin dose due to decreased PO intake.  Jonah Blue, DO 04/02/2013, 2:06 PM

## 2013-04-03 DIAGNOSIS — N182 Chronic kidney disease, stage 2 (mild): Secondary | ICD-10-CM

## 2013-04-03 LAB — GLUCOSE, CAPILLARY: Glucose-Capillary: 287 mg/dL — ABNORMAL HIGH (ref 70–99)

## 2013-04-03 NOTE — Care Management Note (Unsigned)
    Page 1 of 1   04/03/2013     3:47:35 PM   CARE MANAGEMENT NOTE 04/03/2013  Patient:  [name] Rice, [name] Rice   Account Number:  [id number]  Date Initiated:  04/03/2013  Documentation initiated by:  Rajni Holsworth  Subjective/Objective Assessment:   PT ADM ON 03/31/13 WITH COPD EXACERBATION.  PTA, PT LIVES AT HOME WITH DAUGHTER.  SHE IS ON CHRONIC OXYGEN AND BIPAP, PROVIDED BY [facility].     Action/Plan:   PT HAS BSC AND ROLLATOR AT HOME.  WILL FOLLOW FOR ADDITIONAL HOME NEEDS AS PT PROGRESSES, THOUGH PT DENIES ANY AT THIS TIME.   Anticipated DC Date:  04/04/2013   Anticipated DC Plan:  HOME/SELF CARE      DC Planning Services  CM consult      Choice offered to / List presented to:             Status of service:  In process, will continue to follow Medicare Important Message given?   (If response is "NO", the following Medicare IM given date fields will be blank) Date Medicare IM given:   Date Additional Medicare IM given:    Discharge Disposition:    Per UR Regulation:  Reviewed for med. necessity/level of care/duration of stay  If discussed at Long Length of Stay Meetings, dates discussed:    Comments:

## 2013-04-03 NOTE — Progress Notes (Signed)
Pt to place own CPAP home mask prior to bedtime tonight.  RT will recheck patient later tonight.

## 2013-04-03 NOTE — Progress Notes (Signed)
Inpatient Diabetes Program Recommendations  Results for VIOLETA, LECOUNT (MRN [id number]) as of 04/03/2013 15:57  Ref. Range 04/02/2013 11:12 04/02/2013 16:07 04/02/2013 21:07 04/03/2013 05:56 04/03/2013 11:06  Glucose-Capillary Latest Range: 70-99 mg/dL 409 (H) 811 (H) 914 (H) 263 (H) 287 (H)   Hyperglycemia d/t steroids.  Recommendations: Increase Novolog meal coverage insulin to 40 units tidwc. Increase Novolog correction to moderate tidwc and hs  Thank you. Ailene Ards, RD, LDN, CDE Inpatient Diabetes Coordinator [phone]

## 2013-04-03 NOTE — Progress Notes (Signed)
Subjective: ECHO and CTA yesterday without acute findings  Patient feels very tired today, states she used the CPAP last night after her daughter brought in her own mask and tubing. She was able to walk down the halls twice yesterday with her walker. She did not have any chest pain and only very minimal shortness of breath toward the end of the walk.  Patient endorses over the last year she has felt very tired and has been sleeping 12-24 hours at a time. She often goes to sleep after lunch because she feels exhausted and then sleeps through the night until the next day.  She also endorses chronic diarrhea since an episode of acute diverticulitis in December 2013.   Objective: Vital signs in last 24 hours: Filed Vitals:   04/02/13 1500 04/02/13 1553 04/02/13 1938 04/03/13 0501  BP:   119/63 120/76  Pulse:   74 85  Temp:   98 F (36.7 C) 97.5 F (36.4 C)  TempSrc:   Oral Oral  Resp:  1 20 20   Height:      Weight:      SpO2: 98%  99% 98%   Weight change:   Intake/Output Summary (Last 24 hours) at 04/03/13 0721 Last data filed at 04/02/13 1732  Gross per 24 hour  Intake    480 ml  Output    851 ml  Net   -371 ml    Physical Exam Blood pressure 120/76, pulse 85, temperature 97.5 F (36.4 C), temperature source Oral, resp. rate 20, height 5' 3.5" (1.613 m), weight 260 lb 12.9 oz (118.3 kg), last menstrual period 10/18/1995, SpO2 98.00%. General:  Morbidly obese, No acute distress, alert and oriented x 3, appears ill but no acute distress HEENT:  PERRL, EOMI, oropharynx clear Cardiovascular:  Distant heart sounds Respiratory:  Clear to auscultation bilaterally, no rales, no wheezing, good air movement. Abdomen:  Obese, soft, nondistended, nontender, bowel sounds present Extremities:  Warm and well-perfused, 1+ edema.  Skin: Warm, dry, no rashes Neuro: Not anxious appearing, no depressed mood, normal affect  Lab Results: Basic Metabolic Panel:  Recent Labs Lab  04/01/13 0505 04/02/13 1315  NA 132* 136  K 5.0 4.2  CL 98 98  CO2 22 27  GLUCOSE 384* 101*  BUN 15 19  CREATININE 0.79 0.97  CALCIUM 9.0 8.7   Liver Function Tests:  Recent Labs Lab 03/31/13 2115  AST 32  ALT 36*  ALKPHOS 96  BILITOT 0.3  PROT 7.2  ALBUMIN 3.6   CBC:  Recent Labs Lab 03/31/13 1435  WBC 6.9  HGB 13.1  HCT 39.5  MCV 84.8  PLT 171   Cardiac Enzymes:  Recent Labs Lab 04/01/13 0800  TROPONINI <0.30   BNP:  Recent Labs Lab 03/31/13 1435  PROBNP 120.8   CBG:  Recent Labs Lab 04/02/13 0955 04/02/13 1014 04/02/13 1112 04/02/13 1607 04/02/13 2107 04/03/13 0556  GLUCAP 64* 116* 142* 227* 225* 263*   Urine Drug Screen: Drugs of Abuse     Component Value Date/Time   LABOPIA NEG 08/08/2011 1600   COCAINSCRNUR NEG 08/08/2011 1600   LABBENZ NEG 08/08/2011 1600   AMPHETMU NEG 08/08/2011 1600    Urinalysis:  Recent Labs Lab 04/01/13 0347  COLORURINE YELLOW  LABSPEC 1.028  PHURINE 5.0  GLUCOSEU >1000*  HGBUR NEGATIVE  BILIRUBINUR NEGATIVE  KETONESUR 15*  PROTEINUR NEGATIVE  UROBILINOGEN 0.2  NITRITE NEGATIVE  LEUKOCYTESUR NEGATIVE    Studies/Results: Ct Angio Chest Pe W/cm &/or Wo Cm  04/02/2013   *RADIOLOGY REPORT*  Clinical Data: Chest pain and cough.  CT ANGIOGRAPHY CHEST  Technique:  Multidetector CT imaging of the chest using the standard protocol during bolus administration of intravenous contrast. Multiplanar reconstructed images including MIPs were obtained and reviewed to evaluate the vascular anatomy.  Contrast: OMNIPAQUE IOHEXOL 350 MG/ML SOLN  Comparison: None  Findings: The chest wall is unremarkable.  No supraclavicular or axillary mass or adenopathy.  No obvious breast masses.  The thyroid gland is grossly normal.  The bony thorax is intact. Moderate degenerative changes with bridging osteophytes.  The heart is normal in size.  No pericardial effusion.  Prominent pericardial and epicardial fat.  The aorta is  normal in caliber. No dissection.  The esophagus is grossly normal.  The pulmonary arterial tree is fairly well opacified.  No definite filling defects to suggest pulmonary emboli.  Examination of the lung parenchyma demonstrates no acute pulmonary findings.  Mild dependent bibasilar atelectasis.  No infiltrates or effusions.  The upper abdomen is unremarkable.  IMPRESSION:  1.  No CT findings for pulmonary embolism. 2.  Normal thoracic aorta for age. 3.  No acute pulmonary findings.   Original Report Authenticated By: Rudie Meyer, M.D.   Medications:  Medications reviewed  Scheduled Meds: . allopurinol  300 mg Oral Daily  . doxycycline  100 mg Oral Q12H  . DULoxetine  80 mg Oral Daily  . furosemide  40 mg Oral Daily  . gabapentin  300 mg Oral TID  . heparin  5,000 Units Subcutaneous Q8H  . insulin aspart  0-5 Units Subcutaneous QHS  . insulin aspart  0-9 Units Subcutaneous TID WC  . insulin aspart  30 Units Subcutaneous TID WC  . insulin glargine  70 Units Subcutaneous BID  . montelukast  10 mg Oral QHS  . pantoprazole  40 mg Oral Daily  . predniSONE  50 mg Oral Q breakfast  . propranolol  20 mg Oral BID  . simvastatin  40 mg Oral QHS  . sodium chloride  3 mL Intravenous Q12H  . Vitamin D (Ergocalciferol)  50,000 Units Oral Q7 days   Continuous Infusions:  PRN Meds:.sodium chloride, albuterol, diphenhydrAMINE-zinc acetate, fluticasone, HYDROcodone-acetaminophen, ipratropium, meclizine, methocarbamol, olopatadine, sodium chloride  Assessment/Plan:  Dyspnea on exertion and fatigue 6/17: Patient now with sore throat, shortness of breath, cough with clear sputum production. Patient started on doxycycline yesterday. Patient does now endorse central chest pressure with deep inspiration. 6/18: ECHO with EF 55%, no wall motion abnormalities, calcified MV. CTA negative for PE. Treating for suspected COPD exacerbation, but will check TSH today. Will also recommend getting up to date on  age-appropriate cancer screening (mammogram). Last colonoscopy was in 2010 with moderate diverticulosis, due for another colonoscopy in 2015. Hyperglycemia another possible etiology of fatigue.  -check TSH -continue doxycycline, prednisone, duonebs  Lactic acidosis: now resolved - Hold metformin    COPD:  See plan for dyspnea on exertion above. She received a dose of levofloxacin and a dose of methylprednisolone in the emergency department.  -continue doxycycline, added prednisone 6/17 -duonebs every 6 hours PRN -cont home singulair  Hypertension: Her home regimen includes furosemide 40 mg a day. She is also on propranolol 20 mg twice a day for tremors. We will not increase propranolol (non-selective beta-antagonist) in the setting of COPD and possible asthma. We will continue her home regimen for now. BP 134/69 this morning - Continue furosemide PO 40 mg daily  - Continue propranolol 20 mg twice a  day   Stage II CKD: Stable. Baseline creatinine is around 0.90 with an estimated GFR of 70. At admission, her creatinine was 0.87 with an estimated GFR of 72.   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 Her home regimen includes insulin aspart 60 units 3 times a day before meals, insulin glargine 70 units twice a day, and metformin 1000 mg twice a day. A urine microalbumin-creatinine ratio was normal in 2012.  6/16: CBGs elevated this morning 6/17: Did not eat full breakfast and got 50 u, BS dropped to 60s. Patient states that she eats a lot larger meals at home normally 6/18: CBGs in 200s this morning  - Continue insulin glargine 70U BID  - meal coverage insulin aspart 30U TID  - sensitive scale correctional insulin aspart qAC/qHS  - Hold metformin, as noted above  -cont gabapentin  Chronic Sciatica and Back pain Pt complaining of her chronic leg and back pain today -patient on home norco, which we continued -encourage  patient to ask for PRN meds  Prophylaxis: heparin 5,000 units [place] TID  Disposition: OPC patient. Will need follow up in clinic     LOS: 3 days   [place] Ar 04/03/2013, 7:21 AM

## 2013-04-03 NOTE — Progress Notes (Signed)
  Date: 04/03/2013  Patient name: [name] Rice  Medical record number: [id number]  Date of birth: 30-Aug-1956   This patient has been seen and the plan of care was discussed with the house staff. Please see their note for complete details. I concur with their findings with the following additions/corrections:  Feels better today, but still "worn out".  She admits her SOB is better and she has ambulated.  She is now using her CPAP mask.  Likely a COPD exacerbation that is resolving. Ambulate, make sure O2 sats are stable on her current O2 requirements, and I expect D/C tomorrow.  Agree with doxy and prednisone tx.  Jonah Blue, DO 04/03/2013, 1:46 PM

## 2013-04-04 DIAGNOSIS — E119 Type 2 diabetes mellitus without complications: Secondary | ICD-10-CM

## 2013-04-04 LAB — GLUCOSE, CAPILLARY
Glucose-Capillary: 95 mg/dL (ref 70–99)
Glucose-Capillary: 288 mg/dL — ABNORMAL HIGH (ref 70–99)

## 2013-04-04 MED ORDER — PREDNISONE 50 MG PO TABS: ORAL_TABLET | ORAL | Status: DC

## 2013-04-04 MED ORDER — MENTHOL 3 MG MT LOZG
1.0000 | LOZENGE | OROMUCOSAL | Status: DC | PRN
  Filled 2013-04-04: qty 9

## 2013-04-04 MED ORDER — DOXYCYCLINE HYCLATE 100 MG PO TABS: 100.0000 mg | ORAL_TABLET | Freq: Two times a day (BID) | ORAL | Status: DC

## 2013-04-04 MED ORDER — INSULIN ASPART 100 UNIT/ML ~~LOC~~ SOLN: 30.0000 [IU] | Freq: Three times a day (TID) | SUBCUTANEOUS | Status: DC

## 2013-04-04 NOTE — Discharge Summary (Signed)
Name: [name] Rice MRN: [id number] DOB: May 21, 1956 57 y.o. PCP: Elyse Jarvis, MD  Date of Admission: 03/31/2013  2:40 PM Date of Discharge: 04/04/2013 Attending Physician: Jonah Blue, DO  Discharge Diagnosis: 1. Acute mild COPD exacerbation (on 2 L O2 qhs at home)  2. Hypertension  3. Stage II CKD  4. 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 Chronic Sciatica and Back pain  6. Health maintenance  7. OSA on cpap  8. Lactic acidosis, resolved   Discharge Medications:   Medication List    TAKE these medications       allopurinol 300 MG tablet  Commonly known as:  ZYLOPRIM  Take 300 mg by mouth daily.     doxycycline 100 MG tablet  Commonly known as:  VIBRA-TABS  Take 1 tablet (100 mg total) by mouth every 12 (twelve) hours.     DULoxetine 20 MG capsule  Commonly known as:  CYMBALTA  Patient takes 20mg  in addition to 60 mg a day with total of 80 mg /day     Fluticasone-Salmeterol 250-50 MCG/DOSE Aepb  Commonly known as:  ADVAIR DISKUS  Inhale 1 puff into the lungs 2 (two) times daily.     furosemide 40 MG tablet  Commonly known as:  LASIX  Take 40 mg by mouth daily.     gabapentin 300 MG capsule  Commonly known as:  NEURONTIN  Take 300 mg by mouth 3 (three) times daily.     HYDROcodone-acetaminophen 5-325 MG per tablet  Commonly known as:  NORCO/VICODIN  Take 1-2 tablets by mouth 2 (two) times daily as needed for pain.     insulin aspart 100 UNIT/ML injection  Commonly known as:  novoLOG  Inject 30 Units into the skin 3 (three) times daily before meals.     insulin glargine 100 UNIT/ML injection  Commonly known as:  LANTUS  Inject 70 Units into the skin 2 (two) times daily.     meclizine 25 MG tablet  Commonly known as:  ANTIVERT  Take 1 tablet (25 mg total) by mouth 3 (three) times daily as needed for dizziness.     metFORMIN 1000 MG tablet  Commonly known as:  GLUCOPHAGE  Take 1 tablet (1,000 mg total) by mouth 2 (two)  times daily with a meal.     methocarbamol 500 MG tablet  Commonly known as:  ROBAXIN  Take 500 mg by mouth 3 (three) times daily as needed (for muscle spasms).     mometasone 50 MCG/ACT nasal spray  Commonly known as:  NASONEX  Place 2 sprays into the nose daily as needed (for nasal inflammation).     montelukast 10 MG tablet  Commonly known as:  SINGULAIR  Take 10 mg by mouth at bedtime.     neomycin-polymyxin-hydrocortisone otic solution  Commonly known as:  CORTISPORIN  Place 3 drops into both ears 3 (three) times daily as needed (for psoriasis in ear, only as needed).     olopatadine 0.1 % ophthalmic solution  Commonly known as:  PATANOL  Place 1 drop into both eyes 2 (two) times daily as needed. Itching eyes     pantoprazole 40 MG tablet  Commonly known as:  PROTONIX  Take 1 tablet (40 mg total) by mouth daily.     pravastatin 40 MG tablet  Commonly known as:  PRAVACHOL  Take 1 tablet (40 mg total) by mouth daily.     predniSONE 50 MG tablet  Commonly known as:  DELTASONE  Take 1 tablet 03/05/13 then stop     propranolol 20 MG tablet  Commonly known as:  INDERAL  Take 20 mg by mouth 2 (two) times daily.     triamcinolone cream 0.5 %  Commonly known as:  KENALOG  Apply 1 application topically 3 (three) times daily as needed (for facial psoriasis).     VENTOLIN HFA 108 (90 BASE) MCG/ACT inhaler  Generic drug:  albuterol  Inhale 2 puffs into the lungs every 6 (six) hours as needed for shortness of breath. Wheezing       Vitamin D (Ergocalciferol) 50000 UNITS Caps  Commonly known as:  DRISDOL  Take 50,000 Units by mouth every 7 (seven) days. On tuesdays        Disposition and follow-up:   [name] Rice was discharged from [facility] in stable condition.  At the hospital follow up visit please address:  1.  Diabetes-check glucose readings and titrate medication if needed (see diabetes section)  2.  Labs / imaging needed at time of  follow-up: BMET  3.  Pending labs/ test needing follow-up: none  Follow-up Appointments:   Discharge Instructions:      Future Appointments Provider Department Dept Phone   04/08/2013 2:15 PM Linward Headland, MD [facility] [phone]      Consultations: none  Procedures Performed:  Dg Chest 2 View  03/31/2013   *RADIOLOGY REPORT*  Clinical Data: Shortness of breath  CHEST - 2 VIEW  Comparison: 10/21/2011  Findings: Cardiomediastinal silhouette is stable.  Mild hyperinflation.  No acute infiltrate or pleural effusion.  No pulmonary edema.  Stable degenerative changes thoracic spine.  IMPRESSION: No active disease.  No significant change.   Original Report Authenticated By: Natasha Mead, M.D.   Ct Angio Chest Pe W/cm &/or Wo Cm  04/02/2013   *RADIOLOGY REPORT*  Clinical Data: Chest pain and cough.  CT ANGIOGRAPHY CHEST  Technique:  Multidetector CT imaging of the chest using the standard protocol during bolus administration of intravenous contrast. Multiplanar reconstructed images including MIPs were obtained and reviewed to evaluate the vascular anatomy.  Contrast: OMNIPAQUE IOHEXOL 350 MG/ML SOLN  Comparison: None  Findings: The chest wall is unremarkable.  No supraclavicular or axillary mass or adenopathy.  No obvious breast masses.  The thyroid gland is grossly normal.  The bony thorax is intact. Moderate degenerative changes with bridging osteophytes.  The heart is normal in size.  No pericardial effusion.  Prominent pericardial and epicardial fat.  The aorta is normal in caliber. No dissection.  The esophagus is grossly normal.  The pulmonary arterial tree is fairly well opacified.  No definite filling defects to suggest pulmonary emboli.  Examination of the lung parenchyma demonstrates no acute pulmonary findings.  Mild dependent bibasilar atelectasis.  No infiltrates or effusions.  The upper abdomen is unremarkable.  IMPRESSION:  1.  No CT findings for pulmonary  embolism. 2.  Normal thoracic aorta for age. 3.  No acute pulmonary findings.   Original Report Authenticated By: Rudie Meyer, M.D.    2D Echo: 04/02/13 Study Conclusions  - Left ventricle: The cavity size was normal. Wall thickness was increased in a pattern of moderate LVH. Systolic function was normal. The estimated ejection fraction was in the range of 55% to 60%. Wall motion was normal; there were no regional wall motion abnormalities. - Mitral valve: Calcified annulus. Mildly thickened leaflets    Cardiac Cath: none  Admission HPI:  Chief Complaint: Dyspnea with exertion    History of Present Illness: 57 year old woman with morbid obesity, COPD, hypertension, hyperlipidemia, and former tobacco smoker; presenting to the emergency department with several days of dyspnea with exertion. Onset was 5 days ago. Patient was in her normal state of health 6 days ago. No provoking incident identified by the patient. She describes dyspnea with even a minimal amount of exertion; standing up makes her short of breath. She does not report cough or sputum production. She denies orthopnea and paroxysmal nocturnal dyspnea. She denies having chest pain now or in the past. She denies palpitations and worsening edema. She does report more frequent headaches but denies dizziness. No changes in her vision or hearing.    Review of Systems:  Constitutional: Positive for fever (Tm 99.5), chills and malaise/fatigue.  HENT: Negative for hearing loss, congestion, sore throat and tinnitus.  Eyes: Negative for blurred vision and double vision.  Respiratory: Positive for shortness of breath. Negative for cough and sputum production.  Cardiovascular: Negative for chest pain, palpitations, orthopnea, leg swelling and PND.  Gastrointestinal: Positive for heartburn and diarrhea. Negative for nausea, vomiting, abdominal pain, constipation and blood in stool.  Genitourinary: Negative for dysuria and hematuria.    Musculoskeletal: Positive for back pain (chronic). Negative for joint pain.  Skin: Positive for rash (chronic).  Neurological: Positive for weakness and headaches. Negative for dizziness, sensory change and focal weakness.    Physical exam:  VITALS: BP 153/82, HR 119, RR 15, temp 98.83F, SpO2 95% on 2 L [place]  GENERAL: obses; no acute distress  HEAD: atraumatic, normocephalic  EYES: pupils equal, round and reactive; sclera anicteric; normal conjunctiva  EARS: canals patent and TMs normal bilaterally  NOSE/THROAT: oropharynx clear, moist mucous membranes, pink gums, normal dentition  NECK: supple, thyroid normal in size and without palpable nodules  LYMPH: no cervical or supraclavicular lymphadenopathy  LUNGS: clear to auscultation bilaterally, normal work of breathing  HEART: normal rate and regular rhythm; normal S1 and S2 without S3 or S4; no murmurs, rubs, or clicks  PULSES: radial and dorsalis pedis pulses are 2+ and symmetric  ABDOMEN: soft, normal bowel sounds, no masses, tenderness with palpation of the left flank (a chronic problem according to the patient)  SKIN: warm, dry, intact, normal turgor, scaling dermatitis of the eyebrows consistent with seborrhea  EXTREMITIES: no peripheral edema, clubbing, or cyanosis  PSYCH: patient is alert and oriented, mood and affect are normal and congruent, thought content is normal without delusions, thought process is linear, speech is normal and non-pressured, behavior is normal   Hospital Course by problem list: 1. Acute mild COPD exacerbation (on 2 L O2 qhs at home)  2. Hypertension  3. Stage II CKD  4. 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 Chronic Sciatica and Back pain  6. Health maintenance  7. OSA on cpap  8. Lactic acidosis, resolved   57 y.o with history of COPD presented 03/31/13 with dyspnea on exertion, fatigue cough with clear sputum likely mild acute COPD exacerbation.   1. Acute mild COPD  exacerbation (on 2 L O2 qhs at home)  Dyspnea much improved by discharge and likely secondary to #1 thought CT chest was performed and negative for pulmonary embolus.  Initially started on Levaquin x 1 dose.  We continued doxycycline (day 4/5), Solumedrol x 1 day completed prednisone (day 3), Duonebs as needed. Will will discharge with one more day of Prednisone for total of 5 days of steroids.  We continued her home singulair.  She will follow up 04/08/13 at 2:15 PM with Dr. Manson Passey in the [facility]. On the last day she ambulated in hallway approximately 500 feet without shortness of breath, weakness or dizziness. Patients oxygen sats remained above 92-96% throughout and she returned to room resting at bedside without oxygen at this time.   2. Hypertension  Blood pressure on day of discharge 149/71. Her home regimen includes furosemide 40 mg a day. She is also on propranolol 20 mg twice a day for tremors as well.   3. Stage II CKD  Stable   4. 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 Her home regimen was insulin aspart 60 units 3 times a day before meals, insulin glargine 70 units twice a day, and metformin 1000 mg twice a day. Metformin was discontinued this admission due to lactic acidosis which resolved by discharged.  Her glucose levels were too low on her home dose initially so the meal time insulin was decreased.  On steroids her glucose levels were still elevated intermittently. We will continue insulin glargine 70 units BID, meal coverage insulin aspart 30 U TID at discharge though diabetes coordinator recommends increasing to 40 units with meals.  She also received sliding scale (sensitive) this admission.  We resumed Metformin at discharge.  We continued her on Gabapentin.  Outpatient she will need to have her diabetic medications adjusted.    5. Chronic Sciatica and Back pain  Continued her on her home Norco.    6.  Health maintenance  She will need outpatient mammogram, repeat urine microalbumin-creatinine ratio in the future.   7. OSA on cpap  Stable  8. Lactic acidosis, resolved  Held Metformin this admission  She was on Heparin for DVT prophylaxis.     Discharge Vitals:   BP 149/71  Pulse 66  Temp(Src) 98.3 F (36.8 C) (Oral)  Resp 18  Ht 5' 3.5" (1.613 m)  Wt 257 lb 15 oz (117 kg)  BMI 44.97 kg/m2  SpO2 99%  LMP 10/18/1995  Discharge physical exam: General: resting in bed, NAD  HEENT: [place]/at, no scleral icterus  Cardiac: RRR, no rubs, murmurs or gallops  Pulm: clear to auscultation bilaterally, no wheezes, rales, or rhonchi, cpap in place  Abd: soft, nontender, nondistended, BS present, obese  Ext: warm and well perfused, no pedal edema  Neuro: alert and oriented X3, CN 2-12 grossly intact, moving all 4 extremities   Discharge Labs:  Results for KAMEKA, WHAN (MRN [id number]) as of 04/04/2013 10:49  Ref. Range 03/31/2013 15:09 03/31/2013 21:15 04/01/2013 05:05 04/02/2013 13:15  Sodium Latest Range: 135-145 mEq/L 138  132 (L) 136  Potassium Latest Range: 3.5-5.1 mEq/L 4.1  5.0 4.2  Chloride Latest Range: 96-112 mEq/L 100  98 98  CO2 Latest Range: 19-32 mEq/L 25  22 27   BUN Latest Range: 6-23 mg/dL 12  15 19   Creatinine Latest Range: 0.50-1.10 mg/dL 4.09  8.11 9.14  Calcium Latest Range: 8.4-10.5 mg/dL 9.6  9.0 8.7  GFR calc non Af Amer Latest Range: >90 mL/min 72 (L)  89 (L) 63 (L)  GFR calc Af Amer Latest Range: >90 mL/min 83 (L)  >90 73 (L)  Glucose Latest Range: 70-99 mg/dL 782 (H)  956 (H) 213 (H)  Alkaline Phosphatase Latest Range: 39-117 U/L  96    Albumin Latest Range: 3.5-5.2 g/dL  3.6    AST Latest Range: 0-37 U/L  32  ALT Latest Range: 0-35 U/L  36 (H)    Total Protein Latest Range: 6.0-8.3 g/dL  7.2    Bilirubin, Direct Latest Range: 0.0-0.3 mg/dL  <4.0    Indirect Bilirubin Latest Range: 0.3-0.9 mg/dL  NOT CALCULATED    Total Bilirubin Latest Range: 0.3-1.2 mg/dL   0.3     Results for ELLERY, TASH (MRN [id number]) as of 04/04/2013 10:49  Ref. Range 03/31/2013 14:35 03/31/2013 14:58 04/01/2013 08:00  LDH Latest Range: 94-250 U/L   198  Troponin I Latest Range: <0.30 ng/mL   <0.30  Troponin i, poc Latest Range: 0.00-0.08 ng/mL  0.03   Pro B Natriuretic peptide (BNP) Latest Range: 0-125 pg/mL 120.8    Results for SHEVA, MCDOUGLE (MRN [id number]) as of 04/04/2013 10:49  Ref. Range 03/31/2013 16:37 03/31/2013 18:59 04/01/2013 09:07 04/02/2013 13:15  Lactic Acid, Venous Latest Range: 0.5-2.2 mmol/L 4.77 (H) 6.6 (H) 4.0 (H) 1.5    Results for AMOS, GABER (MRN [id number]) as of 04/04/2013 10:49  Ref. Range 03/31/2013 14:35  WBC Latest Range: 4.0-10.5 K/uL 6.9  RBC Latest Range: 3.87-5.11 MIL/uL 4.66  Hemoglobin Latest Range: 12.0-15.0 g/dL 95.2  HCT Latest Range: 36.0-46.0 % 39.5  MCV Latest Range: 78.0-100.0 fL 84.8  MCH Latest Range: 26.0-34.0 pg 28.1  MCHC Latest Range: 30.0-36.0 g/dL 84.1  RDW Latest Range: 11.5-15.5 % 14.5  Platelets Latest Range: 150-400 K/uL 171   Results for SHALAH, ESTELLE (MRN [id number]) as of 04/04/2013 10:49  Ref. Range 03/31/2013 15:09 04/01/2013 05:05 04/02/2013 13:15 04/03/2013 11:45  Glucose Latest Range: 70-99 mg/dL 253 (H) 664 (H) 403 (H)   TSH Latest Range: 0.350-4.500 uIU/mL    0.624   Results for XZARIA, TEO (MRN [id number]) as of 04/04/2013 10:49  Ref. Range 04/01/2013 03:47  Color, Urine Latest Range: YELLOW  YELLOW  APPearance Latest Range: CLEAR  CLEAR  Specific Gravity, Urine Latest Range: 1.005-1.030  1.028  pH Latest Range: 5.0-8.0  5.0  Glucose Latest Range: NEGATIVE mg/dL >8756 (A)  Bilirubin Urine Latest Range: NEGATIVE  NEGATIVE  Ketones, ur Latest Range: NEGATIVE mg/dL 15 (A)  Protein Latest Range: NEGATIVE mg/dL NEGATIVE  Urobilinogen, UA Latest Range: 0.0-1.0 mg/dL 0.2  Nitrite Latest Range: NEGATIVE  NEGATIVE  Leukocytes, UA Latest Range: NEGATIVE  NEGATIVE  Hgb urine dipstick Latest Range: NEGATIVE   NEGATIVE  WBC, UA Latest Range: <3 WBC/hpf 0-2  RBC / HPF Latest Range: <3 RBC/hpf 0-2  Squamous Epithelial / LPF Latest Range: RARE  FEW (A)  Bacteria, UA Latest Range: RARE  FEW (A)   Results for orders placed during the hospital encounter of 03/31/13 (from the past 24 hour(s))  GLUCOSE, CAPILLARY     Status: Abnormal   Collection Time    04/03/13 11:06 AM      Result Value Range   Glucose-Capillary 287 (*) 70 - 99 mg/dL  TSH     Status: None   Collection Time    04/03/13 11:45 AM      Result Value Range   TSH 0.624  0.350 - 4.500 uIU/mL  GLUCOSE, CAPILLARY     Status: Abnormal   Collection Time    04/03/13  4:16 PM      Result Value Range   Glucose-Capillary 236 (*) 70 - 99 mg/dL  GLUCOSE, CAPILLARY     Status: Abnormal   Collection Time    04/03/13  9:21 PM      Result Value Range   Glucose-Capillary 216 (*) 70 - 99  mg/dL  GLUCOSE, CAPILLARY     Status: None   Collection Time    04/04/13  7:08 AM      Result Value Range   Glucose-Capillary 95  70 - 99 mg/dL  GLUCOSE, CAPILLARY     Status: Abnormal   Collection Time    04/04/13 10:54 AM      Result Value Range   Glucose-Capillary 288 (*) 70 - 99 mg/dL    Signed: Annett Gula, MD 04/04/2013, 11:04 AM   Time Spent on Discharge: 45 minutes Services Ordered on Discharge: none Equipment Ordered on Discharge: none

## 2013-04-04 NOTE — Progress Notes (Signed)
Pt ambulated in hallway approximately 500'.  No SOB, weakness or dizziness. Patients oxygen sats remained above 92-96% throughout.  Returned to room and pt resting at bedside without oxygen at this time. Call bell within reach.  Will continue to monitor. Thomas Hoff

## 2013-04-04 NOTE — Progress Notes (Addendum)
Subjective: Patient still having sinus drainage and sore throat this am.  Denies sob  Objective: Vital signs in last 24 hours: Filed Vitals:   04/03/13 2024 04/04/13 0036 04/04/13 0456 04/04/13 0459  BP: 126/57  149/71   Pulse: 76 60 66   Temp: 98.4 F (36.9 C)  98.3 F (36.8 C)   TempSrc: Oral  Oral   Resp: 18 16 18    Height:      Weight:    257 lb 15 oz (117 kg)  SpO2: 99% 97% 99%    Weight change:   Intake/Output Summary (Last 24 hours) at 04/04/13 1048 Last data filed at 04/04/13 0957  Gross per 24 hour  Intake    840 ml  Output      0 ml  Net    840 ml   Vitals reviewed. General: resting in bed, NAD HEENT: [place]/at, no scleral icterus Cardiac: RRR, no rubs, murmurs or gallops Pulm: clear to auscultation bilaterally, no wheezes, rales, or rhonchi, cpap in place  Abd: soft, nontender, nondistended, BS present, obese Ext: warm and well perfused, no pedal edema Neuro: alert and oriented X3, CN 2-12 grossly intact, moving all 4 extremities   Lab Results: Basic Metabolic Panel:  Recent Labs Lab 04/01/13 0505 04/02/13 1315  NA 132* 136  K 5.0 4.2  CL 98 98  CO2 22 27  GLUCOSE 384* 101*  BUN 15 19  CREATININE 0.79 0.97  CALCIUM 9.0 8.7   Liver Function Tests:  Recent Labs Lab 03/31/13 2115  AST 32  ALT 36*  ALKPHOS 96  BILITOT 0.3  PROT 7.2  ALBUMIN 3.6    CBC:  Recent Labs Lab 03/31/13 1435  WBC 6.9  HGB 13.1  HCT 39.5  MCV 84.8  PLT 171   Cardiac Enzymes:  Recent Labs Lab 04/01/13 0800  TROPONINI <0.30   BNP:  Recent Labs Lab 03/31/13 1435  PROBNP 120.8   CBG:  Recent Labs Lab 04/02/13 2107 04/03/13 0556 04/03/13 1106 04/03/13 1616 04/03/13 2121 04/04/13 0708  GLUCAP 225* 263* 287* 236* 216* 95   Thyroid Function Tests:  Recent Labs Lab 04/03/13 1145  TSH 0.624   Urinalysis:  Recent Labs Lab 04/01/13 0347  COLORURINE YELLOW  LABSPEC 1.028  PHURINE 5.0  GLUCOSEU >1000*  HGBUR NEGATIVE  BILIRUBINUR  NEGATIVE  KETONESUR 15*  PROTEINUR NEGATIVE  UROBILINOGEN 0.2  NITRITE NEGATIVE  LEUKOCYTESUR NEGATIVE   Misc. Labs: none  Micro Results: No results found for this or any previous visit (from the past 240 hour(s)). Studies/Results: Ct Angio Chest Pe W/cm &/or Wo Cm  04/02/2013   *RADIOLOGY REPORT*  Clinical Data: Chest pain and cough.  CT ANGIOGRAPHY CHEST  Technique:  Multidetector CT imaging of the chest using the standard protocol during bolus administration of intravenous contrast. Multiplanar reconstructed images including MIPs were obtained and reviewed to evaluate the vascular anatomy.  Contrast: OMNIPAQUE IOHEXOL 350 MG/ML SOLN  Comparison: None  Findings: The chest wall is unremarkable.  No supraclavicular or axillary mass or adenopathy.  No obvious breast masses.  The thyroid gland is grossly normal.  The bony thorax is intact. Moderate degenerative changes with bridging osteophytes.  The heart is normal in size.  No pericardial effusion.  Prominent pericardial and epicardial fat.  The aorta is normal in caliber. No dissection.  The esophagus is grossly normal.  The pulmonary arterial tree is fairly well opacified.  No definite filling defects to suggest pulmonary emboli.  Examination of the lung  parenchyma demonstrates no acute pulmonary findings.  Mild dependent bibasilar atelectasis.  No infiltrates or effusions.  The upper abdomen is unremarkable.  IMPRESSION:  1.  No CT findings for pulmonary embolism. 2.  Normal thoracic aorta for age. 3.  No acute pulmonary findings.   Original Report Authenticated By: Rudie Meyer, M.D.   Medications:  Scheduled Meds: . allopurinol  300 mg Oral Daily  . doxycycline  100 mg Oral Q12H  . DULoxetine  80 mg Oral Daily  . furosemide  40 mg Oral Daily  . gabapentin  300 mg Oral TID  . heparin  5,000 Units Subcutaneous Q8H  . insulin aspart  0-5 Units Subcutaneous QHS  . insulin aspart  0-9 Units Subcutaneous TID WC  . insulin aspart  30  Units Subcutaneous TID WC  . insulin glargine  70 Units Subcutaneous BID  . montelukast  10 mg Oral QHS  . pantoprazole  40 mg Oral Daily  . predniSONE  50 mg Oral Q breakfast  . propranolol  20 mg Oral BID  . simvastatin  40 mg Oral QHS  . sodium chloride  3 mL Intravenous Q12H  . Vitamin D (Ergocalciferol)  50,000 Units Oral Q7 days   Continuous Infusions:  PRN Meds:.sodium chloride, albuterol, diphenhydrAMINE-zinc acetate, fluticasone, HYDROcodone-acetaminophen, ipratropium, meclizine, menthol-cetylpyridinium, methocarbamol, olopatadine, sodium chloride Assessment/Plan: 57 y.o with history of COPD presented with dyspnea on exertion, cough with clear sputum likely mild acute COPD exacerbation.   1, Acute mild COPD exacerbation (on 2 L O2 qhs) -sob much improved  -continue doxycycline (day 4/5), Solumedrol x 1 day completed prednisone (day 3), duonebs. Will d/c with one more day of Prednisone  -duonebs every 6 PRN, cont home singulair  -likely d/c today with f/u 04/08/13 at 2:15 PM with Dr. Manson Passey  -will ambulate today with and without pulse ox   2. Hypertension -BP 149/71 today -Her home regimen includes furosemide 40 mg a day. She is also on propranolol 20 mg twice a day for tremors.   3. Stage II CKD -Stable  4. 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 Her home regimen includes insulin aspart 60 units 3 times a day before meals, insulin glargine 70 units twice a day, and metformin 1000 mg twice a day.  -cbgs still elevated intermittently.  This am 95 but over 24 hours 116-280s -Continue insulin glargine 70U BID, meal coverage insulin aspart 30 U TID, SSI -Held metformin  -cont gabapentin  -will need to adjust diabetic medication outpatient   5. Chronic Sciatica and Back pain  -patient on home norco, which we continued   6. Health maintenance  -Outpatient mammogram, repeat urine microalbumin-creatinine  ratio  7. OSA on cpap -stable  8 F/E/N -diet ordered   9 DVT px  -Heparin  Dispo: D/c today   The patient does have a current PCP (SAWHNEY,MEGHA, MD), therefore will be requiring OPC follow-up after discharge.   The patient does not have transportation limitations that hinder transportation to clinic appointments.  .Services Needed at time of discharge: Y = Yes, Blank = No PT:   OT:   RN:   Equipment:   Other:     LOS: 4 days   Annett Gula [phone] 04/04/2013, 10:48 AM

## 2013-04-04 NOTE — Discharge Summary (Signed)
  Date: 04/04/2013  Patient name: [name] Rice  Medical record number: [id number]  Date of birth: November 04, 1955   This patient has been seen, examined, and the plan of care was discussed with the house staff. Please see their note for complete details. I concur with their findings and plan. Stable for discharge.   Jonah Blue, DO 04/04/2013, 1:59 PM

## 2013-04-08 ENCOUNTER — Ambulatory Visit (INDEPENDENT_AMBULATORY_CARE_PROVIDER_SITE_OTHER): Payer: Medicare Other | Admitting: Internal Medicine

## 2013-04-08 ENCOUNTER — Other Ambulatory Visit: Payer: Self-pay | Admitting: Internal Medicine

## 2013-04-08 ENCOUNTER — Encounter: Payer: Self-pay | Admitting: Internal Medicine

## 2013-04-08 VITALS — BP 114/65 | HR 72 | Temp 98.1°F | Ht 63.0 in | Wt 266.3 lb

## 2013-04-08 DIAGNOSIS — E1149 Type 2 diabetes mellitus with other diabetic neurological complication: Secondary | ICD-10-CM

## 2013-04-08 DIAGNOSIS — J449 Chronic obstructive pulmonary disease, unspecified: Secondary | ICD-10-CM

## 2013-04-08 DIAGNOSIS — Z1239 Encounter for other screening for malignant neoplasm of breast: Secondary | ICD-10-CM | POA: Diagnosis not present

## 2013-04-08 DIAGNOSIS — N182 Chronic kidney disease, stage 2 (mild): Secondary | ICD-10-CM

## 2013-04-08 DIAGNOSIS — N63 Unspecified lump in unspecified breast: Secondary | ICD-10-CM

## 2013-04-08 DIAGNOSIS — Z0000 Encounter for general adult medical examination without abnormal findings: Secondary | ICD-10-CM

## 2013-04-08 LAB — GLUCOSE, CAPILLARY: Glucose-Capillary: 225 mg/dL — ABNORMAL HIGH (ref 70–99)

## 2013-04-08 NOTE — Assessment & Plan Note (Signed)
Lab Results  Component Value Date   HGBA1C 7.8 02/21/2013   HGBA1C 6.5 12/04/2012   HGBA1C 6.8 08/20/2012     Assessment: Diabetes control: fair control Progress toward A1C goal:  unchanged Comments: The patient's insulin regimen was significantly altered during her hospitalization, but she is still having blood sugars as low as 111, though also highs in the 300's.  This may represent a still erratic eating schedule given her recent hospitalization.  We will increase her mealtime insulin slowly.  Plan: Medications:  Continue metformin.  Continue lantus 70 BID.  Increase Aspart from 30 TID to 35 TID Home glucose monitoring: Frequency: 3 times a day Timing: before meals Instruction/counseling given: reminded to bring blood glucose meter & log to each visit Educational resources provided:   Self management tools provided:   Other plans: Recheck blood sugars in 2-3 weeks.  Foot exam and microalbumin done today.

## 2013-04-08 NOTE — Assessment & Plan Note (Signed)
Patient due for mammogram.  Order placed.

## 2013-04-08 NOTE — Progress Notes (Signed)
HPI The patient is a 57 y.o. female with a history of COPD, DM, HTN, COPD, OSA, presenting for a hospital follow-up  The patient was recently hospitalized 6/15-6/19 for a COPD exacerbation.  The patient was given antibiotics and steroids, and completed her course 1 day after hospital discharge.  She notes that initially her cough persisted, but it has improved within the last 24 hours, with resolution of sputum production.  The patient has been off-and-on home O2, most recently put back on 08/2012.  She notes that when she removes her oxygen, symptoms return, but while taking O2, she is asymptomatic.  She has only used her albuterol inhaler once in the last couple of days.  The patient has a history of DM.  She brings her glucometer today.  She notes since hospital discharge, her blood sugars have been "up and down".  She is using Lantus 70 BID, and Novolog 30 TID (which was decreased from her prior regimen of 60 units TID, due to decreased PO intake during her hospitalization).  The patient notes no episodes of hypoglycemia since hospital discharge.  ROS: General: no fevers, chills, changes in weight, changes in appetite Skin: no rash HEENT: no blurry vision, hearing changes, sore throat Pulm: see HPI CV: no chest pain, palpitations, shortness of breath Abd: no abdominal pain, nausea/vomiting, diarrhea/constipation GU: no dysuria, hematuria, polyuria Ext: no arthralgias, myalgias Neuro: no weakness, numbness, or tingling  Filed Vitals:   04/08/13 1425  BP: 114/65  Pulse: 72  Temp: 98.1 F (36.7 C)    PEX General: alert, cooperative, and in no apparent distress HEENT: pupils equal round and reactive to light, vision grossly intact, oropharynx clear and non-erythematous  Neck: supple, no lymphadenopathy Lungs: clear to ascultation bilaterally, normal work of respiration, no wheezes, rales, ronchi Heart: regular rate and rhythm, no murmurs, gallops, or rubs Abdomen: soft, non-tender,  non-distended, normal bowel sounds Extremities: 2+ DP/PT pulses bilaterally, no cyanosis, clubbing, or edema Neurologic: alert & oriented X3, cranial nerves II-XII intact, strength grossly intact, sensation intact to light touch  Current Outpatient Prescriptions on File Prior to Visit  Medication Sig Dispense Refill  . albuterol (VENTOLIN HFA) 108 (90 BASE) MCG/ACT inhaler Inhale 2 puffs into the lungs every 6 (six) hours as needed for shortness of breath. Wheezing       . allopurinol (ZYLOPRIM) 300 MG tablet Take 300 mg by mouth daily.      Marland Kitchen doxycycline (VIBRA-TABS) 100 MG tablet Take 1 tablet (100 mg total) by mouth every 12 (twelve) hours.  2 tablet  0  . DULoxetine (CYMBALTA) 20 MG capsule Patient takes 20mg  in addition to 60 mg a day with total of 80 mg /day  90 capsule  1  . Fluticasone-Salmeterol (ADVAIR DISKUS) 250-50 MCG/DOSE AEPB Inhale 1 puff into the lungs 2 (two) times daily.  1 each  5  . furosemide (LASIX) 40 MG tablet Take 40 mg by mouth daily.      Marland Kitchen gabapentin (NEURONTIN) 300 MG capsule Take 300 mg by mouth 3 (three) times daily.      Marland Kitchen HYDROcodone-acetaminophen (NORCO/VICODIN) 5-325 MG per tablet Take 1-2 tablets by mouth 2 (two) times daily as needed for pain.       Marland Kitchen insulin aspart (NOVOLOG) 100 UNIT/ML injection Inject 30 Units into the skin 3 (three) times daily before meals.  1 vial  12  . insulin glargine (LANTUS) 100 UNIT/ML injection Inject 70 Units into the skin 2 (two) times daily.      Marland Kitchen  meclizine (ANTIVERT) 25 MG tablet Take 1 tablet (25 mg total) by mouth 3 (three) times daily as needed for dizziness.  30 tablet  0  . metFORMIN (GLUCOPHAGE) 1000 MG tablet Take 1 tablet (1,000 mg total) by mouth 2 (two) times daily with a meal.  180 tablet  3  . methocarbamol (ROBAXIN) 500 MG tablet Take 500 mg by mouth 3 (three) times daily as needed (for muscle spasms).      . mometasone (NASONEX) 50 MCG/ACT nasal spray Place 2 sprays into the nose daily as needed (for nasal  inflammation).      . montelukast (SINGULAIR) 10 MG tablet Take 10 mg by mouth at bedtime.      Marland Kitchen neomycin-polymyxin-hydrocortisone (CORTISPORIN) otic solution Place 3 drops into both ears 3 (three) times daily as needed (for psoriasis in ear, only as needed).      Marland Kitchen olopatadine (PATANOL) 0.1 % ophthalmic solution Place 1 drop into both eyes 2 (two) times daily as needed. Itching eyes  5 mL  2  . pantoprazole (PROTONIX) 40 MG tablet Take 1 tablet (40 mg total) by mouth daily.  90 tablet  3  . pravastatin (PRAVACHOL) 40 MG tablet Take 1 tablet (40 mg total) by mouth daily.  90 tablet  3  . predniSONE (DELTASONE) 50 MG tablet Take 1 tablet 03/05/13 then stop  1 tablet  0  . propranolol (INDERAL) 20 MG tablet Take 20 mg by mouth 2 (two) times daily.      Marland Kitchen triamcinolone cream (KENALOG) 0.5 % Apply 1 application topically 3 (three) times daily as needed (for facial psoriasis).      . Vitamin D, Ergocalciferol, (DRISDOL) 50000 UNITS CAPS Take 50,000 Units by mouth every 7 (seven) days. On tuesdays       No current facility-administered medications on file prior to visit.    Assessment/Plan

## 2013-04-08 NOTE — Assessment & Plan Note (Signed)
The patient is recovering well from her COPD exacerbation.  Still requiring home O2, which she has needed since this last November. -continue advair, singulair, albuterol prn

## 2013-04-08 NOTE — Patient Instructions (Addendum)
General Instructions: Your COPD flare appears to have resolved.  Continue to use oxygen at home.  You may use your albuterol inhaler up to every 4 hours as needed.  For your diabetes, your blood sugars are still higher than we would like. -increase your Insulin Aspart (short-acting) to 35 units three times per day -continue Lantus 70 units twice per day  We are referring you for a mammogram.  We will contact you with this appointment.  Please return for a follow-up visit in 2-3 weeks.   Treatment Goals:  Goals (1 Years of Data) as of 04/08/13         As of Today 04/04/13 04/03/13 04/03/13 04/03/13     Blood Pressure    . Blood Pressure < 140/90  114/65 149/71 126/57 132/65 120/76    . Blood Pressure < 140/90  114/65 149/71 126/57 132/65 120/76     Result Component    . HEMOGLOBIN A1C < 7.0          . HEMOGLOBIN A1C < 7.0          . LDL CALC < 100          . LDL CALC < 130            Progress Toward Treatment Goals:  Treatment Goal 04/08/2013  Hemoglobin A1C unchanged  Blood pressure at goal    Self Care Goals & Plans:  Self Care Goal 03/01/2013  Manage my medications take my medicines as prescribed; bring my medications to every visit; refill my medications on time; follow the sick day instructions if I am sick  Monitor my health keep track of my blood glucose; bring my glucose meter and log to each visit; keep track of my weight; check my feet daily  Eat healthy foods eat more vegetables; eat fruit for snacks and desserts; eat baked foods instead of fried foods; eat smaller portions; drink diet soda or water instead of juice or soda  Be physically active take a walk every day; find an activity I enjoy    Home Blood Glucose Monitoring 04/08/2013  Check my blood sugar 3 times a day  When to check my blood sugar before meals     Care Management & Community Referrals:  Referral 04/08/2013  Referrals made for care management support none needed

## 2013-04-09 LAB — BASIC METABOLIC PANEL
BUN: 22 mg/dL (ref 6–23)
Calcium: 8.5 mg/dL (ref 8.4–10.5)
Chloride: 100 mEq/L (ref 96–112)
Creat: 1.24 mg/dL — ABNORMAL HIGH (ref 0.50–1.10)

## 2013-04-09 LAB — MICROALBUMIN / CREATININE URINE RATIO: Creatinine, Urine: 19 mg/dL

## 2013-04-09 NOTE — Progress Notes (Signed)
Case discussed with Dr. Brown at the time of the visit.  We reviewed the resident's history and exam and pertinent patient test results.  I agree with the assessment, diagnosis, and plan of care documented in the resident's note. 

## 2013-04-09 NOTE — Assessment & Plan Note (Signed)
Addendum: BMET showed an elevated creatinine.  Differential includes pre-renal (still subnormal PO intake since hospital discharge) vs AIN (antibiotics, though this was not seen during her hospitalization).  Called patient and informed her to drink plenty of fluids before her next office visit in 2-3 weeks.  Will recheck BMET at that time.

## 2013-04-12 ENCOUNTER — Other Ambulatory Visit: Payer: Self-pay | Admitting: Internal Medicine

## 2013-04-15 ENCOUNTER — Other Ambulatory Visit: Payer: Self-pay | Admitting: Internal Medicine

## 2013-04-15 DIAGNOSIS — N63 Unspecified lump in unspecified breast: Secondary | ICD-10-CM

## 2013-04-15 NOTE — Telephone Encounter (Signed)
Rx called in to pharmacy. 

## 2013-04-15 NOTE — Telephone Encounter (Signed)
Dr Delane Ginger will be new PCP and she has opening on the 14th Monday. Would you pls move her F/U appt from Dr brown (not PCP) to Dr Shiela Mayer sch - either the 14th or 21st?   I am refilling meds but I have not seen need for Robaxin addressed and she is taking TID everyday it seems. Due to age and other sedating meds, may need to reconsider quantity.

## 2013-04-15 NOTE — Telephone Encounter (Signed)
Three month supply as sees Dr Manson Passey regularly.

## 2013-04-15 NOTE — Telephone Encounter (Signed)
Front desk pool aware of Dr [employer] note about changing appt.

## 2013-04-18 ENCOUNTER — Other Ambulatory Visit: Payer: Medicare Other

## 2013-04-29 ENCOUNTER — Ambulatory Visit (INDEPENDENT_AMBULATORY_CARE_PROVIDER_SITE_OTHER): Payer: Medicare Other | Admitting: Internal Medicine

## 2013-04-29 ENCOUNTER — Encounter: Payer: Self-pay | Admitting: Internal Medicine

## 2013-04-29 VITALS — BP 125/69 | HR 77 | Temp 97.8°F | Ht 63.0 in | Wt 264.6 lb

## 2013-04-29 DIAGNOSIS — I10 Essential (primary) hypertension: Secondary | ICD-10-CM | POA: Diagnosis not present

## 2013-04-29 DIAGNOSIS — E785 Hyperlipidemia, unspecified: Secondary | ICD-10-CM | POA: Diagnosis not present

## 2013-04-29 DIAGNOSIS — E1149 Type 2 diabetes mellitus with other diabetic neurological complication: Secondary | ICD-10-CM | POA: Diagnosis not present

## 2013-04-29 DIAGNOSIS — R1032 Left lower quadrant pain: Secondary | ICD-10-CM

## 2013-04-29 DIAGNOSIS — K219 Gastro-esophageal reflux disease without esophagitis: Secondary | ICD-10-CM | POA: Diagnosis not present

## 2013-04-29 DIAGNOSIS — J4489 Other specified chronic obstructive pulmonary disease: Secondary | ICD-10-CM

## 2013-04-29 DIAGNOSIS — J449 Chronic obstructive pulmonary disease, unspecified: Secondary | ICD-10-CM

## 2013-04-29 DIAGNOSIS — M109 Gout, unspecified: Secondary | ICD-10-CM

## 2013-04-29 LAB — CBC WITH DIFFERENTIAL/PLATELET
Basophils Absolute: 0.1 10*3/uL (ref 0.0–0.1)
HCT: 40.5 % (ref 36.0–46.0)
Lymphocytes Relative: 27 % (ref 12–46)
Lymphs Abs: 2.6 10*3/uL (ref 0.7–4.0)
Monocytes Absolute: 0.6 10*3/uL (ref 0.1–1.0)
Neutro Abs: 5.9 10*3/uL (ref 1.7–7.7)
RBC: 4.89 MIL/uL (ref 3.87–5.11)
RDW: 15.9 % — ABNORMAL HIGH (ref 11.5–15.5)
WBC: 9.6 10*3/uL (ref 4.0–10.5)

## 2013-04-29 LAB — GLUCOSE, CAPILLARY

## 2013-04-29 MED ORDER — ALBUTEROL SULFATE HFA 108 (90 BASE) MCG/ACT IN AERS: 2.0000 | INHALATION_SPRAY | Freq: Four times a day (QID) | RESPIRATORY_TRACT | Status: DC | PRN

## 2013-04-29 MED ORDER — GABAPENTIN 300 MG PO CAPS: 300.0000 mg | ORAL_CAPSULE | Freq: Three times a day (TID) | ORAL | Status: DC

## 2013-04-29 MED ORDER — PANTOPRAZOLE SODIUM 40 MG PO TBEC: 40.0000 mg | DELAYED_RELEASE_TABLET | Freq: Every day | ORAL | Status: DC

## 2013-04-29 MED ORDER — "INSULIN SYRINGE-NEEDLE U-100 31G X 5/16"" 1 ML MISC": Status: DC

## 2013-04-29 MED ORDER — FUROSEMIDE 40 MG PO TABS: 40.0000 mg | ORAL_TABLET | Freq: Every day | ORAL | Status: DC

## 2013-04-29 MED ORDER — PRAVASTATIN SODIUM 40 MG PO TABS: 40.0000 mg | ORAL_TABLET | Freq: Every day | ORAL | Status: DC

## 2013-04-29 MED ORDER — ALLOPURINOL 300 MG PO TABS: 300.0000 mg | ORAL_TABLET | Freq: Every day | ORAL | Status: DC

## 2013-04-29 NOTE — Assessment & Plan Note (Addendum)
Lab Results  Component Value Date   HGBA1C 7.8 02/21/2013   HGBA1C 6.5 12/04/2012   HGBA1C 6.8 08/20/2012     Assessment: Diabetes control: fair control Progress toward A1C goal:  unchanged  Plan: Medications:  continue current medications; will recheck HA1C at next visit. Home glucose monitoring: Frequency: 3 times a day Timing: before meals

## 2013-04-29 NOTE — Assessment & Plan Note (Signed)
COPD is currently stable on advair bid and is not currently using continuous O2 at home.  Reminded patient that it is important to take advair as prescribed.

## 2013-04-29 NOTE — Assessment & Plan Note (Addendum)
BP Readings from Last 3 Encounters:  04/29/13 125/69  04/08/13 114/65  04/04/13 149/71    Lab Results  Component Value Date   NA 140 04/29/2013   K 3.7 04/29/2013   CREATININE 0.88 04/29/2013    Assessment: Blood pressure control: controlled Progress toward BP goal:  at goal  Plan: Medications:  continue current medications

## 2013-04-29 NOTE — Assessment & Plan Note (Signed)
Patient is still having reflux on pantoprazole.  We will revisit this issue at our next appointment.  Continue current medications.

## 2013-04-29 NOTE — Assessment & Plan Note (Signed)
Patient's lower abdominal pain is most likely transient and most likely is due to lovenox injections.  It is unlikely that it is due to her diverticulitis because she is not experiencing any symptoms consistent with diverticulitis.  I advised the patient to return to the clinic if her symptoms worsened or did not resolve.

## 2013-04-29 NOTE — Patient Instructions (Addendum)
General Instructions: Continue current medications.   Please remember to use your ADVAIR twice a day.    Progress Toward Treatment Goals:  Treatment Goal 04/29/2013  Hemoglobin A1C unchanged  Blood pressure at goal    Self Care Goals & Plans:  Self Care Goal 04/29/2013  Manage my medications take my medicines as prescribed  Monitor my health keep track of my blood glucose  Eat healthy foods -  Be physically active -    Home Blood Glucose Monitoring 04/29/2013  Check my blood sugar 3 times a day  When to check my blood sugar before meals     Care Management & Community Referrals:  Referral 04/29/2013  Referrals made for care management support none needed  Referrals made to community resources none

## 2013-04-29 NOTE — Progress Notes (Signed)
Subjective:    Patient ID: [name] Rice, female    DOB: 1956-04-16, 57 y.o.   MRN: [id number]  HPI Comments: Patient is a 57 yo female here for a continuity visit.  She has a PMH of COPD, HTN, hyperlipidemia and additional diagnoses outlined below.  She presents to the clinic with left lower abdominal pain since last Thursday.  The pain is a non-radiating sharp/stabbing pain she rates 4/10 that comes and goes.  She states she has some nodules that appeared under the skin after lovenox injections for her hospitalization in June for a COPD exacerbation.  She denies any fever, chills, N/V/D/C or blood in her stool.  She has a h/o diverticulitis but states the pain does not feel like her previous episodes.    COPD: She was hospitalized in June for a COPD exacerbation and was initially feeling better after discharge but began to feel SOB again.  She was on continuous 2L of O2 at home until Saturday when she began to feel better.  She does admit to forgetting to take the advair bid sometimes.  I stressed the importance of remembering to take her medication as directed.  She informed me that her daughter and her boyfriend were recently evicted from their apartment and have moved in with her.  She only has 1 bathroom in her residence which has gotten very hectic with 3 people sharing a bathroom.    DM: Patient's last HA1C was 7.8 in May 2014.  She continues to use Novolog 60 Units before meals and Lantus 70 Units bid.  She brought in her meter and the values did not show any hypoglycemic episodes.  She reports that she occasionally eats only 2 meals per day.  She states that sometimes she feels like her bs is getting low because she feels shaky and diaphoretic but does not check her bs then.  However she does drink some juice and feels much better.  GERD: Patient continues to have symptoms of reflux even on pantoprazole.     Abdominal Pain Pertinent negatives include no constipation, diarrhea, dysuria,  fever, nausea or vomiting.   Review of Systems  Constitutional: Negative for fever and chills.  Respiratory: Negative for cough, shortness of breath and wheezing.   Cardiovascular: Negative for chest pain and leg swelling.  Gastrointestinal: Positive for abdominal pain. Negative for nausea, vomiting, diarrhea, constipation and blood in stool.  Genitourinary: Negative for dysuria and difficulty urinating.  Musculoskeletal: Positive for back pain.  Neurological: Positive for dizziness and light-headedness.  All other systems reviewed and are negative.       Objective:   Physical Exam  Constitutional: She is oriented to person, place, and time. She appears well-developed and well-nourished. No distress.  HENT:  Head: Normocephalic and atraumatic.  Eyes: EOM are normal. Pupils are equal, round, and reactive to light.  Neck: Neck supple.  Cardiovascular: Normal rate, regular rhythm, normal heart sounds and intact distal pulses.  Exam reveals no gallop and no friction rub.   No murmur heard. Pulmonary/Chest: Effort normal and breath sounds normal. No respiratory distress. She has no wheezes. She has no rales.  Abdominal: Soft. Bowel sounds are normal. She exhibits no distension. There is tenderness (LLQ; tenderness over nodules on abdomen ). There is no rebound and no guarding.  Patient is obese.  Neurological: She is alert and oriented to person, place, and time.  Skin: Skin is warm and dry. She is not diaphoretic.      Assessment & Plan:

## 2013-04-30 LAB — BASIC METABOLIC PANEL WITH GFR
CO2: 26 mEq/L (ref 19–32)
Chloride: 99 mEq/L (ref 96–112)
Creat: 0.88 mg/dL (ref 0.50–1.10)
GFR, Est Non African American: 72 mL/min
Potassium: 3.7 mEq/L (ref 3.5–5.3)
Sodium: 140 mEq/L (ref 135–145)

## 2013-05-01 ENCOUNTER — Inpatient Hospital Stay: Admission: RE | Admit: 2013-05-01 | Payer: Medicare Other | Source: Ambulatory Visit

## 2013-05-01 DIAGNOSIS — M109 Gout, unspecified: Secondary | ICD-10-CM | POA: Insufficient documentation

## 2013-05-01 NOTE — Assessment & Plan Note (Signed)
Stable.  Refilled allopurinol.

## 2013-05-02 ENCOUNTER — Encounter: Payer: Medicare Other | Admitting: Internal Medicine

## 2013-05-02 NOTE — Progress Notes (Signed)
I saw patient and discussed her care with Dr. Delane Ginger soon after the resident saw the patient.  We reviewed the resident's history and exam and pertinent patient test results.  I agree with the assessment, diagnosis, and plan of care documented in the resident's note.

## 2013-05-03 ENCOUNTER — Other Ambulatory Visit: Payer: Self-pay | Admitting: Internal Medicine

## 2013-05-06 MED ORDER — INSULIN GLARGINE 100 UNIT/ML ~~LOC~~ SOLN: 70.0000 [IU] | Freq: Two times a day (BID) | SUBCUTANEOUS | Status: DC

## 2013-05-16 ENCOUNTER — Other Ambulatory Visit: Payer: Self-pay | Admitting: *Deleted

## 2013-05-16 MED ORDER — HYDROCORTISONE 1 % EX CREA: TOPICAL_CREAM | CUTANEOUS | Status: DC

## 2013-05-16 NOTE — Telephone Encounter (Signed)
This appears too potent for use on the face.  Who has been prescribing this?  Is patient followed by a dermatologist?

## 2013-05-16 NOTE — Telephone Encounter (Signed)
Dr. Meredith Pel is right that flourinated steroids are not to be used on the face (unless a dermatologist suggests otherwise).  Will change to hydrocortisone cream for now.

## 2013-05-16 NOTE — Telephone Encounter (Signed)
Called pt, left message.

## 2013-05-16 NOTE — Telephone Encounter (Signed)
This goes back to 2011 or early 2012, several md's have filled or refilled in last 2 years. i do not find any derm. referrals

## 2013-06-04 ENCOUNTER — Other Ambulatory Visit: Payer: Self-pay | Admitting: *Deleted

## 2013-06-04 DIAGNOSIS — E119 Type 2 diabetes mellitus without complications: Secondary | ICD-10-CM

## 2013-06-04 MED ORDER — METFORMIN HCL 1000 MG PO TABS: 1000.0000 mg | ORAL_TABLET | Freq: Two times a day (BID) | ORAL | Status: DC

## 2013-06-10 ENCOUNTER — Encounter: Payer: Self-pay | Admitting: Internal Medicine

## 2013-06-10 ENCOUNTER — Ambulatory Visit (INDEPENDENT_AMBULATORY_CARE_PROVIDER_SITE_OTHER): Payer: Medicare Other | Admitting: Internal Medicine

## 2013-06-10 VITALS — BP 125/63 | HR 75 | Temp 97.5°F | Ht 63.0 in | Wt 265.5 lb

## 2013-06-10 DIAGNOSIS — F3289 Other specified depressive episodes: Secondary | ICD-10-CM

## 2013-06-10 DIAGNOSIS — F341 Dysthymic disorder: Secondary | ICD-10-CM

## 2013-06-10 DIAGNOSIS — F418 Other specified anxiety disorders: Secondary | ICD-10-CM

## 2013-06-10 DIAGNOSIS — F32A Depression, unspecified: Secondary | ICD-10-CM

## 2013-06-10 DIAGNOSIS — E1149 Type 2 diabetes mellitus with other diabetic neurological complication: Secondary | ICD-10-CM

## 2013-06-10 DIAGNOSIS — F329 Major depressive disorder, single episode, unspecified: Secondary | ICD-10-CM

## 2013-06-10 LAB — GLUCOSE, CAPILLARY: Glucose-Capillary: 195 mg/dL — ABNORMAL HIGH (ref 70–99)

## 2013-06-10 LAB — POCT GLYCOSYLATED HEMOGLOBIN (HGB A1C): Hemoglobin A1C: 7.5

## 2013-06-10 MED ORDER — INSULIN ASPART 100 UNIT/ML ~~LOC~~ SOLN: 60.0000 [IU] | Freq: Three times a day (TID) | SUBCUTANEOUS | Status: DC

## 2013-06-10 MED ORDER — DULOXETINE HCL 20 MG PO CPEP: ORAL_CAPSULE | ORAL | Status: DC

## 2013-06-10 NOTE — Assessment & Plan Note (Signed)
Lab Results  Component Value Date   HGBA1C 7.5 06/10/2013   HGBA1C 7.8 02/21/2013   HGBA1C 6.5 12/04/2012     Assessment: Diabetes control: fair control Progress toward A1C goal:  improved  Plan: Medications:  continue current medications Instruction/counseling given: reminded to bring blood glucose meter & log to each visit Other plans: refill novolog

## 2013-06-10 NOTE — Assessment & Plan Note (Signed)
-  refilled cymbalta 

## 2013-06-10 NOTE — Progress Notes (Signed)
Patient ID: [name] Rice, female   DOB: [birth year], 57 y.o.   MRN: [id number]    Subjective:   Patient ID: AIDAH FORQUER female   DOB: 1956-03-25 57 y.o.   MRN: [id number]  HPI: Ms.Saundra Lacretia Nicks Sherpa is a 57 y.o. here for a f/u visit.  She has a PMH outlined below.  Patient appeared very upset during the appointment because she had received a speeding ticket on her way to the clinic today. She became upset with me when I inquired about the medications she was taking and that I would try to decrease some of these.  She insists that all of the doctors in this clinic have tried to make her quit some of her medications and she states that she needs "all of them."  She calmed down for a few minutes but then became upset again and decided to leave without my performing the exam portion.  She states she would like to find another doctor.  Please see problem based assessment and plan for further details of her chronic conditions.   Past Medical History  Diagnosis Date  . Anxiety   . Arthritis   . Asthma     no PFTs  . COPD (chronic obstructive pulmonary disease)     no PFTs  . Depression   . Diabetes mellitus   . GERD (gastroesophageal reflux disease)   . Hypertension   . HLD (hyperlipidemia)   . Dizziness   . History of colonic polyps   . Lumbar pain 2011    per x-ray - Multilevel spondylosis  . Cervical dystonia   . Tremor CCENTRAL NERVOUS SYSTEM  . Torn rotator cuff RT SHOULDER  . Diarrhea   . History of kidney stones   . Recurrent boils     "SLUSTER BOILS" ON ABD AND LABIA  . History of seborrheic dermatitis     face,ears  . OSA on CPAP 2008  . Sleep apnea     USES C-PAP  . Diverticulosis    Current Outpatient Prescriptions  Medication Sig Dispense Refill  . albuterol (PROAIR HFA) 108 (90 BASE) MCG/ACT inhaler Inhale 2 puffs into the lungs every 6 (six) hours as needed for wheezing or shortness of breath.  1 Inhaler  11  . allopurinol (ZYLOPRIM) 300 MG tablet Take 1 tablet (300 mg  total) by mouth daily.  30 tablet  3  . DULoxetine (CYMBALTA) 20 MG capsule Patient takes 20mg  in addition to 60 mg a day with total of 80 mg /day  90 capsule  3  . Fluticasone-Salmeterol (ADVAIR DISKUS) 250-50 MCG/DOSE AEPB Inhale 1 puff into the lungs 2 (two) times daily.  1 each  5  . furosemide (LASIX) 40 MG tablet Take 1 tablet (40 mg total) by mouth daily.  30 tablet  3  . gabapentin (NEURONTIN) 300 MG capsule Take 300 mg by mouth 2 (two) times daily.      Marland Kitchen HYDROcodone-acetaminophen (NORCO/VICODIN) 5-325 MG per tablet TAKE 1 TO 2 TABLETS BY MOUTH TWICE DAILY  45 tablet  2  . hydrocortisone cream 1 % Apply to affected area 2 times daily  30 g  1  . insulin aspart (NOVOLOG) 100 UNIT/ML injection Inject 60 Units into the skin 3 (three) times daily before meals.  1 vial  6  . insulin glargine (LANTUS) 100 UNIT/ML injection Inject 0.7 mLs (70 Units total) into the skin 2 (two) times daily.  50 mL  2  . Insulin Syringe-Needle U-100 31G X 5/16"  1 ML MISC Use as directed for insulin injections 5 times per day.  Diagnosis ICD9 250.00, on insulin.  200 each  11  . meclizine (ANTIVERT) 25 MG tablet Take 1 tablet (25 mg total) by mouth 3 (three) times daily as needed for dizziness.  30 tablet  0  . metFORMIN (GLUCOPHAGE) 1000 MG tablet Take 1 tablet (1,000 mg total) by mouth 2 (two) times daily with a meal.  180 tablet  3  . mometasone (NASONEX) 50 MCG/ACT nasal spray Place 2 sprays into the nose daily as needed (for nasal inflammation).      . montelukast (SINGULAIR) 10 MG tablet Take 10 mg by mouth at bedtime.      Marland Kitchen olopatadine (PATANOL) 0.1 % ophthalmic solution Place 1 drop into both eyes 2 (two) times daily as needed. Itching eyes  5 mL  2  . pantoprazole (PROTONIX) 40 MG tablet Take 1 tablet (40 mg total) by mouth daily.  90 tablet  3  . pravastatin (PRAVACHOL) 40 MG tablet Take 1 tablet (40 mg total) by mouth daily.  90 tablet  1  . propranolol (INDERAL) 20 MG tablet TAKE 1 TABLET BY MOUTH TWICE  DAILY  60 tablet  2  . methocarbamol (ROBAXIN) 500 MG tablet TAKE 1 TABLET BY MOUTH THREE TIMES DAILY AS NEEDED FOR PAIN  270 tablet  0  . neomycin-polymyxin-hydrocortisone (CORTISPORIN) otic solution Place 3 drops into both ears 3 (three) times daily as needed (for psoriasis in ear, only as needed).      . Vitamin D, Ergocalciferol, (DRISDOL) 50000 UNITS CAPS Take 50,000 Units by mouth every 7 (seven) days. On tuesdays       No current facility-administered medications for this visit.   Family History  Problem Relation Age of Onset  . Heart disease Brother   . Stroke Neg Hx   . Cancer Neg Hx    History   Social History  . Marital Status: Widowed    Spouse Name: N/A    Number of Children: N/A  . Years of Education: N/A   Social History Main Topics  . Smoking status: Former Games developer  . Smokeless tobacco: Former Neurosurgeon    Quit date: 07/08/1997  . Alcohol Use: No  . Drug Use: No  . Sexual Activity: Not Currently   Other Topics Concern  . None   Social History Narrative   Lives alone in [place].    Review of Systems:  Pertinent items are noted in HPI.  Objective:  Physical Exam: Filed Vitals:   06/10/13 1429  BP: 125/63  Pulse: 75  Temp: 97.5 F (36.4 C)  TempSrc: Oral  Height: 5\' 3"  (1.6 m)  Weight: 265 lb 8 oz (120.43 kg)  SpO2: 97%   Physical Exam Not performed; pt left before completion of the exam  Assessment & Plan:

## 2013-06-11 NOTE — Progress Notes (Signed)
The patient left before I could see her.

## 2013-06-19 ENCOUNTER — Encounter: Payer: Self-pay | Admitting: Internal Medicine

## 2013-06-19 NOTE — Addendum Note (Signed)
Addended by: Neomia Dear on: 06/19/2013 07:26 PM   Modules accepted: Orders

## 2013-06-25 ENCOUNTER — Other Ambulatory Visit: Payer: Self-pay | Admitting: *Deleted

## 2013-06-25 DIAGNOSIS — F32A Depression, unspecified: Secondary | ICD-10-CM

## 2013-06-25 DIAGNOSIS — F329 Major depressive disorder, single episode, unspecified: Secondary | ICD-10-CM

## 2013-06-26 ENCOUNTER — Telehealth: Payer: Self-pay | Admitting: Internal Medicine

## 2013-06-26 ENCOUNTER — Other Ambulatory Visit: Payer: Self-pay | Admitting: *Deleted

## 2013-06-27 ENCOUNTER — Telehealth: Payer: Self-pay | Admitting: *Deleted

## 2013-06-27 NOTE — Telephone Encounter (Signed)
Unable to reach pt, will call tomorrow

## 2013-06-27 NOTE — Telephone Encounter (Signed)
Pt has started taking Ferrous Sulfate 325 mg daily, again.  Can you refill?

## 2013-06-27 NOTE — Telephone Encounter (Signed)
Ferrous Sulfate is available OTC.  However, I don't find her to be anemic and ferrous sulfate can be harmful if taken without any medical reason. Please see the CBC on 04/29/13. Thanks.   Lab Results  Component Value Date   WBC 9.6 04/29/2013   HGB 13.3 04/29/2013   HCT 40.5 04/29/2013   MCV 82.8 04/29/2013   PLT 260 04/29/2013

## 2013-06-28 NOTE — Telephone Encounter (Signed)
Pt informed

## 2013-07-14 ENCOUNTER — Other Ambulatory Visit: Payer: Self-pay | Admitting: Internal Medicine

## 2013-07-15 NOTE — Telephone Encounter (Signed)
Does [name] Rice wish to remain a clinic patient? She had indicated during last visit when she left abruptly that she was "going to find her another doctor" because she felt that multiple providers had been trying to trim down her med list and she was dissatisfied with this recommendation. I will not refill her pain medication at this time until we find out this information.

## 2013-07-23 NOTE — Telephone Encounter (Signed)
closed

## 2013-07-24 ENCOUNTER — Other Ambulatory Visit: Payer: Self-pay | Admitting: *Deleted

## 2013-07-25 ENCOUNTER — Other Ambulatory Visit: Payer: Self-pay | Admitting: *Deleted

## 2013-07-25 IMAGING — CT CT ANGIO CHEST
2 of 6 series · 19 of 46 positions shown · IV contrast (APPLIED)
Comparison: None

CLINICAL DATA: Chest pain and cough.

CT ANGIOGRAPHY CHEST
TECHNIQUE: Multidetector CT imaging of the chest using the
standard protocol during bolus administration of intravenous
contrast. Multiplanar reconstructed images including MIPs were
obtained and reviewed to evaluate the vascular anatomy.
Contrast: 100mL OMNIPAQUE IOHEXOL 350 MG/ML SOLN

[Series 7: pulm embolism 1.0 b25f thin · axial · 0.79mm/px · z∈[-190,+84]mm · 16 of 301 slices shown]
[im 14/301  lung]
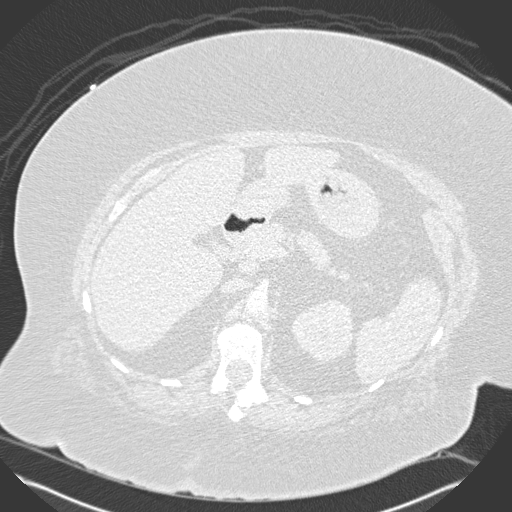
[im 40/301  soft-tissue]
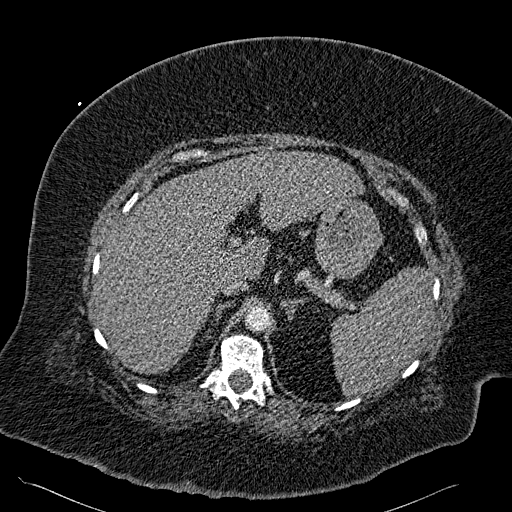
[im 53/301  lung]
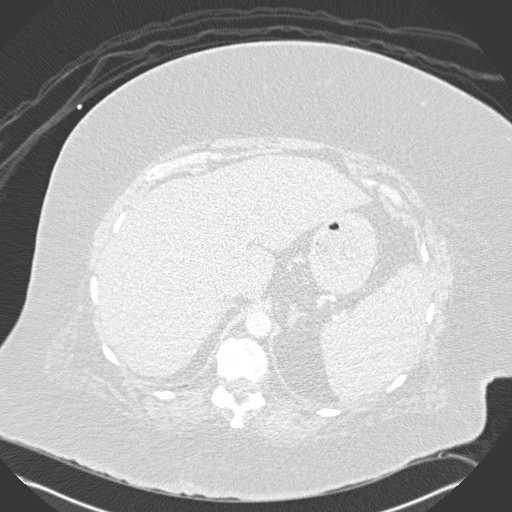
[im 66/301  soft-tissue]
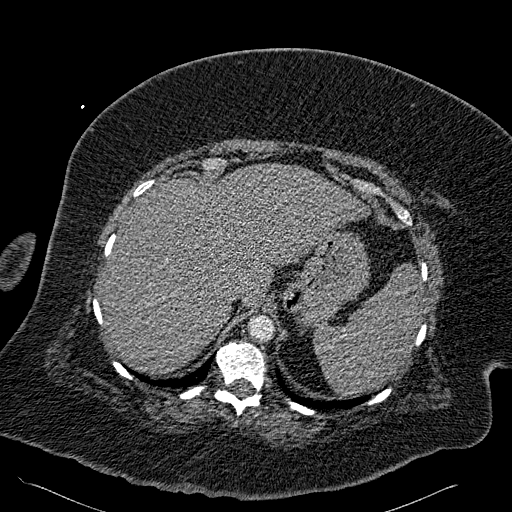
[im 92/301  lung]
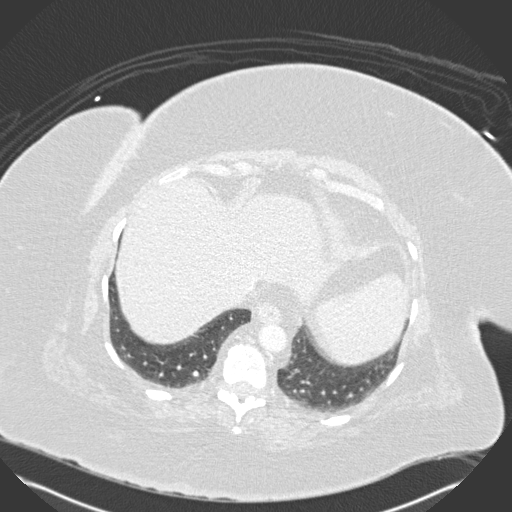
[im 105/301  soft-tissue]
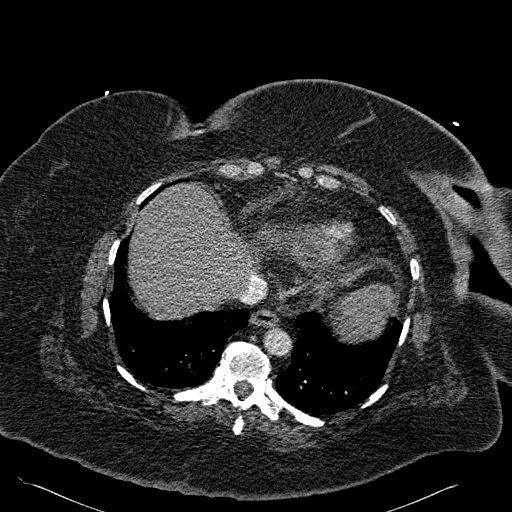
[im 118/301  lung]
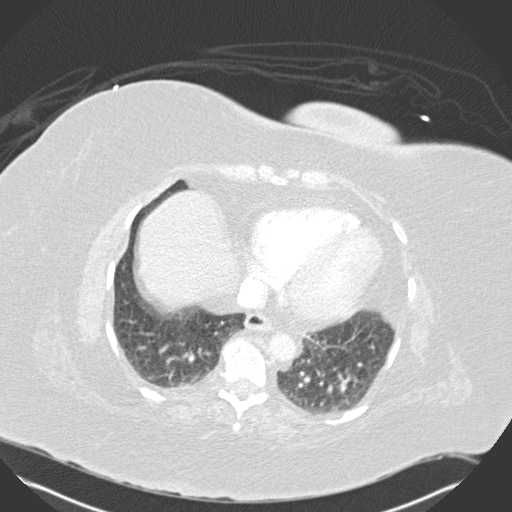
[im 144/301  soft-tissue]
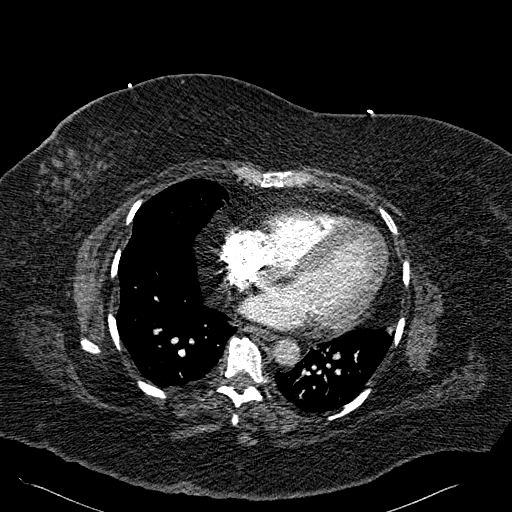
[im 157/301  lung]
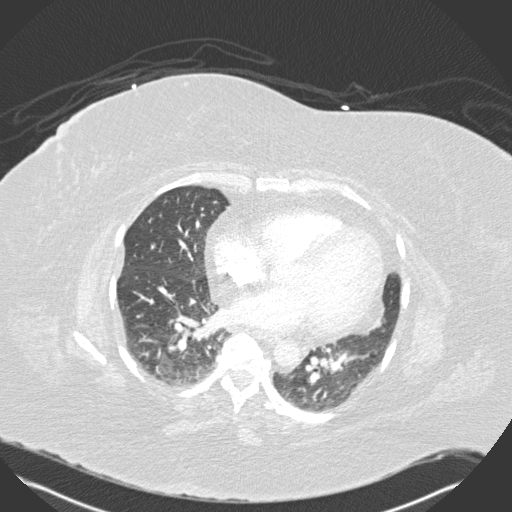
[im 183/301  soft-tissue]
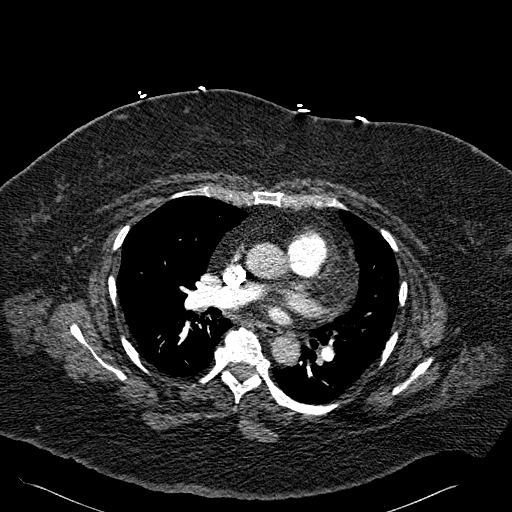
[im 196/301  lung]
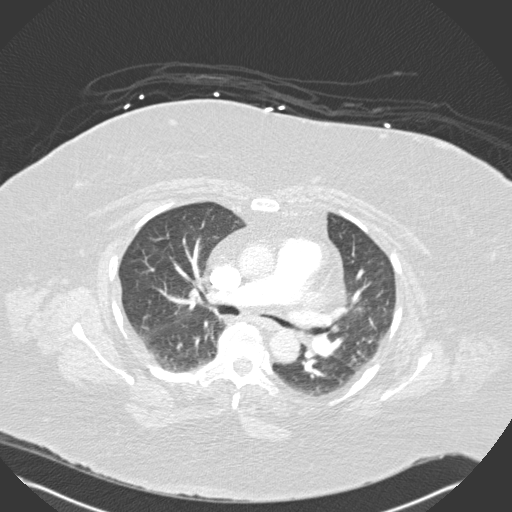
[im 209/301  soft-tissue]
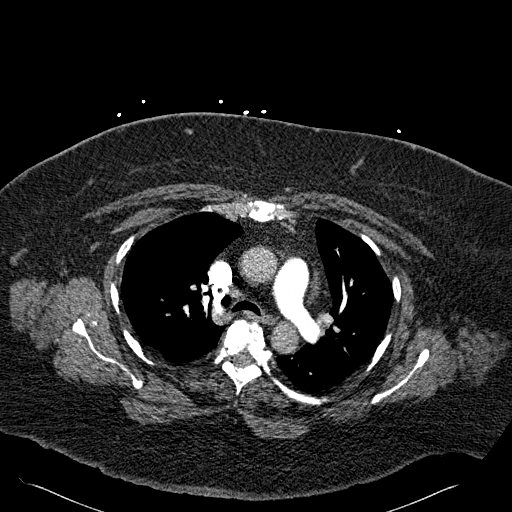
[im 235/301  lung]
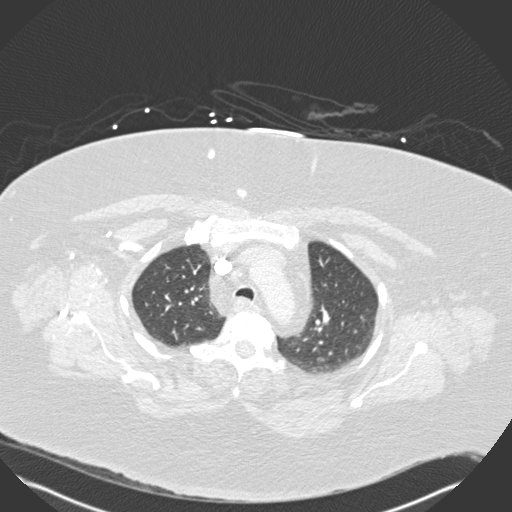
[im 248/301  soft-tissue]
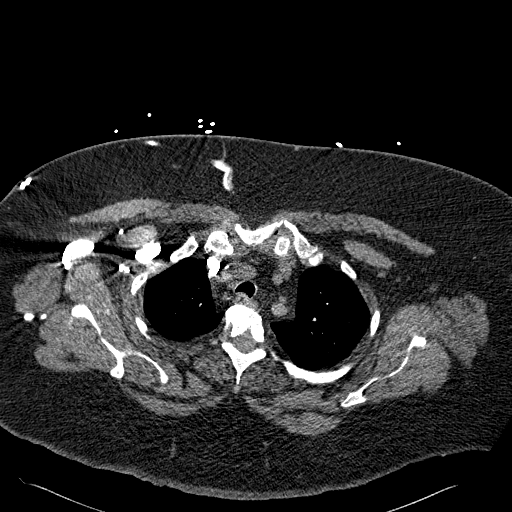
[im 261/301  lung]
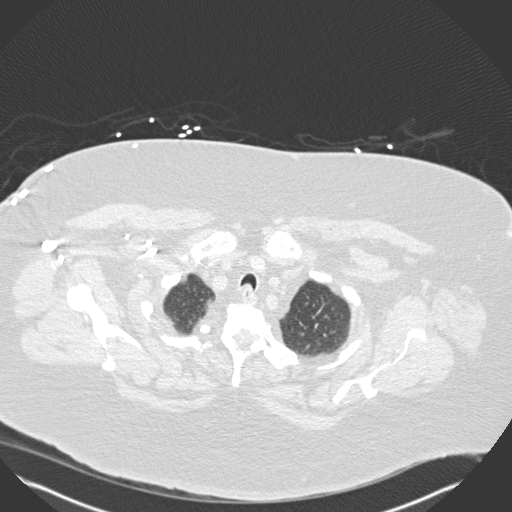
[im 287/301  soft-tissue]
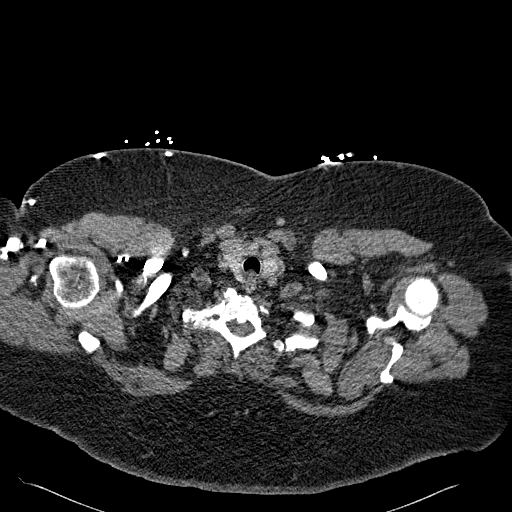

[Series 9: coronals · coronal · 0.70mm/px · 3 of 114 slices shown]
[im 29/114  soft-tissue]
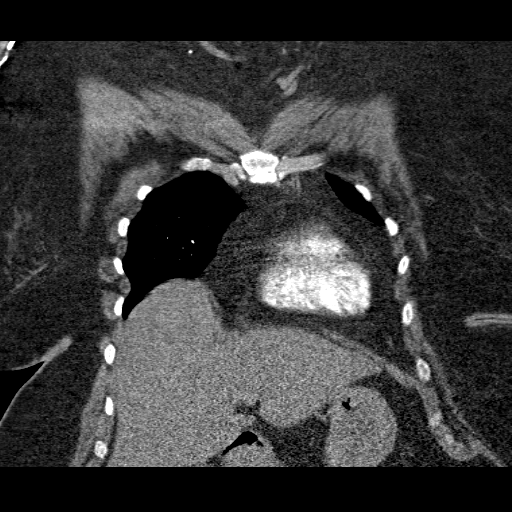
[im 57/114  soft-tissue]
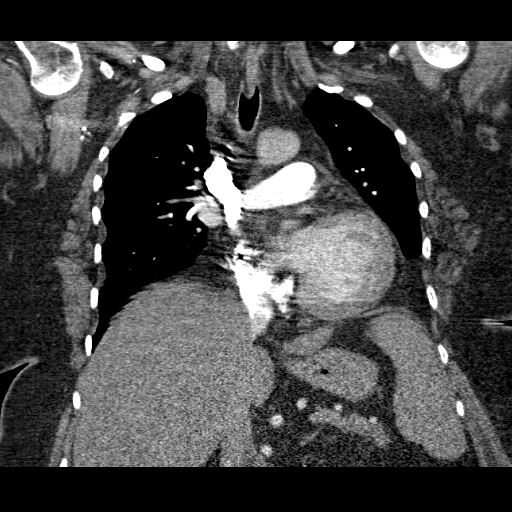
[im 85/114  soft-tissue]
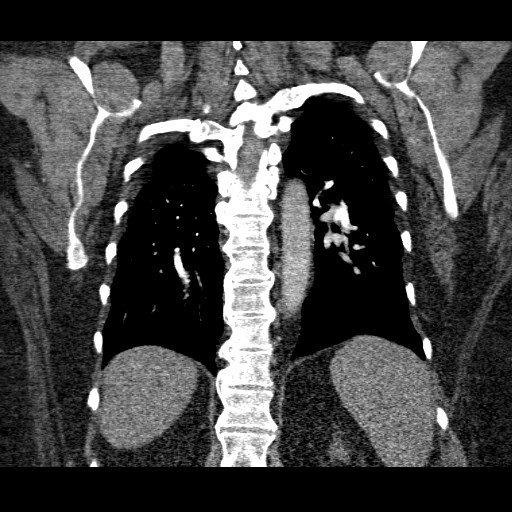

[19 of 46 positions shown; findings below may reference images not displayed]

FINDINGS: The chest wall is unremarkable.  No supraclavicular or
axillary mass or adenopathy.  No obvious breast masses.  The
thyroid gland is grossly normal.  The bony thorax is intact.
Moderate degenerative changes with bridging osteophytes.

The heart is normal in size.  No pericardial effusion.  Prominent
pericardial and epicardial fat.  The aorta is normal in caliber.
No dissection.  The esophagus is grossly normal.

The pulmonary arterial tree is fairly well opacified.  No definite
filling defects to suggest pulmonary emboli.

Examination of the lung parenchyma demonstrates no acute pulmonary
findings.  Mild dependent bibasilar atelectasis.  No infiltrates or
effusions.

The upper abdomen is unremarkable.
IMPRESSION: 1.  No CT findings for pulmonary embolism.
2.  Normal thoracic aorta for age.
3.  No acute pulmonary findings.

## 2013-07-25 NOTE — Telephone Encounter (Signed)
I talked with pt and she states she is not planning on staying with out clinic.  The reason she gave was that she is moving in 2 weeks to [facility].  She would like enough pain med to last until her move.

## 2013-07-30 ENCOUNTER — Other Ambulatory Visit: Payer: Self-pay | Admitting: *Deleted

## 2013-08-01 MED ORDER — MONTELUKAST SODIUM 10 MG PO TABS: 10.0000 mg | ORAL_TABLET | Freq: Every day | ORAL | Status: DC

## 2013-08-02 NOTE — Telephone Encounter (Signed)
Refill done.  

## 2013-08-05 ENCOUNTER — Other Ambulatory Visit: Payer: Self-pay | Admitting: *Deleted

## 2013-08-06 ENCOUNTER — Other Ambulatory Visit: Payer: Self-pay | Admitting: Internal Medicine

## 2013-08-12 ENCOUNTER — Other Ambulatory Visit: Payer: Self-pay | Admitting: *Deleted

## 2013-08-12 MED ORDER — PROPRANOLOL HCL 20 MG PO TABS: ORAL_TABLET | ORAL | Status: DC

## 2013-08-12 MED ORDER — HYDROCODONE-ACETAMINOPHEN 5-325 MG PO TABS: ORAL_TABLET | ORAL | Status: DC

## 2013-08-12 NOTE — Telephone Encounter (Signed)
Call from pt - states she needs refills until she sees her new doctor in [place] which is not until Dec 1.  Thanks

## 2013-08-12 NOTE — Telephone Encounter (Signed)
Pt call and informed of refills.

## 2013-08-16 ENCOUNTER — Ambulatory Visit: Payer: Medicare Other | Admitting: Physician Assistant

## 2013-08-21 ENCOUNTER — Ambulatory Visit (INDEPENDENT_AMBULATORY_CARE_PROVIDER_SITE_OTHER): Payer: Medicare Other | Admitting: Physician Assistant

## 2013-08-21 ENCOUNTER — Encounter: Payer: Self-pay | Admitting: Physician Assistant

## 2013-08-21 VITALS — BP 140/64 | HR 78 | Ht 63.5 in | Wt 257.0 lb

## 2013-08-21 DIAGNOSIS — E119 Type 2 diabetes mellitus without complications: Secondary | ICD-10-CM

## 2013-08-21 DIAGNOSIS — D1739 Benign lipomatous neoplasm of skin and subcutaneous tissue of other sites: Secondary | ICD-10-CM | POA: Diagnosis not present

## 2013-08-21 DIAGNOSIS — R109 Unspecified abdominal pain: Secondary | ICD-10-CM | POA: Diagnosis not present

## 2013-08-21 DIAGNOSIS — E1149 Type 2 diabetes mellitus with other diabetic neurological complication: Secondary | ICD-10-CM | POA: Diagnosis not present

## 2013-08-21 DIAGNOSIS — D171 Benign lipomatous neoplasm of skin and subcutaneous tissue of trunk: Secondary | ICD-10-CM

## 2013-08-21 DIAGNOSIS — Z23 Encounter for immunization: Secondary | ICD-10-CM

## 2013-08-21 LAB — POCT GLYCOSYLATED HEMOGLOBIN (HGB A1C): Hemoglobin A1C: 6.5

## 2013-08-21 MED ORDER — HYDROCODONE-ACETAMINOPHEN 5-325 MG PO TABS: ORAL_TABLET | ORAL | Status: DC

## 2013-08-21 MED ORDER — AMBULATORY NON FORMULARY MEDICATION: Status: DC

## 2013-08-21 NOTE — Patient Instructions (Signed)
Will make appt for abdominal ultrasound.

## 2013-08-21 NOTE — Progress Notes (Signed)
  Subjective:    Patient ID: [name] Rice, female    DOB: 1956-01-03, 57 y.o.   MRN: [id number]  HPI Patient is a 57 year old female who presents to the clinic to establish care. Patient's past medical history is extensive and listed in the problem list. Patient is a former smoker who quit in 1998. She has a family history positive for heart attack, depression, diabetes, high cholesterol and high blood pressure.  Her last mammogram was 2011. She is well aware she needs mammogram. She is schedule at the breast clinic however funds have been low and she's not been able to get done.  Tetanus shot was given 2012.  It has been 3 months since her last followup for diabetes. She does have significant neuropathy from diabetes. Her last BMP was 3 months ago and her kidney function look great. She denies any new symptoms. She continues to take her medications as directed.  She does have some left-sided abdominal pain. She has a history of multiple lipomas in her abdominal cavity. Per patient she has never had any imaging done. She did have surgery last year by Dr. Rayburn Ma [place]. He removed some of lipomas. She continues to have pain and occipital way on her left side. She denies any worsening pain other than with pressure. She denies any pain after eating. She does not have any acid reflux symptoms.    Review of Systems  All other systems reviewed and are negative.       Objective:   Physical Exam  Constitutional: She is oriented to person, place, and time. She appears well-developed and well-nourished.  Obese.  HENT:  Head: Normocephalic and atraumatic.  Cardiovascular: Normal rate, regular rhythm and normal heart sounds.   Pulmonary/Chest: Effort normal and breath sounds normal.  Abdominal:  Left sided multiple lipomas felt in the abdomen. Generalized tenderness over entire left side. No swelling, bruising or redness.  Neurological: She is alert and oriented to person, place,  and time.  Skin: Skin is dry.  Psychiatric: She has a normal mood and affect. Her behavior is normal.          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 Patient was encouraged that all look very good. Patient was given a prescription for her test strips and needles. Patient did not need any refills at this time. We'll followup in 3 months.   flu shot was given without complications today.  Multiple lipomas/left sided abdominal pain-will get ultrasound to evaluate these lipomas. Instructed patient she could try warm compresses and ibuprofen as needed. May need to send her to general surgery to get opinion if needed to remove or not.

## 2013-08-23 ENCOUNTER — Other Ambulatory Visit: Payer: Self-pay | Admitting: Physician Assistant

## 2013-08-28 ENCOUNTER — Other Ambulatory Visit: Payer: Medicare Other

## 2013-08-28 ENCOUNTER — Other Ambulatory Visit: Payer: Self-pay | Admitting: Internal Medicine

## 2013-08-30 ENCOUNTER — Other Ambulatory Visit: Payer: Self-pay | Admitting: *Deleted

## 2013-08-30 MED ORDER — INSULIN ASPART 100 UNIT/ML ~~LOC~~ SOLN: 60.0000 [IU] | Freq: Three times a day (TID) | SUBCUTANEOUS | Status: DC

## 2013-09-18 ENCOUNTER — Ambulatory Visit: Payer: Medicare Other | Admitting: Physician Assistant

## 2013-09-18 ENCOUNTER — Other Ambulatory Visit: Payer: Self-pay | Admitting: Physician Assistant

## 2013-09-18 ENCOUNTER — Telehealth: Payer: Self-pay

## 2013-09-18 DIAGNOSIS — Z79899 Other long term (current) drug therapy: Secondary | ICD-10-CM

## 2013-09-18 DIAGNOSIS — J449 Chronic obstructive pulmonary disease, unspecified: Secondary | ICD-10-CM

## 2013-09-18 DIAGNOSIS — J4489 Other specified chronic obstructive pulmonary disease: Secondary | ICD-10-CM

## 2013-09-18 DIAGNOSIS — M109 Gout, unspecified: Secondary | ICD-10-CM

## 2013-09-18 DIAGNOSIS — E119 Type 2 diabetes mellitus without complications: Secondary | ICD-10-CM

## 2013-09-18 DIAGNOSIS — IMO0002 Reserved for concepts with insufficient information to code with codable children: Secondary | ICD-10-CM

## 2013-09-18 MED ORDER — INSULIN ASPART 100 UNIT/ML ~~LOC~~ SOLN: 60.0000 [IU] | Freq: Three times a day (TID) | SUBCUTANEOUS | Status: DC

## 2013-09-18 MED ORDER — METHOCARBAMOL 500 MG PO TABS: 500.0000 mg | ORAL_TABLET | Freq: Three times a day (TID) | ORAL | Status: DC

## 2013-09-18 MED ORDER — PROPRANOLOL HCL 20 MG PO TABS: ORAL_TABLET | ORAL | Status: DC

## 2013-09-18 MED ORDER — ALLOPURINOL 300 MG PO TABS: 300.0000 mg | ORAL_TABLET | Freq: Every day | ORAL | Status: DC

## 2013-09-18 MED ORDER — HYDROCODONE-ACETAMINOPHEN 5-325 MG PO TABS: ORAL_TABLET | ORAL | Status: DC

## 2013-09-18 MED ORDER — METFORMIN HCL 1000 MG PO TABS: 1000.0000 mg | ORAL_TABLET | Freq: Two times a day (BID) | ORAL | Status: DC

## 2013-09-18 MED ORDER — ALBUTEROL SULFATE HFA 108 (90 BASE) MCG/ACT IN AERS: 2.0000 | INHALATION_SPRAY | Freq: Four times a day (QID) | RESPIRATORY_TRACT | Status: DC | PRN

## 2013-09-18 NOTE — Telephone Encounter (Signed)
Patient request refill for Hydrocodone acetaminophen and Robaxin. Ranveer Wahlstrom,CMA

## 2013-09-18 NOTE — Telephone Encounter (Signed)
Ok printed and will sign to be ready for fax.

## 2013-09-18 NOTE — Progress Notes (Signed)
Patient her to give urine drug screen. Patient did sign controlled substance contract.  Patient also request refill for Hydrocodone.[name] Cunningham,CMA

## 2013-09-19 LAB — DRUG SCR UR, PAIN MGMT, REFLEX CONF
Amphetamine Screen, Ur: NEGATIVE
Barbiturate Quant, Ur: NEGATIVE
Cocaine Metabolites: NEGATIVE
Creatinine,U: 24.92 mg/dL
Methadone: NEGATIVE
Propoxyphene: NEGATIVE

## 2013-09-23 LAB — OPIATES/OPIOIDS (LC/MS-MS)
Codeine Urine: NEGATIVE ng/mL
Heroin (6-AM), UR: NEGATIVE ng/mL
Hydromorphone: NEGATIVE ng/mL
Noroxycodone, Ur: NEGATIVE ng/mL
Oxycodone, ur: NEGATIVE ng/mL

## 2013-09-25 ENCOUNTER — Telehealth: Payer: Self-pay | Admitting: Physician Assistant

## 2013-09-25 ENCOUNTER — Ambulatory Visit (INDEPENDENT_AMBULATORY_CARE_PROVIDER_SITE_OTHER): Payer: Medicare Other

## 2013-09-25 ENCOUNTER — Other Ambulatory Visit: Payer: Self-pay | Admitting: *Deleted

## 2013-09-25 DIAGNOSIS — K7689 Other specified diseases of liver: Secondary | ICD-10-CM | POA: Diagnosis not present

## 2013-09-25 DIAGNOSIS — R109 Unspecified abdominal pain: Secondary | ICD-10-CM

## 2013-09-25 DIAGNOSIS — D171 Benign lipomatous neoplasm of skin and subcutaneous tissue of trunk: Secondary | ICD-10-CM

## 2013-09-25 DIAGNOSIS — J441 Chronic obstructive pulmonary disease with (acute) exacerbation: Secondary | ICD-10-CM

## 2013-09-25 MED ORDER — FLUTICASONE-SALMETEROL 250-50 MCG/DOSE IN AEPB: 1.0000 | INHALATION_SPRAY | Freq: Two times a day (BID) | RESPIRATORY_TRACT | Status: DC

## 2013-09-25 NOTE — Telephone Encounter (Signed)
The wrong script was called in. Please call in Advair.

## 2013-09-25 NOTE — Telephone Encounter (Signed)
Advair sent to pharmacy.  Meyer Cory, LPN

## 2013-09-26 ENCOUNTER — Encounter: Payer: Self-pay | Admitting: Physician Assistant

## 2013-09-26 DIAGNOSIS — K760 Fatty (change of) liver, not elsewhere classified: Secondary | ICD-10-CM | POA: Insufficient documentation

## 2013-10-15 DIAGNOSIS — E1142 Type 2 diabetes mellitus with diabetic polyneuropathy: Secondary | ICD-10-CM | POA: Diagnosis not present

## 2013-10-15 DIAGNOSIS — R109 Unspecified abdominal pain: Secondary | ICD-10-CM | POA: Diagnosis not present

## 2013-10-15 DIAGNOSIS — K5289 Other specified noninfective gastroenteritis and colitis: Secondary | ICD-10-CM | POA: Diagnosis not present

## 2013-10-15 DIAGNOSIS — M129 Arthropathy, unspecified: Secondary | ICD-10-CM | POA: Diagnosis not present

## 2013-10-15 DIAGNOSIS — F329 Major depressive disorder, single episode, unspecified: Secondary | ICD-10-CM | POA: Diagnosis not present

## 2013-10-15 DIAGNOSIS — N281 Cyst of kidney, acquired: Secondary | ICD-10-CM | POA: Diagnosis not present

## 2013-10-15 DIAGNOSIS — Z882 Allergy status to sulfonamides status: Secondary | ICD-10-CM | POA: Diagnosis not present

## 2013-10-15 DIAGNOSIS — Z87891 Personal history of nicotine dependence: Secondary | ICD-10-CM | POA: Diagnosis not present

## 2013-10-15 DIAGNOSIS — Z79899 Other long term (current) drug therapy: Secondary | ICD-10-CM | POA: Diagnosis not present

## 2013-10-15 DIAGNOSIS — G4730 Sleep apnea, unspecified: Secondary | ICD-10-CM | POA: Diagnosis not present

## 2013-10-15 DIAGNOSIS — I10 Essential (primary) hypertension: Secondary | ICD-10-CM | POA: Diagnosis not present

## 2013-10-15 DIAGNOSIS — J449 Chronic obstructive pulmonary disease, unspecified: Secondary | ICD-10-CM | POA: Diagnosis not present

## 2013-10-15 DIAGNOSIS — F3289 Other specified depressive episodes: Secondary | ICD-10-CM | POA: Diagnosis not present

## 2013-10-15 DIAGNOSIS — M5430 Sciatica, unspecified side: Secondary | ICD-10-CM | POA: Diagnosis not present

## 2013-10-15 DIAGNOSIS — Z883 Allergy status to other anti-infective agents status: Secondary | ICD-10-CM | POA: Diagnosis not present

## 2013-10-15 DIAGNOSIS — E1149 Type 2 diabetes mellitus with other diabetic neurological complication: Secondary | ICD-10-CM | POA: Diagnosis not present

## 2013-10-15 DIAGNOSIS — Z794 Long term (current) use of insulin: Secondary | ICD-10-CM | POA: Diagnosis not present

## 2013-10-15 DIAGNOSIS — K5730 Diverticulosis of large intestine without perforation or abscess without bleeding: Secondary | ICD-10-CM | POA: Diagnosis not present

## 2013-10-22 ENCOUNTER — Encounter: Payer: Self-pay | Admitting: Internal Medicine

## 2013-10-28 ENCOUNTER — Other Ambulatory Visit: Payer: Self-pay

## 2013-10-28 ENCOUNTER — Ambulatory Visit (INDEPENDENT_AMBULATORY_CARE_PROVIDER_SITE_OTHER): Payer: Medicare Other | Admitting: Physician Assistant

## 2013-10-28 ENCOUNTER — Encounter: Payer: Self-pay | Admitting: Physician Assistant

## 2013-10-28 VITALS — BP 161/75 | HR 89 | Wt 259.0 lb

## 2013-10-28 DIAGNOSIS — K5732 Diverticulitis of large intestine without perforation or abscess without bleeding: Secondary | ICD-10-CM | POA: Diagnosis not present

## 2013-10-28 DIAGNOSIS — E1149 Type 2 diabetes mellitus with other diabetic neurological complication: Secondary | ICD-10-CM

## 2013-10-28 DIAGNOSIS — L719 Rosacea, unspecified: Secondary | ICD-10-CM | POA: Diagnosis not present

## 2013-10-28 DIAGNOSIS — K5792 Diverticulitis of intestine, part unspecified, without perforation or abscess without bleeding: Secondary | ICD-10-CM

## 2013-10-28 MED ORDER — BRIMONIDINE TARTRATE 0.33 % EX GEL: CUTANEOUS | Status: DC

## 2013-10-28 MED ORDER — KETOROLAC TROMETHAMINE 60 MG/2ML IM SOLN
60.0000 mg | Freq: Once | INTRAMUSCULAR | Status: AC
  Administered 2013-10-28: 60 mg via INTRAMUSCULAR

## 2013-10-28 MED ORDER — HYDROCODONE-ACETAMINOPHEN 5-325 MG PO TABS: ORAL_TABLET | ORAL | Status: DC

## 2013-10-28 MED ORDER — MOXIFLOXACIN HCL 400 MG PO TABS: 400.0000 mg | ORAL_TABLET | Freq: Every day | ORAL | Status: DC

## 2013-10-28 NOTE — Patient Instructions (Addendum)
Probiotic- Align with antibiotic avelox.   Will refer to digestive health for colonoscopy.       Rosacea Rosacea is a long-term (chronic) condition that affects the skin of the face (cheeks, nose, brow, and chin) and sometimes the eyes. Rosacea causes the blood vessels near the surface of the skin to enlarge, resulting in redness. This condition usually begins after age 60. It occurs most often in light-skinned women. Without treatment, rosacea tends to get worse over time. There is no cure for rosacea, but treatment can help control your symptoms. CAUSES  The cause is unknown. It is thought that some people may inherit a tendency to develop rosacea. Certain triggers can make your rosacea worse, including:  Hot baths.  Exercise.  Sunlight.  Very hot or cold temperatures.  Hot or spicy foods and drinks.  Drinking alcohol.  Stress.  Taking blood pressure medicine.  Long-term use of topical steroids on the face. SYMPTOMS   Redness of the face.  Red bumps or pimples on the face.  Red, enlarged nose (rhinophyma).  Blushing easily.  Red lines on the skin.  Irritated or burning feeling in the eyes.  Swollen eyelids. DIAGNOSIS  Your caregiver can usually tell what is wrong by asking about your symptoms and performing a physical exam. TREATMENT  Avoiding triggers is an important part of treatment. You will also need to see a skin specialist (dermatologist) who can develop a treatment plan for you. The goals of treatment are to control your condition and to improve the appearance of your skin. It may take several weeks or months of treatment before you notice an improvement in your skin. Even after your skin improves, you will likely need to continue treatment to prevent your rosacea from coming back. Treatment methods may include:  Using sunscreen or sunblock daily to protect the skin.  Antibiotic medicine, such as metronidazole, applied directly to the skin.  Antibiotics  taken by mouth. This is usually prescribed if you have eye problems from your rosacea.  Laser surgery to improve the appearance of the skin. This surgery can reduce the appearance of red lines on the skin and can remove excess tissue from the nose to reduce its size. HOME CARE INSTRUCTIONS  Avoid things that seem to trigger your flare-ups.  If you are given antibiotics, take them as directed. Finish them even if you start to feel better.  Use a gentle facial cleanser that does not contain alcohol.  You may use a mild facial moisturizer.  Use a sunscreen or sunblock with SPF 30 or greater.  Wear a green-tinted foundation powder to conceal redness, if needed. Choose cosmetics that are noncomedogenic. This means they do not block your pores.  If your eyelids are affected, apply warm compresses to the eyelids. Do this up to 4 times a day or as directed by your caregiver. SEEK MEDICAL CARE IF:  Your skin problems get worse.  You feel depressed.  You lose your appetite.  You have trouble concentrating.  You have problems with your eyes, such as redness or itching. MAKE SURE YOU:  Understand these instructions.  Will watch your condition.  Will get help right away if you are not doing well or get worse. Document Released: 11/10/2004 Document Revised: 04/03/2012 Document Reviewed: 09/13/2011 [facility] Patient Information 2014 [place], [place].

## 2013-10-29 NOTE — Progress Notes (Signed)
   Subjective:    Patient ID: [name] Rice, female    DOB: 1956-03-19, 58 y.o.   MRN: [id number]  HPI Pt is a 58 yo female who presents to the clinic to follow up from hospital with dx of colitis. Pt had elevated neutrophils, normal WBC. CT showed left renal cyst and mild diverticulitis. Pt was treated with cipro per notes. Pt reports flagy also but did not see in documentation. Pt's abdominal pain resolved during treatment but has started back in the last 2-3 days. She has finished cipro. Pain extends from left flank into left lower quadrant. She denies any fever, chills, nausea or vomiting. Pt does have hx of diverticulitis. She has not had recurrence in over a year but admits to starting to eat more peanuts and apples. Does not have GI doctor. Last colonoscopy 5 years. No urinary symptoms. Some recent loose stools but denies any blood.  Pt has rosacea. Unsure of what she has been treated with but it didn't work. Would like to try something today.    Review of Systems     Objective:   Physical Exam  Constitutional: She is oriented to person, place, and time. She appears well-developed and well-nourished.  Overweight.   HENT:  Head: Normocephalic and atraumatic.  Cardiovascular: Normal rate, regular rhythm and normal heart sounds.   Pulmonary/Chest: Effort normal and breath sounds normal. She has no wheezes.  Abdominal: Soft. Bowel sounds are normal.  Tenderness to palpation over lower left quadrant.   Neurological: She is alert and oriented to person, place, and time.  Skin: Skin is warm and dry.  Erythematous patches on cheeks, chin, forehead a few pustules on patches of redness.   Psychiatric: She has a normal mood and affect. Her behavior is normal.          Assessment & Plan:  Diverticulitis- Pt symptoms coming back is presumptive for diverticulitis. Concerned is that she was not fully treated if did not receive metronidazole. Will treat with avelox. Will refer to digestive  healthy for colonoscopy and evaluation. I think with recurrence needs to be seen by GI. Call if symptoms worsening before GI appt. Pt is on regular hydrocodone for ongoing pain. Consider probiotic for good bacteria growth especially while on abx but even after.   roscea- Per pt been treated in the past with gels and did not work. Unsure of what was tried. Gave pt samples and rx or miravso to try qd. Also gave HO for symptomatic care or things to avoid that might help.

## 2013-10-30 ENCOUNTER — Other Ambulatory Visit: Payer: Self-pay | Admitting: Physician Assistant

## 2013-11-22 ENCOUNTER — Ambulatory Visit (INDEPENDENT_AMBULATORY_CARE_PROVIDER_SITE_OTHER): Payer: Medicare Other | Admitting: Physician Assistant

## 2013-11-22 VITALS — BP 108/56 | HR 80 | Wt 262.0 lb

## 2013-11-22 DIAGNOSIS — K219 Gastro-esophageal reflux disease without esophagitis: Secondary | ICD-10-CM | POA: Diagnosis not present

## 2013-11-22 DIAGNOSIS — I10 Essential (primary) hypertension: Secondary | ICD-10-CM

## 2013-11-22 DIAGNOSIS — G4733 Obstructive sleep apnea (adult) (pediatric): Secondary | ICD-10-CM

## 2013-11-22 DIAGNOSIS — E1149 Type 2 diabetes mellitus with other diabetic neurological complication: Secondary | ICD-10-CM

## 2013-11-22 DIAGNOSIS — F32A Depression, unspecified: Secondary | ICD-10-CM

## 2013-11-22 DIAGNOSIS — F329 Major depressive disorder, single episode, unspecified: Secondary | ICD-10-CM

## 2013-11-22 DIAGNOSIS — R143 Flatulence: Secondary | ICD-10-CM

## 2013-11-22 DIAGNOSIS — F3289 Other specified depressive episodes: Secondary | ICD-10-CM | POA: Diagnosis not present

## 2013-11-22 DIAGNOSIS — R141 Gas pain: Secondary | ICD-10-CM

## 2013-11-22 DIAGNOSIS — R142 Eructation: Secondary | ICD-10-CM

## 2013-11-22 DIAGNOSIS — R140 Abdominal distension (gaseous): Secondary | ICD-10-CM

## 2013-11-22 DIAGNOSIS — Z9989 Dependence on other enabling machines and devices: Secondary | ICD-10-CM

## 2013-11-22 DIAGNOSIS — R1032 Left lower quadrant pain: Secondary | ICD-10-CM

## 2013-11-22 DIAGNOSIS — E785 Hyperlipidemia, unspecified: Secondary | ICD-10-CM

## 2013-11-22 DIAGNOSIS — Z8719 Personal history of other diseases of the digestive system: Secondary | ICD-10-CM

## 2013-11-22 LAB — POCT GLYCOSYLATED HEMOGLOBIN (HGB A1C): Hemoglobin A1C: 8.1

## 2013-11-22 MED ORDER — DULOXETINE HCL 20 MG PO CPEP: ORAL_CAPSULE | ORAL | Status: DC

## 2013-11-22 MED ORDER — TRIAMCINOLONE ACETONIDE 0.1 % EX CREA: 1.0000 "application " | TOPICAL_CREAM | Freq: Two times a day (BID) | CUTANEOUS | Status: DC

## 2013-11-22 MED ORDER — FUROSEMIDE 40 MG PO TABS: 40.0000 mg | ORAL_TABLET | Freq: Every day | ORAL | Status: DC

## 2013-11-22 MED ORDER — PANTOPRAZOLE SODIUM 40 MG PO TBEC: 40.0000 mg | DELAYED_RELEASE_TABLET | Freq: Every day | ORAL | Status: DC

## 2013-11-22 MED ORDER — MONTELUKAST SODIUM 10 MG PO TABS: 10.0000 mg | ORAL_TABLET | Freq: Every day | ORAL | Status: DC

## 2013-11-22 MED ORDER — HYDROCODONE-ACETAMINOPHEN 5-325 MG PO TABS: ORAL_TABLET | ORAL | Status: DC

## 2013-11-22 MED ORDER — PRAVASTATIN SODIUM 40 MG PO TABS: 40.0000 mg | ORAL_TABLET | Freq: Every day | ORAL | Status: DC

## 2013-11-22 MED ORDER — GABAPENTIN 600 MG PO TABS: ORAL_TABLET | ORAL | Status: DC

## 2013-11-22 NOTE — Progress Notes (Signed)
Subjective:    Patient ID: [name] Rice, female    DOB: [birth year], 58 y.o.   MRN: [id number]  HPI Pt presents to the clinic for refills and follow up.   DM- not doing well. Checking sugars and all over the place. IN am can be 130-200. At mealtime can be 80-180. neurpathy in hands and feets seems worse. Eating a lot of chocolate candy. Not taking medication or insulin regularly. Not exercising. Laying down more than getting up. Denies any hypoglycemic events. Having a lot of bloating, abdominal fullness, LLQ pain especially after eating. Nothing seems to help. GERD medication does not help and taking regularly.no diarrhea or constipation. Treated 2 times for Diverticulitis got better but then symptoms start up again.   HTN- doing well. No CP, palpitiations, vision changes, HA's.   Depression- well controlled no complaints.   Low back pain- pain continues and radiates into bilateral legs. Xrays done 2012. MRI negative in 2001. PT failed. Taking hydrocodone up to twice a day. Neurotonin 300 TID. She just wants to lay around.   Scaling skin around scalp and face treated with triamcinolone doing well. Needs refill.   OSA- CPAP broke. Needs referral to get fixed.       Review of Systems     Objective:   Physical Exam  Constitutional: She is oriented to person, place, and time. She appears well-developed and well-nourished.  Obesity.   HENT:  Head: Normocephalic and atraumatic.  Cardiovascular: Normal rate, regular rhythm and normal heart sounds.   Pulmonary/Chest: Effort normal and breath sounds normal. She has no wheezes.  Abdominal: Soft. Bowel sounds are normal.  Tenderness over LLQ. No rebound. Exam limited by obesity.   Neurological: She is alert and oriented to person, place, and time.  Skin: Skin is dry.  Psychiatric: She has a normal mood and affect. Her behavior is normal.          Assessment & Plan:  DM, uncontrolled- AIC is 8.1 this is up from the 6.5 at last  visit. Pt admits to forgetting medication and eating a lot of chocolate. No med changes made today. She is checking her glucose before insulin. Discussed at 8.5 she is likely having symptoms and could be why neuropathy in extremities is worsening. Talked about staying active and moving every day. Will follow up in 3 months if no change will then adjust medication. I feel like if taking medication regularly will improve numbers as well.   Bloating/burping/fullness after eating/GERD- pt is on medication for GERD. Refilled today. Symptoms sound like gastroparesis. I gave HO for pt to review and some symptomatic/consevative things to do. Pt has GI doctor. INstructed her to get f/u appt with him. She has not had EGD in over 10 years and could be a good time to repeat. EGD could also confirm gastroparesis. Eating smaller meals in size, chew food well was also discussed.   Continued Left lower quadrant pain- will recheck with CT of abdomen for any signs of diverticulitis. I suggest colonoscopy when follow up with GI specialist.   HTN- BP great today. Refilled medications.   Depression-refilled medications today.   Low back pain with radiculopathy- Xrays done in 2012 no acute findings. MRI in 2001 which was negative.  Increased neurotin to 600mg  BID up to TID. PT did not help. Consider seeing partner Dr. Darene Lamer for further work up if not improving.   seborrheic dermatitis- refilled triamcinolone.    OSA- Will get order for reevaluation of CPAP  to [facility].    Spent over 45 minutes with patient and greater than 50 percent of visit spent counseling pt on treatment plan of above dx.

## 2013-11-22 NOTE — Patient Instructions (Addendum)
Repeat CT scan. Gastroparesis  Gastroparesis is also called slowed stomach emptying (delayed gastric emptying). It is a condition in which the stomach takes too long to empty its contents. It often happens in people with diabetes.  CAUSES  Gastroparesis happens when nerves to the stomach are damaged or stop working. When the nerves are damaged, the muscles of the stomach and intestines do not work normally. The movement of food is slowed or stopped. High blood glucose (sugar) causes changes in nerves and can damage the blood vessels that carry oxygen and nutrients to the nerves. RISK FACTORS  Diabetes.  Post-viral syndromes.  Eating disorders (anorexia, bulimia).  Surgery on the stomach or vagus nerve.  Gastroesophageal reflux disease (rarely).  Smooth muscle disorders (amyloidosis, scleroderma).  Metabolic disorders, including hypothyroidism.  Parkinson disease. SYMPTOMS   Heartburn.  Feeling sick to your stomach (nausea).  Vomiting of undigested food.  An early feeling of fullness when eating.  Weight loss.  Abdominal bloating.  Erratic blood glucose levels.  Lack of appetite.  Gastroesophageal reflux.  Spasms of the stomach wall. Complications can include:  Bacterial overgrowth in stomach. Food stays in the stomach and can ferment and cause bacteria to grow.  Weight loss due to difficulty digesting and absorbing nutrients.  Vomiting.  Obstruction in the stomach. Undigested food can harden and cause nausea and vomiting.  Blood glucose fluctuations caused by inconsistent food absorption. DIAGNOSIS  The diagnosis of gastroparesis is confirmed through one or more of the following tests:  Barium X-rays and scans. These tests look at how long it takes for food to move through the stomach.  Gastric manometry. This test measures electrical and muscular activity in the stomach. A thin tube is passed down the throat into the stomach. The tube contains a wire that  takes measurements of the stomach's electrical and muscular activity as it digests liquids and solid food.  Endoscopy. This procedure is done with a long, thin tube called an endoscope. It is passed through the mouth and gently down the esophagus into the stomach. This tube helps the caregiver look at the lining of the stomach to check for any abnormalities.  Ultrasonography. This can rule out gallbladder disease or pancreatitis. This test will outline and define the shape of the gallbladder and pancreas. TREATMENT   Treatments may include:  Exercise.  Medicines to control nausea and vomiting.  Medicines to stimulate stomach muscles.  Changes in what and when you eat.  Having smaller meals more often.  Eating low-fiber forms of high-fiber foods, such as eating cooked vegetables instead of raw vegetables.  Eating low-fat foods.  Consuming liquids, which are easier to digest.  In severe cases, feeding tubes and intravenous (IV) feeding may be needed. It is important to note that in most cases, treatment does not cure gastroparesis. It is usually a lasting (chronic) condition. Treatment helps you manage the underlying condition so that you can be as healthy and comfortable as possible. Other treatments  A gastric neurostimulator has been developed to assist people with gastroparesis. The battery-operated device is surgically implanted. It emits mild electrical pulses to help improve stomach emptying and to control nausea and vomiting.  The use of botulinum toxin has been shown to improve stomach emptying by decreasing the prolonged contractions of the muscle between the stomach and the small intestine (pyloric sphincter). The benefits are temporary. SEEK MEDICAL CARE IF:   You have diabetes and you are having problems keeping your blood glucose in goal range.  You  are having nausea, vomiting, bloating, or early feelings of fullness with eating.  Your symptoms do not change with a  change in diet. Document Released: 10/03/2005 Document Revised: 01/28/2013 Document Reviewed: 03/12/2009 [facility] Patient Information 2014 [place], [place].

## 2013-11-25 ENCOUNTER — Telehealth: Payer: Self-pay | Admitting: *Deleted

## 2013-11-25 MED ORDER — AMBULATORY NON FORMULARY MEDICATION: Status: DC

## 2013-11-25 NOTE — Telephone Encounter (Signed)
No PA required for CT ABD/Pelvis.  Oscar La, LPN

## 2013-11-26 ENCOUNTER — Ambulatory Visit (HOSPITAL_BASED_OUTPATIENT_CLINIC_OR_DEPARTMENT_OTHER)
Admission: RE | Admit: 2013-11-26 | Discharge: 2013-11-26 | Disposition: A | Discharge status: Home / Self Care | Payer: Medicare Other | Source: Ambulatory Visit | Attending: Physician Assistant | Admitting: Physician Assistant

## 2013-11-26 ENCOUNTER — Encounter (HOSPITAL_BASED_OUTPATIENT_CLINIC_OR_DEPARTMENT_OTHER): Payer: Self-pay

## 2013-11-26 DIAGNOSIS — R109 Unspecified abdominal pain: Secondary | ICD-10-CM | POA: Insufficient documentation

## 2013-11-26 DIAGNOSIS — K561 Intussusception: Secondary | ICD-10-CM | POA: Insufficient documentation

## 2013-11-26 DIAGNOSIS — R112 Nausea with vomiting, unspecified: Secondary | ICD-10-CM | POA: Insufficient documentation

## 2013-11-26 DIAGNOSIS — N281 Cyst of kidney, acquired: Secondary | ICD-10-CM | POA: Diagnosis not present

## 2013-11-26 MED ORDER — IOHEXOL 300 MG/ML  SOLN
100.0000 mL | Freq: Once | INTRAMUSCULAR | Status: AC | PRN
  Administered 2013-11-26: 100 mL via INTRAVENOUS

## 2013-11-27 ENCOUNTER — Encounter: Payer: Self-pay | Admitting: Internal Medicine

## 2013-11-27 ENCOUNTER — Other Ambulatory Visit: Payer: Self-pay | Admitting: Physician Assistant

## 2013-11-27 DIAGNOSIS — K5792 Diverticulitis of intestine, part unspecified, without perforation or abscess without bleeding: Secondary | ICD-10-CM | POA: Insufficient documentation

## 2013-11-27 MED ORDER — MOXIFLOXACIN HCL 400 MG PO TABS: 400.0000 mg | ORAL_TABLET | Freq: Every day | ORAL | Status: DC

## 2013-12-16 ENCOUNTER — Other Ambulatory Visit: Payer: Self-pay | Admitting: Physician Assistant

## 2013-12-16 ENCOUNTER — Telehealth: Payer: Self-pay

## 2013-12-16 DIAGNOSIS — G4733 Obstructive sleep apnea (adult) (pediatric): Secondary | ICD-10-CM

## 2013-12-16 MED ORDER — FLUCONAZOLE 150 MG PO TABS: 150.0000 mg | ORAL_TABLET | Freq: Once | ORAL | Status: DC

## 2013-12-16 NOTE — Telephone Encounter (Signed)
Sent diflucan. Should be hearing from respiratory soon about cpap visit.

## 2013-12-16 NOTE — Telephone Encounter (Signed)
1- [name] Rice has vaginal itching. Denies pelvic pain, fever, chills or sweats. She states [name] Rice usually calls in [name] Rice for her. Please advise.    2- [name] Rice is also waiting for [name] Rice to come by to fix her C-PAP. She states [name] Rice was going to have someone call and set this up.

## 2013-12-17 NOTE — Telephone Encounter (Signed)
Left detailed message.   

## 2013-12-25 ENCOUNTER — Other Ambulatory Visit: Payer: Self-pay | Admitting: Physician Assistant

## 2013-12-25 MED ORDER — HYDROCODONE-ACETAMINOPHEN 5-325 MG PO TABS: ORAL_TABLET | ORAL | Status: DC

## 2013-12-30 ENCOUNTER — Ambulatory Visit: Payer: Medicare Other | Admitting: Internal Medicine

## 2013-12-30 ENCOUNTER — Telehealth: Payer: Self-pay | Admitting: Internal Medicine

## 2014-01-14 ENCOUNTER — Other Ambulatory Visit: Payer: Self-pay | Admitting: Physician Assistant

## 2014-01-22 ENCOUNTER — Other Ambulatory Visit: Payer: Self-pay | Admitting: *Deleted

## 2014-01-22 MED ORDER — HYDROCODONE-ACETAMINOPHEN 5-325 MG PO TABS: ORAL_TABLET | ORAL | Status: DC

## 2014-01-23 ENCOUNTER — Other Ambulatory Visit: Payer: Self-pay

## 2014-02-12 ENCOUNTER — Other Ambulatory Visit: Payer: Self-pay | Admitting: Physician Assistant

## 2014-02-13 ENCOUNTER — Other Ambulatory Visit: Payer: Self-pay | Admitting: Physician Assistant

## 2014-02-14 NOTE — Telephone Encounter (Signed)
Patient rescheduled for 03/26/2014

## 2014-02-19 ENCOUNTER — Ambulatory Visit: Payer: Medicare Other | Admitting: Internal Medicine

## 2014-02-19 ENCOUNTER — Ambulatory Visit: Payer: Medicare Other | Admitting: Physician Assistant

## 2014-02-21 ENCOUNTER — Encounter: Payer: Self-pay | Admitting: Physician Assistant

## 2014-02-21 ENCOUNTER — Ambulatory Visit (INDEPENDENT_AMBULATORY_CARE_PROVIDER_SITE_OTHER): Payer: Medicare Other | Admitting: Physician Assistant

## 2014-02-21 VITALS — BP 108/62 | HR 78 | Wt 264.0 lb

## 2014-02-21 DIAGNOSIS — E119 Type 2 diabetes mellitus without complications: Secondary | ICD-10-CM

## 2014-02-21 DIAGNOSIS — IMO0002 Reserved for concepts with insufficient information to code with codable children: Secondary | ICD-10-CM

## 2014-02-21 DIAGNOSIS — E785 Hyperlipidemia, unspecified: Secondary | ICD-10-CM | POA: Diagnosis not present

## 2014-02-21 DIAGNOSIS — Z79899 Other long term (current) drug therapy: Secondary | ICD-10-CM

## 2014-02-21 LAB — POCT GLYCOSYLATED HEMOGLOBIN (HGB A1C): Hemoglobin A1C: 8

## 2014-02-21 MED ORDER — EMPAGLIFLOZIN-LINAGLIPTIN 10-5 MG PO TABS: 1.0000 | ORAL_TABLET | Freq: Every day | ORAL | Status: DC

## 2014-02-21 MED ORDER — HYDROCODONE-ACETAMINOPHEN 5-325 MG PO TABS: ORAL_TABLET | ORAL | Status: DC

## 2014-02-21 MED ORDER — AMBULATORY NON FORMULARY MEDICATION: Status: DC

## 2014-02-24 NOTE — Progress Notes (Signed)
   Subjective:    Patient ID: [name] Rice, female    DOB: 09/19/1956, 58 y.o.   MRN: [id number]  HPI Pt presents to the clinic to follow up on diabetes.   DM-pt's sugars are not controlled. AM fasting sugars are 200, meal time 200-300. She is taking insulin at every meal and lantus twice a day. She continues to eat high sugar and high carb meals. She is not exercising. UPT eye exam oct 2014. Denies any hypoglyemia. No open sores or unhealed wounds.   Chronic back pain- needs refill on norco.   Dyslipidemia- continues on pravachol. Lipid level not checked recently. Admits to not upholding a low fat diet.   OSA- pt did get new CPAP for last month. She does feel like she is resting better but seems to be drying her out more.    Review of Systems     Objective:   Physical Exam  Constitutional: She appears well-developed and well-nourished.  Obese.   HENT:  Head: Normocephalic and atraumatic.  Right Ear: External ear normal.  Left Ear: External ear normal.  Cardiovascular: Normal rate, regular rhythm and normal heart sounds.   Pulmonary/Chest: Effort normal and breath sounds normal. She has no wheezes.  Psychiatric: She has a normal mood and affect. Her behavior is normal.          Assessment & Plan:  DM- Uncontrolled Lab Results  Component Value Date   HGBA1C 8.0 02/21/2014  continue Lantus, novolog, metformin. Will add glyxambi 10-5. Gave samples and rx for refills. UPT on eye exam. Encouraged watching diet as far as sugars and carbs.   OSA- if having problems with dryness and mouth piece call CPAP company.   HTN- refilled medications. BP stable.  Chronic pain- refilled vicodin for another month.    Colonoscopy and mammogram scheduled.

## 2014-03-12 ENCOUNTER — Telehealth: Payer: Self-pay | Admitting: *Deleted

## 2014-03-12 NOTE — Telephone Encounter (Signed)
PA approved for Glyxambi through [facility] until 10/16/14.

## 2014-03-14 ENCOUNTER — Other Ambulatory Visit: Payer: Self-pay | Admitting: Physician Assistant

## 2014-03-19 ENCOUNTER — Other Ambulatory Visit: Payer: Self-pay | Admitting: *Deleted

## 2014-03-19 MED ORDER — AMBULATORY NON FORMULARY MEDICATION: Status: DC

## 2014-03-20 ENCOUNTER — Other Ambulatory Visit: Payer: Self-pay | Admitting: *Deleted

## 2014-03-20 MED ORDER — HYDROCODONE-ACETAMINOPHEN 5-325 MG PO TABS: ORAL_TABLET | ORAL | Status: DC

## 2014-03-25 ENCOUNTER — Ambulatory Visit (INDEPENDENT_AMBULATORY_CARE_PROVIDER_SITE_OTHER): Payer: Medicare Other

## 2014-03-25 ENCOUNTER — Other Ambulatory Visit: Payer: Self-pay | Admitting: Physician Assistant

## 2014-03-25 DIAGNOSIS — Z79899 Other long term (current) drug therapy: Secondary | ICD-10-CM | POA: Diagnosis not present

## 2014-03-25 DIAGNOSIS — Z1231 Encounter for screening mammogram for malignant neoplasm of breast: Secondary | ICD-10-CM | POA: Diagnosis not present

## 2014-03-25 DIAGNOSIS — E785 Hyperlipidemia, unspecified: Secondary | ICD-10-CM | POA: Diagnosis not present

## 2014-03-25 DIAGNOSIS — E119 Type 2 diabetes mellitus without complications: Secondary | ICD-10-CM | POA: Diagnosis not present

## 2014-03-25 DIAGNOSIS — Z1239 Encounter for other screening for malignant neoplasm of breast: Secondary | ICD-10-CM

## 2014-03-26 ENCOUNTER — Ambulatory Visit (INDEPENDENT_AMBULATORY_CARE_PROVIDER_SITE_OTHER): Payer: Medicare Other | Admitting: Internal Medicine

## 2014-03-26 ENCOUNTER — Encounter: Payer: Self-pay | Admitting: Internal Medicine

## 2014-03-26 VITALS — BP 124/60 | HR 76 | Ht 63.25 in | Wt 260.5 lb

## 2014-03-26 DIAGNOSIS — K5792 Diverticulitis of intestine, part unspecified, without perforation or abscess without bleeding: Secondary | ICD-10-CM

## 2014-03-26 DIAGNOSIS — R933 Abnormal findings on diagnostic imaging of other parts of digestive tract: Secondary | ICD-10-CM

## 2014-03-26 DIAGNOSIS — K219 Gastro-esophageal reflux disease without esophagitis: Secondary | ICD-10-CM | POA: Diagnosis not present

## 2014-03-26 DIAGNOSIS — R1012 Left upper quadrant pain: Secondary | ICD-10-CM

## 2014-03-26 DIAGNOSIS — K5732 Diverticulitis of large intestine without perforation or abscess without bleeding: Secondary | ICD-10-CM | POA: Diagnosis not present

## 2014-03-26 LAB — LIPID PANEL
CHOLESTEROL: 137 mg/dL (ref 0–200)
HDL: 43 mg/dL (ref >39)
LDL Cholesterol: 68 mg/dL (ref 0–99)
Total CHOL/HDL Ratio: 3.2 Ratio
Triglycerides: 128 mg/dL (ref <150)
VLDL: 26 mg/dL (ref 0–40)

## 2014-03-26 LAB — COMPLETE METABOLIC PANEL WITH GFR
ALT: 28 U/L (ref 0–35)
AST: 20 U/L (ref 0–37)
Albumin: 4.1 g/dL (ref 3.5–5.2)
Alkaline Phosphatase: 86 U/L (ref 39–117)
BUN: 19 mg/dL (ref 6–23)
CALCIUM: 9.4 mg/dL (ref 8.4–10.5)
CHLORIDE: 101 meq/L (ref 96–112)
CO2: 26 mEq/L (ref 19–32)
CREATININE: 1.04 mg/dL (ref 0.50–1.10)
GFR, EST NON AFRICAN AMERICAN: 59 mL/min — AB
GFR, Est African American: 67 mL/min
Glucose, Bld: 183 mg/dL — ABNORMAL HIGH (ref 70–99)
POTASSIUM: 3.9 meq/L (ref 3.5–5.3)
Sodium: 139 mEq/L (ref 135–145)
Total Bilirubin: 0.6 mg/dL (ref 0.2–1.2)
Total Protein: 7.2 g/dL (ref 6.0–8.3)

## 2014-03-26 NOTE — Progress Notes (Signed)
HISTORY OF PRESENT ILLNESS:  [name] Rice is a very pleasant 58 y.o. female with multiple significant medical problems including morbid obesity, COPD, asthma, poorly controlled diabetes mellitus, hypertension, hyperlipidemia, chronic back pain, osteoarthritis, nephrolithiasis, sleep apnea on CPAP, and diverticulosis complicated by diverticulitis. She is sent by her primary provider regarding problems with abdominal pain and abnormal CT scan. Patient has undergone multiple prior colonoscopies including 1998, 2004, and most recently in 2010. Most recent colonoscopies with hyperplastic polyps only. Sigmoid diverticulosis present. She also has a history of GERD for which she is on pantoprazole. Previous upper endoscopy in 2004 was normal. Since she was last evaluated here, she was hospitalized November 2013 with sigmoid diverticulitis. She had an additional flare about a year later which was treated as outpatient. Most recently, was seen in February for recurrent left-sided pain. A CT scan of the abdomen and pelvis was obtained (reviewed) which suggested mild acute diverticulitis as well as short intussusception in that area. She does report chronic superficial left upper quadrant pain. She has had this for years. Previously had a lipoma removed from that area without improvement in pain. Currently, her bowel habits are regular. No bleeding. She has received several courses of antibiotics when she has had diverticulitis. Currently, states she is feeling much better with no left lower cautery pain. GI review of systems is otherwise remarkable for increased bloating with gas and belching as well as occasional fecal incontinence. Review of laboratory shows last hemoglobin A1c was 8.0. Hemoglobin one year ago was normal at 13.3. Overall she has poor exercise tolerance and limited mobility  REVIEW OF SYSTEMS:  All non-GI ROS negative except for sinus and allergy, arthritis, back pain, depression, fatigue, headaches,  muscle cramps, shortness of breath, skin rash, insomnia, ankle edema, increased thirst, urinary leakage  Past Medical History  Diagnosis Date  . Anxiety   . Arthritis   . Asthma     no PFTs  . COPD (chronic obstructive pulmonary disease)     no PFTs  . Depression   . Diabetes mellitus   . GERD (gastroesophageal reflux disease)   . Hypertension   . HLD (hyperlipidemia)   . Dizziness   . History of colonic polyps     hyperplastic  . Lumbar pain 2011    per x-ray - Multilevel spondylosis  . Cervical dystonia   . Tremor [place]  . Torn rotator cuff RT SHOULDER  . Diarrhea   . History of kidney stones   . Recurrent boils     "SLUSTER BOILS" ON ABD AND LABIA  . History of seborrheic dermatitis     face,ears  . OSA on CPAP 2008  . Sleep apnea     USES C-PAP  . Diverticulosis   . Diverticulitis   . Pneumonia     Past Surgical History  Procedure Laterality Date  . Pilonidal cyst excision  1324,4010  . Tubal ligation    . Lumbar laminectomy    . Carpal tunnel release Left 2006  . Elbow surgery Left   . Knee arthroscopy Left   . Nasal sinus surgery    . Hand surgery Right RIGHT  . Joint replacement Right     shoulder  . Mass excision  06/28/2012    Procedure: EXCISION MASS;  Surgeon: Harl Bowie, MD;  Location: [facility];  Service: General;  Laterality: Left;  excision left upper quadrant abdominal wall mass, Benign Lipoma    Social History [name] Rice  reports that  she quit smoking about 17 years ago. Her smoking use included Cigarettes. She smoked 0.00 packs per day. She has never used smokeless tobacco. She reports that she does not drink alcohol or use illicit drugs.  family history includes Depression in her brother, brother, and sister; Diabetes in her brother, brother, mother, and sister; Heart attack in her brother and brother; Heart disease in her brother; Hyperlipidemia in her brother, brother, mother, and sister; Hypertension in her  brother, brother, mother, and sister; Kidney cancer in her brother; Kidney disease in her mother; Stroke in her sister. There is no history of Cancer.  Allergies  Allergen Reactions  . Ace Inhibitors Other (See Comments)    Causes blood pressure to drop rapidly  . Cefaclor Hives and Itching  . Shrimp [Shellfish Allergy] Nausea And Vomiting  . Sulfonamide Derivatives Rash       PHYSICAL EXAMINATION: Vital signs: BP 124/60  Pulse 76  Ht 5' 3.25" (1.607 m)  Wt 260 lb 8 oz (118.162 kg)  BMI 45.76 kg/m2  LMP 10/18/1995  Constitutional: Morbidly obese, unhealthy and chronically ill -appearing, no acute distress Psychiatric: alert and oriented x3, cooperative Eyes: extraocular movements intact, anicteric, conjunctiva pink Mouth: oral pharynx moist, no lesions Neck: supple no lymphadenopathy Cardiovascular: heart regular rate and rhythm, no murmur Lungs: clear to auscultation bilaterally Abdomen: soft, obese, superficial abdominal wall tenderness left upper quadrant, nondistended, no obvious ascites, no peritoneal signs, normal bowel sounds, no organomegaly Rectal: Omitted (precluded by body habitus) and immobility Extremities: 1+ lower extremity edema bilaterally Skin: no lesions on visible extremities Neuro: No focal deficits.   ASSESSMENT:  #1. Recurrent diverticulitis. Clinically resolved. Abnormal CT scan in February as described #2. Chronic left upper quadrant pain abdominal wall in nature #3. Multiple prior colonoscopies. Last exam 2010. Hyperplastic colon polyps only #4. GERD. Classic symptoms controlled on PPI #5. Dyspepsia with gas and bloating #6. MULTIPLE SIGNIFICANT medical problems   PLAN:  #1. At this point, I think it would be most prudent to proceed with repeat CT scan of the abdomen and pelvis. This to demonstrate improvement or resolution of previously noted problems in February. I do not think colonoscopy is absolutely necessary in this patient. Furthermore, I  think the risk of colonoscopy in this patient is prohibitive given her comorbidities. #2. Reassured regarding dyspeptic symptoms and gas #3. Continue PPI for GERD #4. Ongoing general medical care with her PCP for multiple significant medical problems

## 2014-03-26 NOTE — Patient Instructions (Signed)
You have been scheduled for a CT scan of the abdomen and pelvis at [facility] ([address]---this is in the same building as [facility]).   You are scheduled on 03/31/2014 at 9:00am. You should arrive 15 minutes prior to your appointment time for registration. Please follow the written instructions below on the day of your exam:  WARNING: IF YOU ARE ALLERGIC TO IODINE/X-RAY DYE, PLEASE NOTIFY RADIOLOGY IMMEDIATELY AT [phone]! YOU WILL BE GIVEN A 13 HOUR PREMEDICATION PREP.  1) Do not eat or drink anything after 5j:00am (4 hours prior to your test) 2) You have been given 2 bottles of oral contrast to drink. The solution may taste better if refrigerated, but do NOT add ice or any other liquid to this solution. Shake well before drinking.    Drink 1 bottle of contrast @ 7:00am (2 hours prior to your exam)  Drink 1 bottle of contrast @ 8:00am (1 hour prior to your exam)  You may take any medications as prescribed with a small amount of water except for the following: Metformin, Glucophage, Glucovance, Avandamet, Riomet, Fortamet, Actoplus Met, Janumet, Glumetza or Metaglip. The above medications must be held the day of the exam AND 48 hours after the exam.  The purpose of you drinking the oral contrast is to aid in the visualization of your intestinal tract. The contrast solution may cause some diarrhea. Before your exam is started, you will be given a small amount of fluid to drink. Depending on your individual set of symptoms, you may also receive an intravenous injection of x-ray contrast/dye. Plan on being at [facility] for 30 minutes or long, depending on the type of exam you are having performed.  If you have any questions regarding your exam or if you need to reschedule, you may call the CT department at [phone] between the hours of 8:00 am and 5:00 pm, Monday-Friday.  ________________________________________________________________________

## 2014-03-27 ENCOUNTER — Other Ambulatory Visit: Payer: Self-pay | Admitting: Physician Assistant

## 2014-03-28 ENCOUNTER — Other Ambulatory Visit: Payer: Self-pay

## 2014-03-28 MED ORDER — GLUCOSE BLOOD VI STRP: 1.0000 | ORAL_STRIP | Freq: Three times a day (TID) | Status: DC

## 2014-03-31 ENCOUNTER — Ambulatory Visit (INDEPENDENT_AMBULATORY_CARE_PROVIDER_SITE_OTHER)
Admission: RE | Admit: 2014-03-31 | Discharge: 2014-03-31 | Disposition: A | Discharge status: Home / Self Care | Payer: Medicare Other | Source: Ambulatory Visit | Attending: Internal Medicine | Admitting: Internal Medicine

## 2014-03-31 ENCOUNTER — Other Ambulatory Visit: Payer: Self-pay | Admitting: *Deleted

## 2014-03-31 DIAGNOSIS — K5732 Diverticulitis of large intestine without perforation or abscess without bleeding: Secondary | ICD-10-CM | POA: Diagnosis not present

## 2014-03-31 DIAGNOSIS — K5792 Diverticulitis of intestine, part unspecified, without perforation or abscess without bleeding: Secondary | ICD-10-CM

## 2014-03-31 DIAGNOSIS — K5730 Diverticulosis of large intestine without perforation or abscess without bleeding: Secondary | ICD-10-CM | POA: Diagnosis not present

## 2014-03-31 MED ORDER — IOHEXOL 300 MG/ML  SOLN
100.0000 mL | Freq: Once | INTRAMUSCULAR | Status: AC | PRN
  Administered 2014-03-31: 100 mL via INTRAVENOUS

## 2014-03-31 MED ORDER — AMBULATORY NON FORMULARY MEDICATION: Status: DC

## 2014-04-01 ENCOUNTER — Other Ambulatory Visit: Payer: Self-pay | Admitting: *Deleted

## 2014-04-02 ENCOUNTER — Other Ambulatory Visit: Payer: Self-pay | Admitting: Physician Assistant

## 2014-04-07 ENCOUNTER — Other Ambulatory Visit: Payer: Self-pay | Admitting: Physician Assistant

## 2014-04-10 ENCOUNTER — Other Ambulatory Visit: Payer: Self-pay

## 2014-04-10 DIAGNOSIS — K5732 Diverticulitis of large intestine without perforation or abscess without bleeding: Secondary | ICD-10-CM

## 2014-04-10 DIAGNOSIS — K760 Fatty (change of) liver, not elsewhere classified: Secondary | ICD-10-CM

## 2014-04-10 DIAGNOSIS — K561 Intussusception: Secondary | ICD-10-CM

## 2014-04-10 DIAGNOSIS — R0600 Dyspnea, unspecified: Secondary | ICD-10-CM

## 2014-04-10 DIAGNOSIS — J441 Chronic obstructive pulmonary disease with (acute) exacerbation: Secondary | ICD-10-CM

## 2014-04-11 ENCOUNTER — Other Ambulatory Visit: Payer: Self-pay

## 2014-04-11 MED ORDER — GLUCOSE BLOOD VI STRP: 1.0000 | ORAL_STRIP | Freq: Three times a day (TID) | Status: DC

## 2014-04-15 ENCOUNTER — Other Ambulatory Visit: Payer: Self-pay | Admitting: Physician Assistant

## 2014-04-15 MED ORDER — INSULIN GLARGINE 100 UNIT/ML ~~LOC~~ SOLN: SUBCUTANEOUS | Status: DC

## 2014-04-15 MED ORDER — INSULIN ASPART 100 UNIT/ML ~~LOC~~ SOLN: SUBCUTANEOUS | Status: DC

## 2014-04-16 ENCOUNTER — Other Ambulatory Visit: Payer: Medicare Other

## 2014-04-17 ENCOUNTER — Telehealth: Payer: Self-pay | Admitting: *Deleted

## 2014-04-17 MED ORDER — HYDROCODONE-ACETAMINOPHEN 5-325 MG PO TABS: ORAL_TABLET | ORAL | Status: DC

## 2014-04-17 NOTE — Telephone Encounter (Signed)
Pt is  Calling  for a refill hydrocodone. It is not due until the 4th but of course our office will be closed tomorrow and the 4th in on the weekend. Will refill medication.Will get Dr. Darene Lamer to sign

## 2014-05-02 ENCOUNTER — Other Ambulatory Visit: Payer: Self-pay | Admitting: *Deleted

## 2014-05-02 DIAGNOSIS — I10 Essential (primary) hypertension: Secondary | ICD-10-CM

## 2014-05-02 MED ORDER — FUROSEMIDE 40 MG PO TABS: 40.0000 mg | ORAL_TABLET | Freq: Every day | ORAL | Status: DC

## 2014-05-06 NOTE — Telephone Encounter (Signed)
A user error has taken place: encounter opened in error, closed for administrative reasons.

## 2014-05-15 ENCOUNTER — Other Ambulatory Visit: Payer: Self-pay | Admitting: Physician Assistant

## 2014-05-15 ENCOUNTER — Other Ambulatory Visit: Payer: Self-pay

## 2014-05-15 MED ORDER — HYDROCODONE-ACETAMINOPHEN 5-325 MG PO TABS: ORAL_TABLET | ORAL | Status: DC

## 2014-05-26 ENCOUNTER — Encounter: Payer: Medicare Other | Admitting: Physician Assistant

## 2014-05-26 ENCOUNTER — Telehealth: Payer: Self-pay | Admitting: Physician Assistant

## 2014-05-26 NOTE — Telephone Encounter (Signed)
Call pt: let her know her diabetes follow up was missed. Important to reschedule.

## 2014-05-26 NOTE — Progress Notes (Signed)
This encounter was created in error - please disregard.

## 2014-05-27 ENCOUNTER — Encounter: Payer: Self-pay | Admitting: Physician Assistant

## 2014-05-27 ENCOUNTER — Ambulatory Visit (INDEPENDENT_AMBULATORY_CARE_PROVIDER_SITE_OTHER): Payer: Medicare Other | Admitting: Physician Assistant

## 2014-05-27 VITALS — BP 122/64 | HR 72 | Ht 63.0 in | Wt 260.4 lb

## 2014-05-27 DIAGNOSIS — E1142 Type 2 diabetes mellitus with diabetic polyneuropathy: Secondary | ICD-10-CM

## 2014-05-27 DIAGNOSIS — E1149 Type 2 diabetes mellitus with other diabetic neurological complication: Secondary | ICD-10-CM

## 2014-05-27 DIAGNOSIS — IMO0002 Reserved for concepts with insufficient information to code with codable children: Secondary | ICD-10-CM | POA: Diagnosis not present

## 2014-05-27 LAB — POCT GLYCOSYLATED HEMOGLOBIN (HGB A1C): HEMOGLOBIN A1C: 7.8

## 2014-05-27 MED ORDER — LIRAGLUTIDE 18 MG/3ML ~~LOC~~ SOPN: PEN_INJECTOR | SUBCUTANEOUS | Status: DC

## 2014-05-27 MED ORDER — GABAPENTIN 800 MG PO TABS: 800.0000 mg | ORAL_TABLET | Freq: Three times a day (TID) | ORAL | Status: DC

## 2014-05-27 MED ORDER — HYDROCODONE-ACETAMINOPHEN 5-325 MG PO TABS: ORAL_TABLET | ORAL | Status: DC

## 2014-05-27 MED ORDER — AMBULATORY NON FORMULARY MEDICATION: Status: DC

## 2014-05-27 MED ORDER — "INSULIN SYRINGE-NEEDLE U-100 30G X 1/2"" 1 ML MISC": Status: DC

## 2014-05-27 NOTE — Telephone Encounter (Signed)
Pt in for diabetic f/u today.

## 2014-05-27 NOTE — Progress Notes (Signed)
   Subjective:    Patient ID: Rachael Fee, female    DOB: Aug 12, 1956, 58 y.o.   MRN: [id number]  HPI Patient presents to the clinic to followup on diabetes and to get medication refills.  Diabetes-patient is taking her fasting sugars and they're running 168 or higher in the morning. She's checking her sugars before meals and they are running around 200. She continues to take her oral medications as well as Lantus and NovoLog. She was not able to tolerate the [facility] She discovered she was very itchy after taking and when she removed it from her medication list the itching stopped. She is very fatigued. She states that mostly all she does is sleep take pain medicine and sleep again. She feels like the pain in her feet is worsening. Gabapentin was helping significantly for a while but now she feels like is worsening. She continues to be on Cymbalta 80 mg.   Patient needs refill on Norco. Her surgery was in 2001. She's not been seen by orthopedics since. She would like a referral to revisit injections or other treatments for back pain.   Review of Systems  All other systems reviewed and are negative.      Objective:   Physical Exam  Constitutional: She is oriented to person, place, and time. She appears well-developed and well-nourished.  Obese  HENT:  Head: Normocephalic and atraumatic.  Cardiovascular: Normal rate, regular rhythm and normal heart sounds.   Pulmonary/Chest: Effort normal and breath sounds normal.  Neurological: She is alert and oriented to person, place, and time.  Skin: Skin is dry.  Psychiatric: She has a normal mood and affect. Her behavior is normal.          Assessment & Plan:  Diabetes Mellitus with complications- Lab Results  Component Value Date   HGBA1C 8.0 02/21/2014   .Marland Kitchen Lab Results  Component Value Date   HGBA1C 7.8 05/27/2014   A1c did improve slightly since 3 months ago. She was not able to tolerate Glyxambi. Patient has not tried a GLP-1  agnoist. I think this could help in combination with insulin to bring down glucose. Added victoza. Samples given to start at .6mg  and increase after 1 week to 1.2. Pt aware of side effects. Keep checking sugars regularly. Stay on all other medications. Follow up in 1 month with sugar log. Call when need refill sent. Increase gabapentin to help with foot neuropathy. Will refer to podiatrist. Gave rx for diabetic shoes.    Back pain with radiculopathy- hx of surgery. Would like to be reevaluated. Will refer to orthopedics. On chronic norco to treat pain. Needs refill today. Willing to do other options.   Pt needs colonoscopy but GI wants her to do virtual. She prefers not too. She wants to go through with regular colonoscopy due to polyps. She would like for [place] to send letter.

## 2014-05-27 NOTE — Patient Instructions (Addendum)
Foot specialist.  Orthopedic specialist.  Start Victoza injection daily.

## 2014-05-30 DIAGNOSIS — E1142 Type 2 diabetes mellitus with diabetic polyneuropathy: Secondary | ICD-10-CM | POA: Insufficient documentation

## 2014-06-09 ENCOUNTER — Other Ambulatory Visit: Payer: Self-pay | Admitting: Physician Assistant

## 2014-06-09 ENCOUNTER — Telehealth: Payer: Self-pay

## 2014-06-09 MED ORDER — LIRAGLUTIDE 18 MG/3ML ~~LOC~~ SOPN: PEN_INJECTOR | SUBCUTANEOUS | Status: DC

## 2014-06-09 NOTE — Telephone Encounter (Signed)
i sent. It was on list just under generic. Make sure pt has coupon card and it is activated before she picks up at pharmacy.

## 2014-06-09 NOTE — Telephone Encounter (Signed)
Victoza is working. She reports normal blood sugar. She will need a refill on the Victoza and the needles. I do not see medication on her list. Please advise.

## 2014-06-09 NOTE — Telephone Encounter (Signed)
Opened in error

## 2014-06-12 ENCOUNTER — Telehealth: Payer: Self-pay

## 2014-06-12 ENCOUNTER — Other Ambulatory Visit: Payer: Self-pay

## 2014-06-12 MED ORDER — LIRAGLUTIDE 18 MG/3ML ~~LOC~~ SOPN: 1.2000 mg | PEN_INJECTOR | Freq: Every day | SUBCUTANEOUS | Status: DC

## 2014-06-12 MED ORDER — INSULIN PEN NEEDLE 32G X 6 MM MISC: Status: DC

## 2014-06-12 NOTE — Telephone Encounter (Signed)
[facility] was unable to receive the generic order for the Victoza so I sent it as the brand to the pharmacy.

## 2014-06-12 NOTE — Telephone Encounter (Signed)
Patient is aware and will pick up later today.

## 2014-06-13 ENCOUNTER — Ambulatory Visit: Payer: Self-pay | Admitting: Podiatry

## 2014-06-13 NOTE — Telephone Encounter (Signed)
ok 

## 2014-06-17 ENCOUNTER — Ambulatory Visit: Payer: Self-pay | Admitting: Podiatry

## 2014-06-18 ENCOUNTER — Telehealth: Payer: Self-pay

## 2014-06-18 NOTE — Telephone Encounter (Signed)
Patient found her prescription.

## 2014-06-18 NOTE — Telephone Encounter (Signed)
Can we confirm via pharmacy that she has not filled any norco after 7/31. If not ok for reprint but pt needs to be aware that we won't continue to refill early based on "lost" rx.

## 2014-06-18 NOTE — Telephone Encounter (Signed)
[name] Rice was wanting a refill on her Hydrocodone. Last written prescription was on 05/27/2014. She states her bottle was last filled on 05/16/2014. After review of chart it looks like [name] gave her an early prescription during an office visit on 05/27/2014. I asked her if she remembers getting the prescription during the visit. She did vaguely remember receiving the prescription. She did find the prescription in her pocketbook.

## 2014-06-19 DIAGNOSIS — Z23 Encounter for immunization: Secondary | ICD-10-CM | POA: Diagnosis not present

## 2014-06-23 ENCOUNTER — Other Ambulatory Visit: Payer: Self-pay | Admitting: Physician Assistant

## 2014-06-25 ENCOUNTER — Other Ambulatory Visit: Payer: Self-pay | Admitting: *Deleted

## 2014-06-25 DIAGNOSIS — E785 Hyperlipidemia, unspecified: Secondary | ICD-10-CM

## 2014-06-25 MED ORDER — MONTELUKAST SODIUM 10 MG PO TABS: 10.0000 mg | ORAL_TABLET | Freq: Every day | ORAL | Status: DC

## 2014-06-25 MED ORDER — PRAVASTATIN SODIUM 40 MG PO TABS: 40.0000 mg | ORAL_TABLET | Freq: Every day | ORAL | Status: DC

## 2014-06-25 MED ORDER — METHOCARBAMOL 500 MG PO TABS: ORAL_TABLET | ORAL | Status: DC

## 2014-06-30 ENCOUNTER — Other Ambulatory Visit: Payer: Self-pay | Admitting: Physician Assistant

## 2014-07-14 ENCOUNTER — Other Ambulatory Visit: Payer: Self-pay

## 2014-07-14 MED ORDER — HYDROCODONE-ACETAMINOPHEN 5-325 MG PO TABS: ORAL_TABLET | ORAL | Status: DC

## 2014-07-30 ENCOUNTER — Other Ambulatory Visit: Payer: Self-pay | Admitting: Physician Assistant

## 2014-08-01 ENCOUNTER — Other Ambulatory Visit: Payer: Self-pay

## 2014-08-12 ENCOUNTER — Other Ambulatory Visit: Payer: Self-pay

## 2014-08-12 MED ORDER — HYDROCODONE-ACETAMINOPHEN 5-325 MG PO TABS: ORAL_TABLET | ORAL | Status: DC

## 2014-08-14 ENCOUNTER — Encounter: Payer: Self-pay | Admitting: Emergency Medicine

## 2014-08-14 ENCOUNTER — Emergency Department (INDEPENDENT_AMBULATORY_CARE_PROVIDER_SITE_OTHER)
Admission: EM | Admit: 2014-08-14 | Discharge: 2014-08-14 | Disposition: A | Discharge status: Home / Self Care | Payer: Medicare Other | Source: Home / Self Care | Attending: Emergency Medicine | Admitting: Emergency Medicine

## 2014-08-14 DIAGNOSIS — K5732 Diverticulitis of large intestine without perforation or abscess without bleeding: Secondary | ICD-10-CM

## 2014-08-14 DIAGNOSIS — R1013 Epigastric pain: Secondary | ICD-10-CM

## 2014-08-14 MED ORDER — CIPROFLOXACIN HCL 500 MG PO TABS: 500.0000 mg | ORAL_TABLET | Freq: Two times a day (BID) | ORAL | Status: DC

## 2014-08-14 MED ORDER — METRONIDAZOLE 500 MG PO TABS: 500.0000 mg | ORAL_TABLET | Freq: Three times a day (TID) | ORAL | Status: DC

## 2014-08-14 NOTE — ED Provider Notes (Signed)
CSN: [id number]     Arrival date & time 08/14/14  1930 History   First MD Initiated Contact with Patient 08/14/14 1945     Chief Complaint  Patient presents with  . Abdominal Pain   (Consider location/radiation/quality/duration/timing/severity/associated sxs/prior Treatment) HPI She has been experiencing left lower quadrant pain for the last 4 days.  Her blood sugar has been a little labile recently.  She is also has some diarrhea and a lot of bloating and gassiness.  She has a history of diverticulosis and diverticulitis.  She gets a flare about every 2 years or so.  No fever, chills area no vomiting.  No dysuria or any other urinary symptoms.  Pain is located all in the left lower quadrant and does not radiate.  Past Medical History  Diagnosis Date  . Anxiety   . Arthritis   . Asthma     no PFTs  . COPD (chronic obstructive pulmonary disease)     no PFTs  . Depression   . Diabetes mellitus   . GERD (gastroesophageal reflux disease)   . Hypertension   . HLD (hyperlipidemia)   . Dizziness   . History of colonic polyps     hyperplastic  . Lumbar pain 2011    per x-ray - Multilevel spondylosis  . Cervical dystonia   . Tremor [place]  . Torn rotator cuff RT SHOULDER  . Diarrhea   . History of kidney stones   . Recurrent boils     "SLUSTER BOILS" ON ABD AND LABIA  . History of seborrheic dermatitis     face,ears  . OSA on CPAP 2008  . Sleep apnea     USES C-PAP  . Diverticulosis   . Diverticulitis   . Pneumonia    Past Surgical History  Procedure Laterality Date  . Pilonidal cyst excision  4163,8453  . Tubal ligation    . Lumbar laminectomy    . Carpal tunnel release Left 2006  . Elbow surgery Left   . Knee arthroscopy Left   . Nasal sinus surgery    . Hand surgery Right RIGHT  . Joint replacement Right     shoulder  . Mass excision  06/28/2012    Procedure: EXCISION MASS;  Surgeon: Harl Bowie, MD;  Location: [facility];  Service: General;   Laterality: Left;  excision left upper quadrant abdominal wall mass, Benign Lipoma   Family History  Problem Relation Age of Onset  . Heart disease Brother   . Heart attack Brother   . Depression Brother   . Diabetes Brother   . Hypertension Brother   . Hyperlipidemia Brother   . Cancer Neg Hx   . Diabetes Mother   . Hyperlipidemia Mother   . Hypertension Mother   . Depression Sister   . Diabetes Sister   . Hyperlipidemia Sister   . Hypertension Sister   . Stroke Sister   . Heart attack Brother   . Depression Brother   . Diabetes Brother   . Hyperlipidemia Brother   . Hypertension Brother   . Kidney disease Mother   . Kidney cancer Brother    History  Substance Use Topics  . Smoking status: Former Smoker    Types: Cigarettes    Quit date: 10/17/1996  . Smokeless tobacco: Never Used  . Alcohol Use: No   OB History   Grav Para Term Preterm Abortions TAB SAB Ect Mult Living  Review of Systems  All other systems reviewed and are negative.   Allergies  Ace inhibitors; Cefaclor; Shrimp; Glyxambi; and Sulfonamide derivatives  Home Medications   Prior to Admission medications   Medication Sig Start Date End Date Taking? Authorizing Provider  ADVAIR DISKUS 250-50 MCG/DOSE AEPB inhale 1 dose by mouth twice a day 07/01/14   Jade L Breeback, PA-C  albuterol (PROAIR HFA) 108 (90 BASE) MCG/ACT inhaler Inhale 2 puffs into the lungs every 6 (six) hours as needed for wheezing or shortness of breath. 09/18/13   Jade L Breeback, PA-C  allopurinol (ZYLOPRIM) 300 MG tablet take 1 tablet by mouth once daily 06/24/14   Jade L Breeback, PA-C  AMBULATORY NON FORMULARY MEDICATION Pt needs CPAP to be replaced and checked for proper functioning.   DX- OSA 11/25/13   Jade L Breeback, PA-C  AMBULATORY NON FORMULARY MEDICATION zostavax IM once. 02/21/14   Jade L Breeback, PA-C  AMBULATORY NON FORMULARY MEDICATION Test strips and needles for one touch Verio IQ glucometer 03/31/14    Hali Marry, MD  AMBULATORY NON FORMULARY MEDICATION Diabetic shoes  Dx: Diabetes, uncontrolled 250.60 with neuropathy. 05/27/14   Jade L Breeback, PA-C  ciprofloxacin (CIPRO) 500 MG tablet Take 1 tablet (500 mg total) by mouth 2 (two) times daily. 08/14/14   Janeann Forehand, MD  DULoxetine (CYMBALTA) 20 MG capsule Patient takes 20mg  in addition to 60 mg a day with total of 80 mg /day 11/22/13   Donella Stade, PA-C  DULoxetine (CYMBALTA) 60 MG capsule TAKE 1 CAPSULE BY MOUTH ONCE DAILY 03/14/14   Jade L Breeback, PA-C  furosemide (LASIX) 40 MG tablet Take 1 tablet (40 mg total) by mouth daily. 05/02/14   Jade L Breeback, PA-C  gabapentin (NEURONTIN) 800 MG tablet Take 1 tablet (800 mg total) by mouth 3 (three) times daily. 05/27/14   Jade L Breeback, PA-C  glucose blood (ONETOUCH VERIO) test strip 1 each by Other route 3 (three) times daily. Check blood sugar 3 times daily. Diagnosis Type II or unspecified type diabetes mellitus with neurological manifestations, not stated as uncontrolled(250.60) 04/11/14   Hali Marry, MD  HYDROcodone-acetaminophen (NORCO/VICODIN) 5-325 MG per tablet TAKE 1 TO 2 TABLETS BY MOUTH TWICE DAILY 08/12/14   Jade L Breeback, PA-C  insulin aspart (NOVOLOG) 100 UNIT/ML injection INJECT 60 UNITS UNDER THESKIN THREE TIMES DAILY BEFORE MEALS 04/15/14   Jade L Breeback, PA-C  insulin glargine (LANTUS) 100 UNIT/ML injection INJECT 0.7 MLS INTO THE SKIN TWICE DAILY 04/15/14   Donella Stade, PA-C  Insulin Pen Needle (NOVOFINE) 32G X 6 MM MISC Inject once daily [place] 06/12/14   Jade L Breeback, PA-C  Insulin Syringe-Needle U-100 (B-D INS SYR ULTRAFINE 1CC/30G) 30G X 1/2" 1 ML MISC USE AS DIRECTED THREE TIMES DAILY 05/27/14   Donella Stade, PA-C  Insulin Syringe-Needle U-100 31G X 5/16" 1 ML MISC Use as directed for insulin injections 5 times per day.  Diagnosis ICD9 250.00, on insulin. 04/29/13   Jones Bales, MD  Liraglutide (VICTOZA) 18 MG/3ML SOPN Inject 1.2 mg  into the skin daily. 06/12/14   Jade L Breeback, PA-C  meclizine (ANTIVERT) 25 MG tablet Take 1 tablet (25 mg total) by mouth 3 (three) times daily as needed for dizziness. 12/04/12   Pedro Earls, MD  metFORMIN (GLUCOPHAGE) 1000 MG tablet Take 1 tablet (1,000 mg total) by mouth 2 (two) times daily with a meal. 09/18/13   Jade L Breeback, PA-C  methocarbamol (ROBAXIN)  500 MG tablet TAKE 1 TABLET BY MOUTH THREE TIMES DAILY 06/25/14   Jade L Breeback, PA-C  metroNIDAZOLE (FLAGYL) 500 MG tablet Take 1 tablet (500 mg total) by mouth 3 (three) times daily. 08/14/14   Janeann Forehand, MD  mometasone (NASONEX) 50 MCG/ACT nasal spray Place 2 sprays into the nose daily as needed (for nasal inflammation).    Historical Provider, MD  montelukast (SINGULAIR) 10 MG tablet Take 1 tablet (10 mg total) by mouth at bedtime. 06/25/14   Jade L Breeback, PA-C  neomycin-polymyxin-hydrocortisone (CORTISPORIN) otic solution Place 3 drops into both ears 3 (three) times daily as needed (for psoriasis in ear, only as needed).    Historical Provider, MD  olopatadine (PATANOL) 0.1 % ophthalmic solution Place 1 drop into both eyes 2 (two) times daily as needed. Itching eyes 02/27/12   Pedro Earls, MD  pantoprazole (PROTONIX) 40 MG tablet Take 1 tablet (40 mg total) by mouth daily. 11/22/13   Jade L Breeback, PA-C  pravastatin (PRAVACHOL) 40 MG tablet Take 1 tablet (40 mg total) by mouth daily. 06/25/14   Jade L Breeback, PA-C  propranolol (INDERAL) 20 MG tablet take 1 tablet by mouth twice a day 07/31/14   Donella Stade, PA-C  triamcinolone cream (KENALOG) 0.1 % Apply 1 application topically 2 (two) times daily. 11/22/13   Jade L Breeback, PA-C   BP 118/74  Pulse 86  Temp(Src) 97.9 F (36.6 C) (Oral)  Resp 20  Wt 258 lb (117.028 kg)  SpO2 94%  LMP 10/18/1995 Physical Exam  Nursing note and vitals reviewed. Constitutional: She is oriented to person, place, and time. Vital signs are normal. She appears well-developed and  well-nourished.  Non-toxic appearance. She does not appear ill.  HENT:  Head: Normocephalic and atraumatic.  Eyes: No scleral icterus.  Neck: Neck supple.  Cardiovascular: Normal rate, regular rhythm and normal heart sounds.   Pulmonary/Chest: Effort normal and breath sounds normal. No respiratory distress. She has no decreased breath sounds. She has no wheezes. She has no rhonchi.  Abdominal: Soft. Normal appearance. There is tenderness in the left lower quadrant. There is no rigidity, no rebound, no guarding, no CVA tenderness, no tenderness at McBurney's point and negative Murphy's sign.  Neurological: She is alert and oriented to person, place, and time.  Skin: Skin is warm and dry.  Psychiatric: She has a normal mood and affect. Her speech is normal.    ED Course  Procedures (including critical care time) Labs Review Labs Reviewed - No data to display  Imaging Review No results found.   MDM   1. Abdominal pain, epigastric   2. Diverticulitis of colon    By history and physical, this patient likely has diverticulitis of colon.  I gave her prescription for Cipro and Flagyl.  Also drew labs including CBC, CMP, amylase, and lipase.  Will call patient back likely tomorrow with these results.  Encourage bland liquid diet for the next couple days.  If not improving, would like her follow-up with her primary care physician.  ER precautions are given for worsening pain, or new severe symptoms.  Janeann Forehand, MD 08/14/14 2007

## 2014-08-14 NOTE — ED Notes (Signed)
[name] Rice c/o left lower abdominal pain that radiates to her left flank x 4 days. 4 days ago her BS dropped and started to have diarrhea. Since, has felt bloated and gassy.

## 2014-08-15 ENCOUNTER — Telehealth: Payer: Self-pay | Admitting: Emergency Medicine

## 2014-08-15 LAB — COMPLETE METABOLIC PANEL WITH GFR
ALT: 23 U/L (ref 0–35)
AST: 18 U/L (ref 0–37)
Albumin: 3.8 g/dL (ref 3.5–5.2)
Alkaline Phosphatase: 90 U/L (ref 39–117)
BUN: 13 mg/dL (ref 6–23)
CALCIUM: 8.5 mg/dL (ref 8.4–10.5)
CO2: 22 mEq/L (ref 19–32)
Chloride: 100 mEq/L (ref 96–112)
Creat: 0.75 mg/dL (ref 0.50–1.10)
GFR, EST NON AFRICAN AMERICAN: 87 mL/min
GFR, Est African American: >89 mL/min
GLUCOSE: 158 mg/dL — AB (ref 70–99)
POTASSIUM: 5.3 meq/L (ref 3.5–5.3)
Sodium: 134 mEq/L — ABNORMAL LOW (ref 135–145)
Total Bilirubin: 0.4 mg/dL (ref 0.2–1.2)
Total Protein: 6.8 g/dL (ref 6.0–8.3)

## 2014-08-15 LAB — AMYLASE: AMYLASE: 44 U/L (ref 0–105)

## 2014-08-15 LAB — LIPASE: Lipase: 34 U/L (ref 0–75)

## 2014-08-22 DIAGNOSIS — E1140 Type 2 diabetes mellitus with diabetic neuropathy, unspecified: Secondary | ICD-10-CM | POA: Diagnosis not present

## 2014-08-22 DIAGNOSIS — R1084 Generalized abdominal pain: Secondary | ICD-10-CM | POA: Diagnosis not present

## 2014-08-22 DIAGNOSIS — I10 Essential (primary) hypertension: Secondary | ICD-10-CM | POA: Diagnosis not present

## 2014-08-22 DIAGNOSIS — K5790 Diverticulosis of intestine, part unspecified, without perforation or abscess without bleeding: Secondary | ICD-10-CM | POA: Diagnosis not present

## 2014-08-22 DIAGNOSIS — Z79899 Other long term (current) drug therapy: Secondary | ICD-10-CM | POA: Diagnosis not present

## 2014-08-22 DIAGNOSIS — Z888 Allergy status to other drugs, medicaments and biological substances status: Secondary | ICD-10-CM | POA: Diagnosis not present

## 2014-08-22 DIAGNOSIS — F329 Major depressive disorder, single episode, unspecified: Secondary | ICD-10-CM | POA: Diagnosis not present

## 2014-08-22 DIAGNOSIS — Z883 Allergy status to other anti-infective agents status: Secondary | ICD-10-CM | POA: Diagnosis not present

## 2014-08-22 DIAGNOSIS — J449 Chronic obstructive pulmonary disease, unspecified: Secondary | ICD-10-CM | POA: Diagnosis not present

## 2014-08-22 DIAGNOSIS — R10817 Generalized abdominal tenderness: Secondary | ICD-10-CM | POA: Diagnosis not present

## 2014-08-22 DIAGNOSIS — Z87891 Personal history of nicotine dependence: Secondary | ICD-10-CM | POA: Diagnosis not present

## 2014-08-22 DIAGNOSIS — R112 Nausea with vomiting, unspecified: Secondary | ICD-10-CM | POA: Diagnosis not present

## 2014-08-22 DIAGNOSIS — R9431 Abnormal electrocardiogram [ECG] [EKG]: Secondary | ICD-10-CM | POA: Diagnosis not present

## 2014-08-22 DIAGNOSIS — Z882 Allergy status to sulfonamides status: Secondary | ICD-10-CM | POA: Diagnosis not present

## 2014-08-22 DIAGNOSIS — Z794 Long term (current) use of insulin: Secondary | ICD-10-CM | POA: Diagnosis not present

## 2014-08-22 DIAGNOSIS — Z91013 Allergy to seafood: Secondary | ICD-10-CM | POA: Diagnosis not present

## 2014-08-22 DIAGNOSIS — G4730 Sleep apnea, unspecified: Secondary | ICD-10-CM | POA: Diagnosis not present

## 2014-08-22 DIAGNOSIS — K2970 Gastritis, unspecified, without bleeding: Secondary | ICD-10-CM | POA: Diagnosis not present

## 2014-08-25 ENCOUNTER — Ambulatory Visit: Payer: Medicare Other | Admitting: Physician Assistant

## 2014-08-27 ENCOUNTER — Ambulatory Visit: Payer: Medicare Other | Admitting: Physician Assistant

## 2014-08-30 ENCOUNTER — Other Ambulatory Visit: Payer: Self-pay | Admitting: Physician Assistant

## 2014-09-01 ENCOUNTER — Encounter: Payer: Self-pay | Admitting: Physician Assistant

## 2014-09-01 ENCOUNTER — Ambulatory Visit (INDEPENDENT_AMBULATORY_CARE_PROVIDER_SITE_OTHER): Payer: Medicare Other | Admitting: Physician Assistant

## 2014-09-01 VITALS — BP 139/66 | HR 80 | Ht 63.0 in | Wt 259.0 lb

## 2014-09-01 DIAGNOSIS — E1169 Type 2 diabetes mellitus with other specified complication: Secondary | ICD-10-CM | POA: Diagnosis not present

## 2014-09-01 DIAGNOSIS — R110 Nausea: Secondary | ICD-10-CM

## 2014-09-01 DIAGNOSIS — R109 Unspecified abdominal pain: Secondary | ICD-10-CM

## 2014-09-01 LAB — POCT GLYCOSYLATED HEMOGLOBIN (HGB A1C): Hemoglobin A1C: 8

## 2014-09-01 MED ORDER — INSULIN GLARGINE 300 UNIT/ML ~~LOC~~ SOPN: 10.0000 [IU] | PEN_INJECTOR | Freq: Every day | SUBCUTANEOUS | Status: DC

## 2014-09-01 MED ORDER — LIRAGLUTIDE 18 MG/3ML ~~LOC~~ SOPN: 1.2000 mg | PEN_INJECTOR | Freq: Every day | SUBCUTANEOUS | Status: DC

## 2014-09-01 NOTE — Patient Instructions (Addendum)
Will order gastric empty to rule out gastroparesis.   toujeo switch. Start at 10 units increase by 2 units every 5 days.   Dr. Harolyn Rutherford eye doctor appt.

## 2014-09-01 NOTE — Progress Notes (Signed)
   Subjective:    Patient ID: [name] Rice, female    DOB: 1955/11/30, 58 y.o.   MRN: [id number]  HPI  Pt presents to the clinic to follow up on diabetes. Checking fasting sugars at 110 in am. Sugars before meals at 150-200. She was in the hospital and didn't take Victoza for 2-3 weeks. At [facility] treated for diverticulitis but then went to ED and CT scan did not show any diverticulitis. ED could not find any reason for abdominal pain. Abdominal pain is better today but still off and on. She continues to be nauseated but no vomiting. She doesn't really have appetitie. She admits her diet and not been good for last 3 months. Continues to take Lantus 7units twice a day with 60 units at each meal.   Review of Systems  All other systems reviewed and are negative.      Objective:   Physical Exam  Constitutional: She is oriented to person, place, and time. She appears well-developed and well-nourished.  HENT:  Head: Normocephalic and atraumatic.  Cardiovascular: Normal rate, regular rhythm and normal heart sounds.   Pulmonary/Chest: Effort normal and breath sounds normal. She has no wheezes.  Abdominal: Soft. Bowel sounds are normal. She exhibits no distension and no mass. There is tenderness. There is no rebound and no guarding.  Tenderness over entire abdomen to palpation.  No guarding or rebound.   Neurological: She is alert and oriented to person, place, and time.  Skin: Skin is dry.  Psychiatric: She has a normal mood and affect. Her behavior is normal.          Assessment & Plan:  Diabetes- .Marland Kitchen Lab Results  Component Value Date   HGBA1C 8.0 09/01/2014   Up from last 3 months.  Switched lantus to toujeo to get more coverage and decrease to once a day. Start at 10 units. Her fasting are good so I do not want to add too much long acting.  Discussed NO candy and sodas. Pt admits to eating a lot of this.  I am some concerned gastroparesis could be setting in. Will get set up for scan.   Continue on meal time insulin, metformin, and vicotza.  Follow up in 3 months.   Continue to use zofran as needed for nausea.

## 2014-09-10 ENCOUNTER — Telehealth: Payer: Self-pay | Admitting: *Deleted

## 2014-09-10 NOTE — Telephone Encounter (Signed)
[name] Rice calls this morning and reports that she has been sick with abdominal pain and nausea for the last 2 evenings. She feels that she is getting worse and isn't sure if she is going to be able to make it until Monday when she has the test done at [facility] for her stomach. She said that this is the test where she eats an egg with dye in it and they scan her every 30 minutes for 2 hrs. She said that she is taking all the medicine that you have given her and it slowly has stopped working. She denies fever or blood in stools. Please advise. Margette Fast, CMA

## 2014-09-10 NOTE — Telephone Encounter (Signed)
Can call GI to see if they have any suggestions. You already have pain medication and anti-nausea. You recently had CT scan. Recent work up found no etiology of symptoms. If pain gets worse or just need medication relief go to emergency room.

## 2014-09-10 NOTE — Telephone Encounter (Signed)
Quilla aware and verbalized understanding. Margette Fast, CMA

## 2014-09-15 ENCOUNTER — Other Ambulatory Visit: Payer: Self-pay | Admitting: *Deleted

## 2014-09-15 ENCOUNTER — Encounter (HOSPITAL_COMMUNITY)
Admission: RE | Admit: 2014-09-15 | Discharge: 2014-09-15 | Disposition: A | Discharge status: Home / Self Care | Payer: Medicare Other | Source: Ambulatory Visit | Attending: Physician Assistant | Admitting: Physician Assistant

## 2014-09-15 DIAGNOSIS — R110 Nausea: Secondary | ICD-10-CM | POA: Diagnosis not present

## 2014-09-15 DIAGNOSIS — E1169 Type 2 diabetes mellitus with other specified complication: Secondary | ICD-10-CM | POA: Diagnosis not present

## 2014-09-15 DIAGNOSIS — K219 Gastro-esophageal reflux disease without esophagitis: Secondary | ICD-10-CM | POA: Diagnosis not present

## 2014-09-15 DIAGNOSIS — R109 Unspecified abdominal pain: Secondary | ICD-10-CM | POA: Insufficient documentation

## 2014-09-15 MED ORDER — TECHNETIUM TC 99M SULFUR COLLOID
2.0000 | Freq: Once | INTRAVENOUS | Status: AC | PRN
  Administered 2014-09-15: 2 via ORAL

## 2014-09-15 MED ORDER — HYDROCODONE-ACETAMINOPHEN 5-325 MG PO TABS: ORAL_TABLET | ORAL | Status: DC

## 2014-09-15 NOTE — Progress Notes (Signed)
Message left for hydrocodone refill. Margette Fast, CMA

## 2014-09-16 ENCOUNTER — Other Ambulatory Visit: Payer: Self-pay | Admitting: Physician Assistant

## 2014-09-16 ENCOUNTER — Encounter: Payer: Self-pay | Admitting: Physician Assistant

## 2014-09-16 DIAGNOSIS — E1343 Other specified diabetes mellitus with diabetic autonomic (poly)neuropathy: Secondary | ICD-10-CM | POA: Insufficient documentation

## 2014-09-19 ENCOUNTER — Other Ambulatory Visit: Payer: Self-pay | Admitting: *Deleted

## 2014-09-19 DIAGNOSIS — H9201 Otalgia, right ear: Secondary | ICD-10-CM

## 2014-09-19 MED ORDER — NEOMYCIN-POLYMYXIN-HC 3.5-10000-1 OT SOLN: 3.0000 [drp] | Freq: Four times a day (QID) | OTIC | Status: DC

## 2014-09-30 ENCOUNTER — Other Ambulatory Visit: Payer: Self-pay | Admitting: Physician Assistant

## 2014-10-01 ENCOUNTER — Ambulatory Visit: Payer: Medicare Other | Admitting: Internal Medicine

## 2014-10-14 ENCOUNTER — Other Ambulatory Visit: Payer: Self-pay | Admitting: *Deleted

## 2014-10-14 ENCOUNTER — Telehealth: Payer: Self-pay | Admitting: *Deleted

## 2014-10-14 DIAGNOSIS — K219 Gastro-esophageal reflux disease without esophagitis: Secondary | ICD-10-CM

## 2014-10-14 MED ORDER — HYDROCODONE-ACETAMINOPHEN 5-325 MG PO TABS: ORAL_TABLET | ORAL | Status: DC

## 2014-10-14 MED ORDER — PANTOPRAZOLE SODIUM 40 MG PO TBEC: 40.0000 mg | DELAYED_RELEASE_TABLET | Freq: Every day | ORAL | Status: DC

## 2014-10-14 MED ORDER — ALLOPURINOL 300 MG PO TABS: 300.0000 mg | ORAL_TABLET | Freq: Every day | ORAL | Status: DC

## 2014-10-14 NOTE — Telephone Encounter (Signed)
[name] Rice called back and was given dosing instructions for toujeo and verbalized understanding.

## 2014-10-14 NOTE — Telephone Encounter (Signed)
Voicemail left for Davey to return call regarding toujeo increase.

## 2014-10-14 NOTE — Telephone Encounter (Signed)
Increase by 2 units every 5 days until fasting sugars below 120 then stop.

## 2014-10-14 NOTE — Telephone Encounter (Signed)
[name] Rice calls today and states that she needs a refill for her hydrocodone 5-325. She stated that it isnt due until January 4th. Is this okay to fill?

## 2014-10-14 NOTE — Telephone Encounter (Signed)
[name] Rice states that her blood sugars are running high since using the toujeo. She said that she is taking 45units at night. She asks if she needs to increase this medication. Please advise.

## 2014-10-21 ENCOUNTER — Other Ambulatory Visit: Payer: Self-pay | Admitting: Physician Assistant

## 2014-10-24 ENCOUNTER — Ambulatory Visit: Payer: Medicare Other | Admitting: Internal Medicine

## 2014-11-13 ENCOUNTER — Other Ambulatory Visit: Payer: Self-pay

## 2014-11-13 MED ORDER — HYDROCODONE-ACETAMINOPHEN 5-325 MG PO TABS: ORAL_TABLET | ORAL | Status: DC

## 2014-11-14 ENCOUNTER — Other Ambulatory Visit: Payer: Self-pay | Admitting: Physician Assistant

## 2014-11-14 NOTE — Telephone Encounter (Signed)
Appointment due around 12/02/14

## 2014-11-16 ENCOUNTER — Other Ambulatory Visit: Payer: Self-pay | Admitting: Physician Assistant

## 2014-11-24 ENCOUNTER — Telehealth: Payer: Self-pay | Admitting: Physician Assistant

## 2014-11-24 NOTE — Telephone Encounter (Signed)
Call pt: how long have you been put on propranolol? It is not always the best beta block for people with COPD/asthma conditions. There are more preferred have you ever tried atenolol?

## 2014-11-25 ENCOUNTER — Other Ambulatory Visit: Payer: Self-pay | Admitting: Physician Assistant

## 2014-11-26 ENCOUNTER — Ambulatory Visit: Payer: Medicare Other | Admitting: Internal Medicine

## 2014-11-29 ENCOUNTER — Emergency Department (INDEPENDENT_AMBULATORY_CARE_PROVIDER_SITE_OTHER)
Admission: EM | Admit: 2014-11-29 | Discharge: 2014-11-29 | Disposition: A | Discharge status: Home / Self Care | Payer: Medicare Other | Source: Home / Self Care | Attending: Family Medicine | Admitting: Family Medicine

## 2014-11-29 ENCOUNTER — Emergency Department
Admission: EM | Admit: 2014-11-29 | Discharge: 2014-11-29 | Disposition: A | Discharge status: Home / Self Care | Payer: Medicare Other | Source: Home / Self Care

## 2014-11-29 DIAGNOSIS — H6983 Other specified disorders of Eustachian tube, bilateral: Secondary | ICD-10-CM

## 2014-11-29 DIAGNOSIS — H6591 Unspecified nonsuppurative otitis media, right ear: Secondary | ICD-10-CM

## 2014-11-29 MED ORDER — PREDNISONE 20 MG PO TABS: 20.0000 mg | ORAL_TABLET | Freq: Two times a day (BID) | ORAL | Status: DC

## 2014-11-29 MED ORDER — AMOXICILLIN 875 MG PO TABS: 875.0000 mg | ORAL_TABLET | Freq: Two times a day (BID) | ORAL | Status: DC

## 2014-11-29 NOTE — ED Provider Notes (Signed)
CSN: [id number]     Arrival date & time 11/29/14  1525 History   First MD Initiated Contact with Patient 11/29/14 1541     Chief Complaint  Patient presents with  . Facial Pain      HPI Comments: Patient reports developing increased sinus congestion about 10 days ago and two days later developed a right earache that has persisted.  She complains of decreased hearing.  She also has pressure and discomfort in her face.  She developed a mild cough about four days ago, resolved.  The history is provided by the patient.    Past Medical History  Diagnosis Date  . Anxiety   . Arthritis   . Asthma     no PFTs  . COPD (chronic obstructive pulmonary disease)     no PFTs  . Depression   . Diabetes mellitus   . GERD (gastroesophageal reflux disease)   . Hypertension   . HLD (hyperlipidemia)   . Dizziness   . History of colonic polyps     hyperplastic  . Lumbar pain 2011    per x-ray - Multilevel spondylosis  . Cervical dystonia   . Tremor [place]  . Torn rotator cuff RT SHOULDER  . Diarrhea   . History of kidney stones   . Recurrent boils     "SLUSTER BOILS" ON ABD AND LABIA  . History of seborrheic dermatitis     face,ears  . OSA on CPAP 2008  . Sleep apnea     USES C-PAP  . Diverticulosis   . Diverticulitis   . Pneumonia   . Morbid obesity   . Nephrolithiasis   . Gastroparesis    Past Surgical History  Procedure Laterality Date  . Pilonidal cyst excision  [id number]  . Tubal ligation    . Lumbar laminectomy    . Carpal tunnel release Left 2006  . Elbow surgery Left   . Knee arthroscopy Left   . Nasal sinus surgery    . Hand surgery Right RIGHT  . Joint replacement Right     shoulder  . Mass excision  06/28/2012    Procedure: EXCISION MASS;  Surgeon: Harl Bowie, MD;  Location: [facility];  Service: General;  Laterality: Left;  excision left upper quadrant abdominal wall mass, Benign Lipoma   Family History  Problem Relation Age of Onset  . Heart  disease Brother   . Heart attack Brother   . Depression Brother   . Diabetes Brother   . Hypertension Brother   . Hyperlipidemia Brother   . Cancer Neg Hx   . Diabetes Mother   . Hyperlipidemia Mother   . Hypertension Mother   . Depression Sister   . Diabetes Sister   . Hyperlipidemia Sister   . Hypertension Sister   . Stroke Sister   . Heart attack Brother   . Depression Brother   . Diabetes Brother   . Hyperlipidemia Brother   . Hypertension Brother   . Kidney disease Mother   . Kidney cancer Brother    History  Substance Use Topics  . Smoking status: Former Smoker    Types: Cigarettes    Quit date: 10/17/1996  . Smokeless tobacco: Never Used  . Alcohol Use: No   OB History    No data available     Review of Systems No sore throat No cough + sneezing No pleuritic pain No wheezing + nasal congestion + post-nasal drainage + sinus pain/pressure No itchy/red eyes +  earache No hemoptysis No SOB No fever/chills No nausea No vomiting No abdominal pain No diarrhea No urinary symptoms No skin rash No fatigue No myalgias No headache Used OTC meds without relief  Allergies  Ace inhibitors; Cefaclor; Shrimp; Glyxambi; and Sulfonamide derivatives  Home Medications   Prior to Admission medications   Medication Sig Start Date End Date Taking? Authorizing Provider  ADVAIR DISKUS 250-50 MCG/DOSE AEPB inhale 1 dose by mouth twice a day 10/21/14  Yes Jade L Breeback, PA-C  albuterol (PROAIR HFA) 108 (90 BASE) MCG/ACT inhaler Inhale 2 puffs into the lungs every 6 (six) hours as needed for wheezing or shortness of breath. 09/18/13  Yes Jade L Breeback, PA-C  allopurinol (ZYLOPRIM) 300 MG tablet take 1 tablet by mouth once daily 11/18/14  Yes Jade L Breeback, PA-C  AMBULATORY NON FORMULARY MEDICATION zostavax IM once. 02/21/14  Yes Jade L Breeback, PA-C  AMBULATORY NON FORMULARY MEDICATION Test strips and needles for one touch Verio IQ glucometer 03/31/14  Yes Hali Marry, MD  AMBULATORY NON FORMULARY MEDICATION Diabetic shoes  Dx: Diabetes, uncontrolled 250.60 with neuropathy. 05/27/14  Yes Jade L Breeback, PA-C  DULoxetine (CYMBALTA) 20 MG capsule Patient takes 20mg  in addition to 60 mg a day with total of 80 mg /day 11/22/13  Yes Jade L Breeback, PA-C  DULoxetine (CYMBALTA) 60 MG capsule take 1 capsule by mouth once daily (TAKE WITH 20 MG TO EQUAL 80MG ) 11/18/14  Yes Jade L Breeback, PA-C  furosemide (LASIX) 40 MG tablet take 1 tablet by mouth once daily 11/18/14  Yes Jade L Breeback, PA-C  gabapentin (NEURONTIN) 800 MG tablet take 1 tablet by mouth three times a day 11/14/14  Yes Hali Marry, MD  glucose blood (ONETOUCH VERIO) test strip 1 each by Other route 3 (three) times daily. Check blood sugar 3 times daily. Diagnosis Type II or unspecified type diabetes mellitus with neurological manifestations, not stated as uncontrolled(250.60) 04/11/14  Yes Hali Marry, MD  HYDROcodone-acetaminophen (NORCO/VICODIN) 5-325 MG per tablet TAKE 1 TO 2 TABLETS BY MOUTH TWICE DAILY 11/13/14  Yes Hali Marry, MD  insulin aspart (NOVOLOG) 100 UNIT/ML injection INJECT 60 UNITS UNDER THESKIN THREE TIMES DAILY BEFORE MEALS 04/15/14  Yes Jade L Breeback, PA-C  Insulin Glargine (TOUJEO SOLOSTAR) 300 UNIT/ML SOPN Inject 10 Units into the skin at bedtime. 09/01/14  Yes Jade L Breeback, PA-C  Insulin Pen Needle (NOVOFINE) 32G X 6 MM MISC Inject once daily [place] 06/12/14  Yes Jade L Breeback, PA-C  Insulin Syringe-Needle U-100 (B-D INS SYR ULTRAFINE 1CC/30G) 30G X 1/2" 1 ML MISC USE AS DIRECTED THREE TIMES DAILY 05/27/14  Yes Jade L Breeback, PA-C  Liraglutide (VICTOZA) 18 MG/3ML SOPN Inject 1.2 mg into the skin daily. 09/01/14  Yes Jade L Breeback, PA-C  meclizine (ANTIVERT) 25 MG tablet Take 1 tablet (25 mg total) by mouth 3 (three) times daily as needed for dizziness. 12/04/12  Yes Pedro Earls, MD  metFORMIN (GLUCOPHAGE) 1000 MG tablet take 1 tablet by mouth twice  a day with food 09/30/14  Yes Jade L Breeback, PA-C  methocarbamol (ROBAXIN) 500 MG tablet TAKE 1 TABLET BY MOUTH THREE TIMES DAILY 06/25/14  Yes Jade L Breeback, PA-C  montelukast (SINGULAIR) 10 MG tablet take 1 tablet by mouth at bedtime 11/26/14  Yes Jade L Breeback, PA-C  neomycin-polymyxin-hydrocortisone (CORTISPORIN) otic solution Place 3 drops into both ears 3 (three) times daily as needed (for psoriasis in ear, only as needed).   Yes Historical  Provider, MD  olopatadine (PATANOL) 0.1 % ophthalmic solution Place 1 drop into both eyes 2 (two) times daily as needed. Itching eyes 02/27/12  Yes Pedro Earls, MD  ondansetron (ZOFRAN) 4 MG tablet Take 4 mg by mouth every 6 (six) hours as needed for nausea or vomiting.   Yes Historical Provider, MD  pantoprazole (PROTONIX) 40 MG tablet Take 1 tablet (40 mg total) by mouth daily. 10/14/14  Yes Jade L Breeback, PA-C  pravastatin (PRAVACHOL) 40 MG tablet Take 1 tablet (40 mg total) by mouth daily. 06/25/14  Yes Jade L Breeback, PA-C  propranolol (INDERAL) 20 MG tablet take 1 tablet by mouth twice a day 11/18/14  Yes Jade L Breeback, PA-C  triamcinolone cream (KENALOG) 0.1 % Apply 1 application topically 2 (two) times daily. 11/22/13  Yes Jade L Breeback, PA-C  amoxicillin (AMOXIL) 875 MG tablet Take 1 tablet (875 mg total) by mouth 2 (two) times daily. 11/29/14   Kandra Nicolas, MD  predniSONE (DELTASONE) 20 MG tablet Take 1 tablet (20 mg total) by mouth 2 (two) times daily. Take with food. 11/29/14   Kandra Nicolas, MD   BP 128/73 mmHg  Pulse 87  Temp(Src) 97.6 F (36.4 C) (Oral)  Wt 256 lb (116.121 kg)  SpO2 97%  LMP 10/18/1995 Physical Exam Nursing notes and Vital Signs reviewed. Appearance:  Patient appears stated age, and in no acute distress.  Patient is obese  Eyes:  Pupils are equal, round, and reactive to light and accomodation.  Extraocular movement is intact.  Conjunctivae are not inflamed  Ears:  Canals normal.  Tympanic membranes appear  normal.  Nose:  Mildly congested turbinates.   Mild maxillary sinus tenderness is present.  Pharynx:  Normal Neck:  Supple.  Slightly tender enlarged right posterior nodes are palpated Lungs:  Clear to auscultation.  Breath sounds are equal.  Heart:  Regular rate and rhythm without murmurs, rubs, or gallops.  Abdomen:  Nontender without masses or hepatosplenomegaly.  Bowel sounds are present.  No CVA or flank tenderness.  Extremities:  No edema.  No calf tenderness Skin:  No rash present.   ED Course  Procedures  None   Labs Reviewed -   Tympanogram:  Right ear low peak height ("flat line"); left ear low peak height       MDM   1. Right non-suppurative otitis media, recurrence not specified   2. Eustachian tube dysfunction, bilateral    Begin amoxicillin 875mg  BID, and prednisone burst. May use Afrin nasal spray (or generic oxymetazoline) once or twice daily for about 5 days.  Also recommend using saline nasal spray several times daily and saline nasal irrigation (AYR is a common brand).  Use Nasonex spray in the morning after Afrin spray and saline irrigation. Followup with ENT if not improved 10 days.    Kandra Nicolas, MD 11/30/14 2118

## 2014-11-29 NOTE — ED Notes (Signed)
Patient complains of sinus pain and pressure,bilateral ear pain and fullness for approx 10 days

## 2014-11-29 NOTE — Discharge Instructions (Signed)
May use Afrin nasal spray (or generic oxymetazoline) once or twice daily for about 5 days.  Also recommend using saline nasal spray several times daily and saline nasal irrigation (AYR is a common brand).  Use Nasonex spray in the morning after Afrin spray and saline irrigation.

## 2014-12-03 ENCOUNTER — Ambulatory Visit: Payer: Medicare Other | Admitting: Physician Assistant

## 2014-12-05 ENCOUNTER — Encounter: Payer: Self-pay | Admitting: Physician Assistant

## 2014-12-05 ENCOUNTER — Other Ambulatory Visit: Payer: Self-pay

## 2014-12-05 ENCOUNTER — Ambulatory Visit (INDEPENDENT_AMBULATORY_CARE_PROVIDER_SITE_OTHER): Payer: Medicare Other | Admitting: Physician Assistant

## 2014-12-05 VITALS — BP 158/83 | HR 75 | Ht 63.0 in | Wt 258.0 lb

## 2014-12-05 DIAGNOSIS — J449 Chronic obstructive pulmonary disease, unspecified: Secondary | ICD-10-CM | POA: Diagnosis not present

## 2014-12-05 DIAGNOSIS — M545 Low back pain, unspecified: Secondary | ICD-10-CM

## 2014-12-05 DIAGNOSIS — E1343 Other specified diabetes mellitus with diabetic autonomic (poly)neuropathy: Secondary | ICD-10-CM

## 2014-12-05 DIAGNOSIS — H9203 Otalgia, bilateral: Secondary | ICD-10-CM

## 2014-12-05 DIAGNOSIS — M5440 Lumbago with sciatica, unspecified side: Secondary | ICD-10-CM

## 2014-12-05 DIAGNOSIS — E1342 Other specified diabetes mellitus with diabetic polyneuropathy: Secondary | ICD-10-CM | POA: Diagnosis not present

## 2014-12-05 DIAGNOSIS — E118 Type 2 diabetes mellitus with unspecified complications: Secondary | ICD-10-CM

## 2014-12-05 DIAGNOSIS — G629 Polyneuropathy, unspecified: Secondary | ICD-10-CM

## 2014-12-05 DIAGNOSIS — E1142 Type 2 diabetes mellitus with diabetic polyneuropathy: Secondary | ICD-10-CM

## 2014-12-05 LAB — POCT GLYCOSYLATED HEMOGLOBIN (HGB A1C): HEMOGLOBIN A1C: 9.3

## 2014-12-05 LAB — POCT UA - MICROALBUMIN
Creatinine, POC: 50 mg/dL
Microalbumin Ur, POC: 10 mg/L

## 2014-12-05 MED ORDER — HYDROCODONE-ACETAMINOPHEN 5-325 MG PO TABS: ORAL_TABLET | ORAL | Status: DC

## 2014-12-05 MED ORDER — METFORMIN HCL 1000 MG PO TABS: 1000.0000 mg | ORAL_TABLET | Freq: Two times a day (BID) | ORAL | Status: DC

## 2014-12-05 MED ORDER — INSULIN GLARGINE 100 UNIT/ML SOLOSTAR PEN: PEN_INJECTOR | SUBCUTANEOUS | Status: DC

## 2014-12-05 MED ORDER — BUDESONIDE-FORMOTEROL FUMARATE 160-4.5 MCG/ACT IN AERO: 2.0000 | INHALATION_SPRAY | Freq: Two times a day (BID) | RESPIRATORY_TRACT | Status: DC

## 2014-12-05 MED ORDER — LIRAGLUTIDE 18 MG/3ML ~~LOC~~ SOPN: 1.2000 mg | PEN_INJECTOR | Freq: Every day | SUBCUTANEOUS | Status: DC

## 2014-12-05 NOTE — Progress Notes (Signed)
Subjective:    Patient ID: Rachael Fee, female    DOB: 1955/10/30, 59 y.o.   MRN: [id number]  HPI  Patient presents to the clinic for diabetic follow-up in COPD med change.  Diabetes-patient is on multiple medications to control her diabetes. 3 months ago we switched her to toujeo she got up to 50 units. She did not like Tuesday. She felt like her sugars were more out of control. She also felt like they were random. Her fastings range from 170-220. Before meals she ranges from 200-300. She would like to switch back to Lantus. She continues to take NovoLog 60 units at every meal. She continues to take Victoza and metformin daily. We had tried glyxambi in the past but pt reported rash and stopped. Unsure if rash came from glyxambi. Patient certainly has numerous diabetic complications such as gastroparesis in peripheral neuropathy. She does have a gastroenterology appointment March 3 for gastroparesis and diverticulitis exacerbation. Last eye exam was 2014. Denies any hypoglycemia  Low back pain with radiation- wants norco refilled.    COPD-patient feels very controlled in this area. She got a letter from her insurance and they will no longer cover Advair. They said they will cover Symbicort. She would like a prescription today. She rarely ever uses her rescue inhaler. She does have one on hand.  Patient continues to have some bilateral ear pain. She went to urgent care and was diagnosed with otitis media. She was started on antibiotic. She remains on antibiotic. She does feel like it is some better but still having some pain. Denies any fever, chills, sinus pressure.  Review of Systems  All other systems reviewed and are negative.      Objective:   Physical Exam  Constitutional: She is oriented to person, place, and time. She appears well-developed and well-nourished.  Obese.   HENT:  Head: Normocephalic and atraumatic.  Right Ear: External ear normal.  Left Ear: External ear normal.   TM's clear today. No signs of infection.   Cardiovascular: Normal rate, regular rhythm and normal heart sounds.   Pulmonary/Chest: Effort normal and breath sounds normal. She has no wheezes.  Neurological: She is alert and oriented to person, place, and time.  Skin: Skin is dry.  Psychiatric: She has a normal mood and affect. Her behavior is normal.          Assessment & Plan:  DM, type II, uncontrolled- .Marland Kitchen Lab Results  Component Value Date   HGBA1C 9.3 12/05/2014   Last summer a1c was almost at goal just above 7. I know winter happened and patient has been sick and not eating right. We tried switching lantus to toujeo but numbers increased and patient felt like her sugars were everywhere. i do not think from toujeo nonetheless pt wants to go back on lanuts.  Medications today: Lantus bid novolog at meals victoza Metformin Will try glyxambi again. She thought she got a rash with it but noticed rash did not go away until long after started medication. She has left overs at home. Start and see if any reactions. Continue to check sugars and see if numbners trending down.   If no improvement over next 3 months discussed with patient referral to diabetic specialist/endocrinology to manage.   Pt reports no money for eye exam right now.  See foot exam pt certainly has neuropathy.   Bilateral ear pain/bilateral otitis media- still on abx. No sign of infection today. If pain persist follow up with ENT  as urgent care suggested.   COPD- insurance will not pay for advair. Per HO pt brought in will pay for symbicort. Sent to pharmacy. Follow up if symptoms become uncontrolled otherwise 6 months. Discussed with patient needs spironmetry has not had in a while.

## 2014-12-05 NOTE — Patient Instructions (Signed)
Try glyxambi once a day.  Switched back to lantus.

## 2014-12-09 DIAGNOSIS — J449 Chronic obstructive pulmonary disease, unspecified: Secondary | ICD-10-CM | POA: Insufficient documentation

## 2014-12-09 DIAGNOSIS — E1142 Type 2 diabetes mellitus with diabetic polyneuropathy: Secondary | ICD-10-CM | POA: Insufficient documentation

## 2014-12-09 DIAGNOSIS — J4489 Other specified chronic obstructive pulmonary disease: Secondary | ICD-10-CM | POA: Insufficient documentation

## 2014-12-09 DIAGNOSIS — H9209 Otalgia, unspecified ear: Secondary | ICD-10-CM | POA: Insufficient documentation

## 2014-12-18 ENCOUNTER — Encounter: Payer: Self-pay | Admitting: Internal Medicine

## 2014-12-18 ENCOUNTER — Ambulatory Visit (INDEPENDENT_AMBULATORY_CARE_PROVIDER_SITE_OTHER): Payer: Medicare Other | Admitting: Internal Medicine

## 2014-12-18 VITALS — BP 124/62 | HR 76 | Ht 63.25 in | Wt 255.5 lb

## 2014-12-18 DIAGNOSIS — E1143 Type 2 diabetes mellitus with diabetic autonomic (poly)neuropathy: Secondary | ICD-10-CM

## 2014-12-18 DIAGNOSIS — R143 Flatulence: Secondary | ICD-10-CM | POA: Diagnosis not present

## 2014-12-18 DIAGNOSIS — R1032 Left lower quadrant pain: Secondary | ICD-10-CM | POA: Diagnosis not present

## 2014-12-18 DIAGNOSIS — K5901 Slow transit constipation: Secondary | ICD-10-CM | POA: Diagnosis not present

## 2014-12-18 DIAGNOSIS — K5732 Diverticulitis of large intestine without perforation or abscess without bleeding: Secondary | ICD-10-CM | POA: Diagnosis not present

## 2014-12-18 DIAGNOSIS — IMO0001 Reserved for inherently not codable concepts without codable children: Secondary | ICD-10-CM

## 2014-12-18 DIAGNOSIS — K219 Gastro-esophageal reflux disease without esophagitis: Secondary | ICD-10-CM

## 2014-12-18 DIAGNOSIS — K3184 Gastroparesis: Secondary | ICD-10-CM

## 2014-12-18 MED ORDER — ONDANSETRON HCL 4 MG PO TABS: 4.0000 mg | ORAL_TABLET | ORAL | Status: DC | PRN

## 2014-12-18 MED ORDER — METRONIDAZOLE 250 MG PO TABS: 250.0000 mg | ORAL_TABLET | Freq: Three times a day (TID) | ORAL | Status: DC

## 2014-12-18 NOTE — Patient Instructions (Signed)
We have sent the following medications to your pharmacy for you to pick up at your convenience:  Metronidazole, Zofran  Try to eat smaller, more frequent meals.  You may use Miralax (Glycolax) as discussed with Dr. Henrene Pastor.  Continue to work on controlling your diabetes for stomach performance

## 2014-12-18 NOTE — Progress Notes (Signed)
HISTORY OF PRESENT ILLNESS:  [name] Rice is a 59 y.o. female  who was last evaluated in the office 03/26/2014. From that encounter " with multiple significant medical problems including morbid obesity, COPD, asthma, poorly controlled diabetes mellitus, hypertension, hyperlipidemia, chronic back pain, osteoarthritis, nephrolithiasis, sleep apnea on CPAP, and diverticulosis complicated by diverticulitis. She is sent by her primary provider regarding problems with abdominal pain and abnormal CT scan. Patient has undergone multiple prior colonoscopies including 1998, 2004, and most recently in 2010. Most recent colonoscopies with hyperplastic polyps only. Sigmoid diverticulosis present. She also has a history of GERD for which she is on pantoprazole. Previous upper endoscopy in 2004 was normal. Since she was last evaluated here, she was hospitalized November 2013 with sigmoid diverticulitis. She had an additional flare about a year later which was treated as outpatient. Most recently, was seen in February for recurrent left-sided pain. A CT scan of the abdomen and pelvis was obtained (reviewed) which suggested mild acute diverticulitis as well as short intussusception in that area. She does report chronic superficial left upper quadrant pain. She has had this for years. Previously had a lipoma removed from that area without improvement in pain. Currently, her bowel habits are regular. No bleeding. She has received several courses of antibiotics when she has had diverticulitis. Currently, states she is feeling much better with no left lower cautery pain. GI review of systems is otherwise remarkable for increased bloating with gas and belching as well as occasional fecal incontinence. Review of laboratory shows last hemoglobin A1c was 8.0. Hemoglobin one year ago was normal at 13.3. Overall she has poor exercise tolerance and limited mobility" She presents today, at the request of her PCP, with multiple complaints  to be evaluated. These include bili diagnosed gastroparesis, problems with increased intestinal gas, troubles with constipation, and chronic abdominal pain. After her last visit, repeat CT scan showed improved possible left colon intussusception. We arranged virtual colonoscopy, but she did not follow through. Since that time she reports having had 2 episodes of left lower quadrant pain which was diagnosed as diverticulitis and she was provided antibiotics. However, on the second occasion she underwent CT scan of the abdomen and pelvis in [place] on 08/22/2014. This was unremarkable. Diverticulosis without diverticulitis present. No intussusception. She continues with intermittent left sided pain without fevers. Manageable. Also chronic constipation. Though she moves her bowels daily, small and unsatisfactory. She did undergo gastric empty scan which demonstrated gastroparesis. She has had diabetes for 16 years and has peripheral neuropathy. Her diabetic control recently has been poor with her last hemoglobin A1c greater than 9. Also, she is on chronic narcotics for chronic pain syndrome. She has been trying to lose weight and has done so modestly. She does notice that defecation improved her pain. She also complains of "terrible gas" with flatus and belching. Finally, she does mention intermittent problems with nausea for which Zofran has been helpful. She requests medication refill of that drug.  REVIEW OF SYSTEMS:  All non-GI ROS negative except for sinus and allergy trouble, arthritis, back pain, visual change, depression, fatigue, hearing problems, muscle cramps, night sweats, shortness of breath, sleeping problems, swollen glands, excessive thirst,   Past Medical History  Diagnosis Date  . Anxiety   . Arthritis   . Asthma     no PFTs  . COPD (chronic obstructive pulmonary disease)     no PFTs  . Depression   . Diabetes mellitus   . GERD (gastroesophageal reflux disease)   .  Hypertension    . HLD (hyperlipidemia)   . Dizziness   . History of colonic polyps     hyperplastic  . Lumbar pain 2011    per x-ray - Multilevel spondylosis  . Cervical dystonia   . Tremor [place]  . Torn rotator cuff RT SHOULDER  . Diarrhea   . History of kidney stones   . Recurrent boils     "SLUSTER BOILS" ON ABD AND LABIA  . History of seborrheic dermatitis     face,ears  . OSA on CPAP 2008  . Sleep apnea     USES C-PAP  . Diverticulosis   . Diverticulitis   . Pneumonia   . Morbid obesity   . Nephrolithiasis   . Gastroparesis   . Intussusception     Past Surgical History  Procedure Laterality Date  . Pilonidal cyst excision  1324,4010  . Tubal ligation    . Lumbar laminectomy    . Carpal tunnel release Left 2006  . Elbow surgery Left   . Knee arthroscopy Left   . Nasal sinus surgery    . Hand surgery Right RIGHT  . Joint replacement Right     shoulder  . Mass excision  06/28/2012    Procedure: EXCISION MASS;  Surgeon: Harl Bowie, MD;  Location: [facility];  Service: General;  Laterality: Left;  excision left upper quadrant abdominal wall mass, Benign Lipoma    Social History [name] Rice  reports that she quit smoking about 18 years ago. Her smoking use included Cigarettes. She has never used smokeless tobacco. She reports that she does not drink alcohol or use illicit drugs.  family history includes Depression in her brother, brother, and sister; Diabetes in her brother, brother, mother, and sister; Heart attack in her brother and brother; Heart disease in her brother; Hyperlipidemia in her brother, brother, mother, and sister; Hypertension in her brother, brother, mother, and sister; Kidney cancer in her brother; Kidney disease in her mother; Stroke in her sister. There is no history of Cancer.  Allergies  Allergen Reactions  . Ace Inhibitors Other (See Comments)    Causes blood pressure to drop rapidly  . Cefaclor Hives and Itching  . Shrimp  [Shellfish Allergy] Nausea And Vomiting  . Ceclor [Cefaclor]   . Glyxambi [Empagliflozin-Linagliptin]     Itching/rash  . Sulfa Antibiotics   . Sulfonamide Derivatives Rash       PHYSICAL EXAMINATION: Vital signs: BP 124/62 mmHg  Pulse 76  Ht 5' 3.25" (1.607 m)  Wt 255 lb 8 oz (115.894 kg)  BMI 44.88 kg/m2  LMP 10/18/1995 Examined in chair General: Morbidly obese, chronically ill-appearing, no acute distress HEENT: Sclerae are anicteric, conjunctiva pink. Oral mucosa intact. No thrush Lungs: Clear to auscultation and percussion Heart: Regular without obvious murmur Abdomen: soft, markedly obese, nontender, nondistended, no obvious ascites, no peritoneal signs, normal bowel sounds. No organomegaly. Extremities: Trace edema of the ankles bilaterally Psychiatric: alert and oriented x3. Cooperative   ASSESSMENT:  #1. Chronic left lower quadrant pain. I suspect this may be related to constipation or diverticular spasm more than diverticulitis. #2. Constipation certainly secondary to slow transit #3. Gastroparesis secondary to long-standing diabetes, poor diabetic control, and narcotics #4. Chronic GERD. Classic symptoms controlled with PPI #5. Increased intestinal gas. Suspect she has an element of bacterial overgrowth #6. Multiple prior colonoscopies. Most recently 2010 without neoplasia #7. Diverticulosis with a history of diverticulitis #8. Morbid obesity #9. Multiple significant medical problems #10. Nausea.  Ongoing   PLAN:  #1. MiraLAX daily to improve constipation and help lower quadrant discomfort. Reviewed with her the proper way to titrate #2. Better control of diabetes to improve gastroparesis #3. More frequent small meals to assist with gastroparetic symptoms #4. Continue PPI #5. Limit narcotics #6. Prescribe Zofran 4 mg every 4 hours as needed for nausea. Refills provided #7. Prescribe metronidazole 250 mg 3 times a day 10 days for possible bacterial  overgrowth. Refills provided for on demand use if helpful #8. Routine GI follow-up in 1 year. Sooner if needed  A copy has been sent to the patient's primary provider

## 2014-12-30 ENCOUNTER — Other Ambulatory Visit: Payer: Self-pay | Admitting: *Deleted

## 2014-12-30 DIAGNOSIS — H8110 Benign paroxysmal vertigo, unspecified ear: Secondary | ICD-10-CM

## 2014-12-30 MED ORDER — MECLIZINE HCL 25 MG PO TABS: 25.0000 mg | ORAL_TABLET | Freq: Three times a day (TID) | ORAL | Status: DC | PRN

## 2015-01-01 ENCOUNTER — Telehealth: Payer: Self-pay | Admitting: Internal Medicine

## 2015-01-01 MED ORDER — CIPROFLOXACIN HCL 500 MG PO TABS: 500.0000 mg | ORAL_TABLET | Freq: Two times a day (BID) | ORAL | Status: DC

## 2015-01-01 MED ORDER — METRONIDAZOLE 500 MG PO TABS: 500.0000 mg | ORAL_TABLET | Freq: Two times a day (BID) | ORAL | Status: DC

## 2015-01-01 NOTE — Telephone Encounter (Signed)
Spoke with pt and she is aware. Pt scheduled to see Alonza Bogus PA 01/10/15@1 :30pm and scripts sent to pharmacy.

## 2015-01-01 NOTE — Telephone Encounter (Signed)
Treat her with ciprofloxacin 500 mg twice a day and metronidazole 500 mg twice a day 1 week. Set her up to see an extender next week if she is still having problems. If not she can cancel

## 2015-01-01 NOTE — Telephone Encounter (Signed)
Pt was seen 12/18/14 and was placed on Flagyl for possible bacterial overgrowth. Pt states she was feeling better but on Tuesday she started having abdominal pain again and vomiting. Pt has taken zofran and that has helped for the nausea. Pt states she thinks she is having a diverticulitis flare and wonders if she needs to take cipro again. Please advise.

## 2015-01-04 ENCOUNTER — Other Ambulatory Visit: Payer: Self-pay | Admitting: Family Medicine

## 2015-01-04 ENCOUNTER — Other Ambulatory Visit: Payer: Self-pay | Admitting: Physician Assistant

## 2015-01-07 DIAGNOSIS — H93293 Other abnormal auditory perceptions, bilateral: Secondary | ICD-10-CM | POA: Diagnosis not present

## 2015-01-08 ENCOUNTER — Encounter: Payer: Self-pay | Admitting: Gastroenterology

## 2015-01-08 ENCOUNTER — Ambulatory Visit (INDEPENDENT_AMBULATORY_CARE_PROVIDER_SITE_OTHER): Payer: Medicare Other | Admitting: Gastroenterology

## 2015-01-08 VITALS — BP 128/72 | HR 80 | Resp 18 | Ht 63.0 in | Wt 253.0 lb

## 2015-01-08 DIAGNOSIS — R197 Diarrhea, unspecified: Secondary | ICD-10-CM | POA: Diagnosis not present

## 2015-01-08 NOTE — Patient Instructions (Addendum)
Please purchase the following medications over the counter and take as directed: Florastor BID or any other Probiotic  Please follow a Blad/BRAT diet and increase your fluid intake  Your physician has requested that you go to the basement for the following lab work before leaving today: GI path

## 2015-01-12 ENCOUNTER — Encounter: Payer: Self-pay | Admitting: Gastroenterology

## 2015-01-12 DIAGNOSIS — R197 Diarrhea, unspecified: Secondary | ICD-10-CM | POA: Insufficient documentation

## 2015-01-12 NOTE — Progress Notes (Signed)
01/08/2015 [name] Rice [id number] [birth year]   History of Present Illness:  This is a 59 year old female who was just recently seen in the office by Dr. Henrene Pastor on 12/18/2014.  She has multiple significant medical problems including morbid obesity, COPD, asthma, poorly controlled diabetes mellitus, hypertension, hyperlipidemia, chronic back pain, osteoarthritis, nephrolithiasis, sleep apnea on CPAP, and diverticulosis complicated by diverticulitis in the past.  Patient has undergone multiple prior colonoscopies including 1998, 2004, and most recently in 2010. Most recent colonoscopies with hyperplastic polyps only. Sigmoid diverticulosis present. She also has a history of GERD for which she is on pantoprazole and gastroparesis due to poorly controlled diabetes and narcotic/medication use. Previous upper endoscopy in 2004 was normal.  There was some question of colonic intussusception on previous CT scans and she was scheduled for a virtual colonoscopy, however, she never had that study performed.  Subsequent CT scans for abdominal pain showed decreased intussusception and then no intussusception.  When she was seen by Dr. Henrene Pastor earlier this month she was treated with a 10 day course of flagyl 250 mg TID for possible intestinal bacterial overgrowth after reporting issues with increased intestinal gas.  Complained of ongoing issues with left sided abdominal pain that was thought to be intestinal spasm or related to her chronic constipation rather than diverticulitis.  Was placed on Miralax as well.  She is here today with complaints of diarrhea with nausea and vomiting for the past 10 days.  As stated above, she usually has constipation at baseline.  Has some lower abdominal pain/cramping as well.  Overall the vomiting and pain have been better.  Still has some nausea, but suffers with chronic nausea due to gastroparesis anyway.  Still having some diarrhea/loose stools.  Has some urgency.  No blood.  Just  feels tired.  Admits that she was on amoxicillin recently for sinus/upper respiratory issues.  She had called here on 3/17 with complaints of abdominal pain and vomiting; requested to be treated for diverticulitis so was given 7 day course of cipro 500 mg BID and flagyl 500 mg BID.  Is her today for follow-up, and reports the above.   Current Medications, Allergies, Past Medical History, Past Surgical History, Family History and Social History were reviewed in [employer] record.   Physical Exam: BP 128/72 mmHg  Pulse 80  Resp 18  Ht 5\' 3"  (1.6 m)  Wt 253 lb (114.76 kg)  BMI 44.83 kg/m2  LMP 10/18/1995 General:  Morbidly obese, chronically ill-appearing female in no acute distress Head: Normocephalic and atraumatic Eyes:  Sclerae anicteric, conjunctiva pink  Ears: Normal auditory acuity Lungs: Clear throughout to auscultation Heart: Regular rate and rhythm Abdomen: Soft, obese, non-distended.  BS present.  Minimal diffuse lower abdominal TTP. Musculoskeletal: Symmetrical with no gross deformities  Extremities: No edema  Neurological: Alert oriented x 4, grossly non-focal Psychological:  Alert and cooperative. Normal mood and affect  Assessment and Recommendations: #1. Diarrhea:  New, acute onset.  Usually has constipation.  Suspect that with the nausea and vomiting that she had some type of infectious gastroenteritis.  Cdiff a consideration as well since she was on amoxicillin prior to the onset of the diarrhea. #2 Chronic left lower quadrant pain:  Do not suspect diverticulitis at this time. #3. Gastroparesis secondary to long-standing diabetes, poor diabetic control, and narcotics #4. Chronic GERD. Classic symptoms controlled with PPI. #5. Increased intestinal gas:  Recently treated with 10 day course of Flagyl for suspected bacterial  overgrowth. #6. Multiple prior colonoscopies. Most recently 2010 without neoplasia. #7. Diverticulosis with a history of  diverticulitis #8. Morbid obesity #9. Multiple significant medical problems #10. Nausea:  Ongoing.  -Will check stool GI pathogen panel.   -Will start a daily probiotic.  Strongly encouraged Florastor BID. -Bland/brat/low fiber type diet until diarrhea improved/resolved. -Other treatment for her chronic GI conditions per Dr. Blanch Media recent recommendations.

## 2015-01-13 ENCOUNTER — Other Ambulatory Visit: Payer: Self-pay | Admitting: *Deleted

## 2015-01-13 ENCOUNTER — Other Ambulatory Visit: Payer: Medicare Other

## 2015-01-13 DIAGNOSIS — R197 Diarrhea, unspecified: Secondary | ICD-10-CM

## 2015-01-13 MED ORDER — HYDROCODONE-ACETAMINOPHEN 5-325 MG PO TABS: ORAL_TABLET | ORAL | Status: DC

## 2015-01-13 NOTE — Progress Notes (Signed)
Agree 

## 2015-01-14 LAB — GASTROINTESTINAL PATHOGEN PANEL PCR
C. difficile Tox A/B, PCR: NEGATIVE
CAMPYLOBACTER, PCR: NEGATIVE
CRYPTOSPORIDIUM, PCR: NEGATIVE
E coli (ETEC) LT/ST PCR: NEGATIVE
E coli (STEC) stx1/stx2, PCR: NEGATIVE
E coli 0157, PCR: NEGATIVE
GIARDIA LAMBLIA, PCR: NEGATIVE
NOROVIRUS, PCR: NEGATIVE
ROTAVIRUS, PCR: NEGATIVE
SHIGELLA, PCR: NEGATIVE
Salmonella, PCR: NEGATIVE

## 2015-01-30 ENCOUNTER — Other Ambulatory Visit: Payer: Self-pay | Admitting: Physician Assistant

## 2015-02-13 ENCOUNTER — Other Ambulatory Visit: Payer: Self-pay

## 2015-02-13 MED ORDER — HYDROCODONE-ACETAMINOPHEN 5-325 MG PO TABS: ORAL_TABLET | ORAL | Status: DC

## 2015-02-28 ENCOUNTER — Other Ambulatory Visit: Payer: Self-pay | Admitting: Physician Assistant

## 2015-03-06 ENCOUNTER — Encounter: Payer: Self-pay | Admitting: Family Medicine

## 2015-03-06 ENCOUNTER — Ambulatory Visit (INDEPENDENT_AMBULATORY_CARE_PROVIDER_SITE_OTHER): Payer: Medicare Other | Admitting: Family Medicine

## 2015-03-06 DIAGNOSIS — F99 Mental disorder, not otherwise specified: Secondary | ICD-10-CM

## 2015-03-06 NOTE — Progress Notes (Signed)
No show 03/06/2015

## 2015-03-12 ENCOUNTER — Telehealth: Payer: Self-pay | Admitting: *Deleted

## 2015-03-12 ENCOUNTER — Other Ambulatory Visit: Payer: Self-pay | Admitting: *Deleted

## 2015-03-12 MED ORDER — GABAPENTIN 800 MG PO TABS: 800.0000 mg | ORAL_TABLET | Freq: Three times a day (TID) | ORAL | Status: DC

## 2015-03-12 MED ORDER — HYDROCODONE-ACETAMINOPHEN 5-325 MG PO TABS: ORAL_TABLET | ORAL | Status: DC

## 2015-03-12 NOTE — Telephone Encounter (Signed)
Pt notified to pick up rx at the front desk & will schedule an appt with Dr. Darene Lamer while she is here for back pain.

## 2015-03-12 NOTE — Telephone Encounter (Signed)
Looks like okay to refill both of those. Also is having persistent/chronic back pain it may be reasonable to have her see me to see if there is a long term interventional option.

## 2015-03-12 NOTE — Telephone Encounter (Signed)
Pt left vm stating that she is out of her gabapentin & is due for a refill on her hydrocodone.  She is Jade's pt and has an appt to f/u with you next Tues.  Are you ok with me refilling both of these for her?  Please advise.

## 2015-03-17 ENCOUNTER — Encounter: Payer: Self-pay | Admitting: Family Medicine

## 2015-03-17 ENCOUNTER — Ambulatory Visit (INDEPENDENT_AMBULATORY_CARE_PROVIDER_SITE_OTHER): Payer: Medicare Other | Admitting: Family Medicine

## 2015-03-17 ENCOUNTER — Other Ambulatory Visit: Payer: Self-pay | Admitting: Physician Assistant

## 2015-03-17 VITALS — BP 152/79 | HR 90 | Wt 255.0 lb

## 2015-03-17 DIAGNOSIS — E1140 Type 2 diabetes mellitus with diabetic neuropathy, unspecified: Secondary | ICD-10-CM

## 2015-03-17 DIAGNOSIS — E118 Type 2 diabetes mellitus with unspecified complications: Secondary | ICD-10-CM | POA: Diagnosis not present

## 2015-03-17 DIAGNOSIS — G5702 Lesion of sciatic nerve, left lower limb: Secondary | ICD-10-CM | POA: Diagnosis not present

## 2015-03-17 LAB — POCT GLYCOSYLATED HEMOGLOBIN (HGB A1C): Hemoglobin A1C: 8.7

## 2015-03-17 MED ORDER — GLUCOSE BLOOD VI STRP: ORAL_STRIP | Status: DC

## 2015-03-17 MED ORDER — AMBULATORY NON FORMULARY MEDICATION: Status: DC

## 2015-03-17 MED ORDER — GABAPENTIN 800 MG PO TABS: 800.0000 mg | ORAL_TABLET | Freq: Three times a day (TID) | ORAL | Status: DC

## 2015-03-17 MED ORDER — PIOGLITAZONE HCL 30 MG PO TABS: 30.0000 mg | ORAL_TABLET | Freq: Every day | ORAL | Status: DC

## 2015-03-17 NOTE — Progress Notes (Signed)
CC: [name] Rice is a 59 y.o. female is here for Diabetes   Subjective: HPI:  Follow-up type 2 diabetes: Fasting blood sugars 120, postprandial blood sugars are ranging from 180-250. Denies polyuria polyphagia or polydipsia. Denies hypoglycemic episodes. Admits increase caloric intake ever since her daughter moved in with her one month ago. No chest pain shortness of breath orthopnea nor peripheral edema. Requesting refills on gabapentin, she ran out of this medication a few weeks ago and was without it for 2 or 3 days before new refill was available. During this time she had complete return of burning in both soles of her feet. Discomfort was alleviated after restarting gabapentin and now absent  Complains of left buttock pain that radiates down the back of the left leg into the great toe. Symptoms worse after standing for long. The time. Symptoms are improved with ice pack. Nothing else seems to make better or worse. Symptoms are present for matter of years but went from mild to moderate in severity over the past 2 weeks. No interventions as of yet.   Review Of Systems Outlined In HPI  Past Medical History  Diagnosis Date  . Anxiety   . Arthritis   . Asthma     no PFTs  . COPD (chronic obstructive pulmonary disease)     no PFTs  . Depression   . Diabetes mellitus   . GERD (gastroesophageal reflux disease)   . Hypertension   . HLD (hyperlipidemia)   . Dizziness   . History of colonic polyps     hyperplastic  . Lumbar pain 2011    per x-ray - Multilevel spondylosis  . Cervical dystonia   . Tremor [place]  . Torn rotator cuff RT SHOULDER  . Diarrhea   . History of kidney stones   . Recurrent boils     "SLUSTER BOILS" ON ABD AND LABIA  . History of seborrheic dermatitis     face,ears  . OSA on CPAP 2008  . Sleep apnea     USES C-PAP  . Diverticulosis   . Diverticulitis   . Pneumonia   . Morbid obesity   . Nephrolithiasis   . Gastroparesis   .  Intussusception     Past Surgical History  Procedure Laterality Date  . Pilonidal cyst excision  0932,6712  . Tubal ligation    . Lumbar laminectomy    . Carpal tunnel release Left 2006  . Elbow surgery Left   . Knee arthroscopy Left   . Nasal sinus surgery    . Hand surgery Right RIGHT  . Joint replacement Right     shoulder  . Mass excision  06/28/2012    Procedure: EXCISION MASS;  Surgeon: Harl Bowie, MD;  Location: [facility];  Service: General;  Laterality: Left;  excision left upper quadrant abdominal wall mass, Benign Lipoma   Family History  Problem Relation Age of Onset  . Heart disease Brother   . Heart attack Brother   . Depression Brother   . Diabetes Brother   . Hypertension Brother   . Hyperlipidemia Brother   . Cancer Neg Hx   . Diabetes Mother   . Hyperlipidemia Mother   . Hypertension Mother   . Depression Sister   . Diabetes Sister   . Hyperlipidemia Sister   . Hypertension Sister   . Stroke Sister   . Heart attack Brother   . Depression Brother   . Diabetes Brother   . Hyperlipidemia Brother   .  Hypertension Brother   . Kidney disease Mother   . Kidney cancer Brother     History   Social History  . Marital Status: Widowed    Spouse Name: N/A  . Number of Children: 1  . Years of Education: N/A   Occupational History  . disabled    Social History Main Topics  . Smoking status: Former Smoker    Types: Cigarettes    Quit date: 10/17/1996  . Smokeless tobacco: Never Used  . Alcohol Use: No  . Drug Use: No  . Sexual Activity: Not Currently   Other Topics Concern  . Not on file   Social History Narrative   Lives alone in [place].      Objective: BP 152/79 mmHg  Pulse 90  Wt 255 lb (115.667 kg)  LMP 10/18/1995  Vital signs reviewed. General: Alert and Oriented, No Acute Distress HEENT: Pupils equal, round, reactive to light. Conjunctivae clear.  External ears unremarkable.  Moist mucous membranes. Lungs: Clear and comfortable work  of breathing, speaking in full sentences without accessory muscle use. Cardiac: Regular rate and rhythm.  Neuro: CN II-XII grossly intact, gait normal. Extremities: No peripheral edema.  Strong peripheral pulses.  Mental Status: No depression, anxiety, nor agitation. Logical though process. Skin: Warm and dry.  Assessment & Plan: [name] Rice was seen today for diabetes.  Diagnoses and all orders for this visit:  Type 2 diabetes mellitus with complication Orders: -     POCT HgB A1C -     pioglitazone (ACTOS) 30 MG tablet; Take 1 tablet (30 mg total) by mouth daily.  Piriformis syndrome, left Orders: -     pioglitazone (ACTOS) 30 MG tablet; Take 1 tablet (30 mg total) by mouth daily.  Type 2 diabetes mellitus with diabetic neuropathy Orders: -     gabapentin (NEURONTIN) 800 MG tablet; Take 1 tablet (800 mg total) by mouth 3 (three) times daily.  Other orders -     AMBULATORY NON FORMULARY MEDICATION; Test strips and needles for one touch Verio IQ glucometer.  Use to test blood sugar up to four times a day. Diagnosis Type 2 Diabetes   Type 2 diabetes: Uncontrolled A1c is 8.6, continue all current anti-hyperglycemic medication and begin Actos. Piriformis syndrome: Given home rehabilitation exercises to be performed on a daily basis for the next 2 or 3 weeks. I've asked her to call me if ineffective and will arrange home health physical therapy.  Return in about 3 months (around 06/17/2015).

## 2015-04-13 ENCOUNTER — Other Ambulatory Visit: Payer: Self-pay | Admitting: *Deleted

## 2015-04-13 ENCOUNTER — Telehealth: Payer: Self-pay | Admitting: Family Medicine

## 2015-04-13 MED ORDER — HYDROCODONE-ACETAMINOPHEN 5-325 MG PO TABS: ORAL_TABLET | ORAL | Status: DC

## 2015-04-13 NOTE — Telephone Encounter (Signed)
Pt wanted to let Dr. Ileene Rubens know the Actos Rx is "working great." Also, she has not been able to do the exercises provided because she needs someone to help her, mentioned an order for home assistance but said her daughter is going to come and help her with them.

## 2015-05-11 ENCOUNTER — Other Ambulatory Visit: Payer: Self-pay | Admitting: *Deleted

## 2015-05-11 MED ORDER — HYDROCODONE-ACETAMINOPHEN 5-325 MG PO TABS: ORAL_TABLET | ORAL | Status: DC

## 2015-05-24 ENCOUNTER — Other Ambulatory Visit: Payer: Self-pay | Admitting: Physician Assistant

## 2015-05-31 ENCOUNTER — Other Ambulatory Visit: Payer: Self-pay | Admitting: Physician Assistant

## 2015-05-31 DIAGNOSIS — E118 Type 2 diabetes mellitus with unspecified complications: Secondary | ICD-10-CM

## 2015-06-01 MED ORDER — LIRAGLUTIDE 18 MG/3ML ~~LOC~~ SOPN: 1.2000 mg | PEN_INJECTOR | Freq: Every day | SUBCUTANEOUS | Status: DC

## 2015-06-04 ENCOUNTER — Other Ambulatory Visit: Payer: Self-pay | Admitting: Physician Assistant

## 2015-06-17 ENCOUNTER — Encounter: Payer: Self-pay | Admitting: Physician Assistant

## 2015-06-17 ENCOUNTER — Ambulatory Visit (INDEPENDENT_AMBULATORY_CARE_PROVIDER_SITE_OTHER): Payer: Medicare Other | Admitting: Physician Assistant

## 2015-06-17 VITALS — BP 125/61 | HR 76 | Ht 63.0 in | Wt 263.0 lb

## 2015-06-17 DIAGNOSIS — G8929 Other chronic pain: Secondary | ICD-10-CM

## 2015-06-17 DIAGNOSIS — Z23 Encounter for immunization: Secondary | ICD-10-CM | POA: Diagnosis not present

## 2015-06-17 DIAGNOSIS — Z13220 Encounter for screening for lipoid disorders: Secondary | ICD-10-CM | POA: Diagnosis not present

## 2015-06-17 DIAGNOSIS — M549 Dorsalgia, unspecified: Secondary | ICD-10-CM

## 2015-06-17 DIAGNOSIS — L304 Erythema intertrigo: Secondary | ICD-10-CM | POA: Diagnosis not present

## 2015-06-17 DIAGNOSIS — E118 Type 2 diabetes mellitus with unspecified complications: Secondary | ICD-10-CM | POA: Diagnosis not present

## 2015-06-17 DIAGNOSIS — Z131 Encounter for screening for diabetes mellitus: Secondary | ICD-10-CM | POA: Diagnosis not present

## 2015-06-17 LAB — POCT GLYCOSYLATED HEMOGLOBIN (HGB A1C): Hemoglobin A1C: 7

## 2015-06-17 MED ORDER — FLUTICASONE-SALMETEROL 250-50 MCG/DOSE IN AEPB
1.0000 | INHALATION_SPRAY | Freq: Two times a day (BID) | RESPIRATORY_TRACT | Status: DC
Start: 2015-06-17 — End: 2016-08-22

## 2015-06-17 MED ORDER — NYSTATIN 100000 UNIT/GM EX OINT: 1.0000 | TOPICAL_OINTMENT | Freq: Two times a day (BID) | CUTANEOUS | Status: DC

## 2015-06-17 MED ORDER — KETOROLAC TROMETHAMINE 60 MG/2ML IM SOLN
60.0000 mg | Freq: Once | INTRAMUSCULAR | Status: AC
  Administered 2015-06-17: 60 mg via INTRAMUSCULAR

## 2015-06-17 MED ORDER — HYDROCODONE-ACETAMINOPHEN 5-325 MG PO TABS: ORAL_TABLET | ORAL | Status: DC

## 2015-06-17 MED ORDER — NYSTATIN 100000 UNIT/GM EX POWD: Freq: Three times a day (TID) | CUTANEOUS | Status: DC

## 2015-06-17 NOTE — Patient Instructions (Signed)
Keep decreasing insulin as long as low blood sugar. Start with evening dose until get to 20 then start morning dose.   Nystatin for rash.   Yeast Infection of the Skin Some yeast on the skin is normal, but sometimes it causes an infection. If you have a yeast infection, it shows up as white or light brown patches on brown skin. You can see it better in the summer on tan skin. It causes light-colored holes in your suntan. It can happen on any area of the body. This cannot be passed from person to person. HOME CARE  Scrub your skin daily with a dandruff shampoo. Your rash may take a couple weeks to get well.  Do not scratch or itch the rash. GET HELP RIGHT AWAY IF:   You get another infection from scratching. The skin may get warm, red, and may ooze fluid.  The infection does not seem to be getting better. MAKE SURE YOU:  Understand these instructions.  Will watch your condition.  Will get help right away if you are not doing well or get worse. Document Released: 09/15/2008 Document Revised: 12/26/2011 Document Reviewed: 09/15/2008 [facility] Patient Information 2015 [place], [place]. This information is not intended to replace advice given to you by your health care provider. Make sure you discuss any questions you have with your health care provider.

## 2015-06-17 NOTE — Progress Notes (Signed)
   Subjective:    Patient ID: [name] Rice, female    DOB: 05-Jul-1956, 59 y.o.   MRN: [id number]  Diabetes She presents for her follow-up diabetic visit. She has type 2 diabetes mellitus. Her disease course has been improving. Hypoglycemia symptoms include dizziness, nervousness/anxiousness, pallor, sweats and tremors. (She has had some dizziness, sweating, shakiness during the night with low blood sugars. Resolved with eating. ) There are no diabetic associated symptoms. Hypoglycemia complications include nocturnal hypoglycemia. Symptoms are stable. Diabetic complications include peripheral neuropathy. Risk factors for coronary artery disease include sedentary lifestyle, post-menopausal, stress, obesity, hypertension, family history and diabetes mellitus. Current diabetic treatment includes oral agent (triple therapy). She is compliant with treatment most of the time. She is currently taking insulin pre-breakfast and at bedtime. Insulin injections are given by patient. Rotation sites for injection include the abdominal wall. Her weight is stable. She is following a high fat/cholesterol and high salt diet. When asked about meal planning, she reported none. She has not had a previous visit with a dietitian. Frequency home blood tests: mostly just once a day in the morning. Her home blood glucose trend is decreasing steadily. (Fasting when she gets up 100-180.) An ACE inhibitor/angiotensin II receptor blocker is being taken. She does not see a podiatrist.Eye exam is not current.      Chronic pain- needs norco refill. Symptoms unchanged.   She does have rash under the fold of abdomen. Seems to be getting worse. It is red with a slight odor.    Review of Systems  Skin: Positive for pallor.  Neurological: Positive for dizziness and tremors.  Psychiatric/Behavioral: The patient is nervous/anxious.   All other systems reviewed and are negative.      Objective:   Physical Exam  Constitutional: She  is oriented to person, place, and time. She appears well-developed and well-nourished.  Obesity.   HENT:  Head: Normocephalic and atraumatic.  Cardiovascular: Normal rate, regular rhythm and normal heart sounds.   Pulmonary/Chest: Effort normal and breath sounds normal.  Neurological: She is alert and oriented to person, place, and time.  Skin:  Erythematous macular rash from suprapubic area under fold of abdominal skin.   Psychiatric: She has a normal mood and affect. Her behavior is normal.          Assessment & Plan:  DM- .Marland Kitchen Lab Results  Component Value Date   HGBA1C 7.0 06/17/2015   Looking great.  Continue Actos.  Pt is off novolin.  Continue lantus while decreasing at night if continues to have lows during the night. Decrease by 3 units every 2-3 days until no hypoglyemic events and fasting sugars 90-120.  Continue victoza, metformin.  Follow up in 3 months.   Screening labs given.    Flu and pneumonia shot given today.   Chronic back pain- Norco refilled today.   Intertrigo- nystatin ointment and powder given today. Discussed keeping area dry. Cotton panties. Follow up if not improving.

## 2015-06-18 LAB — COMPLETE METABOLIC PANEL WITH GFR
ALBUMIN: 3.5 g/dL — AB (ref 3.6–5.1)
ALK PHOS: 68 U/L (ref 33–130)
ALT: 12 U/L (ref 6–29)
AST: 12 U/L (ref 10–35)
BILIRUBIN TOTAL: 0.3 mg/dL (ref 0.2–1.2)
BUN: 15 mg/dL (ref 7–25)
CALCIUM: 8.4 mg/dL — AB (ref 8.6–10.4)
CO2: 28 mmol/L (ref 20–31)
CREATININE: 0.83 mg/dL (ref 0.50–0.99)
Chloride: 103 mmol/L (ref 98–110)
GFR, Est African American: 88 mL/min (ref >=60)
GFR, Est Non African American: 76 mL/min (ref >=60)
Glucose, Bld: 154 mg/dL — ABNORMAL HIGH (ref 65–99)
POTASSIUM: 4.3 mmol/L (ref 3.5–5.3)
Sodium: 138 mmol/L (ref 135–146)
TOTAL PROTEIN: 6.2 g/dL (ref 6.1–8.1)

## 2015-06-18 LAB — LIPID PANEL
CHOL/HDL RATIO: 2.6 ratio (ref <=5.0)
CHOLESTEROL: 128 mg/dL (ref 125–200)
HDL: 49 mg/dL (ref >=46)
LDL Cholesterol: 61 mg/dL (ref <130)
Triglycerides: 88 mg/dL (ref <150)
VLDL: 18 mg/dL (ref <30)

## 2015-07-03 ENCOUNTER — Other Ambulatory Visit: Payer: Self-pay | Admitting: Physician Assistant

## 2015-07-10 ENCOUNTER — Other Ambulatory Visit: Payer: Self-pay | Admitting: Physician Assistant

## 2015-07-14 ENCOUNTER — Other Ambulatory Visit: Payer: Self-pay | Admitting: Physician Assistant

## 2015-08-10 ENCOUNTER — Other Ambulatory Visit: Payer: Self-pay | Admitting: Physician Assistant

## 2015-08-12 ENCOUNTER — Encounter: Payer: Self-pay | Admitting: Physician Assistant

## 2015-08-12 ENCOUNTER — Ambulatory Visit (INDEPENDENT_AMBULATORY_CARE_PROVIDER_SITE_OTHER): Payer: Medicare Other

## 2015-08-12 ENCOUNTER — Ambulatory Visit (INDEPENDENT_AMBULATORY_CARE_PROVIDER_SITE_OTHER): Payer: Medicare Other | Admitting: Physician Assistant

## 2015-08-12 VITALS — BP 133/53 | HR 63 | Ht 63.0 in | Wt 263.0 lb

## 2015-08-12 DIAGNOSIS — M5442 Lumbago with sciatica, left side: Secondary | ICD-10-CM

## 2015-08-12 DIAGNOSIS — M5441 Lumbago with sciatica, right side: Secondary | ICD-10-CM | POA: Diagnosis not present

## 2015-08-12 DIAGNOSIS — A499 Bacterial infection, unspecified: Secondary | ICD-10-CM

## 2015-08-12 DIAGNOSIS — M545 Low back pain, unspecified: Secondary | ICD-10-CM

## 2015-08-12 DIAGNOSIS — J329 Chronic sinusitis, unspecified: Secondary | ICD-10-CM | POA: Diagnosis not present

## 2015-08-12 DIAGNOSIS — B9689 Other specified bacterial agents as the cause of diseases classified elsewhere: Secondary | ICD-10-CM

## 2015-08-12 DIAGNOSIS — M5440 Lumbago with sciatica, unspecified side: Principal | ICD-10-CM

## 2015-08-12 MED ORDER — AMOXICILLIN-POT CLAVULANATE 875-125 MG PO TABS: 1.0000 | ORAL_TABLET | Freq: Two times a day (BID) | ORAL | Status: DC

## 2015-08-12 MED ORDER — PREDNISONE 10 MG (21) PO TBPK: ORAL_TABLET | ORAL | Status: DC

## 2015-08-12 MED ORDER — OLOPATADINE HCL 0.2 % OP SOLN: 1.0000 [drp] | Freq: Two times a day (BID) | OPHTHALMIC | Status: DC | PRN

## 2015-08-12 MED ORDER — HYDROCODONE-ACETAMINOPHEN 5-325 MG PO TABS: ORAL_TABLET | ORAL | Status: DC

## 2015-08-12 NOTE — Progress Notes (Addendum)
[name] Rice is a 59 y.o. female who presents to [facility]  today for nasal congestion and medication follow-up. [name] Rice states that about 10 days after she received her flu shot on August 31st she began to feel ill with nasal congestion, chills, sore throat and a dry cough. She used fluticasone for 4-5 days followed by Afrin and saline nasal spray which she states helped a little. She now states that she still has the nasal congestion, sore throat and dry cough as well as headache and trouble hearing out of her ears. She states that she cannot use her CPAP at night because she can't breathe out of her nose and as such she has been having trouble sleeping. She denies sinus pressure/pain, fevers, chills, nausea, vomiting, diarrhea and abdominal pain.   [name] Rice states that her Norco pills have been becoming ineffective after 4 hours and she has been having terrible sciatic pain. She states that she takes Norco 3 times per day on average even though she is prescribed it twice daily. Additionally, she states that she rarely has to take two tablets at once. She is also only taking her Robaxin once per day instead of 3 times daily.     Past Medical History  Diagnosis Date  . Anxiety   . Arthritis   . Asthma     no PFTs  . COPD (chronic obstructive pulmonary disease) (HCC)     no PFTs  . Depression   . Diabetes mellitus   . GERD (gastroesophageal reflux disease)   . Hypertension   . HLD (hyperlipidemia)   . Dizziness   . History of colonic polyps     hyperplastic  . Lumbar pain 2011    per x-ray - Multilevel spondylosis  . Cervical dystonia   . Tremor Staples  . Torn rotator cuff RT SHOULDER  . Diarrhea   . History of kidney stones   . Recurrent boils     "SLUSTER BOILS" ON ABD AND LABIA  . History of seborrheic dermatitis     face,ears  . OSA on CPAP 2008  . Sleep apnea     USES C-PAP  . Diverticulosis   . Diverticulitis    . Pneumonia   . Morbid obesity ([place])   . Nephrolithiasis   . Gastroparesis   . Intussusception [facility])    Past Surgical History  Procedure Laterality Date  . Pilonidal cyst excision  1884,1660  . Tubal ligation    . Lumbar laminectomy    . Carpal tunnel release Left 2006  . Elbow surgery Left   . Knee arthroscopy Left   . Nasal sinus surgery    . Hand surgery Right RIGHT  . Joint replacement Right     shoulder  . Mass excision  06/28/2012    Procedure: EXCISION MASS;  Surgeon: Harl Bowie, MD;  Location: [facility];  Service: General;  Laterality: Left;  excision left upper quadrant abdominal wall mass, Benign Lipoma   Social History  Substance Use Topics  . Smoking status: Former Smoker    Types: Cigarettes    Quit date: 10/17/1996  . Smokeless tobacco: Never Used  . Alcohol Use: No   family history includes Depression in her brother, brother, and sister; Diabetes in her brother, brother, mother, and sister; Heart attack in her brother and brother; Heart disease in her brother; Hyperlipidemia in her brother, brother, mother, and sister; Hypertension in her brother, brother, mother, and sister;  Kidney cancer in her brother; Kidney disease in her mother; Stroke in her sister. There is no history of Cancer.  ROS as above Medications: Current Outpatient Prescriptions  Medication Sig Dispense Refill  . albuterol (PROAIR HFA) 108 (90 BASE) MCG/ACT inhaler Inhale 2 puffs into the lungs every 6 (six) hours as needed for wheezing or shortness of breath. 1 Inhaler 11  . allopurinol (ZYLOPRIM) 300 MG tablet take 1 tablet by mouth once daily 30 tablet 5  . AMBULATORY NON FORMULARY MEDICATION Diabetic shoes  Dx: Diabetes, uncontrolled 250.60 with neuropathy. 1 Device 0  . AMBULATORY NON FORMULARY MEDICATION Test strips and needles for one touch Verio IQ glucometer.  Use to test blood sugar up to four times a day. Diagnosis Type 2 Diabetes 100 each 3  . B-D INS SYR ULTRAFINE 1CC/30G 30G  X 1/2" 1 ML MISC use as directed three times a day 100 each 5  . DULoxetine (CYMBALTA) 20 MG capsule take 1 capsule by mouth once daily (TAKE WITH 60MG  TO EQUAL 80MG ) 90 capsule 1  . DULoxetine (CYMBALTA) 60 MG capsule take 1 capsule by mouth once daily (TAKE WITH 20MG  TO EQUAL 80MG ) 90 capsule 1  . Fluticasone-Salmeterol (ADVAIR) 250-50 MCG/DOSE AEPB Inhale 1 puff into the lungs 2 (two) times daily. 60 each 11  . furosemide (LASIX) 40 MG tablet take 1 tablet by mouth once daily 30 tablet 5  . gabapentin (NEURONTIN) 800 MG tablet Take 1 tablet (800 mg total) by mouth 3 (three) times daily. (Patient taking differently: Take 800 mg by mouth 2 (two) times daily. ) 90 tablet 5  . glucose blood (ONETOUCH VERIO) test strip Check blood sugar 3 times daily. Diagnosis: Type II diabetes mellitus with neurological manifestations (E11.49) 100 each 3  . HYDROcodone-acetaminophen (NORCO/VICODIN) 5-325 MG tablet TAKE 1 TO 2 TABLETS BY MOUTH TWICE DAILY 60 tablet 0  . Insulin Pen Needle (NOVOFINE) 32G X 6 MM MISC Inject once daily [place] 100 each 6  . LANTUS 100 UNIT/ML injection INJECT 70 UNITS UNDER THE SKIN EVERY MORNING AND EVERY EVENING (Patient taking differently: INJECT 30 UNITS UNDER THE SKIN EVERY MORNING AND EVERY EVENING) 90 vial 0  . Liraglutide (VICTOZA) 18 MG/3ML SOPN Inject 0.2 mLs (1.2 mg total) into the skin daily. 4 pen 2  . meclizine (ANTIVERT) 25 MG tablet Take 1 tablet (25 mg total) by mouth 3 (three) times daily as needed for dizziness. 30 tablet 0  . metFORMIN (GLUCOPHAGE) 1000 MG tablet take 1 tablet by mouth twice a day with food 180 tablet 0  . methocarbamol (ROBAXIN) 500 MG tablet take 1 tablet by mouth three times a day 270 tablet 0  . metroNIDAZOLE (FLAGYL) 250 MG tablet Take 1 tablet (250 mg total) by mouth 3 (three) times daily. 30 tablet 2  . montelukast (SINGULAIR) 10 MG tablet take 1 tablet by mouth at bedtime 90 tablet 2  . neomycin-polymyxin-hydrocortisone (CORTISPORIN) otic solution  Place 3 drops into both ears 3 (three) times daily as needed (for psoriasis in ear, only as needed).    . nystatin (MYCOSTATIN) powder Apply topically 3 (three) times daily. 60 g 0  . nystatin ointment (MYCOSTATIN) Apply 1 application topically 2 (two) times daily. 60 g 0  . ondansetron (ZOFRAN) 4 MG tablet Take 1 tablet (4 mg total) by mouth every 4 (four) hours as needed for nausea or vomiting. 30 tablet 1  . pantoprazole (PROTONIX) 40 MG tablet Take 1 tablet (40 mg total) by mouth daily. [place]  tablet 3  . pioglitazone (ACTOS) 30 MG tablet Take 1 tablet (30 mg total) by mouth daily. 30 tablet 5  . pravastatin (PRAVACHOL) 40 MG tablet take 1 tablet by mouth once daily 90 tablet 1  . propranolol (INDERAL) 20 MG tablet take 1 tablet by mouth twice a day 60 tablet 2  . triamcinolone cream (KENALOG) 0.1 % Apply 1 application topically 2 (two) times daily. (Patient taking differently: Apply 1 application topically as needed. ) 85.2 g 0  . amoxicillin-clavulanate (AUGMENTIN) 875-125 MG tablet Take 1 tablet by mouth 2 (two) times daily. 20 tablet 0  . Olopatadine HCl 0.2 % SOLN Apply 1 drop to eye 2 (two) times daily as needed. 1 Bottle 3  . predniSONE (STERAPRED UNI-PAK 21 TAB) 10 MG (21) TBPK tablet 12 day taper pack, use as directed 1 tablet 0   No current facility-administered medications for this visit.   Allergies  Allergen Reactions  . Ace Inhibitors Other (See Comments)    Causes blood pressure to drop rapidly  . Cefaclor Hives and Itching  . Shrimp [Shellfish Allergy] Nausea And Vomiting  . Ceclor [Cefaclor]   . Glyxambi [Empagliflozin-Linagliptin]     Itching/rash  . Sulfa Antibiotics   . Sulfonamide Derivatives Rash     Exam:  BP 133/53 mmHg  Pulse 63  Ht 5\' 3"  (1.6 m)  Wt 263 lb (119.296 kg)  BMI 46.60 kg/m2  LMP 10/18/1995 Gen: WDWN female in no acute distress.  HEENT: EOMI,  MMM. Left TM visualized with some evidence of erythema. Right TM unable to be visualized due to  cerumen impaction. Patient denies pain on examination.  Lungs: Normal work of breathing. Diminished lung sounds in all fields.  Heart: RRR no MRG Exts: Brisk capillary refill, warm and well perfused.  Musculoskeletal: positive straight leg, bilaterally. Patellar reflexes 1+ bilaterally. ROM limited at waist due to pain. Pain over lumbar spine and paraspinous muscles bilaterally.   Assessment: 1. Bacterial vs viral sinusitis based on patient description of symptoms, duration of symptoms and physical exam findings.   2. Chronic back and leg pain due to sciatica based on previous diagnoses as well as patient description of symptoms not well controlled with current management.   Plan: 1. Patient prescribed Amoxicillin-Clavulanate 875-125 mg tablets BID for bacterial sinusitis and olopatadine HCl 0.2% solution PRN for allergic eye symptoms. Patient instructed to return to office or go to ED if symptoms worsen, she has trouble breathing or develops chest pain. Side effects of medication discussed; patient conveyed understanding and agreement to the current treatment plan.   2. X-ray and MRI ordered to investigate cause of increased pain. Patient prescribed Prednisone 10 mg tablet taper pack for inflammation and reduction of pain. Discussed possibility of pain management services in the future if current regimen does not control pain adequately to allow patient to maintain quality of life.  Norco refilled today. Patient conveyed understanding and agreement to the current treatment plan. Likely will do MRI and referral to sports med.

## 2015-08-13 ENCOUNTER — Other Ambulatory Visit: Payer: Self-pay | Admitting: Physician Assistant

## 2015-08-13 DIAGNOSIS — M545 Low back pain, unspecified: Secondary | ICD-10-CM

## 2015-08-13 DIAGNOSIS — M5440 Lumbago with sciatica, unspecified side: Principal | ICD-10-CM

## 2015-08-13 NOTE — Telephone Encounter (Signed)
Pt stated that she's been on propanolol for years and years.

## 2015-08-14 ENCOUNTER — Encounter: Payer: Self-pay | Admitting: Physician Assistant

## 2015-08-28 ENCOUNTER — Other Ambulatory Visit: Payer: Self-pay | Admitting: Physician Assistant

## 2015-08-28 ENCOUNTER — Telehealth: Payer: Self-pay | Admitting: *Deleted

## 2015-08-28 MED ORDER — INSULIN ASPART 100 UNIT/ML FLEXPEN: 4.0000 [IU] | PEN_INJECTOR | Freq: Three times a day (TID) | SUBCUTANEOUS | Status: DC

## 2015-08-28 NOTE — Telephone Encounter (Signed)
Pt left vm stating that now she is up moving more & eating more so now her sugars are running 300-400.  She wants to know if you'd send her novolog to her pharm so she can get back on that.  Please advise.

## 2015-08-28 NOTE — Telephone Encounter (Signed)
Sent!

## 2015-08-31 NOTE — Telephone Encounter (Signed)
Pt notified of rx. 

## 2015-09-02 ENCOUNTER — Other Ambulatory Visit: Payer: Self-pay | Admitting: Physician Assistant

## 2015-09-09 ENCOUNTER — Ambulatory Visit (INDEPENDENT_AMBULATORY_CARE_PROVIDER_SITE_OTHER): Payer: Medicare Other | Admitting: Physician Assistant

## 2015-09-09 VITALS — BP 138/52 | HR 69 | Temp 98.6°F | Wt 265.0 lb

## 2015-09-09 DIAGNOSIS — E1140 Type 2 diabetes mellitus with diabetic neuropathy, unspecified: Secondary | ICD-10-CM | POA: Diagnosis not present

## 2015-09-09 DIAGNOSIS — J069 Acute upper respiratory infection, unspecified: Secondary | ICD-10-CM

## 2015-09-09 DIAGNOSIS — E1149 Type 2 diabetes mellitus with other diabetic neurological complication: Secondary | ICD-10-CM | POA: Diagnosis not present

## 2015-09-09 DIAGNOSIS — E118 Type 2 diabetes mellitus with unspecified complications: Secondary | ICD-10-CM

## 2015-09-09 DIAGNOSIS — J0100 Acute maxillary sinusitis, unspecified: Secondary | ICD-10-CM | POA: Diagnosis not present

## 2015-09-09 DIAGNOSIS — Z794 Long term (current) use of insulin: Secondary | ICD-10-CM

## 2015-09-09 LAB — POCT GLYCOSYLATED HEMOGLOBIN (HGB A1C): HEMOGLOBIN A1C: 8.5

## 2015-09-09 MED ORDER — PSEUDOEPHEDRINE-CODEINE-GG 30-10-100 MG/5ML PO SOLN: 10.0000 mL | Freq: Four times a day (QID) | ORAL | Status: DC | PRN

## 2015-09-09 MED ORDER — LIRAGLUTIDE 18 MG/3ML ~~LOC~~ SOPN: 1.2000 mg | PEN_INJECTOR | Freq: Every day | SUBCUTANEOUS | Status: DC

## 2015-09-09 MED ORDER — PIOGLITAZONE HCL 30 MG PO TABS: 30.0000 mg | ORAL_TABLET | Freq: Every day | ORAL | Status: DC

## 2015-09-09 MED ORDER — DOXYCYCLINE HYCLATE 100 MG PO TABS: 100.0000 mg | ORAL_TABLET | Freq: Two times a day (BID) | ORAL | Status: DC

## 2015-09-09 MED ORDER — GLUCOSE BLOOD VI STRP: ORAL_STRIP | Status: DC

## 2015-09-09 NOTE — Progress Notes (Signed)
   Subjective:    Patient ID: [name] Rice, female    DOB: [birth year], 59 y.o.   MRN: [id number]  HPI  Pt presents to the clinic to follow up on DM and discuss 1 week of sinus pressure symptoms.   DM- taking lantus 40mg  twice a day and added back novolog at meal times. Since adding novolog sugars have been much better 120-140 in am and 140's 2 hours after meals. Denies any new symptoms. No open wounds or non-healing ulcers. Continues to have neuropathy and some gastroparesis.   She has had URI symptoms for last week and half. Progressed to more sinus pressure and headache. No fever or chills. Not tried anything. Hx of sinus infections. Complains of running nose and mild ear pain with congestion.      Review of Systems  All other systems reviewed and are negative.      Objective:   Physical Exam  Constitutional: She is oriented to person, place, and time. She appears well-developed and well-nourished.  HENT:  Head: Normocephalic and atraumatic.  Right Ear: External ear normal.  Left Ear: External ear normal.  Mouth/Throat: Oropharynx is clear and moist. No oropharyngeal exudate.  TM's clear bilaterally.  Tenderness over bilateral maxillary sinuses.  Nasal turbinates red and swollen.   Eyes: Conjunctivae are normal. Right eye exhibits no discharge. Left eye exhibits no discharge.  Neck: Normal range of motion. Neck supple.  Cardiovascular: Normal rate, regular rhythm and normal heart sounds.   Pulmonary/Chest: Effort normal and breath sounds normal.  Lymphadenopathy:    She has no cervical adenopathy.  Neurological: She is alert and oriented to person, place, and time.  Psychiatric: She has a normal mood and affect. Her behavior is normal.          Assessment & Plan:  Type II DM- .Marland Kitchen Lab Results  Component Value Date   HGBA1C 8.5 09/09/2015   8.5 up from 7.  Pt has added back in sliding scale with as needed meal time insulin. She has stopped that over last 2 months.  Since adding 1-2 weeks ago sugars much better. No changes to meds until next 3 month check.  Reminded of healthy diabetic diet.  Reminded of need for eye exam.    Acute sinusitis- doxycycline for 10 days. Mytussin DAC solution up to three times a day for cough. RESt and hydrate. Follow up as needed.

## 2015-09-14 ENCOUNTER — Telehealth: Payer: Self-pay

## 2015-09-14 MED ORDER — HYDROCODONE-ACETAMINOPHEN 5-325 MG PO TABS: ORAL_TABLET | ORAL | Status: DC

## 2015-09-14 MED ORDER — INSULIN GLARGINE 100 UNIT/ML ~~LOC~~ SOLN: 40.0000 [IU] | Freq: Two times a day (BID) | SUBCUTANEOUS | Status: DC

## 2015-09-14 NOTE — Telephone Encounter (Signed)
Did the syrup help? Given at last visit. Could give prednisone 50mg  for 5 days #5 NRF if would like.

## 2015-09-14 NOTE — Telephone Encounter (Signed)
Patient called and left a message stating she still has congestion. She would like Jade's recommendation on what she can take. Please advise.

## 2015-09-15 NOTE — Telephone Encounter (Signed)
She could get mucinex D over the counter. mucinex is one component of cough syrup.

## 2015-09-15 NOTE — Telephone Encounter (Signed)
She could not get the syrup. Insurance would not pay for cough syrup.

## 2015-09-16 NOTE — Telephone Encounter (Signed)
Patient advised.

## 2015-09-29 ENCOUNTER — Encounter: Payer: Self-pay | Admitting: *Deleted

## 2015-09-29 ENCOUNTER — Emergency Department (INDEPENDENT_AMBULATORY_CARE_PROVIDER_SITE_OTHER)
Admission: EM | Admit: 2015-09-29 | Discharge: 2015-09-29 | Disposition: A | Discharge status: Home / Self Care | Payer: Medicare Other | Source: Home / Self Care | Attending: Family Medicine | Admitting: Family Medicine

## 2015-09-29 DIAGNOSIS — N3001 Acute cystitis with hematuria: Secondary | ICD-10-CM

## 2015-09-29 DIAGNOSIS — R300 Dysuria: Secondary | ICD-10-CM | POA: Diagnosis not present

## 2015-09-29 LAB — POCT URINALYSIS DIP (MANUAL ENTRY)
BILIRUBIN UA: NEGATIVE
Bilirubin, UA: NEGATIVE
Nitrite, UA: POSITIVE — AB
Spec Grav, UA: 1.02 (ref 1.005–1.03)
Urobilinogen, UA: 0.2 (ref 0–1)
pH, UA: 5.5 (ref 5–8)

## 2015-09-29 MED ORDER — FLUCONAZOLE 150 MG PO TABS: 150.0000 mg | ORAL_TABLET | Freq: Once | ORAL | Status: DC

## 2015-09-29 MED ORDER — NITROFURANTOIN MONOHYD MACRO 100 MG PO CAPS: 100.0000 mg | ORAL_CAPSULE | Freq: Two times a day (BID) | ORAL | Status: DC

## 2015-09-29 NOTE — Discharge Instructions (Signed)
°  Please take antibiotics as prescribed and be sure to complete entire course even if you start to feel better to ensure infection does not come back. ° °

## 2015-09-29 NOTE — ED Notes (Signed)
Pt c/o blood in urine, dysuria, and urinary frequency x 4-5 days. Denies fever.

## 2015-09-29 NOTE — ED Provider Notes (Signed)
CSN: [id number]     Arrival date & time 09/29/15  1641 History   First MD Initiated Contact with Patient 09/29/15 1708     Chief Complaint  Patient presents with  . Urinary Frequency  . Hematuria   (Consider location/radiation/quality/duration/timing/severity/associated sxs/prior Treatment) HPI  Pt is a 59yo female presenting to [facility] with c/o dysuria, hematuria, and urinary frequency for 4-5 days.  Pt states symptoms of dysuria started to improve after she started taking her Lasix, however, she has developed lower abdominal discomfort and urinary frequency.  Denies fever, chills, n/v/d.  Pt states last UTI was several years ago but she also had a kidney stone at that time.  Pt does have lower back pain but this is chronic, she does not have any worsening back pain. Pt notes she was recently on 2 antibiotics- doxycycline and Augmentin, for a sinus infection and concerned she may get a yeast infection.    Past Medical History  Diagnosis Date  . Anxiety   . Arthritis   . Asthma     no PFTs  . COPD (chronic obstructive pulmonary disease) (HCC)     no PFTs  . Depression   . Diabetes mellitus   . GERD (gastroesophageal reflux disease)   . Hypertension   . HLD (hyperlipidemia)   . Dizziness   . History of colonic polyps     hyperplastic  . Lumbar pain 2011    per x-ray - Multilevel spondylosis  . Cervical dystonia   . Tremor [place]  . Torn rotator cuff RT SHOULDER  . Diarrhea   . History of kidney stones   . Recurrent boils     "SLUSTER BOILS" ON ABD AND LABIA  . History of seborrheic dermatitis     face,ears  . OSA on CPAP 2008  . Sleep apnea     USES C-PAP  . Diverticulosis   . Diverticulitis   . Pneumonia   . Morbid obesity ([place])   . Nephrolithiasis   . Gastroparesis   . Intussusception [facility])    Past Surgical History  Procedure Laterality Date  . Pilonidal cyst excision  [id number]  . Tubal ligation    . Lumbar laminectomy    . Carpal tunnel release  Left 2006  . Elbow surgery Left   . Knee arthroscopy Left   . Nasal sinus surgery    . Hand surgery Right RIGHT  . Joint replacement Right     shoulder  . Mass excision  06/28/2012    Procedure: EXCISION MASS;  Surgeon: Harl Bowie, MD;  Location: [place];  Service: General;  Laterality: Left;  excision left upper quadrant abdominal wall mass, Benign Lipoma   Family History  Problem Relation Age of Onset  . Heart disease Brother   . Heart attack Brother   . Depression Brother   . Diabetes Brother   . Hypertension Brother   . Hyperlipidemia Brother   . Cancer Neg Hx   . Diabetes Mother   . Hyperlipidemia Mother   . Hypertension Mother   . Depression Sister   . Diabetes Sister   . Hyperlipidemia Sister   . Hypertension Sister   . Stroke Sister   . Heart attack Brother   . Depression Brother   . Diabetes Brother   . Hyperlipidemia Brother   . Hypertension Brother   . Kidney disease Mother   . Kidney cancer Brother    Social History  Substance Use Topics  . Smoking status: Former  Smoker    Types: Cigarettes    Quit date: 10/17/1996  . Smokeless tobacco: Never Used  . Alcohol Use: No   OB History    No data available     Review of Systems  Constitutional: Negative for fever and chills.  Respiratory: Negative for cough and shortness of breath.   Cardiovascular: Negative for chest pain and palpitations.  Gastrointestinal: Positive for abdominal pain. Negative for nausea, vomiting and diarrhea.  Genitourinary: Positive for dysuria, urgency, frequency and hematuria. Negative for flank pain and decreased urine volume.  Musculoskeletal: Positive for back pain ( chronic). Negative for myalgias and arthralgias.  Skin: Negative for rash.    Allergies  Ace inhibitors; Cefaclor; Shrimp; Ceclor; Glyxambi; Sulfa antibiotics; and Sulfonamide derivatives  Home Medications   Prior to Admission medications   Medication Sig Start Date End Date Taking? Authorizing Provider   albuterol (PROAIR HFA) 108 (90 BASE) MCG/ACT inhaler Inhale 2 puffs into the lungs every 6 (six) hours as needed for wheezing or shortness of breath. 09/18/13   Jade L Breeback, PA-C  AMBULATORY NON FORMULARY MEDICATION Diabetic shoes  Dx: Diabetes, uncontrolled 250.60 with neuropathy. 05/27/14   Jade L Breeback, PA-C  AMBULATORY NON FORMULARY MEDICATION Test strips and needles for one touch Verio IQ glucometer.  Use to test blood sugar up to four times a day. Diagnosis Type 2 Diabetes 03/17/15   Marcial Pacas, DO  B-D INS SYR ULTRAFINE 1CC/30G 30G X 1/2" 1 ML MISC use as directed three times a day 07/06/15   Donella Stade, PA-C  doxycycline (VIBRA-TABS) 100 MG tablet Take 1 tablet (100 mg total) by mouth 2 (two) times daily. 09/09/15   Jade L Breeback, PA-C  DULoxetine (CYMBALTA) 20 MG capsule Take 1 capsule (20 mg total) by mouth daily. PATIENT NEEDS APPOINTMENT FOR FURTHER REFILLS 09/02/15   Donella Stade, PA-C  DULoxetine (CYMBALTA) 60 MG capsule take 1 capsule by mouth once daily (TAKE WITH 20MG  TO EQUAL 80MG ) 06/01/15   Jade L Breeback, PA-C  fluconazole (DIFLUCAN) 150 MG tablet Take 1 tablet (150 mg total) by mouth once. May repeat second dose in 3 days 09/29/15   Noland Fordyce, PA-C  Fluticasone-Salmeterol (ADVAIR) 250-50 MCG/DOSE AEPB Inhale 1 puff into the lungs 2 (two) times daily. 06/17/15   Jade L Breeback, PA-C  furosemide (LASIX) 40 MG tablet take 1 tablet by mouth once daily 11/18/14   Jade L Breeback, PA-C  gabapentin (NEURONTIN) 800 MG tablet Take 1 tablet (800 mg total) by mouth 3 (three) times daily. Patient taking differently: Take 800 mg by mouth 2 (two) times daily.  03/17/15   Sean Hommel, DO  glucose blood (ONETOUCH VERIO) test strip Check blood sugar 3 times daily. Diagnosis: Type II diabetes mellitus with neurological manifestations (E11.49) 09/09/15   Donella Stade, PA-C  HYDROcodone-acetaminophen (NORCO/VICODIN) 5-325 MG tablet TAKE 1 TO 2 TABLETS BY MOUTH TWICE DAILY  09/14/15   Jade L Breeback, PA-C  insulin aspart (NOVOLOG) 100 UNIT/ML FlexPen Inject 4 Units into the skin 3 (three) times daily with meals. Please include pen needles. 08/28/15   Jade L Breeback, PA-C  insulin glargine (LANTUS) 100 UNIT/ML injection Inject 0.4 mLs (40 Units total) into the skin 2 (two) times daily. 09/14/15   Jade L Breeback, PA-C  Insulin Pen Needle (NOVOFINE) 32G X 6 MM MISC Inject once daily [place] 06/12/14   Jade L Breeback, PA-C  Liraglutide (VICTOZA) 18 MG/3ML SOPN Inject 0.2 mLs (1.2 mg total) into the skin daily.  09/09/15   Jade L Breeback, PA-C  meclizine (ANTIVERT) 25 MG tablet Take 1 tablet (25 mg total) by mouth 3 (three) times daily as needed for dizziness. 12/30/14   Jade L Breeback, PA-C  metFORMIN (GLUCOPHAGE) 1000 MG tablet take 1 tablet by mouth twice a day with food 05/25/15   Donella Stade, PA-C  methocarbamol (ROBAXIN) 500 MG tablet take 1 tablet by mouth three times a day 07/10/15   Donella Stade, PA-C  montelukast (SINGULAIR) 10 MG tablet take 1 tablet by mouth at bedtime 11/26/14   Jade L Breeback, PA-C  neomycin-polymyxin-hydrocortisone (CORTISPORIN) otic solution Place 3 drops into both ears 3 (three) times daily as needed (for psoriasis in ear, only as needed).    Historical Provider, MD  nitrofurantoin, macrocrystal-monohydrate, (MACROBID) 100 MG capsule Take 1 capsule (100 mg total) by mouth 2 (two) times daily. For 5 days 09/29/15   Noland Fordyce, PA-C  Nystatin [facility]) 100000 UNIT/GM POWD apply topically three times a day 08/12/15   Donella Stade, PA-C  nystatin ointment (MYCOSTATIN) apply topically twice a day 08/12/15   Donella Stade, PA-C  Olopatadine HCl 0.2 % SOLN Apply 1 drop to eye 2 (two) times daily as needed. 08/12/15   Jade L Breeback, PA-C  ondansetron (ZOFRAN) 4 MG tablet Take 1 tablet (4 mg total) by mouth every 4 (four) hours as needed for nausea or vomiting. 12/18/14   Irene Shipper, MD  pantoprazole (PROTONIX) 40 MG tablet Take 1 tablet  (40 mg total) by mouth daily. 10/14/14   Jade L Breeback, PA-C  pioglitazone (ACTOS) 30 MG tablet Take 1 tablet (30 mg total) by mouth daily. 09/09/15   Jade L Breeback, PA-C  pravastatin (PRAVACHOL) 40 MG tablet take 1 tablet by mouth once daily 09/02/15   Jade L Breeback, PA-C  propranolol (INDERAL) 20 MG tablet take 1 tablet by mouth twice a day 07/10/15   Donella Stade, PA-C  triamcinolone cream (KENALOG) 0.1 % Apply 1 application topically 2 (two) times daily. Patient taking differently: Apply 1 application topically as needed.  11/22/13   Donella Stade, PA-C   Meds Ordered and Administered this Visit  Medications - No data to display  BP 124/71 mmHg  Pulse 80  Temp(Src) 98.3 F (36.8 C) (Oral)  Resp 18  Ht 5\' 3"  (1.6 m)  Wt 268 lb (121.564 kg)  BMI 47.49 kg/m2  SpO2 99%  LMP 10/18/1995 No data found.   Physical Exam  Constitutional: She appears well-developed and well-nourished. No distress.  HENT:  Head: Normocephalic and atraumatic.  Eyes: Conjunctivae are normal. No scleral icterus.  Neck: Normal range of motion.  Cardiovascular: Normal rate, regular rhythm and normal heart sounds.   Pulmonary/Chest: Effort normal and breath sounds normal. No respiratory distress. She has no wheezes. She has no rales. She exhibits no tenderness.  Abdominal: Soft. She exhibits no distension and no mass. There is no tenderness. There is no rebound and no guarding.  Musculoskeletal: Normal range of motion.  Neurological: She is alert.  Skin: Skin is warm and dry. She is not diaphoretic.  Nursing note and vitals reviewed.   ED Course  Procedures (including critical care time)  Labs Review Labs Reviewed  POCT URINALYSIS DIP (MANUAL ENTRY) - Abnormal; Notable for the following:    Clarity, UA cloudy (*)    Glucose, UA =100 (*)    Blood, UA small (*)    Protein Ur, POC =30 (*)    Nitrite,  UA Positive (*)    Leukocytes, UA small (1+) (*)    All other components within normal limits   URINE CULTURE    Imaging Review No results found.    MDM   1. Dysuria   2. Acute cystitis with hematuria    Pt c/o urinary frequency, hematuria, and dysuria for 4-5 days. Hx of UTIs several years ago. Pt appears well, non-toxic, NAD.   UA: c/w UTI  Rx: macrobid and Diflucan as pt was on recent antibiotics for sinusitis and concerned she will get a yeast infection.  Advised to f/u with PCP in 1 week if not improving, sooner if worsening. Patient verbalized understanding and agreement with treatment plan.     Noland Fordyce, PA-C 09/29/15 [phone]

## 2015-10-02 ENCOUNTER — Telehealth: Payer: Self-pay

## 2015-10-02 LAB — URINE CULTURE

## 2015-10-08 ENCOUNTER — Other Ambulatory Visit: Payer: Self-pay | Admitting: Physician Assistant

## 2015-10-13 ENCOUNTER — Other Ambulatory Visit: Payer: Self-pay

## 2015-10-13 MED ORDER — HYDROCODONE-ACETAMINOPHEN 5-325 MG PO TABS: ORAL_TABLET | ORAL | Status: DC

## 2015-10-13 NOTE — Telephone Encounter (Signed)
Patient called requesting hydrocodone refill.  Rx printed.  Patient aware it will be ready for pick up 10/14/2015.

## 2015-10-23 ENCOUNTER — Other Ambulatory Visit: Payer: Self-pay

## 2015-10-23 DIAGNOSIS — J4520 Mild intermittent asthma, uncomplicated: Secondary | ICD-10-CM

## 2015-10-23 MED ORDER — ALBUTEROL SULFATE HFA 108 (90 BASE) MCG/ACT IN AERS: 2.0000 | INHALATION_SPRAY | Freq: Four times a day (QID) | RESPIRATORY_TRACT | Status: DC | PRN

## 2015-11-10 ENCOUNTER — Other Ambulatory Visit: Payer: Self-pay

## 2015-11-10 MED ORDER — HYDROCODONE-ACETAMINOPHEN 5-325 MG PO TABS: ORAL_TABLET | ORAL | Status: DC

## 2015-11-13 DIAGNOSIS — E119 Type 2 diabetes mellitus without complications: Secondary | ICD-10-CM | POA: Diagnosis not present

## 2015-11-13 DIAGNOSIS — H25813 Combined forms of age-related cataract, bilateral: Secondary | ICD-10-CM | POA: Diagnosis not present

## 2015-11-13 DIAGNOSIS — H5213 Myopia, bilateral: Secondary | ICD-10-CM | POA: Diagnosis not present

## 2015-11-13 DIAGNOSIS — H524 Presbyopia: Secondary | ICD-10-CM | POA: Diagnosis not present

## 2015-11-13 DIAGNOSIS — Z135 Encounter for screening for eye and ear disorders: Secondary | ICD-10-CM | POA: Diagnosis not present

## 2015-11-13 DIAGNOSIS — H3561 Retinal hemorrhage, right eye: Secondary | ICD-10-CM | POA: Diagnosis not present

## 2015-11-18 LAB — HM DIABETES EYE EXAM

## 2015-11-21 ENCOUNTER — Other Ambulatory Visit: Payer: Self-pay | Admitting: Physician Assistant

## 2015-11-21 LAB — HM DIABETES EYE EXAM

## 2015-11-23 ENCOUNTER — Other Ambulatory Visit: Payer: Self-pay | Admitting: Physician Assistant

## 2015-11-23 DIAGNOSIS — E113291 Type 2 diabetes mellitus with mild nonproliferative diabetic retinopathy without macular edema, right eye: Secondary | ICD-10-CM

## 2015-11-24 ENCOUNTER — Other Ambulatory Visit: Payer: Self-pay | Admitting: Physician Assistant

## 2015-11-25 ENCOUNTER — Encounter: Payer: Self-pay | Admitting: Physician Assistant

## 2015-12-14 ENCOUNTER — Other Ambulatory Visit: Payer: Self-pay | Admitting: Physician Assistant

## 2015-12-14 MED ORDER — HYDROCODONE-ACETAMINOPHEN 5-325 MG PO TABS: ORAL_TABLET | ORAL | Status: DC

## 2015-12-15 ENCOUNTER — Encounter: Payer: Self-pay | Admitting: Physician Assistant

## 2015-12-15 ENCOUNTER — Other Ambulatory Visit: Payer: Self-pay | Admitting: Physician Assistant

## 2015-12-15 ENCOUNTER — Ambulatory Visit: Payer: Self-pay | Admitting: Physician Assistant

## 2015-12-15 ENCOUNTER — Ambulatory Visit (INDEPENDENT_AMBULATORY_CARE_PROVIDER_SITE_OTHER): Payer: Medicare Other | Admitting: Physician Assistant

## 2015-12-15 VITALS — BP 138/89 | HR 72 | Ht 63.0 in | Wt 268.0 lb

## 2015-12-15 DIAGNOSIS — F418 Other specified anxiety disorders: Secondary | ICD-10-CM

## 2015-12-15 DIAGNOSIS — E1142 Type 2 diabetes mellitus with diabetic polyneuropathy: Secondary | ICD-10-CM | POA: Diagnosis not present

## 2015-12-15 DIAGNOSIS — E118 Type 2 diabetes mellitus with unspecified complications: Secondary | ICD-10-CM | POA: Diagnosis not present

## 2015-12-15 DIAGNOSIS — M5440 Lumbago with sciatica, unspecified side: Secondary | ICD-10-CM

## 2015-12-15 DIAGNOSIS — Z79899 Other long term (current) drug therapy: Secondary | ICD-10-CM | POA: Diagnosis not present

## 2015-12-15 DIAGNOSIS — M545 Low back pain, unspecified: Secondary | ICD-10-CM

## 2015-12-15 DIAGNOSIS — G894 Chronic pain syndrome: Secondary | ICD-10-CM | POA: Insufficient documentation

## 2015-12-15 LAB — POCT UA - MICROALBUMIN
CREATININE, POC: 200 mg/dL
MICROALBUMIN (UR) POC: 30 mg/L

## 2015-12-15 LAB — POCT GLYCOSYLATED HEMOGLOBIN (HGB A1C): HEMOGLOBIN A1C: 7.5

## 2015-12-15 MED ORDER — INSULIN GLARGINE 100 UNIT/ML ~~LOC~~ SOLN: 40.0000 [IU] | Freq: Every day | SUBCUTANEOUS | Status: DC

## 2015-12-15 MED ORDER — HYDROCODONE-ACETAMINOPHEN 5-325 MG PO TABS: ORAL_TABLET | ORAL | Status: DC

## 2015-12-15 MED ORDER — DULOXETINE HCL 30 MG PO CPEP: ORAL_CAPSULE | ORAL | Status: DC

## 2015-12-15 MED ORDER — CETIRIZINE HCL 10 MG PO TABS: 10.0000 mg | ORAL_TABLET | Freq: Every day | ORAL | Status: DC

## 2015-12-15 NOTE — Addendum Note (Signed)
Addended by: Donella Stade on: 12/15/2015 04:48 PM   Modules accepted: Orders, Medications

## 2015-12-15 NOTE — Patient Instructions (Addendum)
Increase victoza to 1.8mg  daily.  Increase cymbalta to 120mg  daily.  Follow up with Dr. Lawana Chambers.

## 2015-12-15 NOTE — Progress Notes (Addendum)
   Subjective:    Patient ID: [name] Rice, female    DOB: 1956-07-31, 60 y.o.   MRN: [id number]  HPI  Patient is a 60 year old female that presents for diabetes follow up. Patient states that she has reduced her Lantus from 70 units BID to 40 units once a day. Patient states that she has been forgetting to check her blood glucose regularly but when she has over past month has been 120-160 at bedtime and 80 in am. She is taking metformin, lanuts, actos, victoza 1.2. She is not taking novolog.  Additional concerns for this visit include addressing allergies/vertigo, increasing depression and chronic pain. Patient states that she has been experiencing rhinorrhea and vertigo. Patient states that she has taken Montelukast, which has not relieved her symptoms. Patient states that she has been increasingly depressed over her social situation which requires that she reside with her daughter, grandson and grandson's girlfriend.  Patient was interested in adding tramadol to her pharmacotherapy regimen for chronic back pain. Long hx of low back pain with surgery in 2001 by Dr. Lawana Chambers in [place]. She currently takes norco bid as needed but still has pain and numbness in extremities. She also has neurontin but usually only takes 1 time a day. She finds herself sleeping most of the day away. Patient denies suicidal ideation or homicidal ideation. Patient feels hopeless.   Review of Systems Please see HPI     Objective:   Physical Exam  Constitutional: She is oriented to person, place, and time. She appears well-developed and well-nourished.  HENT:  Head: Normocephalic and atraumatic.  Nose: Nose normal.  Mouth/Throat: Oropharynx is clear and moist.  Eyes: Conjunctivae and EOM are normal. Pupils are equal, round, and reactive to light.  Neck: Normal range of motion.  Cardiovascular: Normal rate, regular rhythm, normal heart sounds and intact distal pulses.   Pulmonary/Chest: Effort normal and breath sounds  normal.  Abdominal: Soft.  Musculoskeletal:  Patient has pain with ambulation.   Neurological: She is alert and oriented to person, place, and time.  Skin: Skin is warm and dry.  Psychiatric: She has a normal mood and affect. Her behavior is normal. Judgment and thought content normal.      Assessment & Plan:   1. Allergic Rhinitis  Patient has non-purulent rhinorrhea and symptoms consistent with vertigo. Patient was prescribed Zyrtec.   2. Chronic Pain/low back pain Patient will continue to be treated with Norco for chronic back pain. Patient was advised to make an appointment with orthopedic surgery to reevaluate after surgery. We could also consider epidural injections. If no improvement then consider pain management. On norco and gabapentin and cymbalta. UDS ordered today.   3. Depression  Patient's Cymbalta was increased to 120 mg daily.   4. Diabetes  Patient's Hemoglobin A1C today was 7.5. Patient states that she reduced Lantus from 70 units BID to 40 units bid. Patient's Lantus was reduced to 40 units at bedtime. Novolog was discontinued at this time. Patient will continue taking Victoza. Victoza was increased to 1.8 mg daily. Continue on ACTOS and metformin.  Patient underwent foot exam today with findings consistent with peripheral neuropathy. Patient's urine microalbumin was conducted today and was within reference range.

## 2015-12-16 LAB — DRUG SCREEN, URINE
AMPHETAMINE SCRN UR: NEGATIVE
BENZODIAZEPINES.: NEGATIVE
Barbiturate Quant, Ur: NEGATIVE
COCAINE METABOLITES: NEGATIVE
CREATININE, U: 104.72 mg/dL
MARIJUANA METABOLITE: NEGATIVE
Methadone: NEGATIVE
Opiates: POSITIVE — AB
Phencyclidine (PCP): NEGATIVE
Propoxyphene: NEGATIVE

## 2015-12-30 ENCOUNTER — Emergency Department (INDEPENDENT_AMBULATORY_CARE_PROVIDER_SITE_OTHER)
Admission: EM | Admit: 2015-12-30 | Discharge: 2015-12-30 | Disposition: A | Discharge status: Home / Self Care | Payer: Medicare Other | Source: Home / Self Care | Attending: Family Medicine | Admitting: Family Medicine

## 2015-12-30 ENCOUNTER — Encounter: Payer: Self-pay | Admitting: *Deleted

## 2015-12-30 DIAGNOSIS — L409 Psoriasis, unspecified: Secondary | ICD-10-CM

## 2015-12-30 DIAGNOSIS — R519 Headache, unspecified: Secondary | ICD-10-CM

## 2015-12-30 DIAGNOSIS — R51 Headache: Secondary | ICD-10-CM

## 2015-12-30 DIAGNOSIS — R0981 Nasal congestion: Secondary | ICD-10-CM | POA: Diagnosis not present

## 2015-12-30 DIAGNOSIS — J029 Acute pharyngitis, unspecified: Secondary | ICD-10-CM

## 2015-12-30 MED ORDER — AZITHROMYCIN 250 MG PO TABS: 250.0000 mg | ORAL_TABLET | Freq: Every day | ORAL | Status: DC

## 2015-12-30 MED ORDER — PREDNISONE 20 MG PO TABS: ORAL_TABLET | ORAL | Status: DC

## 2015-12-30 NOTE — Discharge Instructions (Signed)

## 2015-12-30 NOTE — ED Provider Notes (Signed)
CSN: [id number]     Arrival date & time 12/30/15  1912 History   First MD Initiated Contact with Patient 12/30/15 1918     Chief Complaint  Patient presents with  . Dizziness  . Headache  . Rash   (Consider location/radiation/quality/duration/timing/severity/associated sxs/prior Treatment) HPI  Sinuses: The pt is a 60yo female presenting to [facility] with c/o sinus headaches, pressure and dizziness that has been going on for about 5-6 days. Symptoms started with nasal congestion and rhinorrhea.  She was advised to take OTC antihistamines to help but after taking the medication for 2-3 days she became very dizzy and had to stop taking the medication.  Dizziness has nearly resolved since stopping that antihistamine.  Congestion has resolved but her sinus headaches are still present.  Pain is worse in the back of her head.  Pain is aching and sore.    Psoriasis: She also reports having an outbreak of her psoriasis on her Right thumb that has caused her skin to become so try that it has cracked so much it does not want to heal. She has been using her prescribed triamcinolone cream but no relief. Pain is mild.  Rash has also broken out around her race.   Past Medical History  Diagnosis Date  . Anxiety   . Arthritis   . Asthma     no PFTs  . COPD (chronic obstructive pulmonary disease) (HCC)     no PFTs  . Depression   . Diabetes mellitus   . GERD (gastroesophageal reflux disease)   . Hypertension   . HLD (hyperlipidemia)   . Dizziness   . History of colonic polyps     hyperplastic  . Lumbar pain 2011    per x-ray - Multilevel spondylosis  . Cervical dystonia   . Tremor [place]  . Torn rotator cuff RT SHOULDER  . Diarrhea   . History of kidney stones   . Recurrent boils     "SLUSTER BOILS" ON ABD AND LABIA  . History of seborrheic dermatitis     face,ears  . OSA on CPAP 2008  . Sleep apnea     USES C-PAP  . Diverticulosis   . Diverticulitis   . Pneumonia   . Morbid  obesity ([place])   . Nephrolithiasis   . Gastroparesis   . Intussusception [facility])    Past Surgical History  Procedure Laterality Date  . Pilonidal cyst excision  [id number]  . Tubal ligation    . Lumbar laminectomy    . Carpal tunnel release Left 2006  . Elbow surgery Left   . Knee arthroscopy Left   . Nasal sinus surgery    . Hand surgery Right RIGHT  . Joint replacement Right     shoulder  . Mass excision  06/28/2012    Procedure: EXCISION MASS;  Surgeon: Harl Bowie, MD;  Location: [place];  Service: General;  Laterality: Left;  excision left upper quadrant abdominal wall mass, Benign Lipoma   Family History  Problem Relation Age of Onset  . Heart disease Brother   . Heart attack Brother   . Depression Brother   . Diabetes Brother   . Hypertension Brother   . Hyperlipidemia Brother   . Cancer Neg Hx   . Diabetes Mother   . Hyperlipidemia Mother   . Hypertension Mother   . Depression Sister   . Diabetes Sister   . Hyperlipidemia Sister   . Hypertension Sister   . Stroke Sister   .  Heart attack Brother   . Depression Brother   . Diabetes Brother   . Hyperlipidemia Brother   . Hypertension Brother   . Kidney disease Mother   . Kidney cancer Brother    Social History  Substance Use Topics  . Smoking status: Former Smoker    Types: Cigarettes    Quit date: 10/17/1996  . Smokeless tobacco: Never Used  . Alcohol Use: No   OB History    No data available     Review of Systems  Constitutional: Positive for fever and chills.  HENT: Positive for congestion, postnasal drip, rhinorrhea and sinus pressure. Negative for ear pain, sneezing, sore throat, trouble swallowing and voice change.   Respiratory: Negative for cough and shortness of breath.   Cardiovascular: Negative for chest pain and palpitations.  Gastrointestinal: Negative for nausea, vomiting, abdominal pain and diarrhea.  Musculoskeletal: Negative for myalgias, back pain and arthralgias.  Skin: Negative  for rash.  Neurological: Positive for dizziness and headaches. Negative for syncope and light-headedness.    Allergies  Ace inhibitors; Cefaclor; Shrimp; Ceclor; Glyxambi; Sulfa antibiotics; and Sulfonamide derivatives  Home Medications   Prior to Admission medications   Medication Sig Start Date End Date Taking? Authorizing Provider  albuterol (PROAIR HFA) 108 (90 Base) MCG/ACT inhaler Inhale 2 puffs into the lungs every 6 (six) hours as needed for wheezing or shortness of breath. 10/23/15   Jade L Breeback, PA-C  AMBULATORY NON FORMULARY MEDICATION Diabetic shoes  Dx: Diabetes, uncontrolled 250.60 with neuropathy. 05/27/14   Jade L Breeback, PA-C  AMBULATORY NON FORMULARY MEDICATION Test strips and needles for one touch Verio IQ glucometer.  Use to test blood sugar up to four times a day. Diagnosis Type 2 Diabetes 03/17/15   Marcial Pacas, DO  azithromycin (ZITHROMAX) 250 MG tablet Take 1 tablet (250 mg total) by mouth daily. Take first 2 tablets together, then 1 every day until finished. 12/30/15   Noland Fordyce, PA-C  B-D INS SYR ULTRAFINE 1CC/30G 30G X 1/2" 1 ML MISC use as directed three times a day 07/06/15   Donella Stade, PA-C  cetirizine (ZYRTEC) 10 MG tablet Take 1 tablet (10 mg total) by mouth daily. 12/15/15   Jade L Breeback, PA-C  DULoxetine (CYMBALTA) 30 MG capsule Take 4  tablets daily for depression. 12/15/15   Jade L Breeback, PA-C  Fluticasone-Salmeterol (ADVAIR) 250-50 MCG/DOSE AEPB Inhale 1 puff into the lungs 2 (two) times daily. 06/17/15   Jade L Breeback, PA-C  furosemide (LASIX) 40 MG tablet take 1 tablet by mouth once daily 11/18/14   Jade L Breeback, PA-C  gabapentin (NEURONTIN) 800 MG tablet Take 1 tablet (800 mg total) by mouth 3 (three) times daily. Patient taking differently: Take 800 mg by mouth 2 (two) times daily.  03/17/15   Sean Hommel, DO  glucose blood (ONETOUCH VERIO) test strip Check blood sugar 3 times daily. Diagnosis: Type II diabetes mellitus with neurological  manifestations (E11.49) 09/09/15   Jade L Breeback, PA-C  HYDROcodone-acetaminophen (NORCO/VICODIN) 5-325 MG tablet TAKE 1 TO 2 TABLETS BY MOUTH TWICE DAILY. 12/15/15   Jade L Breeback, PA-C  insulin glargine (LANTUS) 100 UNIT/ML injection Inject 0.4 mLs (40 Units total) into the skin at bedtime. 12/15/15   Jade L Breeback, PA-C  Insulin Pen Needle (NOVOFINE) 32G X 6 MM MISC Inject once daily  06/12/14   Jade L Breeback, PA-C  Liraglutide (VICTOZA) 18 MG/3ML SOPN Inject 0.2 mLs (1.2 mg total) into the skin daily. 09/09/15  Jade L Breeback, PA-C  meclizine (ANTIVERT) 25 MG tablet Take 1 tablet (25 mg total) by mouth 3 (three) times daily as needed for dizziness. 12/30/14   Jade L Breeback, PA-C  metFORMIN (GLUCOPHAGE) 1000 MG tablet take 1 tablet by mouth twice a day with food 10/08/15   Donella Stade, PA-C  methocarbamol (ROBAXIN) 500 MG tablet take 1 tablet by mouth three times a day 07/10/15   Donella Stade, PA-C  montelukast (SINGULAIR) 10 MG tablet take 1 tablet by mouth at bedtime 10/08/15   Jade L Breeback, PA-C  neomycin-polymyxin-hydrocortisone (CORTISPORIN) otic solution Place 3 drops into both ears 3 (three) times daily as needed (for psoriasis in ear, only as needed).    Historical Provider, MD  Nystatin [facility]) 100000 UNIT/GM POWD apply topically to affected area three times a day 11/25/15   Donella Stade, PA-C  nystatin ointment (MYCOSTATIN) apply topically twice a day 08/12/15   Jade L Breeback, PA-C  Olopatadine HCl 0.2 % SOLN Apply 1 drop to eye 2 (two) times daily as needed. 08/12/15   Jade L Breeback, PA-C  ondansetron (ZOFRAN) 4 MG tablet Take 1 tablet (4 mg total) by mouth every 4 (four) hours as needed for nausea or vomiting. 12/18/14   Irene Shipper, MD  pantoprazole (PROTONIX) 40 MG tablet Take 1 tablet (40 mg total) by mouth daily. 10/14/14   Jade L Breeback, PA-C  pioglitazone (ACTOS) 30 MG tablet Take 1 tablet (30 mg total) by mouth daily. 09/09/15   Donella Stade, PA-C   pravastatin (PRAVACHOL) 40 MG tablet take 1 tablet by mouth once daily 09/02/15   Jade L Breeback, PA-C  predniSONE (DELTASONE) 20 MG tablet 2 tabs po daily x 3 days 12/30/15   Noland Fordyce, PA-C  propranolol (INDERAL) 20 MG tablet take 1 tablet by mouth twice a day 12/15/15   Donella Stade, PA-C  triamcinolone cream (KENALOG) 0.1 % Apply 1 application topically 2 (two) times daily. Patient taking differently: Apply 1 application topically as needed.  11/22/13   Donella Stade, PA-C   Meds Ordered and Administered this Visit  Medications - No data to display  BP 138/79 mmHg  Pulse 92  Temp(Src) 98.2 F (36.8 C) (Oral)  Resp 18  Ht 5\' 3"  (1.6 m)  Wt 268 lb (121.564 kg)  BMI 47.49 kg/m2  SpO2 100%  LMP 10/18/1995 No data found.   Physical Exam  Constitutional: She appears well-developed and well-nourished. No distress.  HENT:  Head: Normocephalic and atraumatic.  Right Ear: Tympanic membrane normal.  Left Ear: Tympanic membrane normal.  Nose: Mucosal edema present. Right sinus exhibits maxillary sinus tenderness and frontal sinus tenderness. Left sinus exhibits no maxillary sinus tenderness and no frontal sinus tenderness.  Mouth/Throat: Uvula is midline and oropharynx is clear and moist.  Eyes: Conjunctivae are normal. No scleral icterus.  Neck: Normal range of motion. Neck supple.  Cardiovascular: Normal rate, regular rhythm and normal heart sounds.   Pulmonary/Chest: Effort normal and breath sounds normal. No stridor. No respiratory distress. She has no wheezes. She has no rales.  Abdominal: Soft. She exhibits no distension. There is no tenderness.  Musculoskeletal: Normal range of motion.  Lymphadenopathy:    She has no cervical adenopathy.  Neurological: She is alert.  Skin: Skin is warm and dry. Rash noted. She is not diaphoretic. There is erythema.  Right hand: 1cm superficial laceration at base of thumb, palmar aspect. No active bleeding. Diffuse dried  skin.  Sporadic erythematous dried rash on face.   Nursing note and vitals reviewed.   ED Course  Procedures (including critical care time)  Labs Review Labs Reviewed - No data to display  Imaging Review No results found.    MDM   1. Sinus headache   2. Sinus congestion   3. Psoriasis    Pt c/o sinus pain and pressure for [redacted] week along with psoriasis rash on face and Right hand.    Encouraged symptomatic treatment of sinus headaches, however, pt is on chronic pain medication that is not helping. Will treat for bacterial cause.    Rx: azithromycin and prednisone. Pt has taken oral prednisone in the past and has done okay with it.    Wound on Right hand from psoriasis, cleaned. Bacitracin ointment and mepilex bandage applied.  Advised she may keep bandage on for 2-3 days and then replace.    Encouraged f/u with PCP next week if not improving. Patient verbalized understanding and agreement with treatment plan.   Noland Fordyce, PA-C 12/30/15 1954

## 2015-12-30 NOTE — ED Notes (Signed)
Pt c/o dizziness with sinus HA and sinus pain x 5-6 days. Dizziness is better today.  She also c/o Psoriasis on her RT thumb x 5 days.

## 2016-01-01 ENCOUNTER — Telehealth: Payer: Self-pay

## 2016-01-01 DIAGNOSIS — E1142 Type 2 diabetes mellitus with diabetic polyneuropathy: Secondary | ICD-10-CM

## 2016-01-01 MED ORDER — DULOXETINE HCL 60 MG PO CPEP: ORAL_CAPSULE | ORAL | Status: DC

## 2016-01-01 NOTE — Telephone Encounter (Signed)
Ok to send 60mg .

## 2016-01-01 NOTE — Telephone Encounter (Signed)
Sent!

## 2016-01-12 ENCOUNTER — Other Ambulatory Visit: Payer: Self-pay

## 2016-01-12 MED ORDER — HYDROCODONE-ACETAMINOPHEN 5-325 MG PO TABS: ORAL_TABLET | ORAL | Status: DC

## 2016-01-19 ENCOUNTER — Telehealth: Payer: Self-pay

## 2016-01-19 NOTE — Telephone Encounter (Signed)
Ok to refill 

## 2016-02-15 ENCOUNTER — Other Ambulatory Visit: Payer: Self-pay

## 2016-02-15 MED ORDER — HYDROCODONE-ACETAMINOPHEN 5-325 MG PO TABS: ORAL_TABLET | ORAL | Status: DC

## 2016-02-16 ENCOUNTER — Encounter: Payer: Self-pay | Admitting: Physician Assistant

## 2016-02-16 ENCOUNTER — Ambulatory Visit (INDEPENDENT_AMBULATORY_CARE_PROVIDER_SITE_OTHER): Payer: Medicare Other | Admitting: Physician Assistant

## 2016-02-16 VITALS — BP 166/54 | HR 75 | Ht 63.0 in | Wt 262.0 lb

## 2016-02-16 DIAGNOSIS — M25511 Pain in right shoulder: Secondary | ICD-10-CM

## 2016-02-16 DIAGNOSIS — R2689 Other abnormalities of gait and mobility: Secondary | ICD-10-CM | POA: Diagnosis not present

## 2016-02-16 DIAGNOSIS — E1142 Type 2 diabetes mellitus with diabetic polyneuropathy: Secondary | ICD-10-CM | POA: Diagnosis not present

## 2016-02-16 MED ORDER — MELOXICAM 15 MG PO TABS: 15.0000 mg | ORAL_TABLET | Freq: Every day | ORAL | Status: DC

## 2016-02-16 NOTE — Patient Instructions (Signed)
mobic as needed once a day with food and watch out for any increasing stomach pains.  Order physical therapy for unsteady balance and right shoulder pain.

## 2016-02-16 NOTE — Addendum Note (Signed)
Addended by: Donella Stade on: 02/16/2016 11:20 AM   Modules accepted: Orders

## 2016-02-16 NOTE — Progress Notes (Addendum)
   Subjective:    Patient ID: [name] Rice, female    DOB: October 27, 1955, 60 y.o.   MRN: [id number]  HPI Patient is here today complaining increased of dizziness and unsteadiness for the last month. She does have a history of vertigo that flared 4 weeks ago but has since resolved. She has recently started using her walker again to help with her balance. She has not started any new medications. She denies visual changes, lightheadedness, or feeling as if she is going to faint. She states that the dizziness is more related to feeling "uneasy" rather than true dizzy spells.   She fell 02/03/16 in the grass and landed on the right side of her body. She did not lose consciousness and did not seek medical attention at that time. She is complaining of right shoulder and right knee pain as well as some neck pain. She denies decreased range of motion in these areas, just states that she is sore. Ice and heat do help relieve pain. She is not taking any NSAIDs.    Review of Systems  All other systems reviewed and are negative.      Objective:   Physical Exam  Constitutional: She is oriented to person, place, and time. She appears well-developed and well-nourished.  Morbidly Obese  HENT:  Head: Normocephalic and atraumatic.  Cardiovascular: Normal rate, regular rhythm and normal heart sounds.   Pulmonary/Chest: Effort normal and breath sounds normal.  Musculoskeletal:       Right shoulder: She exhibits decreased range of motion, tenderness and bony tenderness. She exhibits no swelling, no effusion, no crepitus and no laceration.       Right knee: She exhibits normal range of motion, no swelling, no effusion, no deformity, no laceration, no erythema and normal alignment.  Neurological: She is alert and oriented to person, place, and time.  Skin: Skin is warm and dry.  Diffuse ecchymosis along the right shin.   Psychiatric: She has a normal mood and affect. Her behavior is normal. Thought content normal.           Assessment & Plan:  1. Right shoulder pain- Patient states she has had right shoulder tenderness since falling 02/03/16. She does have a history of bursitis in this shoulder. On PE, she was very tender to palpation of the right shoulder and have pain with movement of the shoulder. She does not want to have imaging done at this time. I don't suspect a fracture at this time as well. I do think there is a lot of inflammation. I am sending Mobic for pain and recommend that she continue using ice/heat as needed for symptomatic relief. Discussed increased risk of ulcers with mobic. Take with food. Will get PT to help work on ROM.   2. Unstable balance/dM neuropathy- Patient reports noticing increasing unsteadiness over the last 4 weeks. She admits to not being very active during the day. I advised patient to continue using her walker to prevent falls. I have placed the order for patient to receive physical therapy to help with strength and balance. Discussed how DM neuropathy is affecting her balance. Always wear good supportive shoes. Continue on gabapentin. Discussed med list and meds that could effect balance.

## 2016-02-22 ENCOUNTER — Ambulatory Visit (INDEPENDENT_AMBULATORY_CARE_PROVIDER_SITE_OTHER): Payer: Medicare Other | Admitting: Physical Therapy

## 2016-02-22 ENCOUNTER — Other Ambulatory Visit: Payer: Self-pay | Admitting: Physician Assistant

## 2016-02-22 DIAGNOSIS — R262 Difficulty in walking, not elsewhere classified: Secondary | ICD-10-CM

## 2016-02-22 DIAGNOSIS — M545 Low back pain, unspecified: Secondary | ICD-10-CM

## 2016-02-22 DIAGNOSIS — M25671 Stiffness of right ankle, not elsewhere classified: Secondary | ICD-10-CM | POA: Diagnosis not present

## 2016-02-22 DIAGNOSIS — M25511 Pain in right shoulder: Secondary | ICD-10-CM | POA: Diagnosis not present

## 2016-02-22 DIAGNOSIS — M6281 Muscle weakness (generalized): Secondary | ICD-10-CM | POA: Diagnosis not present

## 2016-02-22 DIAGNOSIS — M25672 Stiffness of left ankle, not elsewhere classified: Secondary | ICD-10-CM

## 2016-02-22 NOTE — Patient Instructions (Addendum)
Gastroc Stretch    [place] [id number]    Stand with right foot back, leg straight, forward leg bent. Keeping heel on floor, turned slightly out, lean into wall until stretch is felt in calf. Hold _30-45___ seconds. Repeat __1__ times per set. Do __1__ sets per session. Do __1__ sessions per day.   Strengthening: Knee Flexion (Standing) - do next to a counter    With support, bend right knee as far as possible. Repeat __10__ times per set. Do _1-3___ sets per session. Do __1__ sessions per day.  Strengthening: Hip Abduction - Resisted, do next to a counter, no band.     With tubing around right leg, other side toward anchor, extend leg out from side. Repeat __10__ times per set. Do __1-3__ sets per session. Do __1__ sessions per day.  Strengthening: Hip Extension - Resisted, hold on to a counter, no band    With tubing around right ankle, face anchor and pull leg straight back. Repeat _10___ times per set. Do __1-3__ sets per session. Do __1__ sessions per day.  Strengthening: Hip Flexion - Resisted - no band, hold on to a counter    With tubing around left ankle, anchor behind, bring leg forward, keeping knee straight. Repeat _10__ times per set. Do __1-3__ sets per session. Do __1__ sessions per day.     Functional Quadriceps: Sit to Stand use hands as needed, having legs do as much work as they can.     Sit on edge of chair, feet flat on floor. Stand upright, extending knees fully. Repeat __5__ times per set. Do _1-5___ sets per session. Do __1__ sessions per day.    Wrist Elbow Flexion: Resisted - Palm Up    With right arm straight, palm forward, holding _1-3___ pound weight, bend elbow. Return slowly. Or use a soup/vegy can Repeat __until you get tired__ times per set. Do ____ sets per session. Do _1___ sessions per day.  Progressive Resisted: Flexion (Standing)    Holding _1-3___ pound weights, raise arms toward ceiling. Keep elbows straight. Or a  soup/vegy can Repeat __until tired__ times per set. Do ____ sets per session. Do __1__ sessions per day.  Resisted External Rotation: in Neutral - Bilateral    Sit or stand, tubing in both hands, elbows at sides, bent to 90, forearms forward. Pinch shoulder blades together and rotate forearms out. Keep elbows at sides. Repeat _until tired___ times per set. Do __1__ sets per session. Do ___1_ sessions per day.  Copyright  VHI. All rights reserved.

## 2016-02-22 NOTE — Therapy (Addendum)
[facility] [place], [place], [address] Phone: [phone]   Fax:  [phone]  Physical Therapy Evaluation  Patient Details  Name: [name] Rice MRN: [id number] Date of Birth: 1956-08-18 Referring Provider: Donnelly Stager  Encounter Date: 02/22/2016      PT End of Session - 02/22/16 1434    Visit Number 1   Number of Visits 4   Date for PT Re-Evaluation 04/18/16   PT Start Time 9163   PT Stop Time 1519   PT Time Calculation (min) 45 min   Activity Tolerance Patient limited by fatigue      Past Medical History  Diagnosis Date  . Anxiety   . Arthritis   . Asthma     no PFTs  . COPD (chronic obstructive pulmonary disease) (HCC)     no PFTs  . Depression   . Diabetes mellitus   . GERD (gastroesophageal reflux disease)   . Hypertension   . HLD (hyperlipidemia)   . Dizziness   . History of colonic polyps     hyperplastic  . Lumbar pain 2011    per x-ray - Multilevel spondylosis  . Cervical dystonia   . Tremor [place]  . Torn rotator cuff RT SHOULDER  . Diarrhea   . History of kidney stones   . Recurrent boils     "SLUSTER BOILS" ON ABD AND LABIA  . History of seborrheic dermatitis     face,ears  . OSA on CPAP 2008  . Sleep apnea     USES C-PAP  . Diverticulosis   . Diverticulitis   . Pneumonia   . Morbid obesity ([place])   . Nephrolithiasis   . Gastroparesis   . Intussusception [facility])     Past Surgical History  Procedure Laterality Date  . Pilonidal cyst excision  8466,5993  . Tubal ligation    . Lumbar laminectomy    . Carpal tunnel release Left 2006  . Elbow surgery Left   . Knee arthroscopy Left   . Nasal sinus surgery    . Hand surgery Right RIGHT  . Joint replacement Right     shoulder  . Mass excision  06/28/2012    Procedure: EXCISION MASS;  Surgeon: Harl Bowie, MD;  Location: [facility];  Service: General;  Laterality: Left;  excision left upper quadrant  abdominal wall mass, Benign Lipoma    There were no vitals filed for this visit.       Subjective Assessment - 02/22/16 1434    Subjective Patient reports she noticed issues with her balance, she fell in April landing on her Rt side and now she has pain inthe shoulder and some into her neck along with balance disorders.  She has started using her RW  full time out of her house and is using it more at home . She has limited room in her new apartment and would really like to get off it.    Pertinent History Rt shoulder bursitis a couple years ago, s/p surgery , has BPPV and had to have repositioned twice over the last many years. , DM, multiple orthopedic surgeries, sciatica uses ice daily.    Patient Stated Goals pt wishes to strengthen her muscles so she can pick up more items and not be as wobbly.    Currently in Pain? Yes   Pain Score 2    Pain Location Shoulder   Pain Orientation Right   Pain Descriptors / Indicators Sore  Pain Type Acute pain   Pain Onset More than a month ago   Aggravating Factors  lying on her Rt side   Pain Relieving Factors getting off her side            [facility] PT Assessment - 02/22/16 0001    Assessment   Medical Diagnosis Unstable balance & Rt shoulder pain    Referring Provider Donnelly Stager   Onset Date/Surgical Date 02/03/16   Hand Dominance Right   Next MD Visit 03/15/16   Prior Therapy not this time and has had tx for BPPV   Precautions   Precautions None   Precaution Comments has RW, raised commode    Balance Screen   Has the patient fallen in the past 6 months Yes   How many times? 1  causing her Rt shoulder pain   Has the patient had a decrease in activity level because of a fear of falling?  No   Is the patient reluctant to leave their home because of a fear of falling?  No   Home Environment   Living Environment --  apartment with daughter   Prior Function   Level of Independence Independent  uses adaptive equipment   Vocation  Retired   Leisure play games on computer   Observation/Other Assessments   Focus on Therapeutic Outcomes (FOTO)  41% limited   Posture/Postural Control   Posture/Postural Control Postural limitations   Postural Limitations Rounded Shoulders;Forward head;Increased thoracic kyphosis  extra abdominal girth.    ROM / Strength   AROM / PROM / Strength AROM;Strength   AROM   AROM Assessment Site Cervical;Shoulder;Hip;Ankle;Knee   Right/Left Shoulder --  bilat shoulders equal on both sides, limited in all motions.   Right/Left Hip --  hip ext Rt 2 degrees, Lt 30   Right/Left Knee Left;Right   Right Knee Flexion 120   Left Knee Flexion 120   Right/Left Ankle Left;Right   Right Ankle Dorsiflexion -3   Left Ankle Dorsiflexion 0   Cervical Flexion 15   Cervical Extension 24   Cervical - Right Rotation 43   Cervical - Left Rotation 40   Strength   Strength Assessment Site Shoulder;Elbow;Hip;Knee   Right/Left Shoulder --  Lt shoulder grossly 4+/5, Rt 4/5 except ER 4-/5   Right/Left Elbow --  biceps 4+/5, triceps 4/5   Right/Left Hip Right;Left   Right Hip Flexion 3+/5   Right Hip ABduction 3/5   Left Hip Flexion 4-/5   Left Hip ABduction 4/5   Right/Left Knee Left;Right   Right Knee Flexion 4-/5   Right Knee Extension 4-/5   Left Knee Flexion 4+/5   Left Knee Extension 5/5                   [facility] Adult PT Treatment/Exercise - 02/22/16 0001    Exercises   Exercises Shoulder;Knee/Hip   Knee/Hip Exercises: Stretches   Gastroc Stretch Both;30 seconds   Knee/Hip Exercises: Standing   Knee Flexion Both;10 reps  all LE at counter top   Hip Abduction Both;10 reps   Hip Extension Both;10 reps   Other Standing Knee Exercises sit to/from stand   Shoulder Exercises: Seated   External Rotation Both;12 reps;Theraband   Theraband Level (Shoulder External Rotation) Level 2 (Red)                PT Education - 02/22/16 1513    Education provided Yes   Education  Details HEP   Person(s) Educated Patient  Methods Explanation;Demonstration;Handout   Comprehension Returned demonstration;Verbalized understanding             PT Long Term Goals - 02/22/16 1542    PT LONG TERM GOAL #1   Title I with advanced HEP ( 04/18/16)   Time 8   Period Weeks   Status New   PT LONG TERM GOAL #2   Title increase bilat hip strength =/> 4+/5 to assist with gait ( 04/18/16)    Time 8   Period Weeks   Status New   PT LONG TERM GOAL #3   Title increase bilat ankle dorsiflexion =/> 10 degree to allow ankle strategies to occur in standing/balance ( 04/18/16)    Time 8   Period Weeks   Status New   PT LONG TERM GOAL #4   Title report Rt shoulder pain decrease =/> 75% with dressing ( 04/18/16)    Time 8   Period Weeks   Status New   PT LONG TERM GOAL #5   Title increase Rt shoulder  strength =/> 4+/5 to assist with lifting things at home ( 04/18/16)    Time 8   Period Weeks   Status New   Additional Long Term Goals   Additional Long Term Goals Yes   PT LONG TERM GOAL #6   Title improve FOTO =/< 35% limited ( 04/18/16)    Time 8   Period Weeks   Status New   PT LONG TERM GOAL #7   Title demo bilat hip extension =/> 25 degrees to assist with gait ( 04/18/16)    Time 8   Period Weeks   Status New               Plan - 02/22/16 1655    Clinical Impression Statement [name] Rice has a complicated medical history and multiple orthopedic issues.  She is very weak overall and has tightness in her hips, ankles and shoulders.  She has limited availabilty to attend treatment and will have to rely on performing her HEP.  Progres may be limited due to this.    Rehab Potential Good   PT Frequency Other (comment)  every other week per patient request   PT Duration 8 weeks   PT Treatment/Interventions Neuromuscular re-education;Balance training;Patient/family education;Gait training;Cryotherapy;Electrical Stimulation;Moist Heat;Therapeutic exercise;Manual  techniques;Functional mobility training   PT Next Visit Plan progress HEP, stretch ankles and hip   Consulted and Agree with Plan of Care Patient      Patient will benefit from skilled therapeutic intervention in order to improve the following deficits and impairments:  Postural dysfunction, Decreased strength, Decreased mobility, Decreased balance, Pain, Decreased activity tolerance, Decreased endurance, Obesity, Difficulty walking, Decreased range of motion, Abnormal gait  Visit Diagnosis: Muscle weakness (generalized) - Plan: PT plan of care cert/re-cert  Pain in right shoulder - Plan: PT plan of care cert/re-cert  Bilateral low back pain without sciatica - Plan: PT plan of care cert/re-cert  Difficulty in walking, not elsewhere classified - Plan: PT plan of care cert/re-cert  Stiffness of left ankle, not elsewhere classified - Plan: PT plan of care cert/re-cert  Stiffness of right ankle, not elsewhere classified - Plan: PT plan of care cert/re-cert     Problem List Patient Active Problem List   Diagnosis Date Noted  . Morbid obesity due to excess calories ([place]) 02/16/2016  . Right shoulder pain 02/16/2016  . Unstable balance 02/16/2016  . Chronic pain syndrome 12/15/2015  . Non-proliferative diabetic retinopathy, right eye ([place]) 11/23/2015  .  Diarrhea 01/12/2015  . Ear pain 12/09/2014  . COPD (chronic obstructive pulmonary disease) with chronic bronchitis ([place]) 12/09/2014  . Type 2 diabetes mellitus with complication ([place]) 11/64/3539  . Gastroparesis due to secondary diabetes ([place]) 09/16/2014  . Diabetic peripheral neuropathy ([place]) 05/30/2014  . Diverticulitis 11/27/2013  . Hepatic steatosis 09/26/2013  . DOE (dyspnea on exertion) 03/31/2013  . Intestinal bacterial overgrowth 09/20/2012  . Lipoma of abdominal wall 05/01/2012  . Iron deficiency anemia 03/29/2012  . History of allergic rhinitis 09/07/2011  . Preventative health care 08/16/2011  . Insomnia 02/06/2011   . OSA on CPAP   . BPPV (benign paroxysmal positional vertigo) 07/14/2009  . Depression with anxiety 01/28/2008  . Low back pain with radiation 11/27/2007  . Type II or unspecified type diabetes mellitus with neurological manifestations, not stated as uncontrolled 08/08/2006  . DYSLIPIDEMIA 08/08/2006  . Essential hypertension 08/08/2006  . COPD 08/08/2006  . GERD 08/08/2006    Jeral Pinch PT 02/22/2016, 4:59 PM  [facility] [place], [place], [address] Phone: [phone]   Fax:  [phone]  Name: [name] Rice MRN: [id number] Date of Birth: 1956-02-08   PHYSICAL THERAPY DISCHARGE SUMMARY  Visits from Start of Care: 1  Current functional level related to goals / functional outcomes: unknown   Remaining deficits: unknown   Education / Equipment: Initial HEP  Plan:                                                    Patient goals were not met. Patient is being discharged due to not returning since the last visit.  ?????    Jeral Pinch, PT 04/25/2016 6:53 AM

## 2016-02-24 ENCOUNTER — Other Ambulatory Visit: Payer: Self-pay | Admitting: *Deleted

## 2016-02-24 DIAGNOSIS — E1140 Type 2 diabetes mellitus with diabetic neuropathy, unspecified: Secondary | ICD-10-CM

## 2016-02-24 DIAGNOSIS — Z794 Long term (current) use of insulin: Principal | ICD-10-CM

## 2016-02-24 MED ORDER — GABAPENTIN 800 MG PO TABS: 800.0000 mg | ORAL_TABLET | Freq: Three times a day (TID) | ORAL | Status: DC

## 2016-03-07 ENCOUNTER — Other Ambulatory Visit: Payer: Self-pay | Admitting: Physician Assistant

## 2016-03-09 ENCOUNTER — Encounter: Payer: Medicare Other | Admitting: Physical Therapy

## 2016-03-15 ENCOUNTER — Ambulatory Visit (INDEPENDENT_AMBULATORY_CARE_PROVIDER_SITE_OTHER): Payer: Medicare Other | Admitting: Physician Assistant

## 2016-03-15 ENCOUNTER — Encounter: Payer: Self-pay | Admitting: Physician Assistant

## 2016-03-15 ENCOUNTER — Other Ambulatory Visit (HOSPITAL_COMMUNITY)
Admission: RE | Admit: 2016-03-15 | Discharge: 2016-03-15 | Disposition: A | Discharge status: Home / Self Care | Payer: Medicare Other | Source: Ambulatory Visit | Attending: Physician Assistant | Admitting: Physician Assistant

## 2016-03-15 ENCOUNTER — Ambulatory Visit: Payer: Self-pay | Admitting: Physician Assistant

## 2016-03-15 VITALS — BP 138/41 | HR 66 | Ht 63.0 in | Wt 269.0 lb

## 2016-03-15 DIAGNOSIS — Z794 Long term (current) use of insulin: Secondary | ICD-10-CM

## 2016-03-15 DIAGNOSIS — E118 Type 2 diabetes mellitus with unspecified complications: Secondary | ICD-10-CM | POA: Diagnosis not present

## 2016-03-15 DIAGNOSIS — M25562 Pain in left knee: Secondary | ICD-10-CM | POA: Diagnosis not present

## 2016-03-15 DIAGNOSIS — W19XXXA Unspecified fall, initial encounter: Secondary | ICD-10-CM

## 2016-03-15 DIAGNOSIS — E1142 Type 2 diabetes mellitus with diabetic polyneuropathy: Secondary | ICD-10-CM

## 2016-03-15 DIAGNOSIS — E113291 Type 2 diabetes mellitus with mild nonproliferative diabetic retinopathy without macular edema, right eye: Secondary | ICD-10-CM

## 2016-03-15 DIAGNOSIS — Z01419 Encounter for gynecological examination (general) (routine) without abnormal findings: Secondary | ICD-10-CM

## 2016-03-15 DIAGNOSIS — Z780 Asymptomatic menopausal state: Secondary | ICD-10-CM

## 2016-03-15 DIAGNOSIS — Z1151 Encounter for screening for human papillomavirus (HPV): Secondary | ICD-10-CM | POA: Insufficient documentation

## 2016-03-15 DIAGNOSIS — E1343 Other specified diabetes mellitus with diabetic autonomic (poly)neuropathy: Secondary | ICD-10-CM

## 2016-03-15 DIAGNOSIS — L304 Erythema intertrigo: Secondary | ICD-10-CM | POA: Insufficient documentation

## 2016-03-15 LAB — POCT GLYCOSYLATED HEMOGLOBIN (HGB A1C): HEMOGLOBIN A1C: 6.9

## 2016-03-15 MED ORDER — PIOGLITAZONE HCL 30 MG PO TABS: 30.0000 mg | ORAL_TABLET | Freq: Every day | ORAL | Status: DC

## 2016-03-15 MED ORDER — HYDROCODONE-ACETAMINOPHEN 5-325 MG PO TABS: ORAL_TABLET | ORAL | Status: DC

## 2016-03-15 MED ORDER — LIRAGLUTIDE 18 MG/3ML ~~LOC~~ SOPN: 1.2000 mg | PEN_INJECTOR | Freq: Every day | SUBCUTANEOUS | Status: DC

## 2016-03-15 MED ORDER — NYSTATIN 100000 UNIT/GM EX POWD: CUTANEOUS | Status: DC

## 2016-03-15 MED ORDER — DULOXETINE HCL 60 MG PO CPEP: ORAL_CAPSULE | ORAL | Status: DC

## 2016-03-15 NOTE — Progress Notes (Signed)
   Subjective:    Patient ID: [name] Rice, female    DOB: 1956/10/05, 60 y.o.   MRN: [id number]  HPI Pt is a 60 yo morbidly obese female who presents to the clinic for pap smear and DM follow up.   She denies any problems or irregular bleeding. She is post-menopausal. No abnormal paps. She does have recurrent yeast infections of groin. She uses nystatin and needs refill.   Patient is checking her sugars and they seem to be running in the 100-160 in the mornings. She is feeling a lot better. She is working on her diet and taking her medications regularly. She does not seem to be losing any weight. She does have chronic neuropathy and taking gabapentin for this. She also does take some daily Norco to help with pain. She did fall last night. There was some water in the floor and she slipped on it. She fell on her left knee. It is painful today.   Review of Systems  All other systems reviewed and are negative.      Objective:   Physical Exam  Constitutional: She is oriented to person, place, and time. She appears well-developed and well-nourished.  Morbidly obese.   HENT:  Head: Normocephalic and atraumatic.  Right Ear: External ear normal.  Cardiovascular: Normal rate, regular rhythm and normal heart sounds.   Pulmonary/Chest: Effort normal and breath sounds normal.  Abdominal: Soft. Bowel sounds are normal. She exhibits no distension and no mass. There is no tenderness. There is no rebound and no guarding.  Genitourinary:  No adnexal tenderness.  Body habitus makes difficult to feel any masses.  Neurological: She is alert and oriented to person, place, and time.  Skin: Skin is dry.  Psychiatric: She has a normal mood and affect. Her behavior is normal.          Assessment & Plan:  DM type II- a1c is 6.9 down from 7.5. Great job. Keep up the good work with diet and taking medication regularly.  Pneumonia vaccines up to date.  Continue lantus, vicotza, metformin, actos.  Only  uses novolog via sliding scale if needed.  Follow up in 3 months.   Diabetic neuropathy- refilled neurontin. norco refilled.   Preventative- Pap done today. Pending normal pap no more paps needed.   Intertrigo- nystatin powder refilled.   Fall/left knee pain- pt declined imaging. Discussed ice, elevation, NSAIDS. Follow up as needed.

## 2016-03-16 DIAGNOSIS — Z780 Asymptomatic menopausal state: Secondary | ICD-10-CM | POA: Insufficient documentation

## 2016-03-16 DIAGNOSIS — W19XXXA Unspecified fall, initial encounter: Secondary | ICD-10-CM | POA: Insufficient documentation

## 2016-03-17 LAB — CYTOLOGY - PAP

## 2016-03-28 ENCOUNTER — Ambulatory Visit (INDEPENDENT_AMBULATORY_CARE_PROVIDER_SITE_OTHER): Payer: Medicare Other | Admitting: Physician Assistant

## 2016-03-28 ENCOUNTER — Encounter: Payer: Self-pay | Admitting: Physician Assistant

## 2016-03-28 ENCOUNTER — Ambulatory Visit (INDEPENDENT_AMBULATORY_CARE_PROVIDER_SITE_OTHER): Payer: Medicare Other

## 2016-03-28 VITALS — BP 165/47 | HR 101 | Ht 63.0 in | Wt 261.0 lb

## 2016-03-28 DIAGNOSIS — S79911A Unspecified injury of right hip, initial encounter: Secondary | ICD-10-CM | POA: Diagnosis not present

## 2016-03-28 DIAGNOSIS — F430 Acute stress reaction: Secondary | ICD-10-CM | POA: Diagnosis not present

## 2016-03-28 DIAGNOSIS — J069 Acute upper respiratory infection, unspecified: Secondary | ICD-10-CM | POA: Diagnosis not present

## 2016-03-28 DIAGNOSIS — M25559 Pain in unspecified hip: Secondary | ICD-10-CM | POA: Diagnosis not present

## 2016-03-28 DIAGNOSIS — M25551 Pain in right hip: Secondary | ICD-10-CM

## 2016-03-28 LAB — CBC WITH DIFFERENTIAL/PLATELET
Basophils Absolute: 0 cells/uL (ref 0–200)
Basophils Relative: 0 %
Eosinophils Absolute: 252 cells/uL (ref 15–500)
Eosinophils Relative: 3 %
HCT: 36.5 % (ref 35.0–45.0)
Hemoglobin: 11.1 g/dL — ABNORMAL LOW (ref 11.7–15.5)
Lymphocytes Relative: 22 %
Lymphs Abs: 1848 cells/uL (ref 850–3900)
MCH: 23.8 pg — ABNORMAL LOW (ref 27.0–33.0)
MCHC: 30.4 g/dL — ABNORMAL LOW (ref 32.0–36.0)
MCV: 78.2 fL — ABNORMAL LOW (ref 80.0–100.0)
MPV: 9.5 fL (ref 7.5–12.5)
Monocytes Absolute: 588 cells/uL (ref 200–950)
Monocytes Relative: 7 %
NEUTROS PCT: 68 %
Neutro Abs: 5712 cells/uL (ref 1500–7800)
Platelets: 282 10*3/uL (ref 140–400)
RBC: 4.67 MIL/uL (ref 3.80–5.10)
RDW: 17.2 % — AB (ref 11.0–15.0)
WBC: 8.4 10*3/uL (ref 3.8–10.8)

## 2016-03-28 MED ORDER — LORAZEPAM 0.5 MG PO TABS: 0.5000 mg | ORAL_TABLET | Freq: Three times a day (TID) | ORAL | Status: DC | PRN

## 2016-03-28 MED ORDER — AZELASTINE HCL 0.1 % NA SOLN: 2.0000 | Freq: Two times a day (BID) | NASAL | Status: DC

## 2016-03-28 NOTE — Progress Notes (Addendum)
   Subjective:    Patient ID: [name] Rice, female    DOB: 10-01-1956, 60 y.o.   MRN: [id number]  HPI  Patient presents to the clinic for multiple concerns.   Her whole family who lives together in a small apartment have been sick for the last 2-3 weeks. Grandson and his girlfriend have been tested for meningitis and mono which were negative. They were told it was viral. Patient has had sinus pressure, nasal rhinorhea, and general fatigue. No fever, chills, body aches, n/v/d.   She has also been recently really stressed and upset. Her grandson and girlfriend just moved back in. They all 4 live in a 2 bedroom apartment. They bring 2 dogs which pee and poop on the floor and bark. The girlfriend has bipolar and is in and out of the hospital. She feels very overwhelmed. She would like something to help her with anxiety and stress.   Her right hip has also been bothering her. Last weekend when took her son her ER could not make it all the way in building without having to get a wheelchair. No new injury. Radiates down the back of her right leg. Taking norco and mobic with some relief. Right leg has not given out on her.    Review of Systems  All other systems reviewed and are negative.      Objective:   Physical Exam  Constitutional: She is oriented to person, place, and time. She appears well-developed and well-nourished.  HENT:  Head: Normocephalic and atraumatic.  Right Ear: External ear normal.  Left Ear: External ear normal.  Mouth/Throat: Oropharynx is clear and moist. No oropharyngeal exudate.  Nares bilateral turbinates red and swollen.   Eyes: Conjunctivae are normal. Right eye exhibits no discharge. Left eye exhibits no discharge.  Neck: Normal range of motion. Neck supple. No thyromegaly present.  Cardiovascular: Normal rate, regular rhythm and normal heart sounds.   Pulmonary/Chest: Effort normal and breath sounds normal. She has no wheezes.  Musculoskeletal:  No tenderness  over greater trochanter of right hip.  Tenderness over pirformis muscle of right side.   Lymphadenopathy:    She has no cervical adenopathy.  Neurological: She is alert and oriented to person, place, and time.  Psychiatric: She has a normal mood and affect. Her behavior is normal.          Assessment & Plan:  Viral URI- discussed symptomatic care. I also recommended they get radon and carbon dioxide checked since whole family is sick.   Acute stress reaction- on cymbalta. Given ativan for as needed usage. Discussed sedation risk and increased fall potential. Pt aware of abuse potential. 30 tablets given if finding needing every day then need to consider adding buspar for anxiety. Follow up in 1 month.   Right hip pain- will get xray done today. Has norco and mobic. Pt is very overweight. She has been walking more recently. Discussed physical therapy. I feel like more inflammation of piriformis muscle.  Pt declined today. Will consider sports med referral in the future. Exercises given.

## 2016-04-13 ENCOUNTER — Other Ambulatory Visit: Payer: Self-pay | Admitting: Physician Assistant

## 2016-04-13 MED ORDER — HYDROCODONE-ACETAMINOPHEN 5-325 MG PO TABS: ORAL_TABLET | ORAL | Status: DC

## 2016-04-17 ENCOUNTER — Other Ambulatory Visit: Payer: Self-pay | Admitting: Physician Assistant

## 2016-05-08 ENCOUNTER — Other Ambulatory Visit: Payer: Self-pay | Admitting: Physician Assistant

## 2016-05-17 ENCOUNTER — Telehealth: Payer: Self-pay

## 2016-05-17 MED ORDER — METRONIDAZOLE 250 MG PO TABS: 250.0000 mg | ORAL_TABLET | Freq: Three times a day (TID) | ORAL | 0 refills | Status: DC

## 2016-05-17 NOTE — Telephone Encounter (Signed)
Refilled Flagyl per Dr. Blanch Media most recent note but patient does need to come in for further refills

## 2016-05-20 ENCOUNTER — Other Ambulatory Visit: Payer: Self-pay

## 2016-05-20 MED ORDER — HYDROCODONE-ACETAMINOPHEN 5-325 MG PO TABS: ORAL_TABLET | ORAL | 0 refills | Status: DC

## 2016-06-09 ENCOUNTER — Emergency Department (INDEPENDENT_AMBULATORY_CARE_PROVIDER_SITE_OTHER)
Admission: EM | Admit: 2016-06-09 | Discharge: 2016-06-09 | Disposition: A | Discharge status: Home / Self Care | Payer: Medicare Other | Source: Home / Self Care | Attending: Family Medicine | Admitting: Family Medicine

## 2016-06-09 ENCOUNTER — Encounter: Payer: Self-pay | Admitting: Emergency Medicine

## 2016-06-09 DIAGNOSIS — H9203 Otalgia, bilateral: Secondary | ICD-10-CM

## 2016-06-09 DIAGNOSIS — J029 Acute pharyngitis, unspecified: Secondary | ICD-10-CM | POA: Diagnosis not present

## 2016-06-09 LAB — POCT RAPID STREP A (OFFICE): Rapid Strep A Screen: NEGATIVE

## 2016-06-09 MED ORDER — AMOXICILLIN 875 MG PO TABS: 875.0000 mg | ORAL_TABLET | Freq: Two times a day (BID) | ORAL | 0 refills | Status: DC

## 2016-06-09 MED ORDER — PREDNISONE 20 MG PO TABS: ORAL_TABLET | ORAL | 0 refills | Status: DC

## 2016-06-09 NOTE — ED Provider Notes (Signed)
[name] Rice CARE    CSN: [id number] Arrival date & time: 06/09/16  1752  First Provider Contact:  First MD Initiated Contact with Patient 06/09/16 1816        History   Chief Complaint Chief Complaint  Patient presents with  . Sinus Problem  . Vaginitis    HPI [name] Rice is a 60 y.o. female.   Patient has a history of past sinus surgery and recurring sinusitis.  During the past two weeks she has had increased sinus congestion that has responded to Mucinex PE. She reports that 4 days ago she became fatigued and developed low grade fever to 99 with chills.  She developed mild diarrhea which is typical when she has a flare-up of gastroparesis, and she started taking Flagyl that she has on hand.  Her GI symptoms have almost resolved. Today her ears felt clogged, her cervical nodes became sore, and this afternoon she has developed mild sore throat.  No cough. She reports that she has recurring intertrigo for which she uses nystatin ointment..  She has had a pruritic spot on her introitus and wonders if she can use her nystatin ointment there also (no vaginal discharge).   The history is provided by the patient.    Past Medical History:  Diagnosis Date  . Anxiety   . Arthritis   . Asthma    no PFTs  . Cervical dystonia   . COPD (chronic obstructive pulmonary disease) (HCC)    no PFTs  . Depression   . Diabetes mellitus   . Diarrhea   . Diverticulitis   . Diverticulosis   . Dizziness   . Gastroparesis   . GERD (gastroesophageal reflux disease)   . History of colonic polyps    hyperplastic  . History of kidney stones   . History of seborrheic dermatitis    face,ears  . HLD (hyperlipidemia)   . Hypertension   . Intussusception ([place])   . Lumbar pain 2011   per x-ray - Multilevel spondylosis  . Morbid obesity ([place])   . Nephrolithiasis   . OSA on CPAP 2008  . Pneumonia   . Recurrent boils    "SLUSTER BOILS" ON ABD AND LABIA  . Sleep apnea    USES C-PAP    . Torn rotator cuff RT SHOULDER  . Tremor CCENTRAL NERVOUS SYSTEM    Patient Active Problem List   Diagnosis Date Noted  . Acute reaction to stress 03/28/2016  . Post-menopausal 03/16/2016  . Fall 03/16/2016  . Intertrigo 03/15/2016  . Left knee pain 03/15/2016  . Morbid obesity due to excess calories ([place]) 02/16/2016  . Right shoulder pain 02/16/2016  . Unstable balance 02/16/2016  . Chronic pain syndrome 12/15/2015  . Non-proliferative diabetic retinopathy, right eye ([place]) 11/23/2015  . Diarrhea 01/12/2015  . Ear pain 12/09/2014  . COPD (chronic obstructive pulmonary disease) with chronic bronchitis ([place]) 12/09/2014  . Type 2 diabetes mellitus with complication ([place]) [id number]  . Gastroparesis due to secondary diabetes ([place]) 09/16/2014  . Diabetic peripheral neuropathy ([place]) 05/30/2014  . Diverticulitis 11/27/2013  . Hepatic steatosis 09/26/2013  . DOE (dyspnea on exertion) 03/31/2013  . Intestinal bacterial overgrowth 09/20/2012  . Lipoma of abdominal wall 05/01/2012  . Iron deficiency anemia 03/29/2012  . History of allergic rhinitis 09/07/2011  . Preventative health care 08/16/2011  . Insomnia 02/06/2011  . OSA on CPAP   . BPPV (benign paroxysmal positional vertigo) 07/14/2009  . Depression with anxiety 01/28/2008  . Low back pain  with radiation 11/27/2007  . Type II or unspecified type diabetes mellitus with neurological manifestations, not stated as uncontrolled 08/08/2006  . DYSLIPIDEMIA 08/08/2006  . Essential hypertension 08/08/2006  . COPD 08/08/2006  . GERD 08/08/2006    Past Surgical History:  Procedure Laterality Date  . CARPAL TUNNEL RELEASE Left 2006  . ELBOW SURGERY Left   . HAND SURGERY Right RIGHT  . JOINT REPLACEMENT Right    shoulder  . KNEE ARTHROSCOPY Left   . LUMBAR LAMINECTOMY    . MASS EXCISION  06/28/2012   Procedure: EXCISION MASS;  Surgeon: Harl Bowie, MD;  Location: [facility];  Service: General;  Laterality: Left;  excision left  upper quadrant abdominal wall mass, Benign Lipoma  . NASAL SINUS SURGERY    . PILONIDAL CYST EXCISION  [id number]  . TUBAL LIGATION      OB History    No data available       Home Medications    Prior to Admission medications   Medication Sig Start Date End Date Taking? Authorizing Provider  albuterol (PROAIR HFA) 108 (90 Base) MCG/ACT inhaler Inhale 2 puffs into the lungs every 6 (six) hours as needed for wheezing or shortness of breath. 10/23/15   Jade L Breeback, PA-C  AMBULATORY NON FORMULARY MEDICATION Diabetic shoes  Dx: Diabetes, uncontrolled 250.60 with neuropathy. 05/27/14   Jade L Breeback, PA-C  AMBULATORY NON FORMULARY MEDICATION Test strips and needles for one touch Verio IQ glucometer.  Use to test blood sugar up to four times a day. Diagnosis Type 2 Diabetes 03/17/15   Marcial Pacas, DO  amoxicillin (AMOXIL) 875 MG tablet Take 1 tablet (875 mg total) by mouth 2 (two) times daily. (Rx void after 06/17/16) 06/09/16   Kandra Nicolas, MD  azelastine (ASTELIN) 0.1 % nasal spray Place 2 sprays into both nostrils 2 (two) times daily. Use in each nostril as directed 03/28/16   Jade L Breeback, PA-C  B-D INS SYR ULTRAFINE 1CC/30G 30G X 1/2" 1 ML MISC use as directed three times a day 07/06/15   Donella Stade, PA-C  DULoxetine (CYMBALTA) 60 MG capsule Take 2  tablets daily for depression. 03/15/16   Jade L Breeback, PA-C  Fluticasone-Salmeterol (ADVAIR) 250-50 MCG/DOSE AEPB Inhale 1 puff into the lungs 2 (two) times daily. 06/17/15   Jade L Breeback, PA-C  furosemide (LASIX) 40 MG tablet take 1 tablet by mouth once daily 02/23/16   Jade L Breeback, PA-C  gabapentin (NEURONTIN) 800 MG tablet Take 1 tablet (800 mg total) by mouth 3 (three) times daily. 02/24/16   Jade L Breeback, PA-C  glucose blood (ONETOUCH VERIO) test strip Check blood sugar 3 times daily. Diagnosis: Type II diabetes mellitus with neurological manifestations (E11.49) 09/09/15   Jade L Breeback, PA-C  HYDROcodone-acetaminophen  (NORCO/VICODIN) 5-325 MG tablet TAKE 1 TO 2 TABLETS BY MOUTH TWICE DAILY. 05/20/16   Jade L Breeback, PA-C  insulin aspart (NOVOLOG) 100 UNIT/ML injection Inject into the skin 3 (three) times daily before meals.    Historical Provider, MD  insulin glargine (LANTUS) 100 UNIT/ML injection Inject 0.4 mLs (40 Units total) into the skin at bedtime. 12/15/15   Jade L Breeback, PA-C  Insulin Pen Needle (NOVOFINE) 32G X 6 MM MISC Inject once daily [place] 06/12/14   Jade L Breeback, PA-C  Liraglutide (VICTOZA) 18 MG/3ML SOPN Inject 0.2 mLs (1.2 mg total) into the skin daily. 03/15/16   Jade L Breeback, PA-C  LORazepam (ATIVAN) 0.5 MG tablet Take 1 tablet (  0.5 mg total) by mouth every 8 (eight) hours as needed for anxiety. 03/28/16   Jade L Breeback, PA-C  meclizine (ANTIVERT) 25 MG tablet Take 1 tablet (25 mg total) by mouth 3 (three) times daily as needed for dizziness. 12/30/14   Jade L Breeback, PA-C  meloxicam (MOBIC) 15 MG tablet Take 1 tablet (15 mg total) by mouth daily. 02/16/16   Jade L Breeback, PA-C  metFORMIN (GLUCOPHAGE) 1000 MG tablet take 1 tablet by mouth twice a day with food 02/23/16   Donella Stade, PA-C  methocarbamol (ROBAXIN) 500 MG tablet take 1 tablet by mouth three times a day 05/09/16   Donella Stade, PA-C  montelukast (SINGULAIR) 10 MG tablet take 1 tablet by mouth at bedtime 10/08/15   Jade L Breeback, PA-C  neomycin-polymyxin-hydrocortisone (CORTISPORIN) otic solution Place 3 drops into both ears 3 (three) times daily as needed (for psoriasis in ear, only as needed).    Historical Provider, MD  nystatin [facility]) powder apply topically to affected area three times a day 03/15/16   Donella Stade, PA-C  nystatin ointment (MYCOSTATIN) apply to affected area twice a day 05/09/16   Donella Stade, PA-C  Olopatadine HCl 0.2 % SOLN Apply 1 drop to eye 2 (two) times daily as needed. 08/12/15   Jade L Breeback, PA-C  ondansetron (ZOFRAN) 4 MG tablet Take 1 tablet (4 mg total) by mouth every 4 (four)  hours as needed for nausea or vomiting. 12/18/14   Irene Shipper, MD  pantoprazole (PROTONIX) 40 MG tablet take 1 tablet by mouth once daily 02/23/16   Jade L Breeback, PA-C  pioglitazone (ACTOS) 30 MG tablet Take 1 tablet (30 mg total) by mouth daily. 03/15/16   Jade L Breeback, PA-C  pravastatin (PRAVACHOL) 40 MG tablet take 1 tablet by mouth once daily 04/18/16   Jade L Breeback, PA-C  predniSONE (DELTASONE) 20 MG tablet Take one tab by mouth twice daily for 5 days, then one daily. Take with food. 06/09/16   Kandra Nicolas, MD  propranolol (INDERAL) 20 MG tablet take 1 tablet by mouth twice a day 04/18/16   Donella Stade, PA-C  triamcinolone cream (KENALOG) 0.1 % Apply 1 application topically 2 (two) times daily. Patient taking differently: Apply 1 application topically as needed.  11/22/13   Donella Stade, PA-C    Family History Family History  Problem Relation Age of Onset  . Heart disease Brother   . Heart attack Brother   . Depression Brother   . Diabetes Brother   . Hypertension Brother   . Hyperlipidemia Brother   . Diabetes Mother   . Hyperlipidemia Mother   . Hypertension Mother   . Kidney disease Mother   . Depression Sister   . Diabetes Sister   . Hyperlipidemia Sister   . Hypertension Sister   . Stroke Sister   . Heart attack Brother   . Depression Brother   . Diabetes Brother   . Hyperlipidemia Brother   . Hypertension Brother   . Kidney cancer Brother   . Cancer Neg Hx     Social History Social History  Substance Use Topics  . Smoking status: Former Smoker    Types: Cigarettes    Quit date: 10/17/1996  . Smokeless tobacco: Never Used  . Alcohol use No     Allergies   Ace inhibitors; Cefaclor; Shrimp [shellfish allergy]; Ceclor [cefaclor]; Glyxambi [empagliflozin-linagliptin]; Sulfa antibiotics; and Sulfonamide derivatives   Review of Systems Review of  Systems + sore throat No cough No pleuritic pain No wheezing + nasal congestion + post-nasal  drainage No sinus pain/pressure No itchy/red eyes ? earache No hemoptysis No SOB No fever, + chills No nausea No vomiting + abdominal pain, improving + diarrhea, resolving No urinary symptoms + skin rash + fatigue + myalgias, resolved No headache    Physical Exam Triage Vital Signs ED Triage Vitals  Enc Vitals Group     BP 06/09/16 1815 139/80     Pulse Rate 06/09/16 1815 74     Resp --      Temp 06/09/16 1815 98.4 F (36.9 C)     Temp Source 06/09/16 1815 Oral     SpO2 06/09/16 1815 96 %     Weight 06/09/16 1815 261 lb (118.4 kg)     Height 06/09/16 1815 5\' 3"  (1.6 m)     Head Circumference --      Peak Flow --      Pain Score 06/09/16 1816 2     Pain Loc --      Pain Edu? --      Excl. in [place]? --    No data found.   Updated Vital Signs BP 139/80 (BP Location: Left Arm)   Pulse 74   Temp 98.4 F (36.9 C) (Oral)   Ht 5\' 3"  (1.6 m)   Wt 261 lb (118.4 kg)   LMP 10/18/1995   SpO2 96%   BMI 46.23 kg/m   Visual Acuity Right Eye Distance:   Left Eye Distance:   Bilateral Distance:    Right Eye Near:   Left Eye Near:    Bilateral Near:     Physical Exam Nursing notes and Vital Signs reviewed. Appearance:  Patient appears stated age, and in no acute distress Eyes:  Pupils are equal, round, and reactive to light and accomodation.  Extraocular movement is intact.  Conjunctivae are not inflamed  Ears:  Canals normal.  Tympanic membranes normal. There is distinct tenderness over both temporomandibular joints.  Palpation there recreates her pain.   Nose:   Congested turbinates.  No sinus tenderness.  Pharynx:  Normal Neck:  Supple.  Tender enlarged posterior/lateral nodes are palpated bilaterally, more prominent on the left.  Tonsillar nodes are mildly tender.  Lungs:  Clear to auscultation.  Breath sounds are equal.  Moving air well. Heart:  Regular rate and rhythm without murmurs, rubs, or gallops.  Abdomen:  Nontender without masses or hepatosplenomegaly.   Bowel sounds are present.  No CVA or flank tenderness.  Extremities:  No edema.     UC Treatments / Results  Labs (all labs ordered are listed, but only abnormal results are displayed) Labs Reviewed  STREP A DNA PROBE negative  POCT RAPID STREP A (OFFICE)   Tympanometry:  Right ear tympanogram normal; Left ear tympanogram low peak height. EKG  EKG Interpretation None       Radiology No results found.  Procedures Procedures (including critical care time)  Medications Ordered in UC Medications - No data to display   Initial Impression / Assessment and Plan / UC Course  I have reviewed the triage vital signs and the nursing notes.  Pertinent labs & imaging results that were available during my care of the patient were reviewed by me and considered in my medical decision making (see chart for details).  Clinical Course  Suspect early viral URI Throat culture pending. Her otalgia is partly caused by her bilateral TMJ tenderness. With her past  history of sinus issues, will begin prednisone burst/taper. Take plain guaifenesin (1200mg  extended release tabs such as Mucinex) twice daily, with plenty of water, for cough and congestion.  May add Pseudoephedrine (30mg , one or two every 4 to 6 hours) for sinus congestion.  Get adequate rest.   Also recommend using saline nasal spray several times daily and saline nasal irrigation (AYR is a common brand).    Continue Astelin nasal spray. Try warm salt water gargles for sore throat.  Stop all antihistamines for now, and other non-prescription cough/cold preparations. May take Delsym Cough Suppressant at bedtime for nighttime cough.  Begin Amoxicillin if not improving about one week or if persistent fever develops (Given a prescription to hold, with an expiration date)  Advised that she may use nystatin ointment in her introitus area. Follow-up with family doctor if not improving about10 days.      Final Clinical Impressions(s) / UC  Diagnoses   Final diagnoses:  Pharyngitis  Viral pharyngitis  Otalgia of both ears    New Prescriptions Discharge Medication List as of 06/09/2016  7:02 PM    START taking these medications   Details  amoxicillin (AMOXIL) 875 MG tablet Take 1 tablet (875 mg total) by mouth 2 (two) times daily. (Rx void after 06/17/16), Starting Thu 06/09/2016, Print    predniSONE (DELTASONE) 20 MG tablet Take one tab by mouth twice daily for 5 days, then one daily. Take with food., Print         Kandra Nicolas, MD 06/09/16 [phone]

## 2016-06-09 NOTE — Discharge Instructions (Signed)
Take plain guaifenesin (1200mg  extended release tabs such as Mucinex) twice daily, with plenty of water, for cough and congestion.  May add Pseudoephedrine (30mg , one or two every 4 to 6 hours) for sinus congestion.  Get adequate rest.   Also recommend using saline nasal spray several times daily and saline nasal irrigation (AYR is a common brand).    Continue Astelin nasal spray. Try warm salt water gargles for sore throat.  Stop all antihistamines for now, and other non-prescription cough/cold preparations. May take Delsym Cough Suppressant at bedtime for nighttime cough.  Begin Amoxicillin if not improving about one week or if persistent fever develops   Follow-up with family doctor if not improving about10 days.

## 2016-06-09 NOTE — ED Triage Notes (Signed)
2 weeks, Sinus, runny nose, headache, ears hurting, fever Sat-Mon, diarrhea 2-3 days taking Flagyl, yeast infection now.

## 2016-06-10 ENCOUNTER — Telehealth: Payer: Self-pay | Admitting: *Deleted

## 2016-06-10 LAB — STREP A DNA PROBE: GASP: NOT DETECTED

## 2016-06-10 NOTE — Telephone Encounter (Signed)
LM for pt with Tcx results and to call back if she has any questions or concerns. Charna Archer, LPN

## 2016-06-21 ENCOUNTER — Encounter: Payer: Self-pay | Admitting: Physician Assistant

## 2016-06-21 ENCOUNTER — Ambulatory Visit (INDEPENDENT_AMBULATORY_CARE_PROVIDER_SITE_OTHER): Payer: Medicare Other | Admitting: Physician Assistant

## 2016-06-21 VITALS — BP 117/46 | HR 70 | Ht 63.0 in | Wt 262.0 lb

## 2016-06-21 DIAGNOSIS — M5440 Lumbago with sciatica, unspecified side: Secondary | ICD-10-CM

## 2016-06-21 DIAGNOSIS — E1142 Type 2 diabetes mellitus with diabetic polyneuropathy: Secondary | ICD-10-CM

## 2016-06-21 DIAGNOSIS — M545 Low back pain, unspecified: Secondary | ICD-10-CM

## 2016-06-21 DIAGNOSIS — Z794 Long term (current) use of insulin: Secondary | ICD-10-CM

## 2016-06-21 DIAGNOSIS — G894 Chronic pain syndrome: Secondary | ICD-10-CM

## 2016-06-21 DIAGNOSIS — Z23 Encounter for immunization: Secondary | ICD-10-CM

## 2016-06-21 DIAGNOSIS — E118 Type 2 diabetes mellitus with unspecified complications: Secondary | ICD-10-CM | POA: Diagnosis not present

## 2016-06-21 LAB — POCT GLYCOSYLATED HEMOGLOBIN (HGB A1C): Hemoglobin A1C: 7.4

## 2016-06-21 MED ORDER — PROPRANOLOL HCL 20 MG PO TABS: 20.0000 mg | ORAL_TABLET | Freq: Two times a day (BID) | ORAL | 5 refills | Status: DC

## 2016-06-21 MED ORDER — METFORMIN HCL 1000 MG PO TABS: 1000.0000 mg | ORAL_TABLET | Freq: Two times a day (BID) | ORAL | 0 refills | Status: DC

## 2016-06-21 MED ORDER — HYDROCODONE-ACETAMINOPHEN 5-325 MG PO TABS: ORAL_TABLET | ORAL | 0 refills | Status: DC

## 2016-06-21 MED ORDER — LIRAGLUTIDE 18 MG/3ML ~~LOC~~ SOPN: 1.2000 mg | PEN_INJECTOR | Freq: Every day | SUBCUTANEOUS | 2 refills | Status: DC

## 2016-06-21 MED ORDER — INSULIN GLARGINE 100 UNIT/ML ~~LOC~~ SOLN: 40.0000 [IU] | Freq: Every day | SUBCUTANEOUS | 0 refills | Status: DC

## 2016-06-21 MED ORDER — DULOXETINE HCL 60 MG PO CPEP: ORAL_CAPSULE | ORAL | 2 refills | Status: DC

## 2016-06-21 MED ORDER — PIOGLITAZONE HCL 30 MG PO TABS: 30.0000 mg | ORAL_TABLET | Freq: Every day | ORAL | 5 refills | Status: DC

## 2016-06-21 NOTE — Progress Notes (Signed)
   Subjective:    Patient ID: [name] Rice, female    DOB: [birth year], 61 y.o.   MRN: [id number]  HPI Patient is a 60 year old female who presents to the clinic for diabeticFollow-up. She admits she has not kept a diabetic diet. She's also been sick over the last 3 months and had 2 rounds of prednisone. She reports that her sugars go up to 600 at times when she takes prednisone. She denies any hypoglycemic events. She denies any open sores or unhealed wounds. She is taking 40 units of Lantus at bedtime. She's taking victoza, Actos and metformin. She has not been taking the novolog.    Review of Systems See HPI.     Objective:   Physical Exam  Constitutional: She is oriented to person, place, and time. She appears well-developed and well-nourished.  Morbid obesity  HENT:  Head: Normocephalic and atraumatic.  Cardiovascular: Normal rate, regular rhythm and normal heart sounds.   Pulmonary/Chest: Effort normal and breath sounds normal.  Neurological: She is alert and oriented to person, place, and time.  Psychiatric: She has a normal mood and affect. Her behavior is normal.          Assessment & Plan:  DM, type II- flu shot given today. A!C 7.4. Up from 6.9 3 months ago. She did have 2 rounds of prednisone. When she takes prednisone sugars spike at times to 600.  pneumonia 23 shot done. Continue lantus, victoza, actos and metformin.  Up to date eye exam.   On statin.   Morbid obesity/chronic back pain- norco refilled today. Pt has pain contract. UDS this year.

## 2016-07-21 ENCOUNTER — Other Ambulatory Visit: Payer: Self-pay

## 2016-07-21 MED ORDER — HYDROCODONE-ACETAMINOPHEN 5-325 MG PO TABS: ORAL_TABLET | ORAL | 0 refills | Status: DC

## 2016-08-08 ENCOUNTER — Other Ambulatory Visit: Payer: Self-pay | Admitting: Physician Assistant

## 2016-08-22 ENCOUNTER — Other Ambulatory Visit: Payer: Self-pay | Admitting: Physician Assistant

## 2016-08-22 ENCOUNTER — Other Ambulatory Visit: Payer: Self-pay

## 2016-08-22 MED ORDER — HYDROCODONE-ACETAMINOPHEN 5-325 MG PO TABS: ORAL_TABLET | ORAL | 0 refills | Status: DC

## 2016-09-14 ENCOUNTER — Other Ambulatory Visit: Payer: Self-pay | Admitting: Physician Assistant

## 2016-09-14 ENCOUNTER — Telehealth: Payer: Self-pay

## 2016-09-14 ENCOUNTER — Other Ambulatory Visit: Payer: Self-pay | Admitting: Family Medicine

## 2016-09-14 DIAGNOSIS — G249 Dystonia, unspecified: Secondary | ICD-10-CM

## 2016-09-14 NOTE — Telephone Encounter (Signed)
Ok for referral?

## 2016-09-16 ENCOUNTER — Emergency Department: Payer: Medicare Other

## 2016-09-16 ENCOUNTER — Encounter: Payer: Self-pay | Admitting: *Deleted

## 2016-09-16 ENCOUNTER — Emergency Department (INDEPENDENT_AMBULATORY_CARE_PROVIDER_SITE_OTHER)
Admission: EM | Admit: 2016-09-16 | Discharge: 2016-09-16 | Disposition: A | Discharge status: Home / Self Care | Payer: Medicare Other | Source: Home / Self Care | Attending: Family Medicine | Admitting: Family Medicine

## 2016-09-16 ENCOUNTER — Emergency Department (INDEPENDENT_AMBULATORY_CARE_PROVIDER_SITE_OTHER): Payer: Medicare Other

## 2016-09-16 ENCOUNTER — Emergency Department (HOSPITAL_BASED_OUTPATIENT_CLINIC_OR_DEPARTMENT_OTHER): Payer: Self-pay

## 2016-09-16 DIAGNOSIS — M5441 Lumbago with sciatica, right side: Secondary | ICD-10-CM

## 2016-09-16 DIAGNOSIS — G8929 Other chronic pain: Secondary | ICD-10-CM

## 2016-09-16 DIAGNOSIS — M5442 Lumbago with sciatica, left side: Secondary | ICD-10-CM

## 2016-09-16 DIAGNOSIS — R52 Pain, unspecified: Secondary | ICD-10-CM

## 2016-09-16 DIAGNOSIS — M25551 Pain in right hip: Secondary | ICD-10-CM

## 2016-09-16 DIAGNOSIS — M25552 Pain in left hip: Secondary | ICD-10-CM

## 2016-09-16 DIAGNOSIS — W19XXXA Unspecified fall, initial encounter: Secondary | ICD-10-CM | POA: Diagnosis not present

## 2016-09-16 DIAGNOSIS — S79912A Unspecified injury of left hip, initial encounter: Secondary | ICD-10-CM | POA: Diagnosis not present

## 2016-09-16 MED ORDER — HYDROCODONE-ACETAMINOPHEN 5-325 MG PO TABS: 1.0000 | ORAL_TABLET | Freq: Four times a day (QID) | ORAL | 0 refills | Status: DC | PRN

## 2016-09-16 NOTE — ED Triage Notes (Signed)
Pt c/o LT side low back and buttock pain post fall 10 days ago. She has taken hydrocodone 5/325 for pain and she has run out.

## 2016-09-16 NOTE — Discharge Instructions (Signed)
°  Norco/Vicodin (hydrocodone-acetaminophen) is a narcotic pain medication, do not combine these medications with others containing tylenol. While taking, do not drink alcohol, drive, or perform any other activities that requires focus while taking these medications.  ° °

## 2016-09-16 NOTE — ED Provider Notes (Signed)
CSN: [id number]     Arrival date & time 09/16/16  1258 History   First MD Initiated Contact with Patient 09/16/16 1321     Chief Complaint  Patient presents with  . Back Pain   (Consider location/radiation/quality/duration/timing/severity/associated sxs/prior Treatment) HPI  [name] Rice is a 60 y.o. female presenting to UC with c/o chronic low back pain exacerbated after falling backward onto cement 10 days ago after tripping while trying to walk up a step while carrying groceries.  Pain is aching and throbbing, worse with ambulation and certain positions while sitting.  Pain is 8/10, worse on Left side and in Left buttock.  She notes she typically takes norco but is out of her medication. She does not have f/u with PCP until 09/20/16. Denies other injuries during the fall. No new numbness or pain in legs. No change in bowel or bladder habits.    Past Medical History:  Diagnosis Date  . Anxiety   . Arthritis   . Asthma    no PFTs  . Cervical dystonia   . COPD (chronic obstructive pulmonary disease) (HCC)    no PFTs  . Depression   . Diabetes mellitus   . Diarrhea   . Diverticulitis   . Diverticulosis   . Dizziness   . Gastroparesis   . GERD (gastroesophageal reflux disease)   . History of colonic polyps    hyperplastic  . History of kidney stones   . History of seborrheic dermatitis    face,ears  . HLD (hyperlipidemia)   . Hypertension   . Intussusception ([place])   . Lumbar pain 2011   per x-ray - Multilevel spondylosis  . Morbid obesity ([place])   . Nephrolithiasis   . OSA on CPAP 2008  . Pneumonia   . Recurrent boils    "SLUSTER BOILS" ON ABD AND LABIA  . Sleep apnea    USES C-PAP  . Torn rotator cuff RT SHOULDER  . Tremor CCENTRAL NERVOUS SYSTEM   Past Surgical History:  Procedure Laterality Date  . CARPAL TUNNEL RELEASE Left 2006  . ELBOW SURGERY Left   . HAND SURGERY Right RIGHT  . JOINT REPLACEMENT Right    shoulder  . KNEE ARTHROSCOPY Left   . LUMBAR  LAMINECTOMY    . MASS EXCISION  06/28/2012   Procedure: EXCISION MASS;  Surgeon: Harl Bowie, MD;  Location: [facility];  Service: General;  Laterality: Left;  excision left upper quadrant abdominal wall mass, Benign Lipoma  . NASAL SINUS SURGERY    . PILONIDAL CYST EXCISION  [id number]  . TUBAL LIGATION     Family History  Problem Relation Age of Onset  . Heart disease Brother   . Heart attack Brother   . Depression Brother   . Diabetes Brother   . Hypertension Brother   . Hyperlipidemia Brother   . Diabetes Mother   . Hyperlipidemia Mother   . Hypertension Mother   . Kidney disease Mother   . Depression Sister   . Diabetes Sister   . Hyperlipidemia Sister   . Hypertension Sister   . Stroke Sister   . Heart attack Brother   . Depression Brother   . Diabetes Brother   . Hyperlipidemia Brother   . Hypertension Brother   . Kidney cancer Brother   . Cancer Neg Hx    Social History  Substance Use Topics  . Smoking status: Former Smoker    Types: Cigarettes    Quit date: 10/17/1996  . Smokeless  tobacco: Never Used  . Alcohol use No   OB History    No data available     Review of Systems  Musculoskeletal: Positive for arthralgias, back pain, gait problem and myalgias. Negative for joint swelling, neck pain and neck stiffness.  Skin: Negative for color change and wound.  Neurological: Positive for numbness (chronic intermittent Left leg). Negative for weakness.    Allergies  Ace inhibitors; Cefaclor; Shrimp [shellfish allergy]; Ceclor [cefaclor]; Glyxambi [empagliflozin-linagliptin]; Sulfa antibiotics; and Sulfonamide derivatives  Home Medications   Prior to Admission medications   Medication Sig Start Date End Date Taking? Authorizing Provider  ADVAIR DISKUS 250-50 MCG/DOSE AEPB inhale 1 puff by mouth twice a day 08/24/16   Jade L Breeback, PA-C  albuterol (PROAIR HFA) 108 (90 Base) MCG/ACT inhaler Inhale 2 puffs into the lungs every 6 (six) hours as needed for  wheezing or shortness of breath. 10/23/15   Jade L Breeback, PA-C  AMBULATORY NON FORMULARY MEDICATION Diabetic shoes  Dx: Diabetes, uncontrolled 250.60 with neuropathy. 05/27/14   Jade L Breeback, PA-C  AMBULATORY NON FORMULARY MEDICATION Test strips and needles for one touch Verio IQ glucometer.  Use to test blood sugar up to four times a day. Diagnosis Type 2 Diabetes 03/17/15   Marcial Pacas, DO  azelastine (ASTELIN) 0.1 % nasal spray Place 2 sprays into both nostrils 2 (two) times daily. Use in each nostril as directed 03/28/16   Jade L Breeback, PA-C  B-D INS SYR ULTRAFINE 1CC/30G 30G X 1/2" 1 ML MISC use as directed three times a day 08/24/16   Jade L Breeback, PA-C  DULoxetine (CYMBALTA) 60 MG capsule Take 2  tablets daily for depression. 06/21/16   Jade L Breeback, PA-C  furosemide (LASIX) 40 MG tablet take 1 tablet by mouth once daily 02/23/16   Jade L Breeback, PA-C  gabapentin (NEURONTIN) 800 MG tablet Take 1 tablet (800 mg total) by mouth 3 (three) times daily. 02/24/16   Jade L Breeback, PA-C  glucose blood (ONETOUCH VERIO) test strip Check blood sugar 3 times daily. Diagnosis: Type II diabetes mellitus with neurological manifestations (E11.49) 09/09/15   Jade L Breeback, PA-C  HYDROcodone-acetaminophen (NORCO/VICODIN) 5-325 MG tablet TAKE 1 TO 2 TABLETS BY MOUTH TWICE DAILY. 08/22/16   Jade L Breeback, PA-C  HYDROcodone-acetaminophen (NORCO/VICODIN) 5-325 MG tablet Take 1-2 tablets by mouth every 6 (six) hours as needed. 09/16/16   Noland Fordyce, PA-C  insulin aspart (NOVOLOG) 100 UNIT/ML injection Inject into the skin 3 (three) times daily before meals.    Historical Provider, MD  insulin glargine (LANTUS) 100 UNIT/ML injection Inject 0.4 mLs (40 Units total) into the skin at bedtime. 06/21/16   Jade L Breeback, PA-C  Insulin Pen Needle (NOVOFINE) 32G X 6 MM MISC Inject once daily [place] 06/12/14   Jade L Breeback, PA-C  Liraglutide (VICTOZA) 18 MG/3ML SOPN Inject 0.2 mLs (1.2 mg total) into the skin  daily. 06/21/16   Jade L Breeback, PA-C  LORazepam (ATIVAN) 0.5 MG tablet Take 1 tablet (0.5 mg total) by mouth every 8 (eight) hours as needed for anxiety. 03/28/16   Jade L Breeback, PA-C  meclizine (ANTIVERT) 25 MG tablet Take 1 tablet (25 mg total) by mouth 3 (three) times daily as needed for dizziness. 12/30/14   Jade L Breeback, PA-C  meloxicam (MOBIC) 15 MG tablet Take 1 tablet (15 mg total) by mouth daily. 02/16/16   Jade L Breeback, PA-C  metFORMIN (GLUCOPHAGE) 1000 MG tablet Take 1 tablet (1,000 mg total) by  mouth 2 (two) times daily with a meal. 06/21/16   Jade L Breeback, PA-C  methocarbamol (ROBAXIN) 500 MG tablet take 1 tablet by mouth three times a day 05/09/16   Donella Stade, PA-C  montelukast (SINGULAIR) 10 MG tablet take 1 tablet by mouth at bedtime 08/08/16   Hali Marry, MD  neomycin-polymyxin-hydrocortisone (CORTISPORIN) otic solution Place 3 drops into both ears 3 (three) times daily as needed (for psoriasis in ear, only as needed).    Historical Provider, MD  [facility] powder apply to affected area three times a day 09/14/16   Donella Stade, PA-C  nystatin ointment (MYCOSTATIN) apply to affected area twice a day 05/09/16   Jade L Breeback, PA-C  ondansetron (ZOFRAN) 4 MG tablet Take 1 tablet (4 mg total) by mouth every 4 (four) hours as needed for nausea or vomiting. 12/18/14   Irene Shipper, MD  pantoprazole (PROTONIX) 40 MG tablet take 1 tablet by mouth once daily 02/23/16   Jade L Breeback, PA-C  PATADAY 0.2 % SOLN instill 1 drop into both eyes twice a day if needed 08/24/16   Jade L Breeback, PA-C  pioglitazone (ACTOS) 30 MG tablet Take 1 tablet (30 mg total) by mouth daily. 06/21/16   Jade L Breeback, PA-C  pravastatin (PRAVACHOL) 40 MG tablet take 1 tablet by mouth once daily 04/18/16   Jade L Breeback, PA-C  predniSONE (DELTASONE) 20 MG tablet Take one tab by mouth twice daily for 5 days, then one daily. Take with food. 06/09/16   Kandra Nicolas, MD  propranolol (INDERAL) 20 MG  tablet Take 1 tablet (20 mg total) by mouth 2 (two) times daily. 06/21/16   Jade L Breeback, PA-C  triamcinolone cream (KENALOG) 0.1 % Apply 1 application topically 2 (two) times daily. Patient taking differently: Apply 1 application topically as needed.  11/22/13   Donella Stade, PA-C   Meds Ordered and Administered this Visit  Medications - No data to display  BP 170/84 (BP Location: Left Arm)   Pulse 75   Temp 98.1 F (36.7 C) (Oral)   Resp 18   Wt 262 lb (118.8 kg)   LMP 10/18/1995   SpO2 94%   BMI 46.41 kg/m  No data found.   Physical Exam  Constitutional: She is oriented to person, place, and time. She appears well-developed and well-nourished. No distress.  HENT:  Head: Normocephalic and atraumatic.  Eyes: EOM are normal.  Neck: Normal range of motion. Neck supple.  No midline bone tenderness, no crepitus or step-offs.   Cardiovascular: Normal rate.   Pulmonary/Chest: Effort normal.  Musculoskeletal: Normal range of motion. She exhibits no edema.  No midline spinal tenderness. Tenderness to Left lower lumbar muscles, Left buttock and Left hip.   Exam limited due to pt's body habitus. No crepitus noted on exam.  Neurological: She is alert and oriented to person, place, and time.  Antalgic gait, uses cane for assistance.   Skin: Skin is warm and dry. She is not diaphoretic.  Psychiatric: She has a normal mood and affect. Her behavior is normal.  Nursing note and vitals reviewed.   Urgent Care Course   Clinical Course     Procedures (including critical care time)  Labs Review Labs Reviewed - No data to display  Imaging Review Dg Hip Unilat With Pelvis Min 4 Views Left  Result Date: 09/16/2016 CLINICAL DATA:  [place] 10 days ago with left hip injury. Posterior head pain. Initial encounter. EXAM: DG HIP (  WITH OR WITHOUT PELVIS) 4+V LEFT COMPARISON:  Pelvis/right hip radiographs 03/28/2016 FINDINGS: There is no evidence of acute fracture or dislocation. Minimal  acetabular spurring is unchanged. No significant hip joint space narrowing for age. No focal osseous lesion or soft tissue abnormality is seen. IMPRESSION: No evidence of acute osseous abnormality. Electronically Signed   By: Logan Bores M.D.   On: 09/16/2016 14:45     MDM   1. Chronic bilateral low back pain with bilateral sciatica   2. Pain of both hip joints   3. Pain    Pt c/o exacerbation of chronic low back pain and Left buttock pain after fall 10 days ago. No new symptoms besides worsening pain. Pt requesting refill on pain medication as she cannot f/u with PCP until 09/20/16.  Plain films: negative for acute bony injury Rx: Norco (15 tabs)  Home care instructions provided. F/u with PCP as scheduled.     Noland Fordyce, PA-C 09/16/16 [phone]

## 2016-09-16 NOTE — Telephone Encounter (Signed)
Spoke with Pt, would like referral to Neurology for Dystonia.

## 2016-09-18 ENCOUNTER — Other Ambulatory Visit: Payer: Self-pay | Admitting: Family Medicine

## 2016-09-20 ENCOUNTER — Ambulatory Visit: Payer: Self-pay | Admitting: Physician Assistant

## 2016-09-21 ENCOUNTER — Ambulatory Visit (INDEPENDENT_AMBULATORY_CARE_PROVIDER_SITE_OTHER): Payer: Medicare Other | Admitting: Physician Assistant

## 2016-09-21 ENCOUNTER — Encounter: Payer: Self-pay | Admitting: Physician Assistant

## 2016-09-21 ENCOUNTER — Ambulatory Visit (INDEPENDENT_AMBULATORY_CARE_PROVIDER_SITE_OTHER): Payer: Medicare Other

## 2016-09-21 VITALS — BP 145/80 | HR 64 | Ht 63.0 in | Wt 269.0 lb

## 2016-09-21 DIAGNOSIS — E118 Type 2 diabetes mellitus with unspecified complications: Secondary | ICD-10-CM | POA: Diagnosis not present

## 2016-09-21 DIAGNOSIS — G249 Dystonia, unspecified: Secondary | ICD-10-CM | POA: Insufficient documentation

## 2016-09-21 DIAGNOSIS — M479 Spondylosis, unspecified: Secondary | ICD-10-CM | POA: Diagnosis not present

## 2016-09-21 DIAGNOSIS — H8110 Benign paroxysmal vertigo, unspecified ear: Secondary | ICD-10-CM

## 2016-09-21 DIAGNOSIS — M545 Low back pain: Secondary | ICD-10-CM | POA: Diagnosis not present

## 2016-09-21 DIAGNOSIS — M542 Cervicalgia: Secondary | ICD-10-CM

## 2016-09-21 LAB — POCT GLYCOSYLATED HEMOGLOBIN (HGB A1C): HEMOGLOBIN A1C: 6.8

## 2016-09-21 MED ORDER — HYDROCODONE-ACETAMINOPHEN 5-325 MG PO TABS: ORAL_TABLET | ORAL | 0 refills | Status: DC

## 2016-09-21 MED ORDER — LORAZEPAM 0.5 MG PO TABS: 0.5000 mg | ORAL_TABLET | Freq: Three times a day (TID) | ORAL | 1 refills | Status: DC | PRN

## 2016-09-21 MED ORDER — KETOROLAC TROMETHAMINE 60 MG/2ML IM SOLN
60.0000 mg | Freq: Once | INTRAMUSCULAR | Status: AC
  Administered 2016-09-21: 60 mg via INTRAMUSCULAR

## 2016-09-21 MED ORDER — KETOROLAC TROMETHAMINE 60 MG/2ML IM SOLN: 60.0000 mg | Freq: Once | INTRAMUSCULAR | Status: DC

## 2016-09-21 MED ORDER — MECLIZINE HCL 25 MG PO TABS: 25.0000 mg | ORAL_TABLET | Freq: Three times a day (TID) | ORAL | 0 refills | Status: DC | PRN

## 2016-09-21 MED ORDER — PREDNISONE 20 MG PO TABS: ORAL_TABLET | ORAL | 0 refills | Status: DC

## 2016-09-21 NOTE — Progress Notes (Signed)
Call pt: degenerative changes of neck. Normal alignment. No fracture.

## 2016-09-21 NOTE — Progress Notes (Addendum)
Subjective:    Patient ID: [name] Rice, female    DOB: 10-25-1955, 60 y.o.   MRN: [id number]  HPI  Patient is a 60 yo female coming to the clinic for a diabetes follow-up. Last Hgb A1c three months ago was 7.4. Today patient's Hgb A1c was 6.8. Patient reports that she has been taking her insulin, metformin and victoza as directed. Patient reports peripheral neuropathy but no sores or ulcers on feet bilaterally. Patient denies hypoglycemic episodes.   Patient is requesting a refill for Ativan. Patient reports that she has been taking Ativan twice a day for the last three weeks because her dystonia has been affecting her neck. Patient reports that she was doing Botox injections in the past for dystonia; however, she cannot afford them at this time. Patient was referred to a neurologist a week ago to manage dystonia. Patient would also like a refill on meclizine for when she experiences vertigo.   Patient reports that she fell on November 21 of this year stepping onto a curb and injured her neck and lower back. Patient rates her pain a 10/10 and the pain is constant. She reports that is difficult to sleep.This morning at 1 am, patient took two hydrocodone, one gabapentin and one robaxin with some relief. Patient has been alternating with ice and heating pad on back and neck. Patient went to urgent care on December 1 of this year and was given hydrocodone. She reports that she has been taking two at a time every 4 hours with relief. She reports having what she describes as terrible lower back spasms for three days.    Review of Systems  Respiratory: Negative for cough and shortness of breath.   Cardiovascular: Negative for chest pain, palpitations and leg swelling.  Musculoskeletal: Positive for back pain, myalgias and neck pain.       Objective: Blood pressure (!) 145/80, pulse 64, height 5\' 3"  (1.6 m), weight 269 lb (122 kg), last menstrual period 10/18/1995.   Physical Exam  Constitutional:  She is oriented to person, place, and time. She appears well-developed and well-nourished.  Patient does not want to sit up on exam table due to pain. Patient is standing during examination.   Neck:  ROM limited with neck flexion and extension and rotation.   Cardiovascular: Normal rate, regular rhythm and normal heart sounds.   Pulmonary/Chest: Breath sounds normal.  Musculoskeletal: She exhibits tenderness (Tenderness to palpation to cervical spine, around C6 and lumbar spine around L5. Tenderness to palpation of lower back muscles. Patient is unable to bend over due to pain.   ). She exhibits no edema or deformity.  Neurological: She is alert and oriented to person, place, and time.  Skin:  One 2cm by 2cm ulcer and one 1cm by 1cm on right buttock. Both ulcers look like they're healing.    Labs and imaging:   Hgb A1c: 6.8 DG cervical spine impression: Spondylosis. No acute bony abnormality.  Assessment & Plan:   [place] was seen today for diabetes.  Diagnoses and all orders for this visit:  1. Type 2 diabetes mellitus with complication, unspecified long term insulin use status (HCC) - POCT HgB A1C, monitor every three months. Today patient's A1c is 6.8 and is well controlled on insulin, metformin and victozia. Continue taking these medications to control diabetes. Patient has a few sores on right buttocks- stage two. Patient advised to move often and not sit or lay in the same spot for more than two hours.  No ulcers on patient's feet bilaterally.  -Follow-up in three months for diabetes mellitus and to monitorHgb A1c.   2. Neck pain and lower back pain  -DG Cervical Spine Complete due to patient being tender to palpation around C6. DG cervical spine impression: Spondylosis. No acute bony abnormality.  -PredniSONE (DELTASONE) 20 MG tablet; Take 3 tablets for 3 days, take 2 tablets for 3 days, take 1 tablet for 3 days, take 1/2 tablet for 4 days to help with acute inflammation.   -HYDROcodone-acetaminophen (NORCO/VICODIN) 5-325 MG tablet; TAKE 1 TO 2 TABLETS BY MOUTH TWICE DAILY due to patient experiencing acute severe pain from fall two weeks ago. Patient advised to not drive while taking this medication.  -Ketorolac (TORADOL) injection 60 mg given in the clinic today at 11:30am to help with acute neck and lower back pain related to patient falling two weeks ago. -Continue using heat and ice for back pain and neck pain. Continue using Robaxin for muscle spasms.  -Call the office if symptoms persist to get an appointment scheduled with sports medicine.  3. Dystonia -Refilled LORazepam (ATIVAN) 0.5 MG tablet to help patient with dystonia of neck until patient can see a neurologist to manage dystonia.  4. BPPV (benign paroxysmal positional vertigo), unspecified laterality -Refilled meclizine (ANTIVERT) 25 MG tablet; Take 1 tablet (25 mg total) by mouth 3 (three) times daily as needed for dizziness when patient experiences vertigo.

## 2016-09-23 ENCOUNTER — Other Ambulatory Visit: Payer: Self-pay | Admitting: Internal Medicine

## 2016-09-23 ENCOUNTER — Other Ambulatory Visit: Payer: Self-pay | Admitting: Physician Assistant

## 2016-09-27 ENCOUNTER — Telehealth: Payer: Self-pay

## 2016-09-27 NOTE — Telephone Encounter (Signed)
LEFT VM FOR PT TO CONTACT DR. MORRIS AT [phone] TO SET UP APPOINTMENT TIME AND DATE.

## 2016-09-28 ENCOUNTER — Telehealth: Payer: Self-pay | Admitting: Physician Assistant

## 2016-09-28 NOTE — Telephone Encounter (Signed)
Pt called clinic requesting Rx for: Metronidazole 250mg  tablet, one tab TID PRN for gastroparesis. States she has been getting this Rx from her GI Dr and they are requiring an appt for a refill. Would like to start getting this from PCP. Pharmacy on file is correct.

## 2016-10-03 NOTE — Telephone Encounter (Signed)
Can we get labs for GI doctor confirming that is how he is giving it.

## 2016-10-04 NOTE — Telephone Encounter (Signed)
Left VM for Pt to see who her current GI Physician is.

## 2016-10-06 ENCOUNTER — Telehealth: Payer: Self-pay | Admitting: Internal Medicine

## 2016-10-06 NOTE — Telephone Encounter (Signed)
Returned call and [name] Rice had gotten her question answered already.

## 2016-10-06 NOTE — Telephone Encounter (Signed)
metroNIDAZOLE (FLAGYL) 250 MG tablet [id number] ENDED  Order Details  Dose: 250 mg Route: Oral Frequency: 3 times daily  Note to Pharmacy:  Patient needs office visit for further refills      Dispense Quantity:  30 tablet Refills:  0 Fills remaining:  --        Sig: Take 1 tablet (250 mg total) by mouth 3 (three) times daily.       Written Date:  05/17/16 Expiration Date:  05/17/17    Start Date:  05/17/16 End Date:  05/27/16 after 30 doses         Ordering Provider:  Irene Shipper, MD        Please see latest Rx written from GI (Dr. Henrene Pastor). Notes within [facility]

## 2016-10-06 NOTE — Telephone Encounter (Signed)
Pt called back to let you know that her GI doctor is Dr. Henrene Pastor.

## 2016-10-07 ENCOUNTER — Other Ambulatory Visit: Payer: Self-pay | Admitting: Physician Assistant

## 2016-10-07 NOTE — Telephone Encounter (Signed)
Can we get records for Dr. Henrene Pastor. I do not see in system. I will rx for 1-2 months while we figure this out. I am not accustomed to daily metroniazole. infact wondering if this could be making you nauseated all the time. That is its major side effect.

## 2016-10-07 NOTE — Telephone Encounter (Signed)
So I read up on this you are not supposed to take this medication daily but only three times a day for 10 days when you get a flare. He ok regular refills as needed but not to take daily. Do you understand? Are you needing 10 day supply now?

## 2016-10-07 NOTE — Telephone Encounter (Signed)
Sending back for clarification.

## 2016-10-18 ENCOUNTER — Other Ambulatory Visit: Payer: Self-pay | Admitting: Internal Medicine

## 2016-10-19 ENCOUNTER — Other Ambulatory Visit: Payer: Self-pay

## 2016-10-19 DIAGNOSIS — M542 Cervicalgia: Secondary | ICD-10-CM

## 2016-10-19 DIAGNOSIS — M545 Low back pain: Secondary | ICD-10-CM

## 2016-10-19 MED ORDER — HYDROCODONE-ACETAMINOPHEN 5-325 MG PO TABS: ORAL_TABLET | ORAL | 0 refills | Status: DC

## 2016-10-22 ENCOUNTER — Other Ambulatory Visit: Payer: Self-pay | Admitting: Physician Assistant

## 2016-10-24 ENCOUNTER — Other Ambulatory Visit: Payer: Self-pay | Admitting: Physician Assistant

## 2016-10-24 DIAGNOSIS — E1142 Type 2 diabetes mellitus with diabetic polyneuropathy: Secondary | ICD-10-CM

## 2016-11-12 ENCOUNTER — Other Ambulatory Visit: Payer: Self-pay | Admitting: Physician Assistant

## 2016-11-14 DIAGNOSIS — G629 Polyneuropathy, unspecified: Secondary | ICD-10-CM | POA: Diagnosis not present

## 2016-11-14 DIAGNOSIS — G249 Dystonia, unspecified: Secondary | ICD-10-CM | POA: Diagnosis not present

## 2016-11-14 DIAGNOSIS — M5441 Lumbago with sciatica, right side: Secondary | ICD-10-CM | POA: Diagnosis not present

## 2016-11-14 DIAGNOSIS — M79604 Pain in right leg: Secondary | ICD-10-CM | POA: Diagnosis not present

## 2016-11-14 DIAGNOSIS — G2581 Restless legs syndrome: Secondary | ICD-10-CM | POA: Diagnosis not present

## 2016-11-14 DIAGNOSIS — M62838 Other muscle spasm: Secondary | ICD-10-CM | POA: Diagnosis not present

## 2016-11-18 ENCOUNTER — Other Ambulatory Visit: Payer: Self-pay | Admitting: Physician Assistant

## 2016-11-23 ENCOUNTER — Telehealth: Payer: Self-pay | Admitting: Emergency Medicine

## 2016-11-23 ENCOUNTER — Other Ambulatory Visit: Payer: Self-pay | Admitting: Physician Assistant

## 2016-11-23 DIAGNOSIS — M545 Low back pain: Secondary | ICD-10-CM

## 2016-11-23 DIAGNOSIS — M542 Cervicalgia: Secondary | ICD-10-CM

## 2016-11-23 MED ORDER — HYDROCODONE-ACETAMINOPHEN 5-325 MG PO TABS: ORAL_TABLET | ORAL | 0 refills | Status: DC

## 2016-11-23 NOTE — Telephone Encounter (Signed)
Patient called to request refill on Hydrocodone; last ordered 10/19/16 with no refills. She will call before coming to pick up script tomorrow.

## 2016-11-23 NOTE — Telephone Encounter (Signed)
Ok - ready for pick up

## 2016-11-26 ENCOUNTER — Other Ambulatory Visit: Payer: Self-pay | Admitting: Physician Assistant

## 2016-11-26 DIAGNOSIS — J4520 Mild intermittent asthma, uncomplicated: Secondary | ICD-10-CM

## 2016-11-29 ENCOUNTER — Ambulatory Visit (INDEPENDENT_AMBULATORY_CARE_PROVIDER_SITE_OTHER): Payer: Medicare Other | Admitting: Physician Assistant

## 2016-11-29 ENCOUNTER — Encounter: Payer: Self-pay | Admitting: Physician Assistant

## 2016-11-29 VITALS — BP 159/78 | HR 70 | Ht 63.75 in | Wt 272.4 lb

## 2016-11-29 DIAGNOSIS — J441 Chronic obstructive pulmonary disease with (acute) exacerbation: Secondary | ICD-10-CM | POA: Diagnosis not present

## 2016-11-29 MED ORDER — LEVOCETIRIZINE DIHYDROCHLORIDE 5 MG PO TABS: 5.0000 mg | ORAL_TABLET | Freq: Every evening | ORAL | 5 refills | Status: DC

## 2016-11-29 MED ORDER — PREDNISONE 20 MG PO TABS: ORAL_TABLET | ORAL | 0 refills | Status: DC

## 2016-11-29 NOTE — Progress Notes (Signed)
   Subjective:    Patient ID: [name] Rice, female    DOB: 02/09/1956, 61 y.o.   MRN: [id number]  HPI  Pt is a 61 yo female with COPD that presents to the clinic with SOB with exertion for last 2 weeks. 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 She feels like her chest is tight and wheezing often. She takes advair daily but admits to missing doses and proair as needed but has been out of proair. Denies any fever, chills, body aches, sinus pressure or ear pain. SOB worse with walking and bending over. She denies any abnormal swelling.    Review of Systems See HPI.     Objective:   Physical Exam  Constitutional: She is oriented to person, place, and time. She appears well-developed and well-nourished.  Morbid obesity.   HENT:  Head: Normocephalic and atraumatic.  Right Ear: External ear normal.  Left Ear: External ear normal.  Nose: Nose normal.  Mouth/Throat: Oropharynx is clear and moist. No oropharyngeal exudate.  Eyes: Conjunctivae are normal. Right eye exhibits no discharge. Left eye exhibits no discharge.  Neck: Normal range of motion. Neck supple.  Cardiovascular: Normal rate, regular rhythm and normal heart sounds.   Pulmonary/Chest: Effort normal and breath sounds normal.  Lymphadenopathy:    She has no cervical adenopathy.  Neurological: She is alert and oriented to person, place, and time.  Psychiatric: She has a normal mood and affect. Her behavior is normal.          Assessment & Plan:  Marland KitchenMarland KitchenDellanira was seen today for shortness of breath.  Diagnoses and all orders for this visit:  COPD exacerbation ([place]) -     predniSONE (DELTASONE) 20 MG tablet; Take 2 tablets daily for 5 days.  Other orders -     levocetirizine (XYZAL) 5 MG tablet; Take 1 tablet (5 mg total) by mouth every evening.  pulse resting 98 percent drops to 92 percent after 6 minutes of walking.  No signs of bacterial infection today. Lungs could certainly be inflamed due to  construction. Singulair was not working. Sent xyzal to try. Burst of prednisone given. Continue on maintaince inhalers. rescue inhaler as needed.

## 2016-12-09 DIAGNOSIS — F329 Major depressive disorder, single episode, unspecified: Secondary | ICD-10-CM | POA: Diagnosis not present

## 2016-12-09 DIAGNOSIS — Z794 Long term (current) use of insulin: Secondary | ICD-10-CM | POA: Diagnosis not present

## 2016-12-09 DIAGNOSIS — Z79891 Long term (current) use of opiate analgesic: Secondary | ICD-10-CM | POA: Diagnosis not present

## 2016-12-09 DIAGNOSIS — Z79899 Other long term (current) drug therapy: Secondary | ICD-10-CM | POA: Diagnosis not present

## 2016-12-09 DIAGNOSIS — M1990 Unspecified osteoarthritis, unspecified site: Secondary | ICD-10-CM | POA: Diagnosis not present

## 2016-12-09 DIAGNOSIS — Z9851 Tubal ligation status: Secondary | ICD-10-CM | POA: Diagnosis not present

## 2016-12-09 DIAGNOSIS — K5792 Diverticulitis of intestine, part unspecified, without perforation or abscess without bleeding: Secondary | ICD-10-CM | POA: Diagnosis not present

## 2016-12-09 DIAGNOSIS — Z881 Allergy status to other antibiotic agents status: Secondary | ICD-10-CM | POA: Diagnosis not present

## 2016-12-09 DIAGNOSIS — R509 Fever, unspecified: Secondary | ICD-10-CM | POA: Diagnosis not present

## 2016-12-09 DIAGNOSIS — Z888 Allergy status to other drugs, medicaments and biological substances status: Secondary | ICD-10-CM | POA: Diagnosis not present

## 2016-12-09 DIAGNOSIS — Z882 Allergy status to sulfonamides status: Secondary | ICD-10-CM | POA: Diagnosis not present

## 2016-12-09 DIAGNOSIS — J441 Chronic obstructive pulmonary disease with (acute) exacerbation: Secondary | ICD-10-CM | POA: Diagnosis not present

## 2016-12-09 DIAGNOSIS — E119 Type 2 diabetes mellitus without complications: Secondary | ICD-10-CM | POA: Diagnosis not present

## 2016-12-09 DIAGNOSIS — Z87891 Personal history of nicotine dependence: Secondary | ICD-10-CM | POA: Diagnosis not present

## 2016-12-09 DIAGNOSIS — F419 Anxiety disorder, unspecified: Secondary | ICD-10-CM | POA: Diagnosis not present

## 2016-12-09 DIAGNOSIS — R05 Cough: Secondary | ICD-10-CM | POA: Diagnosis not present

## 2016-12-09 DIAGNOSIS — I10 Essential (primary) hypertension: Secondary | ICD-10-CM | POA: Diagnosis not present

## 2016-12-09 DIAGNOSIS — E1140 Type 2 diabetes mellitus with diabetic neuropathy, unspecified: Secondary | ICD-10-CM | POA: Diagnosis not present

## 2016-12-14 ENCOUNTER — Other Ambulatory Visit: Payer: Self-pay | Admitting: Physician Assistant

## 2016-12-14 DIAGNOSIS — E118 Type 2 diabetes mellitus with unspecified complications: Secondary | ICD-10-CM

## 2016-12-14 DIAGNOSIS — Z794 Long term (current) use of insulin: Secondary | ICD-10-CM

## 2016-12-20 ENCOUNTER — Ambulatory Visit (INDEPENDENT_AMBULATORY_CARE_PROVIDER_SITE_OTHER): Payer: Medicare Other | Admitting: Physician Assistant

## 2016-12-20 ENCOUNTER — Ambulatory Visit: Payer: Self-pay

## 2016-12-20 ENCOUNTER — Encounter: Payer: Self-pay | Admitting: Physician Assistant

## 2016-12-20 VITALS — BP 145/95 | HR 105 | Ht 63.75 in | Wt 260.0 lb

## 2016-12-20 DIAGNOSIS — E118 Type 2 diabetes mellitus with unspecified complications: Secondary | ICD-10-CM | POA: Diagnosis not present

## 2016-12-20 DIAGNOSIS — R0600 Dyspnea, unspecified: Secondary | ICD-10-CM | POA: Diagnosis not present

## 2016-12-20 DIAGNOSIS — Z794 Long term (current) use of insulin: Secondary | ICD-10-CM | POA: Diagnosis not present

## 2016-12-20 DIAGNOSIS — I10 Essential (primary) hypertension: Secondary | ICD-10-CM

## 2016-12-20 DIAGNOSIS — R7989 Other specified abnormal findings of blood chemistry: Secondary | ICD-10-CM | POA: Diagnosis not present

## 2016-12-20 DIAGNOSIS — J441 Chronic obstructive pulmonary disease with (acute) exacerbation: Secondary | ICD-10-CM

## 2016-12-20 LAB — POCT GLYCOSYLATED HEMOGLOBIN (HGB A1C): Hemoglobin A1C: 7.3

## 2016-12-20 MED ORDER — METFORMIN HCL 1000 MG PO TABS: 1000.0000 mg | ORAL_TABLET | Freq: Two times a day (BID) | ORAL | 5 refills | Status: DC

## 2016-12-20 MED ORDER — IPRATROPIUM-ALBUTEROL 20-100 MCG/ACT IN AERS: 1.0000 | INHALATION_SPRAY | Freq: Four times a day (QID) | RESPIRATORY_TRACT | 2 refills | Status: DC

## 2016-12-20 MED ORDER — DOXYCYCLINE HYCLATE 100 MG PO TABS: 100.0000 mg | ORAL_TABLET | Freq: Two times a day (BID) | ORAL | 0 refills | Status: DC

## 2016-12-20 MED ORDER — LIRAGLUTIDE 18 MG/3ML ~~LOC~~ SOPN: PEN_INJECTOR | SUBCUTANEOUS | 2 refills | Status: DC

## 2016-12-20 MED ORDER — FUROSEMIDE 20 MG PO TABS: 20.0000 mg | ORAL_TABLET | Freq: Every day | ORAL | 3 refills | Status: DC

## 2016-12-20 NOTE — Progress Notes (Signed)
   Subjective:    Patient ID: [name] Rice, female    DOB: May 15, 1956, 61 y.o.   MRN: [id number]  HPI  Pt is a 61 yo female who presents to the clinic for DM follow up and SOB/Wheezing/cough.   DM- she is not checking sugars. She is taking long acting insulin and victoza most days. She has ongoing neuropathy of bilateral feet. She denies any open sores or wounds on legs. No hypoglycemic events.   She has been recently having numerous COPD exacerbations. They are doing renovation in her apartment and every time they paint she feels like she cannot breath. She has been seen for this 2/13, 2/23 and today. ED visit CBC normal limits. BNP was elevated. cXR showed vascular congestion/heart normal size. Improved after breathing treatment, prednisone and lasix. Lasix 40mg  is causing her to urinate all the time and would like it decreased. No noticed any unusual swelling. On BB.      Review of Systems  All other systems reviewed and are negative.      Objective:   Physical Exam  Constitutional: She is oriented to person, place, and time. She appears well-developed and well-nourished.  HENT:  Head: Normocephalic and atraumatic.  Cardiovascular: Normal rate, regular rhythm and normal heart sounds.   Pulmonary/Chest:  decreased effort.  No wheezing.  Coarse breath sounds.  Rhonchi bilaterally.   Neurological: She is alert and oriented to person, place, and time.  Psychiatric: She has a normal mood and affect. Her behavior is normal.          Assessment & Plan:  [name] Rice KitchenMakinna was seen today for diabetes, cough and hypertension.  Diagnoses and all orders for this visit:  Type 2 diabetes mellitus with complication, unspecified long term insulin use status ([place]) -     POCT HgB A1C  Essential hypertension  Type 2 diabetes mellitus with complication, with long-term current use of insulin (HCC) -     liraglutide (VICTOZA) 18 MG/3ML SOPN; inject 1.2 milligram subcutaneously daily  COPD  exacerbation ([place]) -     Ipratropium-Albuterol (COMBIVENT) 20-100 MCG/ACT AERS respimat; Inhale 1 puff into the lungs every 6 (six) hours. -     doxycycline (VIBRA-TABS) 100 MG tablet; Take 1 tablet (100 mg total) by mouth 2 (two) times daily. -     furosemide (LASIX) 20 MG tablet; Take 1 tablet (20 mg total) by mouth daily.  Elevated brain natriuretic peptide (BNP) level -     ECHOCARDIOGRAM COMPLETE; Future -     ECHOCARDIOGRAM COMPLETE  Dyspnea, unspecified type -     ECHOCARDIOGRAM COMPLETE; Future -     ECHOCARDIOGRAM COMPLETE  Other orders -     metFORMIN (GLUCOPHAGE) 1000 MG tablet; Take 1 tablet (1,000 mg total) by mouth 2 (two) times daily with a meal.   .. Lab Results  Component Value Date   HGBA1C 7.3 12/20/2016   A!C not too bad.  Work on daily compliance. Pt has had a lot of prednisone as well. Hopefully this will improve over next 3 months.   Treated for COPD exacerbation and discussed getting out of house during construction etc for prevention. Level of SOB and hospital elevated BNP will get echo for CHF evaluation. Decreased lasix to 20mg  as needed. On BB. BP much better on recheck. Pulse ox is stable.   a1c 7.3

## 2016-12-26 ENCOUNTER — Telehealth: Payer: Self-pay | Admitting: *Deleted

## 2016-12-26 NOTE — Telephone Encounter (Signed)
Pre Authorization sent to cover my meds. [id number]

## 2016-12-30 ENCOUNTER — Other Ambulatory Visit: Payer: Self-pay

## 2016-12-30 DIAGNOSIS — M542 Cervicalgia: Secondary | ICD-10-CM

## 2016-12-30 DIAGNOSIS — M545 Low back pain: Secondary | ICD-10-CM

## 2016-12-30 MED ORDER — HYDROCODONE-ACETAMINOPHEN 5-325 MG PO TABS: ORAL_TABLET | ORAL | 0 refills | Status: DC

## 2017-01-02 ENCOUNTER — Other Ambulatory Visit: Payer: Self-pay | Admitting: Physician Assistant

## 2017-01-02 NOTE — Telephone Encounter (Signed)
combivent has been approved. Message left on patients vm and pharm vm

## 2017-01-08 ENCOUNTER — Other Ambulatory Visit: Payer: Self-pay | Admitting: Physician Assistant

## 2017-01-10 ENCOUNTER — Telehealth: Payer: Self-pay | Admitting: *Deleted

## 2017-01-10 ENCOUNTER — Other Ambulatory Visit: Payer: Self-pay | Admitting: *Deleted

## 2017-01-10 DIAGNOSIS — J441 Chronic obstructive pulmonary disease with (acute) exacerbation: Secondary | ICD-10-CM

## 2017-01-10 MED ORDER — DOXYCYCLINE HYCLATE 100 MG PO TABS: 100.0000 mg | ORAL_TABLET | Freq: Two times a day (BID) | ORAL | 0 refills | Status: DC

## 2017-01-10 NOTE — Telephone Encounter (Signed)
Ok to send 5 more day at twice a day NRF.

## 2017-01-10 NOTE — Telephone Encounter (Signed)
Rx sent pt notified 

## 2017-01-10 NOTE — Telephone Encounter (Signed)
Pt left vm stating that the doxy worked great & she felt much better while taking it.  She has completed it but feels like her sinus issues have not completely resolved 100% yet.  She was wanting to know if you could send her something else in to help.  Please advise.

## 2017-01-18 ENCOUNTER — Ambulatory Visit (HOSPITAL_BASED_OUTPATIENT_CLINIC_OR_DEPARTMENT_OTHER): Admission: RE | Admit: 2017-01-18 | Payer: Medicare Other | Source: Ambulatory Visit

## 2017-01-21 ENCOUNTER — Other Ambulatory Visit: Payer: Self-pay | Admitting: Physician Assistant

## 2017-01-21 DIAGNOSIS — Z794 Long term (current) use of insulin: Principal | ICD-10-CM

## 2017-01-21 DIAGNOSIS — E1140 Type 2 diabetes mellitus with diabetic neuropathy, unspecified: Secondary | ICD-10-CM

## 2017-01-24 ENCOUNTER — Other Ambulatory Visit: Payer: Self-pay | Admitting: Physician Assistant

## 2017-01-25 ENCOUNTER — Other Ambulatory Visit: Payer: Self-pay | Admitting: Physician Assistant

## 2017-01-25 DIAGNOSIS — E1140 Type 2 diabetes mellitus with diabetic neuropathy, unspecified: Secondary | ICD-10-CM

## 2017-01-25 DIAGNOSIS — Z794 Long term (current) use of insulin: Principal | ICD-10-CM

## 2017-02-01 ENCOUNTER — Other Ambulatory Visit: Payer: Self-pay

## 2017-02-01 ENCOUNTER — Telehealth: Payer: Self-pay

## 2017-02-01 DIAGNOSIS — M545 Low back pain: Secondary | ICD-10-CM

## 2017-02-01 DIAGNOSIS — M542 Cervicalgia: Secondary | ICD-10-CM

## 2017-02-01 MED ORDER — HYDROCODONE-ACETAMINOPHEN 5-325 MG PO TABS: ORAL_TABLET | ORAL | 0 refills | Status: DC

## 2017-02-01 NOTE — Telephone Encounter (Signed)
Requested refill for hydrocodone, I printed and place in your box to sign tomorrow.  She will pick it up on Thursday.

## 2017-02-02 ENCOUNTER — Ambulatory Visit (HOSPITAL_BASED_OUTPATIENT_CLINIC_OR_DEPARTMENT_OTHER)
Admission: RE | Admit: 2017-02-02 | Discharge: 2017-02-02 | Disposition: A | Discharge status: Home / Self Care | Payer: Medicare Other | Source: Ambulatory Visit | Attending: Physician Assistant | Admitting: Physician Assistant

## 2017-02-02 DIAGNOSIS — I348 Other nonrheumatic mitral valve disorders: Secondary | ICD-10-CM | POA: Diagnosis not present

## 2017-02-02 DIAGNOSIS — R0600 Dyspnea, unspecified: Secondary | ICD-10-CM | POA: Diagnosis not present

## 2017-02-02 DIAGNOSIS — R7989 Other specified abnormal findings of blood chemistry: Secondary | ICD-10-CM | POA: Diagnosis not present

## 2017-02-02 DIAGNOSIS — I5189 Other ill-defined heart diseases: Secondary | ICD-10-CM | POA: Insufficient documentation

## 2017-02-02 NOTE — Progress Notes (Signed)
Call pt: no significant changes in echo from previous exam.

## 2017-02-02 NOTE — Progress Notes (Signed)
  Echocardiogram 2D Echocardiogram has been performed.  [name] Rice M 02/02/2017, 9:18 AM

## 2017-02-16 DIAGNOSIS — G629 Polyneuropathy, unspecified: Secondary | ICD-10-CM | POA: Diagnosis not present

## 2017-02-22 ENCOUNTER — Other Ambulatory Visit: Payer: Self-pay | Admitting: *Deleted

## 2017-02-22 DIAGNOSIS — H9201 Otalgia, right ear: Secondary | ICD-10-CM

## 2017-02-22 MED ORDER — NEOMYCIN-POLYMYXIN-HC 3.5-10000-1 OT SOLN: 3.0000 [drp] | Freq: Four times a day (QID) | OTIC | 1 refills | Status: AC | PRN

## 2017-03-02 ENCOUNTER — Other Ambulatory Visit: Payer: Self-pay

## 2017-03-02 DIAGNOSIS — M542 Cervicalgia: Secondary | ICD-10-CM

## 2017-03-02 DIAGNOSIS — M545 Low back pain: Secondary | ICD-10-CM

## 2017-03-02 MED ORDER — HYDROCODONE-ACETAMINOPHEN 5-325 MG PO TABS: ORAL_TABLET | ORAL | 0 refills | Status: DC

## 2017-03-18 ENCOUNTER — Other Ambulatory Visit: Payer: Self-pay | Admitting: Physician Assistant

## 2017-03-18 DIAGNOSIS — E1142 Type 2 diabetes mellitus with diabetic polyneuropathy: Secondary | ICD-10-CM

## 2017-03-20 ENCOUNTER — Other Ambulatory Visit: Payer: Self-pay | Admitting: Physician Assistant

## 2017-03-22 ENCOUNTER — Ambulatory Visit: Payer: Self-pay | Admitting: Physician Assistant

## 2017-03-31 ENCOUNTER — Encounter: Payer: Self-pay | Admitting: Physician Assistant

## 2017-03-31 ENCOUNTER — Ambulatory Visit (INDEPENDENT_AMBULATORY_CARE_PROVIDER_SITE_OTHER): Payer: Medicare Other | Admitting: Physician Assistant

## 2017-03-31 VITALS — BP 118/66 | HR 70 | Wt 264.0 lb

## 2017-03-31 DIAGNOSIS — M545 Low back pain: Secondary | ICD-10-CM | POA: Diagnosis not present

## 2017-03-31 DIAGNOSIS — I10 Essential (primary) hypertension: Secondary | ICD-10-CM

## 2017-03-31 DIAGNOSIS — M542 Cervicalgia: Secondary | ICD-10-CM | POA: Diagnosis not present

## 2017-03-31 DIAGNOSIS — G894 Chronic pain syndrome: Secondary | ICD-10-CM | POA: Diagnosis not present

## 2017-03-31 DIAGNOSIS — E118 Type 2 diabetes mellitus with unspecified complications: Secondary | ICD-10-CM | POA: Diagnosis not present

## 2017-03-31 DIAGNOSIS — E785 Hyperlipidemia, unspecified: Secondary | ICD-10-CM | POA: Diagnosis not present

## 2017-03-31 DIAGNOSIS — G629 Polyneuropathy, unspecified: Secondary | ICD-10-CM | POA: Diagnosis not present

## 2017-03-31 DIAGNOSIS — E1169 Type 2 diabetes mellitus with other specified complication: Secondary | ICD-10-CM

## 2017-03-31 MED ORDER — HYDROCODONE-ACETAMINOPHEN 5-325 MG PO TABS: ORAL_TABLET | ORAL | 0 refills | Status: DC

## 2017-03-31 MED ORDER — AMBULATORY NON FORMULARY MEDICATION: 3 refills | Status: DC

## 2017-03-31 NOTE — Progress Notes (Signed)
Subjective:    Patient ID: [name] Rice, female    DOB: 1956/01/30, 61 y.o.   MRN: [id number]  HPI  Pt is a 61 yo female who presents to the clinic for DM 3 month follow up.   DM- not checking sugars. She is taking her medications as directed. She declines any hypoglycemia. She has no feeling in either foot. She has been to neurology and EMG showed. sensoimotor polyneuropathy.     Pt needs to get refill on norco for low back pain.    Active Ambulatory Problems    Diagnosis Date Noted  . Type II or unspecified type diabetes mellitus with neurological manifestations, not stated as uncontrolled(250.60) 08/08/2006  . DYSLIPIDEMIA 08/08/2006  . Depression with anxiety 01/28/2008  . Essential hypertension 08/08/2006  . COPD 08/08/2006  . GERD 08/08/2006  . Acute bilateral low back pain 11/27/2007  . BPPV (benign paroxysmal positional vertigo), unspecified laterality 07/14/2009  . OSA on CPAP   . Insomnia 02/06/2011  . Preventative health care 08/16/2011  . History of allergic rhinitis 09/07/2011  . Iron deficiency anemia 03/29/2012  . Lipoma of abdominal wall 05/01/2012  . Intestinal bacterial overgrowth 09/20/2012  . DOE (dyspnea on exertion) 03/31/2013  . Hepatic steatosis 09/26/2013  . Diverticulitis 11/27/2013  . Diabetic peripheral neuropathy ([place]) 05/30/2014  . Gastroparesis due to secondary diabetes ([place]) 09/16/2014  . Ear pain 12/09/2014  . COPD (chronic obstructive pulmonary disease) with chronic bronchitis ([place]) 12/09/2014  . Type 2 diabetes mellitus with complication ([place]) 67/67/2094  . Diarrhea 01/12/2015  . Non-proliferative diabetic retinopathy, right eye ([place]) 11/23/2015  . Chronic pain syndrome 12/15/2015  . Morbid obesity due to excess calories ([place]) 02/16/2016  . Right shoulder pain 02/16/2016  . Unstable balance 02/16/2016  . Intertrigo 03/15/2016  . Left knee pain 03/15/2016  . Post-menopausal 03/16/2016  . Fall 03/16/2016  . Acute reaction to stress  03/28/2016  . Dystonia 09/21/2016   Resolved Ambulatory Problems    Diagnosis Date Noted  . IRON DEFICIENCY 01/30/2007  . DEPRESSION 08/08/2006  . TORTICOLLIS, SPASMODIC 02/13/2007  . SINUSITIS, ACUTE 05/27/2009  . ALLERGIC RHINITIS 12/25/2006  . ASTHMA 01/28/2008  . DIVERTICULOSIS, COLON 02/12/2003  . DERMATITIS, SEBORRHEIC 01/30/2007  . GOUT, HX OF 08/08/2006  . PERSONAL HX COLONIC POLYPS 09/16/2008  . NEPHROLITHIASIS, HX OF 01/28/2008  . Calf pain 11/29/2010  . Tremor, essential 02/06/2011  . Seborrheic dermatitis, unspecified 04/06/2011  . Finger pain 04/06/2011  . Shoulder pain, right 08/16/2011  . Left leg pain 04/10/2012  . Right ear pain 05/01/2012  . Seborrheic dermatitis 05/01/2012  . Hypoglycemia 05/01/2012  . Vaginal candidiasis 08/22/2012  . Sigmoid diverticulitis 09/15/2012  . Acute-on-chronic kidney disease Stage II 09/15/2012  . Cyst, breast 03/01/2013  . Lactic acidosis 03/31/2013  . Acute renal insufficiency 03/31/2013  . Abdominal pain, left lower quadrant 04/29/2013  . Gout 05/01/2013   Past Medical History:  Diagnosis Date  . Anxiety   . Arthritis   . Asthma   . Cervical dystonia   . COPD (chronic obstructive pulmonary disease) ([place])   . Depression   . Diabetes mellitus   . Diarrhea   . Diverticulitis   . Diverticulosis   . Dizziness   . Gastroparesis   . GERD (gastroesophageal reflux disease)   . History of colonic polyps   . History of kidney stones   . History of seborrheic dermatitis   . HLD (hyperlipidemia)   . Hypertension   . Intussusception ([place])   .  Lumbar pain 2011  . Morbid obesity ([place])   . Nephrolithiasis   . OSA on CPAP 2008  . Pneumonia   . Recurrent boils   . Sleep apnea   . Torn rotator cuff RT SHOULDER  . Tremor [place]         Review of Systems  All other systems reviewed and are negative.      Objective:   Physical Exam  Constitutional: She is oriented to person, place, and time. She  appears well-developed and well-nourished.  HENT:  Head: Normocephalic and atraumatic.  Cardiovascular: Normal rate, regular rhythm and normal heart sounds.   Pulmonary/Chest: Effort normal and breath sounds normal.  Neurological: She is alert and oriented to person, place, and time.  Psychiatric: She has a normal mood and affect. Her behavior is normal.          Assessment & Plan:   [name] KitchenMarland Rice was seen today for diabetes.  Diagnoses and all orders for this visit:  Type 2 diabetes mellitus with complication, unspecified whether long term insulin use ([place]) -     AMBULATORY NON FORMULARY MEDICATION; Test strips and needles for one touch Verio IQ glucometer.  Use to test blood sugar up to four times a day. Diagnosis Type 2 Diabetes -     POCT HgB A1C  Essential hypertension  Neck pain -     Discontinue: HYDROcodone-acetaminophen (NORCO/VICODIN) 5-325 MG tablet; TAKE 1 TO 2 TABLETS BY MOUTH TWICE DAILY. -     HYDROcodone-acetaminophen (NORCO/VICODIN) 5-325 MG tablet; TAKE 1 TO 2 TABLETS BY MOUTH TWICE DAILY.  Acute bilateral low back pain, with sciatica presence unspecified -     Discontinue: HYDROcodone-acetaminophen (NORCO/VICODIN) 5-325 MG tablet; TAKE 1 TO 2 TABLETS BY MOUTH TWICE DAILY. -     HYDROcodone-acetaminophen (NORCO/VICODIN) 5-325 MG tablet; TAKE 1 TO 2 TABLETS BY MOUTH TWICE DAILY.  Chronic pain syndrome -     HYDROcodone-acetaminophen (NORCO/VICODIN) 5-325 MG tablet; TAKE 1 TO 2 TABLETS BY MOUTH TWICE DAILY.  Polyneuropathy  Hyperlipidemia associated with type 2 diabetes mellitus (HCC) -     Lipid panel -     COMPLETE METABOLIC PANEL WITH GFR  Morbid obesity (HCC)     a1c is 7.0 down from 7.3.  On STATIN.  BP controlled.  Encouraged eye exam.  Follow up in 3 months.  .. Diabetic Foot Exam - Simple   Simple Foot Form Diabetic Foot exam was performed with the following findings:  Yes 03/31/2017  2:09 PM  Visual Inspection No deformities, no ulcerations,  no other skin breakdown bilaterally:  Yes Sensation Testing See comments:  Yes Pulse Check Comments Patient had no feeling on either foot on any of the sensory locations    Pt is on gabapentin for neuropathy.   Pt is on pain contract.  Low risk opioid assessment.  Refilled norco for 2 months.

## 2017-04-02 ENCOUNTER — Telehealth: Payer: Self-pay | Admitting: Physician Assistant

## 2017-04-02 NOTE — Telephone Encounter (Signed)
Please record a1c of 7 in chart.

## 2017-04-06 DIAGNOSIS — E1169 Type 2 diabetes mellitus with other specified complication: Secondary | ICD-10-CM | POA: Diagnosis not present

## 2017-04-06 DIAGNOSIS — E119 Type 2 diabetes mellitus without complications: Secondary | ICD-10-CM | POA: Diagnosis not present

## 2017-04-06 DIAGNOSIS — H2513 Age-related nuclear cataract, bilateral: Secondary | ICD-10-CM | POA: Diagnosis not present

## 2017-04-06 DIAGNOSIS — E785 Hyperlipidemia, unspecified: Secondary | ICD-10-CM | POA: Diagnosis not present

## 2017-04-06 LAB — COMPLETE METABOLIC PANEL WITH GFR
ALBUMIN: 3.9 g/dL (ref 3.6–5.1)
ALK PHOS: 76 U/L (ref 33–130)
ALT: 9 U/L (ref 6–29)
AST: 14 U/L (ref 10–35)
BUN: 16 mg/dL (ref 7–25)
CALCIUM: 9.5 mg/dL (ref 8.6–10.4)
CO2: 28 mmol/L (ref 20–31)
Chloride: 99 mmol/L (ref 98–110)
Creat: 0.98 mg/dL (ref 0.50–0.99)
GFR, EST NON AFRICAN AMERICAN: 62 mL/min (ref >=60)
GFR, Est African American: 71 mL/min (ref >=60)
Glucose, Bld: 130 mg/dL — ABNORMAL HIGH (ref 65–99)
Potassium: 4 mmol/L (ref 3.5–5.3)
Sodium: 139 mmol/L (ref 135–146)
Total Bilirubin: 0.5 mg/dL (ref 0.2–1.2)
Total Protein: 7.3 g/dL (ref 6.1–8.1)

## 2017-04-06 LAB — HM DIABETES EYE EXAM

## 2017-04-06 LAB — LIPID PANEL
CHOL/HDL RATIO: 3.4 ratio (ref <5.0)
CHOLESTEROL: 150 mg/dL (ref <200)
HDL: 44 mg/dL — AB (ref >50)
LDL Cholesterol: 76 mg/dL (ref <100)
Triglycerides: 151 mg/dL — ABNORMAL HIGH (ref <150)
VLDL: 30 mg/dL (ref <30)

## 2017-05-01 ENCOUNTER — Encounter: Payer: Self-pay | Admitting: Physician Assistant

## 2017-05-08 ENCOUNTER — Ambulatory Visit (INDEPENDENT_AMBULATORY_CARE_PROVIDER_SITE_OTHER): Payer: Medicare Other | Admitting: Family Medicine

## 2017-05-08 ENCOUNTER — Encounter: Payer: Self-pay | Admitting: Family Medicine

## 2017-05-08 VITALS — BP 118/76 | HR 70 | Temp 97.8°F | Wt 265.0 lb

## 2017-05-08 DIAGNOSIS — H8110 Benign paroxysmal vertigo, unspecified ear: Secondary | ICD-10-CM

## 2017-05-08 DIAGNOSIS — E118 Type 2 diabetes mellitus with unspecified complications: Secondary | ICD-10-CM

## 2017-05-08 DIAGNOSIS — J449 Chronic obstructive pulmonary disease, unspecified: Secondary | ICD-10-CM

## 2017-05-08 DIAGNOSIS — J4489 Other specified chronic obstructive pulmonary disease: Secondary | ICD-10-CM

## 2017-05-08 DIAGNOSIS — Z794 Long term (current) use of insulin: Secondary | ICD-10-CM | POA: Diagnosis not present

## 2017-05-08 MED ORDER — PREDNISONE 10 MG PO TABS: 30.0000 mg | ORAL_TABLET | Freq: Every day | ORAL | 0 refills | Status: DC

## 2017-05-08 MED ORDER — LIRAGLUTIDE 18 MG/3ML ~~LOC~~ SOPN: PEN_INJECTOR | SUBCUTANEOUS | 2 refills | Status: DC

## 2017-05-08 MED ORDER — MECLIZINE HCL 25 MG PO TABS: 25.0000 mg | ORAL_TABLET | Freq: Three times a day (TID) | ORAL | 0 refills | Status: DC | PRN

## 2017-05-08 MED ORDER — AZITHROMYCIN 250 MG PO TABS: 250.0000 mg | ORAL_TABLET | Freq: Every day | ORAL | 0 refills | Status: DC

## 2017-05-08 MED ORDER — METRONIDAZOLE 250 MG PO TABS: 250.0000 mg | ORAL_TABLET | Freq: Three times a day (TID) | ORAL | 0 refills | Status: DC

## 2017-05-08 NOTE — Patient Instructions (Signed)
Thank you for coming in today. Take prednisone and azithromycin  Recheck as needed.  You should hear from [facility] for BPPV soon.   Recheck as needed.      Chronic Obstructive Pulmonary Disease Exacerbation Chronic obstructive pulmonary disease (COPD) is a common lung condition in which airflow from the lungs is limited. COPD is a general term that can be used to describe many different lung problems that limit airflow, including chronic bronchitis and emphysema. COPD exacerbations are episodes when breathing symptoms become much worse and require extra treatment. Without treatment, COPD exacerbations can be life threatening, and frequent COPD exacerbations can cause further damage to your lungs. What are the causes?  Respiratory infections.  Exposure to smoke.  Exposure to air pollution, chemical fumes, or dust. Sometimes there is no apparent cause or trigger. What increases the risk?  Smoking cigarettes.  Older age.  Frequent prior COPD exacerbations. What are the signs or symptoms?  Increased coughing.  Increased thick spit (sputum) production.  Increased wheezing.  Increased shortness of breath.  Rapid breathing.  Chest tightness. How is this diagnosed? Your medical history, a physical exam, and tests will help your health care provider make a diagnosis. Tests may include:  A chest X-ray.  Basic lab tests.  Sputum testing.  An arterial blood gas test.  How is this treated? Depending on the severity of your COPD exacerbation, you may need to be admitted to a hospital for treatment. Some of the treatments commonly used to treat COPD exacerbations are:  Antibiotic medicines.  Bronchodilators. These are drugs that expand the air passages. They may be given with an inhaler or nebulizer. Spacer devices may be needed to help improve drug delivery.  Corticosteroid medicines.  Supplemental oxygen therapy.  Airway clearing techniques, such as noninvasive  ventilation (NIV) and positive expiratory pressure (PEP). These provide respiratory support through a mask or other noninvasive device.  Follow these instructions at home:  Do not smoke. Quitting smoking is very important to prevent COPD from getting worse and exacerbations from happening as often.  Avoid exposure to all substances that irritate the airway, especially to tobacco smoke.  If you were prescribed an antibiotic medicine, finish it all even if you start to feel better.  Take all medicines as directed by your health care provider.It is important to use correct technique with inhaled medicines.  Drink enough fluids to keep your urine clear or pale yellow (unless you have a medical condition that requires fluid restriction).  Use a cool mist vaporizer. This makes it easier to clear your chest when you cough.  If you have a home nebulizer and oxygen, continue to use them as directed.  Maintain all necessary vaccinations to prevent infections.  Exercise regularly.  Eat a healthy diet.  Keep all follow-up appointments as directed by your health care provider. Get help right away if:  You have worsening shortness of breath.  You have trouble talking.  You have severe chest pain.  You have blood in your sputum.  You have a fever.  You have weakness, vomit repeatedly, or faint.  You feel confused.  You continue to get worse. This information is not intended to replace advice given to you by your health care provider. Make sure you discuss any questions you have with your health care provider. Document Released: 07/31/2007 Document Revised: 03/10/2016 Document Reviewed: 06/07/2013 Elsevier Interactive Patient Education  2017 [employer].

## 2017-05-08 NOTE — Progress Notes (Signed)
[name] Rice is a 61 y.o. female who presents to [facility] today for sinus congestion and wheezing shortness of breath and cough. Symptoms present for one week and are consistent with sinusitis and COPD exacerbation. She has used her albuterol inhaler which does help a bit. She has not tried any other treatment yet. She denies vomiting or diarrhea. She denies new chest pain or palpitations.  She does also note worsening vertigo. She's been diagnosed with paroxysmal positional vertigo in the past and notes that she has run out of meclizine. She would like a refill. She notes that she has trouble traveling to physical therapy appointments and has not been able to go to vestibular physical therapy previously.   Past Medical History:  Diagnosis Date  . Anxiety   . Arthritis   . Asthma    no PFTs  . Cervical dystonia   . COPD (chronic obstructive pulmonary disease) (HCC)    no PFTs  . Depression   . Diabetes mellitus   . Diarrhea   . Diverticulitis   . Diverticulosis   . Dizziness   . Gastroparesis   . GERD (gastroesophageal reflux disease)   . History of colonic polyps    hyperplastic  . History of kidney stones   . History of seborrheic dermatitis    face,ears  . HLD (hyperlipidemia)   . Hypertension   . Intussusception ([place])   . Lumbar pain 2011   per x-ray - Multilevel spondylosis  . Morbid obesity ([place])   . Nephrolithiasis   . OSA on CPAP 2008  . Pneumonia   . Recurrent boils    "SLUSTER BOILS" ON ABD AND LABIA  . Sleep apnea    USES C-PAP  . Torn rotator cuff RT SHOULDER  . Tremor CCENTRAL NERVOUS SYSTEM   Past Surgical History:  Procedure Laterality Date  . CARPAL TUNNEL RELEASE Left 2006  . ELBOW SURGERY Left   . HAND SURGERY Right RIGHT  . JOINT REPLACEMENT Right    shoulder  . KNEE ARTHROSCOPY Left   . LUMBAR LAMINECTOMY    . MASS  EXCISION  06/28/2012   Procedure: EXCISION MASS;  Surgeon: Harl Bowie, MD;  Location: [facility];  Service: General;  Laterality: Left;  excision left upper quadrant abdominal wall mass, Benign Lipoma  . NASAL SINUS SURGERY    . PILONIDAL CYST EXCISION  [id number]  . TUBAL LIGATION     Social History  Substance Use Topics  . Smoking status: Former Smoker    Types: Cigarettes    Quit date: 10/17/1996  . Smokeless tobacco: Never Used  . Alcohol use No   family history includes Depression in her brother, brother, and sister; Diabetes in her brother, brother, mother, and sister; Heart attack in her brother and brother; Heart disease in her brother; Hyperlipidemia in her brother, brother, mother, and sister; Hypertension in her brother, brother, mother, and sister; Kidney cancer in her brother; Kidney disease in her mother; Stroke in her sister.  ROS as above:  Medications: Current Outpatient Prescriptions  Medication Sig Dispense Refill  . ADVAIR DISKUS 250-50 MCG/DOSE AEPB inhale 1 puff by mouth twice a day 60 each 11  . AMBULATORY NON FORMULARY MEDICATION Diabetic shoes  Dx: Diabetes, uncontrolled 250.60 with neuropathy. 1 Device 0  . AMBULATORY NON FORMULARY MEDICATION Test strips and needles for one touch Verio IQ glucometer.  Use to test blood sugar up to four times  a day. Diagnosis Type 2 Diabetes 100 each 3  . azelastine (ASTELIN) 0.1 % nasal spray Place 2 sprays into both nostrils 2 (two) times daily. Use in each nostril as directed 30 mL 6  . B-D INS SYR ULTRAFINE 1CC/30G 30G X 1/2" 1 ML MISC use as directed three times a day 100 each 5  . DULoxetine (CYMBALTA) 60 MG capsule take 2 capsules by mouth once daily 60 capsule 2  . furosemide (LASIX) 20 MG tablet Take 1 tablet (20 mg total) by mouth daily. 30 tablet 3  . gabapentin (NEURONTIN) 800 MG tablet take 1 tablet by mouth three times a day 90 tablet 5  . glucose blood (ONETOUCH VERIO) test strip Check blood sugar 3 times daily.  Diagnosis: Type II diabetes mellitus with neurological manifestations (E11.49) 100 each 3  . HYDROcodone-acetaminophen (NORCO/VICODIN) 5-325 MG tablet TAKE 1 TO 2 TABLETS BY MOUTH TWICE DAILY. 60 tablet 0  . insulin glargine (LANTUS) 100 UNIT/ML injection Inject 0.4 mLs (40 Units total) into the skin at bedtime. 90 vial 0  . levocetirizine (XYZAL) 5 MG tablet Take 1 tablet (5 mg total) by mouth every evening. 30 tablet 5  . liraglutide (VICTOZA) 18 MG/3ML SOPN inject 1.8 milligram subcutaneously daily 6 mL 2  . LORazepam (ATIVAN) 0.5 MG tablet Take 1 tablet (0.5 mg total) by mouth every 8 (eight) hours as needed for anxiety. 90 tablet 1  . meclizine (ANTIVERT) 25 MG tablet Take 1 tablet (25 mg total) by mouth 3 (three) times daily as needed for dizziness. 30 tablet 0  . meloxicam (MOBIC) 15 MG tablet take 1 tablet by mouth once daily 30 tablet 1  . metFORMIN (GLUCOPHAGE) 1000 MG tablet Take 1 tablet (1,000 mg total) by mouth 2 (two) times daily with a meal. 60 tablet 5  . methocarbamol (ROBAXIN) 500 MG tablet take 1 tablet by mouth three times a day 270 tablet 0  . metroNIDAZOLE (FLAGYL) 250 MG tablet Take 1 tablet (250 mg total) by mouth 3 (three) times daily. 30 tablet 0  . NOVOFINE 32G X 6 MM MISC USE 3 TIMES A DAY WITH NOVOLOG 100 each 2  . NYAMYC powder apply to affected area three times a day 60 g 1  . nystatin ointment (MYCOSTATIN) apply to affected area twice a day 60 g 0  . PATADAY 0.2 % SOLN instill 1 drop into both eyes twice a day if needed 2.5 mL 3  . pioglitazone (ACTOS) 30 MG tablet Take 1 tablet (30 mg total) by mouth daily. 30 tablet 5  . PROAIR HFA 108 (90 Base) MCG/ACT inhaler inhale 2 puffs by mouth every 6 hours if needed FOR WHEEZING OR SHORTNESS OF BREATH 8.5 g 11  . propranolol (INDERAL) 20 MG tablet take 1 tablet by mouth twice a day 60 tablet 5  . azithromycin (ZITHROMAX) 250 MG tablet Take 1 tablet (250 mg total) by mouth daily. Take first 2 tablets together, then 1 every  day until finished. 6 tablet 0  . predniSONE (DELTASONE) 10 MG tablet Take 3 tablets (30 mg total) by mouth daily with breakfast. 15 tablet 0   No current facility-administered medications for this visit.    Allergies  Allergen Reactions  . Ace Inhibitors Other (See Comments)    Causes blood pressure to drop rapidly  . Cefaclor Hives and Itching  . Shrimp [Shellfish Allergy] Nausea And Vomiting  . Ceclor [Cefaclor]   . Glyxambi [Empagliflozin-Linagliptin]     Itching/rash  . Sulfa  Antibiotics   . Sulfonamide Derivatives Rash    Health Maintenance Health Maintenance  Topic Date Due  . Hepatitis C Screening  1955/12/22  . URINE MICROALBUMIN  12/14/2016  . HEMOGLOBIN A1C  03/22/2017  . PNEUMOCOCCAL POLYSACCHARIDE VACCINE (2) 11/17/2017 (Originally 02/11/2014)  . MAMMOGRAM  11/17/2017 (Originally 03/26/2016)  . HIV Screening  04/02/2018 (Originally 02/22/1971)  . INFLUENZA VACCINE  05/17/2017  . FOOT EXAM  03/31/2018  . LIPID PANEL  04/06/2018  . OPHTHALMOLOGY EXAM  04/06/2018  . COLONOSCOPY  10/29/2018  . PAP SMEAR  03/16/2019  . TETANUS/TDAP  04/03/2021     Exam:  BP 118/76   Pulse 70   Temp 97.8 F (36.6 C)   Wt 265 lb (120.2 kg)   LMP 10/18/1995   SpO2 96%   BMI 45.84 kg/m  Gen: Well NAD HEENT: EOMI,  MMM Normal Tympanic membranes bilaterally. Clear nasal discharge. Posterior pharynx with cobblestoning. Lungs: Normal work of breathing. Prolonged expiratory phase with wheezing and coarse breath sounds bilaterally. Heart: RRR no MRG Abd: NABS, Soft. Nondistended, Nontender Exts: Brisk capillary refill, warm and well perfused.    No results found for this or any previous visit (from the past 72 hour(s)). No results found.    Assessment and Plan: 61 y.o. female with  COPD exacerbation with potential mild viral sinusitis. Treatment with prednisone course and azithromycin. Continued use of albuterol.  BPPV: Worsening today. Plan to refill meclizine and refer to  home health vestibular physical therapy as patient has trouble traveling to appointments and is nearly homebound.   Orders Placed This Encounter  Procedures  . Ambulatory referral to Home Health    Referral Priority:   Routine    Referral Type:   Home Health Care    Referral Reason:   Specialty Services Required    Requested Specialty:   [place]    Number of Visits Requested:   1   Meds ordered this encounter  Medications  . meclizine (ANTIVERT) 25 MG tablet    Sig: Take 1 tablet (25 mg total) by mouth 3 (three) times daily as needed for dizziness.    Dispense:  30 tablet    Refill:  0  . DISCONTD: liraglutide (VICTOZA) 18 MG/3ML SOPN    Sig: inject 1.2 milligram subcutaneously daily    Dispense:  6 mL    Refill:  2  . metroNIDAZOLE (FLAGYL) 250 MG tablet    Sig: Take 1 tablet (250 mg total) by mouth 3 (three) times daily.    Dispense:  30 tablet    Refill:  0  . predniSONE (DELTASONE) 10 MG tablet    Sig: Take 3 tablets (30 mg total) by mouth daily with breakfast.    Dispense:  15 tablet    Refill:  0  . liraglutide (VICTOZA) 18 MG/3ML SOPN    Sig: inject 1.8 milligram subcutaneously daily    Dispense:  6 mL    Refill:  2  . azithromycin (ZITHROMAX) 250 MG tablet    Sig: Take 1 tablet (250 mg total) by mouth daily. Take first 2 tablets together, then 1 every day until finished.    Dispense:  6 tablet    Refill:  0     Discussed warning signs or symptoms. Please see discharge instructions. Patient expresses understanding.

## 2017-05-10 DIAGNOSIS — Z794 Long term (current) use of insulin: Secondary | ICD-10-CM | POA: Diagnosis not present

## 2017-05-10 DIAGNOSIS — I10 Essential (primary) hypertension: Secondary | ICD-10-CM | POA: Diagnosis not present

## 2017-05-10 DIAGNOSIS — J449 Chronic obstructive pulmonary disease, unspecified: Secondary | ICD-10-CM | POA: Diagnosis not present

## 2017-05-10 DIAGNOSIS — R262 Difficulty in walking, not elsewhere classified: Secondary | ICD-10-CM | POA: Diagnosis not present

## 2017-05-10 DIAGNOSIS — J329 Chronic sinusitis, unspecified: Secondary | ICD-10-CM | POA: Diagnosis not present

## 2017-05-10 DIAGNOSIS — E119 Type 2 diabetes mellitus without complications: Secondary | ICD-10-CM | POA: Diagnosis not present

## 2017-05-10 DIAGNOSIS — H8110 Benign paroxysmal vertigo, unspecified ear: Secondary | ICD-10-CM | POA: Diagnosis not present

## 2017-05-10 DIAGNOSIS — Z87891 Personal history of nicotine dependence: Secondary | ICD-10-CM | POA: Diagnosis not present

## 2017-05-10 DIAGNOSIS — M25511 Pain in right shoulder: Secondary | ICD-10-CM | POA: Diagnosis not present

## 2017-05-11 ENCOUNTER — Telehealth: Payer: Self-pay | Admitting: *Deleted

## 2017-05-11 NOTE — Telephone Encounter (Signed)
Called to get verbal orders for continuation for OT an PT .Audelia Hives [place]

## 2017-06-07 ENCOUNTER — Other Ambulatory Visit: Payer: Self-pay | Admitting: Physician Assistant

## 2017-06-07 DIAGNOSIS — M545 Low back pain: Secondary | ICD-10-CM

## 2017-06-07 DIAGNOSIS — M542 Cervicalgia: Secondary | ICD-10-CM

## 2017-06-07 DIAGNOSIS — G894 Chronic pain syndrome: Secondary | ICD-10-CM

## 2017-06-07 MED ORDER — HYDROCODONE-ACETAMINOPHEN 5-325 MG PO TABS: ORAL_TABLET | ORAL | 0 refills | Status: DC

## 2017-06-07 NOTE — Telephone Encounter (Signed)
Pt needs an updated pain contract and needs appt. Will give one more month but make appt within the month.

## 2017-06-12 NOTE — Telephone Encounter (Signed)
Pt has picked up Rx, has f/u with PCP on 06/30/17.

## 2017-06-13 ENCOUNTER — Other Ambulatory Visit: Payer: Self-pay | Admitting: Physician Assistant

## 2017-06-13 DIAGNOSIS — J441 Chronic obstructive pulmonary disease with (acute) exacerbation: Secondary | ICD-10-CM

## 2017-06-15 ENCOUNTER — Telehealth: Payer: Self-pay | Admitting: Physician Assistant

## 2017-06-15 NOTE — Telephone Encounter (Signed)
Pt called. She states since her DM medication dosage has changed  to 40mg   the Pen would be perfect to use instead of vials.  Thank you.

## 2017-06-16 ENCOUNTER — Other Ambulatory Visit: Payer: Self-pay | Admitting: Physician Assistant

## 2017-06-16 MED ORDER — INSULIN GLARGINE 100 UNIT/ML SOLOSTAR PEN: 40.0000 [IU] | PEN_INJECTOR | Freq: Every day | SUBCUTANEOUS | 2 refills | Status: DC

## 2017-06-16 NOTE — Telephone Encounter (Signed)
Sent solostar pen.

## 2017-06-20 ENCOUNTER — Other Ambulatory Visit: Payer: Self-pay | Admitting: Physician Assistant

## 2017-06-20 DIAGNOSIS — Z794 Long term (current) use of insulin: Secondary | ICD-10-CM

## 2017-06-20 DIAGNOSIS — E118 Type 2 diabetes mellitus with unspecified complications: Secondary | ICD-10-CM

## 2017-06-30 ENCOUNTER — Ambulatory Visit: Payer: Self-pay | Admitting: Physician Assistant

## 2017-07-03 ENCOUNTER — Ambulatory Visit: Payer: Self-pay | Admitting: Physician Assistant

## 2017-07-05 ENCOUNTER — Ambulatory Visit: Payer: Self-pay | Admitting: Physician Assistant

## 2017-07-05 DIAGNOSIS — Z0189 Encounter for other specified special examinations: Secondary | ICD-10-CM

## 2017-07-11 ENCOUNTER — Encounter: Payer: Self-pay | Admitting: Physician Assistant

## 2017-07-11 ENCOUNTER — Ambulatory Visit (INDEPENDENT_AMBULATORY_CARE_PROVIDER_SITE_OTHER): Payer: Medicare HMO | Admitting: Physician Assistant

## 2017-07-11 ENCOUNTER — Ambulatory Visit (INDEPENDENT_AMBULATORY_CARE_PROVIDER_SITE_OTHER): Payer: Medicare HMO

## 2017-07-11 VITALS — BP 133/49 | HR 70 | Ht 63.75 in

## 2017-07-11 DIAGNOSIS — M542 Cervicalgia: Secondary | ICD-10-CM

## 2017-07-11 DIAGNOSIS — E118 Type 2 diabetes mellitus with unspecified complications: Secondary | ICD-10-CM | POA: Diagnosis not present

## 2017-07-11 DIAGNOSIS — M7731 Calcaneal spur, right foot: Secondary | ICD-10-CM

## 2017-07-11 DIAGNOSIS — W19XXXA Unspecified fall, initial encounter: Secondary | ICD-10-CM

## 2017-07-11 DIAGNOSIS — J441 Chronic obstructive pulmonary disease with (acute) exacerbation: Secondary | ICD-10-CM

## 2017-07-11 DIAGNOSIS — J301 Allergic rhinitis due to pollen: Secondary | ICD-10-CM

## 2017-07-11 DIAGNOSIS — E1140 Type 2 diabetes mellitus with diabetic neuropathy, unspecified: Secondary | ICD-10-CM

## 2017-07-11 DIAGNOSIS — M7989 Other specified soft tissue disorders: Secondary | ICD-10-CM | POA: Diagnosis not present

## 2017-07-11 DIAGNOSIS — Z23 Encounter for immunization: Secondary | ICD-10-CM | POA: Diagnosis not present

## 2017-07-11 DIAGNOSIS — Z794 Long term (current) use of insulin: Secondary | ICD-10-CM

## 2017-07-11 DIAGNOSIS — R5383 Other fatigue: Secondary | ICD-10-CM

## 2017-07-11 DIAGNOSIS — S99921A Unspecified injury of right foot, initial encounter: Secondary | ICD-10-CM | POA: Diagnosis not present

## 2017-07-11 DIAGNOSIS — J4520 Mild intermittent asthma, uncomplicated: Secondary | ICD-10-CM

## 2017-07-11 DIAGNOSIS — G249 Dystonia, unspecified: Secondary | ICD-10-CM

## 2017-07-11 DIAGNOSIS — M85871 Other specified disorders of bone density and structure, right ankle and foot: Secondary | ICD-10-CM

## 2017-07-11 DIAGNOSIS — M79674 Pain in right toe(s): Secondary | ICD-10-CM | POA: Diagnosis not present

## 2017-07-11 DIAGNOSIS — I10 Essential (primary) hypertension: Secondary | ICD-10-CM

## 2017-07-11 DIAGNOSIS — S93401A Sprain of unspecified ligament of right ankle, initial encounter: Secondary | ICD-10-CM | POA: Diagnosis not present

## 2017-07-11 DIAGNOSIS — R300 Dysuria: Secondary | ICD-10-CM

## 2017-07-11 DIAGNOSIS — E1142 Type 2 diabetes mellitus with diabetic polyneuropathy: Secondary | ICD-10-CM

## 2017-07-11 DIAGNOSIS — G894 Chronic pain syndrome: Secondary | ICD-10-CM | POA: Diagnosis not present

## 2017-07-11 DIAGNOSIS — M545 Low back pain: Secondary | ICD-10-CM

## 2017-07-11 DIAGNOSIS — L304 Erythema intertrigo: Secondary | ICD-10-CM

## 2017-07-11 DIAGNOSIS — R69 Illness, unspecified: Secondary | ICD-10-CM | POA: Diagnosis not present

## 2017-07-11 LAB — POCT URINALYSIS DIPSTICK
Bilirubin, UA: NEGATIVE
Blood, UA: NEGATIVE
GLUCOSE UA: NEGATIVE
KETONES UA: NEGATIVE
Nitrite, UA: NEGATIVE
Protein, UA: NEGATIVE
UROBILINOGEN UA: 0.2 U/dL
pH, UA: 5 (ref 5.0–8.0)

## 2017-07-11 LAB — POCT GLYCOSYLATED HEMOGLOBIN (HGB A1C): Hemoglobin A1C: 6.8

## 2017-07-11 MED ORDER — LEVOCETIRIZINE DIHYDROCHLORIDE 5 MG PO TABS: 5.0000 mg | ORAL_TABLET | Freq: Every evening | ORAL | 5 refills | Status: DC

## 2017-07-11 MED ORDER — FLUTICASONE-SALMETEROL 250-50 MCG/DOSE IN AEPB: INHALATION_SPRAY | RESPIRATORY_TRACT | 11 refills | Status: DC

## 2017-07-11 MED ORDER — GLUCOSE BLOOD VI STRP: ORAL_STRIP | 3 refills | Status: DC

## 2017-07-11 MED ORDER — METHOCARBAMOL 500 MG PO TABS: 500.0000 mg | ORAL_TABLET | Freq: Three times a day (TID) | ORAL | 1 refills | Status: DC

## 2017-07-11 MED ORDER — DULOXETINE HCL 60 MG PO CPEP: 120.0000 mg | ORAL_CAPSULE | Freq: Every day | ORAL | 5 refills | Status: DC

## 2017-07-11 MED ORDER — AMBULATORY NON FORMULARY MEDICATION: 3 refills | Status: DC

## 2017-07-11 MED ORDER — HYDROCODONE-ACETAMINOPHEN 5-325 MG PO TABS: ORAL_TABLET | ORAL | 0 refills | Status: DC

## 2017-07-11 MED ORDER — "INSULIN SYRINGE-NEEDLE U-100 30G X 1/2"" 1 ML MISC": 5 refills | Status: DC

## 2017-07-11 MED ORDER — PIOGLITAZONE HCL 30 MG PO TABS: 30.0000 mg | ORAL_TABLET | Freq: Every day | ORAL | 2 refills | Status: DC

## 2017-07-11 MED ORDER — LIRAGLUTIDE 18 MG/3ML ~~LOC~~ SOPN: PEN_INJECTOR | SUBCUTANEOUS | 2 refills | Status: DC

## 2017-07-11 MED ORDER — NYSTATIN 100000 UNIT/GM EX POWD: CUTANEOUS | 1 refills | Status: DC

## 2017-07-11 MED ORDER — GABAPENTIN 800 MG PO TABS: 800.0000 mg | ORAL_TABLET | Freq: Three times a day (TID) | ORAL | 5 refills | Status: DC

## 2017-07-11 MED ORDER — ALBUTEROL SULFATE HFA 108 (90 BASE) MCG/ACT IN AERS: INHALATION_SPRAY | RESPIRATORY_TRACT | 5 refills | Status: DC

## 2017-07-11 MED ORDER — OLOPATADINE HCL 0.2 % OP SOLN: OPHTHALMIC | 3 refills | Status: DC

## 2017-07-11 MED ORDER — FLUCONAZOLE 150 MG PO TABS: 150.0000 mg | ORAL_TABLET | Freq: Once | ORAL | 0 refills | Status: AC

## 2017-07-11 MED ORDER — MELOXICAM 15 MG PO TABS: 15.0000 mg | ORAL_TABLET | Freq: Every day | ORAL | 1 refills | Status: DC

## 2017-07-11 MED ORDER — INSULIN GLARGINE 100 UNIT/ML SOLOSTAR PEN: 40.0000 [IU] | PEN_INJECTOR | Freq: Every day | SUBCUTANEOUS | 2 refills | Status: DC

## 2017-07-11 MED ORDER — PROPRANOLOL HCL 20 MG PO TABS: 20.0000 mg | ORAL_TABLET | Freq: Two times a day (BID) | ORAL | 5 refills | Status: DC

## 2017-07-11 MED ORDER — FUROSEMIDE 20 MG PO TABS: 20.0000 mg | ORAL_TABLET | Freq: Every day | ORAL | 5 refills | Status: DC

## 2017-07-11 MED ORDER — INSULIN PEN NEEDLE 32G X 6 MM MISC: 2 refills | Status: DC

## 2017-07-11 MED ORDER — AZELASTINE HCL 0.1 % NA SOLN: 2.0000 | Freq: Two times a day (BID) | NASAL | 6 refills | Status: DC

## 2017-07-11 MED ORDER — LORAZEPAM 0.5 MG PO TABS: 0.5000 mg | ORAL_TABLET | Freq: Three times a day (TID) | ORAL | 0 refills | Status: DC | PRN

## 2017-07-11 MED ORDER — NYSTATIN 100000 UNIT/GM EX OINT: TOPICAL_OINTMENT | CUTANEOUS | 0 refills | Status: DC

## 2017-07-11 MED ORDER — METFORMIN HCL 1000 MG PO TABS: 1000.0000 mg | ORAL_TABLET | Freq: Two times a day (BID) | ORAL | 5 refills | Status: DC

## 2017-07-11 NOTE — Progress Notes (Signed)
Subjective:    Patient ID: [name] Rice, female    DOB: 1956/02/25, 61 y.o.   MRN: [id number]  HPI Pt is a 61 yo morbidly obese female who presents to the clinic for follow up.   Today on the way in here she falls and twists her right ankle inversion style. It is very painful and not able to bear weight on it. She wants to make sure not broken. Not tried anything to make better.   DM- not checking glucose. Most of the time she tries to take medication. Denies any hypoglyecmia. She admits to not eating like she should. She is completely inactive. She does get a persisent yeast rash under her abdomen. She has nystatin but wants soomething a little stronger.   Pt has ongoing asthma/COPD. She is using albuterol as needed. advair most days.   HTN- denies any CP, palpitations, headaches, vision changes.   Back pain/chronic pain- on hydrocodone most days. Tries to get supply last more than a month. On cymbalta.   Pt admits she continues to be down about her money situation. They can't afford anything not even cable.   . Active Ambulatory Problems    Diagnosis Date Noted  . Type II or unspecified type diabetes mellitus with neurological manifestations, not stated as uncontrolled(250.60) 08/08/2006  . DYSLIPIDEMIA 08/08/2006  . Depression with anxiety 01/28/2008  . Essential hypertension 08/08/2006  . COPD 08/08/2006  . GERD 08/08/2006  . Acute bilateral low back pain 11/27/2007  . BPPV (benign paroxysmal positional vertigo), unspecified laterality 07/14/2009  . OSA on CPAP   . Insomnia 02/06/2011  . Preventative health care 08/16/2011  . History of allergic rhinitis 09/07/2011  . Iron deficiency anemia 03/29/2012  . Lipoma of abdominal wall 05/01/2012  . Intestinal bacterial overgrowth 09/20/2012  . DOE (dyspnea on exertion) 03/31/2013  . Hepatic steatosis 09/26/2013  . Diverticulitis 11/27/2013  . Diabetic peripheral neuropathy ([place]) 05/30/2014  . Gastroparesis due to secondary  diabetes ([place]) 09/16/2014  . Ear pain 12/09/2014  . COPD (chronic obstructive pulmonary disease) with chronic bronchitis ([place]) 12/09/2014  . Type 2 diabetes mellitus with complication ([place]) 49/44/9675  . Diarrhea 01/12/2015  . Non-proliferative diabetic retinopathy, right eye ([place]) 11/23/2015  . Chronic pain syndrome 12/15/2015  . Morbid obesity due to excess calories ([place]) 02/16/2016  . Right shoulder pain 02/16/2016  . Unstable balance 02/16/2016  . Intertrigo 03/15/2016  . Left knee pain 03/15/2016  . Post-menopausal 03/16/2016  . Fall 03/16/2016  . Acute reaction to stress 03/28/2016  . Dystonia 09/21/2016  . Neck pain 07/11/2017   Resolved Ambulatory Problems    Diagnosis Date Noted  . IRON DEFICIENCY 01/30/2007  . DEPRESSION 08/08/2006  . TORTICOLLIS, SPASMODIC 02/13/2007  . SINUSITIS, ACUTE 05/27/2009  . ALLERGIC RHINITIS 12/25/2006  . ASTHMA 01/28/2008  . DIVERTICULOSIS, COLON 02/12/2003  . DERMATITIS, SEBORRHEIC 01/30/2007  . GOUT, HX OF 08/08/2006  . PERSONAL HX COLONIC POLYPS 09/16/2008  . NEPHROLITHIASIS, HX OF 01/28/2008  . Calf pain 11/29/2010  . Tremor, essential 02/06/2011  . Seborrheic dermatitis, unspecified 04/06/2011  . Finger pain 04/06/2011  . Shoulder pain, right 08/16/2011  . Left leg pain 04/10/2012  . Right ear pain 05/01/2012  . Seborrheic dermatitis 05/01/2012  . Hypoglycemia 05/01/2012  . Vaginal candidiasis 08/22/2012  . Sigmoid diverticulitis 09/15/2012  . Acute-on-chronic kidney disease Stage II 09/15/2012  . Cyst, breast 03/01/2013  . Lactic acidosis 03/31/2013  . Acute renal insufficiency 03/31/2013  . Abdominal pain, left lower quadrant 04/29/2013  .  Gout 05/01/2013   Past Medical History:  Diagnosis Date  . Anxiety   . Arthritis   . Asthma   . Cervical dystonia   . COPD (chronic obstructive pulmonary disease) ([place])   . Depression   . Diabetes mellitus   . Diarrhea   . Diverticulitis   . Diverticulosis   . Dizziness   .  Gastroparesis   . GERD (gastroesophageal reflux disease)   . History of colonic polyps   . History of kidney stones   . History of seborrheic dermatitis   . HLD (hyperlipidemia)   . Hypertension   . Intussusception ([place])   . Lumbar pain 2011  . Morbid obesity ([place])   . Nephrolithiasis   . OSA on CPAP 2008  . Pneumonia   . Recurrent boils   . Sleep apnea   . Torn rotator cuff RT SHOULDER  . Tremor [place]     Review of Systems  All other systems reviewed and are negative.      Objective:   Physical Exam  Constitutional: She is oriented to person, place, and time. She appears well-developed and well-nourished.  HENT:  Head: Normocephalic and atraumatic.  Neck: Normal range of motion. Neck supple.  Cardiovascular: Normal rate, regular rhythm and normal heart sounds.   Pulmonary/Chest: Effort normal and breath sounds normal.  Musculoskeletal:  Non pitting swelling over left lateral ankle. Tenderness to palpation. No tenderness over anterior foot or plantar fascia. Limited rOM due to pain.   Lymphadenopathy:    She has no cervical adenopathy.  Neurological: She is alert and oriented to person, place, and time.  Psychiatric: She has a normal mood and affect. Her behavior is normal.          Assessment & Plan:  Marland KitchenMarland KitchenDiagnoses and all orders for this visit:  Sprain of right ankle, unspecified ligament, initial encounter  Fall, initial encounter -     DG Foot Complete Right  Type 2 diabetes mellitus with complication, unspecified whether long term insulin use (HCC) -     POCT HgB A1C -     pioglitazone (ACTOS) 30 MG tablet; Take 1 tablet (30 mg total) by mouth daily.  Influenza vaccine needed -     Flu Vaccine QUAD 6+ mos PF IM (Fluarix Quad PF)  Type 2 diabetes mellitus with diabetic neuropathy, with long-term current use of insulin (HCC) -     gabapentin (NEURONTIN) 800 MG tablet; Take 1 tablet (800 mg total) by mouth 3 (three) times daily.  Mild  intermittent asthma without complication -     albuterol (PROAIR HFA) 108 (90 Base) MCG/ACT inhaler; inhale 2 puffs by mouth every 6 hours if needed FOR WHEEZING OR SHORTNESS OF BREATH  Dystonia -     LORazepam (ATIVAN) 0.5 MG tablet; Take 1 tablet (0.5 mg total) by mouth every 8 (eight) hours as needed for anxiety.  Diabetic peripheral neuropathy (HCC) -     Insulin Pen Needle (NOVOFINE) 32G X 6 MM MISC; USE 3 TIMES A DAY WITH NOVOLOG -     metFORMIN (GLUCOPHAGE) 1000 MG tablet; Take 1 tablet (1,000 mg total) by mouth 2 (two) times daily with a meal. -     Insulin Glargine (LANTUS SOLOSTAR) 100 UNIT/ML Solostar Pen; Inject 40 Units into the skin at bedtime. Please include QS 3 months. -     Insulin Syringe-Needle U-100 (B-D INS SYR ULTRAFINE 1CC/30G) 30G X 1/2" 1 ML MISC; use as directed three times a day -  pioglitazone (ACTOS) 30 MG tablet; Take 1 tablet (30 mg total) by mouth daily. -     liraglutide (VICTOZA) 18 MG/3ML SOPN; inject 1.8 milligram subcutaneously daily -     AMBULATORY NON FORMULARY MEDICATION; Test strips and needles for one touch Verio IQ glucometer.  Use to test blood sugar up to four times a day. Diagnosis Type 2 Diabetes -     DULoxetine (CYMBALTA) 60 MG capsule; Take 2 capsules (120 mg total) by mouth daily.  COPD exacerbation (HCC) -     furosemide (LASIX) 20 MG tablet; Take 1 tablet (20 mg total) by mouth daily.  Neck pain -     HYDROcodone-acetaminophen (NORCO/VICODIN) 5-325 MG tablet; TAKE 1 TO 2 TABLETS BY MOUTH TWICE DAILY.  Acute bilateral low back pain, with sciatica presence unspecified -     HYDROcodone-acetaminophen (NORCO/VICODIN) 5-325 MG tablet; TAKE 1 TO 2 TABLETS BY MOUTH TWICE DAILY.  Chronic pain syndrome -     HYDROcodone-acetaminophen (NORCO/VICODIN) 5-325 MG tablet; TAKE 1 TO 2 TABLETS BY MOUTH TWICE DAILY.  Essential hypertension -     propranolol (INDERAL) 20 MG tablet; Take 1 tablet (20 mg total) by mouth 2 (two) times  daily.  Dysuria -     POCT urinalysis dipstick -     Urine Culture  No energy -     CBC -     Comprehensive metabolic panel -     C-reactive protein -     Ferritin -     Sedimentation rate -     TSH -     Vitamin B12 -     Vit D  25 hydroxy (rtn osteoporosis monitoring)  Intertrigo -     nystatin (NYAMYC) powder; apply to affected area three times a day -     nystatin ointment (MYCOSTATIN); apply to affected area twice a day -     fluconazole (DIFLUCAN) 150 MG tablet; Take 1 tablet (150 mg total) by mouth once. Repeat in 48 hours if symptoms persist.  Seasonal allergic rhinitis due to pollen -     levocetirizine (XYZAL) 5 MG tablet; Take 1 tablet (5 mg total) by mouth every evening. -     azelastine (ASTELIN) 0.1 % nasal spray; Place 2 sprays into both nostrils 2 (two) times daily. Use in each nostril as directed  Other orders -     Olopatadine HCl (PATADAY) 0.2 % SOLN; instill 1 drop into both eyes twice a day if needed (Patient taking differently: Place 1 drop into both eyes daily at 6 (six) AM. instill 1 drop into both eyes twice a day if needed) -     methocarbamol (ROBAXIN) 500 MG tablet; Take 1 tablet (500 mg total) by mouth 3 (three) times daily. -     meloxicam (MOBIC) 15 MG tablet; Take 1 tablet (15 mg total) by mouth daily. -     glucose blood (ONETOUCH VERIO) test strip; Check blood sugar 3 times daily. Diagnosis: Type II diabetes mellitus with neurological manifestations (E11.49) -     Fluticasone-Salmeterol (ADVAIR DISKUS) 250-50 MCG/DOSE AEPB; inhale 1 puff by mouth twice a day    a1c 6.8 looks good today.  BP almost to goal under 130/90.  Flu shot given today.  Continue medications.  LDL at 76. Follow up in 3 months.   Xray is negative for fracture. Ace wrapped today and discussed rest/ice/compression/NsAIDs and good supportive show. Follow up in 2 weeks. HO given.   Pt is also depression which could be  causing fatigue but will look at metabolic reasons as  well.   [place] controlled substance database was down today and not reviewed.  Refill given of hydrocodone.  Pain contract up to date.  Discussed not taking benzo with norco. Patient only takes benzo for her dystonia when it is really bad. She has not been able to afford to go to neurology for evaluation.   Medication refills done today.   Marland Kitchen.Spent 45 minutes with patient and greater than 50 percent of visit spent counseling patient regarding treatment plan.

## 2017-07-11 NOTE — Patient Instructions (Signed)
Ankle Sprain, Phase I Rehab Ask your health care provider which exercises are safe for you. Do exercises exactly as told by your health care provider and adjust them as directed. It is normal to feel mild stretching, pulling, tightness, or discomfort as you do these exercises, but you should stop right away if you feel sudden pain or your pain gets worse.Do not begin these exercises until told by your health care provider. Stretching and range of motion exercises These exercises warm up your muscles and joints and improve the movement and flexibility of your lower leg and ankle. These exercises also help to relieve pain and stiffness. Exercise A: Gastroc and soleus stretch  1. Sit on the floor with your left / right leg extended. 2. Loop a belt or towel around the ball of your left / right foot. The ball of your foot is on the walking surface, right under your toes. 3. Keep your left / right ankle and foot relaxed and keep your knee straight while you use the belt or towel to pull your foot toward you. You should feel a gentle stretch behind your calf or knee. 4. Hold this position for __________ seconds, then release to the starting position. Repeat the exercise with your knee bent. You can put a pillow or a rolled bath towel under your knee to support it. You should feel a stretch deep in your calf or at your Achilles tendon. Repeat each stretch __________ times. Complete these stretches __________ times a day. Exercise B: Ankle alphabet  1. Sit with your left / right leg supported at the lower leg. ? Do not rest your foot on anything. ? Make sure your foot has room to move freely. 2. Think of your left / right foot as a paintbrush, and move your foot to trace each letter of the alphabet in the air. Keep your hip and knee still while you trace. Make the letters as large as you can without feeling discomfort. 3. Trace every letter from A to Z. Repeat __________ times. Complete this exercise  __________ times a day. Strengthening exercises These exercises build strength and endurance in your ankle and lower leg. Endurance is the ability to use your muscles for a long time, even after they get tired. Exercise C: Dorsiflexors  1. Secure a rubber exercise band or tube to an object, such as a table leg, that will stay still when the band is pulled. Secure the other end around your left / right foot. 2. Sit on the floor facing the object, with your left / right leg extended. The band or tube should be slightly tense when your foot is relaxed. 3. Slowly bring your foot toward you, pulling the band tighter. 4. Hold this position for __________ seconds. 5. Slowly return your foot to the starting position. Repeat __________ times. Complete this exercise __________ times a day. Exercise D: Plantar flexors  1. Sit on the floor with your left / right leg extended. 2. Loop a rubber exercise tube or band around the ball of your left / right foot. The ball of your foot is on the walking surface, right under your toes. ? Hold the ends of the band or tube in your hands. ? The band or tube should be slightly tense when your foot is relaxed. 3. Slowly point your foot and toes downward, pushing them away from you. 4. Hold this position for __________ seconds. 5. Slowly return your foot to the starting position. Repeat __________ times. Complete   this exercise __________ times a day. Exercise E: Evertors 1. Sit on the floor with your legs straight out in front of you. 2. Loop a rubber exercise band or tube around the ball of your left / right foot. The ball of your foot is on the walking surface, right under your toes. ? Hold the ends of the band in your hands, or secure the band to a stable object. ? The band or tube should be slightly tense when your foot is relaxed. 3. Slowly push your foot outward, away from your other leg. 4. Hold this position for __________ seconds. 5. Slowly return your  foot to the starting position. Repeat __________ times. Complete this exercise __________ times a day. This information is not intended to replace advice given to you by your health care provider. Make sure you discuss any questions you have with your health care provider. Document Released: 05/04/2005 Document Revised: 06/09/2016 Document Reviewed: 08/17/2015 Elsevier Interactive Patient Education  2018 [employer].  

## 2017-07-13 ENCOUNTER — Encounter: Payer: Self-pay | Admitting: Physician Assistant

## 2017-07-13 ENCOUNTER — Telehealth: Payer: Self-pay

## 2017-07-13 LAB — URINE CULTURE
MICRO NUMBER:: 81060895
SPECIMEN QUALITY:: ADEQUATE

## 2017-07-13 MED ORDER — AMBULATORY NON FORMULARY MEDICATION: 0 refills | Status: DC

## 2017-07-13 NOTE — Telephone Encounter (Signed)
Placed in your box to sign

## 2017-07-13 NOTE — Telephone Encounter (Signed)
Pt is calling to see the status of a script for a new cane.  She has called [facility] in [place], and they have not received it yet.  Please advise.

## 2017-07-13 NOTE — Telephone Encounter (Signed)
Thanks will sign.

## 2017-07-13 NOTE — Telephone Encounter (Signed)
im ok for cane. I don't know that I was aware of need for it. Confirm she wants 4 pronged cane?

## 2017-07-14 ENCOUNTER — Other Ambulatory Visit: Payer: Self-pay

## 2017-07-14 DIAGNOSIS — X58XXXA Exposure to other specified factors, initial encounter: Secondary | ICD-10-CM | POA: Diagnosis not present

## 2017-07-14 DIAGNOSIS — M7731 Calcaneal spur, right foot: Secondary | ICD-10-CM | POA: Diagnosis not present

## 2017-07-14 DIAGNOSIS — S99911A Unspecified injury of right ankle, initial encounter: Secondary | ICD-10-CM | POA: Diagnosis not present

## 2017-07-14 DIAGNOSIS — I10 Essential (primary) hypertension: Secondary | ICD-10-CM | POA: Diagnosis not present

## 2017-07-14 DIAGNOSIS — J449 Chronic obstructive pulmonary disease, unspecified: Secondary | ICD-10-CM | POA: Diagnosis not present

## 2017-07-14 DIAGNOSIS — E119 Type 2 diabetes mellitus without complications: Secondary | ICD-10-CM | POA: Diagnosis not present

## 2017-07-14 DIAGNOSIS — S99921A Unspecified injury of right foot, initial encounter: Secondary | ICD-10-CM | POA: Diagnosis not present

## 2017-07-14 DIAGNOSIS — Z881 Allergy status to other antibiotic agents status: Secondary | ICD-10-CM | POA: Diagnosis not present

## 2017-07-14 DIAGNOSIS — R262 Difficulty in walking, not elsewhere classified: Secondary | ICD-10-CM | POA: Diagnosis not present

## 2017-07-14 DIAGNOSIS — M25571 Pain in right ankle and joints of right foot: Secondary | ICD-10-CM | POA: Diagnosis not present

## 2017-07-14 DIAGNOSIS — S93601A Unspecified sprain of right foot, initial encounter: Secondary | ICD-10-CM | POA: Diagnosis not present

## 2017-07-14 DIAGNOSIS — M85871 Other specified disorders of bone density and structure, right ankle and foot: Secondary | ICD-10-CM | POA: Diagnosis not present

## 2017-07-14 DIAGNOSIS — S93401A Sprain of unspecified ligament of right ankle, initial encounter: Secondary | ICD-10-CM | POA: Diagnosis not present

## 2017-07-14 DIAGNOSIS — E669 Obesity, unspecified: Secondary | ICD-10-CM | POA: Diagnosis not present

## 2017-07-14 DIAGNOSIS — E1136 Type 2 diabetes mellitus with diabetic cataract: Secondary | ICD-10-CM | POA: Diagnosis not present

## 2017-07-14 DIAGNOSIS — R202 Paresthesia of skin: Secondary | ICD-10-CM | POA: Diagnosis not present

## 2017-07-14 DIAGNOSIS — M7989 Other specified soft tissue disorders: Secondary | ICD-10-CM | POA: Diagnosis not present

## 2017-07-14 DIAGNOSIS — E1140 Type 2 diabetes mellitus with diabetic neuropathy, unspecified: Secondary | ICD-10-CM | POA: Diagnosis not present

## 2017-07-14 DIAGNOSIS — Z794 Long term (current) use of insulin: Secondary | ICD-10-CM | POA: Diagnosis not present

## 2017-07-14 NOTE — Progress Notes (Signed)
error 

## 2017-07-20 ENCOUNTER — Other Ambulatory Visit: Payer: Self-pay | Admitting: Physician Assistant

## 2017-07-20 DIAGNOSIS — E118 Type 2 diabetes mellitus with unspecified complications: Secondary | ICD-10-CM

## 2017-07-20 DIAGNOSIS — Z794 Long term (current) use of insulin: Secondary | ICD-10-CM

## 2017-07-21 ENCOUNTER — Other Ambulatory Visit: Payer: Self-pay | Admitting: Physician Assistant

## 2017-08-07 ENCOUNTER — Telehealth: Payer: Self-pay

## 2017-08-07 ENCOUNTER — Ambulatory Visit (INDEPENDENT_AMBULATORY_CARE_PROVIDER_SITE_OTHER): Payer: Medicare HMO | Admitting: Family Medicine

## 2017-08-07 ENCOUNTER — Encounter: Payer: Self-pay | Admitting: Family Medicine

## 2017-08-07 ENCOUNTER — Other Ambulatory Visit: Payer: Self-pay | Admitting: Physician Assistant

## 2017-08-07 VITALS — BP 130/63 | HR 66 | Wt 270.0 lb

## 2017-08-07 DIAGNOSIS — G894 Chronic pain syndrome: Secondary | ICD-10-CM

## 2017-08-07 DIAGNOSIS — M542 Cervicalgia: Secondary | ICD-10-CM

## 2017-08-07 DIAGNOSIS — S99921A Unspecified injury of right foot, initial encounter: Secondary | ICD-10-CM

## 2017-08-07 DIAGNOSIS — M545 Low back pain: Secondary | ICD-10-CM

## 2017-08-07 MED ORDER — LIDOCAINE 5 % EX PTCH: 1.0000 | MEDICATED_PATCH | CUTANEOUS | 1 refills | Status: AC

## 2017-08-07 MED ORDER — HYDROCODONE-ACETAMINOPHEN 5-325 MG PO TABS: ORAL_TABLET | ORAL | 0 refills | Status: DC

## 2017-08-07 NOTE — Telephone Encounter (Signed)
PT needs refill on Hydrocodone. Pharmacy on file

## 2017-08-07 NOTE — Progress Notes (Signed)
Pt needs her norco for pain. She is due for norco refill. [place] controlled databased reviewed without concern.

## 2017-08-07 NOTE — Patient Instructions (Signed)
Thank you for coming in today. You should hear about MRI scheduling.  Recheck at least 24 hours after MRI.   If we cant do it here make sure they make a CD of the pictures for you to bring with your.   Continue the boot until the MRI.

## 2017-08-07 NOTE — Telephone Encounter (Signed)
Pt picked up in office today.

## 2017-08-07 NOTE — Progress Notes (Signed)
[name] Rice is a 61 y.o. female who presents to [facility] today for right foot pain.  Patient injured her right foot walking up stairs.  She essentially fell.  She has had a workup already with 2 separate x-rays revealing no fractures.  She has been using a cam walker boot which does help.  The pain has been ongoing for about 4 weeks and is still present.  She locates the mid and forefoot is a source of pain.  She notes swelling and initially ecchymosis.  She denies any fevers or chills vomiting or diarrhea   Past Medical History:  Diagnosis Date  . Anxiety   . Arthritis   . Asthma    no PFTs  . Cervical dystonia   . COPD (chronic obstructive pulmonary disease) (HCC)    no PFTs  . Depression   . Diabetes mellitus   . Diarrhea   . Diverticulitis   . Diverticulosis   . Dizziness   . Gastroparesis   . GERD (gastroesophageal reflux disease)   . History of colonic polyps    hyperplastic  . History of kidney stones   . History of seborrheic dermatitis    face,ears  . HLD (hyperlipidemia)   . Hypertension   . Intussusception ([place])   . Lumbar pain 2011   per x-ray - Multilevel spondylosis  . Morbid obesity ([place])   . Nephrolithiasis   . OSA on CPAP 2008  . Pneumonia   . Recurrent boils    "SLUSTER BOILS" ON ABD AND LABIA  . Sleep apnea    USES C-PAP  . Torn rotator cuff RT SHOULDER  . Tremor CCENTRAL NERVOUS SYSTEM   Past Surgical History:  Procedure Laterality Date  . CARPAL TUNNEL RELEASE Left 2006  . ELBOW SURGERY Left   . HAND SURGERY Right RIGHT  . JOINT REPLACEMENT Right    shoulder  . KNEE ARTHROSCOPY Left   . LUMBAR LAMINECTOMY    . MASS EXCISION  06/28/2012   Procedure: EXCISION MASS;  Surgeon: Harl Bowie, MD;  Location: [facility];  Service: General;  Laterality: Left;  excision left upper quadrant abdominal wall mass, Benign Lipoma  . NASAL SINUS SURGERY    . PILONIDAL CYST EXCISION  [id number]  . TUBAL  LIGATION     Social History  Substance Use Topics  . Smoking status: Former Smoker    Types: Cigarettes    Quit date: 10/17/1996  . Smokeless tobacco: Never Used  . Alcohol use No     ROS:  As above   Medications: Current Outpatient Prescriptions  Medication Sig Dispense Refill  . albuterol (PROAIR HFA) 108 (90 Base) MCG/ACT inhaler inhale 2 puffs by mouth every 6 hours if needed FOR WHEEZING OR SHORTNESS OF BREATH 8.5 g 5  . AMBULATORY NON FORMULARY MEDICATION Diabetic shoes  Dx: Diabetes, uncontrolled 250.60 with neuropathy. 1 Device 0  . AMBULATORY NON FORMULARY MEDICATION Test strips and needles for one touch Verio IQ glucometer.  Use to test blood sugar up to four times a day. Diagnosis Type 2 Diabetes 100 each 3  . AMBULATORY NON FORMULARY MEDICATION 4 prong cane DX R06.89 1 each 0  . azelastine (ASTELIN) 0.1 % nasal spray Place 2 sprays into both nostrils 2 (two) times daily. Use in each nostril as directed 30 mL 6  . DULoxetine (CYMBALTA) 60 MG capsule Take 2 capsules (120 mg total) by mouth daily. 60 capsule 5  . Fluticasone-Salmeterol (ADVAIR DISKUS)  250-50 MCG/DOSE AEPB inhale 1 puff by mouth twice a day 60 each 11  . furosemide (LASIX) 20 MG tablet Take 1 tablet (20 mg total) by mouth daily. 30 tablet 5  . gabapentin (NEURONTIN) 800 MG tablet Take 1 tablet (800 mg total) by mouth 3 (three) times daily. 90 tablet 5  . glucose blood (ONETOUCH VERIO) test strip Check blood sugar 3 times daily. Diagnosis: Type II diabetes mellitus with neurological manifestations (E11.49) 100 each 3  . HYDROcodone-acetaminophen (NORCO/VICODIN) 5-325 MG tablet TAKE 1 TO 2 TABLETS BY MOUTH TWICE DAILY. 60 tablet 0  . Insulin Glargine (LANTUS SOLOSTAR) 100 UNIT/ML Solostar Pen Inject 40 Units into the skin at bedtime. Please include QS 3 months. 4 pen 2  . Insulin Pen Needle (NOVOFINE) 32G X 6 MM MISC USE 3 TIMES A DAY WITH NOVOLOG 100 each 2  . Insulin Syringe-Needle U-100 (B-D INS SYR ULTRAFINE  1CC/30G) 30G X 1/2" 1 ML MISC use as directed three times a day 100 each 5  . levocetirizine (XYZAL) 5 MG tablet Take 1 tablet (5 mg total) by mouth every evening. 30 tablet 5  . lidocaine (LIDODERM) 5 % Place 1 patch onto the skin daily. 30 patch 1  . liraglutide (VICTOZA) 18 MG/3ML SOPN inject 1.8 milligram subcutaneously daily 6 mL 2  . LORazepam (ATIVAN) 0.5 MG tablet Take 1 tablet (0.5 mg total) by mouth every 8 (eight) hours as needed for anxiety. 20 tablet 0  . meclizine (ANTIVERT) 25 MG tablet Take 1 tablet (25 mg total) by mouth 3 (three) times daily as needed for dizziness. 30 tablet 0  . meloxicam (MOBIC) 15 MG tablet Take 1 tablet (15 mg total) by mouth daily. 30 tablet 1  . metFORMIN (GLUCOPHAGE) 1000 MG tablet Take 1 tablet (1,000 mg total) by mouth 2 (two) times daily with a meal. 60 tablet 5  . methocarbamol (ROBAXIN) 500 MG tablet Take 1 tablet (500 mg total) by mouth 3 (three) times daily. 270 tablet 1  . nystatin [facility]) powder apply to affected area three times a day 60 g 1  . nystatin ointment (MYCOSTATIN) apply to affected area twice a day 60 g 0  . Olopatadine HCl (PATADAY) 0.2 % SOLN instill 1 drop into both eyes twice a day if needed (Patient taking differently: Place 1 drop into both eyes daily at 6 (six) AM. instill 1 drop into both eyes twice a day if needed) 2.5 mL 3  . pioglitazone (ACTOS) 30 MG tablet Take 1 tablet (30 mg total) by mouth daily. 30 tablet 2  . pioglitazone (ACTOS) 30 MG tablet TAKE 1 TABLET BY MOUTH ONCE DAILY 30 tablet 1  . propranolol (INDERAL) 20 MG tablet Take 1 tablet (20 mg total) by mouth 2 (two) times daily. 60 tablet 5   No current facility-administered medications for this visit.    Allergies  Allergen Reactions  . Ace Inhibitors Other (See Comments)    Causes blood pressure to drop rapidly  . Cefaclor Hives and Itching  . Shrimp [Shellfish Allergy] Nausea And Vomiting  . Ceclor [Cefaclor]   . Glyxambi [Empagliflozin-Linagliptin]      Itching/rash  . Sulfa Antibiotics   . Sulfonamide Derivatives Rash     Exam:  BP 130/63   Pulse 66   Wt 270 lb (122.5 kg)   LMP 10/18/1995   BMI 46.71 kg/m  General: Well Developed, well nourished, and in no acute distress.  Neuro/Psych: Alert and oriented x3, extra-ocular muscles intact, able to  move all 4 extremities, sensation grossly intact. Skin: Warm and dry, no rashes noted.  Respiratory: Not using accessory muscles, speaking in full sentences, trachea midline.  Cardiovascular: Pulses palpable, no extremity edema. Abdomen: Does not appear distended. MSK: Right foot slightly swollen and tender along the midfoot and forefoot.  Nontender at the Lisfranc joint.  Pulses capillary refill and sensation are intact.  X-ray right foot reviewed    No results found for this or any previous visit (from the past 48 hour(s)). No results found.    Assessment and Plan: 61 y.o. female with right foot pain.  The cause of the pain is not clear based on x-rays and exam.  I am concerned she has a radiographically occult fracture and plan to obtain an MRI to evaluate the cause of pain and swelling.  In the interim continue Cam Walker boot and weightbearing as tolerated.  Recheck after MRI.    Orders Placed This Encounter  Procedures  . MR FOOT RIGHT WO CONTRAST    Standing Status:   Future    Standing Expiration Date:   10/07/2018    Order Specific Question:   What is the patient's sedation requirement?    Answer:   No Sedation    Order Specific Question:   Does the patient have a pacemaker or implanted devices?    Answer:   No    Order Specific Question:   Preferred imaging location?    Answer:   Product/process development scientist (table limit-350lbs)    Order Specific Question:   Radiology Contrast Protocol - do NOT remove file path    Answer:   [email or web address]    Order Specific Question:   Reason for Exam additional comments    Answer:   Mid and forefoot pain x 4  week. neg xray suspect occult fracture.   Meds ordered this encounter  Medications  . DISCONTD: lidocaine (LIDODERM) 5 %    Sig: Place onto the skin.  Marland Kitchen lidocaine (LIDODERM) 5 %    Sig: Place 1 patch onto the skin daily.    Dispense:  30 patch    Refill:  1    Discussed warning signs or symptoms. Please see discharge instructions. Patient expresses understanding.

## 2017-08-11 ENCOUNTER — Other Ambulatory Visit: Payer: Self-pay | Admitting: Physician Assistant

## 2017-08-11 DIAGNOSIS — S99921A Unspecified injury of right foot, initial encounter: Secondary | ICD-10-CM

## 2017-08-11 NOTE — Progress Notes (Signed)
Order changed per Pt request, she wants this completed earlier than she can at our location.

## 2017-08-12 ENCOUNTER — Ambulatory Visit (HOSPITAL_BASED_OUTPATIENT_CLINIC_OR_DEPARTMENT_OTHER)
Admission: RE | Admit: 2017-08-12 | Discharge: 2017-08-12 | Disposition: A | Discharge status: Home / Self Care | Payer: Medicare HMO | Source: Ambulatory Visit | Attending: Family Medicine | Admitting: Family Medicine

## 2017-08-12 ENCOUNTER — Other Ambulatory Visit: Payer: Self-pay | Admitting: Physician Assistant

## 2017-08-12 DIAGNOSIS — S99921A Unspecified injury of right foot, initial encounter: Secondary | ICD-10-CM | POA: Insufficient documentation

## 2017-08-12 DIAGNOSIS — S92241A Displaced fracture of medial cuneiform of right foot, initial encounter for closed fracture: Secondary | ICD-10-CM | POA: Insufficient documentation

## 2017-08-12 DIAGNOSIS — X58XXXA Exposure to other specified factors, initial encounter: Secondary | ICD-10-CM | POA: Diagnosis not present

## 2017-08-12 DIAGNOSIS — L304 Erythema intertrigo: Secondary | ICD-10-CM

## 2017-08-12 DIAGNOSIS — S92321A Displaced fracture of second metatarsal bone, right foot, initial encounter for closed fracture: Secondary | ICD-10-CM | POA: Insufficient documentation

## 2017-08-12 DIAGNOSIS — M7989 Other specified soft tissue disorders: Secondary | ICD-10-CM | POA: Insufficient documentation

## 2017-08-12 DIAGNOSIS — M6250 Muscle wasting and atrophy, not elsewhere classified, unspecified site: Secondary | ICD-10-CM | POA: Insufficient documentation

## 2017-08-14 ENCOUNTER — Other Ambulatory Visit: Payer: Self-pay | Admitting: *Deleted

## 2017-08-14 MED ORDER — MELOXICAM 15 MG PO TABS: 15.0000 mg | ORAL_TABLET | Freq: Every day | ORAL | 1 refills | Status: DC

## 2017-08-14 MED ORDER — NYSTATIN 100000 UNIT/GM EX POWD: CUTANEOUS | 1 refills | Status: DC

## 2017-08-17 ENCOUNTER — Ambulatory Visit: Payer: Self-pay | Admitting: Family Medicine

## 2017-08-23 ENCOUNTER — Encounter: Payer: Self-pay | Admitting: Family Medicine

## 2017-08-23 ENCOUNTER — Ambulatory Visit (INDEPENDENT_AMBULATORY_CARE_PROVIDER_SITE_OTHER): Payer: Medicare HMO | Admitting: Family Medicine

## 2017-08-23 DIAGNOSIS — S92901A Unspecified fracture of right foot, initial encounter for closed fracture: Secondary | ICD-10-CM | POA: Diagnosis not present

## 2017-08-23 NOTE — Progress Notes (Signed)
[name] Rice is a 61 y.o. female who presents to [facility] today for follow up right foot injury and MRI. [name] Rice injured her foot on 07/11/17. She was seen by her PCP where xrays were negative. She was treated empirically with Cam Walker boot limited weightbearing and relative rest.  She is doing quite well and feels improved symptoms.   Past Medical History:  Diagnosis Date  . Anxiety   . Arthritis   . Asthma    no PFTs  . Cervical dystonia   . COPD (chronic obstructive pulmonary disease) (HCC)    no PFTs  . Depression   . Diabetes mellitus   . Diarrhea   . Diverticulitis   . Diverticulosis   . Dizziness   . Gastroparesis   . GERD (gastroesophageal reflux disease)   . History of colonic polyps    hyperplastic  . History of kidney stones   . History of seborrheic dermatitis    face,ears  . HLD (hyperlipidemia)   . Hypertension   . Intussusception ([place])   . Lumbar pain 2011   per x-ray - Multilevel spondylosis  . Morbid obesity ([place])   . Nephrolithiasis   . OSA on CPAP 2008  . Pneumonia   . Recurrent boils    "SLUSTER BOILS" ON ABD AND LABIA  . Sleep apnea    USES C-PAP  . Torn rotator cuff RT SHOULDER  . Tremor CCENTRAL NERVOUS SYSTEM   Past Surgical History:  Procedure Laterality Date  . CARPAL TUNNEL RELEASE Left 2006  . ELBOW SURGERY Left   . HAND SURGERY Right RIGHT  . JOINT REPLACEMENT Right    shoulder  . KNEE ARTHROSCOPY Left   . LUMBAR LAMINECTOMY    . NASAL SINUS SURGERY    . PILONIDAL CYST EXCISION  [id number]  . TUBAL LIGATION     Social History   Tobacco Use  . Smoking status: Former Smoker    Types: Cigarettes    Last attempt to quit: 10/17/1996    Years since quitting: 20.8  . Smokeless tobacco: Never Used  Substance Use Topics  . Alcohol use: No     ROS:  As above   Medications: Current Outpatient Medications  Medication Sig Dispense Refill  . albuterol (PROAIR HFA) 108 (90 Base)  MCG/ACT inhaler inhale 2 puffs by mouth every 6 hours if needed FOR WHEEZING OR SHORTNESS OF BREATH 8.5 g 5  . AMBULATORY NON FORMULARY MEDICATION Diabetic shoes  Dx: Diabetes, uncontrolled 250.60 with neuropathy. 1 Device 0  . AMBULATORY NON FORMULARY MEDICATION Test strips and needles for one touch Verio IQ glucometer.  Use to test blood sugar up to four times a day. Diagnosis Type 2 Diabetes 100 each 3  . AMBULATORY NON FORMULARY MEDICATION 4 prong cane DX R06.89 1 each 0  . azelastine (ASTELIN) 0.1 % nasal spray Place 2 sprays into both nostrils 2 (two) times daily. Use in each nostril as directed 30 mL 6  . DULoxetine (CYMBALTA) 60 MG capsule Take 2 capsules (120 mg total) by mouth daily. 60 capsule 5  . Fluticasone-Salmeterol (ADVAIR DISKUS) 250-50 MCG/DOSE AEPB inhale 1 puff by mouth twice a day 60 each 11  . furosemide (LASIX) 20 MG tablet Take 1 tablet (20 mg total) by mouth daily. 30 tablet 5  . gabapentin (NEURONTIN) 800 MG tablet Take 1 tablet (800 mg total) by mouth 3 (three) times daily. 90 tablet 5  . glucose blood (ONETOUCH VERIO) test strip  Check blood sugar 3 times daily. Diagnosis: Type II diabetes mellitus with neurological manifestations (E11.49) 100 each 3  . HYDROcodone-acetaminophen (NORCO/VICODIN) 5-325 MG tablet TAKE 1 TO 2 TABLETS BY MOUTH TWICE DAILY. 60 tablet 0  . Insulin Glargine (LANTUS SOLOSTAR) 100 UNIT/ML Solostar Pen Inject 40 Units into the skin at bedtime. Please include QS 3 months. 4 pen 2  . Insulin Pen Needle (NOVOFINE) 32G X 6 MM MISC USE 3 TIMES A DAY WITH NOVOLOG 100 each 2  . Insulin Syringe-Needle U-100 (B-D INS SYR ULTRAFINE 1CC/30G) 30G X 1/2" 1 ML MISC use as directed three times a day 100 each 5  . levocetirizine (XYZAL) 5 MG tablet Take 1 tablet (5 mg total) by mouth every evening. 30 tablet 5  . lidocaine (LIDODERM) 5 % Place 1 patch onto the skin daily. 30 patch 1  . liraglutide (VICTOZA) 18 MG/3ML SOPN inject 1.8 milligram subcutaneously daily  6 mL 2  . LORazepam (ATIVAN) 0.5 MG tablet Take 1 tablet (0.5 mg total) by mouth every 8 (eight) hours as needed for anxiety. 20 tablet 0  . meclizine (ANTIVERT) 25 MG tablet Take 1 tablet (25 mg total) by mouth 3 (three) times daily as needed for dizziness. 30 tablet 0  . meloxicam (MOBIC) 15 MG tablet Take 1 tablet (15 mg total) by mouth daily. 30 tablet 1  . metFORMIN (GLUCOPHAGE) 1000 MG tablet Take 1 tablet (1,000 mg total) by mouth 2 (two) times daily with a meal. 60 tablet 5  . methocarbamol (ROBAXIN) 500 MG tablet Take 1 tablet (500 mg total) by mouth 3 (three) times daily. 270 tablet 1  . nystatin ointment (MYCOSTATIN) apply to affected area twice a day 60 g 0  . NYSTATIN powder APPLY EXTERNALLY TO THE AFFECTED AREA THREE TIMES DAILY AS DIRECTED 60 g prn  . Olopatadine HCl (PATADAY) 0.2 % SOLN instill 1 drop into both eyes twice a day if needed (Patient taking differently: Place 1 drop into both eyes daily at 6 (six) AM. instill 1 drop into both eyes twice a day if needed) 2.5 mL 3  . pioglitazone (ACTOS) 30 MG tablet Take 1 tablet (30 mg total) by mouth daily. 30 tablet 2  . pioglitazone (ACTOS) 30 MG tablet TAKE 1 TABLET BY MOUTH ONCE DAILY 30 tablet 1  . propranolol (INDERAL) 20 MG tablet Take 1 tablet (20 mg total) by mouth 2 (two) times daily. 60 tablet 5   No current facility-administered medications for this visit.    Allergies  Allergen Reactions  . Ace Inhibitors Other (See Comments)    Causes blood pressure to drop rapidly  . Cefaclor Hives and Itching  . Shrimp [Shellfish Allergy] Nausea And Vomiting  . Ceclor [Cefaclor]   . Glyxambi [Empagliflozin-Linagliptin]     Itching/rash  . Sulfa Antibiotics   . Sulfonamide Derivatives Rash     Exam:  BP (!) 170/70   Pulse 81   Wt 272 lb (123.4 kg)   LMP 10/18/1995   BMI 47.06 kg/m  General: Well Developed, well nourished, and in no acute distress.  Neuro/Psych: Alert and oriented x3, extra-ocular muscles intact, able  to move all 4 extremities, sensation grossly intact. Skin: Warm and dry, no rashes noted.  Respiratory: Not using accessory muscles, speaking in full sentences, trachea midline.  Cardiovascular: Pulses palpable, no extremity edema. Abdomen: Does not appear distended. MSK: Right foot normal-appearing nontender normal motion.   CLINICAL DATA:  Right forefoot pain extending into the toes with swelling for  1 month. Pain began while climbing stairs. No acute injury or prior relevant surgery.  EXAM: MRI OF THE RIGHT FOREFOOT WITHOUT CONTRAST  TECHNIQUE: Multiplanar, multisequence MR imaging of the right forefoot was performed. No intravenous contrast was administered.  COMPARISON:  Foot radiographs 07/11/2017.  FINDINGS: Despite efforts by the technologist and patient, mild motion artifact is present on today's exam and could not be eliminated. This reduces exam sensitivity and specificity.  Bones/Joint/Cartilage  Bone detail is limited by the motion. However, there is heterogeneously decreased T1 and increased T2 signal within the base of the second metatarsal and within the medial cuneiform suspicious for subacute fractures. Lesser signal changes are present within the middle cuneiform. The additional metatarsal bases appear intact. No significant subluxation seen at the Lisfranc joint. The toes appear unremarkable.  Ligaments  The Lisfranc ligament is not well visualized.  Muscles and Tendons  Generalized forefoot muscular atrophy. No significant tendon abnormalities identified.  Soft tissues  Soft tissue T2 hyperintensity surrounding the Lisfranc joint consistent with inflammation. There is no focal fluid collection. Mild dorsal subcutaneous edema is present.  IMPRESSION: 1. Suspected fractures involving the second metatarsal base, medial and middle cuneiform bones. These could be insufficiency fractures. 2. Detail is limited by motion. The Lisfranc  ligament is not well visualized. Plain film follow-up recommended to exclude subluxation at the Lisfranc joint. CT may provide additional information if clinically warranted. 3. Forefoot muscular atrophy with mild soft tissue edema.   Electronically Signed   By: Richardean Sale M.D.   On: 08/13/2017 10:46   Assessment and Plan: 61 y.o. female with right foot fracture is seen above involving the medial and middle cuneiform.  Doing well clinically.  Plan to continue Cam Walker boot and transition to regular footwear as guided by pain.  Recheck in 1 month.    No orders of the defined types were placed in this encounter.  No orders of the defined types were placed in this encounter.   Discussed warning signs or symptoms. Please see discharge instructions. Patient expresses understanding.

## 2017-08-23 NOTE — Patient Instructions (Signed)
Thank you for coming in today. Continue the cam walker boot most of the time.  Start transition to regular shoe as tolerated.  Recheck in 1 month.  Return sooner if needed.

## 2017-08-24 ENCOUNTER — Ambulatory Visit: Payer: Self-pay | Admitting: Family Medicine

## 2017-08-28 ENCOUNTER — Other Ambulatory Visit: Payer: Self-pay

## 2017-09-12 ENCOUNTER — Other Ambulatory Visit: Payer: Self-pay

## 2017-09-12 DIAGNOSIS — M542 Cervicalgia: Secondary | ICD-10-CM

## 2017-09-12 DIAGNOSIS — G894 Chronic pain syndrome: Secondary | ICD-10-CM

## 2017-09-12 DIAGNOSIS — M545 Low back pain: Secondary | ICD-10-CM

## 2017-09-12 MED ORDER — HYDROCODONE-ACETAMINOPHEN 5-325 MG PO TABS: ORAL_TABLET | ORAL | 0 refills | Status: DC

## 2017-09-13 ENCOUNTER — Ambulatory Visit (INDEPENDENT_AMBULATORY_CARE_PROVIDER_SITE_OTHER): Payer: Medicare HMO | Admitting: Family Medicine

## 2017-09-13 ENCOUNTER — Encounter: Payer: Self-pay | Admitting: Family Medicine

## 2017-09-13 VITALS — BP 143/71 | HR 80 | Ht 63.0 in | Wt 272.0 lb

## 2017-09-13 DIAGNOSIS — S92901A Unspecified fracture of right foot, initial encounter for closed fracture: Secondary | ICD-10-CM | POA: Diagnosis not present

## 2017-09-13 NOTE — Patient Instructions (Signed)
Thank you for coming in today. Try using the shoe. If you continue to hurt try going back to the boot.  Recheck in about 3 weeks.  Return sooner if needed.

## 2017-09-14 ENCOUNTER — Telehealth: Payer: Self-pay | Admitting: *Deleted

## 2017-09-14 ENCOUNTER — Encounter: Payer: Self-pay | Admitting: Physician Assistant

## 2017-09-14 NOTE — Telephone Encounter (Signed)
Prior auth forms faxed to insurance

## 2017-09-14 NOTE — Progress Notes (Signed)
[name] Rice is a 61 y.o. female who presents to [facility] today for Follow-up foot fracture. Patient has been seen several times for right foot injury initially not seen on x-ray. MRI on October 27 subsequently showed Fracture at the second metatarsal base and medial and middle cuneiform bones.  She was initially immobilized in a cam walker boot and did pretty well. In the last 2 weeks she's tried to wean out of the boot and notes that with no support she continues to experience significant pain with weightbearing. She has Great difficulty getting the boot on herself and notes a significant hindrance.   Past Medical History:  Diagnosis Date  . Anxiety   . Arthritis   . Asthma    no PFTs  . Cervical dystonia   . COPD (chronic obstructive pulmonary disease) (HCC)    no PFTs  . Depression   . Diabetes mellitus   . Diarrhea   . Diverticulitis   . Diverticulosis   . Dizziness   . Gastroparesis   . GERD (gastroesophageal reflux disease)   . History of colonic polyps    hyperplastic  . History of kidney stones   . History of seborrheic dermatitis    face,ears  . HLD (hyperlipidemia)   . Hypertension   . Intussusception ([place])   . Lumbar pain 2011   per x-ray - Multilevel spondylosis  . Morbid obesity ([place])   . Nephrolithiasis   . OSA on CPAP 2008  . Pneumonia   . Recurrent boils    "SLUSTER BOILS" ON ABD AND LABIA  . Sleep apnea    USES C-PAP  . Torn rotator cuff RT SHOULDER  . Tremor CCENTRAL NERVOUS SYSTEM   Past Surgical History:  Procedure Laterality Date  . CARPAL TUNNEL RELEASE Left 2006  . ELBOW SURGERY Left   . HAND SURGERY Right RIGHT  . JOINT REPLACEMENT Right    shoulder  . KNEE ARTHROSCOPY Left   . LUMBAR LAMINECTOMY    . MASS EXCISION  06/28/2012   Procedure: EXCISION MASS;  Surgeon: Harl Bowie, MD;  Location: [facility];  Service: General;  Laterality: Left;  excision left upper quadrant abdominal wall  mass, Benign Lipoma  . NASAL SINUS SURGERY    . PILONIDAL CYST EXCISION  [id number]  . TUBAL LIGATION     Social History   Tobacco Use  . Smoking status: Former Smoker    Types: Cigarettes    Last attempt to quit: 10/17/1996    Years since quitting: 20.9  . Smokeless tobacco: Never Used  Substance Use Topics  . Alcohol use: No     ROS:  As above   Medications: Current Outpatient Medications  Medication Sig Dispense Refill  . albuterol (PROAIR HFA) 108 (90 Base) MCG/ACT inhaler inhale 2 puffs by mouth every 6 hours if needed FOR WHEEZING OR SHORTNESS OF BREATH 8.5 g 5  . AMBULATORY NON FORMULARY MEDICATION Diabetic shoes  Dx: Diabetes, uncontrolled 250.60 with neuropathy. 1 Device 0  . AMBULATORY NON FORMULARY MEDICATION Test strips and needles for one touch Verio IQ glucometer.  Use to test blood sugar up to four times a day. Diagnosis Type 2 Diabetes 100 each 3  . AMBULATORY NON FORMULARY MEDICATION 4 prong cane DX R06.89 1 each 0  . azelastine (ASTELIN) 0.1 % nasal spray Place 2 sprays into both nostrils 2 (two) times daily. Use in each nostril as directed 30 mL 6  . DULoxetine (CYMBALTA) 60  MG capsule Take 2 capsules (120 mg total) by mouth daily. 60 capsule 5  . Fluticasone-Salmeterol (ADVAIR DISKUS) 250-50 MCG/DOSE AEPB inhale 1 puff by mouth twice a day 60 each 11  . furosemide (LASIX) 20 MG tablet Take 1 tablet (20 mg total) by mouth daily. 30 tablet 5  . gabapentin (NEURONTIN) 800 MG tablet Take 1 tablet (800 mg total) by mouth 3 (three) times daily. 90 tablet 5  . glucose blood (ONETOUCH VERIO) test strip Check blood sugar 3 times daily. Diagnosis: Type II diabetes mellitus with neurological manifestations (E11.49) 100 each 3  . HYDROcodone-acetaminophen (NORCO/VICODIN) 5-325 MG tablet TAKE 1 TO 2 TABLETS BY MOUTH TWICE DAILY. 60 tablet 0  . Insulin Glargine (LANTUS SOLOSTAR) 100 UNIT/ML Solostar Pen Inject 40 Units into the skin at bedtime. Please include QS 3 months. 4  pen 2  . Insulin Pen Needle (NOVOFINE) 32G X 6 MM MISC USE 3 TIMES A DAY WITH NOVOLOG 100 each 2  . Insulin Syringe-Needle U-100 (B-D INS SYR ULTRAFINE 1CC/30G) 30G X 1/2" 1 ML MISC use as directed three times a day 100 each 5  . levocetirizine (XYZAL) 5 MG tablet Take 1 tablet (5 mg total) by mouth every evening. 30 tablet 5  . lidocaine (LIDODERM) 5 % Place 1 patch onto the skin daily. 30 patch 1  . liraglutide (VICTOZA) 18 MG/3ML SOPN inject 1.8 milligram subcutaneously daily 6 mL 2  . LORazepam (ATIVAN) 0.5 MG tablet Take 1 tablet (0.5 mg total) by mouth every 8 (eight) hours as needed for anxiety. 20 tablet 0  . meclizine (ANTIVERT) 25 MG tablet Take 1 tablet (25 mg total) by mouth 3 (three) times daily as needed for dizziness. 30 tablet 0  . meloxicam (MOBIC) 15 MG tablet Take 1 tablet (15 mg total) by mouth daily. 30 tablet 1  . metFORMIN (GLUCOPHAGE) 1000 MG tablet Take 1 tablet (1,000 mg total) by mouth 2 (two) times daily with a meal. 60 tablet 5  . nystatin ointment (MYCOSTATIN) apply to affected area twice a day 60 g 0  . NYSTATIN powder APPLY EXTERNALLY TO THE AFFECTED AREA THREE TIMES DAILY AS DIRECTED 60 g prn  . Olopatadine HCl (PATADAY) 0.2 % SOLN instill 1 drop into both eyes twice a day if needed (Patient taking differently: Place 1 drop into both eyes daily at 6 (six) AM. instill 1 drop into both eyes twice a day if needed) 2.5 mL 3  . pioglitazone (ACTOS) 30 MG tablet Take 1 tablet (30 mg total) by mouth daily. 30 tablet 2  . propranolol (INDERAL) 20 MG tablet Take 1 tablet (20 mg total) by mouth 2 (two) times daily. 60 tablet 5   No current facility-administered medications for this visit.    Allergies  Allergen Reactions  . Ace Inhibitors Other (See Comments)    Causes blood pressure to drop rapidly  . Cefaclor Hives and Itching  . Shrimp [Shellfish Allergy] Nausea And Vomiting  . Ceclor [Cefaclor]   . Glyxambi [Empagliflozin-Linagliptin]     Itching/rash  . Sulfa  Antibiotics   . Sulfonamide Derivatives Rash     Exam:  BP (!) 143/71   Pulse 80   Ht 5\' 3"  (1.6 m)   Wt 272 lb (123.4 kg)   LMP 10/18/1995   BMI 48.18 kg/m  General: Well Developed, well nourished, and in no acute distress.  Neuro/Psych: Alert and oriented x3, extra-ocular muscles intact, able to move all 4 extremities, sensation grossly intact. Skin: Warm and  dry, no rashes noted.  Respiratory: Not using accessory muscles, speaking in full sentences, trachea midline.  Cardiovascular: Pulses palpable, no extremity edema. Abdomen: Does not appear distended. MSK: Right foot normal-appearing tender to palpation across the dorsal midfoot pulses capillary refill and sensation are intact.    No results found for this or any previous visit (from the past 48 hour(s)). No results found.    Assessment and Plan: 61 y.o. female with Right midfoot fractures.We tried on a postoperative shoe which the patient tolerated well. We'll use that for a few weeks and recheck in 3-4 weeks. Return sooner if needed.    No orders of the defined types were placed in this encounter.  No orders of the defined types were placed in this encounter.   Discussed warning signs or symptoms. Please see discharge instructions. Patient expresses understanding.

## 2017-09-20 ENCOUNTER — Ambulatory Visit: Payer: Self-pay | Admitting: Family Medicine

## 2017-09-21 NOTE — Telephone Encounter (Signed)
Lidocaine has been approved for coverage.effective dates are 10/15/2016 through 10/16/2018. Patient and pharm aware

## 2017-10-03 ENCOUNTER — Other Ambulatory Visit: Payer: Self-pay | Admitting: Physician Assistant

## 2017-10-03 DIAGNOSIS — E118 Type 2 diabetes mellitus with unspecified complications: Secondary | ICD-10-CM

## 2017-10-03 DIAGNOSIS — G249 Dystonia, unspecified: Secondary | ICD-10-CM

## 2017-10-03 DIAGNOSIS — Z794 Long term (current) use of insulin: Secondary | ICD-10-CM

## 2017-10-03 DIAGNOSIS — M545 Low back pain: Secondary | ICD-10-CM

## 2017-10-03 DIAGNOSIS — M542 Cervicalgia: Secondary | ICD-10-CM

## 2017-10-03 DIAGNOSIS — G894 Chronic pain syndrome: Secondary | ICD-10-CM

## 2017-10-04 ENCOUNTER — Encounter: Payer: Self-pay | Admitting: Family Medicine

## 2017-10-04 ENCOUNTER — Ambulatory Visit (INDEPENDENT_AMBULATORY_CARE_PROVIDER_SITE_OTHER): Payer: Medicare HMO | Admitting: Family Medicine

## 2017-10-04 ENCOUNTER — Ambulatory Visit (INDEPENDENT_AMBULATORY_CARE_PROVIDER_SITE_OTHER): Payer: Medicare HMO

## 2017-10-04 VITALS — BP 133/79 | HR 65 | Ht 63.0 in | Wt 277.0 lb

## 2017-10-04 DIAGNOSIS — M79671 Pain in right foot: Secondary | ICD-10-CM

## 2017-10-04 DIAGNOSIS — M25571 Pain in right ankle and joints of right foot: Secondary | ICD-10-CM

## 2017-10-04 MED ORDER — HYDROCODONE-ACETAMINOPHEN 5-325 MG PO TABS: ORAL_TABLET | ORAL | 0 refills | Status: DC

## 2017-10-04 MED ORDER — LORAZEPAM 0.5 MG PO TABS: 0.5000 mg | ORAL_TABLET | Freq: Three times a day (TID) | ORAL | 0 refills | Status: DC | PRN

## 2017-10-04 NOTE — Telephone Encounter (Signed)
Post dated controlled substance refills to 10/07/17.

## 2017-10-04 NOTE — Patient Instructions (Signed)
Thank you for coming in today. Get xray now.  We will likely or a CT scan and cast when results are back.  IN the mean time try to resume Cam [employer].

## 2017-10-05 ENCOUNTER — Encounter: Payer: Self-pay | Admitting: Family Medicine

## 2017-10-05 ENCOUNTER — Ambulatory Visit (INDEPENDENT_AMBULATORY_CARE_PROVIDER_SITE_OTHER): Payer: Medicare HMO | Admitting: Family Medicine

## 2017-10-05 VITALS — BP 129/71 | HR 70

## 2017-10-05 DIAGNOSIS — S93622A Sprain of tarsometatarsal ligament of left foot, initial encounter: Secondary | ICD-10-CM

## 2017-10-05 DIAGNOSIS — M9271 Juvenile osteochondrosis of metatarsus, right foot: Secondary | ICD-10-CM

## 2017-10-05 NOTE — Patient Instructions (Signed)
Thank you for coming in today. We will change casts weekly.  Recheck next week.  Return sooner if needed.  Use a walker and try to the keep weight off the foot.    Lisfranc Midfoot Injury A Lisfranc midfoot injury is a break (fracture), separation (dislocation), or sprain involving the bones and joints in the middle of your foot. Your midfoot is made up of a cluster of small bones (tarsals) that attach to the long bones going to your toes (metatarsals). Strong bands of tissue (ligaments) attach the bones of your midfoot to each other. A Lisfranc midfoot injury can include:  Ligament damage or tears.  Bone fractures.  Dislocations.  A combination of these injuries.  This type of injury can cause foot pain and instability. The condition can range from mild to severe. What are the causes? This condition may be caused by:  An injury that twists your foot forcefully.  Tripping or falling over your foot.  Falling from a height.  A hard, direct hit or a crushing injury to your foot.  What increases the risk? This condition is more likely to develop in athletes who participate in:  Contact sports, especially while wearing cleats, like in football.  Activities in which your foot is loaded in a pointed position like dance and gymnastics.  What are the signs or symptoms? Symptoms of this condition include:  Foot pain that gets worse with standing or walking.  Foot swelling.  Bruising on the top or bottom of the foot.  Pain when pressing on the top or bottom of the foot (tenderness).  Seeing or feeling a new bump on the top of your foot.  How is this diagnosed? This condition is diagnosed based on your symptoms and medical history, especially if you recently had an injury. Your health care provider will also do a physical exam to check for bruising under your foot andmove your toes to test for pain. You may be asked if you can stand up on tiptoe. You may also have imaging  studies done, including:  X-rays.  CT scans.  MRI.  How is this treated? The first treatments for ligament injuries that do not cause instability or dislocation of the midfoot are usually nonsurgical. These may include:  Using a boot or cast to keep your foot still and protect it while it heals (immobilization). You may need to wear this for at least 6 weeks or as told by your health care provider.  Using crutches to keep weight off your foot.  Physical therapy to improve motion and strength.  Medicine for pain.  Surgery is the treatment for fractures, dislocations, and unstable ligament injuries. This may include ligament repair, joint fusion, or a procedure to stabilize the fracture using screws or plates. After surgery, you will have to wear a cast and eventually have physical therapy. Follow these instructions at home: If you have a boot:  Wear the boot as told by your health care provider. Remove it only as told by your health care provider.  Loosen the boot if your toes tingle, become numb, or turn cold and blue.  Do not let your boot get wet if it is not waterproof.  Keep the boot clean. If you have a cast:  Do not stick anything inside the cast to scratch your skin. Doing that increases your risk of infection.  Check the skin around the cast every day. Tell your health care provider about any concerns.  You may put lotion on  dry skin around the edges of the cast. Do not put lotion on the skin underneath the cast.  Do not let your cast get wet if it is not waterproof.  Keep the cast clean. Bathing  Do not take baths, swim, or use a hot tub until your health care provider approves. Ask your health care provider if you can take showers. You may only be allowed to take sponge baths for bathing.  If your boot or cast is not waterproof, protect it with a watertight covering when you take a bath or a shower. Managing pain, stiffness, and swelling  If directed, apply  ice to the injured area. ? If you have a removable boot, remove it as told by your health care provider. ? Put ice in a plastic bag. ? Place a towel between the bag and your skin or between the bag and your cast. ? Leave the ice on for 20 minutes, 2-3 times a day.  Move your toes often to avoid stiffness and to lessen swelling.  Raise (elevate) the injured area above the level of your heart while you are sitting or lying down. Driving  Do not drive or operate heavy machinery while taking prescription pain medicine.  Ask your health care provider when it is safe to drive if you have a boot or cast on your foot. Activity  Return to your normal activities as told by your health care provider. Ask your health care provider what activities are safe for you.  Do exercises as told by your health care provider. Safety  Do not use the injured limb to support your body weight until your health care provider says that you can. Use your crutches as told by your health care provider. General instructions  Do not put pressure on any part of the cast until it is fully hardened. This may take several hours.  Do not use any tobacco products, such as cigarettes, chewing tobacco, and e-cigarettes. Tobacco can delay healing. If you need help quitting, ask your health care provider.  Take over-the-counter and prescription medicines only as told by your health care provider.  Keep all follow-up visits as told by your health care provider. This is important. How is this prevented?  Make sure to use equipment that fits you.  Use footwear that is appropriate for your athletic activity and the playing surface.  Be safe and responsible while being active to avoid falls. Contact a health care provider if:  Your pain medicine and rest are not helping. Get help right away if:  Your foot becomes very painful or numb.  Your toes become very pale or turn blue. This information is not intended to replace  advice given to you by your health care provider. Make sure you discuss any questions you have with your health care provider. Document Released: 10/03/2005 Document Revised: 06/07/2016 Document Reviewed: 06/17/2015 Elsevier Interactive Patient Education  [employer].

## 2017-10-05 NOTE — Progress Notes (Signed)
[name] Rice is a 61 y.o. female who presents to [facility] today for right foot pain.  [name] notes continued right foot pain.  She has been seen several times over the past several months for this.  She had negative x-rays initially and was discovered to have fractures at the second metatarsal base medial and middle cuneiform bones on MRI in late October.  She has continued to use a Cam walker boot since then.  She notes she has great difficulty getting a Cam walker boot on and notes it is starting to rub and become very painful.  She is tried to wear a postoperative shoe and more conventional shoes but notes that these do not help at all.  She notes the foot and ankle pain now are quite significant and quite obnoxious.  Past Medical History:  Diagnosis Date  . Anxiety   . Arthritis   . Asthma    no PFTs  . Cervical dystonia   . COPD (chronic obstructive pulmonary disease) (HCC)    no PFTs  . Depression   . Diabetes mellitus   . Diarrhea   . Diverticulitis   . Diverticulosis   . Dizziness   . Gastroparesis   . GERD (gastroesophageal reflux disease)   . History of colonic polyps    hyperplastic  . History of kidney stones   . History of seborrheic dermatitis    face,ears  . HLD (hyperlipidemia)   . Hypertension   . Intussusception ([place])   . Lumbar pain 2011   per x-ray - Multilevel spondylosis  . Morbid obesity ([place])   . Nephrolithiasis   . OSA on CPAP 2008  . Pneumonia   . Recurrent boils    "SLUSTER BOILS" ON ABD AND LABIA  . Sleep apnea    USES C-PAP  . Torn rotator cuff RT SHOULDER  . Tremor CCENTRAL NERVOUS SYSTEM   Past Surgical History:  Procedure Laterality Date  . CARPAL TUNNEL RELEASE Left 2006  . ELBOW SURGERY Left   . HAND SURGERY Right RIGHT  . JOINT REPLACEMENT Right    shoulder  . KNEE ARTHROSCOPY Left   . LUMBAR LAMINECTOMY    . MASS EXCISION  06/28/2012   Procedure: EXCISION MASS;  Surgeon: Harl Bowie, MD;  Location: [facility];  Service: General;  Laterality: Left;  excision left upper quadrant abdominal wall mass, Benign Lipoma  . NASAL SINUS SURGERY    . PILONIDAL CYST EXCISION  [id number]  . TUBAL LIGATION     Social History   Tobacco Use  . Smoking status: Former Smoker    Types: Cigarettes    Last attempt to quit: 10/17/1996    Years since quitting: 20.9  . Smokeless tobacco: Never Used  Substance Use Topics  . Alcohol use: No     ROS:  As above   Medications: Current Outpatient Medications  Medication Sig Dispense Refill  . albuterol (PROAIR HFA) 108 (90 Base) MCG/ACT inhaler inhale 2 puffs by mouth every 6 hours if needed FOR WHEEZING OR SHORTNESS OF BREATH 8.5 g 5  . AMBULATORY NON FORMULARY MEDICATION Diabetic shoes  Dx: Diabetes, uncontrolled 250.60 with neuropathy. 1 Device 0  . AMBULATORY NON FORMULARY MEDICATION Test strips and needles for one touch Verio IQ glucometer.  Use to test blood sugar up to four times a day. Diagnosis Type 2 Diabetes 100 each 3  . AMBULATORY NON FORMULARY MEDICATION 4 prong cane DX R06.89 1 each 0  .  azelastine (ASTELIN) 0.1 % nasal spray Place 2 sprays into both nostrils 2 (two) times daily. Use in each nostril as directed 30 mL 6  . DULoxetine (CYMBALTA) 60 MG capsule Take 2 capsules (120 mg total) by mouth daily. 60 capsule 5  . Fluticasone-Salmeterol (ADVAIR DISKUS) 250-50 MCG/DOSE AEPB inhale 1 puff by mouth twice a day 60 each 11  . furosemide (LASIX) 20 MG tablet Take 1 tablet (20 mg total) by mouth daily. 30 tablet 5  . gabapentin (NEURONTIN) 800 MG tablet Take 1 tablet (800 mg total) by mouth 3 (three) times daily. 90 tablet 5  . glucose blood (ONETOUCH VERIO) test strip Check blood sugar 3 times daily. Diagnosis: Type II diabetes mellitus with neurological manifestations (E11.49) 100 each 3  . HYDROcodone-acetaminophen (NORCO/VICODIN) 5-325 MG tablet TAKE 1 TO 2 TABLETS BY MOUTH TWICE DAILY. 60 tablet 0  . Insulin Glargine  (LANTUS SOLOSTAR) 100 UNIT/ML Solostar Pen Inject 40 Units into the skin at bedtime. Please include QS 3 months. 4 pen 2  . Insulin Pen Needle (NOVOFINE) 32G X 6 MM MISC USE 3 TIMES A DAY WITH NOVOLOG 100 each 2  . Insulin Syringe-Needle U-100 (B-D INS SYR ULTRAFINE 1CC/30G) 30G X 1/2" 1 ML MISC use as directed three times a day 100 each 5  . levocetirizine (XYZAL) 5 MG tablet Take 1 tablet (5 mg total) by mouth every evening. 30 tablet 5  . lidocaine (LIDODERM) 5 % Place 1 patch onto the skin daily. 30 patch 1  . liraglutide (VICTOZA) 18 MG/3ML SOPN inject 1.8 milligram subcutaneously daily 6 mL 2  . LORazepam (ATIVAN) 0.5 MG tablet Take 1 tablet (0.5 mg total) by mouth every 8 (eight) hours as needed for anxiety. 20 tablet 0  . meclizine (ANTIVERT) 25 MG tablet Take 1 tablet (25 mg total) by mouth 3 (three) times daily as needed for dizziness. 30 tablet 0  . meloxicam (MOBIC) 15 MG tablet Take 1 tablet (15 mg total) by mouth daily. 30 tablet 1  . metFORMIN (GLUCOPHAGE) 1000 MG tablet Take 1 tablet (1,000 mg total) by mouth 2 (two) times daily with a meal. 60 tablet 5  . nystatin ointment (MYCOSTATIN) apply to affected area twice a day 60 g 0  . NYSTATIN powder APPLY EXTERNALLY TO THE AFFECTED AREA THREE TIMES DAILY AS DIRECTED 60 g prn  . Olopatadine HCl (PATADAY) 0.2 % SOLN instill 1 drop into both eyes twice a day if needed (Patient taking differently: Place 1 drop into both eyes daily at 6 (six) AM. instill 1 drop into both eyes twice a day if needed) 2.5 mL 3  . pioglitazone (ACTOS) 30 MG tablet Take 1 tablet (30 mg total) by mouth daily. 30 tablet 2  . pioglitazone (ACTOS) 30 MG tablet TAKE 1 TABLET BY MOUTH ONCE DAILY 30 tablet 0  . propranolol (INDERAL) 20 MG tablet Take 1 tablet (20 mg total) by mouth 2 (two) times daily. 60 tablet 5   No current facility-administered medications for this visit.    Allergies  Allergen Reactions  . Ace Inhibitors Other (See Comments)    Causes blood  pressure to drop rapidly  . Cefaclor Hives and Itching  . Shrimp [Shellfish Allergy] Nausea And Vomiting  . Ceclor [Cefaclor]   . Glyxambi [Empagliflozin-Linagliptin]     Itching/rash  . Sulfa Antibiotics   . Sulfonamide Derivatives Rash     Exam:  BP 133/79   Pulse 65   Ht 5\' 3"  (1.6 m)  Wt 277 lb (125.6 kg)   LMP 10/18/1995   BMI 49.07 kg/m  General: Well Developed, well nourished, and in no acute distress.  Neuro/Psych: Alert and oriented x3, extra-ocular muscles intact, able to move all 4 extremities, sensation grossly intact. Skin: Warm and dry, no rashes noted.  Respiratory: Not using accessory muscles, speaking in full sentences, trachea midline.  Cardiovascular: Pulses palpable, no extremity edema. Abdomen: Does not appear distended. MSK: Right foot minimal swelling at the foot and ankle.  Diffusely tender across the dorsal midfoot.  Pulses and capillary refill and sensation are intact.    Assessment and Plan: 61 y.o. female with continued right foot and ankle pain.  At this point Lizann is not doing well with conservative management.  I think it is time to change what we have been doing.  She has poor mobility and will have difficulty tolerating nonweightbearing.  Plan to check a basic plain x-ray of the ankle and foot today and obtain a CT scan of the foot to follow for worsening fractures seen on MRI.  We will will probably need a transition to a walking cast if able.    Orders Placed This Encounter  Procedures  . DG Foot Complete Right    Standing Status:   Future    Number of Occurrences:   1    Standing Expiration Date:   12/05/2018    Order Specific Question:   Reason for Exam (SYMPTOM  OR DIAGNOSIS REQUIRED)    Answer:   eval persistant ankle and foot pain    Order Specific Question:   Preferred imaging location?    Answer:   [facility]    Order Specific Question:   Radiology Contrast Protocol - do NOT remove file path    Answer:    [email or web address]  . DG Ankle Complete Right    Standing Status:   Future    Number of Occurrences:   1    Standing Expiration Date:   12/05/2018    Order Specific Question:   Reason for Exam (SYMPTOM  OR DIAGNOSIS REQUIRED)    Answer:   eval persistant foot and ankle pain    Order Specific Question:   Preferred imaging location?    Answer:   [facility]    Order Specific Question:   Radiology Contrast Protocol - do NOT remove file path    Answer:   [email or web address]   No orders of the defined types were placed in this encounter.   Discussed warning signs or symptoms. Please see discharge instructions. Patient expresses understanding.  I spent 25 minutes with this patient, greater than 50% was face-to-face time counseling regarding ddx and treatment plan.

## 2017-10-06 ENCOUNTER — Encounter: Payer: Self-pay | Admitting: Family Medicine

## 2017-10-06 DIAGNOSIS — S93324A Dislocation of tarsometatarsal joint of right foot, initial encounter: Secondary | ICD-10-CM | POA: Insufficient documentation

## 2017-10-06 DIAGNOSIS — S93622A Sprain of tarsometatarsal ligament of left foot, initial encounter: Secondary | ICD-10-CM

## 2017-10-06 DIAGNOSIS — M9271 Juvenile osteochondrosis of metatarsus, right foot: Secondary | ICD-10-CM | POA: Insufficient documentation

## 2017-10-06 HISTORY — DX: Sprain of tarsometatarsal ligament of left foot, initial encounter: S93.622A

## 2017-10-06 NOTE — Progress Notes (Signed)
[name] Rice Rice a 61 y.o. female who presents to [facility] today for right foot pain.  [name] Rice originally suffered an injury to her right foot in September and [name] Rice has been seen multiple times for this issue.  X-rays were initially unremarkable and subsequent MRI showed cuneiform fractures and no obvious Lisfranc injury.  [name] Rice was managed with Cam Walker boot and weightbearing as tolerated.  [name] Rice failed to improve and I saw her yesterday where x-rays were obtained to the visit showing Lisfranc injury to her right foot as well as Freiberg's of the second metatarsal.  [name] Rice here today to discuss results and treatment plan.  [name] Rice notes that [name] Rice has difficulty using a walker for nonweightbearing.  [name] Rice does not wish to go to a nursing home.   Past Medical History:  Diagnosis Date  . Anxiety   . Arthritis   . Asthma    no PFTs  . Cervical dystonia   . COPD (chronic obstructive pulmonary disease) (HCC)    no PFTs  . Depression   . Diabetes mellitus   . Diarrhea   . Diverticulitis   . Diverticulosis   . Dizziness   . Gastroparesis   . GERD (gastroesophageal reflux disease)   . History of colonic polyps    hyperplastic  . History of kidney stones   . History of seborrheic dermatitis    face,ears  . HLD (hyperlipidemia)   . Hypertension   . Intussusception ([place])   . Lumbar pain 2011   per x-ray - Multilevel spondylosis  . Morbid obesity ([place])   . Nephrolithiasis   . OSA on CPAP 2008  . Pneumonia   . Recurrent boils    "SLUSTER BOILS" ON ABD AND LABIA  . Sleep apnea    USES C-PAP  . Torn rotator cuff RT SHOULDER  . Tremor CCENTRAL NERVOUS SYSTEM   Past Surgical History:  Procedure Laterality Date  . CARPAL TUNNEL RELEASE Left 2006  . ELBOW SURGERY Left   . HAND SURGERY Right RIGHT  . JOINT REPLACEMENT Right    shoulder  . KNEE ARTHROSCOPY Left   . LUMBAR LAMINECTOMY    . MASS EXCISION  06/28/2012   Procedure: EXCISION MASS;   Surgeon: Harl Bowie, MD;  Location: [facility];  Service: General;  Laterality: Left;  excision left upper quadrant abdominal wall mass, Benign Lipoma  . NASAL SINUS SURGERY    . PILONIDAL CYST EXCISION  [id number]  . TUBAL LIGATION     Social History   Tobacco Use  . Smoking status: Former Smoker    Types: Cigarettes    Last attempt to quit: 10/17/1996    Years since quitting: 20.9  . Smokeless tobacco: Never Used  Substance Use Topics  . Alcohol use: No     ROS:  As above   Medications: Current Outpatient Medications  Medication Sig Dispense Refill  . albuterol (PROAIR HFA) 108 (90 Base) MCG/ACT inhaler inhale 2 puffs by mouth every 6 hours if needed FOR WHEEZING OR SHORTNESS OF BREATH 8.5 g 5  . AMBULATORY NON FORMULARY MEDICATION Diabetic shoes  Dx: Diabetes, uncontrolled 250.60 with neuropathy. 1 Device 0  . AMBULATORY NON FORMULARY MEDICATION Test strips and needles for one touch Verio IQ glucometer.  Use to test blood sugar up to four times a day. Diagnosis Type 2 Diabetes 100 each 3  . AMBULATORY NON FORMULARY MEDICATION 4 prong cane DX R06.89 1 each 0  . azelastine (ASTELIN)  0.1 % nasal spray Place 2 sprays into both nostrils 2 (two) times daily. Use in each nostril as directed 30 mL 6  . DULoxetine (CYMBALTA) 60 MG capsule Take 2 capsules (120 mg total) by mouth daily. 60 capsule 5  . Fluticasone-Salmeterol (ADVAIR DISKUS) 250-50 MCG/DOSE AEPB inhale 1 puff by mouth twice a day 60 each 11  . furosemide (LASIX) 20 MG tablet Take 1 tablet (20 mg total) by mouth daily. 30 tablet 5  . gabapentin (NEURONTIN) 800 MG tablet Take 1 tablet (800 mg total) by mouth 3 (three) times daily. 90 tablet 5  . glucose blood (ONETOUCH VERIO) test strip Check blood sugar 3 times daily. Diagnosis: Type II diabetes mellitus with neurological manifestations (E11.49) 100 each 3  . HYDROcodone-acetaminophen (NORCO/VICODIN) 5-325 MG tablet TAKE 1 TO 2 TABLETS BY MOUTH TWICE DAILY. 60 tablet 0  .  Insulin Glargine (LANTUS SOLOSTAR) 100 UNIT/ML Solostar Pen Inject 40 Units into the skin at bedtime. Please include QS 3 months. 4 pen 2  . Insulin Pen Needle (NOVOFINE) 32G X 6 MM MISC USE 3 TIMES A DAY WITH NOVOLOG 100 each 2  . Insulin Syringe-Needle U-100 (B-D INS SYR ULTRAFINE 1CC/30G) 30G X 1/2" 1 ML MISC use as directed three times a day 100 each 5  . levocetirizine (XYZAL) 5 MG tablet Take 1 tablet (5 mg total) by mouth every evening. 30 tablet 5  . lidocaine (LIDODERM) 5 % Place 1 patch onto the skin daily. 30 patch 1  . liraglutide (VICTOZA) 18 MG/3ML SOPN inject 1.8 milligram subcutaneously daily 6 mL 2  . LORazepam (ATIVAN) 0.5 MG tablet Take 1 tablet (0.5 mg total) by mouth every 8 (eight) hours as needed for anxiety. 20 tablet 0  . meclizine (ANTIVERT) 25 MG tablet Take 1 tablet (25 mg total) by mouth 3 (three) times daily as needed for dizziness. 30 tablet 0  . meloxicam (MOBIC) 15 MG tablet Take 1 tablet (15 mg total) by mouth daily. 30 tablet 1  . metFORMIN (GLUCOPHAGE) 1000 MG tablet Take 1 tablet (1,000 mg total) by mouth 2 (two) times daily with a meal. 60 tablet 5  . nystatin ointment (MYCOSTATIN) apply to affected area twice a day 60 g 0  . NYSTATIN powder APPLY EXTERNALLY TO THE AFFECTED AREA THREE TIMES DAILY AS DIRECTED 60 g prn  . Olopatadine HCl (PATADAY) 0.2 % SOLN instill 1 drop into both eyes twice a day if needed (Patient taking differently: Place 1 drop into both eyes daily at 6 (six) AM. instill 1 drop into both eyes twice a day if needed) 2.5 mL 3  . pioglitazone (ACTOS) 30 MG tablet Take 1 tablet (30 mg total) by mouth daily. 30 tablet 2  . pioglitazone (ACTOS) 30 MG tablet TAKE 1 TABLET BY MOUTH ONCE DAILY 30 tablet 0  . propranolol (INDERAL) 20 MG tablet Take 1 tablet (20 mg total) by mouth 2 (two) times daily. 60 tablet 5   No current facility-administered medications for this visit.    Allergies  Allergen Reactions  . Ace Inhibitors Other (See Comments)     Causes blood pressure to drop rapidly  . Cefaclor Hives and Itching  . Shrimp [Shellfish Allergy] Nausea And Vomiting  . Ceclor [Cefaclor]   . Glyxambi [Empagliflozin-Linagliptin]     Itching/rash  . Sulfa Antibiotics   . Sulfonamide Derivatives Rash     Exam:  BP 129/71   Pulse 70   LMP 10/18/1995  General: Well Developed, well nourished, and  in no acute distress.  Neuro/Psych: Alert and oriented x3, extra-ocular muscles intact, able to move all 4 extremities, sensation grossly intact. Skin: Warm and dry, no rashes noted.  Respiratory: Not using accessory muscles, speaking in full sentences, trachea midline.  Cardiovascular: Pulses palpable, no extremity edema. Abdomen: Does not appear distended. MSK: Right foot tender to palpation across the dorsal midfoot.  No significant deformity present.  Pulses are intact.  Short leg cast applied right leg.   No results found for this or any previous visit (from the past 48 hour(s)). Dg Ankle Complete Right  Result Date: 10/04/2017 CLINICAL DATA:  Persistent right foot and ankle pain after injury 3 months prior. EXAM: RIGHT ANKLE - COMPLETE 3+ VIEW COMPARISON:  None. FINDINGS: Mild periosteal reaction involving the distal fibula proximal to the ankle mortise. No acute fracture. No tibiotalar joint effusion. Plantar calcaneal spur and tiny Achilles tendon enthesophyte. The alignment of the ankle and ankle mortise are preserved. Soft tissues are unremarkable. IMPRESSION: Mild periosteal reaction involving the distal fibula proximal to the ankle mortise, probable healing stress fracture/stress reaction. No fracture lucency Rice visualized. Electronically Signed   By: Jeb Levering M.D.   On: 10/04/2017 21:59   Dg Foot Complete Right  Result Date: 10/04/2017 CLINICAL DATA:  Persistent right foot and ankle pain after injury 3 months prior. EXAM: RIGHT FOOT COMPLETE - 3+ VIEW COMPARISON:  Radiographs 07/11/2017.  MRI 08/12/2017 FINDINGS: There Rice  widening of the Lisfranc reticulation with lateral subluxation of the second through fifth metatarsals with respect to the first ray. Minimal osseous remottling about the proximal second metatarsal may be secondary to fracture as seen on prior MRI. No fracture lucency or periosteal reaction. Vague cystic change involving the medial aspect of the medial cuneiform at site of fracture on MRI. There Rice new flattening of the second metatarsal head. No acute fracture. The bones are under mineralized. There Rice a plantar calcaneal spur and Achilles tendon enthesophyte. Soft tissue prominence over the dorsum of the foot may be habitus or edema. IMPRESSION: 1. Findings consistent with Lisfranc divergent Lisfranc injury with lateral subluxation of the second through fifth metatarsals with respect to the first ray, progressed from prior radiograph. 2. New flattening of the second metatarsal head suspicious for avascular necrosis/Freiberg's infraction. 3. Mild osseous remottling at the base of the second metatarsal and possibly medial cuneiform at site or fractures on prior MRI. Electronically Signed   By: Jeb Levering M.D.   On: 10/04/2017 21:55      Assessment and Plan: 61 y.o. female with  Lisfranc injury right foot.  This was not initially recognized with any of the imaging studies we did.  Ideally this should be managed with casting and nonweightbearing.  However I do not think [name] Rice Rice going to be able to completely nonweightbearing with her right foot.  [name] Rice lacks the upper arm strength.  Additionally [name] Rice Rice not a good surgical candidate.  [name] Rice has multiple other medical problems.  After lengthy discussion we plan for serial casting with limited weightbearing and watchful waiting.  I do however think it Rice worth a consultation with podiatry as there may be a more minimally invasive surgery that [name] Rice could benefit from.  Refer to podiatry with the expectation that I will continue serial casting and management.   Additionally will cast weekly because [name] Rice has diabetes and I am worried about pressure ulcers inside the cast.  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  We will try  to manage this also with casting although this also Rice going to be difficult to treat.    No orders of the defined types were placed in this encounter.  No orders of the defined types were placed in this encounter.   Discussed warning signs or symptoms. Please see discharge instructions. Patient expresses understanding.

## 2017-10-13 ENCOUNTER — Ambulatory Visit (INDEPENDENT_AMBULATORY_CARE_PROVIDER_SITE_OTHER): Payer: Medicare HMO | Admitting: Family Medicine

## 2017-10-13 ENCOUNTER — Encounter: Payer: Self-pay | Admitting: Family Medicine

## 2017-10-13 VITALS — BP 130/81 | HR 74

## 2017-10-13 DIAGNOSIS — M9271 Juvenile osteochondrosis of metatarsus, right foot: Secondary | ICD-10-CM

## 2017-10-13 DIAGNOSIS — M545 Low back pain: Secondary | ICD-10-CM | POA: Diagnosis not present

## 2017-10-13 DIAGNOSIS — G894 Chronic pain syndrome: Secondary | ICD-10-CM

## 2017-10-13 DIAGNOSIS — M542 Cervicalgia: Secondary | ICD-10-CM | POA: Diagnosis not present

## 2017-10-13 DIAGNOSIS — S93622A Sprain of tarsometatarsal ligament of left foot, initial encounter: Secondary | ICD-10-CM

## 2017-10-13 MED ORDER — HYDROCODONE-ACETAMINOPHEN 5-325 MG PO TABS: ORAL_TABLET | ORAL | 0 refills | Status: DC

## 2017-10-13 NOTE — Patient Instructions (Signed)
Thank you for coming in today. You should hear form Podiatry.  Recheck in 1 week for cast change.  Return sooner if needed.  Try to keep your weight off the right foot if able.

## 2017-10-13 NOTE — Progress Notes (Signed)
[name] Rice is a 61 y.o. female who presents to [facility] today for follow-up foot pain.  [name] Rice was seen several times for foot injury was recently about a week ago where she was diagnosed with a Lisfranc injury to the right foot.  We transition to casting and nonweightbearing if able.  She is here today for cast change.  She is high risk for pressure sores so were doing more frequent cath changes.  She notes she has been doing pretty okay with if pain better and worse at times.   Past Medical History:  Diagnosis Date  . Anxiety   . Arthritis   . Asthma    no PFTs  . Cervical dystonia   . COPD (chronic obstructive pulmonary disease) (HCC)    no PFTs  . Depression   . Diabetes mellitus   . Diarrhea   . Diverticulitis   . Diverticulosis   . Dizziness   . Gastroparesis   . GERD (gastroesophageal reflux disease)   . History of colonic polyps    hyperplastic  . History of kidney stones   . History of seborrheic dermatitis    face,ears  . HLD (hyperlipidemia)   . Hypertension   . Intussusception ([place])   . Lisfranc's sprain, left, initial encounter 10/06/2017  . Lumbar pain 2011   per x-ray - Multilevel spondylosis  . Morbid obesity ([place])   . Nephrolithiasis   . OSA on CPAP 2008  . Pneumonia   . Recurrent boils    "SLUSTER BOILS" ON ABD AND LABIA  . Sleep apnea    USES C-PAP  . Torn rotator cuff RT SHOULDER  . Tremor CCENTRAL NERVOUS SYSTEM   Past Surgical History:  Procedure Laterality Date  . CARPAL TUNNEL RELEASE Left 2006  . ELBOW SURGERY Left   . HAND SURGERY Right RIGHT  . JOINT REPLACEMENT Right    shoulder  . KNEE ARTHROSCOPY Left   . LUMBAR LAMINECTOMY    . MASS EXCISION  06/28/2012   Procedure: EXCISION MASS;  Surgeon: Harl Bowie, MD;  Location: [facility];  Service: General;  Laterality: Left;  excision left upper quadrant abdominal wall mass, Benign Lipoma  . NASAL SINUS SURGERY    . PILONIDAL CYST  EXCISION  [id number]  . TUBAL LIGATION     Social History   Tobacco Use  . Smoking status: Former Smoker    Types: Cigarettes    Last attempt to quit: 10/17/1996    Years since quitting: 21.0  . Smokeless tobacco: Never Used  Substance Use Topics  . Alcohol use: No     ROS:  As above   Medications: Current Outpatient Medications  Medication Sig Dispense Refill  . albuterol (PROAIR HFA) 108 (90 Base) MCG/ACT inhaler inhale 2 puffs by mouth every 6 hours if needed FOR WHEEZING OR SHORTNESS OF BREATH 8.5 g 5  . AMBULATORY NON FORMULARY MEDICATION Diabetic shoes  Dx: Diabetes, uncontrolled 250.60 with neuropathy. 1 Device 0  . AMBULATORY NON FORMULARY MEDICATION Test strips and needles for one touch Verio IQ glucometer.  Use to test blood sugar up to four times a day. Diagnosis Type 2 Diabetes 100 each 3  . AMBULATORY NON FORMULARY MEDICATION 4 prong cane DX R06.89 1 each 0  . azelastine (ASTELIN) 0.1 % nasal spray Place 2 sprays into both nostrils 2 (two) times daily. Use in each nostril as directed 30 mL 6  . DULoxetine (CYMBALTA) 60 MG capsule Take 2  capsules (120 mg total) by mouth daily. 60 capsule 5  . Fluticasone-Salmeterol (ADVAIR DISKUS) 250-50 MCG/DOSE AEPB inhale 1 puff by mouth twice a day 60 each 11  . furosemide (LASIX) 20 MG tablet Take 1 tablet (20 mg total) by mouth daily. 30 tablet 5  . gabapentin (NEURONTIN) 800 MG tablet Take 1 tablet (800 mg total) by mouth 3 (three) times daily. 90 tablet 5  . glucose blood (ONETOUCH VERIO) test strip Check blood sugar 3 times daily. Diagnosis: Type II diabetes mellitus with neurological manifestations (E11.49) 100 each 3  . HYDROcodone-acetaminophen (NORCO/VICODIN) 5-325 MG tablet TAKE 1 TO 2 TABLETS BY MOUTH three times DAILY as needed 60 tablet 0  . Insulin Glargine (LANTUS SOLOSTAR) 100 UNIT/ML Solostar Pen Inject 40 Units into the skin at bedtime. Please include QS 3 months. 4 pen 2  . Insulin Pen Needle (NOVOFINE) 32G X 6 MM  MISC USE 3 TIMES A DAY WITH NOVOLOG 100 each 2  . Insulin Syringe-Needle U-100 (B-D INS SYR ULTRAFINE 1CC/30G) 30G X 1/2" 1 ML MISC use as directed three times a day 100 each 5  . levocetirizine (XYZAL) 5 MG tablet Take 1 tablet (5 mg total) by mouth every evening. 30 tablet 5  . liraglutide (VICTOZA) 18 MG/3ML SOPN inject 1.8 milligram subcutaneously daily 6 mL 2  . LORazepam (ATIVAN) 0.5 MG tablet Take 1 tablet (0.5 mg total) by mouth every 8 (eight) hours as needed for anxiety. 20 tablet 0  . meclizine (ANTIVERT) 25 MG tablet Take 1 tablet (25 mg total) by mouth 3 (three) times daily as needed for dizziness. 30 tablet 0  . meloxicam (MOBIC) 15 MG tablet Take 1 tablet (15 mg total) by mouth daily. 30 tablet 1  . metFORMIN (GLUCOPHAGE) 1000 MG tablet Take 1 tablet (1,000 mg total) by mouth 2 (two) times daily with a meal. 60 tablet 5  . nystatin ointment (MYCOSTATIN) apply to affected area twice a day 60 g 0  . NYSTATIN powder APPLY EXTERNALLY TO THE AFFECTED AREA THREE TIMES DAILY AS DIRECTED 60 g prn  . Olopatadine HCl (PATADAY) 0.2 % SOLN instill 1 drop into both eyes twice a day if needed (Patient taking differently: Place 1 drop into both eyes daily at 6 (six) AM. instill 1 drop into both eyes twice a day if needed) 2.5 mL 3  . pioglitazone (ACTOS) 30 MG tablet Take 1 tablet (30 mg total) by mouth daily. 30 tablet 2  . pioglitazone (ACTOS) 30 MG tablet TAKE 1 TABLET BY MOUTH ONCE DAILY 30 tablet 0  . propranolol (INDERAL) 20 MG tablet Take 1 tablet (20 mg total) by mouth 2 (two) times daily. 60 tablet 5   No current facility-administered medications for this visit.    Allergies  Allergen Reactions  . Ace Inhibitors Other (See Comments)    Causes blood pressure to drop rapidly  . Cefaclor Hives and Itching  . Shrimp [Shellfish Allergy] Nausea And Vomiting  . Ceclor [Cefaclor]   . Glyxambi [Empagliflozin-Linagliptin]     Itching/rash  . Sulfa Antibiotics   . Sulfonamide Derivatives  Rash     Exam:  BP 130/81   Pulse 74   LMP 10/18/1995  General: Well Developed, well nourished, and in no acute distress.  Neuro/Psych: Alert and oriented x3, extra-ocular muscles intact, able to move all 4 extremities, sensation grossly intact. Skin: Warm and dry, no rashes noted.  Respiratory: Not using accessory muscles, speaking in full sentences, trachea midline.  Cardiovascular: Pulses palpable,  no extremity edema. Abdomen: Does not appear distended. MSK: Right foot normal except for a bruised area at the base of the third toe at the MTP.  There is a small area of skin erythema at the fifth toe as well. Tender to palpation across the dorsal midfoot.  Pulses and capillary refill are intact.  A new well-padded short leg cast was applied to the right leg    No results found for this or any previous visit (from the past 48 hour(s)). No results found.    Assessment and Plan: 61 y.o. female with  Right foot Lisfranc injury.  Casting today.  Patient is at risk for developing pressure sores.  Provided extra padding across the dorsal aspect of her MTPs to better pad this area.  Recheck in 1 week we will change the cast.  Limited Norco for pain control    No orders of the defined types were placed in this encounter.  Meds ordered this encounter  Medications  . HYDROcodone-acetaminophen (NORCO/VICODIN) 5-325 MG tablet    Sig: TAKE 1 TO 2 TABLETS BY MOUTH three times DAILY as needed    Dispense:  60 tablet    Refill:  0    Discussed warning signs or symptoms. Please see discharge instructions. Patient expresses understanding.  I spent 25 minutes with this patient, greater than 50% was face-to-face time counseling regarding ddx and treatment plan.

## 2017-10-20 ENCOUNTER — Ambulatory Visit (INDEPENDENT_AMBULATORY_CARE_PROVIDER_SITE_OTHER): Payer: Medicare HMO | Admitting: Physician Assistant

## 2017-10-20 ENCOUNTER — Ambulatory Visit (INDEPENDENT_AMBULATORY_CARE_PROVIDER_SITE_OTHER): Payer: Medicare HMO | Admitting: Family Medicine

## 2017-10-20 ENCOUNTER — Encounter: Payer: Self-pay | Admitting: Family Medicine

## 2017-10-20 ENCOUNTER — Other Ambulatory Visit: Payer: Self-pay | Admitting: Physician Assistant

## 2017-10-20 ENCOUNTER — Encounter: Payer: Self-pay | Admitting: Physician Assistant

## 2017-10-20 VITALS — BP 115/76 | HR 72

## 2017-10-20 VITALS — BP 115/65 | HR 69

## 2017-10-20 DIAGNOSIS — M9271 Juvenile osteochondrosis of metatarsus, right foot: Secondary | ICD-10-CM | POA: Diagnosis not present

## 2017-10-20 DIAGNOSIS — F331 Major depressive disorder, recurrent, moderate: Secondary | ICD-10-CM

## 2017-10-20 DIAGNOSIS — Z794 Long term (current) use of insulin: Secondary | ICD-10-CM | POA: Diagnosis not present

## 2017-10-20 DIAGNOSIS — E118 Type 2 diabetes mellitus with unspecified complications: Secondary | ICD-10-CM

## 2017-10-20 DIAGNOSIS — S93622A Sprain of tarsometatarsal ligament of left foot, initial encounter: Secondary | ICD-10-CM | POA: Diagnosis not present

## 2017-10-20 DIAGNOSIS — E1142 Type 2 diabetes mellitus with diabetic polyneuropathy: Secondary | ICD-10-CM

## 2017-10-20 DIAGNOSIS — R69 Illness, unspecified: Secondary | ICD-10-CM | POA: Diagnosis not present

## 2017-10-20 LAB — POCT GLYCOSYLATED HEMOGLOBIN (HGB A1C): HEMOGLOBIN A1C: 6.2

## 2017-10-20 MED ORDER — LIRAGLUTIDE 18 MG/3ML ~~LOC~~ SOPN: PEN_INJECTOR | SUBCUTANEOUS | 2 refills | Status: DC

## 2017-10-20 MED ORDER — PIOGLITAZONE HCL 30 MG PO TABS: 30.0000 mg | ORAL_TABLET | Freq: Every day | ORAL | 2 refills | Status: DC

## 2017-10-20 MED ORDER — INSULIN GLARGINE 100 UNIT/ML SOLOSTAR PEN: 40.0000 [IU] | PEN_INJECTOR | Freq: Every day | SUBCUTANEOUS | 2 refills | Status: DC

## 2017-10-20 MED ORDER — BUPROPION HCL ER (XL) 150 MG PO TB24: 150.0000 mg | ORAL_TABLET | Freq: Every day | ORAL | 2 refills | Status: DC

## 2017-10-20 NOTE — Patient Instructions (Signed)
Thank you for coming in today. Recheck cast change in 1 week.  Return sooner if needed.

## 2017-10-20 NOTE — Patient Instructions (Signed)
Sugars doing better. Stay on same medications. Follow up in 3 months.  Added wellbutrin to cymbalta for mood. Follow up in 3 months.

## 2017-10-20 NOTE — Progress Notes (Signed)
[name] Rice is a 62 y.o. female who presents to [facility] today for Lisfranc injury right foot.  [name] Rice has been seen several times now for foot pain surgically found to be a Lisfranc injury.  She has been treated with intermittent short leg cast.  She was seen last week and at that time she had a bruise or early grade 1 pressure injury to the dorsal aspect of her third MTP.  She notes that she no longer is having any pain at the MTPs but continues to have some pain at the dorsal midfoot near the Lisfranc joint.  She notes this pain is improving however.   Past Medical History:  Diagnosis Date  . Anxiety   . Arthritis   . Asthma    no PFTs  . Cervical dystonia   . COPD (chronic obstructive pulmonary disease) (HCC)    no PFTs  . Depression   . Diabetes mellitus   . Diarrhea   . Diverticulitis   . Diverticulosis   . Dizziness   . Gastroparesis   . GERD (gastroesophageal reflux disease)   . History of colonic polyps    hyperplastic  . History of kidney stones   . History of seborrheic dermatitis    face,ears  . HLD (hyperlipidemia)   . Hypertension   . Intussusception ([place])   . Lisfranc's sprain, left, initial encounter 10/06/2017  . Lumbar pain 2011   per x-ray - Multilevel spondylosis  . Morbid obesity ([place])   . Nephrolithiasis   . OSA on CPAP 2008  . Pneumonia   . Recurrent boils    "SLUSTER BOILS" ON ABD AND LABIA  . Sleep apnea    USES C-PAP  . Torn rotator cuff RT SHOULDER  . Tremor CCENTRAL NERVOUS SYSTEM   Past Surgical History:  Procedure Laterality Date  . CARPAL TUNNEL RELEASE Left 2006  . ELBOW SURGERY Left   . HAND SURGERY Right RIGHT  . JOINT REPLACEMENT Right    shoulder  . KNEE ARTHROSCOPY Left   . LUMBAR LAMINECTOMY    . MASS EXCISION  06/28/2012   Procedure: EXCISION MASS;  Surgeon: Harl Bowie, MD;  Location: [facility];  Service: General;  Laterality: Left;  excision left upper quadrant  abdominal wall mass, Benign Lipoma  . NASAL SINUS SURGERY    . PILONIDAL CYST EXCISION  [id number]  . TUBAL LIGATION     Social History   Tobacco Use  . Smoking status: Former Smoker    Types: Cigarettes    Last attempt to quit: 10/17/1996    Years since quitting: 21.0  . Smokeless tobacco: Never Used  Substance Use Topics  . Alcohol use: No     ROS:  As above   Medications: Current Outpatient Medications  Medication Sig Dispense Refill  . albuterol (PROAIR HFA) 108 (90 Base) MCG/ACT inhaler inhale 2 puffs by mouth every 6 hours if needed FOR WHEEZING OR SHORTNESS OF BREATH 8.5 g 5  . AMBULATORY NON FORMULARY MEDICATION Diabetic shoes  Dx: Diabetes, uncontrolled 250.60 with neuropathy. 1 Device 0  . AMBULATORY NON FORMULARY MEDICATION Test strips and needles for one touch Verio IQ glucometer.  Use to test blood sugar up to four times a day. Diagnosis Type 2 Diabetes 100 each 3  . AMBULATORY NON FORMULARY MEDICATION 4 prong cane DX R06.89 1 each 0  . azelastine (ASTELIN) 0.1 % nasal spray Place 2 sprays into both nostrils 2 (two) times daily. Use  in each nostril as directed 30 mL 6  . buPROPion (WELLBUTRIN XL) 150 MG 24 hr tablet Take 1 tablet (150 mg total) by mouth daily. 30 tablet 2  . DULoxetine (CYMBALTA) 60 MG capsule Take 2 capsules (120 mg total) by mouth daily. 60 capsule 5  . Fluticasone-Salmeterol (ADVAIR DISKUS) 250-50 MCG/DOSE AEPB inhale 1 puff by mouth twice a day 60 each 11  . furosemide (LASIX) 20 MG tablet Take 1 tablet (20 mg total) by mouth daily. 30 tablet 5  . gabapentin (NEURONTIN) 800 MG tablet Take 1 tablet (800 mg total) by mouth 3 (three) times daily. 90 tablet 5  . glucose blood (ONETOUCH VERIO) test strip Check blood sugar 3 times daily. Diagnosis: Type II diabetes mellitus with neurological manifestations (E11.49) 100 each 3  . HYDROcodone-acetaminophen (NORCO/VICODIN) 5-325 MG tablet TAKE 1 TO 2 TABLETS BY MOUTH three times DAILY as needed 60 tablet 0    . Insulin Glargine (LANTUS SOLOSTAR) 100 UNIT/ML Solostar Pen Inject 40 Units into the skin at bedtime. Please include QS 3 months. 4 pen 2  . Insulin Pen Needle (NOVOFINE) 32G X 6 MM MISC USE 3 TIMES A DAY WITH NOVOLOG 100 each 2  . Insulin Syringe-Needle U-100 (B-D INS SYR ULTRAFINE 1CC/30G) 30G X 1/2" 1 ML MISC use as directed three times a day 100 each 5  . levocetirizine (XYZAL) 5 MG tablet Take 1 tablet (5 mg total) by mouth every evening. 30 tablet 5  . liraglutide (VICTOZA) 18 MG/3ML SOPN inject 1.8 milligram subcutaneously daily 6 mL 2  . LORazepam (ATIVAN) 0.5 MG tablet Take 1 tablet (0.5 mg total) by mouth every 8 (eight) hours as needed for anxiety. 20 tablet 0  . meclizine (ANTIVERT) 25 MG tablet Take 1 tablet (25 mg total) by mouth 3 (three) times daily as needed for dizziness. 30 tablet 0  . meloxicam (MOBIC) 15 MG tablet Take 1 tablet (15 mg total) by mouth daily. 30 tablet 1  . metFORMIN (GLUCOPHAGE) 1000 MG tablet Take 1 tablet (1,000 mg total) by mouth 2 (two) times daily with a meal. 60 tablet 5  . nystatin ointment (MYCOSTATIN) apply to affected area twice a day 60 g 0  . NYSTATIN powder APPLY EXTERNALLY TO THE AFFECTED AREA THREE TIMES DAILY AS DIRECTED 60 g prn  . Olopatadine HCl (PATADAY) 0.2 % SOLN instill 1 drop into both eyes twice a day if needed (Patient taking differently: Place 1 drop into both eyes daily at 6 (six) AM. instill 1 drop into both eyes twice a day if needed) 2.5 mL 3  . pioglitazone (ACTOS) 30 MG tablet TAKE 1 TABLET BY MOUTH ONCE DAILY 30 tablet 0  . pioglitazone (ACTOS) 30 MG tablet Take 1 tablet (30 mg total) by mouth daily. 30 tablet 2  . propranolol (INDERAL) 20 MG tablet Take 1 tablet (20 mg total) by mouth 2 (two) times daily. 60 tablet 5   No current facility-administered medications for this visit.    Allergies  Allergen Reactions  . Ace Inhibitors Other (See Comments)    Causes blood pressure to drop rapidly  . Cefaclor Hives and Itching   . Shrimp [Shellfish Allergy] Nausea And Vomiting  . Ceclor [Cefaclor]   . Glyxambi [Empagliflozin-Linagliptin]     Itching/rash  . Sulfa Antibiotics   . Sulfonamide Derivatives Rash     Exam:  BP 115/65   Pulse 69   LMP 10/18/1995  General: Well Developed, well nourished, and in no acute distress.  Neuro/Psych: Alert and oriented x3, extra-ocular muscles intact, able to move all 4 extremities, sensation grossly intact. Skin: Warm and dry, no rashes noted.  Respiratory: Not using accessory muscles, speaking in full sentences, trachea midline.  Cardiovascular: Pulses palpable, no extremity edema. Abdomen: Does not appear distended. MSK: Right foot well-appearing with no further bruises or ecchymosis at the dorsal third MTP.  She has a small red area at the fifth toe unchanged from last week.  A new short leg cast was applied      Assessment and Plan: 62 y.o. female with  Right foot Lisfranc injury.  Plan for continued casting.  Will recheck next week.  If no further pressure wounds or skin breakdown present we may stretch out the cast every 2 weeks.    No orders of the defined types were placed in this encounter.  No orders of the defined types were placed in this encounter.   Discussed warning signs or symptoms. Please see discharge instructions. Patient expresses understanding.

## 2017-10-20 NOTE — Progress Notes (Signed)
Subjective:    Patient ID: [name] Rice, female    DOB: Jan 22, 1956, 62 y.o.   MRN: [id number]  HPI Pt is a 62 yo morbidly obese female with Type II DM and peripheral neuropathy who presents today for follow up and medication refills.   DM-she is taking 40 units of lantus at night. Victoza, metformin and actos. No open sores or wounds. Denies hypoglyecmia.   Pt does c/o more crying and depression. She is being treated for a slow healing fracture and she is finding it hard to take care of herself. She sits in her room all day. She struggles to see the TV. She just feels like "nothing is going right". She is also having a lot of money troubles.   .. Active Ambulatory Problems    Diagnosis Date Noted  . Type II or unspecified type diabetes mellitus with neurological manifestations, not stated as uncontrolled(250.60) 08/08/2006  . DYSLIPIDEMIA 08/08/2006  . Depression with anxiety 01/28/2008  . Essential hypertension 08/08/2006  . COPD 08/08/2006  . GERD 08/08/2006  . Acute bilateral low back pain 11/27/2007  . BPPV (benign paroxysmal positional vertigo), unspecified laterality 07/14/2009  . OSA on CPAP   . Insomnia 02/06/2011  . Preventative health care 08/16/2011  . History of allergic rhinitis 09/07/2011  . Iron deficiency anemia 03/29/2012  . Lipoma of abdominal wall 05/01/2012  . Intestinal bacterial overgrowth 09/20/2012  . DOE (dyspnea on exertion) 03/31/2013  . Hepatic steatosis 09/26/2013  . Diverticulitis 11/27/2013  . Diabetic peripheral neuropathy ([place]) 05/30/2014  . Gastroparesis due to secondary diabetes ([place]) 09/16/2014  . Ear pain 12/09/2014  . COPD (chronic obstructive pulmonary disease) with chronic bronchitis ([place]) 12/09/2014  . Type 2 diabetes mellitus with complication ([place]) 78/29/5621  . Diarrhea 01/12/2015  . Non-proliferative diabetic retinopathy, right eye ([place]) 11/23/2015  . Chronic pain syndrome 12/15/2015  . Morbid obesity due to excess calories ([place])  02/16/2016  . Right shoulder pain 02/16/2016  . Unstable balance 02/16/2016  . Intertrigo 03/15/2016  . Left knee pain 03/15/2016  . Post-menopausal 03/16/2016  . Acute reaction to stress 03/28/2016  . Dystonia 09/21/2016  . Neck pain 07/11/2017  . Lisfranc's sprain, left, initial encounter 10/06/2017  . Freiberg's disease, right 10/06/2017   Resolved Ambulatory Problems    Diagnosis Date Noted  . IRON DEFICIENCY 01/30/2007  . DEPRESSION 08/08/2006  . TORTICOLLIS, SPASMODIC 02/13/2007  . SINUSITIS, ACUTE 05/27/2009  . ALLERGIC RHINITIS 12/25/2006  . ASTHMA 01/28/2008  . DIVERTICULOSIS, COLON 02/12/2003  . DERMATITIS, SEBORRHEIC 01/30/2007  . GOUT, HX OF 08/08/2006  . PERSONAL HX COLONIC POLYPS 09/16/2008  . NEPHROLITHIASIS, HX OF 01/28/2008  . Calf pain 11/29/2010  . Tremor, essential 02/06/2011  . Seborrheic dermatitis, unspecified 04/06/2011  . Finger pain 04/06/2011  . Shoulder pain, right 08/16/2011  . Left leg pain 04/10/2012  . Right ear pain 05/01/2012  . Seborrheic dermatitis 05/01/2012  . Hypoglycemia 05/01/2012  . Vaginal candidiasis 08/22/2012  . Sigmoid diverticulitis 09/15/2012  . Acute-on-chronic kidney disease Stage II 09/15/2012  . Cyst, breast 03/01/2013  . Lactic acidosis 03/31/2013  . Acute renal insufficiency 03/31/2013  . Abdominal pain, left lower quadrant 04/29/2013  . Gout 05/01/2013  . Fall 03/16/2016  . Foot fracture, right, closed, initial encounter 08/23/2017   Past Medical History:  Diagnosis Date  . Anxiety   . Arthritis   . Asthma   . Cervical dystonia   . COPD (chronic obstructive pulmonary disease) ([place])   . Depression   .  Diabetes mellitus   . Diarrhea   . Diverticulitis   . Diverticulosis   . Dizziness   . Gastroparesis   . GERD (gastroesophageal reflux disease)   . History of colonic polyps   . History of kidney stones   . History of seborrheic dermatitis   . HLD (hyperlipidemia)   . Hypertension   . Intussusception  ([place])   . Lisfranc's sprain, left, initial encounter 10/06/2017  . Lumbar pain 2011  . Morbid obesity ([place])   . Nephrolithiasis   . OSA on CPAP 2008  . Pneumonia   . Recurrent boils   . Sleep apnea   . Torn rotator cuff RT SHOULDER  . Tremor [place]     Review of Systems  All other systems reviewed and are negative.      Objective:   Physical Exam  Constitutional: She is oriented to person, place, and time. She appears well-developed and well-nourished.  Morbidly obese.   HENT:  Head: Normocephalic and atraumatic.  Cardiovascular: Normal rate, regular rhythm and normal heart sounds.  Pulmonary/Chest: Effort normal and breath sounds normal.  Musculoskeletal:  In a cast right foot.   Neurological: She is alert and oriented to person, place, and time.  Psychiatric: Her behavior is normal.  Tearful.           Assessment & Plan:   Marland KitchenMarland KitchenEna was seen today for diabetes.  Diagnoses and all orders for this visit:  Type 2 diabetes mellitus with complication, with long-term current use of insulin (HCC) -     POCT HgB A1C -     pioglitazone (ACTOS) 30 MG tablet; Take 1 tablet (30 mg total) by mouth daily.  Diabetic peripheral neuropathy (HCC) -     Insulin Glargine (LANTUS SOLOSTAR) 100 UNIT/ML Solostar Pen; Inject 40 Units into the skin at bedtime. Please include QS 3 months. -     liraglutide (VICTOZA) 18 MG/3ML SOPN; inject 1.8 milligram subcutaneously daily -     pioglitazone (ACTOS) 30 MG tablet; Take 1 tablet (30 mg total) by mouth daily.  Moderate episode of recurrent major depressive disorder (HCC) -     buPROPion (WELLBUTRIN XL) 150 MG 24 hr tablet; Take 1 tablet (150 mg total) by mouth daily.   .. Depression screen [facility] 2/9 10/20/2017 12/20/2016 04/29/2013 04/08/2013 03/01/2013  Decreased Interest 2 3 0 0 0  Down, Depressed, Hopeless 3 3 0 - 0  PHQ - 2 Score 5 6 0 0 0  Altered sleeping 3 3 - - -  Tired, decreased energy 3 3 - - -  Change in  appetite 3 3 - - -  Feeling bad or failure about yourself  3 1 - - -  Trouble concentrating 3 3 - - -  Moving slowly or fidgety/restless 2 1 - - -  Suicidal thoughts 1 0 - - -  PHQ-9 Score 23 20 - - -  Some recent data might be hidden   .Marland KitchenMarland KitchenNo flowsheet data found.     .. Lab Results  Component Value Date   HGBA1C 6.2 10/20/2017   A!C looks great.  LDL 76.  BP controlled on 2nd recheck.  ARB. Continue same treatment plan.  Follow up in 3 months.   Added wellbutrin to depression treatment plan.

## 2017-10-23 ENCOUNTER — Encounter: Payer: Self-pay | Admitting: Physician Assistant

## 2017-10-25 ENCOUNTER — Telehealth: Payer: Self-pay | Admitting: *Deleted

## 2017-10-25 NOTE — Telephone Encounter (Signed)
Pre Authorization sent to cover my meds.[id number]

## 2017-10-26 ENCOUNTER — Encounter: Payer: Self-pay | Admitting: Family Medicine

## 2017-10-26 ENCOUNTER — Ambulatory Visit (INDEPENDENT_AMBULATORY_CARE_PROVIDER_SITE_OTHER): Payer: Medicare HMO | Admitting: Family Medicine

## 2017-10-26 VITALS — BP 137/82 | HR 75

## 2017-10-26 DIAGNOSIS — M9271 Juvenile osteochondrosis of metatarsus, right foot: Secondary | ICD-10-CM

## 2017-10-26 DIAGNOSIS — S93324A Dislocation of tarsometatarsal joint of right foot, initial encounter: Secondary | ICD-10-CM | POA: Diagnosis not present

## 2017-10-26 NOTE — Telephone Encounter (Signed)
lantus solostar approved form 10/15/17-10/16/2018.pharmacy notified

## 2017-10-27 NOTE — Progress Notes (Signed)
[name] Rice is a 62 y.o. female who presents to [facility] today for right foot Lisfranc injury.  [name] Rice has been seen multiple times for Lisfranc injury to her left foot.  As she is a's poor surgical candidate we have been trying serial casting.  She has been tolerating this pretty well but has had some issues with a pressure overlying her fifth toe.    Past Medical History:  Diagnosis Date  . Anxiety   . Arthritis   . Asthma    no PFTs  . Cervical dystonia   . COPD (chronic obstructive pulmonary disease) (HCC)    no PFTs  . Depression   . Diabetes mellitus   . Diarrhea   . Diverticulitis   . Diverticulosis   . Dizziness   . Gastroparesis   . GERD (gastroesophageal reflux disease)   . History of colonic polyps    hyperplastic  . History of kidney stones   . History of seborrheic dermatitis    face,ears  . HLD (hyperlipidemia)   . Hypertension   . Intussusception ([place])   . Lisfranc's sprain, left, initial encounter 10/06/2017  . Lumbar pain 2011   per x-ray - Multilevel spondylosis  . Morbid obesity ([place])   . Nephrolithiasis   . OSA on CPAP 2008  . Pneumonia   . Recurrent boils    "SLUSTER BOILS" ON ABD AND LABIA  . Sleep apnea    USES C-PAP  . Torn rotator cuff RT SHOULDER  . Tremor CCENTRAL NERVOUS SYSTEM   Past Surgical History:  Procedure Laterality Date  . CARPAL TUNNEL RELEASE Left 2006  . ELBOW SURGERY Left   . HAND SURGERY Right RIGHT  . JOINT REPLACEMENT Right    shoulder  . KNEE ARTHROSCOPY Left   . LUMBAR LAMINECTOMY    . MASS EXCISION  06/28/2012   Procedure: EXCISION MASS;  Surgeon: Harl Bowie, MD;  Location: [facility];  Service: General;  Laterality: Left;  excision left upper quadrant abdominal wall mass, Benign Lipoma  . NASAL SINUS SURGERY    . PILONIDAL CYST EXCISION  [id number]  . TUBAL LIGATION     Social History   Tobacco Use  . Smoking status: Former Smoker    Types: Cigarettes   Last attempt to quit: 10/17/1996    Years since quitting: 21.0  . Smokeless tobacco: Never Used  Substance Use Topics  . Alcohol use: No     ROS:  As above   Medications: Current Outpatient Medications  Medication Sig Dispense Refill  . albuterol (PROAIR HFA) 108 (90 Base) MCG/ACT inhaler inhale 2 puffs by mouth every 6 hours if needed FOR WHEEZING OR SHORTNESS OF BREATH 8.5 g 5  . AMBULATORY NON FORMULARY MEDICATION Diabetic shoes  Dx: Diabetes, uncontrolled 250.60 with neuropathy. 1 Device 0  . AMBULATORY NON FORMULARY MEDICATION Test strips and needles for one touch Verio IQ glucometer.  Use to test blood sugar up to four times a day. Diagnosis Type 2 Diabetes 100 each 3  . AMBULATORY NON FORMULARY MEDICATION 4 prong cane DX R06.89 1 each 0  . azelastine (ASTELIN) 0.1 % nasal spray Place 2 sprays into both nostrils 2 (two) times daily. Use in each nostril as directed 30 mL 6  . buPROPion (WELLBUTRIN XL) 150 MG 24 hr tablet Take 1 tablet (150 mg total) by mouth daily. 30 tablet 2  . DULoxetine (CYMBALTA) 60 MG capsule Take 2 capsules (120 mg total) by mouth  daily. 60 capsule 5  . Fluticasone-Salmeterol (ADVAIR DISKUS) 250-50 MCG/DOSE AEPB inhale 1 puff by mouth twice a day 60 each 11  . furosemide (LASIX) 20 MG tablet Take 1 tablet (20 mg total) by mouth daily. 30 tablet 5  . gabapentin (NEURONTIN) 800 MG tablet Take 1 tablet (800 mg total) by mouth 3 (three) times daily. 90 tablet 5  . glucose blood (ONETOUCH VERIO) test strip Check blood sugar 3 times daily. Diagnosis: Type II diabetes mellitus with neurological manifestations (E11.49) 100 each 3  . HYDROcodone-acetaminophen (NORCO/VICODIN) 5-325 MG tablet TAKE 1 TO 2 TABLETS BY MOUTH three times DAILY as needed 60 tablet 0  . Insulin Glargine (LANTUS SOLOSTAR) 100 UNIT/ML Solostar Pen Inject 40 Units into the skin at bedtime. Please include QS 3 months. 4 pen 2  . Insulin Pen Needle (NOVOFINE) 32G X 6 MM MISC USE 3 TIMES A DAY WITH  NOVOLOG 100 each 2  . Insulin Syringe-Needle U-100 (B-D INS SYR ULTRAFINE 1CC/30G) 30G X 1/2" 1 ML MISC use as directed three times a day 100 each 5  . levocetirizine (XYZAL) 5 MG tablet Take 1 tablet (5 mg total) by mouth every evening. 30 tablet 5  . liraglutide (VICTOZA) 18 MG/3ML SOPN inject 1.8 milligram subcutaneously daily 6 mL 2  . LORazepam (ATIVAN) 0.5 MG tablet Take 1 tablet (0.5 mg total) by mouth every 8 (eight) hours as needed for anxiety. 20 tablet 0  . meclizine (ANTIVERT) 25 MG tablet Take 1 tablet (25 mg total) by mouth 3 (three) times daily as needed for dizziness. 30 tablet 0  . meloxicam (MOBIC) 15 MG tablet Take 1 tablet (15 mg total) by mouth daily. 30 tablet 1  . metFORMIN (GLUCOPHAGE) 1000 MG tablet Take 1 tablet (1,000 mg total) by mouth 2 (two) times daily with a meal. 60 tablet 5  . nystatin ointment (MYCOSTATIN) apply to affected area twice a day 60 g 0  . NYSTATIN powder APPLY EXTERNALLY TO THE AFFECTED AREA THREE TIMES DAILY AS DIRECTED 60 g prn  . Olopatadine HCl (PATADAY) 0.2 % SOLN instill 1 drop into both eyes twice a day if needed (Patient taking differently: Place 1 drop into both eyes daily at 6 (six) AM. instill 1 drop into both eyes twice a day if needed) 2.5 mL 3  . pioglitazone (ACTOS) 30 MG tablet TAKE 1 TABLET BY MOUTH ONCE DAILY 30 tablet 0  . pioglitazone (ACTOS) 30 MG tablet Take 1 tablet (30 mg total) by mouth daily. 30 tablet 2  . propranolol (INDERAL) 20 MG tablet Take 1 tablet (20 mg total) by mouth 2 (two) times daily. 60 tablet 5   No current facility-administered medications for this visit.    Allergies  Allergen Reactions  . Ace Inhibitors Other (See Comments)    Causes blood pressure to drop rapidly  . Cefaclor Hives and Itching  . Shrimp [Shellfish Allergy] Nausea And Vomiting  . Ceclor [Cefaclor]   . Glyxambi [Empagliflozin-Linagliptin]     Itching/rash  . Sulfa Antibiotics   . Sulfonamide Derivatives Rash     Exam:  BP 137/82    Pulse 75   LMP 10/18/1995  General: Well Developed, well nourished, and in no acute distress.  Neuro/Psych: Alert and oriented x3, extra-ocular muscles intact, able to move all 4 extremities, sensation grossly intact. Skin: Warm and dry, no rashes noted.  Respiratory: Not using accessory muscles, speaking in full sentences, trachea midline.  Cardiovascular: Pulses palpable, no extremity edema. Abdomen: Does not  appear distended. MSK:  Left foot pretty normal-appearing with no significant effusion. Mildly tender to palpation overlying Lisfranc joint Slight erythema at the fifth toe  A new short leg cast was applied.  Special attention was made with a foam pad to provide pressure relief overlying the 5th toe    Assessment and Plan: 62 y.o. female with Lisfranc injury to the left foot. Plan to replace cast in 1-2 weeks.  At that time will probably get x-rays.  Recheck in the near future if needed.    No orders of the defined types were placed in this encounter.  No orders of the defined types were placed in this encounter.   Discussed warning signs or symptoms. Please see discharge instructions. Patient expresses understanding.

## 2017-11-02 DIAGNOSIS — R69 Illness, unspecified: Secondary | ICD-10-CM | POA: Diagnosis not present

## 2017-11-02 DIAGNOSIS — G4733 Obstructive sleep apnea (adult) (pediatric): Secondary | ICD-10-CM | POA: Diagnosis not present

## 2017-11-07 ENCOUNTER — Encounter: Payer: Self-pay | Admitting: Family Medicine

## 2017-11-07 ENCOUNTER — Ambulatory Visit (INDEPENDENT_AMBULATORY_CARE_PROVIDER_SITE_OTHER): Payer: Medicare HMO | Admitting: Family Medicine

## 2017-11-07 VITALS — BP 148/84 | HR 71 | Wt 279.0 lb

## 2017-11-07 DIAGNOSIS — M9271 Juvenile osteochondrosis of metatarsus, right foot: Secondary | ICD-10-CM

## 2017-11-07 DIAGNOSIS — S93324A Dislocation of tarsometatarsal joint of right foot, initial encounter: Secondary | ICD-10-CM

## 2017-11-07 NOTE — Patient Instructions (Signed)
Thank you for coming in today. Recheck in 2 weeks.  You should hear from podiatry.  Let me know if you do not hear anything.

## 2017-11-07 NOTE — Progress Notes (Signed)
[name] Rice is a 62 y.o. female who presents to [facility] today for follow up lisfranc injury to the right foot.   [name] Rice has been seen multiple times for Lisfranc injury to her left foot.  As she is a's poor surgical candidate we have been trying serial casting.  She has been tolerating this pretty well but has had some issues with a pressure overlying her fifth toe.  Her last visit was 10/26/17. She feels about the same which is improved from where was was prior to casting.    Past Medical History:  Diagnosis Date  . Anxiety   . Arthritis   . Asthma    no PFTs  . Cervical dystonia   . COPD (chronic obstructive pulmonary disease) (HCC)    no PFTs  . Depression   . Diabetes mellitus   . Diarrhea   . Diverticulitis   . Diverticulosis   . Dizziness   . Gastroparesis   . GERD (gastroesophageal reflux disease)   . History of colonic polyps    hyperplastic  . History of kidney stones   . History of seborrheic dermatitis    face,ears  . HLD (hyperlipidemia)   . Hypertension   . Intussusception ([place])   . Lisfranc's sprain, left, initial encounter 10/06/2017  . Lumbar pain 2011   per x-ray - Multilevel spondylosis  . Morbid obesity ([place])   . Nephrolithiasis   . OSA on CPAP 2008  . Pneumonia   . Recurrent boils    "SLUSTER BOILS" ON ABD AND LABIA  . Sleep apnea    USES C-PAP  . Torn rotator cuff RT SHOULDER  . Tremor CCENTRAL NERVOUS SYSTEM   Past Surgical History:  Procedure Laterality Date  . CARPAL TUNNEL RELEASE Left 2006  . ELBOW SURGERY Left   . HAND SURGERY Right RIGHT  . JOINT REPLACEMENT Right    shoulder  . KNEE ARTHROSCOPY Left   . LUMBAR LAMINECTOMY    . MASS EXCISION  06/28/2012   Procedure: EXCISION MASS;  Surgeon: Harl Bowie, MD;  Location: [facility];  Service: General;  Laterality: Left;  excision left upper quadrant abdominal wall mass, Benign Lipoma  . NASAL SINUS SURGERY    . PILONIDAL CYST  EXCISION  [id number]  . TUBAL LIGATION     Social History   Tobacco Use  . Smoking status: Former Smoker    Types: Cigarettes    Last attempt to quit: 10/17/1996    Years since quitting: 21.0  . Smokeless tobacco: Never Used  Substance Use Topics  . Alcohol use: No     ROS:  As above   Medications: Current Outpatient Medications  Medication Sig Dispense Refill  . albuterol (PROAIR HFA) 108 (90 Base) MCG/ACT inhaler inhale 2 puffs by mouth every 6 hours if needed FOR WHEEZING OR SHORTNESS OF BREATH 8.5 g 5  . AMBULATORY NON FORMULARY MEDICATION Diabetic shoes  Dx: Diabetes, uncontrolled 250.60 with neuropathy. 1 Device 0  . AMBULATORY NON FORMULARY MEDICATION Test strips and needles for one touch Verio IQ glucometer.  Use to test blood sugar up to four times a day. Diagnosis Type 2 Diabetes 100 each 3  . AMBULATORY NON FORMULARY MEDICATION 4 prong cane DX R06.89 1 each 0  . azelastine (ASTELIN) 0.1 % nasal spray Place 2 sprays into both nostrils 2 (two) times daily. Use in each nostril as directed 30 mL 6  . buPROPion (WELLBUTRIN XL) 150 MG  24 hr tablet Take 1 tablet (150 mg total) by mouth daily. 30 tablet 2  . DULoxetine (CYMBALTA) 60 MG capsule Take 2 capsules (120 mg total) by mouth daily. 60 capsule 5  . Fluticasone-Salmeterol (ADVAIR DISKUS) 250-50 MCG/DOSE AEPB inhale 1 puff by mouth twice a day 60 each 11  . furosemide (LASIX) 20 MG tablet Take 1 tablet (20 mg total) by mouth daily. 30 tablet 5  . gabapentin (NEURONTIN) 800 MG tablet Take 1 tablet (800 mg total) by mouth 3 (three) times daily. 90 tablet 5  . glucose blood (ONETOUCH VERIO) test strip Check blood sugar 3 times daily. Diagnosis: Type II diabetes mellitus with neurological manifestations (E11.49) 100 each 3  . HYDROcodone-acetaminophen (NORCO/VICODIN) 5-325 MG tablet TAKE 1 TO 2 TABLETS BY MOUTH three times DAILY as needed 60 tablet 0  . Insulin Glargine (LANTUS SOLOSTAR) 100 UNIT/ML Solostar Pen Inject 40 Units  into the skin at bedtime. Please include QS 3 months. 4 pen 2  . Insulin Pen Needle (NOVOFINE) 32G X 6 MM MISC USE 3 TIMES A DAY WITH NOVOLOG 100 each 2  . Insulin Syringe-Needle U-100 (B-D INS SYR ULTRAFINE 1CC/30G) 30G X 1/2" 1 ML MISC use as directed three times a day 100 each 5  . levocetirizine (XYZAL) 5 MG tablet Take 1 tablet (5 mg total) by mouth every evening. 30 tablet 5  . liraglutide (VICTOZA) 18 MG/3ML SOPN inject 1.8 milligram subcutaneously daily 6 mL 2  . LORazepam (ATIVAN) 0.5 MG tablet Take 1 tablet (0.5 mg total) by mouth every 8 (eight) hours as needed for anxiety. 20 tablet 0  . meclizine (ANTIVERT) 25 MG tablet Take 1 tablet (25 mg total) by mouth 3 (three) times daily as needed for dizziness. 30 tablet 0  . meloxicam (MOBIC) 15 MG tablet Take 1 tablet (15 mg total) by mouth daily. 30 tablet 1  . metFORMIN (GLUCOPHAGE) 1000 MG tablet Take 1 tablet (1,000 mg total) by mouth 2 (two) times daily with a meal. 60 tablet 5  . nystatin ointment (MYCOSTATIN) apply to affected area twice a day 60 g 0  . NYSTATIN powder APPLY EXTERNALLY TO THE AFFECTED AREA THREE TIMES DAILY AS DIRECTED 60 g prn  . Olopatadine HCl (PATADAY) 0.2 % SOLN instill 1 drop into both eyes twice a day if needed (Patient taking differently: Place 1 drop into both eyes daily at 6 (six) AM. instill 1 drop into both eyes twice a day if needed) 2.5 mL 3  . pioglitazone (ACTOS) 30 MG tablet TAKE 1 TABLET BY MOUTH ONCE DAILY 30 tablet 0  . pioglitazone (ACTOS) 30 MG tablet Take 1 tablet (30 mg total) by mouth daily. 30 tablet 2  . propranolol (INDERAL) 20 MG tablet Take 1 tablet (20 mg total) by mouth 2 (two) times daily. 60 tablet 5   No current facility-administered medications for this visit.    Allergies  Allergen Reactions  . Ace Inhibitors Other (See Comments)    Causes blood pressure to drop rapidly  . Cefaclor Hives and Itching  . Shrimp [Shellfish Allergy] Nausea And Vomiting  . Ceclor [Cefaclor]   .  Glyxambi [Empagliflozin-Linagliptin]     Itching/rash  . Sulfa Antibiotics   . Sulfonamide Derivatives Rash     Exam:  BP (!) 148/84   Pulse 71   Wt 279 lb (126.6 kg)   LMP 10/18/1995   BMI 49.42 kg/m  General: Well Developed, well nourished, and in no acute distress.  Neuro/Psych: Alert and  oriented x3, extra-ocular muscles intact, able to move all 4 extremities, sensation grossly intact. Skin: Warm and dry, no rashes noted.  Respiratory: Not using accessory muscles, speaking in full sentences, trachea midline.  Cardiovascular: Pulses palpable, no extremity edema. Abdomen: Does not appear distended. MSK: Left foot normal-appearing with no significant effusion. Mildly tender to palpation overlying Lisfranc joint Slight erythema at the fifth toe  A new short leg cast was applied.  Special attention was made with a foam pad to provide pressure relief overlying the 5th toe   No results found for this or any previous visit (from the past 48 hour(s)). No results found.    Assessment and Plan: 62 y.o. female with lisfrank joint injury. Plan for repeat casting with limited weightbearing.  Repeat referral to podiatry for second opinion. (Her phone was not working for the 1st referral).  Recheck in 2 weeks for cast change.     No orders of the defined types were placed in this encounter.  No orders of the defined types were placed in this encounter.   Discussed warning signs or symptoms. Please see discharge instructions. Patient expresses understanding.

## 2017-11-20 ENCOUNTER — Ambulatory Visit: Payer: Medicare HMO | Admitting: Podiatry

## 2017-11-20 ENCOUNTER — Other Ambulatory Visit: Payer: Self-pay

## 2017-11-20 VITALS — BP 144/64 | HR 87 | Wt 275.0 lb

## 2017-11-20 DIAGNOSIS — S99921A Unspecified injury of right foot, initial encounter: Secondary | ICD-10-CM | POA: Diagnosis not present

## 2017-11-20 DIAGNOSIS — M542 Cervicalgia: Secondary | ICD-10-CM

## 2017-11-20 DIAGNOSIS — S93324A Dislocation of tarsometatarsal joint of right foot, initial encounter: Secondary | ICD-10-CM

## 2017-11-20 DIAGNOSIS — M9271 Juvenile osteochondrosis of metatarsus, right foot: Secondary | ICD-10-CM

## 2017-11-20 DIAGNOSIS — M545 Low back pain: Secondary | ICD-10-CM

## 2017-11-20 DIAGNOSIS — G894 Chronic pain syndrome: Secondary | ICD-10-CM

## 2017-11-20 MED ORDER — HYDROCODONE-ACETAMINOPHEN 5-325 MG PO TABS: ORAL_TABLET | ORAL | 0 refills | Status: DC

## 2017-11-20 NOTE — Progress Notes (Signed)
SUBJECTIVE: 62 y.o. year old female presents complaining of right foot pain.  Most of her pain is at the posterior heel, lateral ankle and 2nd toe area. The affected lower limb is under fiberglass cast and ambulated aided by a walker. She was accompanied by her daughter.  Patient stated that she is doing better for the last 2 weeks.  Her last A1c was 6.2 last month.   HPI: Patient relates a history of injury to the affected foot in September 2018.  The initial x-ray on 07/11/17 findings were not definitive for acute osseous or ligamentous injury.  MRI on 08/13/17 was a suspicious injury to the Lisfranc joint involving 2nd Met base, medial and middle Cuneiform ones. Stated that her injury was initially treated with immobilization utilizing CAM walking boot till the subsequent radiograph revealed definitive findings of Lisfranc joint separation along with Freiberg's infarction at 2nd metatarsal head on 10/04/17 x-ray. Her right lower limb was placed in a fiberglass cast since 10/05/17. Patient is referred by Dr. Georgina Snell.   Medical history is significant for COPD, Diverticulitis, Controlled HTN, controlled IDDM, and weighs 267 lbs.  Review of Systems  Constitutional: Negative.   HENT: Negative.   Eyes: Negative.   Respiratory: Negative for cough.        COPD.  Cardiovascular: Negative.   Gastrointestinal: Negative.   Genitourinary: Negative.   Musculoskeletal: Negative.   Skin: Negative.   Endo/Heme/Allergies:       IDDM controlled.    OBJECTIVE: Right lower limb cast is clean and good shape. 2nd and 3rd toe has buddy splint. Mild digital edema noted. Neurovascular findings deferred since she will have her cast removed at Dr. Derrick Ravel office tomorrow.  ASSESSMENT: S/P Right foot injury 07/11/17. Injury toe Lisfranc joint, 2nd metatarsal base and 2nd Cuneiform has shifted laterally from the first metatarsal and medial cuneiform. Freiberg's infraction 2nd metatarsal head, possible due to  vascular embarrassment secondary to increased gapping of the first intermetatarsal space with laterally migrating 2nd through 5th metatarsal bones. Controlled IDDM. Diabetic neuropathy.  PLAN: Reviewed findings and available treatment options, conservative and surgical. Reviewed surgical option would include reducing or fusing the injured Lisfranc joint of right foot involving fist, 2nd metatarsal, first and 2nd cuneiform complex. Reduction or fusion may also restore blood flow to the 2nd metatarsal head that is developing possible avascular necrosis since widening of the first intermetatarsal space.  Patient would like to wait a few weeks to see how it progress. If pain continues or gets worse, she will look into surgical options. Patient has an appointment with Dr. Georgina Snell tomorrow.

## 2017-11-20 NOTE — Patient Instructions (Signed)
Seen for injured right foot that is in cast. Past x-ray findings and MRI reports reviewed with patient. Available treatment options including surgery reviewed. Patient will wait a couple of month to see her progress.  If condition worsens will visit surgical option. Report will be sent to Dr. Tommi Rumps before tomorrow's appointment. Return as needed.

## 2017-11-20 NOTE — Telephone Encounter (Signed)
[name] Rice needs a refill on hydrocodone.

## 2017-11-21 ENCOUNTER — Ambulatory Visit (INDEPENDENT_AMBULATORY_CARE_PROVIDER_SITE_OTHER): Payer: Medicare HMO | Admitting: Family Medicine

## 2017-11-21 ENCOUNTER — Encounter: Payer: Self-pay | Admitting: Family Medicine

## 2017-11-21 ENCOUNTER — Encounter: Payer: Self-pay | Admitting: Podiatry

## 2017-11-21 VITALS — BP 137/64 | HR 77 | Wt 281.0 lb

## 2017-11-21 DIAGNOSIS — M9271 Juvenile osteochondrosis of metatarsus, right foot: Secondary | ICD-10-CM | POA: Diagnosis not present

## 2017-11-21 DIAGNOSIS — S93324A Dislocation of tarsometatarsal joint of right foot, initial encounter: Secondary | ICD-10-CM | POA: Diagnosis not present

## 2017-11-21 MED ORDER — CAPSAICIN 0.033 % EX CREA: 1.0000 [in_us] | TOPICAL_CREAM | Freq: Four times a day (QID) | CUTANEOUS | 12 refills | Status: DC

## 2017-11-21 NOTE — Patient Instructions (Signed)
Thank you for coming in today. Recheck in 1 month.  Return sooner if needed.    

## 2017-11-22 NOTE — Progress Notes (Signed)
[name] Rice is a 62 y.o. female who presents to [facility] today for right Friedberg's disease and right Lisfranc dislocation.  [name] Rice has been seen multiple times for foot pain subsequently discovered to be a Lisfranc injury and Freiberg's disease.  She failed trial of Cam Walker boot and over the last 6 weeks with has been doing serial casting.  She had a second opinion with podiatry who agrees that she is not a good surgical candidate.  She notes the pain has been a lot better over the last month or so.   Past Medical History:  Diagnosis Date  . Anxiety   . Arthritis   . Asthma    no PFTs  . Cervical dystonia   . COPD (chronic obstructive pulmonary disease) (HCC)    no PFTs  . Depression   . Diabetes mellitus   . Diarrhea   . Diverticulitis   . Diverticulosis   . Dizziness   . Gastroparesis   . GERD (gastroesophageal reflux disease)   . History of colonic polyps    hyperplastic  . History of kidney stones   . History of seborrheic dermatitis    face,ears  . HLD (hyperlipidemia)   . Hypertension   . Intussusception ([place])   . Lisfranc's sprain, left, initial encounter 10/06/2017  . Lumbar pain 2011   per x-ray - Multilevel spondylosis  . Morbid obesity ([place])   . Nephrolithiasis   . OSA on CPAP 2008  . Pneumonia   . Recurrent boils    "SLUSTER BOILS" ON ABD AND LABIA  . Sleep apnea    USES C-PAP  . Torn rotator cuff RT SHOULDER  . Tremor CCENTRAL NERVOUS SYSTEM   Past Surgical History:  Procedure Laterality Date  . CARPAL TUNNEL RELEASE Left 2006  . ELBOW SURGERY Left   . HAND SURGERY Right RIGHT  . JOINT REPLACEMENT Right    shoulder  . KNEE ARTHROSCOPY Left   . LUMBAR LAMINECTOMY    . MASS EXCISION  06/28/2012   Procedure: EXCISION MASS;  Surgeon: Harl Bowie, MD;  Location: [facility];  Service: General;  Laterality: Left;  excision left upper quadrant abdominal wall mass, Benign Lipoma  . NASAL SINUS SURGERY     . PILONIDAL CYST EXCISION  [id number]  . TUBAL LIGATION     Social History   Tobacco Use  . Smoking status: Former Smoker    Types: Cigarettes    Last attempt to quit: 10/17/1996    Years since quitting: 21.1  . Smokeless tobacco: Never Used  Substance Use Topics  . Alcohol use: No     ROS:  As above   Medications: Current Outpatient Medications  Medication Sig Dispense Refill  . albuterol (PROAIR HFA) 108 (90 Base) MCG/ACT inhaler inhale 2 puffs by mouth every 6 hours if needed FOR WHEEZING OR SHORTNESS OF BREATH 8.5 g 5  . AMBULATORY NON FORMULARY MEDICATION Diabetic shoes  Dx: Diabetes, uncontrolled 250.60 with neuropathy. 1 Device 0  . AMBULATORY NON FORMULARY MEDICATION Test strips and needles for one touch Verio IQ glucometer.  Use to test blood sugar up to four times a day. Diagnosis Type 2 Diabetes 100 each 3  . AMBULATORY NON FORMULARY MEDICATION 4 prong cane DX R06.89 1 each 0  . azelastine (ASTELIN) 0.1 % nasal spray Place 2 sprays into both nostrils 2 (two) times daily. Use in each nostril as directed 30 mL 6  . buPROPion (WELLBUTRIN XL) 150 MG  24 hr tablet Take 1 tablet (150 mg total) by mouth daily. 30 tablet 2  . DULoxetine (CYMBALTA) 60 MG capsule Take 2 capsules (120 mg total) by mouth daily. 60 capsule 5  . Fluticasone-Salmeterol (ADVAIR DISKUS) 250-50 MCG/DOSE AEPB inhale 1 puff by mouth twice a day 60 each 11  . furosemide (LASIX) 20 MG tablet Take 1 tablet (20 mg total) by mouth daily. 30 tablet 5  . gabapentin (NEURONTIN) 800 MG tablet Take 1 tablet (800 mg total) by mouth 3 (three) times daily. 90 tablet 5  . glucose blood (ONETOUCH VERIO) test strip Check blood sugar 3 times daily. Diagnosis: Type II diabetes mellitus with neurological manifestations (E11.49) 100 each 3  . HYDROcodone-acetaminophen (NORCO/VICODIN) 5-325 MG tablet TAKE 1 TO 2 TABLETS BY MOUTH three times DAILY as needed 60 tablet 0  . Insulin Glargine (LANTUS SOLOSTAR) 100 UNIT/ML Solostar  Pen Inject 40 Units into the skin at bedtime. Please include QS 3 months. 4 pen 2  . Insulin Pen Needle (NOVOFINE) 32G X 6 MM MISC USE 3 TIMES A DAY WITH NOVOLOG 100 each 2  . Insulin Syringe-Needle U-100 (B-D INS SYR ULTRAFINE 1CC/30G) 30G X 1/2" 1 ML MISC use as directed three times a day 100 each 5  . levocetirizine (XYZAL) 5 MG tablet Take 1 tablet (5 mg total) by mouth every evening. 30 tablet 5  . liraglutide (VICTOZA) 18 MG/3ML SOPN inject 1.8 milligram subcutaneously daily 6 mL 2  . LORazepam (ATIVAN) 0.5 MG tablet Take 1 tablet (0.5 mg total) by mouth every 8 (eight) hours as needed for anxiety. 20 tablet 0  . meclizine (ANTIVERT) 25 MG tablet Take 1 tablet (25 mg total) by mouth 3 (three) times daily as needed for dizziness. 30 tablet 0  . meloxicam (MOBIC) 15 MG tablet Take 1 tablet (15 mg total) by mouth daily. 30 tablet 1  . metFORMIN (GLUCOPHAGE) 1000 MG tablet Take 1 tablet (1,000 mg total) by mouth 2 (two) times daily with a meal. 60 tablet 5  . nystatin ointment (MYCOSTATIN) apply to affected area twice a day 60 g 0  . NYSTATIN powder APPLY EXTERNALLY TO THE AFFECTED AREA THREE TIMES DAILY AS DIRECTED 60 g prn  . Olopatadine HCl (PATADAY) 0.2 % SOLN instill 1 drop into both eyes twice a day if needed (Patient taking differently: Place 1 drop into both eyes daily at 6 (six) AM. instill 1 drop into both eyes twice a day if needed) 2.5 mL 3  . pioglitazone (ACTOS) 30 MG tablet TAKE 1 TABLET BY MOUTH ONCE DAILY 30 tablet 0  . pioglitazone (ACTOS) 30 MG tablet Take 1 tablet (30 mg total) by mouth daily. 30 tablet 2  . propranolol (INDERAL) 20 MG tablet Take 1 tablet (20 mg total) by mouth 2 (two) times daily. 60 tablet 5  . Capsaicin 0.033 % CREA Apply 1 inch topically 4 (four) times daily. 56.6 g 12   No current facility-administered medications for this visit.    Allergies  Allergen Reactions  . Ace Inhibitors Other (See Comments)    Causes blood pressure to drop rapidly  .  Cefaclor Hives and Itching  . Shrimp [Shellfish Allergy] Nausea And Vomiting  . Ceclor [Cefaclor]   . Glyxambi [Empagliflozin-Linagliptin]     Itching/rash  . Sulfa Antibiotics   . Sulfonamide Derivatives Rash     Exam:  BP 137/64   Pulse 77   Wt 281 lb (127.5 kg)   LMP 10/18/1995   BMI 49.78  kg/m  General: Well Developed, well nourished, and in no acute distress.  Neuro/Psych: Alert and oriented x3, extra-ocular muscles intact, able to move all 4 extremities, sensation grossly intact. Skin: Warm and dry, no rashes noted.  Respiratory: Not using accessory muscles, speaking in full sentences, trachea midline.  Cardiovascular: Pulses palpable, no extremity edema. Abdomen: Does not appear distended. MSK: Right foot not particularly swollen or tender.  Pulses capillary refill and sensation are intact.    No results found for this or any previous visit (from the past 48 hour(s)). No results found.    Assessment and Plan: 62 y.o. female with  Cast removed transition back to [employer] boot as tolerated.  Recheck in 1 month.  If pain is totally uncontrolled out of cast then she may be a better surgical candidate.  Otherwise we will proceed with watchful waiting.    Discussed warning signs or symptoms. Please see discharge instructions. Patient expresses understanding.  I spent 15 minutes with this patient, greater than 50% was face-to-face time counseling regarding ddx and treatment plan.

## 2017-11-24 ENCOUNTER — Telehealth: Payer: Self-pay

## 2017-11-24 NOTE — Telephone Encounter (Signed)
Dr. Georgina Snell, Patient called stated that since she had her cast removed and been back in the boot it is difficult for her to walk. She stated that she is still having pain in her foot and she is going to have to go with the surgery, she is requesting that you get with Dr. Oswald Hillock and schedule her surgery. Please advise. [name] Cunningham,CMA

## 2017-11-24 NOTE — Telephone Encounter (Signed)
Please call Dr Milagros Reap office to schedule surgery. She can return to clinic for repeat casting if desired.

## 2017-11-24 NOTE — Telephone Encounter (Signed)
Patient has been advised she has been transfered to scheduling to schedule appointment for a cast on Monday. [name] Cunningham,CMA

## 2017-11-27 ENCOUNTER — Ambulatory Visit (INDEPENDENT_AMBULATORY_CARE_PROVIDER_SITE_OTHER): Payer: Medicare HMO | Admitting: Family Medicine

## 2017-11-27 DIAGNOSIS — M9271 Juvenile osteochondrosis of metatarsus, right foot: Secondary | ICD-10-CM

## 2017-11-27 DIAGNOSIS — S93324A Dislocation of tarsometatarsal joint of right foot, initial encounter: Secondary | ICD-10-CM | POA: Diagnosis not present

## 2017-11-27 DIAGNOSIS — J300 Vasomotor rhinitis: Secondary | ICD-10-CM | POA: Diagnosis not present

## 2017-11-27 MED ORDER — IPRATROPIUM BROMIDE 0.06 % NA SOLN: 2.0000 | NASAL | 6 refills | Status: DC | PRN

## 2017-11-27 NOTE — Progress Notes (Signed)
[name] Rice is a 62 y.o. female who presents to [facility] today for follow-up leg pain.  [name] Rice was seen last week for foot pain.  She has a Lisfranc dislocation that we have been casting for 6 weeks.  She had followed up with podiatry on 2/4/66m who agreed with a trail of out of the cast. If [name] Rice were to fail the trial of transition to cam walker boot then she would proceed to surgery.   [name] notes that currently just about as soon as she got out of the cast she began having foot pain again.  She would like to proceed to surgery if possible.  In the meantime she is here for repeat casting as that seems to help her pain.  Additionally [name] Rice notes runny nose is bothersome especially with mealtimes.  She is to have a trial of Astelin nasal spray which she does not find to be very helpful. Past Medical History:  Diagnosis Date  . Anxiety   . Arthritis   . Asthma    no PFTs  . Cervical dystonia   . COPD (chronic obstructive pulmonary disease) (HCC)    no PFTs  . Depression   . Diabetes mellitus   . Diarrhea   . Diverticulitis   . Diverticulosis   . Dizziness   . Gastroparesis   . GERD (gastroesophageal reflux disease)   . History of colonic polyps    hyperplastic  . History of kidney stones   . History of seborrheic dermatitis    face,ears  . HLD (hyperlipidemia)   . Hypertension   . Intussusception ([place])   . Lisfranc's sprain, left, initial encounter 10/06/2017  . Lumbar pain 2011   per x-ray - Multilevel spondylosis  . Morbid obesity ([place])   . Nephrolithiasis   . OSA on CPAP 2008  . Pneumonia   . Recurrent boils    "SLUSTER BOILS" ON ABD AND LABIA  . Sleep apnea    USES C-PAP  . Torn rotator cuff RT SHOULDER  . Tremor CCENTRAL NERVOUS SYSTEM   Past Surgical History:  Procedure Laterality Date  . CARPAL TUNNEL RELEASE Left 2006  . ELBOW SURGERY Left   . HAND SURGERY Right RIGHT  . JOINT REPLACEMENT Right    shoulder   . KNEE ARTHROSCOPY Left   . LUMBAR LAMINECTOMY    . MASS EXCISION  06/28/2012   Procedure: EXCISION MASS;  Surgeon: Harl Bowie, MD;  Location: [facility];  Service: General;  Laterality: Left;  excision left upper quadrant abdominal wall mass, Benign Lipoma  . NASAL SINUS SURGERY    . PILONIDAL CYST EXCISION  [id number]  . TUBAL LIGATION     Social History   Tobacco Use  . Smoking status: Former Smoker    Types: Cigarettes    Last attempt to quit: 10/17/1996    Years since quitting: 21.1  . Smokeless tobacco: Never Used  Substance Use Topics  . Alcohol use: No     ROS:  As above   Medications: Current Outpatient Medications  Medication Sig Dispense Refill  . albuterol (PROAIR HFA) 108 (90 Base) MCG/ACT inhaler inhale 2 puffs by mouth every 6 hours if needed FOR WHEEZING OR SHORTNESS OF BREATH 8.5 g 5  . AMBULATORY NON FORMULARY MEDICATION Diabetic shoes  Dx: Diabetes, uncontrolled 250.60 with neuropathy. 1 Device 0  . AMBULATORY NON FORMULARY MEDICATION Test strips and needles for one touch Verio IQ glucometer.  Use to test blood  sugar up to four times a day. Diagnosis Type 2 Diabetes 100 each 3  . AMBULATORY NON FORMULARY MEDICATION 4 prong cane DX R06.89 1 each 0  . azelastine (ASTELIN) 0.1 % nasal spray Place 2 sprays into both nostrils 2 (two) times daily. Use in each nostril as directed 30 mL 6  . buPROPion (WELLBUTRIN XL) 150 MG 24 hr tablet Take 1 tablet (150 mg total) by mouth daily. 30 tablet 2  . Capsaicin 0.033 % CREA Apply 1 inch topically 4 (four) times daily. 56.6 g 12  . DULoxetine (CYMBALTA) 60 MG capsule Take 2 capsules (120 mg total) by mouth daily. 60 capsule 5  . Fluticasone-Salmeterol (ADVAIR DISKUS) 250-50 MCG/DOSE AEPB inhale 1 puff by mouth twice a day 60 each 11  . furosemide (LASIX) 20 MG tablet Take 1 tablet (20 mg total) by mouth daily. 30 tablet 5  . gabapentin (NEURONTIN) 800 MG tablet Take 1 tablet (800 mg total) by mouth 3 (three) times daily.  90 tablet 5  . glucose blood (ONETOUCH VERIO) test strip Check blood sugar 3 times daily. Diagnosis: Type II diabetes mellitus with neurological manifestations (E11.49) 100 each 3  . HYDROcodone-acetaminophen (NORCO/VICODIN) 5-325 MG tablet TAKE 1 TO 2 TABLETS BY MOUTH three times DAILY as needed 60 tablet 0  . Insulin Glargine (LANTUS SOLOSTAR) 100 UNIT/ML Solostar Pen Inject 40 Units into the skin at bedtime. Please include QS 3 months. 4 pen 2  . Insulin Pen Needle (NOVOFINE) 32G X 6 MM MISC USE 3 TIMES A DAY WITH NOVOLOG 100 each 2  . Insulin Syringe-Needle U-100 (B-D INS SYR ULTRAFINE 1CC/30G) 30G X 1/2" 1 ML MISC use as directed three times a day 100 each 5  . levocetirizine (XYZAL) 5 MG tablet Take 1 tablet (5 mg total) by mouth every evening. 30 tablet 5  . liraglutide (VICTOZA) 18 MG/3ML SOPN inject 1.8 milligram subcutaneously daily 6 mL 2  . LORazepam (ATIVAN) 0.5 MG tablet Take 1 tablet (0.5 mg total) by mouth every 8 (eight) hours as needed for anxiety. 20 tablet 0  . meclizine (ANTIVERT) 25 MG tablet Take 1 tablet (25 mg total) by mouth 3 (three) times daily as needed for dizziness. 30 tablet 0  . meloxicam (MOBIC) 15 MG tablet Take 1 tablet (15 mg total) by mouth daily. 30 tablet 1  . metFORMIN (GLUCOPHAGE) 1000 MG tablet Take 1 tablet (1,000 mg total) by mouth 2 (two) times daily with a meal. 60 tablet 5  . nystatin ointment (MYCOSTATIN) apply to affected area twice a day 60 g 0  . NYSTATIN powder APPLY EXTERNALLY TO THE AFFECTED AREA THREE TIMES DAILY AS DIRECTED 60 g prn  . Olopatadine HCl (PATADAY) 0.2 % SOLN instill 1 drop into both eyes twice a day if needed (Patient taking differently: Place 1 drop into both eyes daily at 6 (six) AM. instill 1 drop into both eyes twice a day if needed) 2.5 mL 3  . pioglitazone (ACTOS) 30 MG tablet TAKE 1 TABLET BY MOUTH ONCE DAILY 30 tablet 0  . pioglitazone (ACTOS) 30 MG tablet Take 1 tablet (30 mg total) by mouth daily. 30 tablet 2  .  propranolol (INDERAL) 20 MG tablet Take 1 tablet (20 mg total) by mouth 2 (two) times daily. 60 tablet 5  . ipratropium (ATROVENT) 0.06 % nasal spray Place 2 sprays into both nostrils every 4 (four) hours as needed for rhinitis. 10 mL 6   No current facility-administered medications for this visit.  Allergies  Allergen Reactions  . Ace Inhibitors Other (See Comments)    Causes blood pressure to drop rapidly  . Cefaclor Hives and Itching  . Shrimp [Shellfish Allergy] Nausea And Vomiting  . Ceclor [Cefaclor]   . Glyxambi [Empagliflozin-Linagliptin]     Itching/rash  . Sulfa Antibiotics   . Sulfonamide Derivatives Rash     Exam:  LMP 10/18/1995  General: Well Developed, well nourished, and in no acute distress.  Neuro/Psych: Alert and oriented x3, extra-ocular muscles intact, able to move all 4 extremities, sensation grossly intact. Skin: Warm and dry, no rashes noted.  Respiratory: Not using accessory muscles, speaking in full sentences, trachea midline.  Clear nasal discharge present. Cardiovascular: Pulses palpable, no extremity edema. Abdomen: Does not appear distended. MSK: Right foot normal-appearing tender to palpation Lisfranc area.  Pulses capillary refill and sensation intact.  Short leg walking cast was applied.    No results found for this or any previous visit (from the past 48 hour(s)). No results found.    Assessment and Plan: 62 y.o. female with Lisfranc disassociation and Freiberg's disease of the right foot.  Plan for follow back up with podiatry for surgical planning.  In the meantime casting for comfort.  If surgery is not scheduled within 2 weeks she will return for cast change in 2 weeks.  Runny nose is vasomotor rhinitis.  Trial of Atrovent nasal spray.  No orders of the defined types were placed in this encounter.  Meds ordered this encounter  Medications  . ipratropium (ATROVENT) 0.06 % nasal spray    Sig: Place 2 sprays into both nostrils every  4 (four) hours as needed for rhinitis.    Dispense:  10 mL    Refill:  6    Discussed warning signs or symptoms. Please see discharge instructions. Patient expresses understanding.  CC: Dr Caffie Pinto

## 2017-11-27 NOTE — Patient Instructions (Addendum)
Thank you for coming in today. Contact Dr Latanya Presser office.  I will also let her know.  If you dont have surgery now recheck cast change in 2 weeks.  I can remove the cast as needed.   Use atrovent nasal spray for Vasomotor rhinitis.

## 2017-11-28 DIAGNOSIS — J300 Vasomotor rhinitis: Secondary | ICD-10-CM | POA: Insufficient documentation

## 2017-12-06 ENCOUNTER — Telehealth: Payer: Self-pay

## 2017-12-06 ENCOUNTER — Other Ambulatory Visit: Payer: Self-pay

## 2017-12-06 ENCOUNTER — Telehealth: Payer: Self-pay | Admitting: *Deleted

## 2017-12-06 DIAGNOSIS — G894 Chronic pain syndrome: Secondary | ICD-10-CM

## 2017-12-06 DIAGNOSIS — M542 Cervicalgia: Secondary | ICD-10-CM

## 2017-12-06 DIAGNOSIS — M545 Low back pain: Secondary | ICD-10-CM

## 2017-12-06 MED ORDER — HYDROCODONE-ACETAMINOPHEN 5-325 MG PO TABS: ORAL_TABLET | ORAL | 0 refills | Status: DC

## 2017-12-06 NOTE — Telephone Encounter (Signed)
[name] Rice is needed an early refill on Hydrocodone. She is now, and has been, taking 3 tablet daily due to her foot injury. Can the prescription be increased to 3 daily?

## 2017-12-06 NOTE — Telephone Encounter (Signed)
Patient apparently called her insurance earlier and was given a specific price of what her up front cost would be per the patient . I explained to the patient that we would have to call and get the surgery benefits first and then after this call we would be able to let her know what her up front cost would be. I advised the patient since she has decided to proceed with surgery then the next step in the process is to have her come back in for appt to go over surgery infor and sign consent. After that visit we would have the necessary codes to submit to insurance to get surgery benefits. Appointment for patient has been scheduled.

## 2017-12-06 NOTE — Telephone Encounter (Signed)
Ok we have other options if needed

## 2017-12-06 NOTE — Telephone Encounter (Signed)
The patient called [facility] back and did explain that since Dr. Caffie Pinto does her surgery's at the surgical center she would have to pay for the surgery up front. I explained to her that we can call her insurance and look at her surgical benefits to see exactly what would be owed. The payment would be half of whatever her deductible is and this payment would be made to [facility]. The podiatrist in this area perform surgery at [facility]. The patient decided that she would like for me to call her insurance and then call her back once we have her benefits

## 2017-12-06 NOTE — Telephone Encounter (Signed)
Unable to afford to have surgery with Camelia Phenes, DPM. She states Dr Caffie Pinto only operates at the [facility] and they require $250 deposit. She would like to be referred to a different Podiatrist. Please advise.

## 2017-12-09 ENCOUNTER — Other Ambulatory Visit: Payer: Self-pay | Admitting: Physician Assistant

## 2017-12-09 DIAGNOSIS — E118 Type 2 diabetes mellitus with unspecified complications: Secondary | ICD-10-CM

## 2017-12-09 DIAGNOSIS — E1142 Type 2 diabetes mellitus with diabetic polyneuropathy: Secondary | ICD-10-CM

## 2017-12-09 DIAGNOSIS — Z794 Long term (current) use of insulin: Secondary | ICD-10-CM

## 2017-12-11 ENCOUNTER — Ambulatory Visit (INDEPENDENT_AMBULATORY_CARE_PROVIDER_SITE_OTHER): Payer: Medicare HMO | Admitting: Family Medicine

## 2017-12-11 ENCOUNTER — Ambulatory Visit (INDEPENDENT_AMBULATORY_CARE_PROVIDER_SITE_OTHER): Payer: Medicare HMO | Admitting: Podiatry

## 2017-12-11 ENCOUNTER — Encounter: Payer: Self-pay | Admitting: Family Medicine

## 2017-12-11 VITALS — BP 181/80 | HR 85 | Wt 278.0 lb

## 2017-12-11 DIAGNOSIS — M9271 Juvenile osteochondrosis of metatarsus, right foot: Secondary | ICD-10-CM | POA: Diagnosis not present

## 2017-12-11 DIAGNOSIS — S93324A Dislocation of tarsometatarsal joint of right foot, initial encounter: Secondary | ICD-10-CM | POA: Diagnosis not present

## 2017-12-11 DIAGNOSIS — J300 Vasomotor rhinitis: Secondary | ICD-10-CM

## 2017-12-11 DIAGNOSIS — S99921A Unspecified injury of right foot, initial encounter: Secondary | ICD-10-CM | POA: Diagnosis not present

## 2017-12-11 NOTE — Patient Instructions (Signed)
Thank you for coming in today. Recheck as needed.  I will follow surgery notes.

## 2017-12-11 NOTE — Telephone Encounter (Signed)
closed

## 2017-12-11 NOTE — Progress Notes (Signed)
Subjective: 5 month S/P right foot injury with subsequent Lisfranc joint separation. She was seen and evaluated here on 11/20/17. Patient decided to wait for a few more weeks to see how she feels about the foot. Walked in with BK cast on right lower limb. Stated that she is ready for surgery. Stated that being out of cast gives her much pain to walk.  HPI: Initial injury to the right foot in September 2018.  The initial x-ray on 07/11/17 findings were not definitive for acute osseous or ligamentous injury.  MRI on 08/13/17 was a suspicious injury to the Lisfranc joint involving 2nd Met base, medial and middle Cuneiform ones. Subsequent radiograph done on 10/04/17 revealed definitive change in Lisfranc joint separation along with Freiberg's infarction at 2nd metatarsal head  Her right lower limb was placed in a fiberglass cast since 10/05/17.   Past surgical history reveal: Poly nidal cyst in tail bone. Left knee surgery for torn cartilage, left elbow for tennis elbow, right shoulder bursitis, left trigger thumb, right hand surgery for infection in bone, Sinus surgery, back surgery. Having Allergic reaction and scratch throat from taking.  Positive medical history of COPD, Diverticulitis, HTN, IDDM under control.  Assessment: S/P right foot injury 07/11/17. Lisfranc joint separation and Freiberg's infraction 2nd metatarsal head. Controlled Diabetic. Diabetic neuropathy.  Plan: Patient has appointment this afternoon. She will return her PCP and have the cast removed to get some break before the surgery. Will schedule surgery this Friday for fusion of separated Lisfranc joint (CPT 28730)right foot. Surgery consent form reviewed for the above procedure. All questions answered.

## 2017-12-11 NOTE — Progress Notes (Signed)
[name] Rice is a 62 y.o. female who presents to [facility] today for pain.  I have seen Damiyah multiple times for Lisfranc dislocation and Freiberg's disease.  At the last visit we determined that she had failed conservative management and had surgery planned.  She followed up with her podiatrist today who requests me to remove the cast in preparation for surgery later this week.  Margo notes that her pain typically is controlled in the cast but bad out of it.   Additionally Vaughan Basta notes that Atrovent nasal spray I prescribed at the last visit for presumed vasomotor rhinitis were caused throat swelling.  She is concerned that she may have this medication.  Throat swelling occurred when she used it and did not occur when she did not use it she notes the Astelin nasal spray is only mildly effective for runny nose.   Past Medical History:  Diagnosis Date  . Anxiety   . Arthritis   . Asthma    no PFTs  . Cervical dystonia   . COPD (chronic obstructive pulmonary disease) (HCC)    no PFTs  . Depression   . Diabetes mellitus   . Diarrhea   . Diverticulitis   . Diverticulosis   . Dizziness   . Gastroparesis   . GERD (gastroesophageal reflux disease)   . History of colonic polyps    hyperplastic  . History of kidney stones   . History of seborrheic dermatitis    face,ears  . HLD (hyperlipidemia)   . Hypertension   . Intussusception ([place])   . Lisfranc's sprain, left, initial encounter 10/06/2017  . Lumbar pain 2011   per x-ray - Multilevel spondylosis  . Morbid obesity ([facility])   . Nephrolithiasis   . OSA on CPAP 2008  . Pneumonia   . Recurrent boils    "SLUSTER BOILS" ON ABD AND LABIA  . Sleep apnea    USES C-PAP  . Torn rotator cuff RT SHOULDER  . Tremor CCENTRAL NERVOUS SYSTEM   Past Surgical History:  Procedure Laterality Date  . CARPAL TUNNEL RELEASE Left 2006  . ELBOW SURGERY Left   . HAND SURGERY Right RIGHT  . JOINT  REPLACEMENT Right    shoulder  . KNEE ARTHROSCOPY Left   . LUMBAR LAMINECTOMY    . MASS EXCISION  06/28/2012   Procedure: EXCISION MASS;  Surgeon: Harl Bowie, MD;  Location: [facility];  Service: General;  Laterality: Left;  excision left upper quadrant abdominal wall mass, Benign Lipoma  . NASAL SINUS SURGERY    . PILONIDAL CYST EXCISION  [id number]  . TUBAL LIGATION     Social History   Tobacco Use  . Smoking status: Former Smoker    Types: Cigarettes    Last attempt to quit: 10/17/1996    Years since quitting: 21.1  . Smokeless tobacco: Never Used  Substance Use Topics  . Alcohol use: No     ROS:  As above   Medications: Current Outpatient Medications  Medication Sig Dispense Refill  . albuterol (PROAIR HFA) 108 (90 Base) MCG/ACT inhaler inhale 2 puffs by mouth every 6 hours if needed FOR WHEEZING OR SHORTNESS OF BREATH 8.5 g 5  . AMBULATORY NON FORMULARY MEDICATION Diabetic shoes  Dx: Diabetes, uncontrolled 250.60 with neuropathy. 1 Device 0  . AMBULATORY NON FORMULARY MEDICATION Test strips and needles for one touch Verio IQ glucometer.  Use to test blood sugar up to four times a day. Diagnosis Type  2 Diabetes 100 each 3  . AMBULATORY NON FORMULARY MEDICATION 4 prong cane DX R06.89 1 each 0  . azelastine (ASTELIN) 0.1 % nasal spray Place 2 sprays into both nostrils 2 (two) times daily. Use in each nostril as directed 30 mL 6  . buPROPion (WELLBUTRIN XL) 150 MG 24 hr tablet Take 1 tablet (150 mg total) by mouth daily. 30 tablet 2  . Capsaicin 0.033 % CREA Apply 1 inch topically 4 (four) times daily. 56.6 g 12  . DULoxetine (CYMBALTA) 60 MG capsule Take 2 capsules (120 mg total) by mouth daily. 60 capsule 5  . Fluticasone-Salmeterol (ADVAIR DISKUS) 250-50 MCG/DOSE AEPB inhale 1 puff by mouth twice a day 60 each 11  . furosemide (LASIX) 20 MG tablet Take 1 tablet (20 mg total) by mouth daily. 30 tablet 5  . gabapentin (NEURONTIN) 800 MG tablet Take 1 tablet (800 mg total)  by mouth 3 (three) times daily. 90 tablet 5  . glucose blood (ONETOUCH VERIO) test strip Check blood sugar 3 times daily. Diagnosis: Type II diabetes mellitus with neurological manifestations (E11.49) 100 each 3  . HYDROcodone-acetaminophen (NORCO/VICODIN) 5-325 MG tablet TAKE 1 TO 2 TABLETS BY MOUTH three times DAILY as needed 90 tablet 0  . Insulin Glargine (LANTUS SOLOSTAR) 100 UNIT/ML Solostar Pen Inject 40 Units into the skin at bedtime. Please include QS 3 months. 4 pen 2  . Insulin Pen Needle (NOVOFINE) 32G X 6 MM MISC USE 3 TIMES A DAY WITH NOVOLOG 100 each 2  . Insulin Syringe-Needle U-100 (B-D INS SYR ULTRAFINE 1CC/30G) 30G X 1/2" 1 ML MISC use as directed three times a day 100 each 5  . levocetirizine (XYZAL) 5 MG tablet Take 1 tablet (5 mg total) by mouth every evening. 30 tablet 5  . liraglutide (VICTOZA) 18 MG/3ML SOPN inject 1.8 milligram subcutaneously daily 6 mL 2  . LORazepam (ATIVAN) 0.5 MG tablet Take 1 tablet (0.5 mg total) by mouth every 8 (eight) hours as needed for anxiety. 20 tablet 0  . meclizine (ANTIVERT) 25 MG tablet Take 1 tablet (25 mg total) by mouth 3 (three) times daily as needed for dizziness. 30 tablet 0  . meloxicam (MOBIC) 15 MG tablet Take 1 tablet (15 mg total) by mouth daily. 30 tablet 1  . metFORMIN (GLUCOPHAGE) 1000 MG tablet Take 1 tablet (1,000 mg total) by mouth 2 (two) times daily with a meal. 60 tablet 5  . nystatin ointment (MYCOSTATIN) apply to affected area twice a day 60 g 0  . NYSTATIN powder APPLY EXTERNALLY TO THE AFFECTED AREA THREE TIMES DAILY AS DIRECTED 60 g prn  . Olopatadine HCl (PATADAY) 0.2 % SOLN instill 1 drop into both eyes twice a day if needed (Patient taking differently: Place 1 drop into both eyes daily at 6 (six) AM. instill 1 drop into both eyes twice a day if needed) 2.5 mL 3  . pioglitazone (ACTOS) 30 MG tablet TAKE 1 TABLET(30 MG) BY MOUTH DAILY 90 tablet 0  . propranolol (INDERAL) 20 MG tablet Take 1 tablet (20 mg total) by  mouth 2 (two) times daily. 60 tablet 5   No current facility-administered medications for this visit.    Allergies  Allergen Reactions  . Ace Inhibitors Other (See Comments)    Causes blood pressure to drop rapidly  . Atrovent [Ipratropium] Swelling    Throat swelling on Atorvent Nasal Spray  . Cefaclor Hives and Itching  . Shrimp [Shellfish Allergy] Nausea And Vomiting  . Ceclor [  Cefaclor]   . Glyxambi [Empagliflozin-Linagliptin]     Itching/rash  . Sulfa Antibiotics   . Sulfonamide Derivatives Rash     Exam:  BP (!) 181/80   Pulse 85   Wt 278 lb (126.1 kg)   LMP 10/18/1995   BMI 49.25 kg/m  General: Well Developed, well nourished, and in no acute distress.  Neuro/Psych: Alert and oriented x3, extra-ocular muscles intact, able to move all 4 extremities, sensation grossly intact. Skin: Warm and dry, no rashes noted.  Respiratory: Not using accessory muscles, speaking in full sentences, trachea midline.  Cardiovascular: Pulses palpable, no extremity edema. Abdomen: Does not appear distended. HEENT: No significant nasal discharge.  No tongue swelling.  Normal speech. MSK: Right foot: No deformity or skin lesions.  Pulses intact.  Minimally tender Lisfranc area.  Cast removed.  No results found for this or any previous visit (from the past 48 hour(s)). No results found.    Assessment and Plan: 62 y.o. female with right foot pain due to Lisfranc dislocation Freiberg's disease.  Plan for surgery later this week.  Recheck as needed.  Allergy to Atrovent nasal spray documented.  Continue Astelin for vasomotor rhinitis     No orders of the defined types were placed in this encounter.  No orders of the defined types were placed in this encounter.   Discussed warning signs or symptoms. Please see discharge instructions. Patient expresses understanding.

## 2017-12-12 ENCOUNTER — Telehealth: Payer: Self-pay | Admitting: *Deleted

## 2017-12-12 ENCOUNTER — Encounter: Payer: Self-pay | Admitting: Podiatry

## 2017-12-12 NOTE — Telephone Encounter (Signed)
Surgery benefits for CPT code 28730 (lisfance joint fusion): authorization not required. The plan pays 100% after $215 facility fee. There is no deductible.

## 2017-12-12 NOTE — Patient Instructions (Signed)
Surgery consent form reviewed for fusion/ORIF of Lisfranc joint right injured foot. All questions answered.

## 2017-12-13 NOTE — Telephone Encounter (Signed)
Called pt and discussed surgery benefits. Pt would like to proceed with surgery on 12/15/2017. Information faxed to [facility].

## 2017-12-14 ENCOUNTER — Other Ambulatory Visit: Payer: Self-pay | Admitting: Podiatry

## 2017-12-14 MED ORDER — HYDROCODONE-IBUPROFEN 7.5-200 MG PO TABS: 1.0000 | ORAL_TABLET | Freq: Four times a day (QID) | ORAL | 0 refills | Status: DC | PRN

## 2017-12-15 ENCOUNTER — Telehealth: Payer: Self-pay | Admitting: *Deleted

## 2017-12-15 ENCOUNTER — Telehealth: Payer: Self-pay

## 2017-12-15 DIAGNOSIS — M79671 Pain in right foot: Secondary | ICD-10-CM

## 2017-12-15 DIAGNOSIS — S93326A Dislocation of tarsometatarsal joint of unspecified foot, initial encounter: Secondary | ICD-10-CM | POA: Diagnosis not present

## 2017-12-15 MED ORDER — MELOXICAM 15 MG PO TABS: 15.0000 mg | ORAL_TABLET | ORAL | 2 refills | Status: DC | PRN

## 2017-12-15 NOTE — Addendum Note (Signed)
Addended by: Tommas Olp B on: 12/15/2017 05:26 PM   Modules accepted: Orders

## 2017-12-15 NOTE — Telephone Encounter (Signed)
Patient would like a prescription for a knee scooter sent to [facility] for rental to [place] on [address].

## 2017-12-15 NOTE — Telephone Encounter (Signed)
Pharmacy called to verify that Dr. Caffie Pinto was aware that the patient did receive 90 hydrocodone on 12/06/17. I did tell pharmacist that this information is visible and documented in the patients chart. ( the patient received 90 tablets from her family medicine office with directions to take 1-2 three times a day on 12/06/17

## 2017-12-15 NOTE — Telephone Encounter (Signed)
Ok to refill x 2  

## 2017-12-15 NOTE — Telephone Encounter (Signed)
Debbie/Pharmacist - [facility] - contacted our office advising  Rx Vicoprofen 7.5-200 mg has been received from [facility] for patient.  Per provider - advised pharmacist to decline filling Rx due to Rx Norco #90 [id number] filled on 12/06/17.  Patient called in  - advised of Rx medication denial - requesting Rx Meloxicam.   Advised message will be forwarded to provider for review. Patient stated she understood - no further questions.

## 2017-12-15 NOTE — Telephone Encounter (Signed)
Patient requesting medication on Meloxicam - please advise.

## 2017-12-15 NOTE — Telephone Encounter (Signed)
Agree with declining vicoprofen due to having rx for norco active.

## 2017-12-18 NOTE — Telephone Encounter (Signed)
We can write in a prescription pad for it.

## 2017-12-18 NOTE — Telephone Encounter (Signed)
Ok thanks. She could continue use old meds.

## 2017-12-19 ENCOUNTER — Ambulatory Visit: Payer: Self-pay | Admitting: Family Medicine

## 2017-12-19 ENCOUNTER — Other Ambulatory Visit: Payer: Self-pay | Admitting: Physician Assistant

## 2017-12-19 DIAGNOSIS — F331 Major depressive disorder, recurrent, moderate: Secondary | ICD-10-CM

## 2017-12-20 ENCOUNTER — Ambulatory Visit (INDEPENDENT_AMBULATORY_CARE_PROVIDER_SITE_OTHER): Payer: Medicare HMO | Admitting: Podiatry

## 2017-12-20 ENCOUNTER — Encounter: Payer: Self-pay | Admitting: Podiatry

## 2017-12-20 DIAGNOSIS — S93324A Dislocation of tarsometatarsal joint of right foot, initial encounter: Secondary | ICD-10-CM

## 2017-12-20 NOTE — Progress Notes (Signed)
5 day S/P Lisfranc joint fusion 1st, 2nd Metatarsal and Medial and Middle cuneiform right foot. Denies any discomfort other than back of cast is little long and unable to bend the knee all the way but able to bend a half way. No other discomfort. Colors on digits normal and able to wiggle. No fever or chills reported. Patient is able to tolerate the cast for time being. Will replace next week.

## 2017-12-20 NOTE — Patient Instructions (Signed)
5 day post op visit right foot. No acute issues other than the cast is little too long on back of knee. Will replace next week.

## 2017-12-22 ENCOUNTER — Telehealth: Payer: Self-pay

## 2017-12-22 DIAGNOSIS — E1343 Other specified diabetes mellitus with diabetic autonomic (poly)neuropathy: Secondary | ICD-10-CM

## 2017-12-22 MED ORDER — CIPROFLOXACIN HCL 500 MG PO TABS: 500.0000 mg | ORAL_TABLET | Freq: Two times a day (BID) | ORAL | 0 refills | Status: DC

## 2017-12-22 MED ORDER — METRONIDAZOLE 500 MG PO TABS: 500.0000 mg | ORAL_TABLET | Freq: Three times a day (TID) | ORAL | 0 refills | Status: DC

## 2017-12-22 NOTE — Telephone Encounter (Signed)
[name] Rice is having a Gastroparesis flare with diarrhea and pelvic pain. Denies fever, chills or sweats. She would like a round of Flagyl sent to [facility].

## 2017-12-22 NOTE — Telephone Encounter (Signed)
Sent cipro and flagyl for diverticulitis flare.

## 2017-12-22 NOTE — Telephone Encounter (Signed)
Did this rx ever get written for this patient?

## 2017-12-27 ENCOUNTER — Ambulatory Visit (INDEPENDENT_AMBULATORY_CARE_PROVIDER_SITE_OTHER): Payer: Medicare HMO | Admitting: Podiatry

## 2017-12-27 DIAGNOSIS — S99921A Unspecified injury of right foot, initial encounter: Secondary | ICD-10-CM | POA: Diagnosis not present

## 2017-12-27 DIAGNOSIS — S93324A Dislocation of tarsometatarsal joint of right foot, initial encounter: Secondary | ICD-10-CM

## 2018-01-01 ENCOUNTER — Encounter: Payer: Self-pay | Admitting: Podiatry

## 2018-01-01 NOTE — Patient Instructions (Signed)
Right lower limb cast removed and replaced. Maintain semi weight bearing using walker. Return in 3 weeks.

## 2018-01-01 NOTE — Progress Notes (Signed)
2 weeks S/P Lisfranc joint fusion 1st, 2nd Metatarsal and Medial and Middle cuneiform right foot 12/15/17. Patient had problem with the cast being too high in back of knee. No other complaints. Cast removed. Normal wound healing noted. Post op x-ray taken.  Internal fixation screws and plate in place. Distracted lisfranc joint is in good approximation. Right lower limb placed back in Fiber glass cast. Instructed to do semi weight bearing. Return in 3 weeks if no other problem arise.

## 2018-01-03 ENCOUNTER — Other Ambulatory Visit: Payer: Self-pay

## 2018-01-03 DIAGNOSIS — G894 Chronic pain syndrome: Secondary | ICD-10-CM

## 2018-01-03 DIAGNOSIS — M542 Cervicalgia: Secondary | ICD-10-CM

## 2018-01-03 DIAGNOSIS — M545 Low back pain: Secondary | ICD-10-CM

## 2018-01-03 MED ORDER — HYDROCODONE-ACETAMINOPHEN 5-325 MG PO TABS: ORAL_TABLET | ORAL | 0 refills | Status: DC

## 2018-01-03 NOTE — Telephone Encounter (Signed)
Daurice requests a refill on Hydrocodone.

## 2018-01-17 ENCOUNTER — Encounter: Payer: Self-pay | Admitting: Physician Assistant

## 2018-01-17 ENCOUNTER — Ambulatory Visit (INDEPENDENT_AMBULATORY_CARE_PROVIDER_SITE_OTHER): Payer: Medicare HMO | Admitting: Physician Assistant

## 2018-01-17 VITALS — BP 149/60 | HR 83 | Ht 63.0 in | Wt 272.0 lb

## 2018-01-17 DIAGNOSIS — E1142 Type 2 diabetes mellitus with diabetic polyneuropathy: Secondary | ICD-10-CM | POA: Diagnosis not present

## 2018-01-17 DIAGNOSIS — R5383 Other fatigue: Secondary | ICD-10-CM | POA: Diagnosis not present

## 2018-01-17 DIAGNOSIS — L299 Pruritus, unspecified: Secondary | ICD-10-CM | POA: Diagnosis not present

## 2018-01-17 DIAGNOSIS — Z13220 Encounter for screening for lipoid disorders: Secondary | ICD-10-CM | POA: Diagnosis not present

## 2018-01-17 DIAGNOSIS — Z131 Encounter for screening for diabetes mellitus: Secondary | ICD-10-CM

## 2018-01-17 DIAGNOSIS — R69 Illness, unspecified: Secondary | ICD-10-CM | POA: Diagnosis not present

## 2018-01-17 DIAGNOSIS — F418 Other specified anxiety disorders: Secondary | ICD-10-CM

## 2018-01-17 DIAGNOSIS — G894 Chronic pain syndrome: Secondary | ICD-10-CM

## 2018-01-17 DIAGNOSIS — E118 Type 2 diabetes mellitus with unspecified complications: Secondary | ICD-10-CM

## 2018-01-17 DIAGNOSIS — Z794 Long term (current) use of insulin: Secondary | ICD-10-CM | POA: Diagnosis not present

## 2018-01-17 DIAGNOSIS — R0602 Shortness of breath: Secondary | ICD-10-CM

## 2018-01-17 DIAGNOSIS — I10 Essential (primary) hypertension: Secondary | ICD-10-CM

## 2018-01-17 LAB — POCT GLYCOSYLATED HEMOGLOBIN (HGB A1C): HEMOGLOBIN A1C: 6.1

## 2018-01-17 MED ORDER — OLMESARTAN MEDOXOMIL 20 MG PO TABS: 20.0000 mg | ORAL_TABLET | Freq: Every day | ORAL | 2 refills | Status: DC

## 2018-01-17 MED ORDER — LIRAGLUTIDE 18 MG/3ML ~~LOC~~ SOPN: PEN_INJECTOR | SUBCUTANEOUS | 2 refills | Status: DC

## 2018-01-17 MED ORDER — HYDROCORTISONE-ACETIC ACID 1-2 % OT SOLN: 5.0000 [drp] | Freq: Two times a day (BID) | OTIC | 0 refills | Status: DC

## 2018-01-17 MED ORDER — DULOXETINE HCL 60 MG PO CPEP
120.0000 mg | ORAL_CAPSULE | Freq: Every day | ORAL | 5 refills | Status: DC
Start: 2018-01-17 — End: 2018-08-27

## 2018-01-17 MED ORDER — PIOGLITAZONE HCL 30 MG PO TABS: 30.0000 mg | ORAL_TABLET | Freq: Every day | ORAL | 1 refills | Status: DC

## 2018-01-17 NOTE — Patient Instructions (Signed)
Start benicar recheck BP in 1 month.

## 2018-01-17 NOTE — Progress Notes (Signed)
Subjective:    Patient ID: [name] Rice, female    DOB: 07-24-1956, 62 y.o.   MRN: [id number]  HPI Patient is a 62 yo morbidly obese female who is here for follow up on DM. She states she has been doing well and has recently been controlling her blood sugars very well. She has not been taking insulin over the past few days because her sugars have been running low (except last night when she took 20 units of insulin because her sugar was's). She has had a cast on the right leg due to lisfranc fracture and plans to have cast removed soon. She is requesting refills for a couple medicines. She has not been taking lasix because she has not really noticed much swelling in her lower extremities and it has made her wet herself because she can't get to the bathroom fast enough. She also states that she is feeling a flare up of her psoriasis and is requesting the ear drops she has had before, and requesting medicine for dry eyes.   Patient also notes that she feels short of breath when she stands up.   Pt is on chronic pain contract. She does not need refills today but does not want to have to come back in for OV when needed.   . Active Ambulatory Problems    Diagnosis Date Noted  . Type II or unspecified type diabetes mellitus with neurological manifestations, not stated as uncontrolled(250.60) 08/08/2006  . DYSLIPIDEMIA 08/08/2006  . Depression with anxiety 01/28/2008  . Essential hypertension 08/08/2006  . COPD 08/08/2006  . GERD 08/08/2006  . Acute bilateral low back pain 11/27/2007  . BPPV (benign paroxysmal positional vertigo), unspecified laterality 07/14/2009  . OSA on CPAP   . Insomnia 02/06/2011  . Preventative health care 08/16/2011  . History of allergic rhinitis 09/07/2011  . Iron deficiency anemia 03/29/2012  . Lipoma of abdominal wall 05/01/2012  . Intestinal bacterial overgrowth 09/20/2012  . DOE (dyspnea on exertion) 03/31/2013  . Hepatic steatosis 09/26/2013  .  Diverticulitis 11/27/2013  . Diabetic peripheral neuropathy ([place]) 05/30/2014  . Gastroparesis due to secondary diabetes ([place]) 09/16/2014  . Ear pain 12/09/2014  . COPD (chronic obstructive pulmonary disease) with chronic bronchitis ([place]) 12/09/2014  . Type 2 diabetes mellitus with complication ([place]) 34/19/3790  . Diarrhea 01/12/2015  . Non-proliferative diabetic retinopathy, right eye ([place]) 11/23/2015  . Chronic pain syndrome 12/15/2015  . Morbid obesity due to excess calories ([place]) 02/16/2016  . Right shoulder pain 02/16/2016  . Unstable balance 02/16/2016  . Intertrigo 03/15/2016  . Left knee pain 03/15/2016  . Post-menopausal 03/16/2016  . Acute reaction to stress 03/28/2016  . Dystonia 09/21/2016  . Neck pain 07/11/2017  . Lisfranc's dislocation, right, initial encounter 10/06/2017  . Freiberg's disease, right 10/06/2017  . Vasomotor rhinitis 11/28/2017  . Ear itching 01/17/2018  . No energy 01/17/2018   Resolved Ambulatory Problems    Diagnosis Date Noted  . IRON DEFICIENCY 01/30/2007  . DEPRESSION 08/08/2006  . TORTICOLLIS, SPASMODIC 02/13/2007  . SINUSITIS, ACUTE 05/27/2009  . ALLERGIC RHINITIS 12/25/2006  . ASTHMA 01/28/2008  . DIVERTICULOSIS, COLON 02/12/2003  . DERMATITIS, SEBORRHEIC 01/30/2007  . GOUT, HX OF 08/08/2006  . PERSONAL HX COLONIC POLYPS 09/16/2008  . NEPHROLITHIASIS, HX OF 01/28/2008  . Calf pain 11/29/2010  . Tremor, essential 02/06/2011  . Seborrheic dermatitis, unspecified 04/06/2011  . Finger pain 04/06/2011  . Shoulder pain, right 08/16/2011  . Left leg pain 04/10/2012  . Right ear pain  05/01/2012  . Seborrheic dermatitis 05/01/2012  . Hypoglycemia 05/01/2012  . Vaginal candidiasis 08/22/2012  . Sigmoid diverticulitis 09/15/2012  . Acute-on-chronic kidney disease Stage II 09/15/2012  . Cyst, breast 03/01/2013  . Lactic acidosis 03/31/2013  . Acute renal insufficiency 03/31/2013  . Abdominal pain, left lower quadrant 04/29/2013  . Gout  05/01/2013  . Fall 03/16/2016  . Foot fracture, right, closed, initial encounter 08/23/2017   Past Medical History:  Diagnosis Date  . Anxiety   . Arthritis   . Asthma   . Cervical dystonia   . COPD (chronic obstructive pulmonary disease) ([place])   . Depression   . Diabetes mellitus   . Diarrhea   . Diverticulitis   . Diverticulosis   . Dizziness   . Gastroparesis   . GERD (gastroesophageal reflux disease)   . History of colonic polyps   . History of kidney stones   . History of seborrheic dermatitis   . HLD (hyperlipidemia)   . Hypertension   . Intussusception ([place])   . Lisfranc's sprain, left, initial encounter 10/06/2017  . Lumbar pain 2011  . Morbid obesity ([place])   . Nephrolithiasis   . OSA on CPAP 2008  . Pneumonia   . Recurrent boils   . Sleep apnea   . Torn rotator cuff RT SHOULDER  . Tremor CCENTRAL NERVOUS SYSTEM      Review of Systems  Constitutional: Negative for chills, fatigue and fever.  HENT:       Ear discomfort related to psoriasis  Eyes:       Eye dryness  Respiratory: Positive for shortness of breath.   Cardiovascular: Negative for chest pain and leg swelling.  Gastrointestinal: Negative for constipation, nausea and vomiting.       Objective:   Physical Exam  Constitutional: She is oriented to person, place, and time. She appears well-developed and well-nourished.  obese  HENT:  Head: Normocephalic and atraumatic.  Right Ear: External ear normal.  Left Ear: External ear normal.  Eyes: Conjunctivae are normal.  Neck: Normal range of motion. Neck supple.  Cardiovascular: Normal rate, regular rhythm and normal heart sounds.  No murmur heard. Pulmonary/Chest: Effort normal and breath sounds normal. No respiratory distress. She has no wheezes.  Abdominal: Soft. Bowel sounds are normal. She exhibits distension. She exhibits no mass. There is no tenderness. There is no rebound and no guarding.  Musculoskeletal:  Cast on right lower  extremity; no edema noted to left lower extremity  Neurological: She is alert and oriented to person, place, and time.  Skin: Skin is warm and dry.  Psychiatric: She has a normal mood and affect. Her behavior is normal.          Assessment & Plan:  Marland KitchenMarland KitchenDiagnoses and all orders for this visit:  Type 2 diabetes mellitus with complication, with long-term current use of insulin (HCC) -     POCT HgB A1C -     pioglitazone (ACTOS) 30 MG tablet; Take 1 tablet (30 mg total) by mouth daily. -     Lipid Panel w/reflex Direct LDL -     CBC -     TSH -     B12 -     Vitamin D 1,25 dihydroxy -     Ferritin -     olmesartan (BENICAR) 20 MG tablet; Take 1 tablet (20 mg total) by mouth daily.  Diabetic peripheral neuropathy (HCC) -     DULoxetine (CYMBALTA) 60 MG capsule; Take 2 capsules (120 mg total)  by mouth daily. -     liraglutide (VICTOZA) 18 MG/3ML SOPN; inject 1.8 milligram subcutaneously daily -     pioglitazone (ACTOS) 30 MG tablet; Take 1 tablet (30 mg total) by mouth daily. -     B12  Screening for diabetes mellitus -     COMPLETE METABOLIC PANEL WITH GFR  Screening for lipid disorders -     Lipid Panel w/reflex Direct LDL  No energy -     CBC -     TSH -     B12 -     Vitamin D 1,25 dihydroxy -     Ferritin  Essential hypertension -     olmesartan (BENICAR) 20 MG tablet; Take 1 tablet (20 mg total) by mouth daily.  Chronic pain syndrome  Depression with anxiety  Ear itching -     acetic acid-hydrocortisone (VOSOL-HC) OTIC solution; Place 5 drops into both ears 2 (two) times daily.  SOB (shortness of breath) on exertion   .Marland Kitchen Results for orders placed or performed in visit on 01/17/18  POCT HgB A1C  Result Value Ref Range   Hemoglobin A1C 6.1    A!C really good.  Continue with oral medications and victoza.  BP elevated. Added benicar.  On STATIN.  Vaccines up to date.   Fasting labs ordered and labs to evaluate fatigue and see if anything we can do to  improve.  Hopefully getting her cast off will help her mood and energy as well.   She has not been very active since her fracture. Likely SOB on exertion is due to physical deconditioning. Pulse with ambulation is 99 percent. This is reassuring.   [place] controlled substance database reviewed with no concerns and appropriate refills. Up to date pain contract and UDS. Can refill hydrocodone for 3 months.   FOLLOW up in 1 month for BP recheck .

## 2018-01-18 ENCOUNTER — Ambulatory Visit (INDEPENDENT_AMBULATORY_CARE_PROVIDER_SITE_OTHER): Payer: Medicare HMO | Admitting: Podiatry

## 2018-01-18 ENCOUNTER — Encounter: Payer: Self-pay | Admitting: Podiatry

## 2018-01-18 DIAGNOSIS — Z9889 Other specified postprocedural states: Secondary | ICD-10-CM

## 2018-01-18 NOTE — Progress Notes (Signed)
5 weeks S/P Lisfranc joint fusion 1st, 2nd Metatarsal and Medial and Middle cuneiform right foot 12/15/17. Been in BK fiberglass cast and ambulated with aid of walker. Denies any discomfort at surgical site but complain of pain in the 2nd toe. Cast removed. Incision site dry and well approximated. Noted of dry red crust with mild erythema at distal 1/4 of incision line. Right lower limb soaked and cleansed. Amerigel ointment dressing applied over incision line. May replace dressing after each shower if needed. Right foot placed in CAM walker. Continue with light activity with minimum ambulation. May try regular shoe after 3 weeks. Return in one month

## 2018-01-18 NOTE — Patient Instructions (Signed)
S/P 5 weeks Lisfranc joint fusion right foot. Cast removed, cleansed and re dressed with Amerigel ointment. Right foot placed in CAM walker. Continue with light activity with minimum ambulation. May try regular shoe after 3 weeks. Return in one month

## 2018-01-24 ENCOUNTER — Encounter: Payer: Medicare HMO | Admitting: Podiatry

## 2018-02-06 ENCOUNTER — Other Ambulatory Visit: Payer: Self-pay

## 2018-02-06 DIAGNOSIS — G249 Dystonia, unspecified: Secondary | ICD-10-CM

## 2018-02-06 DIAGNOSIS — M545 Low back pain: Secondary | ICD-10-CM

## 2018-02-06 DIAGNOSIS — G894 Chronic pain syndrome: Secondary | ICD-10-CM

## 2018-02-06 DIAGNOSIS — M542 Cervicalgia: Secondary | ICD-10-CM

## 2018-02-06 NOTE — Telephone Encounter (Signed)
Azjah requests refill on lorazepam and hydrocodone.

## 2018-02-07 MED ORDER — LORAZEPAM 0.5 MG PO TABS: 0.5000 mg | ORAL_TABLET | Freq: Three times a day (TID) | ORAL | 0 refills | Status: DC | PRN

## 2018-02-07 MED ORDER — HYDROCODONE-ACETAMINOPHEN 5-325 MG PO TABS: ORAL_TABLET | ORAL | 0 refills | Status: DC

## 2018-02-14 ENCOUNTER — Ambulatory Visit: Payer: Self-pay | Admitting: Physician Assistant

## 2018-02-16 ENCOUNTER — Ambulatory Visit (INDEPENDENT_AMBULATORY_CARE_PROVIDER_SITE_OTHER): Payer: Medicare HMO | Admitting: Physician Assistant

## 2018-02-16 ENCOUNTER — Encounter: Payer: Self-pay | Admitting: Physician Assistant

## 2018-02-16 VITALS — BP 129/68 | HR 96 | Ht 63.0 in | Wt 272.0 lb

## 2018-02-16 DIAGNOSIS — R232 Flushing: Secondary | ICD-10-CM

## 2018-02-16 DIAGNOSIS — E1142 Type 2 diabetes mellitus with diabetic polyneuropathy: Secondary | ICD-10-CM

## 2018-02-16 DIAGNOSIS — H00012 Hordeolum externum right lower eyelid: Secondary | ICD-10-CM | POA: Diagnosis not present

## 2018-02-16 DIAGNOSIS — R0602 Shortness of breath: Secondary | ICD-10-CM

## 2018-02-16 DIAGNOSIS — I10 Essential (primary) hypertension: Secondary | ICD-10-CM

## 2018-02-16 MED ORDER — AMBULATORY NON FORMULARY MEDICATION: 3 refills | Status: DC

## 2018-02-16 MED ORDER — AMBULATORY NON FORMULARY MEDICATION: 0 refills | Status: DC

## 2018-02-16 MED ORDER — HYDROXYZINE PAMOATE 25 MG PO CAPS: 25.0000 mg | ORAL_CAPSULE | Freq: Three times a day (TID) | ORAL | 0 refills | Status: DC | PRN

## 2018-02-16 NOTE — Progress Notes (Signed)
Subjective:    Patient ID: [name] Rice, female    DOB: December 19, 1955, 62 y.o.   MRN: [id number]  HPI  Pt is a 62 yo female with DM, HTN, dyslipidemia who presents to the clinic for BP follow up.   At last visit she was started on benicar for elevated BP readings for the past few months. She is doing great on benicar today. Denies any side effects. Not checking BP at home. She continues to feel SOB just when she gets up and starts walking. She has hx of BPPV and had a recent episode that resolved with meclizine.   Pt also has a painful bump on right lower eyelid. She tried allergy drops but that made it worse. Now using Vaseline and helps a lot. She does feel like it is becoming less painful. No fever, chills, eye discharge, sinus pressure or ear pain.   Pt request an rx for DM shoes and new rx for cartridge for lancets.   .. Active Ambulatory Problems    Diagnosis Date Noted  . Type II or unspecified type diabetes mellitus with neurological manifestations, not stated as uncontrolled(250.60) 08/08/2006  . DYSLIPIDEMIA 08/08/2006  . Depression with anxiety 01/28/2008  . Essential hypertension 08/08/2006  . COPD 08/08/2006  . GERD 08/08/2006  . Acute bilateral low back pain 11/27/2007  . BPPV (benign paroxysmal positional vertigo), unspecified laterality 07/14/2009  . OSA on CPAP   . Insomnia 02/06/2011  . Preventative health care 08/16/2011  . History of allergic rhinitis 09/07/2011  . Iron deficiency anemia 03/29/2012  . Lipoma of abdominal wall 05/01/2012  . Intestinal bacterial overgrowth 09/20/2012  . SOB (shortness of breath) on exertion 03/31/2013  . Hepatic steatosis 09/26/2013  . Diverticulitis 11/27/2013  . Diabetic peripheral neuropathy ([place]) 05/30/2014  . Gastroparesis due to secondary diabetes ([place]) 09/16/2014  . Ear pain 12/09/2014  . COPD (chronic obstructive pulmonary disease) with chronic bronchitis ([place]) 12/09/2014  . Type 2 diabetes mellitus with complication  ([place]) 63/87/5643  . Diarrhea 01/12/2015  . Non-proliferative diabetic retinopathy, right eye ([place]) 11/23/2015  . Chronic pain syndrome 12/15/2015  . Morbid obesity due to excess calories ([place]) 02/16/2016  . Right shoulder pain 02/16/2016  . Unstable balance 02/16/2016  . Intertrigo 03/15/2016  . Left knee pain 03/15/2016  . Post-menopausal 03/16/2016  . Acute reaction to stress 03/28/2016  . Dystonia 09/21/2016  . Neck pain 07/11/2017  . Lisfranc's dislocation, right, initial encounter 10/06/2017  . Freiberg's disease, right 10/06/2017  . Vasomotor rhinitis 11/28/2017  . Ear itching 01/17/2018  . No energy 01/17/2018  . Hordeolum externum of right lower eyelid 02/16/2018  . Hot flashes 02/16/2018   Resolved Ambulatory Problems    Diagnosis Date Noted  . IRON DEFICIENCY 01/30/2007  . DEPRESSION 08/08/2006  . TORTICOLLIS, SPASMODIC 02/13/2007  . SINUSITIS, ACUTE 05/27/2009  . ALLERGIC RHINITIS 12/25/2006  . ASTHMA 01/28/2008  . DIVERTICULOSIS, COLON 02/12/2003  . DERMATITIS, SEBORRHEIC 01/30/2007  . GOUT, HX OF 08/08/2006  . PERSONAL HX COLONIC POLYPS 09/16/2008  . NEPHROLITHIASIS, HX OF 01/28/2008  . Calf pain 11/29/2010  . Tremor, essential 02/06/2011  . Seborrheic dermatitis, unspecified 04/06/2011  . Finger pain 04/06/2011  . Shoulder pain, right 08/16/2011  . Left leg pain 04/10/2012  . Right ear pain 05/01/2012  . Seborrheic dermatitis 05/01/2012  . Hypoglycemia 05/01/2012  . Vaginal candidiasis 08/22/2012  . Sigmoid diverticulitis 09/15/2012  . Acute-on-chronic kidney disease Stage II 09/15/2012  . Cyst, breast 03/01/2013  . Lactic acidosis 03/31/2013  .  Acute renal insufficiency 03/31/2013  . Abdominal pain, left lower quadrant 04/29/2013  . Gout 05/01/2013  . Fall 03/16/2016  . Foot fracture, right, closed, initial encounter 08/23/2017   Past Medical History:  Diagnosis Date  . Anxiety   . Arthritis   . Asthma   . Cervical dystonia   . COPD (chronic  obstructive pulmonary disease) ([place])   . Depression   . Diabetes mellitus   . Diarrhea   . Diverticulitis   . Diverticulosis   . Dizziness   . Gastroparesis   . GERD (gastroesophageal reflux disease)   . History of colonic polyps   . History of kidney stones   . History of seborrheic dermatitis   . HLD (hyperlipidemia)   . Hypertension   . Intussusception ([place])   . Lisfranc's sprain, left, initial encounter 10/06/2017  . Lumbar pain 2011  . Morbid obesity ([place])   . Nephrolithiasis   . OSA on CPAP 2008  . Pneumonia   . Recurrent boils   . Sleep apnea   . Torn rotator cuff RT SHOULDER  . Tremor [place]      Review of Systems  All other systems reviewed and are negative.      Objective:   Physical Exam  Constitutional: She is oriented to person, place, and time. She appears well-developed and well-nourished.  Obese.   HENT:  Head: Normocephalic and atraumatic.  Eyes: Pupils are equal, round, and reactive to light. EOM are normal.  Cardiovascular: Normal rate and regular rhythm.  Pulmonary/Chest: Effort normal and breath sounds normal.  Neurological: She is alert and oriented to person, place, and time.  Skin: Skin is warm.          Assessment & Plan:  Marland KitchenMarland KitchenAlayzia was seen today for hypertension and stye.  Diagnoses and all orders for this visit:  Essential hypertension  Hordeolum externum of right lower eyelid  Diabetic peripheral neuropathy ([place]) -     AMBULATORY NON FORMULARY MEDICATION; Diabetic shoes  Dx: Diabetes, uncontrolled 250.60 with neuropathy. -     AMBULATORY NON FORMULARY MEDICATION; Test strips and needles for one touch Verio IQ glucometer.  Use to test blood sugar up to four times a day. Include all supplies needed including cartridge for lancets.  Diagnosis Type 2 Diabetes  Hot flashes -     hydrOXYzine (VISTARIL) 25 MG capsule; Take 1 capsule (25 mg total) by mouth 3 (three) times daily as needed.  SOB (shortness of  breath) on exertion   BP looks great. Continue on benicar. Follow up in 2 months with DM recheck.   Discussed warm compresses for stye. HO given. Follow up as needed or if symptoms worsening.   Reassured patient I believe SOB is due to physical deconditioning. Pulse ox lung and heart exam sound great today.   Hot flashes could be linked to anxiety. Limit use of ativan due to being on opioids. Vistaril given for as needed usage.   New rx printed.   Marland Kitchen.Spent 30 minutes with patient and greater than 50 percent of visit spent counseling patient regarding treatment plan.

## 2018-02-16 NOTE — Patient Instructions (Signed)

## 2018-02-20 ENCOUNTER — Ambulatory Visit: Payer: Medicare HMO | Admitting: Podiatry

## 2018-02-23 ENCOUNTER — Telehealth: Payer: Self-pay | Admitting: Physician Assistant

## 2018-02-23 DIAGNOSIS — E1142 Type 2 diabetes mellitus with diabetic polyneuropathy: Secondary | ICD-10-CM

## 2018-02-23 DIAGNOSIS — L304 Erythema intertrigo: Secondary | ICD-10-CM

## 2018-02-23 MED ORDER — NYSTATIN 100000 UNIT/GM EX OINT
TOPICAL_OINTMENT | CUTANEOUS | 1 refills | Status: DC
Start: 2018-02-23 — End: 2019-06-18

## 2018-02-23 MED ORDER — HYDROXYZINE HCL 50 MG PO TABS: 50.0000 mg | ORAL_TABLET | Freq: Three times a day (TID) | ORAL | 2 refills | Status: DC | PRN

## 2018-02-23 MED ORDER — NYSTATIN 100000 UNIT/GM EX POWD: CUTANEOUS | 99 refills | Status: DC

## 2018-02-23 MED ORDER — METFORMIN HCL 1000 MG PO TABS: 1000.0000 mg | ORAL_TABLET | Freq: Two times a day (BID) | ORAL | 5 refills | Status: DC

## 2018-02-23 NOTE — Telephone Encounter (Signed)
PT called and she states that her pharmacy said they are out of of stock on the Hydrozine  And need something else called in to replace this med. She also needs her Metformin, Nyastatin Ointment and the powder called in because she is out.  She also states is there something she can take for feeling tired and weak? Please call pt and advise

## 2018-02-23 NOTE — Telephone Encounter (Signed)
I believe only the hydroxizine 25mg  is on back order. Sent 50mg  and can cut in half. Other refills sent.

## 2018-02-23 NOTE — Telephone Encounter (Signed)
Pt advised.

## 2018-02-27 DIAGNOSIS — R69 Illness, unspecified: Secondary | ICD-10-CM | POA: Diagnosis not present

## 2018-02-28 ENCOUNTER — Other Ambulatory Visit: Payer: Self-pay | Admitting: *Deleted

## 2018-02-28 DIAGNOSIS — E1140 Type 2 diabetes mellitus with diabetic neuropathy, unspecified: Secondary | ICD-10-CM

## 2018-02-28 DIAGNOSIS — J301 Allergic rhinitis due to pollen: Secondary | ICD-10-CM

## 2018-02-28 DIAGNOSIS — Z794 Long term (current) use of insulin: Secondary | ICD-10-CM

## 2018-02-28 MED ORDER — GABAPENTIN 800 MG PO TABS: 800.0000 mg | ORAL_TABLET | Freq: Three times a day (TID) | ORAL | 5 refills | Status: DC

## 2018-02-28 MED ORDER — LEVOCETIRIZINE DIHYDROCHLORIDE 5 MG PO TABS: 5.0000 mg | ORAL_TABLET | Freq: Every evening | ORAL | 5 refills | Status: DC

## 2018-03-06 ENCOUNTER — Ambulatory Visit (INDEPENDENT_AMBULATORY_CARE_PROVIDER_SITE_OTHER): Payer: Medicare HMO | Admitting: Podiatry

## 2018-03-06 ENCOUNTER — Encounter: Payer: Self-pay | Admitting: Podiatry

## 2018-03-06 DIAGNOSIS — Z9889 Other specified postprocedural states: Secondary | ICD-10-CM

## 2018-03-06 NOTE — Progress Notes (Signed)
11 S/P Lisfranc joint fusion 1st, 2nd Metatarsal and Medial and Middle cuneiform right foot 12/15/17. Having problem with her Sciatica pain on her back.  Wearing regular shoes at home without difficulty. 2nd MPJ area pain has minimized at this time and does not hinder her activity. Satisfactory recovery from the surgery. May wear any shoe gear that provide comfort and stability. Increase activity as tolerated.

## 2018-03-06 NOTE — Patient Instructions (Signed)
11 weeks post op wound healing normal with improved mobility and minimum pain. Satisfactory recovery. May increase activity as tolerated.

## 2018-03-19 ENCOUNTER — Other Ambulatory Visit: Payer: Self-pay

## 2018-03-19 DIAGNOSIS — M545 Low back pain: Secondary | ICD-10-CM

## 2018-03-19 DIAGNOSIS — M542 Cervicalgia: Secondary | ICD-10-CM

## 2018-03-19 DIAGNOSIS — G894 Chronic pain syndrome: Secondary | ICD-10-CM

## 2018-03-19 MED ORDER — HYDROCODONE-ACETAMINOPHEN 5-325 MG PO TABS: ORAL_TABLET | ORAL | 0 refills | Status: DC

## 2018-03-19 NOTE — Telephone Encounter (Signed)
[name] Rice called and left a message requesting a refill on Hydrocodone.

## 2018-03-21 ENCOUNTER — Encounter: Payer: Self-pay | Admitting: Physician Assistant

## 2018-03-21 ENCOUNTER — Ambulatory Visit (INDEPENDENT_AMBULATORY_CARE_PROVIDER_SITE_OTHER): Payer: Medicare HMO

## 2018-03-21 ENCOUNTER — Ambulatory Visit (INDEPENDENT_AMBULATORY_CARE_PROVIDER_SITE_OTHER): Payer: Medicare HMO | Admitting: Physician Assistant

## 2018-03-21 VITALS — BP 120/65 | HR 73 | Ht 63.0 in

## 2018-03-21 DIAGNOSIS — R0981 Nasal congestion: Secondary | ICD-10-CM

## 2018-03-21 DIAGNOSIS — G4733 Obstructive sleep apnea (adult) (pediatric): Secondary | ICD-10-CM | POA: Diagnosis not present

## 2018-03-21 DIAGNOSIS — J449 Chronic obstructive pulmonary disease, unspecified: Secondary | ICD-10-CM | POA: Diagnosis not present

## 2018-03-21 DIAGNOSIS — Z9989 Dependence on other enabling machines and devices: Secondary | ICD-10-CM

## 2018-03-21 DIAGNOSIS — J302 Other seasonal allergic rhinitis: Secondary | ICD-10-CM

## 2018-03-21 DIAGNOSIS — R0602 Shortness of breath: Secondary | ICD-10-CM

## 2018-03-21 MED ORDER — LORATADINE 10 MG PO CAPS: 10.0000 mg | ORAL_CAPSULE | Freq: Every day | ORAL | 11 refills | Status: DC

## 2018-03-21 MED ORDER — METHYLPREDNISOLONE SODIUM SUCC 125 MG IJ SOLR
125.0000 mg | Freq: Once | INTRAMUSCULAR | Status: AC
  Administered 2018-03-21: 125 mg via INTRAMUSCULAR

## 2018-03-21 NOTE — Progress Notes (Signed)
Subjective:    Patient ID: [name] Rice, female    DOB: 06/29/1956, 62 y.o.   MRN: [id number]  HPI  Pt is a 62 yo morbidly obese female with HTN, COPD, OSA, GERD, T2DM who presents to the clinic to follow up on SOB with exertion.   Pt continues to feel winded and SOB every time she walks or exerts herself. She finds it hard to even make in around her apartment or in the kitchen. She is healing from an lisfranc fracture that has made her mostly immobile. Last echo was 01/2018 with no significant changes. She is using albuterol but does not improve her breathing. Not had spironmetry. She does feel congested nasally. Uses advair. Walk test in office pulse ox did not drop. .   .. Active Ambulatory Problems    Diagnosis Date Noted  . Type II or unspecified type diabetes mellitus with neurological manifestations, not stated as uncontrolled(250.60) 08/08/2006  . DYSLIPIDEMIA 08/08/2006  . Depression with anxiety 01/28/2008  . Essential hypertension 08/08/2006  . COPD 08/08/2006  . GERD 08/08/2006  . Acute bilateral low back pain 11/27/2007  . BPPV (benign paroxysmal positional vertigo), unspecified laterality 07/14/2009  . OSA on CPAP   . Insomnia 02/06/2011  . Preventative health care 08/16/2011  . History of allergic rhinitis 09/07/2011  . Iron deficiency anemia 03/29/2012  . Lipoma of abdominal wall 05/01/2012  . Intestinal bacterial overgrowth 09/20/2012  . SOB (shortness of breath) on exertion 03/31/2013  . Hepatic steatosis 09/26/2013  . Diverticulitis 11/27/2013  . Diabetic peripheral neuropathy ([place]) 05/30/2014  . Gastroparesis due to secondary diabetes ([place]) 09/16/2014  . Ear pain 12/09/2014  . COPD (chronic obstructive pulmonary disease) with chronic bronchitis ([place]) 12/09/2014  . Type 2 diabetes mellitus with complication ([place]) 77/82/4235  . Diarrhea 01/12/2015  . Non-proliferative diabetic retinopathy, right eye ([place]) 11/23/2015  . Chronic pain syndrome 12/15/2015  .  Morbid obesity due to excess calories ([place]) 02/16/2016  . Right shoulder pain 02/16/2016  . Unstable balance 02/16/2016  . Intertrigo 03/15/2016  . Left knee pain 03/15/2016  . Post-menopausal 03/16/2016  . Acute reaction to stress 03/28/2016  . Dystonia 09/21/2016  . Neck pain 07/11/2017  . Lisfranc's dislocation, right, initial encounter 10/06/2017  . Freiberg's disease, right 10/06/2017  . Vasomotor rhinitis 11/28/2017  . Ear itching 01/17/2018  . No energy 01/17/2018  . Hordeolum externum of right lower eyelid 02/16/2018  . Hot flashes 02/16/2018   Resolved Ambulatory Problems    Diagnosis Date Noted  . IRON DEFICIENCY 01/30/2007  . DEPRESSION 08/08/2006  . TORTICOLLIS, SPASMODIC 02/13/2007  . SINUSITIS, ACUTE 05/27/2009  . ALLERGIC RHINITIS 12/25/2006  . ASTHMA 01/28/2008  . DIVERTICULOSIS, COLON 02/12/2003  . DERMATITIS, SEBORRHEIC 01/30/2007  . GOUT, HX OF 08/08/2006  . PERSONAL HX COLONIC POLYPS 09/16/2008  . NEPHROLITHIASIS, HX OF 01/28/2008  . Calf pain 11/29/2010  . Tremor, essential 02/06/2011  . Seborrheic dermatitis, unspecified 04/06/2011  . Finger pain 04/06/2011  . Shoulder pain, right 08/16/2011  . Left leg pain 04/10/2012  . Right ear pain 05/01/2012  . Seborrheic dermatitis 05/01/2012  . Hypoglycemia 05/01/2012  . Vaginal candidiasis 08/22/2012  . Sigmoid diverticulitis 09/15/2012  . Acute-on-chronic kidney disease Stage II 09/15/2012  . Cyst, breast 03/01/2013  . Lactic acidosis 03/31/2013  . Acute renal insufficiency 03/31/2013  . Abdominal pain, left lower quadrant 04/29/2013  . Gout 05/01/2013  . Fall 03/16/2016  . Foot fracture, right, closed, initial encounter 08/23/2017   Past Medical History:  Diagnosis Date  . Anxiety   . Arthritis   . Asthma   . Cervical dystonia   . COPD (chronic obstructive pulmonary disease) ([place])   . Depression   . Diabetes mellitus   . Diarrhea   . Diverticulitis   . Diverticulosis   . Dizziness   .  Gastroparesis   . GERD (gastroesophageal reflux disease)   . History of colonic polyps   . History of kidney stones   . History of seborrheic dermatitis   . HLD (hyperlipidemia)   . Hypertension   . Intussusception ([place])   . Lisfranc's sprain, left, initial encounter 10/06/2017  . Lumbar pain 2011  . Morbid obesity ([place])   . Nephrolithiasis   . OSA on CPAP 2008  . Pneumonia   . Recurrent boils   . Sleep apnea   . Torn rotator cuff RT SHOULDER  . Tremor CCENTRAL NERVOUS SYSTEM      Review of Systems See HPI.     Objective:   Physical Exam  Constitutional: She is oriented to person, place, and time. She appears well-developed and well-nourished.  Using a walker.   HENT:  Head: Normocephalic and atraumatic.  Cardiovascular: Normal rate and regular rhythm.  Pulmonary/Chest: Effort normal and breath sounds normal. No stridor. No respiratory distress. She has no wheezes. She has no rales. She exhibits no tenderness.  Neurological: She is alert and oriented to person, place, and time.  Psychiatric: She has a normal mood and affect. Her behavior is normal.        Assessment & Plan:  Marland KitchenMarland KitchenDiagnoses and all orders for this visit:  COPD (chronic obstructive pulmonary disease) with chronic bronchitis (HCC) -     DG Chest 2 View -     methylPREDNISolone sodium succinate (SOLU-MEDROL) 125 mg/2 mL injection 125 mg  OSA on CPAP -     DG Chest 2 View -     methylPREDNISolone sodium succinate (SOLU-MEDROL) 125 mg/2 mL injection 125 mg  SOB (shortness of breath) on exertion -     DG Chest 2 View -     methylPREDNISolone sodium succinate (SOLU-MEDROL) 125 mg/2 mL injection 125 mg  Seasonal allergies -     Loratadine (CLARITIN) 10 MG CAPS; Take 1 capsule (10 mg total) by mouth daily.  Nasal congestion  pulse ox today is 99 percent. Exertion does not appear to lower pulse ox.  Needs spirometry. Make appt.  Today shot of solumedrol.  Ordered CXR to make sure no changes.   Encouraged to consider flonase daily.  There could be some element of physical deconditioning. I will make referral for PT once patient gets moved into her new appt.  Follow up as needed.

## 2018-03-21 NOTE — Patient Instructions (Signed)
Need spirometry in a few weeks. Marland Kitchen

## 2018-03-22 NOTE — Progress Notes (Signed)
Call pt: CXR looks normal. No acute findings.

## 2018-03-26 ENCOUNTER — Encounter: Payer: Self-pay | Admitting: Physician Assistant

## 2018-03-30 ENCOUNTER — Ambulatory Visit: Payer: Medicare HMO | Admitting: Physician Assistant

## 2018-04-11 ENCOUNTER — Ambulatory Visit (INDEPENDENT_AMBULATORY_CARE_PROVIDER_SITE_OTHER): Payer: Medicare HMO | Admitting: Physician Assistant

## 2018-04-11 VITALS — BP 146/53 | HR 65 | Temp 97.6°F | Ht 63.0 in | Wt 277.0 lb

## 2018-04-11 DIAGNOSIS — Z0000 Encounter for general adult medical examination without abnormal findings: Secondary | ICD-10-CM

## 2018-04-11 DIAGNOSIS — D509 Iron deficiency anemia, unspecified: Secondary | ICD-10-CM | POA: Diagnosis not present

## 2018-04-11 DIAGNOSIS — J984 Other disorders of lung: Secondary | ICD-10-CM | POA: Diagnosis not present

## 2018-04-11 DIAGNOSIS — R7989 Other specified abnormal findings of blood chemistry: Secondary | ICD-10-CM | POA: Diagnosis not present

## 2018-04-11 DIAGNOSIS — H8110 Benign paroxysmal vertigo, unspecified ear: Secondary | ICD-10-CM | POA: Diagnosis not present

## 2018-04-11 DIAGNOSIS — R5383 Other fatigue: Secondary | ICD-10-CM | POA: Diagnosis not present

## 2018-04-11 DIAGNOSIS — R0602 Shortness of breath: Secondary | ICD-10-CM | POA: Diagnosis not present

## 2018-04-11 DIAGNOSIS — E1142 Type 2 diabetes mellitus with diabetic polyneuropathy: Secondary | ICD-10-CM | POA: Diagnosis not present

## 2018-04-11 DIAGNOSIS — E782 Mixed hyperlipidemia: Secondary | ICD-10-CM | POA: Diagnosis not present

## 2018-04-11 DIAGNOSIS — E559 Vitamin D deficiency, unspecified: Secondary | ICD-10-CM | POA: Diagnosis not present

## 2018-04-11 DIAGNOSIS — Z1329 Encounter for screening for other suspected endocrine disorder: Secondary | ICD-10-CM | POA: Diagnosis not present

## 2018-04-11 MED ORDER — ALBUTEROL SULFATE (2.5 MG/3ML) 0.083% IN NEBU
2.5000 mg | INHALATION_SOLUTION | Freq: Once | RESPIRATORY_TRACT | Status: AC
  Administered 2018-04-11: 2.5 mg via RESPIRATORY_TRACT

## 2018-04-11 NOTE — Progress Notes (Signed)
Subjective:    Patient ID: [name] Rice, female    DOB: Nov 14, 1955, 62 y.o.   MRN: [id number]  HPI  Pt is a 62 yo female with dx of COPD since 2005 that is here to have spironmetry and evaluate new SOB with exertion for the last month or so. She has little to no tolerance to any physical activity. She cannot even walk to the kitchen without resting. She get SOB with standing as well. She denies any wheezing. She is on advair daily. When she does exert she feels like her chest is "squeezing". She gets little to no relief from inhaler. She did just get out of a cam boot for a lisfranc's fracture 2 weeks ago. She denies any swelling of legs. Her ankle does hurt with walking.   She smoked for 30 years but has quit for 21 years.   She does mention her BPPV has flared again. She cannot do the exercises herself and it is very hard to get out of the house. She ran out of antivert. Ongoing sinus congestion but no acute worsening of symptoms.    .. Active Ambulatory Problems    Diagnosis Date Noted  . Type II or unspecified type diabetes mellitus with neurological manifestations, not stated as uncontrolled(250.60) 08/08/2006  . DYSLIPIDEMIA 08/08/2006  . Depression with anxiety 01/28/2008  . Essential hypertension 08/08/2006  . COPD 08/08/2006  . GERD 08/08/2006  . Acute bilateral low back pain 11/27/2007  . BPPV (benign paroxysmal positional vertigo), unspecified laterality 07/14/2009  . OSA on CPAP   . Insomnia 02/06/2011  . Preventative health care 08/16/2011  . History of allergic rhinitis 09/07/2011  . Iron deficiency anemia 03/29/2012  . Lipoma of abdominal wall 05/01/2012  . Intestinal bacterial overgrowth 09/20/2012  . SOB (shortness of breath) on exertion 03/31/2013  . Hepatic steatosis 09/26/2013  . Diverticulitis 11/27/2013  . Diabetic peripheral neuropathy ([place]) 05/30/2014  . Gastroparesis due to secondary diabetes ([place]) 09/16/2014  . Ear pain 12/09/2014  . COPD (chronic  obstructive pulmonary disease) with chronic bronchitis ([place]) 12/09/2014  . Type 2 diabetes mellitus with complication ([place]) 95/18/8416  . Diarrhea 01/12/2015  . Non-proliferative diabetic retinopathy, right eye ([place]) 11/23/2015  . Chronic pain syndrome 12/15/2015  . Morbid obesity due to excess calories ([place]) 02/16/2016  . Right shoulder pain 02/16/2016  . Unstable balance 02/16/2016  . Intertrigo 03/15/2016  . Left knee pain 03/15/2016  . Post-menopausal 03/16/2016  . Acute reaction to stress 03/28/2016  . Dystonia 09/21/2016  . Neck pain 07/11/2017  . Lisfranc's dislocation, right, initial encounter 10/06/2017  . Freiberg's disease, right 10/06/2017  . Vasomotor rhinitis 11/28/2017  . Ear itching 01/17/2018  . No energy 01/17/2018  . Hordeolum externum of right lower eyelid 02/16/2018  . Hot flashes 02/16/2018   Resolved Ambulatory Problems    Diagnosis Date Noted  . IRON DEFICIENCY 01/30/2007  . DEPRESSION 08/08/2006  . TORTICOLLIS, SPASMODIC 02/13/2007  . SINUSITIS, ACUTE 05/27/2009  . ALLERGIC RHINITIS 12/25/2006  . ASTHMA 01/28/2008  . DIVERTICULOSIS, COLON 02/12/2003  . DERMATITIS, SEBORRHEIC 01/30/2007  . GOUT, HX OF 08/08/2006  . PERSONAL HX COLONIC POLYPS 09/16/2008  . NEPHROLITHIASIS, HX OF 01/28/2008  . Calf pain 11/29/2010  . Tremor, essential 02/06/2011  . Seborrheic dermatitis, unspecified 04/06/2011  . Finger pain 04/06/2011  . Shoulder pain, right 08/16/2011  . Left leg pain 04/10/2012  . Right ear pain 05/01/2012  . Seborrheic dermatitis 05/01/2012  . Hypoglycemia 05/01/2012  . Vaginal candidiasis 08/22/2012  .  Sigmoid diverticulitis 09/15/2012  . Acute-on-chronic kidney disease Stage II 09/15/2012  . Cyst, breast 03/01/2013  . Lactic acidosis 03/31/2013  . Acute renal insufficiency 03/31/2013  . Abdominal pain, left lower quadrant 04/29/2013  . Gout 05/01/2013  . Fall 03/16/2016  . Foot fracture, right, closed, initial encounter 08/23/2017    Past Medical History:  Diagnosis Date  . Anxiety   . Arthritis   . Asthma   . Cervical dystonia   . COPD (chronic obstructive pulmonary disease) ([place])   . Depression   . Diabetes mellitus   . Diarrhea   . Diverticulitis   . Diverticulosis   . Dizziness   . Gastroparesis   . GERD (gastroesophageal reflux disease)   . History of colonic polyps   . History of kidney stones   . History of seborrheic dermatitis   . HLD (hyperlipidemia)   . Hypertension   . Intussusception ([place])   . Lisfranc's sprain, left, initial encounter 10/06/2017  . Lumbar pain 2011  . Morbid obesity ([place])   . Nephrolithiasis   . OSA on CPAP 2008  . Pneumonia   . Recurrent boils   . Sleep apnea   . Torn rotator cuff RT SHOULDER  . Tremor CCENTRAL NERVOUS SYSTEM       Review of Systems See HPI.     Objective:   Physical Exam  Constitutional: She appears well-developed.  Morbidly obese. Ambulates with walker.   HENT:  Head: Normocephalic and atraumatic.  Cardiovascular: Normal rate and regular rhythm.  Pulmonary/Chest: Effort normal.  Musculoskeletal:       Right lower leg: Normal. She exhibits no tenderness and no edema.       Left lower leg: Normal. She exhibits no tenderness and no edema.  Right leg calf is smaller than the left due to 6 months in cam boot and muscle atrophy.   Neurological: She is alert.          Assessment & Plan:  Marland KitchenMarland KitchenLeatta was seen today for copd and shortness of breath.  Diagnoses and all orders for this visit:  SOB (shortness of breath) on exertion -     PR EVAL OF BRONCHOSPASM -     albuterol (PROVENTIL) (2.5 MG/3ML) 0.083% nebulizer solution 2.5 mg -     D-Dimer, Quantitative  BPPV (benign paroxysmal positional vertigo), unspecified laterality -     meclizine (ANTIVERT) 25 MG tablet; Take 1 tablet (25 mg total) by mouth 3 (three) times daily as needed for dizziness.  Mixed hyperlipidemia  Vitamin D insufficiency -     Vitamin D 1,25 dihydroxy  Type  1 diabetes mellitus with diabetic nephropathy (HCC) -     COMPLETE METABOLIC PANEL WITH GFR  No energy -     Vitamin D 1,25 dihydroxy -     TSH -     CBC -     Ferritin -     Vitamin B12  Preventative health care -     Lipid Panel w/reflex Direct LDL -     COMPLETE METABOLIC PANEL WITH GFR -     Vitamin D 1,25 dihydroxy -     TSH -     CBC -     Ferritin -     Vitamin B12  spirometry done today shows a restrictive pattern with FEV1 57 percent and ratio of 79 percent with no change post bronchiodilator. Restrictive could be from body habitus. She has dx of COPD and on advair since 2005. I will send to  pulmonology to tease out if she should be on advair. She reports not much benefit with rescue inhaler. Former smoker.   SOB on exertion. DVT/PE is low but she does have risk factors. She just got out of her immobilization brace 2 weeks ago. SOB over 1 month. She is not tachycardic and pulse ox is 97 percent and does not desat with exertion. Muscle atrophy occurred after cam been on for 6 months so cannot assess calf size. D-dimer was positive therefor ordered a CTA.   Pt continues to struggle with BPPV off and on. antivert refilled. Continue flonase. Will see if home PT will come out and do some epley manuvers.   Marland Kitchen.Spent 30 minutes with patient and greater than 50 percent of visit spent counseling patient regarding treatment plan.

## 2018-04-11 NOTE — Patient Instructions (Signed)
Will make pulmonary referral.  Will get d-dimer

## 2018-04-12 ENCOUNTER — Ambulatory Visit (HOSPITAL_BASED_OUTPATIENT_CLINIC_OR_DEPARTMENT_OTHER)
Admission: RE | Admit: 2018-04-12 | Discharge: 2018-04-12 | Disposition: A | Discharge status: Home / Self Care | Payer: Medicare HMO | Source: Ambulatory Visit | Attending: Physician Assistant | Admitting: Physician Assistant

## 2018-04-12 ENCOUNTER — Encounter (HOSPITAL_BASED_OUTPATIENT_CLINIC_OR_DEPARTMENT_OTHER): Payer: Self-pay

## 2018-04-12 DIAGNOSIS — E782 Mixed hyperlipidemia: Secondary | ICD-10-CM | POA: Insufficient documentation

## 2018-04-12 DIAGNOSIS — I700 Atherosclerosis of aorta: Secondary | ICD-10-CM | POA: Insufficient documentation

## 2018-04-12 DIAGNOSIS — R0602 Shortness of breath: Secondary | ICD-10-CM | POA: Diagnosis present

## 2018-04-12 DIAGNOSIS — R7989 Other specified abnormal findings of blood chemistry: Secondary | ICD-10-CM | POA: Diagnosis not present

## 2018-04-12 DIAGNOSIS — J984 Other disorders of lung: Secondary | ICD-10-CM | POA: Insufficient documentation

## 2018-04-12 DIAGNOSIS — E559 Vitamin D deficiency, unspecified: Secondary | ICD-10-CM | POA: Insufficient documentation

## 2018-04-12 MED ORDER — MECLIZINE HCL 25 MG PO TABS: 25.0000 mg | ORAL_TABLET | Freq: Three times a day (TID) | ORAL | 0 refills | Status: DC | PRN

## 2018-04-12 MED ORDER — IOPAMIDOL (ISOVUE-370) INJECTION 76%
100.0000 mL | Freq: Once | INTRAVENOUS | Status: AC | PRN
  Administered 2018-04-12: 100 mL via INTRAVENOUS

## 2018-04-12 NOTE — Progress Notes (Signed)
Call pt: great news no blood clots.

## 2018-04-12 NOTE — Progress Notes (Signed)
Call pt: Elevated d dimer she need CTA to rule out PE for SOB. I placed order already. SOB ongoing for 1 month. I did not place order stat.

## 2018-04-14 ENCOUNTER — Other Ambulatory Visit: Payer: Self-pay | Admitting: Physician Assistant

## 2018-04-14 DIAGNOSIS — Z794 Long term (current) use of insulin: Secondary | ICD-10-CM

## 2018-04-14 DIAGNOSIS — E118 Type 2 diabetes mellitus with unspecified complications: Secondary | ICD-10-CM

## 2018-04-14 DIAGNOSIS — I10 Essential (primary) hypertension: Secondary | ICD-10-CM

## 2018-04-14 LAB — VITAMIN D 1,25 DIHYDROXY
VITAMIN D 1, 25 (OH) TOTAL: 26 pg/mL (ref 18–72)
Vitamin D2 1, 25 (OH)2: <8 pg/mL
Vitamin D3 1, 25 (OH)2: 26 pg/mL

## 2018-04-14 LAB — COMPLETE METABOLIC PANEL WITH GFR
AG RATIO: 1.2 (calc) (ref 1.0–2.5)
ALT: 8 U/L (ref 6–29)
AST: 10 U/L (ref 10–35)
Albumin: 3.7 g/dL (ref 3.6–5.1)
Alkaline phosphatase (APISO): 74 U/L (ref 33–130)
BILIRUBIN TOTAL: 0.4 mg/dL (ref 0.2–1.2)
BUN: 18 mg/dL (ref 7–25)
CALCIUM: 9.1 mg/dL (ref 8.6–10.4)
CHLORIDE: 105 mmol/L (ref 98–110)
CO2: 26 mmol/L (ref 20–32)
Creat: 0.78 mg/dL (ref 0.50–0.99)
GFR, EST NON AFRICAN AMERICAN: 80 mL/min/{1.73_m2} (ref >=60)
GFR, Est African American: 93 mL/min/{1.73_m2} (ref >=60)
GLOBULIN: 3 g/dL (ref 1.9–3.7)
Glucose, Bld: 109 mg/dL — ABNORMAL HIGH (ref 65–99)
POTASSIUM: 4.4 mmol/L (ref 3.5–5.3)
SODIUM: 139 mmol/L (ref 135–146)
Total Protein: 6.7 g/dL (ref 6.1–8.1)

## 2018-04-14 LAB — CBC
HEMATOCRIT: 34.4 % — AB (ref 35.0–45.0)
Hemoglobin: 11.1 g/dL — ABNORMAL LOW (ref 11.7–15.5)
MCH: 27.3 pg (ref 27.0–33.0)
MCHC: 32.3 g/dL (ref 32.0–36.0)
MCV: 84.5 fL (ref 80.0–100.0)
MPV: 9.6 fL (ref 7.5–12.5)
PLATELETS: 227 10*3/uL (ref 140–400)
RBC: 4.07 10*6/uL (ref 3.80–5.10)
RDW: 14.5 % (ref 11.0–15.0)
WBC: 5.9 10*3/uL (ref 3.8–10.8)

## 2018-04-14 LAB — TSH: TSH: 1.98 mIU/L (ref 0.40–4.50)

## 2018-04-14 LAB — FERRITIN: Ferritin: 27 ng/mL (ref 16–288)

## 2018-04-14 LAB — LIPID PANEL W/REFLEX DIRECT LDL
CHOLESTEROL: 142 mg/dL (ref <200)
HDL: 49 mg/dL — AB (ref >50)
LDL CHOLESTEROL (CALC): 73 mg/dL
NON-HDL CHOLESTEROL (CALC): 93 mg/dL (ref <130)
TRIGLYCERIDES: 122 mg/dL (ref <150)
Total CHOL/HDL Ratio: 2.9 (calc) (ref <5.0)

## 2018-04-14 LAB — VITAMIN B12: VITAMIN B 12: 277 pg/mL (ref 200–1100)

## 2018-04-14 LAB — D-DIMER, QUANTITATIVE: D-Dimer, Quant: 0.83 mcg/mL FEU — ABNORMAL HIGH (ref <0.50)

## 2018-04-17 NOTE — Progress Notes (Signed)
Call pt: b12, vitamin D iron all a little low. I would like for you to start b12 1081mcg, D3 2000 units daily, and ferrous sulfate 325mg  and see if all 3 together could improve your energy level. Recheck in 3 months.

## 2018-04-18 ENCOUNTER — Other Ambulatory Visit: Payer: Self-pay | Admitting: Physician Assistant

## 2018-04-18 DIAGNOSIS — G894 Chronic pain syndrome: Secondary | ICD-10-CM

## 2018-04-18 DIAGNOSIS — M542 Cervicalgia: Secondary | ICD-10-CM

## 2018-04-18 DIAGNOSIS — M545 Low back pain: Secondary | ICD-10-CM

## 2018-04-18 MED ORDER — HYDROCODONE-ACETAMINOPHEN 5-325 MG PO TABS: ORAL_TABLET | ORAL | 0 refills | Status: DC

## 2018-04-18 NOTE — Progress Notes (Signed)
I placed referral on 04/12/18 for pulmonologist. They should be calling soon.  My electronic system is down. I printed rx for patient to pick up for hydrocodone.

## 2018-04-23 ENCOUNTER — Telehealth: Payer: Self-pay

## 2018-04-23 NOTE — Telephone Encounter (Signed)
No not been fixed.

## 2018-04-23 NOTE — Telephone Encounter (Signed)
Patient advised.

## 2018-04-23 NOTE — Telephone Encounter (Signed)
Anjalina called to see if [name] Rice can e-prescribe the Hydrocodone.

## 2018-04-26 DIAGNOSIS — R69 Illness, unspecified: Secondary | ICD-10-CM | POA: Diagnosis not present

## 2018-05-07 ENCOUNTER — Ambulatory Visit (INDEPENDENT_AMBULATORY_CARE_PROVIDER_SITE_OTHER): Payer: Medicare HMO | Admitting: Family Medicine

## 2018-05-07 ENCOUNTER — Ambulatory Visit (INDEPENDENT_AMBULATORY_CARE_PROVIDER_SITE_OTHER): Payer: Medicare HMO

## 2018-05-07 VITALS — BP 150/80 | HR 89 | Temp 98.8°F | Wt 270.0 lb

## 2018-05-07 DIAGNOSIS — J0100 Acute maxillary sinusitis, unspecified: Secondary | ICD-10-CM | POA: Diagnosis not present

## 2018-05-07 DIAGNOSIS — M25571 Pain in right ankle and joints of right foot: Secondary | ICD-10-CM | POA: Diagnosis not present

## 2018-05-07 DIAGNOSIS — G8929 Other chronic pain: Secondary | ICD-10-CM | POA: Diagnosis not present

## 2018-05-07 MED ORDER — PREDNISONE 10 MG PO TABS: 30.0000 mg | ORAL_TABLET | Freq: Every day | ORAL | 0 refills | Status: DC

## 2018-05-07 MED ORDER — FLUTICASONE PROPIONATE 50 MCG/ACT NA SUSP: 2.0000 | Freq: Every day | NASAL | 2 refills | Status: DC

## 2018-05-07 NOTE — Progress Notes (Signed)
[name] Rice is a 62 y.o. female who presents to [facility]: [name] Rice today for nasal congestion and ankle pain.   Nasal congestion: [name] Rice notes a one-month history of nasal congestion and a several week history of sore throat.  She is tried over-the-counter medications as well as Astelin nasal spray.  She notes is quite obnoxious and she is somewhat worsening.  No fevers or chills vomiting or diarrhea.  No chest pain or palpitations.  Additionally she notes right ankle pain.  She has a history of Lisfranc's injury to her right foot ultimately ending in surgery earlier this year.  She did pretty well and notes that her foot pain has improved.  She does note however pain in the lateral ankle.  She notes the pain goes along the posterior aspect of the lateral malleolus and is worse with ambulation.  She notes that it does limit her ability to walk.  She denies any radiating pain fevers or chills or recent injury.   ROS as above:  Exam:  BP (!) 150/80   Pulse 89   Temp 98.8 F (37.1 C) (Oral)   Wt 270 lb (122.5 kg)   LMP 10/18/1995   BMI 47.83 kg/m  Gen: Well NAD HEENT: EOMI,  MMM inflamed nasal turbinates bilaterally with clear discharge.  Tender palpation maxillary sinus.  Normal tympanic membranes.  No significant cervical lymphadenopathy. Lungs: Normal work of breathing. CTABL Heart: RRR no MRG Abd: NABS, Soft. Nondistended, Nontender Exts: Brisk capillary refill, warm and well perfused.  Right foot and ankle slightly swollen at the lateral malleolus.  Well-appearing scar over the dorsal midfoot. Dorsal midfoot is nontender. Mildly tender palpation posterior lateral malleolus. Stable ligamentous exam.  Pulses capillary fill and sensation are intact distally.  Lab and Radiology Results X-ray ankle right images personally independently reviewed Well-appearing surgical  hardware dorsal midfoot.  No acute fracture at the ankle joint.  No significant disassociation or widening of the mortise joint. impression: No acute fracture. Awaiting formal radiology review  Assessment and Plan: 62 y.o. female with  Nasal congestion and sore throat.  Likely sinusitis or sinus congestion.  Failing typical conservative management.  Trial of Flonase nasal spray and oral steroids.  Recheck in 1 month or sooner if needed.  Ankle pain: Likely peroneal tendinopathy.  Plan for home exercise program and compressive ankle sleeve.  Consider home health physical therapy.  Recheck in 1 month.  X-ray formal radiology review pending.   Orders Placed This Encounter  Procedures  . DG Ankle Complete Right    Standing Status:   Future    Number of Occurrences:   1    Standing Expiration Date:   07/09/2019    Order Specific Question:   Reason for Exam (SYMPTOM  OR DIAGNOSIS REQUIRED)    Answer:   eval pain and swelling right lateral ankle    Order Specific Question:   Preferred imaging location?    Answer:   [facility]    Order Specific Question:   Radiology Contrast Protocol - do NOT remove file path    Answer:   [email or web address]   Meds ordered this encounter  Medications  . fluticasone (FLONASE) 50 MCG/ACT nasal spray    Sig: Place 2 sprays into both nostrils daily.    Dispense:  16 g    Refill:  2  . predniSONE (DELTASONE) 10 MG tablet    Sig: Take 3 tablets (30 mg total) by  mouth daily with breakfast.    Dispense:  15 tablet    Refill:  0     Historical information moved to improve visibility of documentation.  Past Medical History:  Diagnosis Date  . Anxiety   . Arthritis   . Asthma    no PFTs  . Cervical dystonia   . COPD (chronic obstructive pulmonary disease) (HCC)    no PFTs  . Depression   . Diabetes mellitus   . Diarrhea   . Diverticulitis   . Diverticulosis   . Dizziness   . Gastroparesis   . GERD  (gastroesophageal reflux disease)   . History of colonic polyps    hyperplastic  . History of kidney stones   . History of seborrheic dermatitis    face,ears  . HLD (hyperlipidemia)   . Hypertension   . Intussusception ([place])   . Lisfranc's sprain, left, initial encounter 10/06/2017  . Lumbar pain 2011   per x-ray - Multilevel spondylosis  . Morbid obesity ([place])   . Nephrolithiasis   . OSA on CPAP 2008  . Pneumonia   . Recurrent boils    "SLUSTER BOILS" ON ABD AND LABIA  . Sleep apnea    USES C-PAP  . Torn rotator cuff RT SHOULDER  . Tremor CCENTRAL NERVOUS SYSTEM   Past Surgical History:  Procedure Laterality Date  . CARPAL TUNNEL RELEASE Left 2006  . ELBOW SURGERY Left   . HAND SURGERY Right RIGHT  . JOINT REPLACEMENT Right    shoulder  . KNEE ARTHROSCOPY Left   . LUMBAR LAMINECTOMY    . MASS EXCISION  06/28/2012   Procedure: EXCISION MASS;  Surgeon: Harl Bowie, MD;  Location: [facility];  Service: General;  Laterality: Left;  excision left upper quadrant abdominal wall mass, Benign Lipoma  . NASAL SINUS SURGERY    . PILONIDAL CYST EXCISION  [id number]  . TUBAL LIGATION     Social History   Tobacco Use  . Smoking status: Former Smoker    Types: Cigarettes    Last attempt to quit: 10/17/1996    Years since quitting: 21.5  . Smokeless tobacco: Never Used  Substance Use Topics  . Alcohol use: No   family history includes Depression in her brother, brother, and sister; Diabetes in her brother, brother, mother, and sister; Heart attack in her brother and brother; Heart disease in her brother; Hyperlipidemia in her brother, brother, mother, and sister; Hypertension in her brother, brother, mother, and sister; Kidney cancer in her brother; Kidney disease in her mother; Stroke in her sister.  Medications: Current Outpatient Medications  Medication Sig Dispense Refill  . acetic acid-hydrocortisone (VOSOL-HC) OTIC solution Place 5 drops into both ears 2 (two) times daily.  10 mL 0  . AMBULATORY NON FORMULARY MEDICATION 4 prong cane DX R06.89 1 each 0  . AMBULATORY NON FORMULARY MEDICATION Diabetic shoes  Dx: Diabetes, uncontrolled 250.60 with neuropathy. 1 Device 0  . AMBULATORY NON FORMULARY MEDICATION Test strips and needles for one touch Verio IQ glucometer.  Use to test blood sugar up to four times a day. Include all supplies needed including cartridge for lancets.  Diagnosis Type 2 Diabetes 100 each 3  . azelastine (ASTELIN) 0.1 % nasal spray Place 2 sprays into both nostrils 2 (two) times daily. Use in each nostril as directed 30 mL 6  . buPROPion (WELLBUTRIN XL) 150 MG 24 hr tablet TAKE 1 TABLET(150 MG) BY MOUTH DAILY 90 tablet 2  . Capsaicin 0.033 % CREA  Apply 1 inch topically 4 (four) times daily. 56.6 g 12  . DULoxetine (CYMBALTA) 60 MG capsule Take 2 capsules (120 mg total) by mouth daily. 60 capsule 5  . Fluticasone-Salmeterol (ADVAIR DISKUS) 250-50 MCG/DOSE AEPB inhale 1 puff by mouth twice a day 60 each 11  . gabapentin (NEURONTIN) 800 MG tablet Take 1 tablet (800 mg total) by mouth 3 (three) times daily. 90 tablet 5  . HYDROcodone-acetaminophen (NORCO/VICODIN) 5-325 MG tablet TAKE 1 TO 2 TABLETS BY MOUTH three times DAILY as needed 90 tablet 0  . hydrOXYzine (ATARAX/VISTARIL) 50 MG tablet Take 1 tablet (50 mg total) by mouth every 8 (eight) hours as needed. For itching. (Patient taking differently: Take 25 mg by mouth every 8 (eight) hours as needed. For itching.) 90 tablet 2  . Insulin Pen Needle (NOVOFINE) 32G X 6 MM MISC USE 3 TIMES A DAY WITH NOVOLOG 100 each 2  . liraglutide (VICTOZA) 18 MG/3ML SOPN inject 1.8 milligram subcutaneously daily 6 mL 2  . Loratadine (CLARITIN) 10 MG CAPS Take 1 capsule (10 mg total) by mouth daily. 30 each 11  . LORazepam (ATIVAN) 0.5 MG tablet Take 1 tablet (0.5 mg total) by mouth every 8 (eight) hours as needed for anxiety. 20 tablet 0  . meclizine (ANTIVERT) 25 MG tablet Take 1 tablet (25 mg total) by mouth 3  (three) times daily as needed for dizziness. 30 tablet 0  . meloxicam (MOBIC) 15 MG tablet Take 1 tablet (15 mg total) by mouth as needed for pain. 30 tablet 2  . metFORMIN (GLUCOPHAGE) 1000 MG tablet Take 1 tablet (1,000 mg total) by mouth 2 (two) times daily with a meal. 60 tablet 5  . nystatin (NYSTATIN) powder APPLY EXTERNALLY TO THE AFFECTED AREA THREE TIMES DAILY AS DIRECTED 60 g prn  . nystatin ointment (MYCOSTATIN) apply to affected area twice a day 60 g 1  . olmesartan (BENICAR) 20 MG tablet TAKE 1 TABLET(20 MG) BY MOUTH DAILY 30 tablet 1  . pioglitazone (ACTOS) 30 MG tablet Take 1 tablet (30 mg total) by mouth daily. 90 tablet 1  . propranolol (INDERAL) 20 MG tablet TAKE 1 TABLET(20 MG) BY MOUTH TWICE DAILY 180 tablet 0  . fluticasone (FLONASE) 50 MCG/ACT nasal spray Place 2 sprays into both nostrils daily. 16 g 2  . predniSONE (DELTASONE) 10 MG tablet Take 3 tablets (30 mg total) by mouth daily with breakfast. 15 tablet 0   No current facility-administered medications for this visit.    Allergies  Allergen Reactions  . Ace Inhibitors Other (See Comments)    Causes blood pressure to drop rapidly  . Atrovent [Ipratropium] Swelling    Throat swelling on Atorvent Nasal Spray  . Cefaclor Hives and Itching  . Shrimp [Shellfish Allergy] Nausea And Vomiting  . Ceclor [Cefaclor]   . Glyxambi [Empagliflozin-Linagliptin]     Itching/rash  . Sulfa Antibiotics   . Sulfonamide Derivatives Rash     Discussed warning signs or symptoms. Please see discharge instructions. Patient expresses understanding.

## 2018-05-07 NOTE — Patient Instructions (Addendum)
Thank you for coming in today. Get xray now.  Use body helix full ankle.  Start home exercises.  Use an ace wrap if you cannot get the sleeve.   For nose continue current medicine.  Add Flonase nasal spray.    Take prednisone for 5 days.   Recheck in 1 month.  Return sooner if needed.    Postnasal Drip Postnasal drip is the feeling of mucus going down the back of your throat. Mucus is a slimy substance that moistens and cleans your nose and throat, as well as the air pockets in face bones near your forehead and cheeks (sinuses). Small amounts of mucus pass from your nose and sinuses down the back of your throat all the time. This is normal. When you produce too much mucus or the mucus gets too thick, you can feel it. Some common causes of postnasal drip include:  Having more mucus because of: ? A cold or the flu. ? Allergies. ? Cold air. ? Certain medicines.  Having more mucus that is thicker because of: ? A sinus or nasal infection. ? Dry air. ? A food allergy.  Follow these instructions at home: Relieving discomfort  Gargle with a salt-water mixture 3-4 times a day or as needed. To make a salt-water mixture, completely dissolve -1 tsp of salt in 1 cup of warm water.  If the air in your home is dry, use a humidifier to add moisture to the air.  Use a saline spray or container (neti pot) to flush out the nose (nasal irrigation). These methods can help clear away mucus and keep the nasal passages moist. General instructions  Take over-the-counter and prescription medicines only as told by your health care provider.  Follow instructions from your health care provider about eating or drinking restrictions. You may need to avoid caffeine.  Avoid things that you know you are allergic to (allergens), like dust, mold, pollen, pets, or certain foods.  Drink enough fluid to keep your urine pale yellow.  Keep all follow-up visits as told by your health care provider. This is  important. Contact a health care provider if:  You have a fever.  You have a sore throat.  You have difficulty swallowing.  You have headache.  You have sinus pain.  You have a cough that does not go away.  The mucus from your nose becomes thick and is green or yellow in color.  You have cold or flu symptoms that last more than 10 days. Summary  Postnasal drip is the feeling of mucus going down the back of your throat.  If your health care provider approves, use nasal irrigation or a nasal spray 2?4 times a day.  Avoid things that you know you are allergic to (allergens), like dust, mold, pollen, pets, or certain foods. This information is not intended to replace advice given to you by your health care provider. Make sure you discuss any questions you have with your health care provider. Document Released: 01/16/2017 Document Revised: 01/16/2017 Document Reviewed: 01/16/2017 Elsevier Interactive Patient Education  [employer].

## 2018-05-09 ENCOUNTER — Telehealth: Payer: Self-pay

## 2018-05-09 NOTE — Telephone Encounter (Signed)
Girlie would like a Rx Iron instead of an OTC medication.

## 2018-05-09 NOTE — Telephone Encounter (Signed)
Derek reports severe ankle pain. She would like to know the next step. Please advise.

## 2018-05-10 NOTE — Telephone Encounter (Signed)
Called patient and informed of MD recommendations. Patient verbalized understanding of plan of care. KG LPN

## 2018-05-10 NOTE — Telephone Encounter (Signed)
Transition back to cam walker boot.  Give it a rest for about a week.  Continue home exercises.  If not improving return to clinic and we will proceed with injection potentially.

## 2018-05-14 ENCOUNTER — Other Ambulatory Visit: Payer: Self-pay | Admitting: Physician Assistant

## 2018-05-14 MED ORDER — FERROUS SULFATE 325 (65 FE) MG PO TBEC: 325.0000 mg | DELAYED_RELEASE_TABLET | Freq: Every day | ORAL | 4 refills | Status: DC

## 2018-05-15 DIAGNOSIS — G4733 Obstructive sleep apnea (adult) (pediatric): Secondary | ICD-10-CM | POA: Diagnosis not present

## 2018-05-18 ENCOUNTER — Ambulatory Visit (INDEPENDENT_AMBULATORY_CARE_PROVIDER_SITE_OTHER): Payer: Medicare HMO | Admitting: Physician Assistant

## 2018-05-18 ENCOUNTER — Encounter: Payer: Self-pay | Admitting: Physician Assistant

## 2018-05-18 VITALS — BP 138/83 | HR 70 | Ht 63.0 in | Wt 283.0 lb

## 2018-05-18 DIAGNOSIS — G4733 Obstructive sleep apnea (adult) (pediatric): Secondary | ICD-10-CM | POA: Diagnosis not present

## 2018-05-18 DIAGNOSIS — E1142 Type 2 diabetes mellitus with diabetic polyneuropathy: Secondary | ICD-10-CM

## 2018-05-18 DIAGNOSIS — I10 Essential (primary) hypertension: Secondary | ICD-10-CM | POA: Diagnosis not present

## 2018-05-18 DIAGNOSIS — J984 Other disorders of lung: Secondary | ICD-10-CM

## 2018-05-18 DIAGNOSIS — Z9989 Dependence on other enabling machines and devices: Secondary | ICD-10-CM

## 2018-05-18 DIAGNOSIS — K5903 Drug induced constipation: Secondary | ICD-10-CM | POA: Diagnosis not present

## 2018-05-18 MED ORDER — DAPAGLIFLOZIN PROPANEDIOL 10 MG PO TABS: 10.0000 mg | ORAL_TABLET | Freq: Every day | ORAL | 2 refills | Status: DC

## 2018-05-18 MED ORDER — LUBIPROSTONE 24 MCG PO CAPS: 24.0000 ug | ORAL_CAPSULE | Freq: Two times a day (BID) | ORAL | 2 refills | Status: DC

## 2018-05-18 NOTE — Patient Instructions (Addendum)
Start amitiza.  Start faraxiga.

## 2018-05-18 NOTE — Progress Notes (Signed)
Subjective:    Patient ID: [name] Rice, female    DOB: 10-13-1956, 62 y.o.   MRN: [id number]  HPI  Pt is a 62 yo morbidly obese female with HTN, hx of COPD, OSA, T2DM who presents to the clinic for 3 month follow up.   DM- she stopped metformin due to diarrhea. Now she has constipation. She is taking the rest of her medication. Very rarely if sugars get really high does she take insulin. For constipation she is only taking pepto bismol. No open sores or wounds. No hypoglycemic events.   She still has not been called by pulmonology. She continues to have SOB with any exertion.   Denies any CP, palpitations, headaches, or vision changes.   .. Active Ambulatory Problems    Diagnosis Date Noted  . Type II or unspecified type diabetes mellitus with neurological manifestations, not stated as uncontrolled(250.60) 08/08/2006  . DYSLIPIDEMIA 08/08/2006  . Depression with anxiety 01/28/2008  . Essential hypertension 08/08/2006  . COPD 08/08/2006  . GERD 08/08/2006  . Acute bilateral low back pain 11/27/2007  . BPPV (benign paroxysmal positional vertigo), unspecified laterality 07/14/2009  . OSA on CPAP   . Insomnia 02/06/2011  . Preventative health care 08/16/2011  . History of allergic rhinitis 09/07/2011  . Iron deficiency anemia 03/29/2012  . Lipoma of abdominal wall 05/01/2012  . Intestinal bacterial overgrowth 09/20/2012  . SOB (shortness of breath) on exertion 03/31/2013  . Hepatic steatosis 09/26/2013  . Diverticulitis 11/27/2013  . Diabetic peripheral neuropathy ([place]) 05/30/2014  . Gastroparesis due to secondary diabetes ([place]) 09/16/2014  . Ear pain 12/09/2014  . COPD (chronic obstructive pulmonary disease) with chronic bronchitis ([place]) 12/09/2014  . Type 2 diabetes, controlled, with peripheral neuropathy ([place]) 12/09/2014  . Diarrhea 01/12/2015  . Non-proliferative diabetic retinopathy, right eye ([place]) 11/23/2015  . Chronic pain syndrome 12/15/2015  . Morbid obesity due to  excess calories ([place]) 02/16/2016  . Right shoulder pain 02/16/2016  . Unstable balance 02/16/2016  . Intertrigo 03/15/2016  . Left knee pain 03/15/2016  . Post-menopausal 03/16/2016  . Acute reaction to stress 03/28/2016  . Dystonia 09/21/2016  . Neck pain 07/11/2017  . Lisfranc's dislocation, right, initial encounter 10/06/2017  . Freiberg's disease, right 10/06/2017  . Vasomotor rhinitis 11/28/2017  . Ear itching 01/17/2018  . No energy 01/17/2018  . Hordeolum externum of right lower eyelid 02/16/2018  . Hot flashes 02/16/2018  . Restrictive lung disease 04/12/2018  . Mixed hyperlipidemia 04/12/2018  . Vitamin D insufficiency 04/12/2018  . Drug-induced constipation 05/23/2018   Resolved Ambulatory Problems    Diagnosis Date Noted  . IRON DEFICIENCY 01/30/2007  . DEPRESSION 08/08/2006  . TORTICOLLIS, SPASMODIC 02/13/2007  . SINUSITIS, ACUTE 05/27/2009  . ALLERGIC RHINITIS 12/25/2006  . ASTHMA 01/28/2008  . DIVERTICULOSIS, COLON 02/12/2003  . DERMATITIS, SEBORRHEIC 01/30/2007  . GOUT, HX OF 08/08/2006  . PERSONAL HX COLONIC POLYPS 09/16/2008  . NEPHROLITHIASIS, HX OF 01/28/2008  . Calf pain 11/29/2010  . Tremor, essential 02/06/2011  . Seborrheic dermatitis, unspecified 04/06/2011  . Finger pain 04/06/2011  . Shoulder pain, right 08/16/2011  . Left leg pain 04/10/2012  . Right ear pain 05/01/2012  . Seborrheic dermatitis 05/01/2012  . Hypoglycemia 05/01/2012  . Vaginal candidiasis 08/22/2012  . Sigmoid diverticulitis 09/15/2012  . Acute-on-chronic kidney disease Stage II 09/15/2012  . Cyst, breast 03/01/2013  . Lactic acidosis 03/31/2013  . Acute renal insufficiency 03/31/2013  . Abdominal pain, left lower quadrant 04/29/2013  . Gout 05/01/2013  .  Fall 03/16/2016  . Foot fracture, right, closed, initial encounter 08/23/2017   Past Medical History:  Diagnosis Date  . Anxiety   . Arthritis   . Asthma   . Cervical dystonia   . COPD (chronic obstructive  pulmonary disease) ([place])   . Depression   . Diabetes mellitus   . Diarrhea   . Diverticulitis   . Diverticulosis   . Dizziness   . Gastroparesis   . GERD (gastroesophageal reflux disease)   . History of colonic polyps   . History of kidney stones   . History of seborrheic dermatitis   . HLD (hyperlipidemia)   . Hypertension   . Intussusception ([place])   . Lisfranc's sprain, left, initial encounter 10/06/2017  . Lumbar pain 2011  . Morbid obesity ([place])   . Nephrolithiasis   . OSA on CPAP 2008  . Pneumonia   . Recurrent boils   . Sleep apnea   . Torn rotator cuff RT SHOULDER  . Tremor [place]      Review of Systems  All other systems reviewed and are negative.      Objective:   Physical Exam  Constitutional: She is oriented to person, place, and time. She appears well-developed and well-nourished.  Obese.   HENT:  Head: Normocephalic and atraumatic.  Cardiovascular: Normal rate and regular rhythm.  Pulmonary/Chest: Effort normal and breath sounds normal.  Neurological: She is alert and oriented to person, place, and time.  Psychiatric: She has a normal mood and affect. Her behavior is normal.          Assessment & Plan:  Marland KitchenMarland KitchenDiagnoses and all orders for this visit:  Restrictive lung disease  Essential hypertension  Type 2 diabetes, controlled, with peripheral neuropathy (HCC) -     dapagliflozin propanediol (FARXIGA) 10 MG TABS tablet; Take 10 mg by mouth daily.  Drug-induced constipation -     lubiprostone (AMITIZA) 24 MCG capsule; Take 1 capsule (24 mcg total) by mouth 2 (two) times daily with a meal.  OSA on CPAP   .Marland Kitchen Depression screen [facility] 2/9 03/21/2018 10/20/2017 12/20/2016 04/29/2013 04/08/2013  Decreased Interest 1 2 3  0 0  Down, Depressed, Hopeless 2 3 3  0 -  PHQ - 2 Score 3 5 6  0 0  Altered sleeping 1 3 3  - -  Tired, decreased energy 3 3 3  - -  Change in appetite 1 3 3  - -  Feeling bad or failure about yourself  1 3 1  - -  Trouble  concentrating 1 3 3  - -  Moving slowly or fidgety/restless 0 2 1 - -  Suicidal thoughts 0 1 0 - -  PHQ-9 Score 10 23 20  - -  Some recent data might be hidden   A!C 7.3 Up some from last check. Added faraxiga for CV risk reduction and sugar reduction. Discussed diet/exercise/sugars and carbs. Follow up in 3 months.   For constipation discussed miralax and increasing fluids. Likely due to being on norco some medication induced constipation. Start amitiza. Discussed side effects. Follow up as needed. Follow up in 3 months.   Reprinted referral to pulmonology. and will let coordinator known. s spirometry done in June 2019 shows a restrictive pattern with FEV1 57 percent and ratio of 79 percent with no change post bronchiodilator. Restrictive could be from body habitus. She has dx of COPD and on advair since 2005. I will send to pulmonology to tease out if she should be on advair. She reports not much benefit  with rescue inhaler. Former smoker.

## 2018-05-23 DIAGNOSIS — K5903 Drug induced constipation: Secondary | ICD-10-CM | POA: Insufficient documentation

## 2018-05-23 NOTE — Telephone Encounter (Signed)
a1c not added 6.7

## 2018-05-24 ENCOUNTER — Other Ambulatory Visit: Payer: Self-pay | Admitting: Physician Assistant

## 2018-05-24 DIAGNOSIS — I10 Essential (primary) hypertension: Secondary | ICD-10-CM

## 2018-05-24 DIAGNOSIS — Z794 Long term (current) use of insulin: Secondary | ICD-10-CM

## 2018-05-24 DIAGNOSIS — E118 Type 2 diabetes mellitus with unspecified complications: Secondary | ICD-10-CM

## 2018-05-25 ENCOUNTER — Telehealth: Payer: Self-pay

## 2018-05-25 DIAGNOSIS — M542 Cervicalgia: Secondary | ICD-10-CM

## 2018-05-25 DIAGNOSIS — G894 Chronic pain syndrome: Secondary | ICD-10-CM

## 2018-05-25 DIAGNOSIS — M545 Low back pain: Secondary | ICD-10-CM

## 2018-05-25 MED ORDER — HYDROCODONE-ACETAMINOPHEN 5-325 MG PO TABS: ORAL_TABLET | ORAL | 0 refills | Status: DC

## 2018-05-25 NOTE — Telephone Encounter (Signed)
rx sent

## 2018-05-25 NOTE — Telephone Encounter (Signed)
Pt called requesting RF on Norco  Last RX written  04-18-18 for 1-2 tabs TID PRN   Please review and send if appropriate   Thanks!   PCP out of office- sending to covering provider

## 2018-05-28 ENCOUNTER — Encounter: Payer: Self-pay | Admitting: Emergency Medicine

## 2018-05-28 ENCOUNTER — Emergency Department (INDEPENDENT_AMBULATORY_CARE_PROVIDER_SITE_OTHER)
Admission: EM | Admit: 2018-05-28 | Discharge: 2018-05-28 | Disposition: A | Discharge status: Home / Self Care | Payer: Medicare HMO | Source: Home / Self Care | Attending: Family Medicine | Admitting: Family Medicine

## 2018-05-28 DIAGNOSIS — B372 Candidiasis of skin and nail: Secondary | ICD-10-CM

## 2018-05-28 DIAGNOSIS — Z008 Encounter for other general examination: Secondary | ICD-10-CM | POA: Diagnosis not present

## 2018-05-28 MED ORDER — PREDNISONE 20 MG PO TABS: ORAL_TABLET | ORAL | 0 refills | Status: DC

## 2018-05-28 MED ORDER — FLUCONAZOLE 150 MG PO TABS: ORAL_TABLET | ORAL | 0 refills | Status: DC

## 2018-05-28 MED ORDER — DOXYCYCLINE HYCLATE 100 MG PO CAPS: 100.0000 mg | ORAL_CAPSULE | Freq: Two times a day (BID) | ORAL | 0 refills | Status: DC

## 2018-05-28 MED ORDER — ALUM SULFATE-CA ACETATE EX PACK: PACK | CUTANEOUS | 12 refills | Status: DC

## 2018-05-28 NOTE — ED Triage Notes (Signed)
Pt c/o rash on abdomen x1 week. States she gets frequenct yeast inf on skin and feels that's what this is.

## 2018-05-28 NOTE — Discharge Instructions (Addendum)
Keep area clean and dry. °

## 2018-05-28 NOTE — ED Provider Notes (Signed)
[name]    CSN: [id number] Arrival date & time: 05/28/18  1601     History   Chief Complaint Chief Complaint  Patient presents with  . Rash    HPI [name] Rice is a 62 y.o. female.   Patient states that she has chronic intertrigo on her lower abdomen, usually controlled with Nystatin cream.  During the past 9 to 10 days she has had increased redness, pain, and weeping in the intertriginous area of her right lower abdomen.  No fevers, chills, and sweats.  She denies recent antibiotic use.  The history is provided by the patient and a relative.  Rash  Location: right abdomen. Quality: burning, painful, redness and weeping   Quality: not blistering and not swelling   Pain details:    Quality:  Burning   Severity:  Moderate   Onset quality:  Gradual   Duration:  10 days   Timing:  Constant   Progression:  Worsening Severity:  Moderate Onset quality:  Gradual Duration:  10 days Timing:  Constant Progression:  Worsening Chronicity:  Chronic Context: not animal contact, not chemical exposure, not hot tub use and not new detergent/soap   Relieved by:  Nothing Worsened by:  Contact Ineffective treatments: nystatin. Associated symptoms: no abdominal pain, no fatigue, no fever, no induration, no joint pain, no myalgias and no nausea     Past Medical History:  Diagnosis Date  . Anxiety   . Arthritis   . Asthma    no PFTs  . Cervical dystonia   . COPD (chronic obstructive pulmonary disease) (HCC)    no PFTs  . Depression   . Diabetes mellitus   . Diarrhea   . Diverticulitis   . Diverticulosis   . Dizziness   . Gastroparesis   . GERD (gastroesophageal reflux disease)   . History of colonic polyps    hyperplastic  . History of kidney stones   . History of seborrheic dermatitis    face,ears  . HLD (hyperlipidemia)   . Hypertension   . Intussusception ([place])   . Lisfranc's sprain, left, initial encounter 10/06/2017  . Lumbar pain 2011   per  x-ray - Multilevel spondylosis  . Morbid obesity ([place])   . Nephrolithiasis   . OSA on CPAP 2008  . Pneumonia   . Recurrent boils    "SLUSTER BOILS" ON ABD AND LABIA  . Sleep apnea    USES C-PAP  . Torn rotator cuff RT SHOULDER  . Tremor CCENTRAL NERVOUS SYSTEM    Patient Active Problem List   Diagnosis Date Noted  . Drug-induced constipation 05/23/2018  . Restrictive lung disease 04/12/2018  . Mixed hyperlipidemia 04/12/2018  . Vitamin D insufficiency 04/12/2018  . Hordeolum externum of right lower eyelid 02/16/2018  . Hot flashes 02/16/2018  . Ear itching 01/17/2018  . No energy 01/17/2018  . Vasomotor rhinitis 11/28/2017  . Lisfranc's dislocation, right, initial encounter 10/06/2017  . Freiberg's disease, right 10/06/2017  . Neck pain 07/11/2017  . Dystonia 09/21/2016  . Acute reaction to stress 03/28/2016  . Post-menopausal 03/16/2016  . Intertrigo 03/15/2016  . Left knee pain 03/15/2016  . Morbid obesity due to excess calories ([place]) 02/16/2016  . Right shoulder pain 02/16/2016  . Unstable balance 02/16/2016  . Chronic pain syndrome 12/15/2015  . Non-proliferative diabetic retinopathy, right eye ([place]) 11/23/2015  . Diarrhea 01/12/2015  . Ear pain 12/09/2014  . COPD (chronic obstructive pulmonary disease) with chronic bronchitis ([place]) 12/09/2014  . Type 2  diabetes, controlled, with peripheral neuropathy ([place]) 12/09/2014  . Gastroparesis due to secondary diabetes ([place]) 09/16/2014  . Diabetic peripheral neuropathy ([place]) 05/30/2014  . Diverticulitis 11/27/2013  . Hepatic steatosis 09/26/2013  . SOB (shortness of breath) on exertion 03/31/2013  . Intestinal bacterial overgrowth 09/20/2012  . Lipoma of abdominal wall 05/01/2012  . Iron deficiency anemia 03/29/2012  . History of allergic rhinitis 09/07/2011  . Preventative health care 08/16/2011  . Insomnia 02/06/2011  . OSA on CPAP   . BPPV (benign paroxysmal positional vertigo), unspecified laterality 07/14/2009  .  Depression with anxiety 01/28/2008  . Acute bilateral low back pain 11/27/2007  . Type II or unspecified type diabetes mellitus with neurological manifestations, not stated as uncontrolled(250.60) 08/08/2006  . DYSLIPIDEMIA 08/08/2006  . Essential hypertension 08/08/2006  . COPD 08/08/2006  . GERD 08/08/2006    Past Surgical History:  Procedure Laterality Date  . CARPAL TUNNEL RELEASE Left 2006  . ELBOW SURGERY Left   . HAND SURGERY Right RIGHT  . JOINT REPLACEMENT Right    shoulder  . KNEE ARTHROSCOPY Left   . LUMBAR LAMINECTOMY    . MASS EXCISION  06/28/2012   Procedure: EXCISION MASS;  Surgeon: Harl Bowie, MD;  Location: [facility];  Service: General;  Laterality: Left;  excision left upper quadrant abdominal wall mass, Benign Lipoma  . NASAL SINUS SURGERY    . PILONIDAL CYST EXCISION  [id number]  . TUBAL LIGATION      OB History   None      Home Medications    Prior to Admission medications   Medication Sig Start Date End Date Taking? Authorizing Provider  acetic acid-hydrocortisone (VOSOL-HC) OTIC solution Place 5 drops into both ears 2 (two) times daily. 01/17/18   Breeback, Jade L, PA-C  aluminum sulfate-calcium acetate (DOMEBORO) packet Mix 1 to 3 packets in 16 oz water. Apply as a compress 2 or 3 times daily for about 15 minutes. 05/28/18   Kandra Nicolas, MD  AMBULATORY NON FORMULARY MEDICATION 4 prong cane DX R06.89 07/13/17   Breeback, Jade L, PA-C  AMBULATORY NON FORMULARY MEDICATION Diabetic shoes  Dx: Diabetes, uncontrolled 250.60 with neuropathy. 02/16/18   Breeback, Jade L, PA-C  AMBULATORY NON FORMULARY MEDICATION Test strips and needles for one touch Verio IQ glucometer.  Use to test blood sugar up to four times a day. Include all supplies needed including cartridge for lancets.  Diagnosis Type 2 Diabetes 02/16/18   Iran Planas L, PA-C  azelastine (ASTELIN) 0.1 % nasal spray Place 2 sprays into both nostrils 2 (two) times daily. Use in each nostril as  directed 07/11/17   Breeback, Jade L, PA-C  buPROPion (WELLBUTRIN XL) 150 MG 24 hr tablet TAKE 1 TABLET(150 MG) BY MOUTH DAILY 12/20/17   Breeback, Jade L, PA-C  Capsaicin 0.033 % CREA Apply 1 inch topically 4 (four) times daily. 11/21/17   Gregor Hams, MD  dapagliflozin propanediol (FARXIGA) 10 MG TABS tablet Take 10 mg by mouth daily. 05/18/18   Breeback, [name] L, PA-C  doxycycline (VIBRAMYCIN) 100 MG capsule Take 1 capsule (100 mg total) by mouth 2 (two) times daily. Take with food. 05/28/18   Kandra Nicolas, MD  DULoxetine (CYMBALTA) 60 MG capsule Take 2 capsules (120 mg total) by mouth daily. 01/17/18   Breeback, Royetta Car, PA-C  ferrous sulfate 325 (65 FE) MG EC tablet Take 1 tablet (325 mg total) by mouth daily with breakfast. 05/14/18   Breeback, Jade L, PA-C  fluconazole (DIFLUCAN)  150 MG tablet Take one tab by mouth once per week for four weeks 05/28/18   Kandra Nicolas, MD  fluticasone [facility]) 50 MCG/ACT nasal spray Place 2 sprays into both nostrils daily. 05/07/18   Gregor Hams, MD  Fluticasone-Salmeterol (ADVAIR DISKUS) 250-50 MCG/DOSE AEPB inhale 1 puff by mouth twice a day 07/11/17   Iran Planas L, PA-C  gabapentin (NEURONTIN) 800 MG tablet Take 1 tablet (800 mg total) by mouth 3 (three) times daily. 02/28/18   Breeback, Royetta Car, PA-C  HYDROcodone-acetaminophen (NORCO/VICODIN) 5-325 MG tablet TAKE 1 TO 2 TABLETS BY MOUTH three times DAILY as needed 05/25/18   Hali Marry, MD  hydrOXYzine (ATARAX/VISTARIL) 50 MG tablet Take 1 tablet (50 mg total) by mouth every 8 (eight) hours as needed. For itching. Patient taking differently: Take 25 mg by mouth every 8 (eight) hours as needed. For itching. 02/23/18   Breeback, Jade L, PA-C  Insulin Pen Needle (NOVOFINE) 32G X 6 MM MISC USE 3 TIMES A DAY WITH NOVOLOG 07/11/17   Breeback, Jade L, PA-C  liraglutide (VICTOZA) 18 MG/3ML SOPN inject 1.8 milligram subcutaneously daily 01/17/18   Breeback, Jade L, PA-C  Loratadine (CLARITIN) 10 MG CAPS Take 1  capsule (10 mg total) by mouth daily. 03/21/18   Breeback, Jade L, PA-C  LORazepam (ATIVAN) 0.5 MG tablet Take 1 tablet (0.5 mg total) by mouth every 8 (eight) hours as needed for anxiety. 02/07/18   Gregor Hams, MD  lubiprostone (AMITIZA) 24 MCG capsule Take 1 capsule (24 mcg total) by mouth 2 (two) times daily with a meal. 05/18/18   Breeback, Jade L, PA-C  meclizine (ANTIVERT) 25 MG tablet Take 1 tablet (25 mg total) by mouth 3 (three) times daily as needed for dizziness. 04/12/18   Breeback, Royetta Car, PA-C  meloxicam (MOBIC) 15 MG tablet Take 1 tablet (15 mg total) by mouth as needed for pain. 12/15/17   Breeback, Jade L, PA-C  nystatin (NYSTATIN) powder APPLY EXTERNALLY TO THE AFFECTED AREA THREE TIMES DAILY AS DIRECTED 02/23/18   Breeback, Jade L, PA-C  nystatin ointment (MYCOSTATIN) apply to affected area twice a day 02/23/18   Breeback, Jade L, PA-C  olmesartan (BENICAR) 20 MG tablet TAKE 1 TABLET(20 MG) BY MOUTH DAILY 05/25/18   Breeback, Jade L, PA-C  pioglitazone (ACTOS) 30 MG tablet Take 1 tablet (30 mg total) by mouth daily. 01/17/18   Breeback, Jade L, PA-C  predniSONE (DELTASONE) 20 MG tablet Take one tab by mouth twice daily for 4 days, then one daily. Take with food. 05/28/18   Kandra Nicolas, MD  propranolol (INDERAL) 20 MG tablet TAKE 1 TABLET(20 MG) BY MOUTH TWICE DAILY 04/16/18   Donella Stade, PA-C    Family History Family History  Problem Relation Age of Onset  . Heart disease Brother   . Heart attack Brother   . Depression Brother   . Diabetes Brother   . Hypertension Brother   . Hyperlipidemia Brother   . Diabetes Mother   . Hyperlipidemia Mother   . Hypertension Mother   . Kidney disease Mother   . Depression Sister   . Diabetes Sister   . Hyperlipidemia Sister   . Hypertension Sister   . Stroke Sister   . Heart attack Brother   . Depression Brother   . Diabetes Brother   . Hyperlipidemia Brother   . Hypertension Brother   . Kidney cancer Brother   . Cancer Neg Hx  Social History Social History   Tobacco Use  . Smoking status: Former Smoker    Types: Cigarettes    Last attempt to quit: 10/17/1996    Years since quitting: 21.6  . Smokeless tobacco: Never Used  Substance Use Topics  . Alcohol use: No  . Drug use: No     Allergies   Ace inhibitors; Atrovent [ipratropium]; Cefaclor; Shrimp [shellfish allergy]; Ceclor [cefaclor]; Glyxambi [empagliflozin-linagliptin]; Sulfa antibiotics; and Sulfonamide derivatives   Review of Systems Review of Systems  Constitutional: Negative for fatigue and fever.  Gastrointestinal: Negative for abdominal pain and nausea.  Musculoskeletal: Negative for arthralgias and myalgias.  Skin: Positive for rash.  All other systems reviewed and are negative.    Physical Exam Triage Vital Signs ED Triage Vitals  Enc Vitals Group     BP 05/28/18 1701 126/72     Pulse Rate 05/28/18 1701 68     Resp --      Temp 05/28/18 1701 98.1 F (36.7 C)     Temp Source 05/28/18 1701 Oral     SpO2 05/28/18 1701 98 %     Weight 05/28/18 1702 276 lb (125.2 kg)     Height --      Head Circumference --      Peak Flow --      Pain Score 05/28/18 1701 0     Pain Loc --      Pain Edu? --      Excl. in [place]? --    No data found.  Updated Vital Signs BP 126/72 (BP Location: Right Arm)   Pulse 68   Temp 98.1 F (36.7 C) (Oral)   Wt 125.2 kg   LMP 10/18/1995   SpO2 98%   BMI 48.89 kg/m   Visual Acuity Right Eye Distance:   Left Eye Distance:   Bilateral Distance:    Right Eye Near:   Left Eye Near:    Bilateral Near:     Physical Exam  Constitutional: She appears well-developed and well-nourished. No distress.  HENT:  Head: Normocephalic.  Right Ear: External ear normal.  Left Ear: External ear normal.  Nose: Nose normal.  Mouth/Throat: Oropharynx is clear and moist.  Eyes: Pupils are equal, round, and reactive to light.  Cardiovascular: Normal rate.  Pulmonary/Chest: Effort normal.  Abdominal: There  is no tenderness.    Intertriginous area right lower abdomen erythematous, moist, and macerated with tenderness to palpation.  No swelling, induration, or fluctuance.  Neurological: She is alert.  Skin: Skin is warm and dry.  Nursing note and vitals reviewed.    UC Treatments / Results  Labs (all labs ordered are listed, but only abnormal results are displayed) Labs Reviewed  WOUND CULTURE    EKG None  Radiology No results found.  Procedures Procedures (including critical care time)  Medications Ordered in UC Medications - No data to display  Initial Impression / Assessment and Plan / UC Course  I have reviewed the triage vital signs and the nursing notes.  Pertinent labs & imaging results that were available during my care of the patient were reviewed by me and considered in my medical decision making (see chart for details).    Suspect secondary bacterial cellulitis.  Wound culture pending. Begin doxycycline 100mg  BID for one week. Begin Diflucan 150mg  once weekly for four weeks. Begin prednisone burst/taper. Rx for Domeboro soaks. Followup with Family Doctor if not improved in one week.    Final Clinical Impressions(s) / UC Diagnoses  Final diagnoses:  Candidiasis, intertrigo     Discharge Instructions     Keep area clean and dry.    ED Prescriptions    Medication Sig Dispense Auth. Provider   fluconazole (DIFLUCAN) 150 MG tablet Take one tab by mouth once per week for four weeks 4 tablet Kandra Nicolas, MD   doxycycline (VIBRAMYCIN) 100 MG capsule Take 1 capsule (100 mg total) by mouth 2 (two) times daily. Take with food. 14 capsule Kandra Nicolas, MD   predniSONE (DELTASONE) 20 MG tablet Take one tab by mouth twice daily for 4 days, then one daily. Take with food. 12 tablet Kandra Nicolas, MD   aluminum sulfate-calcium acetate (DOMEBORO) packet Mix 1 to 3 packets in 16 oz water. Apply as a compress 2 or 3 times daily for about 15 minutes. 100  each Kandra Nicolas, MD         Kandra Nicolas, MD 05/28/18 [phone]

## 2018-06-01 ENCOUNTER — Ambulatory Visit: Payer: Medicare HMO | Admitting: Family Medicine

## 2018-06-01 ENCOUNTER — Telehealth: Payer: Self-pay | Admitting: Emergency Medicine

## 2018-06-01 LAB — WOUND CULTURE
MICRO NUMBER: 90954503
SPECIMEN QUALITY:: ADEQUATE

## 2018-06-07 ENCOUNTER — Ambulatory Visit: Payer: Medicare HMO | Admitting: Family Medicine

## 2018-06-28 ENCOUNTER — Other Ambulatory Visit: Payer: Self-pay

## 2018-06-28 DIAGNOSIS — M545 Low back pain: Secondary | ICD-10-CM

## 2018-06-28 DIAGNOSIS — G894 Chronic pain syndrome: Secondary | ICD-10-CM

## 2018-06-28 DIAGNOSIS — M542 Cervicalgia: Secondary | ICD-10-CM

## 2018-06-28 MED ORDER — HYDROCODONE-ACETAMINOPHEN 5-325 MG PO TABS: ORAL_TABLET | ORAL | 0 refills | Status: DC

## 2018-06-28 NOTE — Telephone Encounter (Signed)
Requesting RF on Norco  Last RX written 05-25-18  RX pended, please review and send if appropriate  Thanks!

## 2018-07-13 ENCOUNTER — Ambulatory Visit (INDEPENDENT_AMBULATORY_CARE_PROVIDER_SITE_OTHER): Payer: Medicare HMO | Admitting: Family Medicine

## 2018-07-13 ENCOUNTER — Ambulatory Visit (INDEPENDENT_AMBULATORY_CARE_PROVIDER_SITE_OTHER): Payer: Medicare HMO

## 2018-07-13 VITALS — BP 148/54 | HR 69 | Ht 63.0 in | Wt 267.0 lb

## 2018-07-13 DIAGNOSIS — M25811 Other specified joint disorders, right shoulder: Secondary | ICD-10-CM | POA: Diagnosis not present

## 2018-07-13 DIAGNOSIS — M619 Calcification and ossification of muscle, unspecified: Secondary | ICD-10-CM | POA: Diagnosis not present

## 2018-07-13 DIAGNOSIS — Z23 Encounter for immunization: Secondary | ICD-10-CM

## 2018-07-13 DIAGNOSIS — M25511 Pain in right shoulder: Secondary | ICD-10-CM

## 2018-07-13 MED ORDER — LORAZEPAM 0.5 MG PO TABS: ORAL_TABLET | ORAL | 0 refills | Status: DC

## 2018-07-13 NOTE — Patient Instructions (Addendum)
Thank you for coming in today. I am not sure what the density on the xray is. I think it is an old calfiation associated with the rotator cuff tendon  I anticipate a MRI.  I will get back to you if that needs to be done with contrast.  I will prescribe ativan prior to MRI.  You should hear something about MRI within 1 week.  Let me know if you do not hear anything.   Take it easy with the shoulder use medicine as needed for pain.

## 2018-07-13 NOTE — Progress Notes (Signed)
[name] Rice is a 62 y.o. female who presents to [facility] today for right shoulder pain.  [name] Rice notes a several month history of worsening right shoulder pain suddenly worsening over the last week.  This is associated with some swelling in her right shoulder.  She points to the area at her lateral to anterior upper arm as the area of pain.  She has pain is present with overhead motion.  Additionally she has pain at bedtime.  She denies any pain radiating below the level of the elbow.  No fevers or chills nausea vomiting or diarrhea.  She has not really tried much treatment yet.  She is a pertinent surgical history for subacromial decompression surgery about 6 years ago.  She notes that she did pretty well after that surgery until recently.    ROS:  As above  Exam:  BP (!) 148/54   Pulse 69   Ht 5\' 3"  (1.6 m)   Wt 267 lb (121.1 kg)   LMP 10/18/1995   BMI 47.30 kg/m  General: Well Developed, well nourished, and in no acute distress.  Neuro/Psych: Alert and oriented x3, extra-ocular muscles intact, able to move all 4 extremities, sensation grossly intact. Skin: Warm and dry, no rashes noted.  Respiratory: Not using accessory muscles, speaking in full sentences, trachea midline.  Cardiovascular: Pulses palpable, no extremity edema. Abdomen: Does not appear distended. MSK: Right shoulder: Exam limited by morbid obesity body habitus.  Right shoulder slightly more swollen than left however difficult to tell.  No significant skin erythema or induration. Tender palpation at the bicipital groove in the lateral aspect of the superior part of the shoulder. Range of motion: Normal internal rotation and external rotation.  Abduction limited by pain to about 100 degrees active and 140 degrees passive. Strength intact to abduction external and internal rotation. Minimally positive Hawkins and Neer's test. Mildly positive's Speed's test.  Negative  Yergason's test. Pulses capillary refill and sensation are intact distally.  Left shoulder morbid obesity.  Otherwise normal-appearing. Nontender normal motion normal strength negative impingement testing.  C-spine: Nontender decreased motion.     Lab and Radiology Results X-ray images right shoulder personally independent reviewed. X-ray shows calcific density in the subcutaneous fat likely associated with the superficial part of the fascia or musculature at the deltoid just lateral to the proximal humerus.  No other significant acute bony changes are present.  Review of x-ray images dating back show that this is present on the chest x-ray in 2016 but not present on shoulder x-rays and MRI dated 2013 in 2012. Await formal radiology review     Assessment and Plan: 62 y.o. female with right shoulder pain.  Unclear etiology.  Pain is somewhat consistent with rotator cuff tendinitis however her exam is not entirely consistent with tendinitis.  She does have this unusual calcification external to the joint located in the subcutaneous tissue likely just superficial to the fascial layer seen on x-ray today.  This is certainly a possibility for cause of pain.  Given her uncertain exam as well as unusual x-ray findings I think the next reasonable step here is MRI to further evaluate the cause of her shoulder pain as well as this calcific density.  I discussed with radiology who notes that they should be able to see and evaluate the shoulder pain as well as the calcific density without contrast.  Plan for noncontrast shoulder MRI.  Recheck following MRI.  Flu vaccine given in  the left deltoid today.  Orders Placed This Encounter  Procedures  . DG Shoulder Right    Standing Status:   Future    Standing Expiration Date:   09/13/2019    Order Specific Question:   Reason for Exam (SYMPTOM  OR DIAGNOSIS REQUIRED)    Answer:   eval right shoulder pain    Order Specific Question:   Preferred imaging  location?    Answer:   [facility]    Order Specific Question:   Radiology Contrast Protocol - do NOT remove file path    Answer:   [email or web address]  . Flu Vaccine QUAD 6+ mos PF IM (Fluarix Quad PF)   No orders of the defined types were placed in this encounter.   Historical information moved to improve visibility of documentation.  Past Medical History:  Diagnosis Date  . Anxiety   . Arthritis   . Asthma    no PFTs  . Cervical dystonia   . COPD (chronic obstructive pulmonary disease) (HCC)    no PFTs  . Depression   . Diabetes mellitus   . Diarrhea   . Diverticulitis   . Diverticulosis   . Dizziness   . Gastroparesis   . GERD (gastroesophageal reflux disease)   . History of colonic polyps    hyperplastic  . History of kidney stones   . History of seborrheic dermatitis    face,ears  . HLD (hyperlipidemia)   . Hypertension   . Intussusception ([place])   . Lisfranc's sprain, left, initial encounter 10/06/2017  . Lumbar pain 2011   per x-ray - Multilevel spondylosis  . Morbid obesity ([place])   . Nephrolithiasis   . OSA on CPAP 2008  . Pneumonia   . Recurrent boils    "SLUSTER BOILS" ON ABD AND LABIA  . Sleep apnea    USES C-PAP  . Torn rotator cuff RT SHOULDER  . Tremor CCENTRAL NERVOUS SYSTEM   Past Surgical History:  Procedure Laterality Date  . CARPAL TUNNEL RELEASE Left 2006  . ELBOW SURGERY Left   . HAND SURGERY Right RIGHT  . JOINT REPLACEMENT Right    shoulder  . KNEE ARTHROSCOPY Left   . LUMBAR LAMINECTOMY    . MASS EXCISION  06/28/2012   Procedure: EXCISION MASS;  Surgeon: Harl Bowie, MD;  Location: [facility];  Service: General;  Laterality: Left;  excision left upper quadrant abdominal wall mass, Benign Lipoma  . NASAL SINUS SURGERY    . PILONIDAL CYST EXCISION  [id number]  . TUBAL LIGATION     Social History   Tobacco Use  . Smoking status: Former Smoker    Types: Cigarettes    Last attempt  to quit: 10/17/1996    Years since quitting: 21.7  . Smokeless tobacco: Never Used  Substance Use Topics  . Alcohol use: No   family history includes Depression in her brother, brother, and sister; Diabetes in her brother, brother, mother, and sister; Heart attack in her brother and brother; Heart disease in her brother; Hyperlipidemia in her brother, brother, mother, and sister; Hypertension in her brother, brother, mother, and sister; Kidney cancer in her brother; Kidney disease in her mother; Stroke in her sister.  Medications: Current Outpatient Medications  Medication Sig Dispense Refill  . acetic acid-hydrocortisone (VOSOL-HC) OTIC solution Place 5 drops into both ears 2 (two) times daily. 10 mL 0  . aluminum sulfate-calcium acetate (DOMEBORO) packet Mix 1 to 3 packets in 16 oz water. Apply as a compress 2  or 3 times daily for about 15 minutes. 100 each 12  . AMBULATORY NON FORMULARY MEDICATION 4 prong cane DX R06.89 1 each 0  . AMBULATORY NON FORMULARY MEDICATION Diabetic shoes  Dx: Diabetes, uncontrolled 250.60 with neuropathy. 1 Device 0  . AMBULATORY NON FORMULARY MEDICATION Test strips and needles for one touch Verio IQ glucometer.  Use to test blood sugar up to four times a day. Include all supplies needed including cartridge for lancets.  Diagnosis Type 2 Diabetes 100 each 3  . azelastine (ASTELIN) 0.1 % nasal spray Place 2 sprays into both nostrils 2 (two) times daily. Use in each nostril as directed 30 mL 6  . buPROPion (WELLBUTRIN XL) 150 MG 24 hr tablet TAKE 1 TABLET(150 MG) BY MOUTH DAILY 90 tablet 2  . Capsaicin 0.033 % CREA Apply 1 inch topically 4 (four) times daily. 56.6 g 12  . dapagliflozin propanediol (FARXIGA) 10 MG TABS tablet Take 10 mg by mouth daily. 30 tablet 2  . doxycycline (VIBRAMYCIN) 100 MG capsule Take 1 capsule (100 mg total) by mouth 2 (two) times daily. Take with food. 14 capsule 0  . DULoxetine (CYMBALTA) 60 MG capsule Take 2 capsules (120 mg total) by  mouth daily. 60 capsule 5  . ferrous sulfate 325 (65 FE) MG EC tablet Take 1 tablet (325 mg total) by mouth daily with breakfast. 90 tablet 4  . fluconazole (DIFLUCAN) 150 MG tablet Take one tab by mouth once per week for four weeks 4 tablet 0  . fluticasone (FLONASE) 50 MCG/ACT nasal spray Place 2 sprays into both nostrils daily. 16 g 2  . Fluticasone-Salmeterol (ADVAIR DISKUS) 250-50 MCG/DOSE AEPB inhale 1 puff by mouth twice a day 60 each 11  . gabapentin (NEURONTIN) 800 MG tablet Take 1 tablet (800 mg total) by mouth 3 (three) times daily. 90 tablet 5  . HYDROcodone-acetaminophen (NORCO/VICODIN) 5-325 MG tablet TAKE 1 TO 2 TABLETS BY MOUTH three times DAILY as needed 90 tablet 0  . hydrOXYzine (ATARAX/VISTARIL) 50 MG tablet Take 1 tablet (50 mg total) by mouth every 8 (eight) hours as needed. For itching. (Patient taking differently: Take 25 mg by mouth every 8 (eight) hours as needed. For itching.) 90 tablet 2  . Insulin Pen Needle (NOVOFINE) 32G X 6 MM MISC USE 3 TIMES A DAY WITH NOVOLOG 100 each 2  . liraglutide (VICTOZA) 18 MG/3ML SOPN inject 1.8 milligram subcutaneously daily 6 mL 2  . Loratadine (CLARITIN) 10 MG CAPS Take 1 capsule (10 mg total) by mouth daily. 30 each 11  . LORazepam (ATIVAN) 0.5 MG tablet Take 1 tablet (0.5 mg total) by mouth every 8 (eight) hours as needed for anxiety. 20 tablet 0  . lubiprostone (AMITIZA) 24 MCG capsule Take 1 capsule (24 mcg total) by mouth 2 (two) times daily with a meal. 60 capsule 2  . meclizine (ANTIVERT) 25 MG tablet Take 1 tablet (25 mg total) by mouth 3 (three) times daily as needed for dizziness. 30 tablet 0  . meloxicam (MOBIC) 15 MG tablet Take 1 tablet (15 mg total) by mouth as needed for pain. 30 tablet 2  . nystatin (NYSTATIN) powder APPLY EXTERNALLY TO THE AFFECTED AREA THREE TIMES DAILY AS DIRECTED 60 g prn  . nystatin ointment (MYCOSTATIN) apply to affected area twice a day 60 g 1  . olmesartan (BENICAR) 20 MG tablet TAKE 1 TABLET(20  MG) BY MOUTH DAILY 90 tablet 0  . pioglitazone (ACTOS) 30 MG tablet Take 1 tablet (30 mg  total) by mouth daily. 90 tablet 1  . predniSONE (DELTASONE) 20 MG tablet Take one tab by mouth twice daily for 4 days, then one daily. Take with food. 12 tablet 0  . propranolol (INDERAL) 20 MG tablet TAKE 1 TABLET(20 MG) BY MOUTH TWICE DAILY 180 tablet 0   No current facility-administered medications for this visit.    Allergies  Allergen Reactions  . Ace Inhibitors Other (See Comments)    Causes blood pressure to drop rapidly  . Atrovent [Ipratropium] Swelling    Throat swelling on Atorvent Nasal Spray  . Cefaclor Hives and Itching  . Shrimp [Shellfish Allergy] Nausea And Vomiting  . Ceclor [Cefaclor]   . Glyxambi [Empagliflozin-Linagliptin]     Itching/rash  . Sulfa Antibiotics   . Sulfonamide Derivatives Rash      Discussed warning signs or symptoms. Please see discharge instructions. Patient expresses understanding.

## 2018-07-19 DIAGNOSIS — R0602 Shortness of breath: Secondary | ICD-10-CM | POA: Diagnosis not present

## 2018-07-19 DIAGNOSIS — J984 Other disorders of lung: Secondary | ICD-10-CM | POA: Diagnosis not present

## 2018-07-19 DIAGNOSIS — Z6841 Body Mass Index (BMI) 40.0 and over, adult: Secondary | ICD-10-CM | POA: Diagnosis not present

## 2018-07-20 ENCOUNTER — Encounter: Payer: Self-pay | Admitting: Family Medicine

## 2018-07-26 DIAGNOSIS — Z6841 Body Mass Index (BMI) 40.0 and over, adult: Secondary | ICD-10-CM | POA: Diagnosis not present

## 2018-07-26 DIAGNOSIS — J984 Other disorders of lung: Secondary | ICD-10-CM | POA: Diagnosis not present

## 2018-07-26 DIAGNOSIS — R0600 Dyspnea, unspecified: Secondary | ICD-10-CM | POA: Diagnosis not present

## 2018-07-27 DIAGNOSIS — E1136 Type 2 diabetes mellitus with diabetic cataract: Secondary | ICD-10-CM | POA: Diagnosis not present

## 2018-07-27 DIAGNOSIS — Z79899 Other long term (current) drug therapy: Secondary | ICD-10-CM | POA: Diagnosis not present

## 2018-07-27 DIAGNOSIS — Z794 Long term (current) use of insulin: Secondary | ICD-10-CM | POA: Diagnosis not present

## 2018-07-27 DIAGNOSIS — R0602 Shortness of breath: Secondary | ICD-10-CM | POA: Diagnosis not present

## 2018-07-27 DIAGNOSIS — I10 Essential (primary) hypertension: Secondary | ICD-10-CM | POA: Diagnosis not present

## 2018-07-27 DIAGNOSIS — R079 Chest pain, unspecified: Secondary | ICD-10-CM | POA: Diagnosis not present

## 2018-07-27 DIAGNOSIS — H269 Unspecified cataract: Secondary | ICD-10-CM | POA: Diagnosis not present

## 2018-07-27 DIAGNOSIS — R69 Illness, unspecified: Secondary | ICD-10-CM | POA: Diagnosis not present

## 2018-07-27 DIAGNOSIS — E1165 Type 2 diabetes mellitus with hyperglycemia: Secondary | ICD-10-CM | POA: Diagnosis not present

## 2018-07-27 DIAGNOSIS — R1013 Epigastric pain: Secondary | ICD-10-CM | POA: Diagnosis not present

## 2018-07-27 DIAGNOSIS — Z882 Allergy status to sulfonamides status: Secondary | ICD-10-CM | POA: Diagnosis not present

## 2018-07-27 DIAGNOSIS — J449 Chronic obstructive pulmonary disease, unspecified: Secondary | ICD-10-CM | POA: Diagnosis not present

## 2018-07-27 DIAGNOSIS — E1140 Type 2 diabetes mellitus with diabetic neuropathy, unspecified: Secondary | ICD-10-CM | POA: Diagnosis not present

## 2018-07-27 MED ORDER — ALBUTEROL SULFATE HFA 108 (90 BASE) MCG/ACT IN AERS
2.00 | INHALATION_SPRAY | RESPIRATORY_TRACT | Status: DC
Start: 2018-07-27 — End: 2018-07-27

## 2018-07-28 ENCOUNTER — Ambulatory Visit (HOSPITAL_BASED_OUTPATIENT_CLINIC_OR_DEPARTMENT_OTHER)
Admission: RE | Admit: 2018-07-28 | Discharge: 2018-07-28 | Disposition: A | Discharge status: Home / Self Care | Payer: Medicare HMO | Source: Ambulatory Visit | Attending: Family Medicine | Admitting: Family Medicine

## 2018-07-28 DIAGNOSIS — M25511 Pain in right shoulder: Secondary | ICD-10-CM | POA: Diagnosis not present

## 2018-07-28 DIAGNOSIS — M19011 Primary osteoarthritis, right shoulder: Secondary | ICD-10-CM | POA: Diagnosis not present

## 2018-07-30 ENCOUNTER — Other Ambulatory Visit: Payer: Self-pay

## 2018-07-30 DIAGNOSIS — M545 Low back pain, unspecified: Secondary | ICD-10-CM

## 2018-07-30 DIAGNOSIS — I10 Essential (primary) hypertension: Secondary | ICD-10-CM

## 2018-07-30 DIAGNOSIS — M542 Cervicalgia: Secondary | ICD-10-CM

## 2018-07-30 DIAGNOSIS — G894 Chronic pain syndrome: Secondary | ICD-10-CM

## 2018-07-30 MED ORDER — HYDROCODONE-ACETAMINOPHEN 5-325 MG PO TABS: ORAL_TABLET | ORAL | 0 refills | Status: DC

## 2018-07-30 MED ORDER — PROPRANOLOL HCL 20 MG PO TABS: ORAL_TABLET | ORAL | 0 refills | Status: DC

## 2018-07-30 NOTE — Telephone Encounter (Signed)
Requesting RF on Norco   Last RX sent 06/28/18  RX pended, please send if appropriate  PCP out of office, sending to covering provider

## 2018-07-30 NOTE — Telephone Encounter (Signed)
Pt advised.

## 2018-08-02 ENCOUNTER — Ambulatory Visit: Payer: Medicare HMO | Admitting: Family Medicine

## 2018-08-02 ENCOUNTER — Other Ambulatory Visit: Payer: Self-pay | Admitting: Physician Assistant

## 2018-08-02 DIAGNOSIS — E1142 Type 2 diabetes mellitus with diabetic polyneuropathy: Secondary | ICD-10-CM

## 2018-08-06 ENCOUNTER — Encounter: Payer: Self-pay | Admitting: Family Medicine

## 2018-08-06 ENCOUNTER — Ambulatory Visit (INDEPENDENT_AMBULATORY_CARE_PROVIDER_SITE_OTHER): Payer: Medicare HMO | Admitting: Family Medicine

## 2018-08-06 VITALS — BP 161/67 | HR 105 | Ht 63.0 in | Wt 266.0 lb

## 2018-08-06 DIAGNOSIS — M25511 Pain in right shoulder: Secondary | ICD-10-CM | POA: Diagnosis not present

## 2018-08-06 DIAGNOSIS — M25571 Pain in right ankle and joints of right foot: Secondary | ICD-10-CM

## 2018-08-06 DIAGNOSIS — E1142 Type 2 diabetes mellitus with diabetic polyneuropathy: Secondary | ICD-10-CM

## 2018-08-06 DIAGNOSIS — G8929 Other chronic pain: Secondary | ICD-10-CM | POA: Diagnosis not present

## 2018-08-06 DIAGNOSIS — G894 Chronic pain syndrome: Secondary | ICD-10-CM | POA: Diagnosis not present

## 2018-08-06 NOTE — Progress Notes (Signed)
[name] Rice is a 62 y.o. female who presents to [facility] today for follow-up right shoulder pain.  [name] was seen for right shoulder pain in late September.  X-ray showed a bony mass subcutaneously in the right shoulder.  She had follow-up MRI which shows that the bony mass was outside of the shoulder joint and outside of the tendinous structures.  She did have some tendinitis and possibly a small tear.  She notes continued right shoulder pain failing to improve despite home exercise program.   She has bothersome right ankle pain as well and would like an injection if possible.  She notes her blood sugars been a bit elevated recently with diabetic medication changes.  She has a follow-up appoint with her primary care provider on Friday, October 25.  She has blood sugars are typically in the 200s now.  She has restarted her Lantus insulin.  ROS:  As above  Exam:  BP (!) 161/67   Pulse (!) 105   Ht 5\' 3"  (1.6 m)   Wt 266 lb (120.7 kg)   LMP 10/18/1995   BMI 47.12 kg/m  General: Well Developed, well nourished, and in no acute distress.  Neuro/Psych: Alert and oriented x3, extra-ocular muscles intact, able to move all 4 extremities, sensation grossly intact. Skin: Warm and dry, no rashes noted.  Respiratory: Not using accessory muscles, speaking in full sentences, trachea midline.  Cardiovascular: Pulses palpable, no extremity edema. Abdomen: Does not appear distended. MSK:  Right shoulder: Normal-appearing nontender decreased motion due to pain.  Positive impingement testing.  Right ankle: Slightly swollen with effusion in the right lateral ATFL area.  Lab and Radiology Results EXAM: MRI OF THE RIGHT SHOULDER WITHOUT CONTRAST  TECHNIQUE: Multiplanar, multisequence MR imaging of the shoulder was performed. No intravenous contrast was administered.  COMPARISON:  Right shoulder x-rays dated July 13, 2018. MRI right shoulder  dated July 26, 2011.  FINDINGS: Limited study due to large body habitus and prominent subcutaneous fat over the deltoid.  Rotator cuff: Mild supraspinatus and infraspinatus tendinosis. Possible focal low-grade bursal surface tear of the mid to distal supraspinatus tendon. The infraspinatus, subscapularis, and teres minor tendons are intact.  Muscles: No atrophy or abnormal signal of the muscles of the rotator cuff.  Biceps long head:  Grossly intact and normally positioned.  Acromioclavicular Joint: Mild arthropathy of the acromioclavicular joint. Subacromial spurring with mass effect on the mid infraspinatus tendon. Type I acromion. No significant subacromial/subdeltoid bursal fluid.  Glenohumeral Joint: No joint effusion. No chondral defect.  Labrum: Grossly intact, but evaluation is limited by lack of intraarticular fluid.  Bones:  No marrow abnormality, fracture or dislocation.  Other: 2.4 cm oval dark T1 and T2 signal intensity in the subcutaneous fat overlying the posterosuperior deltoid muscle corresponds to the calcification seen on x-ray. This is likely dystrophic in etiology related to prior surgery. Susceptibility artifact near the acromion.  IMPRESSION: 1. Limited study. Mild supraspinatus and infraspinatus tendinosis. Possible focal low-grade bursal surface tear of the mid to distal supraspinatus tendon. 2. Mild acromioclavicular osteoarthritis. 3. 2.4 cm calcification seen on x-ray is located within the subcutaneous fat overlying the posterosuperior deltoid muscle and is likely dystrophic in etiology, potentially related to prior surgery.   Electronically Signed   By: Titus Dubin M.D.   On: 07/29/2018 13:21  I personally (independently) visualized and performed the interpretation of the images attached in this note.   Procedure: Real-time Ultrasound Guided Injection of right  subacromial bursa Device: GE Logiq E   Images permanently  stored and available for review in the ultrasound unit. Verbal informed consent obtained.  Discussed risks and benefits of procedure. Warned about infection bleeding damage to structures skin hypopigmentation and fat atrophy among others. Patient expresses understanding and agreement Time-out conducted.   Noted no overlying erythema, induration, or other signs of local infection.   Skin prepped in a sterile fashion.   Local anesthesia: Topical Ethyl chloride.   With sterile technique and under real time ultrasound guidance:  40 mg of Kenalog and 2 mL of Marcaine injected easily.   Completed without difficulty using spinal needle Pain immediately resolved suggesting accurate placement of the medication.   Advised to call if fevers/chills, erythema, induration, drainage, or persistent bleeding.   Images permanently stored and available for review in the ultrasound unit.  Impression: Technically successful ultrasound guided injection. Ultrasound guidance  needed due to body habitus.       Assessment and Plan: 62 y.o. female with  Right shoulder pain rotator cuff tendinopathy.  Injection as above.  Continue home exercise program.  Recheck in a few days.  At that time we will inject the right ankle if still hurting.  Would like to avoid double dose of steroids giving blood sugars not well controlled currently.  Recheck Friday, October 25 after PCP visit.    No orders of the defined types were placed in this encounter.  No orders of the defined types were placed in this encounter.   Historical information moved to improve visibility of documentation.  Past Medical History:  Diagnosis Date  . Anxiety   . Arthritis   . Asthma    no PFTs  . Cervical dystonia   . COPD (chronic obstructive pulmonary disease) (HCC)    no PFTs  . Depression   . Diabetes mellitus   . Diarrhea   . Diverticulitis   . Diverticulosis   . Dizziness   . Gastroparesis   . GERD (gastroesophageal reflux  disease)   . History of colonic polyps    hyperplastic  . History of kidney stones   . History of seborrheic dermatitis    face,ears  . HLD (hyperlipidemia)   . Hypertension   . Intussusception ([place])   . Lisfranc's sprain, left, initial encounter 10/06/2017  . Lumbar pain 2011   per x-ray - Multilevel spondylosis  . Morbid obesity ([place])   . Nephrolithiasis   . OSA on CPAP 2008  . Pneumonia   . Recurrent boils    "SLUSTER BOILS" ON ABD AND LABIA  . Sleep apnea    USES C-PAP  . Torn rotator cuff RT SHOULDER  . Tremor CCENTRAL NERVOUS SYSTEM   Past Surgical History:  Procedure Laterality Date  . CARPAL TUNNEL RELEASE Left 2006  . ELBOW SURGERY Left   . HAND SURGERY Right RIGHT  . KNEE ARTHROSCOPY Left   . LUMBAR LAMINECTOMY    . MASS EXCISION  06/28/2012   Procedure: EXCISION MASS;  Surgeon: Harl Bowie, MD;  Location: [facility];  Service: General;  Laterality: Left;  excision left upper quadrant abdominal wall mass, Benign Lipoma  . NASAL SINUS SURGERY    . PILONIDAL CYST EXCISION  [id number]  . SUBACROMIAL DECOMPRESSION Right   . TUBAL LIGATION     Social History   Tobacco Use  . Smoking status: Former Smoker    Types: Cigarettes    Last attempt to quit: 10/17/1996    Years since quitting: 21.8  .  Smokeless tobacco: Never Used  Substance Use Topics  . Alcohol use: No   family history includes Depression in her brother, brother, and sister; Diabetes in her brother, brother, mother, and sister; Heart attack in her brother and brother; Heart disease in her brother; Hyperlipidemia in her brother, brother, mother, and sister; Hypertension in her brother, brother, mother, and sister; Kidney cancer in her brother; Kidney disease in her mother; Stroke in her sister.  Medications: Current Outpatient Medications  Medication Sig Dispense Refill  . acetic acid-hydrocortisone (VOSOL-HC) OTIC solution Place 5 drops into both ears 2 (two) times daily. 10 mL 0  . AMBULATORY NON  FORMULARY MEDICATION 4 prong cane DX R06.89 1 each 0  . AMBULATORY NON FORMULARY MEDICATION Diabetic shoes  Dx: Diabetes, uncontrolled 250.60 with neuropathy. 1 Device 0  . AMBULATORY NON FORMULARY MEDICATION Test strips and needles for one touch Verio IQ glucometer.  Use to test blood sugar up to four times a day. Include all supplies needed including cartridge for lancets.  Diagnosis Type 2 Diabetes 100 each 3  . azelastine (ASTELIN) 0.1 % nasal spray Place 2 sprays into both nostrils 2 (two) times daily. Use in each nostril as directed 30 mL 6  . buPROPion (WELLBUTRIN XL) 150 MG 24 hr tablet TAKE 1 TABLET(150 MG) BY MOUTH DAILY 90 tablet 2  . Capsaicin 0.033 % CREA Apply 1 inch topically 4 (four) times daily. 56.6 g 12  . dapagliflozin propanediol (FARXIGA) 10 MG TABS tablet Take 10 mg by mouth daily. 30 tablet 2  . doxycycline (VIBRAMYCIN) 100 MG capsule Take 1 capsule (100 mg total) by mouth 2 (two) times daily. Take with food. 14 capsule 0  . DULoxetine (CYMBALTA) 60 MG capsule Take 2 capsules (120 mg total) by mouth daily. 60 capsule 5  . ferrous sulfate 325 (65 FE) MG EC tablet Take 1 tablet (325 mg total) by mouth daily with breakfast. 90 tablet 4  . fluticasone (FLONASE) 50 MCG/ACT nasal spray Place 2 sprays into both nostrils daily. 16 g 2  . gabapentin (NEURONTIN) 800 MG tablet Take 1 tablet (800 mg total) by mouth 3 (three) times daily. 90 tablet 5  . HYDROcodone-acetaminophen (NORCO/VICODIN) 5-325 MG tablet TAKE 1 TO 2 TABLETS BY MOUTH three times DAILY as needed 90 tablet 0  . hydrOXYzine (ATARAX/VISTARIL) 50 MG tablet Take 1 tablet (50 mg total) by mouth every 8 (eight) hours as needed. For itching. (Patient taking differently: Take 25 mg by mouth every 8 (eight) hours as needed. For itching.) 90 tablet 2  . Insulin Pen Needle (NOVOFINE) 32G X 6 MM MISC USE 3 TIMES A DAY WITH NOVOLOG 100 each 2  . liraglutide (VICTOZA) 18 MG/3ML SOPN ADMINISTER 1.8 MG UNDER THE SKIN DAILY 6 mL 0  .  Loratadine (CLARITIN) 10 MG CAPS Take 1 capsule (10 mg total) by mouth daily. 30 each 11  . LORazepam (ATIVAN) 0.5 MG tablet Take 1 tablet (0.5 mg total) by mouth every 8 (eight) hours as needed for anxiety. 20 tablet 0  . LORazepam (ATIVAN) 0.5 MG tablet 1-2 tabs 30 - 60 min prior to MRI. Do not drive with this medicine. 4 tablet 0  . lubiprostone (AMITIZA) 24 MCG capsule Take 1 capsule (24 mcg total) by mouth 2 (two) times daily with a meal. 60 capsule 2  . meclizine (ANTIVERT) 25 MG tablet Take 1 tablet (25 mg total) by mouth 3 (three) times daily as needed for dizziness. 30 tablet 0  . meloxicam (MOBIC)  15 MG tablet Take 1 tablet (15 mg total) by mouth as needed for pain. 30 tablet 2  . nystatin (NYSTATIN) powder APPLY EXTERNALLY TO THE AFFECTED AREA THREE TIMES DAILY AS DIRECTED 60 g prn  . nystatin ointment (MYCOSTATIN) apply to affected area twice a day 60 g 1  . olmesartan (BENICAR) 20 MG tablet TAKE 1 TABLET(20 MG) BY MOUTH DAILY 90 tablet 0  . pioglitazone (ACTOS) 30 MG tablet Take 1 tablet (30 mg total) by mouth daily. 90 tablet 1  . propranolol (INDERAL) 20 MG tablet TAKE 1 TABLET(20 MG) BY MOUTH TWICE DAILY 180 tablet 0   No current facility-administered medications for this visit.    Allergies  Allergen Reactions  . Ace Inhibitors Other (See Comments)    Causes blood pressure to drop rapidly  . Atrovent [Ipratropium] Swelling    Throat swelling on Atorvent Nasal Spray  . Cefaclor Hives and Itching  . Shrimp [Shellfish Allergy] Nausea And Vomiting  . Ceclor [Cefaclor]   . Glyxambi [Empagliflozin-Linagliptin]     Itching/rash  . Sulfa Antibiotics   . Sulfonamide Derivatives Rash      Discussed warning signs or symptoms. Please see discharge instructions. Patient expresses understanding.

## 2018-08-06 NOTE — Patient Instructions (Addendum)
Thank you for coming in today. Schedule (ok to double book) ankle injection on Friday morning) Call or go to the ER if you develop a large red swollen joint with extreme pain or oozing puss.  Continue insulin.   Check sugars daily fasting.  If above 120 ok to restart insulin.  Follow up with [facility] as discussed.   Call or go to the ER if you develop a large red swollen joint with extreme pain or oozing puss.

## 2018-08-07 ENCOUNTER — Other Ambulatory Visit: Payer: Self-pay

## 2018-08-07 DIAGNOSIS — E1142 Type 2 diabetes mellitus with diabetic polyneuropathy: Secondary | ICD-10-CM

## 2018-08-07 MED ORDER — INSULIN PEN NEEDLE 32G X 6 MM MISC: 2 refills | Status: DC

## 2018-08-10 ENCOUNTER — Ambulatory Visit (INDEPENDENT_AMBULATORY_CARE_PROVIDER_SITE_OTHER): Payer: Medicare HMO | Admitting: Physician Assistant

## 2018-08-10 ENCOUNTER — Encounter: Payer: Self-pay | Admitting: Physician Assistant

## 2018-08-10 ENCOUNTER — Ambulatory Visit (INDEPENDENT_AMBULATORY_CARE_PROVIDER_SITE_OTHER): Payer: Medicare HMO | Admitting: Family Medicine

## 2018-08-10 VITALS — BP 148/54

## 2018-08-10 VITALS — BP 149/60 | HR 61 | Ht 63.0 in | Wt 266.0 lb

## 2018-08-10 DIAGNOSIS — R0602 Shortness of breath: Secondary | ICD-10-CM

## 2018-08-10 DIAGNOSIS — M25511 Pain in right shoulder: Secondary | ICD-10-CM | POA: Diagnosis not present

## 2018-08-10 DIAGNOSIS — G8929 Other chronic pain: Secondary | ICD-10-CM

## 2018-08-10 DIAGNOSIS — E1142 Type 2 diabetes mellitus with diabetic polyneuropathy: Secondary | ICD-10-CM

## 2018-08-10 DIAGNOSIS — I10 Essential (primary) hypertension: Secondary | ICD-10-CM | POA: Diagnosis not present

## 2018-08-10 DIAGNOSIS — M25571 Pain in right ankle and joints of right foot: Secondary | ICD-10-CM

## 2018-08-10 LAB — POCT GLYCOSYLATED HEMOGLOBIN (HGB A1C): Hemoglobin A1C: 7 % — AB (ref 4.0–5.6)

## 2018-08-10 MED ORDER — FUROSEMIDE 20 MG PO TABS: 20.0000 mg | ORAL_TABLET | Freq: Two times a day (BID) | ORAL | 2 refills | Status: DC

## 2018-08-10 MED ORDER — INSULIN GLARGINE 100 UNIT/ML SOLOSTAR PEN: 40.0000 [IU] | PEN_INJECTOR | Freq: Every day | SUBCUTANEOUS | 1 refills | Status: DC

## 2018-08-10 NOTE — Patient Instructions (Signed)
Thank you for coming in today. Call or go to the ER if you develop a large red swollen joint with extreme pain or oozing puss.  Recheck with me as needed.  You should hear from orthopedics soon about shoulder consultation.  Return sooner if needed.

## 2018-08-10 NOTE — Progress Notes (Addendum)
[name] Rice is a 62 y.o. female who presents to [place] [facility] today for right ankle pain.  [name] Rice has been having bothersome right ankle pain now for some time.  She has a history of right foot Lisfranc surgery.  She has pain in the right lateral ankle worse with activity better with rest.  She notes some pain at the posterior ankle as well.  She is tried some conservative management including compression and topical diclofenac gel which have not helped.  She would like an injection as that has helped previously.  Additionally she was seen on 21 October for right shoulder pain.  She had an injection that has not helped much.  At this point she is interested in referral to orthopedic surgery for a potential second opinion and consultation if possible.    ROS:  As above  Exam:   BP 149/60    Pulse 61   Ht 5\' 3"  (1.6 m)   Wt 266 lb (120.7 kg)   LMP 10/18/1995   SpO2 99%   BMI 47.12 kg/m   BSA 2.32 m General: Well Developed, well nourished, and in no acute distress.  Neuro/Psych: Alert and oriented x3, extra-ocular muscles intact, able to move all 4 extremities, sensation grossly intact. Skin: Warm and dry, no rashes noted.  Respiratory: Not using accessory muscles, speaking in full sentences, trachea midline.  Cardiovascular: Pulses palpable, no extremity edema. Abdomen: Does not appear distended. MSK:  Right ankle swollen at the ATFL area.  Otherwise ankle is unremarkable appearing with normal motion.  Tender to palpation at the lateral ankle at the anterior portion of the lateral malleolus along the inferior into the posterior portion. Intact strength however without significant pain with resisted eversion.  Dorsal foot well-appearing scar.  Nontender.  Pulses cap refill and sensation are intact.  Procedure: Real-time Ultrasound Guided Injection of right lateral ankle joint Device: GE Logiq E   Images permanently stored and available  for review in the ultrasound unit. Verbal informed consent obtained.  Discussed risks and benefits of procedure. Warned about infection bleeding damage to structures skin hypopigmentation and fat atrophy among others. Patient expresses understanding and agreement Time-out conducted.   Noted no overlying erythema, induration, or other signs of local infection.   Skin prepped in a sterile fashion.   Local anesthesia: Topical Ethyl chloride.   With sterile technique and under real time ultrasound guidance:  40 mg of Kenalog and 1.5 mL of Marcaine injected easily.   Completed without difficulty   Pain  resolved suggesting accurate placement of the medication.   Advised to call if fevers/chills, erythema, induration, drainage, or persistent bleeding.   Images permanently stored and available for review in the ultrasound unit.  Impression: Technically successful ultrasound guided injection.         Assessment and Plan: 62 y.o. female with right lateral ankle pain.  DJD concern.  Plan for injection and home exercise program.  Recheck if not improving.  May consider peroneal tendon injection if not improving.  Additional refer to orthopedic surgery for second opinion regarding shoulder.  MRI showed calcifications as previously noted which may be impacting her ability to have resolution of her shoulder pain with injection.    Orders Placed This Encounter  Procedures  . Ambulatory referral to Orthopedic Surgery    Referral Priority:   Routine    Referral Type:   Surgical    Referral Reason:   Specialty Services Required  Requested Specialty:   Orthopedic Surgery    Number of Visits Requested:   1   No orders of the defined types were placed in this encounter.   Historical information moved to improve visibility of documentation.  Past Medical History:  Diagnosis Date  . Anxiety   . Arthritis   . Asthma    no PFTs  . Cervical dystonia   . COPD (chronic obstructive pulmonary  disease) (HCC)    no PFTs  . Depression   . Diabetes mellitus   . Diarrhea   . Diverticulitis   . Diverticulosis   . Dizziness   . Gastroparesis   . GERD (gastroesophageal reflux disease)   . History of colonic polyps    hyperplastic  . History of kidney stones   . History of seborrheic dermatitis    face,ears  . HLD (hyperlipidemia)   . Hypertension   . Intussusception ([place])   . Lisfranc's sprain, left, initial encounter 10/06/2017  . Lumbar pain 2011   per x-ray - Multilevel spondylosis  . Morbid obesity ([place])   . Nephrolithiasis   . OSA on CPAP 2008  . Pneumonia   . Recurrent boils    "SLUSTER BOILS" ON ABD AND LABIA  . Sleep apnea    USES C-PAP  . Torn rotator cuff RT SHOULDER  . Tremor CCENTRAL NERVOUS SYSTEM   Past Surgical History:  Procedure Laterality Date  . CARPAL TUNNEL RELEASE Left 2006  . ELBOW SURGERY Left   . HAND SURGERY Right RIGHT  . KNEE ARTHROSCOPY Left   . LUMBAR LAMINECTOMY    . MASS EXCISION  06/28/2012   Procedure: EXCISION MASS;  Surgeon: Harl Bowie, MD;  Location: [facility];  Service: General;  Laterality: Left;  excision left upper quadrant abdominal wall mass, Benign Lipoma  . NASAL SINUS SURGERY    . PILONIDAL CYST EXCISION  [id number]  . SUBACROMIAL DECOMPRESSION Right   . TUBAL LIGATION     Social History   Tobacco Use  . Smoking status: Former Smoker    Types: Cigarettes    Last attempt to quit: 10/17/1996    Years since quitting: 21.8  . Smokeless tobacco: Never Used  Substance Use Topics  . Alcohol use: No   family history includes Depression in her brother, brother, and sister; Diabetes in her brother, brother, mother, and sister; Heart attack in her brother and brother; Heart disease in her brother; Hyperlipidemia in her brother, brother, mother, and sister; Hypertension in her brother, brother, mother, and sister; Kidney cancer in her brother; Kidney disease in her mother; Stroke in her sister.  Medications: Current  Outpatient Medications  Medication Sig Dispense Refill  . acetic acid-hydrocortisone (VOSOL-HC) OTIC solution Place 5 drops into both ears 2 (two) times daily. 10 mL 0  . AMBULATORY NON FORMULARY MEDICATION 4 prong cane DX R06.89 1 each 0  . AMBULATORY NON FORMULARY MEDICATION Diabetic shoes  Dx: Diabetes, uncontrolled 250.60 with neuropathy. 1 Device 0  . AMBULATORY NON FORMULARY MEDICATION Test strips and needles for one touch Verio IQ glucometer.  Use to test blood sugar up to four times a day. Include all supplies needed including cartridge for lancets.  Diagnosis Type 2 Diabetes 100 each 3  . azelastine (ASTELIN) 0.1 % nasal spray Place 2 sprays into both nostrils 2 (two) times daily. Use in each nostril as directed 30 mL 6  . buPROPion (WELLBUTRIN XL) 150 MG 24 hr tablet TAKE 1 TABLET(150 MG) BY MOUTH DAILY 90 tablet  2  . Capsaicin 0.033 % CREA Apply 1 inch topically 4 (four) times daily. 56.6 g 12  . dapagliflozin propanediol (FARXIGA) 10 MG TABS tablet Take 10 mg by mouth daily. 30 tablet 2  . DULoxetine (CYMBALTA) 60 MG capsule Take 2 capsules (120 mg total) by mouth daily. 60 capsule 5  . ferrous sulfate 325 (65 FE) MG EC tablet Take 1 tablet (325 mg total) by mouth daily with breakfast. 90 tablet 4  . fluticasone (FLONASE) 50 MCG/ACT nasal spray Place 2 sprays into both nostrils daily. 16 g 2  . furosemide (LASIX) 20 MG tablet Take 1 tablet (20 mg total) by mouth 2 (two) times daily. 60 tablet 2  . gabapentin (NEURONTIN) 800 MG tablet Take 1 tablet (800 mg total) by mouth 3 (three) times daily. 90 tablet 5  . HYDROcodone-acetaminophen (NORCO/VICODIN) 5-325 MG tablet TAKE 1 TO 2 TABLETS BY MOUTH three times DAILY as needed 90 tablet 0  . hydrOXYzine (ATARAX/VISTARIL) 50 MG tablet Take 1 tablet (50 mg total) by mouth every 8 (eight) hours as needed. For itching. (Patient taking differently: Take 25 mg by mouth every 8 (eight) hours as needed. For itching.) 90 tablet 2  . Insulin Glargine  (LANTUS SOLOSTAR) 100 UNIT/ML Solostar Pen Inject 40 Units into the skin at bedtime. Please include QS 3 months. 36 mL 1  . Insulin Pen Needle (NOVOFINE) 32G X 6 MM MISC USE 3 TIMES A DAY WITH NOVOLOG 100 each 2  . liraglutide (VICTOZA) 18 MG/3ML SOPN ADMINISTER 1.8 MG UNDER THE SKIN DAILY 6 mL 0  . Loratadine (CLARITIN) 10 MG CAPS Take 1 capsule (10 mg total) by mouth daily. 30 each 11  . LORazepam (ATIVAN) 0.5 MG tablet Take 1 tablet (0.5 mg total) by mouth every 8 (eight) hours as needed for anxiety. 20 tablet 0  . lubiprostone (AMITIZA) 24 MCG capsule Take 1 capsule (24 mcg total) by mouth 2 (two) times daily with a meal. 60 capsule 2  . meclizine (ANTIVERT) 25 MG tablet Take 1 tablet (25 mg total) by mouth 3 (three) times daily as needed for dizziness. 30 tablet 0  . meloxicam (MOBIC) 15 MG tablet Take 1 tablet (15 mg total) by mouth as needed for pain. 30 tablet 2  . nystatin (NYSTATIN) powder APPLY EXTERNALLY TO THE AFFECTED AREA THREE TIMES DAILY AS DIRECTED 60 g prn  . nystatin ointment (MYCOSTATIN) apply to affected area twice a day 60 g 1  . olmesartan (BENICAR) 20 MG tablet TAKE 1 TABLET(20 MG) BY MOUTH DAILY 90 tablet 0  . pioglitazone (ACTOS) 30 MG tablet Take 1 tablet (30 mg total) by mouth daily. 90 tablet 1  . propranolol (INDERAL) 20 MG tablet TAKE 1 TABLET(20 MG) BY MOUTH TWICE DAILY 180 tablet 0   No current facility-administered medications for this visit.    Allergies  Allergen Reactions  . Ace Inhibitors Other (See Comments)    Causes blood pressure to drop rapidly  . Atrovent [Ipratropium] Swelling    Throat swelling on Atorvent Nasal Spray  . Cefaclor Hives and Itching  . Glyxambi [Empagliflozin-Linagliptin]     Itching/rash  . Shrimp [Shellfish Allergy] Nausea And Vomiting  . Ceclor [Cefaclor]   . Sulfa Antibiotics   . Sulfonamide Derivatives Rash      Discussed warning signs or symptoms. Please see discharge instructions. Patient expresses  understanding.

## 2018-08-10 NOTE — Progress Notes (Signed)
Subjective:    Patient ID: [name] Rice, female    DOB: May 17, 1956, 62 y.o.   MRN: [id number]  HPI  Pt is a 62 yo obese female with HTN, T2DM, GERd who presents to the clinic for 3 month follow up.   She went to the hospital at the beginning of the month for worrisome SOB on exertion. SOB with exertion has been going on for a few months. We did some lung function testing and found restrictive lung disease. Pt has seen pulmonology and suspect the restriction could be coming from weight. Hospital did CTA, cardiac labs, EKG, CXR that were all normal. Her lasix was increased to 40mg  daily. He does seem to help some with fluid.   DM- she has noticed her sugars rising. She has started back taking insulin 40 units at bedtime. Not aware of any lows. No open wounds.   She did not take her BP medication this morning.   .. Active Ambulatory Problems    Diagnosis Date Noted  . Type II or unspecified type diabetes mellitus with neurological manifestations, not stated as uncontrolled(250.60) 08/08/2006  . DYSLIPIDEMIA 08/08/2006  . Depression with anxiety 01/28/2008  . Essential hypertension 08/08/2006  . GERD 08/08/2006  . Acute bilateral low back pain 11/27/2007  . BPPV (benign paroxysmal positional vertigo), unspecified laterality 07/14/2009  . OSA on CPAP   . Insomnia 02/06/2011  . Preventative health care 08/16/2011  . History of allergic rhinitis 09/07/2011  . Iron deficiency anemia 03/29/2012  . Lipoma of abdominal wall 05/01/2012  . Intestinal bacterial overgrowth 09/20/2012  . SOB (shortness of breath) on exertion 03/31/2013  . Hepatic steatosis 09/26/2013  . Diverticulitis 11/27/2013  . Diabetic peripheral neuropathy ([place]) 05/30/2014  . Gastroparesis due to secondary diabetes ([place]) 09/16/2014  . Ear pain 12/09/2014  . Type 2 diabetes, controlled, with peripheral neuropathy ([place]) 12/09/2014  . Diarrhea 01/12/2015  . Non-proliferative diabetic retinopathy, right eye ([place])  11/23/2015  . Chronic pain syndrome 12/15/2015  . Morbid obesity due to excess calories ([place]) 02/16/2016  . Right shoulder pain 02/16/2016  . Unstable balance 02/16/2016  . Intertrigo 03/15/2016  . Left knee pain 03/15/2016  . Post-menopausal 03/16/2016  . Acute reaction to stress 03/28/2016  . Dystonia 09/21/2016  . Neck pain 07/11/2017  . Lisfranc's dislocation, right, initial encounter 10/06/2017  . Freiberg's disease, right 10/06/2017  . Vasomotor rhinitis 11/28/2017  . Ear itching 01/17/2018  . No energy 01/17/2018  . Hordeolum externum of right lower eyelid 02/16/2018  . Hot flashes 02/16/2018  . Restrictive lung disease 04/12/2018  . Mixed hyperlipidemia 04/12/2018  . Vitamin D insufficiency 04/12/2018  . Drug-induced constipation 05/23/2018  . Calcification and ossification of muscle 07/13/2018   Resolved Ambulatory Problems    Diagnosis Date Noted  . IRON DEFICIENCY 01/30/2007  . DEPRESSION 08/08/2006  . TORTICOLLIS, SPASMODIC 02/13/2007  . SINUSITIS, ACUTE 05/27/2009  . ALLERGIC RHINITIS 12/25/2006  . ASTHMA 01/28/2008  . COPD 08/08/2006  . DIVERTICULOSIS, COLON 02/12/2003  . DERMATITIS, SEBORRHEIC 01/30/2007  . GOUT, HX OF 08/08/2006  . PERSONAL HX COLONIC POLYPS 09/16/2008  . NEPHROLITHIASIS, HX OF 01/28/2008  . Calf pain 11/29/2010  . Tremor, essential 02/06/2011  . Seborrheic dermatitis, unspecified 04/06/2011  . Finger pain 04/06/2011  . Shoulder pain, right 08/16/2011  . Left leg pain 04/10/2012  . Right ear pain 05/01/2012  . Seborrheic dermatitis 05/01/2012  . Hypoglycemia 05/01/2012  . Vaginal candidiasis 08/22/2012  . Sigmoid diverticulitis 09/15/2012  . Acute-on-chronic kidney disease  Stage II 09/15/2012  . Cyst, breast 03/01/2013  . Lactic acidosis 03/31/2013  . Acute renal insufficiency 03/31/2013  . Abdominal pain, left lower quadrant 04/29/2013  . Gout 05/01/2013  . COPD (chronic obstructive pulmonary disease) with chronic bronchitis  ([place]) 12/09/2014  . Fall 03/16/2016  . Foot fracture, right, closed, initial encounter 08/23/2017   Past Medical History:  Diagnosis Date  . Anxiety   . Arthritis   . Asthma   . Cervical dystonia   . COPD (chronic obstructive pulmonary disease) ([place])   . Depression   . Diabetes mellitus   . Diverticulosis   . Dizziness   . Gastroparesis   . GERD (gastroesophageal reflux disease)   . History of colonic polyps   . History of kidney stones   . History of seborrheic dermatitis   . HLD (hyperlipidemia)   . Hypertension   . Intussusception ([place])   . Lisfranc's sprain, left, initial encounter 10/06/2017  . Lumbar pain 2011  . Morbid obesity ([place])   . Nephrolithiasis   . Pneumonia   . Recurrent boils   . Sleep apnea   . Torn rotator cuff RT SHOULDER  . Tremor CCENTRAL NERVOUS SYSTEM        Review of Systems See HPI.     Objective:   Physical Exam  Constitutional: She is oriented to person, place, and time. She appears well-developed and well-nourished.  Obese.   Cardiovascular: Normal rate and regular rhythm.  Pulmonary/Chest: Effort normal and breath sounds normal. She has no wheezes.  Neurological: She is alert and oriented to person, place, and time.  Skin:  No significant edema. Scant on right ankle.   Psychiatric: She has a normal mood and affect. Her behavior is normal.          Assessment & Plan:  Marland KitchenMarland KitchenDiagnoses and all orders for this visit:  Type 2 diabetes, controlled, with peripheral neuropathy (HCC) -     POCT glycosylated hemoglobin (Hb A1C) -     Insulin Glargine (LANTUS SOLOSTAR) 100 UNIT/ML Solostar Pen; Inject 40 Units into the skin at bedtime. Please include QS 3 months.  SOB (shortness of breath) on exertion -     furosemide (LASIX) 20 MG tablet; Take 1 tablet (20 mg total) by mouth 2 (two) times daily.   .. Lab Results  Component Value Date   HGBA1C 7.0 (A) 08/10/2018     Up from 6.1 to 7.0 Added back lantus.  On ACE/ARB On STATIN.   BP not controlled but did not take medication this am.  Needs dM eye exam.   Continue to follow up with pulmonology on SOB. Continue lasix 40mg  and decrease to 20mg  as needed. No significant edema on exam today. Discussed weight and to keep moving. Watch calories.

## 2018-08-20 ENCOUNTER — Ambulatory Visit: Payer: Medicare HMO | Admitting: Physician Assistant

## 2018-08-27 ENCOUNTER — Other Ambulatory Visit: Payer: Self-pay | Admitting: Physician Assistant

## 2018-08-27 DIAGNOSIS — I10 Essential (primary) hypertension: Secondary | ICD-10-CM

## 2018-08-27 DIAGNOSIS — E118 Type 2 diabetes mellitus with unspecified complications: Secondary | ICD-10-CM

## 2018-08-27 DIAGNOSIS — Z794 Long term (current) use of insulin: Secondary | ICD-10-CM

## 2018-08-27 DIAGNOSIS — E1142 Type 2 diabetes mellitus with diabetic polyneuropathy: Secondary | ICD-10-CM

## 2018-09-11 ENCOUNTER — Encounter: Payer: Self-pay | Admitting: Physician Assistant

## 2018-09-11 ENCOUNTER — Ambulatory Visit (INDEPENDENT_AMBULATORY_CARE_PROVIDER_SITE_OTHER): Payer: Medicare HMO | Admitting: Physician Assistant

## 2018-09-11 VITALS — BP 106/42 | HR 73 | Temp 97.9°F | Wt 261.0 lb

## 2018-09-11 DIAGNOSIS — J329 Chronic sinusitis, unspecified: Secondary | ICD-10-CM

## 2018-09-11 DIAGNOSIS — R42 Dizziness and giddiness: Secondary | ICD-10-CM

## 2018-09-11 MED ORDER — AMOXICILLIN-POT CLAVULANATE 875-125 MG PO TABS: 1.0000 | ORAL_TABLET | Freq: Two times a day (BID) | ORAL | 0 refills | Status: DC

## 2018-09-11 MED ORDER — PREDNISONE 20 MG PO TABS: ORAL_TABLET | ORAL | 0 refills | Status: DC

## 2018-09-11 NOTE — Patient Instructions (Signed)

## 2018-09-11 NOTE — Progress Notes (Signed)
Subjective:    Patient ID: [name] Rice, female    DOB: [birth year], 62 y.o.   MRN: [id number]  HPI  Pt is a 62 yo female with HTN, COPD, BPPV, T2DM, recurrent sinusitis who presents to the clinic with daughter for 1 month of sinus symptoms. She reports that symptoms started about one month ago. At first they were intermittent with bilateral ear popping and head pressure. They have progressvely gotten worse. She feels like " her ears are on fire". She has lots of head pressure and headache. She has some nasal congestion. No fever, chills, body aches, nausea, or vomiting. She does have a lot of dizziness. She has hx of BPPV. She has not taken any antivert nor done any epley manuevers. She has been taking a lot of OTC cold medications and mucinex.   .. Active Ambulatory Problems    Diagnosis Date Noted  . Type II or unspecified type diabetes mellitus with neurological manifestations, not stated as uncontrolled(250.60) 08/08/2006  . DYSLIPIDEMIA 08/08/2006  . Depression with anxiety 01/28/2008  . Essential hypertension 08/08/2006  . GERD 08/08/2006  . Acute bilateral low back pain 11/27/2007  . BPPV (benign paroxysmal positional vertigo), unspecified laterality 07/14/2009  . OSA on CPAP   . Insomnia 02/06/2011  . Preventative health care 08/16/2011  . History of allergic rhinitis 09/07/2011  . Iron deficiency anemia 03/29/2012  . Lipoma of abdominal wall 05/01/2012  . Intestinal bacterial overgrowth 09/20/2012  . SOB (shortness of breath) on exertion 03/31/2013  . Hepatic steatosis 09/26/2013  . Diverticulitis 11/27/2013  . Diabetic peripheral neuropathy ([place]) 05/30/2014  . Gastroparesis due to secondary diabetes ([place]) 09/16/2014  . Ear pain 12/09/2014  . Type 2 diabetes, controlled, with peripheral neuropathy ([place]) 12/09/2014  . Diarrhea 01/12/2015  . Non-proliferative diabetic retinopathy, right eye ([place]) 11/23/2015  . Chronic pain syndrome 12/15/2015  . Morbid obesity due to  excess calories ([place]) 02/16/2016  . Right shoulder pain 02/16/2016  . Unstable balance 02/16/2016  . Intertrigo 03/15/2016  . Left knee pain 03/15/2016  . Post-menopausal 03/16/2016  . Acute reaction to stress 03/28/2016  . Dystonia 09/21/2016  . Neck pain 07/11/2017  . Lisfranc's dislocation, right, initial encounter 10/06/2017  . Freiberg's disease, right 10/06/2017  . Vasomotor rhinitis 11/28/2017  . Ear itching 01/17/2018  . No energy 01/17/2018  . Hordeolum externum of right lower eyelid 02/16/2018  . Hot flashes 02/16/2018  . Restrictive lung disease 04/12/2018  . Mixed hyperlipidemia 04/12/2018  . Vitamin D insufficiency 04/12/2018  . Drug-induced constipation 05/23/2018  . Calcification and ossification of muscle 07/13/2018   Resolved Ambulatory Problems    Diagnosis Date Noted  . IRON DEFICIENCY 01/30/2007  . DEPRESSION 08/08/2006  . TORTICOLLIS, SPASMODIC 02/13/2007  . SINUSITIS, ACUTE 05/27/2009  . ALLERGIC RHINITIS 12/25/2006  . ASTHMA 01/28/2008  . COPD 08/08/2006  . DIVERTICULOSIS, COLON 02/12/2003  . DERMATITIS, SEBORRHEIC 01/30/2007  . GOUT, HX OF 08/08/2006  . PERSONAL HX COLONIC POLYPS 09/16/2008  . NEPHROLITHIASIS, HX OF 01/28/2008  . Calf pain 11/29/2010  . Tremor, essential 02/06/2011  . Seborrheic dermatitis, unspecified 04/06/2011  . Finger pain 04/06/2011  . Shoulder pain, right 08/16/2011  . Left leg pain 04/10/2012  . Right ear pain 05/01/2012  . Seborrheic dermatitis 05/01/2012  . Hypoglycemia 05/01/2012  . Vaginal candidiasis 08/22/2012  . Sigmoid diverticulitis 09/15/2012  . Acute-on-chronic kidney disease Stage II 09/15/2012  . Cyst, breast 03/01/2013  . Lactic acidosis 03/31/2013  . Acute renal insufficiency 03/31/2013  .  Abdominal pain, left lower quadrant 04/29/2013  . Gout 05/01/2013  . COPD (chronic obstructive pulmonary disease) with chronic bronchitis ([place]) 12/09/2014  . Fall 03/16/2016  . Foot fracture, right, closed,  initial encounter 08/23/2017   Past Medical History:  Diagnosis Date  . Anxiety   . Arthritis   . Asthma   . Cervical dystonia   . COPD (chronic obstructive pulmonary disease) ([place])   . Depression   . Diabetes mellitus   . Diverticulosis   . Dizziness   . Gastroparesis   . GERD (gastroesophageal reflux disease)   . History of colonic polyps   . History of kidney stones   . History of seborrheic dermatitis   . HLD (hyperlipidemia)   . Hypertension   . Intussusception ([place])   . Lisfranc's sprain, left, initial encounter 10/06/2017  . Lumbar pain 2011  . Morbid obesity ([place])   . Nephrolithiasis   . Pneumonia   . Recurrent boils   . Sleep apnea   . Torn rotator cuff RT SHOULDER  . Tremor CCENTRAL NERVOUS SYSTEM      Review of Systems See HPI.     Objective:   Physical Exam  Constitutional: She is oriented to person, place, and time.  In a wheelchair due to dizziness.   HENT:  Head: Normocephalic and atraumatic.  Right Ear: External ear normal.  Left Ear: External ear normal.  Oropharynx erythematous with PND.  Bilateral nasal turbinates red and swollen.  Tenderness over frontal and maxillary sinuses.   Eyes: Conjunctivae are normal.  Cardiovascular: Normal rate and regular rhythm.  Pulmonary/Chest: Effort normal.  Lymphadenopathy:    She has cervical adenopathy.  Neurological: She is alert and oriented to person, place, and time.  Not able to perform dix hallpike manever due to dizziness with all changes in position.   Skin: No rash noted.  Psychiatric: She has a normal mood and affect. Her behavior is normal.          Assessment & Plan:  Marland KitchenMarland KitchenDiagnoses and all orders for this visit:  Recurrent sinusitis -     amoxicillin-clavulanate (AUGMENTIN) 875-125 MG tablet; Take 1 tablet by mouth 2 (two) times daily. -     predniSONE (DELTASONE) 20 MG tablet; Take one tablet twice a day for 5 days.  Vertigo -     predniSONE (DELTASONE) 20 MG tablet; Take one  tablet twice a day for 5 days.   Symptoms consistent with sinusitis. augmentin given with prednisone to help with symptoms. Likely some ETD. Prednisone should help with that. Hx of BPPV and with some much head pressure likely sinusitis as the cause. She has antivert at home she can use for vertigo and to start epley maneuver. She states her grandson and daughter can help with her. Use wheelchair and walker until dizziness improves. If worsens or not improving let me know.

## 2018-09-12 ENCOUNTER — Telehealth: Payer: Self-pay

## 2018-09-12 NOTE — Telephone Encounter (Signed)
[name] called and left a message stating she is only slightly better today. She states if she still feels bad on Friday she will go to the UC. She stated [name] wanted an update on how she feels.

## 2018-09-17 ENCOUNTER — Other Ambulatory Visit: Payer: Self-pay | Admitting: Physician Assistant

## 2018-09-17 DIAGNOSIS — E1142 Type 2 diabetes mellitus with diabetic polyneuropathy: Secondary | ICD-10-CM

## 2018-09-17 DIAGNOSIS — Z794 Long term (current) use of insulin: Secondary | ICD-10-CM

## 2018-09-17 DIAGNOSIS — E118 Type 2 diabetes mellitus with unspecified complications: Secondary | ICD-10-CM

## 2018-09-18 ENCOUNTER — Other Ambulatory Visit: Payer: Self-pay

## 2018-09-18 DIAGNOSIS — F331 Major depressive disorder, recurrent, moderate: Secondary | ICD-10-CM

## 2018-09-18 MED ORDER — BUPROPION HCL ER (XL) 150 MG PO TB24: ORAL_TABLET | ORAL | 2 refills | Status: DC

## 2018-09-20 ENCOUNTER — Telehealth: Payer: Self-pay

## 2018-09-20 DIAGNOSIS — M545 Low back pain, unspecified: Secondary | ICD-10-CM

## 2018-09-20 DIAGNOSIS — G894 Chronic pain syndrome: Secondary | ICD-10-CM

## 2018-09-20 DIAGNOSIS — M542 Cervicalgia: Secondary | ICD-10-CM

## 2018-09-20 NOTE — Telephone Encounter (Signed)
Pt wanting to know if Dr Georgina Snell can do an ankle injection for her if she just finished a round of prednisone and is still currently taking Augmentin for a sinus infection.   Also- wanting Norco RF  Please advise

## 2018-09-21 MED ORDER — HYDROCODONE-ACETAMINOPHEN 5-325 MG PO TABS: ORAL_TABLET | ORAL | 0 refills | Status: DC

## 2018-09-21 NOTE — Telephone Encounter (Signed)
Left a message advising of recommendations.  

## 2018-09-21 NOTE — Telephone Encounter (Signed)
Yes I can do a steroid injection in the ankle however will result in higher blood sugars.  Recommend scheduling next week. Hydrocodone refilled

## 2018-09-25 ENCOUNTER — Ambulatory Visit (INDEPENDENT_AMBULATORY_CARE_PROVIDER_SITE_OTHER): Payer: Medicare HMO | Admitting: Family Medicine

## 2018-09-25 ENCOUNTER — Telehealth: Payer: Self-pay

## 2018-09-25 ENCOUNTER — Ambulatory Visit: Payer: Medicare HMO | Admitting: Family Medicine

## 2018-09-25 VITALS — BP 172/82 | HR 91 | Wt 267.0 lb

## 2018-09-25 DIAGNOSIS — E11621 Type 2 diabetes mellitus with foot ulcer: Secondary | ICD-10-CM

## 2018-09-25 DIAGNOSIS — E1142 Type 2 diabetes mellitus with diabetic polyneuropathy: Secondary | ICD-10-CM | POA: Diagnosis not present

## 2018-09-25 DIAGNOSIS — L97509 Non-pressure chronic ulcer of other part of unspecified foot with unspecified severity: Secondary | ICD-10-CM | POA: Diagnosis not present

## 2018-09-25 DIAGNOSIS — L89890 Pressure ulcer of other site, unstageable: Secondary | ICD-10-CM | POA: Diagnosis not present

## 2018-09-25 DIAGNOSIS — J0101 Acute recurrent maxillary sinusitis: Secondary | ICD-10-CM

## 2018-09-25 DIAGNOSIS — I10 Essential (primary) hypertension: Secondary | ICD-10-CM | POA: Diagnosis not present

## 2018-09-25 DIAGNOSIS — M25571 Pain in right ankle and joints of right foot: Secondary | ICD-10-CM

## 2018-09-25 DIAGNOSIS — G8929 Other chronic pain: Secondary | ICD-10-CM

## 2018-09-25 DIAGNOSIS — E1159 Type 2 diabetes mellitus with other circulatory complications: Secondary | ICD-10-CM | POA: Diagnosis not present

## 2018-09-25 DIAGNOSIS — I152 Hypertension secondary to endocrine disorders: Secondary | ICD-10-CM

## 2018-09-25 MED ORDER — AZITHROMYCIN 250 MG PO TABS: 250.0000 mg | ORAL_TABLET | Freq: Every day | ORAL | 0 refills | Status: DC

## 2018-09-25 NOTE — Patient Instructions (Addendum)
Thank you for coming in today. Call or go to the ER if you develop a large red swollen joint with extreme pain or oozing puss.  Keep the wound clean.  Use the DuoDerm dressing. Change it every 3 days or so if it it gets dirty.   You should hear form home health wound care soon.   Recheck in 1 week.

## 2018-09-25 NOTE — Progress Notes (Signed)
[name] Rice is a 62 y.o. female who presents to [facility]: [facility] today for foot and ankle pain.  [name] Rice has a history of right ankle pain due to DJD.  She is done well in the past with steroid injections and would like a repeat injection if possible.  Her last injection was October 25 and it worked until recently.  Additionally she she notes that she has been having pain on the plantar aspect of her foot.  She cannot recall any injury or laceration.  She notes that she had a bit of blood in her shoe when asked in retrospect.  Additionally she notes that she is been having some sinusitis infection symptoms.  She was seen by her PCP who treated with antibiotics recently.  She notes this does not help much.  She has been using Augmentin and prednisone which did not help.  She notes continued nasal pain and congestion and discharge.   ROS as above:  Exam:  BP (!) 172/82   Pulse 91   Wt 267 lb (121.1 kg)   LMP 10/18/1995   BMI 47.30 kg/m  Wt Readings from Last 5 Encounters:  09/25/18 267 lb (121.1 kg)  09/11/18 261 lb (118.4 kg)  08/10/18 266 lb (120.7 kg)  08/06/18 266 lb (120.7 kg)  07/13/18 267 lb (121.1 kg)    Gen: Well NAD HEENT: EOMI,  MMM clear nasal discharge.  Inflamed nasal turbinates. Lungs: Normal work of breathing. CTABL Heart: RRR no MRG Abd: NABS, Soft. Nondistended, Nontender Exts: Brisk capillary refill, warm and well perfused.  Right ankle: Mild ankle effusion.  Tender to palpation lateral joint line.  Pain with ankle motion.  Stable ligamentous exam. Right foot palmar foot with small 1 cm unstageable ulceration or wound.  Patient has decreased sensation right foot.  Pulses are intact.  Procedure: Real-time Ultrasound Guided Injection of right ankle Device: GE Logiq E   Images permanently stored and available for review in the ultrasound  unit. Verbal informed consent obtained.  Discussed risks and benefits of procedure. Warned about infection bleeding damage to structures skin hypopigmentation and fat atrophy among others. Patient expresses understanding and agreement Time-out conducted.   Noted no overlying erythema, induration, or other signs of local infection.   Skin prepped in a sterile fashion.   Local anesthesia: Topical Ethyl chloride.   With sterile technique and under real time ultrasound guidance:  40 mg of Kenalog and 1 mL of Marcaine injected easily.   Completed without difficulty   Pain immediately resolved suggesting accurate placement of the medication.   Advised to call if fevers/chills, erythema, induration, drainage, or persistent bleeding.   Images permanently stored and available for review in the ultrasound unit.  Impression: Technically successful ultrasound guided injection.        Assessment and Plan: 62 y.o. female with  Right foot wound/ulcer.  Unclear if this is a acute wound or a pressure wound.  She does not recall any injury or blood on the floor therefore I think it is more of semiacute pressure wound.  Plan for hydrocolloid dressing and recheck in 1 week.  Will refer for home health wound care.  Patient is high risk for worsening or infection given her history of diabetes with peripheral neuropathy.  Right ankle pain: DJD.  Repeat injection today.  Will space the next injection out for at least 3 months.  Discussed warning signs or symptoms.  Sinusitis not  improved with Augmentin and prednisone.  Switch to azithromycin recheck in 1 week.  Hypertension today.  Blood pressure elevated watchful waiting recheck 1 week.  Adjust medication at follow-up if needed.  Morbid obesity is complicating her medical problems.  Weight loss should be at goal in the near future.  No orders of the defined types were placed in this encounter.  No orders of the defined types were placed in this  encounter.    Historical information moved to improve visibility of documentation.  Past Medical History:  Diagnosis Date  . Anxiety   . Arthritis   . Asthma    no PFTs  . Cervical dystonia   . COPD (chronic obstructive pulmonary disease) (HCC)    no PFTs  . Depression   . Diabetes mellitus   . Diarrhea   . Diverticulitis   . Diverticulosis   . Dizziness   . Gastroparesis   . GERD (gastroesophageal reflux disease)   . History of colonic polyps    hyperplastic  . History of kidney stones   . History of seborrheic dermatitis    face,ears  . HLD (hyperlipidemia)   . Hypertension   . Intussusception ([place])   . Lisfranc's sprain, left, initial encounter 10/06/2017  . Lumbar pain 2011   per x-ray - Multilevel spondylosis  . Morbid obesity ([place])   . Nephrolithiasis   . OSA on CPAP 2008  . Pneumonia   . Recurrent boils    "SLUSTER BOILS" ON ABD AND LABIA  . Sleep apnea    USES C-PAP  . Torn rotator cuff RT SHOULDER  . Tremor CCENTRAL NERVOUS SYSTEM   Past Surgical History:  Procedure Laterality Date  . CARPAL TUNNEL RELEASE Left 2006  . ELBOW SURGERY Left   . HAND SURGERY Right RIGHT  . KNEE ARTHROSCOPY Left   . LUMBAR LAMINECTOMY    . MASS EXCISION  06/28/2012   Procedure: EXCISION MASS;  Surgeon: Harl Bowie, MD;  Location: [facility];  Service: General;  Laterality: Left;  excision left upper quadrant abdominal wall mass, Benign Lipoma  . NASAL SINUS SURGERY    . PILONIDAL CYST EXCISION  [id number]  . SUBACROMIAL DECOMPRESSION Right   . TUBAL LIGATION     Social History   Tobacco Use  . Smoking status: Former Smoker    Types: Cigarettes    Last attempt to quit: 10/17/1996    Years since quitting: 21.9  . Smokeless tobacco: Never Used  Substance Use Topics  . Alcohol use: No   family history includes Depression in her brother, brother, and sister; Diabetes in her brother, brother, mother, and sister; Heart attack in her brother and brother; Heart disease in  her brother; Hyperlipidemia in her brother, brother, mother, and sister; Hypertension in her brother, brother, mother, and sister; Kidney cancer in her brother; Kidney disease in her mother; Stroke in her sister.  Medications: Current Outpatient Medications  Medication Sig Dispense Refill  . acetic acid-hydrocortisone (VOSOL-HC) OTIC solution Place 5 drops into both ears 2 (two) times daily. 10 mL 0  . AMBULATORY NON FORMULARY MEDICATION 4 prong cane DX R06.89 1 each 0  . AMBULATORY NON FORMULARY MEDICATION Diabetic shoes  Dx: Diabetes, uncontrolled 250.60 with neuropathy. 1 Device 0  . AMBULATORY NON FORMULARY MEDICATION Test strips and needles for one touch Verio IQ glucometer.  Use to test blood sugar up to four times a day. Include all supplies needed including cartridge for lancets.  Diagnosis Type 2 Diabetes 100 each  3  . amoxicillin-clavulanate (AUGMENTIN) 875-125 MG tablet Take 1 tablet by mouth 2 (two) times daily. 20 tablet 0  . azelastine (ASTELIN) 0.1 % nasal spray Place 2 sprays into both nostrils 2 (two) times daily. Use in each nostril as directed 30 mL 6  . buPROPion (WELLBUTRIN XL) 150 MG 24 hr tablet TAKE 1 TABLET(150 MG) BY MOUTH DAILY 90 tablet 2  . Capsaicin 0.033 % CREA Apply 1 inch topically 4 (four) times daily. 56.6 g 12  . dapagliflozin propanediol (FARXIGA) 10 MG TABS tablet Take 10 mg by mouth daily. 30 tablet 2  . DULoxetine (CYMBALTA) 60 MG capsule TAKE 2 CAPSULES(120 MG) BY MOUTH DAILY 60 capsule 0  . ferrous sulfate 325 (65 FE) MG EC tablet Take 1 tablet (325 mg total) by mouth daily with breakfast. 90 tablet 4  . fluticasone (FLONASE) 50 MCG/ACT nasal spray Place 2 sprays into both nostrils daily. 16 g 2  . furosemide (LASIX) 20 MG tablet Take 1 tablet (20 mg total) by mouth 2 (two) times daily. 60 tablet 2  . gabapentin (NEURONTIN) 800 MG tablet Take 1 tablet (800 mg total) by mouth 3 (three) times daily. 90 tablet 5  . HYDROcodone-acetaminophen (NORCO/VICODIN)  5-325 MG tablet TAKE 1 TO 2 TABLETS BY MOUTH three times DAILY as needed 90 tablet 0  . hydrOXYzine (ATARAX/VISTARIL) 50 MG tablet Take 1 tablet (50 mg total) by mouth every 8 (eight) hours as needed. For itching. (Patient taking differently: Take 25 mg by mouth every 8 (eight) hours as needed. For itching.) 90 tablet 2  . Insulin Glargine (LANTUS SOLOSTAR) 100 UNIT/ML Solostar Pen Inject 40 Units into the skin at bedtime. Please include QS 3 months. 36 mL 1  . Insulin Pen Needle (NOVOFINE) 32G X 6 MM MISC USE 3 TIMES A DAY WITH NOVOLOG 100 each 2  . liraglutide (VICTOZA) 18 MG/3ML SOPN ADMINISTER 1.8 MG UNDER THE SKIN DAILY 6 mL 0  . Loratadine (CLARITIN) 10 MG CAPS Take 1 capsule (10 mg total) by mouth daily. 30 each 11  . LORazepam (ATIVAN) 0.5 MG tablet Take 1 tablet (0.5 mg total) by mouth every 8 (eight) hours as needed for anxiety. 20 tablet 0  . lubiprostone (AMITIZA) 24 MCG capsule Take 1 capsule (24 mcg total) by mouth 2 (two) times daily with a meal. 60 capsule 2  . meclizine (ANTIVERT) 25 MG tablet Take 1 tablet (25 mg total) by mouth 3 (three) times daily as needed for dizziness. 30 tablet 0  . meloxicam (MOBIC) 15 MG tablet Take 1 tablet (15 mg total) by mouth as needed for pain. 30 tablet 2  . nystatin (NYSTATIN) powder APPLY EXTERNALLY TO THE AFFECTED AREA THREE TIMES DAILY AS DIRECTED 60 g prn  . nystatin ointment (MYCOSTATIN) apply to affected area twice a day 60 g 1  . olmesartan (BENICAR) 20 MG tablet TAKE 1 TABLET(20 MG) BY MOUTH DAILY 90 tablet 0  . pioglitazone (ACTOS) 30 MG tablet TAKE 1 TABLET BY MOUTH DAILY 90 tablet 0  . predniSONE (DELTASONE) 20 MG tablet Take one tablet twice a day for 5 days. 10 tablet 0  . propranolol (INDERAL) 20 MG tablet TAKE 1 TABLET(20 MG) BY MOUTH TWICE DAILY 180 tablet 0   No current facility-administered medications for this visit.    Allergies  Allergen Reactions  . Ace Inhibitors Other (See Comments)    Causes blood pressure to drop  rapidly  . Atrovent [Ipratropium] Swelling    Throat swelling on  Atorvent Nasal Spray  . Cefaclor Hives and Itching  . Glyxambi [Empagliflozin-Linagliptin]     Itching/rash  . Shrimp [Shellfish Allergy] Nausea And Vomiting  . Ceclor [Cefaclor]   . Sulfa Antibiotics   . Sulfonamide Derivatives Rash     Discussed warning signs or symptoms. Please see discharge instructions. Patient expresses understanding.

## 2018-09-25 NOTE — Telephone Encounter (Signed)
[facility] called and states she noticed a nail in her bedroom shoe and this is the cause of the wound. She wanted to know if she should come back for a tetanus. Last one was 2012. She reports trouble swallowing and neck problems.

## 2018-09-26 NOTE — Telephone Encounter (Signed)
Patient advised of recommendations.  

## 2018-09-26 NOTE — Telephone Encounter (Signed)
Left message advising of recommendations.  

## 2018-09-26 NOTE — Telephone Encounter (Signed)
She should be okay from a tetanus perspective. The neck pain and swallowing is a torticollis issue and not new for her.

## 2018-10-02 ENCOUNTER — Ambulatory Visit: Payer: Medicare HMO | Admitting: Family Medicine

## 2018-10-12 ENCOUNTER — Ambulatory Visit (INDEPENDENT_AMBULATORY_CARE_PROVIDER_SITE_OTHER): Payer: Medicare HMO | Admitting: Family Medicine

## 2018-10-12 ENCOUNTER — Encounter: Payer: Self-pay | Admitting: Family Medicine

## 2018-10-12 VITALS — BP 136/76 | HR 96 | Temp 98.2°F | Wt 263.0 lb

## 2018-10-12 DIAGNOSIS — E1159 Type 2 diabetes mellitus with other circulatory complications: Secondary | ICD-10-CM | POA: Diagnosis not present

## 2018-10-12 DIAGNOSIS — J320 Chronic maxillary sinusitis: Secondary | ICD-10-CM | POA: Diagnosis not present

## 2018-10-12 DIAGNOSIS — I10 Essential (primary) hypertension: Secondary | ICD-10-CM

## 2018-10-12 DIAGNOSIS — S91331D Puncture wound without foreign body, right foot, subsequent encounter: Secondary | ICD-10-CM

## 2018-10-12 DIAGNOSIS — E1142 Type 2 diabetes mellitus with diabetic polyneuropathy: Secondary | ICD-10-CM

## 2018-10-12 DIAGNOSIS — I152 Hypertension secondary to endocrine disorders: Secondary | ICD-10-CM

## 2018-10-12 NOTE — Patient Instructions (Addendum)
Thank you for coming in today. For foot wound continue typical treatment.  Keep an eye on it.  If not improving follow up with Jade or myself.   For sinus continue flonase nasal spray.  Consider over the counter allergy medicine like generic Zyrtec (certizine). You can get is quite cheaply on [address].  For diabetes consider re-trying farxiaga. You can try a 1/2 pill first.   Recheck with me as needed.   Follow up with [facility] as scheduled.

## 2018-10-12 NOTE — Progress Notes (Signed)
[name] Rice is a 62 y.o. female who presents to [name]: [name] today for right foot injury.  [name] Rice was seen on December 10 for a variety of issues but had some pain in the plantar aspect of her foot.  During exam she was found to have an unstageable wound on the plantar aspect of her foot.  She was treated with hydrocolloid dressing.  Initially the wound was thought to be a pressure injury however in retrospect patient found a nail stuck through the bottom of her shoe and it was more of a puncture wound.  The interim she notes significant improvement in the wound.  She is now using a simple Band-Aid and notes that it is healing quite nicely.  Additionally she was seen at the last visit for sinusitis.  She had been prescribed prednisone and Augmentin and failed to improve.  She was prescribed azithromycin which helped some.  She continues to experience sinus congestion and pressure.  She wonders if she should see a ENT doctor at some point.  She is using Flonase regularly but is not using an over-the-counter antihistamine regularly.  Lastly upon medicine review she notes that she had discontinued the [place].  She complained of some GI upset.  She is not sure if it was the [place] or other medicines that was prescribed around the same time.  She has not tried to restart it yet.  She is a follow-up appointment scheduled with her primary care provider in about a month.    ROS as above:  Exam:  BP 136/76   Pulse 96   Temp 98.2 F (36.8 C) (Oral)   Wt 263 lb (119.3 kg)   LMP 10/18/1995   BMI 46.59 kg/m  Wt Readings from Last 5 Encounters:  10/12/18 263 lb (119.3 kg)  09/25/18 267 lb (121.1 kg)  09/11/18 261 lb (118.4 kg)  08/10/18 266 lb (120.7 kg)  08/06/18 266 lb (120.7 kg)    Gen: Well NAD HEENT: EOMI,  MMM Lungs: Normal work of breathing. CTABL Heart: RRR no  MRG Abd: NABS, Soft. Nondistended, Nontender Exts: Brisk capillary refill, warm and well perfused.  Right foot wound well-appearing no surrounding erythema or tenderness.  Scab is very thin and mobile.  Lab and Radiology Results No results found for this or any previous visit (from the past 72 hour(s)). No results found.    Assessment and Plan: 63 y.o. female with  Foot wound: More of a puncture wound and a pressure wound.  It is healing nicely.  Plan for continued over-the-counter at home management.  Follow-up with PCP in 1 month as scheduled.  Recheck with PCP or myself or sooner if not improving or if worsening.  Recommend daily wound inspection.  Sinusitis: Chronic symptoms.  Doubtful for bacterial infection at this point.  Reasonable to maximize initial conservative management with Flonase nasal spray and Zyrtec.  Consider montelukast if not improving.  Follow-up with PCP in 1 month.  Possible Farxiga intolerance/diabetes.  Diarrhea or stomach upset is not a common side effect of [place].  I think it was more likely 1 of the other medications that were prescribed around the same time.  Plan for trial of restarting Farxiga.  Reasonable to start with half dose first.  Follow-up with PCP in 1 month.     Historical information moved to improve visibility of documentation.  Past Medical History:  Diagnosis Date  . Anxiety   .  Arthritis   . Asthma    no PFTs  . Cervical dystonia   . COPD (chronic obstructive pulmonary disease) (HCC)    no PFTs  . Depression   . Diabetes mellitus   . Diarrhea   . Diverticulitis   . Diverticulosis   . Dizziness   . Gastroparesis   . GERD (gastroesophageal reflux disease)   . History of colonic polyps    hyperplastic  . History of kidney stones   . History of seborrheic dermatitis    face,ears  . HLD (hyperlipidemia)   . Hypertension   . Intussusception ([place])   . Lisfranc's sprain, left, initial encounter 10/06/2017  . Lumbar pain 2011     per x-ray - Multilevel spondylosis  . Morbid obesity ([place])   . Nephrolithiasis   . OSA on CPAP 2008  . Pneumonia   . Recurrent boils    "SLUSTER BOILS" ON ABD AND LABIA  . Sleep apnea    USES C-PAP  . Torn rotator cuff RT SHOULDER  . Tremor CCENTRAL NERVOUS SYSTEM   Past Surgical History:  Procedure Laterality Date  . CARPAL TUNNEL RELEASE Left 2006  . ELBOW SURGERY Left   . HAND SURGERY Right RIGHT  . KNEE ARTHROSCOPY Left   . LUMBAR LAMINECTOMY    . MASS EXCISION  06/28/2012   Procedure: EXCISION MASS;  Surgeon: Harl Bowie, MD;  Location: [facility];  Service: General;  Laterality: Left;  excision left upper quadrant abdominal wall mass, Benign Lipoma  . NASAL SINUS SURGERY    . PILONIDAL CYST EXCISION  [id number]  . SUBACROMIAL DECOMPRESSION Right   . TUBAL LIGATION     Social History   Tobacco Use  . Smoking status: Former Smoker    Types: Cigarettes    Last attempt to quit: 10/17/1996    Years since quitting: 22.0  . Smokeless tobacco: Never Used  Substance Use Topics  . Alcohol use: No   family history includes Depression in her brother, brother, and sister; Diabetes in her brother, brother, mother, and sister; Heart attack in her brother and brother; Heart disease in her brother; Hyperlipidemia in her brother, brother, mother, and sister; Hypertension in her brother, brother, mother, and sister; Kidney cancer in her brother; Kidney disease in her mother; Stroke in her sister.  Medications: Current Outpatient Medications  Medication Sig Dispense Refill  . acetic acid-hydrocortisone (VOSOL-HC) OTIC solution Place 5 drops into both ears 2 (two) times daily. 10 mL 0  . AMBULATORY NON FORMULARY MEDICATION 4 prong cane DX R06.89 1 each 0  . AMBULATORY NON FORMULARY MEDICATION Diabetic shoes  Dx: Diabetes, uncontrolled 250.60 with neuropathy. 1 Device 0  . AMBULATORY NON FORMULARY MEDICATION Test strips and needles for one touch Verio IQ glucometer.  Use to test blood  sugar up to four times a day. Include all supplies needed including cartridge for lancets.  Diagnosis Type 2 Diabetes 100 each 3  . azelastine (ASTELIN) 0.1 % nasal spray Place 2 sprays into both nostrils 2 (two) times daily. Use in each nostril as directed 30 mL 6  . buPROPion (WELLBUTRIN XL) 150 MG 24 hr tablet TAKE 1 TABLET(150 MG) BY MOUTH DAILY 90 tablet 2  . Capsaicin 0.033 % CREA Apply 1 inch topically 4 (four) times daily. 56.6 g 12  . DULoxetine (CYMBALTA) 60 MG capsule TAKE 2 CAPSULES(120 MG) BY MOUTH DAILY 60 capsule 0  . ferrous sulfate 325 (65 FE) MG EC tablet Take 1 tablet (325 mg total)  by mouth daily with breakfast. 90 tablet 4  . fluticasone (FLONASE) 50 MCG/ACT nasal spray Place 2 sprays into both nostrils daily. 16 g 2  . furosemide (LASIX) 20 MG tablet Take 1 tablet (20 mg total) by mouth 2 (two) times daily. 60 tablet 2  . gabapentin (NEURONTIN) 800 MG tablet Take 1 tablet (800 mg total) by mouth 3 (three) times daily. 90 tablet 5  . HYDROcodone-acetaminophen (NORCO/VICODIN) 5-325 MG tablet TAKE 1 TO 2 TABLETS BY MOUTH three times DAILY as needed 90 tablet 0  . hydrOXYzine (ATARAX/VISTARIL) 50 MG tablet Take 1 tablet (50 mg total) by mouth every 8 (eight) hours as needed. For itching. (Patient taking differently: Take 25 mg by mouth every 8 (eight) hours as needed. For itching.) 90 tablet 2  . Insulin Glargine (LANTUS SOLOSTAR) 100 UNIT/ML Solostar Pen Inject 40 Units into the skin at bedtime. Please include QS 3 months. 36 mL 1  . Insulin Pen Needle (NOVOFINE) 32G X 6 MM MISC USE 3 TIMES A DAY WITH NOVOLOG 100 each 2  . liraglutide (VICTOZA) 18 MG/3ML SOPN ADMINISTER 1.8 MG UNDER THE SKIN DAILY 6 mL 0  . Loratadine (CLARITIN) 10 MG CAPS Take 1 capsule (10 mg total) by mouth daily. 30 each 11  . LORazepam (ATIVAN) 0.5 MG tablet Take 1 tablet (0.5 mg total) by mouth every 8 (eight) hours as needed for anxiety. 20 tablet 0  . meclizine (ANTIVERT) 25 MG tablet Take 1 tablet (25 mg  total) by mouth 3 (three) times daily as needed for dizziness. 30 tablet 0  . meloxicam (MOBIC) 15 MG tablet Take 1 tablet (15 mg total) by mouth as needed for pain. 30 tablet 2  . nystatin (NYSTATIN) powder APPLY EXTERNALLY TO THE AFFECTED AREA THREE TIMES DAILY AS DIRECTED 60 g prn  . nystatin ointment (MYCOSTATIN) apply to affected area twice a day 60 g 1  . olmesartan (BENICAR) 20 MG tablet TAKE 1 TABLET(20 MG) BY MOUTH DAILY 90 tablet 0  . pioglitazone (ACTOS) 30 MG tablet TAKE 1 TABLET BY MOUTH DAILY 90 tablet 0  . propranolol (INDERAL) 20 MG tablet TAKE 1 TABLET(20 MG) BY MOUTH TWICE DAILY 180 tablet 0   No current facility-administered medications for this visit.    Allergies  Allergen Reactions  . Ace Inhibitors Other (See Comments)    Causes blood pressure to drop rapidly  . Atrovent [Ipratropium] Swelling    Throat swelling on Atorvent Nasal Spray  . Cefaclor Hives and Itching  . Glyxambi [Empagliflozin-Linagliptin]     Itching/rash  . Shrimp [Shellfish Allergy] Nausea And Vomiting  . Ceclor [Cefaclor]   . Sulfa Antibiotics   . Sulfonamide Derivatives Rash     Discussed warning signs or symptoms. Please see discharge instructions. Patient expresses understanding.

## 2018-10-18 ENCOUNTER — Other Ambulatory Visit: Payer: Self-pay

## 2018-10-18 DIAGNOSIS — J301 Allergic rhinitis due to pollen: Secondary | ICD-10-CM

## 2018-10-18 NOTE — Telephone Encounter (Signed)
[name] Rice called for a refill on pravastatin and xyzal. Both were discontinued off medication list. She states she still takes both. Please advise.

## 2018-10-19 MED ORDER — LEVOCETIRIZINE DIHYDROCHLORIDE 5 MG PO TABS: 5.0000 mg | ORAL_TABLET | Freq: Every evening | ORAL | 3 refills | Status: DC

## 2018-10-19 MED ORDER — PRAVASTATIN SODIUM 40 MG PO TABS: 40.0000 mg | ORAL_TABLET | Freq: Every day | ORAL | 2 refills | Status: DC

## 2018-10-23 DIAGNOSIS — H2513 Age-related nuclear cataract, bilateral: Secondary | ICD-10-CM | POA: Diagnosis not present

## 2018-10-23 DIAGNOSIS — E119 Type 2 diabetes mellitus without complications: Secondary | ICD-10-CM | POA: Diagnosis not present

## 2018-10-23 LAB — HM DIABETES EYE EXAM

## 2018-10-29 DIAGNOSIS — H2512 Age-related nuclear cataract, left eye: Secondary | ICD-10-CM | POA: Diagnosis not present

## 2018-11-12 ENCOUNTER — Encounter: Payer: Self-pay | Admitting: Physician Assistant

## 2018-11-12 ENCOUNTER — Ambulatory Visit (INDEPENDENT_AMBULATORY_CARE_PROVIDER_SITE_OTHER): Payer: Medicare HMO | Admitting: Physician Assistant

## 2018-11-12 VITALS — BP 101/86 | HR 72 | Temp 97.5°F | Wt 263.0 lb

## 2018-11-12 DIAGNOSIS — L304 Erythema intertrigo: Secondary | ICD-10-CM

## 2018-11-12 DIAGNOSIS — E1142 Type 2 diabetes mellitus with diabetic polyneuropathy: Secondary | ICD-10-CM | POA: Diagnosis not present

## 2018-11-12 LAB — POCT GLYCOSYLATED HEMOGLOBIN (HGB A1C): Hemoglobin A1C: 7.2 % — AB (ref 4.0–5.6)

## 2018-11-12 MED ORDER — NYSTATIN 100000 UNIT/GM EX POWD: Freq: Four times a day (QID) | CUTANEOUS | 0 refills | Status: DC

## 2018-11-12 MED ORDER — FLUCONAZOLE 150 MG PO TABS: ORAL_TABLET | ORAL | 0 refills | Status: DC

## 2018-11-12 MED ORDER — DOXYCYCLINE HYCLATE 100 MG PO TABS: 100.0000 mg | ORAL_TABLET | Freq: Two times a day (BID) | ORAL | 0 refills | Status: DC

## 2018-11-12 NOTE — Patient Instructions (Signed)
Intertrigo  Intertrigo is skin irritation (inflammation) that happens in warm, moist areas of the body. The irritation can cause a rash and make skin raw and itchy. The rash is usually pink or red. It happens mostly between folds of skin or where skin rubs together, such as:   Between the toes.   In the armpits.   In the groin area.   Under the belly.   Under the breasts.   Around the butt area.  This condition is not passed from person to person (is not contagious).  What are the causes?   Heat, moisture, rubbing, and not enough air movement.   The condition can be made worse by:  ? Sweat.  ? Bacteria.  ? A fungus, such as yeast.  What increases the risk?   Moisture in your skin folds.   You are more likely to develop this condition if you:  ? Have diabetes.  ? Are overweight.  ? Are not able to move around.  ? Live in a warm and moist climate.  ? Wear splints, braces, or other medical devices.  ? Are not able to control your pee (urine) or poop (stool).  What are the signs or symptoms?   A pink or red skin rash in the skin fold or near the skin fold.   Raw or scaly skin.   Itching.   A burning feeling.   Bleeding.   Leaking fluid.   A bad smell.  How is this treated?   Cleaning and drying your skin.   Taking an antibiotic medicine or using an antibiotic skin cream for a bacterial infection.   Using an antifungal cream on your skin or taking pills for an infection that was caused by a fungus, such as yeast.   Using a steroid ointment to stop the itching and irritation.   Separating the skin fold with a clean cotton cloth to absorb moisture and allow air to flow into the area.  Follow these instructions at home:   Keep the affected area clean and dry.   Do not scratch your skin.   Stay cool as much as you can. Use an air conditioner or a fan, if you have one.   Apply over-the-counter and prescription medicines only as told by your doctor.   If you were prescribed an antibiotic medicine,  use it as told by your doctor. Do not stop using the antibiotic even if your condition starts to get better.   Keep all follow-up visits as told by your doctor. This is important.  How is this prevented?     Stay at a healthy weight.   Take care of your feet. This is very important if you have diabetes. You should:  ? Wear shoes that fit well.  ? Keep your feet dry.  ? Wear clean cotton or wool socks.   Protect the skin in your groin and butt area as told by your doctor. To do this:  ? Follow a regular cleaning routine.  ? Use creams, powders, or ointments that protect your skin.  ? Change protection pads often.   Do not wear tight clothes. Wear clothes that:  ? Are loose.  ? Take moisture away from your body.  ? Are made of cotton.   Wear a bra that gives good support, if needed.   Shower and dry yourself well after being active. Use a hair dryer on a cool setting to dry between skin folds.   Keep your blood   sugar under control if you have diabetes.  Contact a doctor if:   Your symptoms do not get better with treatment.   Your symptoms get worse or they spread.   You notice more redness and warmth.   You have a fever.  Summary   Intertrigo is skin irritation that occurs when folds of skin rub together.   This condition is caused by heat, moisture, and rubbing.   This condition may be treated by cleaning and drying your skin and with medicines.   Apply over-the-counter and prescription medicines only as told by your doctor.   Keep all follow-up visits as told by your doctor. This is important.  This information is not intended to replace advice given to you by your health care provider. Make sure you discuss any questions you have with your health care provider.  Document Released: 11/05/2010 Document Revised: 03/05/2018 Document Reviewed: 04/06/2015  [employer] Interactive Patient Education  2019 [employer]

## 2018-11-12 NOTE — Progress Notes (Signed)
Subjective:    Patient ID: [name] Rice, female    DOB: 01-01-1956, 63 y.o.   MRN: [id number]  HPI Pt is a 63 yo obese female with T2DM, COPD, OSA who presents to the clinic for 3 month follow up.  She is not checking her sugars. She admits her diet is not very good. She also admits she is not taking her medications as directed.   She is most concerned the rash under her abdomen fold and groin area. It is red and oozing. No fever, chills, nausea, vomiting. She has hx of intertrigo that has been infected at times. She is using nystatin cream with no benefit. It is beginging to be really painful.      .. Active Ambulatory Problems    Diagnosis Date Noted  . Type II or unspecified type diabetes mellitus with neurological manifestations, not stated as uncontrolled(250.60) 08/08/2006  . DYSLIPIDEMIA 08/08/2006  . Depression with anxiety 01/28/2008  . Hypertension associated with diabetes ([place]) 08/08/2006  . GERD 08/08/2006  . Acute bilateral low back pain 11/27/2007  . BPPV (benign paroxysmal positional vertigo), unspecified laterality 07/14/2009  . OSA on CPAP   . Insomnia 02/06/2011  . Preventative health care 08/16/2011  . History of allergic rhinitis 09/07/2011  . Iron deficiency anemia 03/29/2012  . Lipoma of abdominal wall 05/01/2012  . Intestinal bacterial overgrowth 09/20/2012  . SOB (shortness of breath) on exertion 03/31/2013  . Hepatic steatosis 09/26/2013  . Diverticulitis 11/27/2013  . Diabetic peripheral neuropathy ([place]) 05/30/2014  . Gastroparesis due to secondary diabetes ([place]) 09/16/2014  . Ear pain 12/09/2014  . Diarrhea 01/12/2015  . Non-proliferative diabetic retinopathy, right eye ([place]) 11/23/2015  . Chronic pain syndrome 12/15/2015  . Morbid obesity due to excess calories ([place]) 02/16/2016  . Right shoulder pain 02/16/2016  . Unstable balance 02/16/2016  . Intertrigo 03/15/2016  . Left knee pain 03/15/2016  . Post-menopausal 03/16/2016  . Acute  reaction to stress 03/28/2016  . Dystonia 09/21/2016  . Neck pain 07/11/2017  . Lisfranc's dislocation, right, initial encounter 10/06/2017  . Freiberg's disease, right 10/06/2017  . Vasomotor rhinitis 11/28/2017  . Ear itching 01/17/2018  . No energy 01/17/2018  . Hordeolum externum of right lower eyelid 02/16/2018  . Hot flashes 02/16/2018  . Restrictive lung disease 04/12/2018  . Mixed hyperlipidemia 04/12/2018  . Vitamin D insufficiency 04/12/2018  . Drug-induced constipation 05/23/2018  . Calcification and ossification of muscle 07/13/2018  . Pressure injury of right foot, unstageable ([place]) 09/25/2018   Resolved Ambulatory Problems    Diagnosis Date Noted  . IRON DEFICIENCY 01/30/2007  . DEPRESSION 08/08/2006  . TORTICOLLIS, SPASMODIC 02/13/2007  . SINUSITIS, ACUTE 05/27/2009  . ALLERGIC RHINITIS 12/25/2006  . ASTHMA 01/28/2008  . COPD 08/08/2006  . DIVERTICULOSIS, COLON 02/12/2003  . DERMATITIS, SEBORRHEIC 01/30/2007  . GOUT, HX OF 08/08/2006  . PERSONAL HX COLONIC POLYPS 09/16/2008  . NEPHROLITHIASIS, HX OF 01/28/2008  . Calf pain 11/29/2010  . Tremor, essential 02/06/2011  . Seborrheic dermatitis, unspecified 04/06/2011  . Finger pain 04/06/2011  . Shoulder pain, right 08/16/2011  . Left leg pain 04/10/2012  . Right ear pain 05/01/2012  . Seborrheic dermatitis 05/01/2012  . Hypoglycemia 05/01/2012  . Vaginal candidiasis 08/22/2012  . Sigmoid diverticulitis 09/15/2012  . Acute-on-chronic kidney disease Stage II 09/15/2012  . Cyst, breast 03/01/2013  . Lactic acidosis 03/31/2013  . Acute renal insufficiency 03/31/2013  . Abdominal pain, left lower quadrant 04/29/2013  . Gout 05/01/2013  . COPD (chronic obstructive  pulmonary disease) with chronic bronchitis ([place]) 12/09/2014  . Type 2 diabetes, controlled, with peripheral neuropathy ([place]) 12/09/2014  . Fall 03/16/2016  . Foot fracture, right, closed, initial encounter 08/23/2017   Past Medical History:   Diagnosis Date  . Anxiety   . Arthritis   . Asthma   . Cervical dystonia   . COPD (chronic obstructive pulmonary disease) ([place])   . Depression   . Diabetes mellitus   . Diverticulosis   . Dizziness   . Gastroparesis   . GERD (gastroesophageal reflux disease)   . History of colonic polyps   . History of kidney stones   . History of seborrheic dermatitis   . HLD (hyperlipidemia)   . Hypertension   . Intussusception ([place])   . Lisfranc's sprain, left, initial encounter 10/06/2017  . Lumbar pain 2011  . Morbid obesity ([place])   . Nephrolithiasis   . Pneumonia   . Recurrent boils   . Sleep apnea   . Torn rotator cuff RT SHOULDER  . Tremor [place]      Review of Systems  All other systems reviewed and are negative.      Objective:   Physical Exam Vitals signs reviewed.  Constitutional:      Appearance: Normal appearance. She is obese.  HENT:     Head: Normocephalic and atraumatic.  Cardiovascular:     Rate and Rhythm: Normal rate and regular rhythm.  Pulmonary:     Effort: Pulmonary effort is normal.     Breath sounds: Normal breath sounds.  Skin:    Comments: Erythematous macular rash under abdominal folds and groin/intertrigo spaces with slightly macerated appearance and satellite lesions.   Neurological:     General: No focal deficit present.     Mental Status: She is alert and oriented to person, place, and time.  Psychiatric:        Mood and Affect: Mood normal.        Behavior: Behavior normal.           Assessment & Plan:  Marland KitchenMarland KitchenDiagnoses and all orders for this visit:  Intertrigo -     doxycycline (VIBRA-TABS) 100 MG tablet; Take 1 tablet (100 mg total) by mouth 2 (two) times daily. For 10 days. -     fluconazole (DIFLUCAN) 150 MG tablet; Take once weekly for 4 weeks. -     nystatin (MYCOSTATIN/NYSTOP) powder; Apply topically 4 (four) times daily.  Diabetic peripheral neuropathy (HCC)  Type 2 diabetes, controlled, with peripheral  neuropathy (HCC) -     POCT HgB A1C     .Marland Kitchen Lab Results  Component Value Date   HGBA1C 7.2 (A) 11/12/2018   7.2 A!C is up from 7.0 at last visit. Pt admits to not taking her medications as directed.  Encouraged to start taking medications as directed.  On ARB. BP to goal.  On Statin. LDL 73 at last check 03/2018.  Recent eye exam. Will call to get records.  Vaccines up to date.  Needs foot exam. Left before done.  Follow up in 3 months.   Appears like she has yeast with bacterial staph infection.  Diflucan and doxycycline. Continue to use nystatin powder.  Keep area dry.  Follow up as needed.   Follow up in 3 months.

## 2018-11-14 ENCOUNTER — Encounter: Payer: Self-pay | Admitting: Physician Assistant

## 2018-11-14 DIAGNOSIS — G4733 Obstructive sleep apnea (adult) (pediatric): Secondary | ICD-10-CM | POA: Diagnosis not present

## 2018-11-15 ENCOUNTER — Encounter: Payer: Self-pay | Admitting: Physician Assistant

## 2018-11-20 ENCOUNTER — Other Ambulatory Visit: Payer: Self-pay | Admitting: Physician Assistant

## 2018-11-20 DIAGNOSIS — E1142 Type 2 diabetes mellitus with diabetic polyneuropathy: Secondary | ICD-10-CM

## 2018-11-29 ENCOUNTER — Other Ambulatory Visit: Payer: Self-pay | Admitting: Physician Assistant

## 2018-11-29 DIAGNOSIS — I10 Essential (primary) hypertension: Secondary | ICD-10-CM

## 2018-12-03 ENCOUNTER — Other Ambulatory Visit: Payer: Self-pay | Admitting: Physician Assistant

## 2018-12-03 DIAGNOSIS — I10 Essential (primary) hypertension: Secondary | ICD-10-CM

## 2018-12-04 DIAGNOSIS — H2511 Age-related nuclear cataract, right eye: Secondary | ICD-10-CM | POA: Diagnosis not present

## 2018-12-06 ENCOUNTER — Other Ambulatory Visit: Payer: Self-pay | Admitting: Physician Assistant

## 2018-12-06 DIAGNOSIS — E1142 Type 2 diabetes mellitus with diabetic polyneuropathy: Secondary | ICD-10-CM

## 2018-12-10 DIAGNOSIS — H2511 Age-related nuclear cataract, right eye: Secondary | ICD-10-CM | POA: Diagnosis not present

## 2018-12-13 ENCOUNTER — Ambulatory Visit (INDEPENDENT_AMBULATORY_CARE_PROVIDER_SITE_OTHER): Payer: Medicare HMO | Admitting: Family Medicine

## 2018-12-13 VITALS — BP 127/64 | HR 76 | Wt 270.0 lb

## 2018-12-13 DIAGNOSIS — S39012A Strain of muscle, fascia and tendon of lower back, initial encounter: Secondary | ICD-10-CM

## 2018-12-13 DIAGNOSIS — R29898 Other symptoms and signs involving the musculoskeletal system: Secondary | ICD-10-CM | POA: Diagnosis not present

## 2018-12-13 DIAGNOSIS — Z794 Long term (current) use of insulin: Secondary | ICD-10-CM | POA: Diagnosis not present

## 2018-12-13 DIAGNOSIS — I10 Essential (primary) hypertension: Secondary | ICD-10-CM | POA: Diagnosis not present

## 2018-12-13 DIAGNOSIS — M7062 Trochanteric bursitis, left hip: Secondary | ICD-10-CM

## 2018-12-13 DIAGNOSIS — E118 Type 2 diabetes mellitus with unspecified complications: Secondary | ICD-10-CM

## 2018-12-13 MED ORDER — OLMESARTAN MEDOXOMIL 20 MG PO TABS: 10.0000 mg | ORAL_TABLET | Freq: Every day | ORAL | 0 refills | Status: DC

## 2018-12-13 NOTE — Progress Notes (Signed)
[name] Rice is a 63 y.o. female who presents to [facility] today for left low back pain and left lateral hip pain.  Patient has mild chronic low back pain thought to be due to degenerative disc disease lumbar spine.  She was doing reasonably well until but 2 weeks ago when she developed significant pain in her low back on the side.  She denies any injury that preceded the pain.  She notes that the pain is worse with prolonged sitting.  Occasionally with prolonged sitting she will have some numbness going down the posterior aspect of her left leg.  Additionally with prolonged standing she notes pain in her low back as well.  Additionally she has some pain in the left lateral hip with standing.  She notes considerable pain when standing from a seated position.  She is having difficulty with transitions at times as well.    ROS:  As above  Exam:  BP 127/64   Pulse 76   Wt 270 lb (122.5 kg)   LMP 10/18/1995   BMI 47.83 kg/m  Wt Readings from Last 5 Encounters:  12/13/18 270 lb (122.5 kg)  11/12/18 263 lb (119.3 kg)  10/12/18 263 lb (119.3 kg)  09/25/18 267 lb (121.1 kg)  09/11/18 261 lb (118.4 kg)   General: Well Developed, well nourished, and in no acute distress.  Morbidly obese Neuro/Psych: Alert and oriented x3, extra-ocular muscles intact, able to move all 4 extremities, sensation grossly intact. Skin: Warm and dry, no rashes noted.  Respiratory: Not using accessory muscles, speaking in full sentences, trachea midline.  Cardiovascular: Pulses palpable, no extremity edema. Abdomen: Does not appear distended. MSK: L-spine: Nontender to spinal midline.  Tender palpation left lumbar paraspinal musculature. Decreased lumbar motion due to pain. Left hip appearing normal motion.  Tender palpation greater trochanter and along hip abductors in buttock. Antalgic gait present.  Decreased hip strength to hip abduction.    Lab and Radiology  Results Hip greater trochanteric injection: left Consent obtained and timeout performed. Area of maximum tenderness palpated and identified. Skin cleaned with alcohol, cold spray applied. A spinal needle was used to access the greater trochanteric bursa. 80 mg of Depo-Medrol, and 4 mL of Marcaine were used to inject the trochanteric bursa. Patient tolerated the procedure well.   EXAM: LUMBAR SPINE - COMPLETE 4+ VIEW  COMPARISON:  03/31/2014 CT scan 05/19/2010 lumbar spine x-ray  FINDINGS: Five views of the lumbar spine submitted. No acute fracture or subluxation. Again noted disc space flattening at L1 L2 level. Anterior bridging osteophytes noted at L2-L3 level. Mild anterior spurring upper endplate of L4 and L5 vertebral body. Moderate disc space flattening at L5-S1 level. Facet degenerative changes L4 and L5 level.  IMPRESSION: No acute fracture or subluxation. Multilevel degenerative changes as described above.   Electronically Signed   By: Lahoma Crocker M.D.   On: 08/12/2015 14:26 I personally (independently) visualized and performed the interpretation of the images attached in this note.   Assessment and Plan: 63 y.o. female with  Acute low back pain in the setting of chronic low back pain and acute lateral hip pain.  Very likely lumbosacral strain and hip abductor tendinopathy.  Plan for greater trochanter injection as above.  Additionally we will proceed with home exercise program and home health physical therapy.  Recheck in 1 month.  Return sooner if needed.   PDMP not reviewed this encounter. Orders Placed This Encounter  Procedures  .  Ambulatory referral to Home Health    Referral Priority:   Routine    Referral Type:   Home Health Care    Referral Reason:   Specialty Services Required    Requested Specialty:   [facility]    Number of Visits Requested:   1   Meds ordered this encounter  Medications  . olmesartan (BENICAR) 20 MG tablet     Sig: Take 0.5 tablets (10 mg total) by mouth daily.    Dispense:  1 tablet    Refill:  0    Historical information moved to improve visibility of documentation.  Past Medical History:  Diagnosis Date  . Anxiety   . Arthritis   . Asthma    no PFTs  . Cervical dystonia   . COPD (chronic obstructive pulmonary disease) (HCC)    no PFTs  . Depression   . Diabetes mellitus   . Diarrhea   . Diverticulitis   . Diverticulosis   . Dizziness   . Gastroparesis   . GERD (gastroesophageal reflux disease)   . History of colonic polyps    hyperplastic  . History of kidney stones   . History of seborrheic dermatitis    face,ears  . HLD (hyperlipidemia)   . Hypertension   . Intussusception ([place])   . Lisfranc's sprain, left, initial encounter 10/06/2017  . Lumbar pain 2011   per x-ray - Multilevel spondylosis  . Morbid obesity ([place])   . Nephrolithiasis   . OSA on CPAP 2008  . Pneumonia   . Recurrent boils    "SLUSTER BOILS" ON ABD AND LABIA  . Sleep apnea    USES C-PAP  . Torn rotator cuff RT SHOULDER  . Tremor CCENTRAL NERVOUS SYSTEM   Past Surgical History:  Procedure Laterality Date  . CARPAL TUNNEL RELEASE Left 2006  . ELBOW SURGERY Left   . HAND SURGERY Right RIGHT  . KNEE ARTHROSCOPY Left   . LUMBAR LAMINECTOMY    . MASS EXCISION  06/28/2012   Procedure: EXCISION MASS;  Surgeon: Harl Bowie, MD;  Location: [facility];  Service: General;  Laterality: Left;  excision left upper quadrant abdominal wall mass, Benign Lipoma  . NASAL SINUS SURGERY    . PILONIDAL CYST EXCISION  [id number]  . SUBACROMIAL DECOMPRESSION Right   . TUBAL LIGATION     Social History   Tobacco Use  . Smoking status: Former Smoker    Types: Cigarettes    Last attempt to quit: 10/17/1996    Years since quitting: 22.1  . Smokeless tobacco: Never Used  Substance Use Topics  . Alcohol use: No   family history includes Depression in her brother, brother, and sister; Diabetes in her brother,  brother, mother, and sister; Heart attack in her brother and brother; Heart disease in her brother; Hyperlipidemia in her brother, brother, mother, and sister; Hypertension in her brother, brother, mother, and sister; Kidney cancer in her brother; Kidney disease in her mother; Stroke in her sister.  Medications: Current Outpatient Medications  Medication Sig Dispense Refill  . acetic acid-hydrocortisone (VOSOL-HC) OTIC solution Place 5 drops into both ears 2 (two) times daily. 10 mL 0  . AMBULATORY NON FORMULARY MEDICATION 4 prong cane DX R06.89 1 each 0  . AMBULATORY NON FORMULARY MEDICATION Diabetic shoes  Dx: Diabetes, uncontrolled 250.60 with neuropathy. 1 Device 0  . AMBULATORY NON FORMULARY MEDICATION Test strips and needles for one touch Verio IQ glucometer.  Use to test blood sugar up to  four times a day. Include all supplies needed including cartridge for lancets.  Diagnosis Type 2 Diabetes 100 each 3  . azelastine (ASTELIN) 0.1 % nasal spray Place 2 sprays into both nostrils 2 (two) times daily. Use in each nostril as directed 30 mL 6  . buPROPion (WELLBUTRIN XL) 150 MG 24 hr tablet TAKE 1 TABLET(150 MG) BY MOUTH DAILY 90 tablet 2  . Capsaicin 0.033 % CREA Apply 1 inch topically 4 (four) times daily. 56.6 g 12  . doxycycline (VIBRA-TABS) 100 MG tablet Take 1 tablet (100 mg total) by mouth 2 (two) times daily. For 10 days. 20 tablet 0  . DULoxetine (CYMBALTA) 60 MG capsule TAKE 2 CAPSULES(120 MG) BY MOUTH DAILY 60 capsule 0  . FARXIGA 10 MG TABS tablet TAKE 1 TABLET BY MOUTH DAILY 30 tablet 2  . ferrous sulfate 325 (65 FE) MG EC tablet Take 1 tablet (325 mg total) by mouth daily with breakfast. 90 tablet 4  . fluconazole (DIFLUCAN) 150 MG tablet Take once weekly for 4 weeks. 4 tablet 0  . fluticasone (FLONASE) 50 MCG/ACT nasal spray Place 2 sprays into both nostrils daily. 16 g 2  . furosemide (LASIX) 20 MG tablet Take 1 tablet (20 mg total) by mouth 2 (two) times daily. 60 tablet 2  .  gabapentin (NEURONTIN) 800 MG tablet Take 1 tablet (800 mg total) by mouth 3 (three) times daily. 90 tablet 5  . HYDROcodone-acetaminophen (NORCO/VICODIN) 5-325 MG tablet TAKE 1 TO 2 TABLETS BY MOUTH three times DAILY as needed 90 tablet 0  . hydrOXYzine (ATARAX/VISTARIL) 50 MG tablet Take 1 tablet (50 mg total) by mouth every 8 (eight) hours as needed. For itching. (Patient taking differently: Take 25 mg by mouth every 8 (eight) hours as needed. For itching.) 90 tablet 2  . Insulin Pen Needle (NOVOFINE) 32G X 6 MM MISC USE 3 TIMES A DAY WITH NOVOLOG 100 each 2  . levocetirizine (XYZAL) 5 MG tablet Take 1 tablet (5 mg total) by mouth every evening. 90 tablet 3  . liraglutide (VICTOZA) 18 MG/3ML SOPN ADMINISTER 1.8 MG UNDER THE SKIN DAILY 6 mL 0  . Loratadine (CLARITIN) 10 MG CAPS Take 1 capsule (10 mg total) by mouth daily. 30 each 11  . LORazepam (ATIVAN) 0.5 MG tablet Take 1 tablet (0.5 mg total) by mouth every 8 (eight) hours as needed for anxiety. 20 tablet 0  . meclizine (ANTIVERT) 25 MG tablet Take 1 tablet (25 mg total) by mouth 3 (three) times daily as needed for dizziness. 30 tablet 0  . meloxicam (MOBIC) 15 MG tablet Take 1 tablet (15 mg total) by mouth as needed for pain. 30 tablet 2  . nystatin (MYCOSTATIN/NYSTOP) powder Apply topically 4 (four) times daily. 60 g 0  . nystatin (NYSTATIN) powder APPLY EXTERNALLY TO THE AFFECTED AREA THREE TIMES DAILY AS DIRECTED 60 g prn  . nystatin ointment (MYCOSTATIN) apply to affected area twice a day 60 g 1  . olmesartan (BENICAR) 20 MG tablet Take 0.5 tablets (10 mg total) by mouth daily. 1 tablet 0  . pioglitazone (ACTOS) 30 MG tablet TAKE 1 TABLET BY MOUTH DAILY 90 tablet 0  . pravastatin (PRAVACHOL) 10 MG tablet Take 10 mg by mouth daily.    . propranolol (INDERAL) 20 MG tablet TAKE 1 TABLET BY MOUTH TWICE DAILY. 180 tablet 0  . Insulin Glargine (LANTUS SOLOSTAR) 100 UNIT/ML Solostar Pen Inject 40 Units into the skin at bedtime. Please include QS  3 months. [place]  mL 1   No current facility-administered medications for this visit.    Allergies  Allergen Reactions  . Ace Inhibitors Other (See Comments)    Causes blood pressure to drop rapidly  . Atrovent [Ipratropium] Swelling    Throat swelling on Atorvent Nasal Spray  . Cefaclor Hives and Itching  . Glyxambi [Empagliflozin-Linagliptin]     Itching/rash  . Shrimp [Shellfish Allergy] Nausea And Vomiting  . Ceclor [Cefaclor]   . Sulfa Antibiotics   . Sulfonamide Derivatives Rash      Discussed warning signs or symptoms. Please see discharge instructions. Patient expresses understanding.

## 2018-12-13 NOTE — Patient Instructions (Signed)
Thank you for coming in today. Call or go to the ER if you develop a large red swollen joint with extreme pain or oozing puss.  Attend PT.  Recheck in 1 month.  If not improved let me know ahead of time and I will put an order in for xray of the low back and pelvis ahead of time.  You can arrive 30 - 60 mins early and get xray.   Trochanteric Bursitis Trochanteric bursitis is a condition that causes hip pain. Trochanteric bursitis happens when fluid-filled sacs (bursae) in the hip get irritated. Normally these sacs absorb shock and help strong bands of tissue (tendons) in your hip glide smoothly over each other and over your hip bones. What are the causes? This condition results from increased friction between the hip bones and the tendons that go over them. This condition can happen if you:  Have weak hips.  Use your hip muscles too much (overuse).  Get hit in the hip. What increases the risk? This condition is more likely to develop in:  Women.  Adults who are middle-aged or older.  People with arthritis or a spinal condition.  People with weak buttocks muscles (gluteal muscles).  People who have one leg that is shorter than the other.  People who participate in certain kinds of athletic activities, such as: ? Running sports, especially long-distance running. ? Contact sports, like football or martial arts. ? Sports in which falls may occur, like skiing. What are the signs or symptoms? The main symptom of this condition is pain and tenderness over the point of your hip. The pain may be:  Sharp and intense.  Dull and achy.  Felt on the outside of your thigh. It may increase when you:  Lie on your side.  Walk or run.  Go up on stairs.  Sit.  Stand up after sitting.  Stand for long periods of time. How is this diagnosed? This condition may be diagnosed based on:  Your symptoms.  Your medical history.  A physical exam.  Imaging tests, such as: ? X-rays  to check your bones. ? An MRI or ultrasound to check your tendons and muscles. During your physical exam, your health care provider will check the movement and strength of your hip. He or she may press on the point of your hip to check for pain. How is this treated? This condition may be treated by:  Resting.  Reducing your activity.  Avoiding activities that cause pain.  Using crutches, a cane, or a walker to decrease the strain on your hip.  Taking medicine to help with swelling.  Having medicine injected into the bursae to help with swelling.  Using ice, heat, and massage therapy for pain relief.  Physical therapy exercises for strength and flexibility.  Surgery (rare). Follow these instructions at home: Activity  Rest.  Avoid activities that cause pain.  Return to your normal activities as told by your health care provider. Ask your health care provider what activities are safe for you. Managing pain, stiffness, and swelling  Take over-the-counter and prescription medicines only as told by your health care provider.  If directed, apply heat to the injured area as told by your health care provider. ? Place a towel between your skin and the heat source. ? Leave the heat on for 20-30 minutes. ? Remove the heat if your skin turns bright red. This is especially important if you are unable to feel pain, heat, or cold. You may have  a greater risk of getting burned.  If directed, apply ice to the injured area: ? Put ice in a plastic bag. ? Place a towel between your skin and the bag. ? Leave the ice on for 20 minutes, 2-3 times a day. General instructions  If the affected leg is one that you use for driving, ask your health care provider when it is safe to drive.  Use crutches, a cane, or a walker as told by your health care provider.  If one of your legs is shorter than the other, get fitted for a shoe insert.  Lose weight if you are overweight. How is this  prevented?  Wear supportive footwear that is appropriate for your sport.  If you have hip pain, start any new exercise or sport slowly.  Maintain physical fitness, including: ? Strength. ? Flexibility. Contact a health care provider if:  Your pain does not improve with 2-4 weeks. Get help right away if:  You develop severe pain.  You have a fever.  You develop increased redness over your hip.  You have a change in your bowel function or bladder function.  You cannot control the muscles in your feet. This information is not intended to replace advice given to you by your health care provider. Make sure you discuss any questions you have with your health care provider. Document Released: 11/10/2004 Document Revised: 06/08/2016 Document Reviewed: 09/18/2015 Elsevier Interactive Patient Education  2019 [employer]    Lumbosacral Strain Lumbosacral strain is an injury that causes pain in the lower back (lumbosacral spine). This injury usually occurs from overstretching the muscles or ligaments along your spine. A strain can affect one or more muscles or cord-like tissues that connect bones to other bones (ligaments). What are the causes? This condition may be caused by:  A hard, direct hit (blow) to the back.  Excessive stretching of the lower back muscles. This may result from: ? A fall. ? Lifting something heavy. ? Repetitive movements such as bending or crouching. What increases the risk? The following factors may increase your risk of getting this condition:  Participating in sports or activities that involve: ? A sudden twist of the back. ? Pushing or pulling motions.  Being overweight or obese.  Having poor strength and flexibility, especially tight hamstrings or weak muscles in the back or abdomen.  Having too much of a curve in the lower back.  Having a pelvis that is tilted forward. What are the signs or symptoms? The main symptom of this condition is  pain in the lower back, at the site of the strain. Pain may extend (radiate) down one or both legs. How is this diagnosed? This condition is diagnosed based on:  Your symptoms.  Your medical history.  A physical exam. ? Your health care provider may push on certain areas of your back to determine the source of your pain. ? You may be asked to bend forward, backward, and side to side to assess the severity of your pain and your range of motion.  Imaging tests, such as: ? X-rays. ? MRI.  How is this treated? Treatment for this condition may include:  Putting heat and cold on the affected area.  Medicines to help relieve pain and relax your muscles (muscle relaxants).  NSAIDs to help reduce swelling and discomfort. When your symptoms improve, it is important to gradually return to your normal routine as soon as possible to reduce pain, avoid stiffness, and avoid loss of muscle strength.  Generally, symptoms should improve within 6 weeks of treatment. However, recovery time varies. Follow these instructions at home: Managing pain, stiffness, and swelling   If directed, put ice on the injured area during the first 24 hours after your strain. ? Put ice in a plastic bag. ? Place a towel between your skin and the bag. ? Leave the ice on for 20 minutes, 2-3 times a day.  If directed, put heat on the affected area as often as told by your health care provider. Use the heat source that your health care provider recommends, such as a moist heat pack or a heating pad. ? Place a towel between your skin and the heat source. ? Leave the heat on for 20-30 minutes. ? Remove the heat if your skin turns bright red. This is especially important if you are unable to feel pain, heat, or cold. You may have a greater risk of getting burned. Activity  Rest and return to your normal activities as told by your health care provider. Ask your health care provider what activities are safe for you.  Avoid  activities that take a lot of energy for as long as told by your health care provider. General instructions  Take over-the-counter and prescription medicines only as told by your health care provider.  Donot drive or use heavy machinery while taking prescription pain medicine.  Do not use any products that contain nicotine or tobacco, such as cigarettes and e-cigarettes. If you need help quitting, ask your health care provider.  Keep all follow-up visits as told by your health care provider. This is important. How is this prevented?  Use correct form when playing sports and lifting heavy objects.  Use good posture when sitting and standing.  Maintain a healthy weight.  Sleep on a mattress with medium firmness to support your back.  Be safe and responsible while being active to avoid falls.  Do at least 150 minutes of moderate-intensity exercise each week, such as brisk walking or water aerobics. Try a form of exercise that takes stress off your back, such as swimming or stationary cycling.  Maintain physical fitness, including: ? Strength. ? Flexibility. ? Cardiovascular fitness. ? Endurance. Contact a health care provider if:  Your back pain does not improve after 6 weeks of treatment.  Your symptoms get worse. Get help right away if:  Your back pain is severe.  You cannot stand or walk.  You have difficulty controlling when you urinate or when you have a bowel movement.  You feel nauseous or you vomit.  Your feet get very cold.  You have numbness, tingling, weakness, or problems using your arms or legs.  You develop any of the following: ? Shortness of breath. ? Dizziness. ? Pain in your legs. ? Weakness in your buttocks or legs. ? Discoloration of the skin on your toes or legs. This information is not intended to replace advice given to you by your health care provider. Make sure you discuss any questions you have with your health care provider. Document  Released: 07/13/2005 Document Revised: 04/22/2016 Document Reviewed: 03/06/2016 Elsevier Interactive Patient Education  2019 [employer].

## 2019-01-07 ENCOUNTER — Telehealth: Payer: Self-pay | Admitting: Physician Assistant

## 2019-01-07 NOTE — Telephone Encounter (Signed)
Left voicemail with information below and to call us back if there is any further questions.

## 2019-01-07 NOTE — Telephone Encounter (Signed)
Patient states that she has had a knot come up on the left wrist and knots on right foot. Would like to know if she can come in on 01/11/19 with you to have that looked at instead of phone visit. Please advise.

## 2019-01-07 NOTE — Telephone Encounter (Signed)
I would recommend delaying an office visit if possible.  Delayed for 1 month.  Let me know if you need to have some sort of evaluation sooner.

## 2019-01-10 ENCOUNTER — Ambulatory Visit: Payer: Medicare HMO | Admitting: Family Medicine

## 2019-01-11 ENCOUNTER — Other Ambulatory Visit: Payer: Self-pay

## 2019-01-11 ENCOUNTER — Ambulatory Visit: Payer: Medicare HMO | Admitting: Family Medicine

## 2019-01-18 ENCOUNTER — Other Ambulatory Visit: Payer: Self-pay | Admitting: Physician Assistant

## 2019-01-18 DIAGNOSIS — Z794 Long term (current) use of insulin: Secondary | ICD-10-CM

## 2019-01-18 DIAGNOSIS — E1142 Type 2 diabetes mellitus with diabetic polyneuropathy: Secondary | ICD-10-CM

## 2019-01-18 DIAGNOSIS — E118 Type 2 diabetes mellitus with unspecified complications: Secondary | ICD-10-CM

## 2019-01-21 ENCOUNTER — Other Ambulatory Visit: Payer: Self-pay | Admitting: Physician Assistant

## 2019-01-21 DIAGNOSIS — E118 Type 2 diabetes mellitus with unspecified complications: Secondary | ICD-10-CM

## 2019-01-21 DIAGNOSIS — Z794 Long term (current) use of insulin: Secondary | ICD-10-CM

## 2019-01-21 DIAGNOSIS — E1142 Type 2 diabetes mellitus with diabetic polyneuropathy: Secondary | ICD-10-CM

## 2019-01-24 ENCOUNTER — Ambulatory Visit (INDEPENDENT_AMBULATORY_CARE_PROVIDER_SITE_OTHER): Payer: Medicare HMO | Admitting: Family Medicine

## 2019-01-24 ENCOUNTER — Other Ambulatory Visit: Payer: Self-pay

## 2019-01-24 ENCOUNTER — Encounter: Payer: Self-pay | Admitting: Family Medicine

## 2019-01-24 ENCOUNTER — Ambulatory Visit (INDEPENDENT_AMBULATORY_CARE_PROVIDER_SITE_OTHER): Payer: Medicare HMO

## 2019-01-24 VITALS — BP 100/53 | HR 78 | Wt 274.0 lb

## 2019-01-24 DIAGNOSIS — J301 Allergic rhinitis due to pollen: Secondary | ICD-10-CM | POA: Diagnosis not present

## 2019-01-24 DIAGNOSIS — R239 Unspecified skin changes: Secondary | ICD-10-CM

## 2019-01-24 DIAGNOSIS — T84038A Mechanical loosening of other internal prosthetic joint, initial encounter: Secondary | ICD-10-CM | POA: Diagnosis not present

## 2019-01-24 DIAGNOSIS — Z967 Presence of other bone and tendon implants: Secondary | ICD-10-CM

## 2019-01-24 DIAGNOSIS — T783XXA Angioneurotic edema, initial encounter: Secondary | ICD-10-CM | POA: Diagnosis not present

## 2019-01-24 DIAGNOSIS — G5603 Carpal tunnel syndrome, bilateral upper limbs: Secondary | ICD-10-CM | POA: Diagnosis not present

## 2019-01-24 MED ORDER — PREDNISONE 5 MG (48) PO TBPK: ORAL_TABLET | ORAL | 0 refills | Status: DC

## 2019-01-24 MED ORDER — FAMOTIDINE 20 MG PO TABS: 20.0000 mg | ORAL_TABLET | Freq: Two times a day (BID) | ORAL | 3 refills | Status: DC

## 2019-01-24 MED ORDER — LEVOCETIRIZINE DIHYDROCHLORIDE 5 MG PO TABS: 5.0000 mg | ORAL_TABLET | Freq: Two times a day (BID) | ORAL | 3 refills | Status: DC

## 2019-01-24 NOTE — Patient Instructions (Addendum)
Thank you for coming in today.  Continue xyzal and try twice dialy.  If insurance will not cover twice daily then take zyrtec once daily and xyzal 12 hours later.   STOP olmesartan blood pressure.   Take tapering course of prednisone.   Take famotidine (papcid) twice daily for histamine blocking.   Keep me updated.   Lets plan on a video visit recheck in 1 week.     Angioedema  Angioedema is sudden swelling in the body. The swelling can happen in any part of the body. It often happens on the skin and causes itchy, bumpy patches (hives) to form. This condition may:  Happen only one time.  Happen more than one time. It may come back at random times.  Keep coming back for a number of years. Someday it may stop coming back. Follow these instructions at home:  Take over-the-counter and prescription medicines only as told by your doctor.  If you were given medicines for emergency allergy treatment, always carry them with you.  Wear a medical bracelet as told by your doctor.  Avoid the things that cause your attacks (triggers).  If this condition was passed to you from your parents and you want to have kids, talk to your doctor. Your kids may also have this condition. Contact a doctor if:  You have another attack.  Your attacks happen more often, even after you take steps to prevent them.  This condition was passed to you by your parents and you want to have kids. Get help right away if:  Your mouth, tongue, or lips get very swollen.  You have trouble breathing.  You have trouble swallowing.  You pass out (faint). This information is not intended to replace advice given to you by your health care provider. Make sure you discuss any questions you have with your health care provider. Document Released: 09/21/2009 Document Revised: 05/04/2016 Document Reviewed: 04/12/2016 Elsevier Interactive Patient Education  2019 [place] Syndrome  Carpal  tunnel syndrome is a condition that causes pain in your hand and arm. The carpal tunnel is a narrow area that is on the palm side of your wrist. Repeated wrist motion or certain diseases may cause swelling in the tunnel. This swelling can pinch the main nerve in the wrist (median nerve). What are the causes? This condition may be caused by:  Repeated wrist motions.  Wrist injuries.  Arthritis.  A sac of fluid (cyst) or abnormal growth (tumor) in the carpal tunnel.  Fluid buildup during pregnancy. Sometimes the cause is not known. What increases the risk? The following factors may make you more likely to develop this condition:  Having a job in which you move your wrist in the same way many times. This includes jobs like being a Software engineer or a Scientist, water quality.  Being a woman.  Having other health conditions, such as: ? Diabetes. ? Obesity. ? A thyroid gland that is not active enough (hypothyroidism). ? Kidney failure. What are the signs or symptoms? Symptoms of this condition include:  A tingling feeling in your fingers.  Tingling or a loss of feeling (numbness) in your hand.  Pain in your entire arm. This pain may get worse when you bend your wrist and elbow for a long time.  Pain in your wrist that goes up your arm to your shoulder.  Pain that goes down into your palm or fingers.  A weak feeling in your hands. You may find it hard to  grab and hold items. You may feel worse at night. How is this diagnosed? This condition is diagnosed with a medical history and physical exam. You may also have tests, such as:  Electromyogram (EMG). This test checks the signals that the nerves send to the muscles.  Nerve conduction study. This test checks how well signals pass through your nerves.  Imaging tests, such as X-rays, ultrasound, and MRI. These tests check for what might be the cause of your condition. How is this treated? This condition may be treated with:  Lifestyle changes. You  will be asked to stop or change the activity that caused your problem.  Doing exercise and activities that make bones and muscles stronger (physical therapy).  Learning how to use your hand again (occupational therapy).  Medicines for pain and swelling (inflammation). You may have injections in your wrist.  A wrist splint.  Surgery. Follow these instructions at home: If you have a splint:  Wear the splint as told by your doctor. Remove it only as told by your doctor.  Loosen the splint if your fingers: ? Tingle. ? Lose feeling (become numb). ? Turn cold and blue.  Keep the splint clean.  If the splint is not waterproof: ? Do not let it get wet. ? Cover it with a watertight covering when you take a bath or a shower. Managing pain, stiffness, and swelling   If told, put ice on the painful area: ? If you have a removable splint, remove it as told by your doctor. ? Put ice in a plastic bag. ? Place a towel between your skin and the bag. ? Leave the ice on for 20 minutes, 2-3 times per day. General instructions  Take over-the-counter and prescription medicines only as told by your doctor.  Rest your wrist from any activity that may cause pain. If needed, talk with your boss at work about changes that can help your wrist heal.  Do any exercises as told by your doctor, physical therapist, or occupational therapist.  Keep all follow-up visits as told by your doctor. This is important. Contact a doctor if:  You have new symptoms.  Medicine does not help your pain.  Your symptoms get worse. Get help right away if:  You have very bad numbness or tingling in your wrist or hand. Summary  Carpal tunnel syndrome is a condition that causes pain in your hand and arm.  It is often caused by repeated wrist motions.  Lifestyle changes and medicines are used to treat this problem. Surgery may help in very bad cases.  Follow your doctor's instructions about wearing a splint,  resting your wrist, keeping follow-up visits, and calling for help. This information is not intended to replace advice given to you by your health care provider. Make sure you discuss any questions you have with your health care provider. Document Released: 09/22/2011 Document Revised: 02/09/2018 Document Reviewed: 02/09/2018 Elsevier Interactive Patient Education  2019 [employer].

## 2019-01-24 NOTE — Progress Notes (Signed)
[name] Rice is a 63 y.o. female who presents to [facility] today for left arm pain and swelling, angioedema, left foot hardware prominence and hand numbness and feeling cold.  Cresencia notes a several week history of angioedema causing facial swelling and arm swelling.  She has been discontinuing medications and stopped [place] she thought perhaps that was the cause.  She thinks this is helped the itching a bit but she continues to have some swelling.  She denies any tongue or significant lip swelling.  She currently is taking Xyzal daily for allergies.  She does not have an H2 blocker.  She is also currently taking olmesartan half tablet for blood pressure control.  She is taking that medication for some time without any problems previously.  She is not able to check her blood pressure at home.  She cannot find a wrist cuff that fits her.  She notes however starting yesterday she had some left arm swelling into the wrist and pain into the upper arm.  She thinks this is related to her angioedema.  She denies any change in activity or injury.  She denies any change in pain with shoulder motion or abduction.  She is tried some Tylenol for pain which helps.  She does not take ibuprofen Aleve NSAIDs or meloxicam.  Additionally she notes bilateral hand numbness and tingling feeling cold.  She notes this is been ongoing for a few weeks as well.  She thinks it is related to her arm swelling and angioedema but is not sure.  She has been using gabapentin some which helps a little.  She notes also that she had foot surgery last year for persistent Lisfranc injury.  She notes 1 of the screws at the dorsal medial foot is prominent.  She notes this is changed her skin moves.  She notes is not very painful but she is worried that it is worsening and potentially at risk for eroding through the skin.    ROS as above:  Exam:  BP (!) 100/53   Pulse 78   Wt 274 lb (124.3 kg)   LMP 10/18/1995   BMI 48.54 kg/m  Wt Readings from Last 5 Encounters:  01/24/19 274 lb (124.3 kg)  12/13/18 270 lb (122.5 kg)  11/12/18 263 lb (119.3 kg)  10/12/18 263 lb (119.3 kg)  09/25/18 267 lb (121.1 kg)    Gen: Well NAD HEENT: EOMI,  MMM no severe facial swelling Lungs: Normal work of breathing. CTABL Heart: RRR no MRG Abd: NABS, Soft. Nondistended, Nontender Exts: Brisk capillary refill, warm and well perfused.  Left arm Left largely normal-appearing slightly swollen.  Exam somewhat limited due to significant morbid obesity body habitus. Left shoulder normal motion normal strength. Is a small nodule under the skin at the ulnar wrist.  This is palpable but nontender and freely mobile.  Normal wrist motion. Wrist bilaterally and hands bilaterally normal-appearing with normal motion.  Negative Tinel's bilateral carpal tunnel. Positive Phalen's test bilaterally. Capillary refill and sensation are intact distally. Pulses are intact wrist bilaterally.  Right foot: Relatively normal-appearing with mature scar of the dorsal foot.  Palpable firm object subcutaneous to the dorsal medial midfoot likely corresponding to prominent screw head seen on foot x-ray in the fall 2019.  Lab and Radiology Results X-ray images right foot personally independently reviewed Intact surgical hardware with no significant change in positioning prior x-ray. Await formal radiology review  Limited musculoskeletal  ultrasound left ulnar wrist nodule Subcutaneous soft tissue present with no cystic changes.  Normal bony and tendinous structures. Impression: Likely lipoma  Assessment and Plan: 63 y.o. female with  Angioedema: Unclear etiology.  Plan to discontinue angiotensin receptor blocker as blood pressure is well controlled well and could be a contributing factor.  Additionally will maximize antihistamines.  We will try  twice daily Xyzal and add twice daily famotidine.  Additionally will use short tapering course of prednisone.  She is quite symptomatic especially with her arm pain and I think the risk is outweighed by the benefit.  We discussed risk including hyperglycemia as well as risk with COVID-19.  Arm pain: Unclear etiology however swelling and angioedema is a possibility.  Plan for treatment as above with prednisone.  Given her symptoms especially arm pain prednisone is more likely warranted.  Left wrist nodule: Unclear etiology.  Likely simple lipoma.  Ultrasound did not show any cystic structures.  Watchful waiting.  Hand numbness and tingling bilaterally is very likely carpal tunnel syndrome.  Patient has positive Phalen's test and symptoms predominantly in the median nerve distribution.  I doubt that wrist braces will fit.  Plan for prednisone as above and watchful waiting.  Hopefully with improved angioedema and swelling her symptoms will improve as well.  Prominent surgical hardware: Not very symptomatic but the hardware is prominent on physical exam as well as on x-ray.  Await formal radiology over read.  Watchful waiting.  Patient has multiple active medical conditions and I am changing several medications.  Plan for prompt 1 week follow-up.  We will schedule follow-up visit for video conferencing virtual visit to minimize risk for exposure to COVID-19   Orders Placed This Encounter  Procedures  . DG Foot Complete Right    Standing Status:   Future    Number of Occurrences:   1    Standing Expiration Date:   03/25/2020    Order Specific Question:   Reason for Exam (SYMPTOM  OR DIAGNOSIS REQUIRED)    Answer:   eval screw dorsal foot.    Order Specific Question:   Preferred imaging location?    Answer:   [facility]    Order Specific Question:   Radiology Contrast Protocol - do NOT remove file path    Answer:   [email or web address]   Meds  ordered this encounter  Medications  . levocetirizine (XYZAL) 5 MG tablet    Sig: Take 1 tablet (5 mg total) by mouth 2 (two) times daily.    Dispense:  180 tablet    Refill:  3    For angioedema and urticaria  . famotidine (PEPCID) 20 MG tablet    Sig: Take 1 tablet (20 mg total) by mouth 2 (two) times daily.    Dispense:  60 tablet    Refill:  3  . predniSONE (STERAPRED UNI-PAK 48 TAB) 5 MG (48) TBPK tablet    Sig: 12 day dosepack po    Dispense:  48 tablet    Refill:  0     Historical information moved to improve visibility of documentation.  Past Medical History:  Diagnosis Date  . Anxiety   . Arthritis   . Asthma    no PFTs  . Cervical dystonia   . COPD (chronic obstructive pulmonary disease) (HCC)    no PFTs  . Depression   . Diabetes mellitus   . Diarrhea   . Diverticulitis   . Diverticulosis   . Dizziness   . Gastroparesis   .  GERD (gastroesophageal reflux disease)   . History of colonic polyps    hyperplastic  . History of kidney stones   . History of seborrheic dermatitis    face,ears  . HLD (hyperlipidemia)   . Hypertension   . Intussusception ([place])   . Lisfranc's sprain, left, initial encounter 10/06/2017  . Lumbar pain 2011   per x-ray - Multilevel spondylosis  . Morbid obesity ([place])   . Nephrolithiasis   . OSA on CPAP 2008  . Pneumonia   . Recurrent boils    "SLUSTER BOILS" ON ABD AND LABIA  . Sleep apnea    USES C-PAP  . Torn rotator cuff RT SHOULDER  . Tremor CCENTRAL NERVOUS SYSTEM   Past Surgical History:  Procedure Laterality Date  . CARPAL TUNNEL RELEASE Left 2006  . ELBOW SURGERY Left   . HAND SURGERY Right RIGHT  . KNEE ARTHROSCOPY Left   . LUMBAR LAMINECTOMY    . MASS EXCISION  06/28/2012   Procedure: EXCISION MASS;  Surgeon: Harl Bowie, MD;  Location: [facility];  Service: General;  Laterality: Left;  excision left upper quadrant abdominal wall mass, Benign Lipoma  . NASAL SINUS SURGERY    . PILONIDAL CYST EXCISION   [id number]  . SUBACROMIAL DECOMPRESSION Right   . TUBAL LIGATION     Social History   Tobacco Use  . Smoking status: Former Smoker    Types: Cigarettes    Last attempt to quit: 10/17/1996    Years since quitting: 22.2  . Smokeless tobacco: Never Used  Substance Use Topics  . Alcohol use: No   family history includes Depression in her brother, brother, and sister; Diabetes in her brother, brother, mother, and sister; Heart attack in her brother and brother; Heart disease in her brother; Hyperlipidemia in her brother, brother, mother, and sister; Hypertension in her brother, brother, mother, and sister; Kidney cancer in her brother; Kidney disease in her mother; Stroke in her sister.  Medications: Current Outpatient Medications  Medication Sig Dispense Refill  . acetic acid-hydrocortisone (VOSOL-HC) OTIC solution Place 5 drops into both ears 2 (two) times daily. 10 mL 0  . AMBULATORY NON FORMULARY MEDICATION 4 prong cane DX R06.89 1 each 0  . AMBULATORY NON FORMULARY MEDICATION Diabetic shoes  Dx: Diabetes, uncontrolled 250.60 with neuropathy. 1 Device 0  . AMBULATORY NON FORMULARY MEDICATION Test strips and needles for one touch Verio IQ glucometer.  Use to test blood sugar up to four times a day. Include all supplies needed including cartridge for lancets.  Diagnosis Type 2 Diabetes 100 each 3  . buPROPion (WELLBUTRIN XL) 150 MG 24 hr tablet TAKE 1 TABLET(150 MG) BY MOUTH DAILY 90 tablet 2  . Capsaicin 0.033 % CREA Apply 1 inch topically 4 (four) times daily. 56.6 g 12  . DULoxetine (CYMBALTA) 60 MG capsule TAKE 2 CAPSULES BY MOUTH EVERY DAY 60 capsule 0  . fluticasone (FLONASE) 50 MCG/ACT nasal spray Place 2 sprays into both nostrils daily. 16 g 2  . furosemide (LASIX) 20 MG tablet Take 1 tablet (20 mg total) by mouth 2 (two) times daily. 60 tablet 2  . gabapentin (NEURONTIN) 800 MG tablet Take 1 tablet (800 mg total) by mouth 3 (three) times daily. 90 tablet 5  .  HYDROcodone-acetaminophen (NORCO/VICODIN) 5-325 MG tablet TAKE 1 TO 2 TABLETS BY MOUTH three times DAILY as needed 90 tablet 0  . hydrOXYzine (ATARAX/VISTARIL) 50 MG tablet Take 1 tablet (50 mg total) by mouth every 8 (eight) hours  as needed. For itching. (Patient taking differently: Take 25 mg by mouth every 8 (eight) hours as needed. For itching.) 90 tablet 2  . Insulin Pen Needle (NOVOFINE) 32G X 6 MM MISC USE 3 TIMES A DAY WITH NOVOLOG 100 each 2  . levocetirizine (XYZAL) 5 MG tablet Take 1 tablet (5 mg total) by mouth 2 (two) times daily. 180 tablet 3  . liraglutide (VICTOZA) 18 MG/3ML SOPN ADMINISTER 1.8 MG UNDER THE SKIN DAILY 6 mL 0  . LORazepam (ATIVAN) 0.5 MG tablet Take 1 tablet (0.5 mg total) by mouth every 8 (eight) hours as needed for anxiety. 20 tablet 0  . meclizine (ANTIVERT) 25 MG tablet Take 1 tablet (25 mg total) by mouth 3 (three) times daily as needed for dizziness. 30 tablet 0  . nystatin (MYCOSTATIN/NYSTOP) powder Apply topically 4 (four) times daily. 60 g 0  . nystatin ointment (MYCOSTATIN) apply to affected area twice a day 60 g 1  . pioglitazone (ACTOS) 30 MG tablet TAKE 1 TABLET BY MOUTH DAILY 30 tablet 0  . pravastatin (PRAVACHOL) 10 MG tablet Take 10 mg by mouth daily.    . propranolol (INDERAL) 20 MG tablet TAKE 1 TABLET BY MOUTH TWICE DAILY. 180 tablet 0  . famotidine (PEPCID) 20 MG tablet Take 1 tablet (20 mg total) by mouth 2 (two) times daily. 60 tablet 3  . Insulin Glargine (LANTUS SOLOSTAR) 100 UNIT/ML Solostar Pen Inject 40 Units into the skin at bedtime. Please include QS 3 months. 36 mL 1  . predniSONE (STERAPRED UNI-PAK 48 TAB) 5 MG (48) TBPK tablet 12 day dosepack po 48 tablet 0   No current facility-administered medications for this visit.    Allergies  Allergen Reactions  . Ace Inhibitors Other (See Comments)    Causes blood pressure to drop rapidly  . Atrovent [Ipratropium] Swelling    Throat swelling on Atorvent Nasal Spray  . Cefaclor Hives and  Itching  . Glyxambi [Empagliflozin-Linagliptin]     Itching/rash  . Shrimp [Shellfish Allergy] Nausea And Vomiting  . Ceclor [Cefaclor]   . Sulfa Antibiotics   . Sulfonamide Derivatives Rash     Discussed warning signs or symptoms. Please see discharge instructions. Patient expresses understanding.

## 2019-01-31 ENCOUNTER — Encounter: Payer: Self-pay | Admitting: Family Medicine

## 2019-01-31 ENCOUNTER — Ambulatory Visit (INDEPENDENT_AMBULATORY_CARE_PROVIDER_SITE_OTHER): Payer: Medicare HMO | Admitting: Family Medicine

## 2019-01-31 VITALS — Ht 63.0 in | Wt 274.0 lb

## 2019-01-31 DIAGNOSIS — I10 Essential (primary) hypertension: Secondary | ICD-10-CM

## 2019-01-31 DIAGNOSIS — T783XXA Angioneurotic edema, initial encounter: Secondary | ICD-10-CM

## 2019-01-31 DIAGNOSIS — E1159 Type 2 diabetes mellitus with other circulatory complications: Secondary | ICD-10-CM

## 2019-01-31 DIAGNOSIS — G5603 Carpal tunnel syndrome, bilateral upper limbs: Secondary | ICD-10-CM | POA: Diagnosis not present

## 2019-01-31 DIAGNOSIS — I152 Hypertension secondary to endocrine disorders: Secondary | ICD-10-CM

## 2019-01-31 NOTE — Progress Notes (Addendum)
Virtual Visit  via Video Note  I connected with      [name] Rice  by a video enabled telemedicine application and verified that I am speaking with the correct person using two identifiers.   I discussed the limitations of evaluation and management by telemedicine and the availability of in person appointments. The patient expressed understanding and agreed to proceed.  History of Present Illness: [name] Rice is a 63 y.o. female who would like to discuss follow-up facial swelling and arm swelling.  Kenzleigh was seen in clinic on April 9.  She had a variety of issues but most importantly was thought to be angioedema.  This was treated with maximizing antihistamines including Zyrtec and Xyzal cumulatively twice daily, and famotidine twice daily.  Additionally she was given a short prednisone tapering course.  She notes that she is feeling much better with significantly less swelling.  She denies any further tongue or lip swelling.  Additionally her blood pressure was a bit low and she was having some light headedness symptoms and her blood pressure was an angiotensin receptor blocker.  This was discontinued in the meantime she is feeling much better.  Unfortunately she cannot find a blood pressure cuff at home to fit her arm but thinks her blood pressures are okay.  Additionally she had some hand numbness and tingling that was thought to be due to carpal tunnel syndrome.  She notes this has improved as well.   Observations/Objective: Ht 5\' 3"  (1.6 m)   Wt 274 lb (124.3 kg)   LMP 10/18/1995   BMI 48.54 kg/m  Wt Readings from Last 5 Encounters:  01/31/19 274 lb (124.3 kg)  01/24/19 274 lb (124.3 kg)  12/13/18 270 lb (122.5 kg)  11/12/18 263 lb (119.3 kg)  10/12/18 263 lb (119.3 kg)   Exam:  Normal Speech.     Lab and Radiology Results No results found for this or any previous visit (from the past 72 hour(s)). No results found.   Assessment and Plan: 63 y.o. female with   Arm and face swelling due to angioedema: Significant improvement with discontinuation of angiotensin receptor blocker, antihistamine maximization and tapering prednisone course.  Watchful waiting and recheck as needed in the future.  Hypertension: Hard to assess without accurate blood pressure measurement however symptomatically improved.  Will reassess blood pressure next opportunity for in person office visit.  However risk of COVID-19 in her is high enough that an office visits are generally not recommended.  Carpal tunnel syndrome: Improvement with decreased arm swelling and steroids.  Watchful waiting after discontinuation of steroids.  PDMP not reviewed this encounter. No orders of the defined types were placed in this encounter.  No orders of the defined types were placed in this encounter.   Follow Up Instructions:    I discussed the assessment and treatment plan with the patient. The patient was provided an opportunity to ask questions and all were answered. The patient agreed with the plan and demonstrated an understanding of the instructions.   The patient was advised to call back or seek an in-person evaluation if the symptoms worsen or if the condition fails to improve as anticipated.  I provided 25 minutes of non-face-to-face time during this encounter.    Historical information moved to improve visibility of documentation.  Past Medical History:  Diagnosis Date  . Anxiety   . Arthritis   . Asthma    no PFTs  . Cervical dystonia   . COPD (  chronic obstructive pulmonary disease) (HCC)    no PFTs  . Depression   . Diabetes mellitus   . Diarrhea   . Diverticulitis   . Diverticulosis   . Dizziness   . Gastroparesis   . GERD (gastroesophageal reflux disease)   . History of colonic polyps    hyperplastic  . History of kidney stones   . History of seborrheic dermatitis    face,ears  . HLD (hyperlipidemia)   . Hypertension   . Intussusception ([place])   .  Lisfranc's sprain, left, initial encounter 10/06/2017  . Lumbar pain 2011   per x-ray - Multilevel spondylosis  . Morbid obesity ([place])   . Nephrolithiasis   . OSA on CPAP 2008  . Pneumonia   . Recurrent boils    "SLUSTER BOILS" ON ABD AND LABIA  . Sleep apnea    USES C-PAP  . Torn rotator cuff RT SHOULDER  . Tremor CCENTRAL NERVOUS SYSTEM   Past Surgical History:  Procedure Laterality Date  . CARPAL TUNNEL RELEASE Left 2006  . ELBOW SURGERY Left   . HAND SURGERY Right RIGHT  . KNEE ARTHROSCOPY Left   . LUMBAR LAMINECTOMY    . MASS EXCISION  06/28/2012   Procedure: EXCISION MASS;  Surgeon: Harl Bowie, MD;  Location: [facility];  Service: General;  Laterality: Left;  excision left upper quadrant abdominal wall mass, Benign Lipoma  . NASAL SINUS SURGERY    . PILONIDAL CYST EXCISION  [id number]  . SUBACROMIAL DECOMPRESSION Right   . TUBAL LIGATION     Social History   Tobacco Use  . Smoking status: Former Smoker    Types: Cigarettes    Last attempt to quit: 10/17/1996    Years since quitting: 22.3  . Smokeless tobacco: Never Used  Substance Use Topics  . Alcohol use: No   family history includes Depression in her brother, brother, and sister; Diabetes in her brother, brother, mother, and sister; Heart attack in her brother and brother; Heart disease in her brother; Hyperlipidemia in her brother, brother, mother, and sister; Hypertension in her brother, brother, mother, and sister; Kidney cancer in her brother; Kidney disease in her mother; Stroke in her sister.  Medications: Current Outpatient Medications  Medication Sig Dispense Refill  . acetic acid-hydrocortisone (VOSOL-HC) OTIC solution Place 5 drops into both ears 2 (two) times daily. 10 mL 0  . AMBULATORY NON FORMULARY MEDICATION 4 prong cane DX R06.89 1 each 0  . AMBULATORY NON FORMULARY MEDICATION Diabetic shoes  Dx: Diabetes, uncontrolled 250.60 with neuropathy. 1 Device 0  . AMBULATORY NON FORMULARY MEDICATION  Test strips and needles for one touch Verio IQ glucometer.  Use to test blood sugar up to four times a day. Include all supplies needed including cartridge for lancets.  Diagnosis Type 2 Diabetes 100 each 3  . buPROPion (WELLBUTRIN XL) 150 MG 24 hr tablet TAKE 1 TABLET(150 MG) BY MOUTH DAILY 90 tablet 2  . Capsaicin 0.033 % CREA Apply 1 inch topically 4 (four) times daily. 56.6 g 12  . DULoxetine (CYMBALTA) 60 MG capsule TAKE 2 CAPSULES BY MOUTH EVERY DAY 60 capsule 0  . famotidine (PEPCID) 20 MG tablet Take 1 tablet (20 mg total) by mouth 2 (two) times daily. 60 tablet 3  . fluticasone (FLONASE) 50 MCG/ACT nasal spray Place 2 sprays into both nostrils daily. 16 g 2  . furosemide (LASIX) 20 MG tablet Take 1 tablet (20 mg total) by mouth 2 (two) times daily. 60 tablet 2  .  gabapentin (NEURONTIN) 800 MG tablet Take 1 tablet (800 mg total) by mouth 3 (three) times daily. 90 tablet 5  . HYDROcodone-acetaminophen (NORCO/VICODIN) 5-325 MG tablet TAKE 1 TO 2 TABLETS BY MOUTH three times DAILY as needed 90 tablet 0  . hydrOXYzine (ATARAX/VISTARIL) 50 MG tablet Take 1 tablet (50 mg total) by mouth every 8 (eight) hours as needed. For itching. (Patient taking differently: Take 25 mg by mouth every 8 (eight) hours as needed. For itching.) 90 tablet 2  . Insulin Pen Needle (NOVOFINE) 32G X 6 MM MISC USE 3 TIMES A DAY WITH NOVOLOG 100 each 2  . levocetirizine (XYZAL) 5 MG tablet Take 1 tablet (5 mg total) by mouth 2 (two) times daily. 180 tablet 3  . liraglutide (VICTOZA) 18 MG/3ML SOPN ADMINISTER 1.8 MG UNDER THE SKIN DAILY 6 mL 0  . LORazepam (ATIVAN) 0.5 MG tablet Take 1 tablet (0.5 mg total) by mouth every 8 (eight) hours as needed for anxiety. 20 tablet 0  . meclizine (ANTIVERT) 25 MG tablet Take 1 tablet (25 mg total) by mouth 3 (three) times daily as needed for dizziness. 30 tablet 0  . nystatin (MYCOSTATIN/NYSTOP) powder Apply topically 4 (four) times daily. 60 g 0  . nystatin ointment (MYCOSTATIN) apply  to affected area twice a day 60 g 1  . pioglitazone (ACTOS) 30 MG tablet TAKE 1 TABLET BY MOUTH DAILY 30 tablet 0  . pravastatin (PRAVACHOL) 10 MG tablet Take 10 mg by mouth daily.    . propranolol (INDERAL) 20 MG tablet TAKE 1 TABLET BY MOUTH TWICE DAILY. 180 tablet 0  . Insulin Glargine (LANTUS SOLOSTAR) 100 UNIT/ML Solostar Pen Inject 40 Units into the skin at bedtime. Please include QS 3 months. 36 mL 1   No current facility-administered medications for this visit.    Allergies  Allergen Reactions  . Ace Inhibitors Other (See Comments)    Causes blood pressure to drop rapidly  . Atrovent [Ipratropium] Swelling    Throat swelling on Atorvent Nasal Spray  . Cefaclor Hives and Itching  . Glyxambi [Empagliflozin-Linagliptin]     Itching/rash  . Shrimp [Shellfish Allergy] Nausea And Vomiting  . Ceclor [Cefaclor]   . Sulfa Antibiotics   . Sulfonamide Derivatives Rash   Addendum to correct date error due to templating issue

## 2019-01-31 NOTE — Patient Instructions (Signed)
Thank you for coming in today. Finish out prednisone course.  Keep me updated with how things are going.

## 2019-02-11 ENCOUNTER — Telehealth (INDEPENDENT_AMBULATORY_CARE_PROVIDER_SITE_OTHER): Payer: Medicare HMO | Admitting: Physician Assistant

## 2019-02-11 ENCOUNTER — Encounter: Payer: Self-pay | Admitting: Physician Assistant

## 2019-02-11 VITALS — Ht 63.0 in

## 2019-02-11 DIAGNOSIS — L304 Erythema intertrigo: Secondary | ICD-10-CM

## 2019-02-11 DIAGNOSIS — I10 Essential (primary) hypertension: Secondary | ICD-10-CM

## 2019-02-11 DIAGNOSIS — Z794 Long term (current) use of insulin: Secondary | ICD-10-CM

## 2019-02-11 DIAGNOSIS — T783XXS Angioneurotic edema, sequela: Secondary | ICD-10-CM | POA: Diagnosis not present

## 2019-02-11 DIAGNOSIS — I152 Hypertension secondary to endocrine disorders: Secondary | ICD-10-CM

## 2019-02-11 DIAGNOSIS — E1142 Type 2 diabetes mellitus with diabetic polyneuropathy: Secondary | ICD-10-CM

## 2019-02-11 DIAGNOSIS — E1159 Type 2 diabetes mellitus with other circulatory complications: Secondary | ICD-10-CM | POA: Diagnosis not present

## 2019-02-11 DIAGNOSIS — E118 Type 2 diabetes mellitus with unspecified complications: Secondary | ICD-10-CM

## 2019-02-11 MED ORDER — NYSTATIN 100000 UNIT/GM EX POWD: Freq: Four times a day (QID) | CUTANEOUS | 2 refills | Status: DC

## 2019-02-11 MED ORDER — PIOGLITAZONE HCL 30 MG PO TABS: 30.0000 mg | ORAL_TABLET | Freq: Every day | ORAL | 1 refills | Status: DC

## 2019-02-11 NOTE — Progress Notes (Deleted)
Only issue is hives, hydroxyzine is helping with itching but hives are still present, causing issues with sleep.

## 2019-02-11 NOTE — Progress Notes (Signed)
Patient ID: [name] Rice, female   DOB: 1956/03/12, 63 y.o.   MRN: [id number] .Marland KitchenVirtual Visit via Video Note  I connected with [name] Rice on 02/11/19 at  1:20 PM EDT by a video enabled telemedicine application and verified that I am speaking with the correct person using two identifiers.   I discussed the limitations of evaluation and management by telemedicine and the availability of in person appointments. The patient expressed understanding and agreed to proceed.  History of Present Illness: Pt is a 63 yo female with T2DM, HTN, OSA, restrictive lung disease with recent angioedema after starting benicar who calls in for follow up.   She is much better since stopping benicar earlier this month. Finished prednisone today. Continues to have itchy rash on upper extermities. Vistaril helps.   Not checking BP. No CP, palpitations, headaches.   Not checking sugars. Not taking insulin just oral medications. No hypoglycemia. No open wounds.   .. Active Ambulatory Problems    Diagnosis Date Noted  . Type II or unspecified type diabetes mellitus with neurological manifestations, not stated as uncontrolled(250.60) 08/08/2006  . DYSLIPIDEMIA 08/08/2006  . Depression with anxiety 01/28/2008  . Hypertension associated with diabetes ([place]) 08/08/2006  . GERD 08/08/2006  . Acute bilateral low back pain 11/27/2007  . BPPV (benign paroxysmal positional vertigo), unspecified laterality 07/14/2009  . OSA on CPAP   . Insomnia 02/06/2011  . Preventative health care 08/16/2011  . History of allergic rhinitis 09/07/2011  . Iron deficiency anemia 03/29/2012  . Lipoma of abdominal wall 05/01/2012  . Intestinal bacterial overgrowth 09/20/2012  . SOB (shortness of breath) on exertion 03/31/2013  . Hepatic steatosis 09/26/2013  . Diverticulitis 11/27/2013  . Diabetic peripheral neuropathy ([place]) 05/30/2014  . Gastroparesis due to secondary diabetes ([place]) 09/16/2014  . Ear pain 12/09/2014  . Diarrhea  01/12/2015  . Non-proliferative diabetic retinopathy, right eye ([place]) 11/23/2015  . Chronic pain syndrome 12/15/2015  . Morbid obesity due to excess calories ([place]) 02/16/2016  . Right shoulder pain 02/16/2016  . Unstable balance 02/16/2016  . Intertrigo 03/15/2016  . Left knee pain 03/15/2016  . Post-menopausal 03/16/2016  . Acute reaction to stress 03/28/2016  . Dystonia 09/21/2016  . Neck pain 07/11/2017  . Lisfranc's dislocation, right, initial encounter 10/06/2017  . Freiberg's disease, right 10/06/2017  . Vasomotor rhinitis 11/28/2017  . Ear itching 01/17/2018  . No energy 01/17/2018  . Hordeolum externum of right lower eyelid 02/16/2018  . Hot flashes 02/16/2018  . Restrictive lung disease 04/12/2018  . Mixed hyperlipidemia 04/12/2018  . Vitamin D insufficiency 04/12/2018  . Drug-induced constipation 05/23/2018  . Calcification and ossification of muscle 07/13/2018  . Pressure injury of right foot, unstageable ([place]) 09/25/2018  . Bilateral carpal tunnel syndrome 01/24/2019   Resolved Ambulatory Problems    Diagnosis Date Noted  . IRON DEFICIENCY 01/30/2007  . DEPRESSION 08/08/2006  . TORTICOLLIS, SPASMODIC 02/13/2007  . SINUSITIS, ACUTE 05/27/2009  . ALLERGIC RHINITIS 12/25/2006  . ASTHMA 01/28/2008  . COPD 08/08/2006  . DIVERTICULOSIS, COLON 02/12/2003  . DERMATITIS, SEBORRHEIC 01/30/2007  . GOUT, HX OF 08/08/2006  . PERSONAL HX COLONIC POLYPS 09/16/2008  . NEPHROLITHIASIS, HX OF 01/28/2008  . Calf pain 11/29/2010  . Tremor, essential 02/06/2011  . Seborrheic dermatitis, unspecified 04/06/2011  . Finger pain 04/06/2011  . Shoulder pain, right 08/16/2011  . Left leg pain 04/10/2012  . Right ear pain 05/01/2012  . Seborrheic dermatitis 05/01/2012  . Hypoglycemia 05/01/2012  . Vaginal candidiasis 08/22/2012  . Sigmoid  diverticulitis 09/15/2012  . Acute-on-chronic kidney disease Stage II 09/15/2012  . Cyst, breast 03/01/2013  . Lactic acidosis 03/31/2013  .  Acute renal insufficiency 03/31/2013  . Abdominal pain, left lower quadrant 04/29/2013  . Gout 05/01/2013  . COPD (chronic obstructive pulmonary disease) with chronic bronchitis ([place]) 12/09/2014  . Type 2 diabetes, controlled, with peripheral neuropathy ([place]) 12/09/2014  . Fall 03/16/2016  . Foot fracture, right, closed, initial encounter 08/23/2017   Past Medical History:  Diagnosis Date  . Anxiety   . Arthritis   . Asthma   . Cervical dystonia   . COPD (chronic obstructive pulmonary disease) ([place])   . Depression   . Diabetes mellitus   . Diverticulosis   . Dizziness   . Gastroparesis   . GERD (gastroesophageal reflux disease)   . History of colonic polyps   . History of kidney stones   . History of seborrheic dermatitis   . HLD (hyperlipidemia)   . Hypertension   . Intussusception ([place])   . Lisfranc's sprain, left, initial encounter 10/06/2017  . Lumbar pain 2011  . Morbid obesity ([place])   . Nephrolithiasis   . Pneumonia   . Recurrent boils   . Sleep apnea   . Torn rotator cuff RT SHOULDER  . Tremor CCENTRAL NERVOUS SYSTEM      Observations/Objective: No acute distress.  Normal mood.  Phone call only not able to visualize the skin.  .. Today's Vitals   02/11/19 1326  Height: 5\' 3"  (1.6 m)   Body mass index is 48.54 kg/m.    Assessment and Plan:  Marland KitchenMarland KitchenDiagnoses and all orders for this visit:  Angioedema, sequela  Intertrigo -     nystatin (MYCOSTATIN/NYSTOP) powder; Apply topically 4 (four) times daily.  Diabetic peripheral neuropathy ([facility]) -     pioglitazone (ACTOS) 30 MG tablet; Take 1 tablet (30 mg total) by mouth daily.  Type 2 diabetes mellitus with complication, with long-term current use of insulin ([facility]) -     pioglitazone (ACTOS) 30 MG tablet; Take 1 tablet (30 mg total) by mouth daily.  angioedema better. Still some itching. Use topical steroid and as needed vistaril.   Lab Results  Component Value Date   HGBA1C 7.2 (A) 11/12/2018   Due  to COVID, not able to go to lab to recheck a1c.  Continue oral medications.   Labs in 1-2 months.   Needs BP check in next month to make sure ok.       Follow Up Instructions:    I discussed the assessment and treatment plan with the patient. The patient was provided an opportunity to ask questions and all were answered. The patient agreed with the plan and demonstrated an understanding of the instructions.   The patient was advised to call back or seek an in-person evaluation if the symptoms worsen or if the condition fails to improve as anticipated.  I provided  25 minutes of non-face-to-face time during this encounter.   Iran Planas, PA-C

## 2019-02-14 ENCOUNTER — Encounter: Payer: Self-pay | Admitting: Physician Assistant

## 2019-02-15 ENCOUNTER — Other Ambulatory Visit: Payer: Self-pay | Admitting: Physician Assistant

## 2019-02-15 DIAGNOSIS — E1142 Type 2 diabetes mellitus with diabetic polyneuropathy: Secondary | ICD-10-CM

## 2019-02-28 DIAGNOSIS — J449 Chronic obstructive pulmonary disease, unspecified: Secondary | ICD-10-CM | POA: Diagnosis not present

## 2019-02-28 DIAGNOSIS — G4733 Obstructive sleep apnea (adult) (pediatric): Secondary | ICD-10-CM | POA: Diagnosis not present

## 2019-03-06 ENCOUNTER — Encounter: Payer: Self-pay | Admitting: Physician Assistant

## 2019-03-06 DIAGNOSIS — M545 Low back pain, unspecified: Secondary | ICD-10-CM

## 2019-03-06 DIAGNOSIS — M542 Cervicalgia: Secondary | ICD-10-CM

## 2019-03-06 DIAGNOSIS — G894 Chronic pain syndrome: Secondary | ICD-10-CM

## 2019-03-06 MED ORDER — HYDROCODONE-ACETAMINOPHEN 5-325 MG PO TABS: ORAL_TABLET | ORAL | 0 refills | Status: DC

## 2019-03-06 NOTE — Telephone Encounter (Signed)
Ok to write letter stating need for live in care giver due to chronic medical conditions. I usally attach problem list.

## 2019-03-08 ENCOUNTER — Encounter: Payer: Self-pay | Admitting: Neurology

## 2019-03-08 NOTE — Telephone Encounter (Signed)
Letter written, will print and mail when I am back in the office.

## 2019-03-12 NOTE — Telephone Encounter (Signed)
Letter sent.

## 2019-03-18 ENCOUNTER — Other Ambulatory Visit: Payer: Self-pay | Admitting: Physician Assistant

## 2019-03-18 DIAGNOSIS — Z794 Long term (current) use of insulin: Secondary | ICD-10-CM

## 2019-03-18 DIAGNOSIS — E118 Type 2 diabetes mellitus with unspecified complications: Secondary | ICD-10-CM

## 2019-03-18 DIAGNOSIS — E1140 Type 2 diabetes mellitus with diabetic neuropathy, unspecified: Secondary | ICD-10-CM

## 2019-03-18 DIAGNOSIS — E1142 Type 2 diabetes mellitus with diabetic polyneuropathy: Secondary | ICD-10-CM

## 2019-03-18 DIAGNOSIS — R0602 Shortness of breath: Secondary | ICD-10-CM

## 2019-04-18 ENCOUNTER — Ambulatory Visit (INDEPENDENT_AMBULATORY_CARE_PROVIDER_SITE_OTHER): Payer: Medicare HMO | Admitting: Family Medicine

## 2019-04-18 ENCOUNTER — Ambulatory Visit (INDEPENDENT_AMBULATORY_CARE_PROVIDER_SITE_OTHER): Payer: Medicare HMO

## 2019-04-18 ENCOUNTER — Other Ambulatory Visit: Payer: Self-pay

## 2019-04-18 VITALS — BP 148/83 | HR 79 | Temp 98.2°F | Wt 270.0 lb

## 2019-04-18 DIAGNOSIS — S199XXA Unspecified injury of neck, initial encounter: Secondary | ICD-10-CM | POA: Diagnosis not present

## 2019-04-18 DIAGNOSIS — R29898 Other symptoms and signs involving the musculoskeletal system: Secondary | ICD-10-CM

## 2019-04-18 DIAGNOSIS — M542 Cervicalgia: Secondary | ICD-10-CM | POA: Diagnosis not present

## 2019-04-18 DIAGNOSIS — M79604 Pain in right leg: Secondary | ICD-10-CM

## 2019-04-18 DIAGNOSIS — M79602 Pain in left arm: Secondary | ICD-10-CM

## 2019-04-18 DIAGNOSIS — M549 Dorsalgia, unspecified: Secondary | ICD-10-CM | POA: Diagnosis not present

## 2019-04-18 DIAGNOSIS — M47814 Spondylosis without myelopathy or radiculopathy, thoracic region: Secondary | ICD-10-CM | POA: Diagnosis not present

## 2019-04-18 DIAGNOSIS — M545 Low back pain: Secondary | ICD-10-CM

## 2019-04-18 DIAGNOSIS — M79605 Pain in left leg: Secondary | ICD-10-CM

## 2019-04-18 DIAGNOSIS — W19XXXA Unspecified fall, initial encounter: Secondary | ICD-10-CM

## 2019-04-18 DIAGNOSIS — M79601 Pain in right arm: Secondary | ICD-10-CM

## 2019-04-18 DIAGNOSIS — M47816 Spondylosis without myelopathy or radiculopathy, lumbar region: Secondary | ICD-10-CM | POA: Diagnosis not present

## 2019-04-18 NOTE — Patient Instructions (Addendum)
Thank you for coming in today. Ok to increase hydrocodone dose.  OK to increase gabapentin.   I am concerned about a compression fracture.  Likely we will do a CT scan.   Ok to move a little.  Take it easy.   [name] Rude, MD  Spinal Compression Fracture  A spinal compression fracture is a collapse of the bones that form the spine (vertebrae). With this type of fracture, the vertebrae become pushed (compressed) into a wedge shape. Most compression fractures happen in the middle or lower part of the spine. What are the causes? This condition may be caused by:  Thinning and loss of density in the bones (osteoporosis). This is the most common cause.  A fall.  A car or motorcycle accident.  Cancer.  Trauma, such as a heavy, direct hit to the head or back. What increases the risk? You are more likely to develop this condition if:  You are 86 years or older.  You have osteoporosis.  You have certain types of cancer, including: ? Multiple myeloma. ? Lymphoma. ? Prostate cancer. ? Lung cancer. ? Breast cancer. What are the signs or symptoms? Symptoms of this condition include:  Severe pain.  Pain that gets worse over time.  Pain that is worse when you stand, walk, sit, or bend.  Sudden pain that is so bad that it is hard for you to move.  Bending or humping of the spine.  Gradual loss of height.  Numbness, tingling, or weakness in the back and legs.  Trouble walking. Your symptoms will depend on the cause of the fracture and how quickly it develops. How is this diagnosed? This condition may be diagnosed based on symptoms, medical history, and a physical exam. During the physical exam, your health care provider may tap along the length of your spine to check for tenderness. Tests may be done to confirm the diagnosis. They may include:  A bone mineral density test to check for osteoporosis.  Imaging tests, such as a spine X-ray, CT scan, or MRI. How is this  treated? Treatment for this condition depends on the cause and severity of the condition. Some fractures may heal on their own with supportive care. Treatment may include:  Pain medicine.  Rest.  A back brace.  Physical therapy exercises.  Medicine to strengthen bone.  Calcium and vitamin D supplements. Fractures that cause the back to become misshapen, cause nerve pain or weakness, or do not respond to other treatment may be treated with surgery. This may include:  Vertebroplasty. Bone cement is injected into the collapsed vertebrae to stabilize them.  Balloon kyphoplasty. The collapsed vertebrae are expanded with a balloon and then bone cement is injected into them.  Spinal fusion. The collapsed vertebrae are connected (fused) to normal vertebrae. Follow these instructions at home: Medicines  Take over-the-counter and prescription medicines only as told by your health care provider.  Do not drive or operate heavy machinery while taking prescription pain medicine.  If you are taking prescription pain medicine, take actions to prevent or treat constipation. Your health care provider may recommend that you: ? Drink enough fluid to keep your urine pale yellow. ? Eat foods that are high in fiber, such as fresh fruits and vegetables, whole grains, and beans. ? Limit foods that are high in fat and processed sugars, such as fried or sweet foods. ? Take an over-the-counter or prescription medicine for constipation. If you have a brace:  Wear the brace as told by  your health care provider. Remove it only as told by your health care provider.  Loosen the brace if your fingers or toes tingle, become numb, or turn cold and blue.  Keep the brace clean.  If the brace is not waterproof: ? Do not let it get wet. ? Cover it with a watertight covering when you take a bath or a shower. Managing pain, stiffness, and swelling   If directed, apply ice to the injured area: ? If you have a  removable brace, remove it as told by your health care provider. ? Put ice in a plastic bag. ? Place a towel between your skin and the bag. ? Leave the ice on for 30 minutes every two hours at first. Then apply the ice as needed. Activity  Rest as told by your health care provider. ? Avoid sitting for a long time without moving. Get up to take short walks every 1-2 hours. This is important to improve blood flow and breathing. Ask for help if you feel weak or unsteady.  Return to your normal activities as directed by your health care provider. Ask what activities are safe for you.  Do exercises to improve motion and strength in your back (physical therapy), as recommended by your health care provider.  Exercise regularly as directed by your health care provider. General instructions   Do not drink alcohol. Alcohol can interfere with your treatment.  Do not use any products that contain nicotine or tobacco, such as cigarettes and e-cigarettes. These can delay bone healing. If you need help quitting, ask your health care provider.  Keep all follow-up visits as told by your health care provider. This is important. It can help to prevent permanent injury, disability, and long-lasting (chronic) pain. Contact a health care provider if:  You have a fever.  You develop a cough that makes your pain worse.  Your pain medicine is not helping.  Your pain does not get better over time.  You cannot return to your normal activities as planned or expected. Get help right away if:  Your pain is very bad and it suddenly gets worse.  You are unable to move any body part (paralysis) that is below the level of your injury.  You have numbness, tingling, or weakness in any body part that is below the level of your injury.  You cannot control your bladder or bowels. Summary  A spinal compression fracture is a collapse of the bones that form the spine (vertebrae).  With this type of fracture, the  vertebrae become pushed (compressed) into a wedge shape.  Your symptoms and treatment will depend on the cause and severity of the fracture and how quickly it develops.  Some fractures may heal on their own with supportive care. Fractures that cause the back to become misshapen, cause nerve pain or weakness, or do not respond to other treatment may be treated with surgery. This information is not intended to replace advice given to you by your health care provider. Make sure you discuss any questions you have with your health care provider. Document Released: 10/03/2005 Document Revised: 11/29/2018 Document Reviewed: 11/14/2017 Elsevier Patient Education  2020 [employer].    Balloon Kyphoplasty Balloon kyphoplasty is a procedure to treat a spinal compression fracture, which is a collapse of the bones that form the spine (vertebrae). With this type of fracture, the vertebrae are squeezed (compressed) into a wedge shape, and this causes pain. In this procedure, the collapsed vertebrae are expanded with  a balloon, and bone cement is injected into the vertebrae to strengthen them. Tell a health care provider about:  Any allergies you have.  All medicines you are taking, including vitamins, herbs, eye drops, creams, and over-the-counter medicines.  Any problems you or family members have had with anesthetic medicines.  Any blood disorders you have.  Any surgeries you have had.  Any medical conditions you have or have had.  Whether you are pregnant or may be pregnant. What are the risks? Generally, this is a safe procedure. However, problems may occur, including:  Infection.  Bleeding.  Increased back pain.  Allergic reactions to medicines.  Damage to other structures, nerves, or organs.  Leaking of bone cement into other parts of the body. What happens before the procedure? Medicines Ask your health care provider about:  Changing or stopping your regular medicines. This is  especially important if you are taking diabetes medicines or blood thinners.  Taking medicines such as aspirin and ibuprofen. These medicines can thin your blood. Do not take these medicines unless your health care provider tells you to take them.  Taking over-the-counter medicines, vitamins, herbs, and supplements. General instructions  Follow instructions from your health care provider about eating or drinking restrictions.  Do not use any products that contain nicotine or tobacco for at least 4 weeks before the procedure. These products include cigarettes, e-cigarettes, and chewing tobacco. If you need help quitting, ask your health care provider.  Ask your health care provider: ? How your surgery site will be marked. ? What steps will be taken to help prevent infection. These may include:  Removing hair at the surgery site.  Washing skin with a germ-killing soap.  Taking antibiotic medicine.  Plan to have someone take you home from the hospital or clinic.  If you will be going home right after the procedure, plan to have someone with you for 24 hours. What happens during the procedure?   An IV will be inserted into one of your veins.  You will be given one or more of the following: ? A medicine to help you relax (sedative). ? A medicine to numb the area (local anesthetic). ? A medicine to make you fall asleep (general anesthetic).  Your surgeon will use an X-ray machine to see your spinal compression fracture.  A hollow needle will be inserted through the skin and into the spine.  Through this needle, the balloon will be placed in your spine where the fractures are located.  The balloon will be inflated. This will create space and push the bone back toward its normal height and shape.  The balloon will be removed.  The newly created space in your spine will be filled with bone cement.  When the cement hardens, the hollow needle will be removed.  The needle puncture  site will be closed with skin glue or adhesive tape.  A bandage (dressing) may be used to cover the needle puncture site. The procedure may vary among health care providers and hospitals. What happens after the procedure?  Your blood pressure, heart rate, breathing rate, and blood oxygen level will be monitored until you leave the hospital or clinic.  You will have some pain. Pain medicine will be available to help you.  An ice pack may be placed over the needle site.  Do not drive for 24 hours if you were given a sedative during your procedure. Summary  Balloon kyphoplasty is a procedure to treat a spinal compression fracture, which  is a collapse of the bones that form the spine (vertebrae).  Before the procedure, follow instructions from your health care provider about eating or drinking restrictions.  Ask your health care provider about changing or stopping your regular medicines. This is especially important if you are taking diabetes medicines or blood thinners.  Plan to have someone take you home from the hospital or clinic. Do not drive for 24 hours if you were given a sedative during your procedure.  If you will be going home right after the procedure, plan to have someone with you for 24 hours. This information is not intended to replace advice given to you by your health care provider. Make sure you discuss any questions you have with your health care provider. Document Released: 09/08/2004 Document Revised: 09/10/2018 Document Reviewed: 09/10/2018 Elsevier Patient Education  2020 [employer].

## 2019-04-18 NOTE — Progress Notes (Signed)
[name] Rice is a 63 y.o. female who presents to [facility] today for pain in neck and back. Fell May 27th developed head and neck and back pain. Pain in neck and shoulders.  She notes pain and discomfort across the lower cervical spine or upper thoracic spine extending to her anterior lateral shoulders.  She notes pain with shoulder motion but denies severe weakness.  She has some pain in her hands as well but denies severe or new weakness into her hands bilateral knee. Additionally she notes pain into the mid thoracic back worse with motion.  She also has continued pain in her lumbar spine and pain radiating down her legs bilaterally.  This has not changed much in the last few months however.  She denies any new bowel or bladder dysfunction or new lower extremity weakness.  She has hydrocodone that she can take for pain.  She takes typically 1 pill a day.  She tried taking it a little more frequently which did help to control her pain some.  She notes the pain is now becoming somewhat disabling.  She is having trouble getting up and moving because it hurts so bad at times.  Symptoms can be severe.    ROS:  As above  Exam:  BP (!) 148/83   Pulse 79   Temp 98.2 F (36.8 C) (Oral)   Wt 270 lb (122.5 kg)   LMP 10/18/1995   BMI 47.83 kg/m  Wt Readings from Last 5 Encounters:  04/18/19 270 lb (122.5 kg)  01/31/19 274 lb (124.3 kg)  01/24/19 274 lb (124.3 kg)  12/13/18 270 lb (122.5 kg)  11/12/18 263 lb (119.3 kg)   General: Well Developed, well nourished, and in no acute distress.  Neuro/Psych: Alert and oriented x3, extra-ocular muscles intact, able to move all 4 extremities, sensation grossly intact. Skin: Warm and dry, no rashes noted.  Respiratory: Not using accessory muscles, speaking in full sentences, trachea midline.  Cardiovascular: Pulses palpable, no extremity edema. Abdomen: Does not appear distended. MSK:  C-spine: Tender  to palpation along spinal midline around C7 region.  Difficult to appreciate due to body habitus.  Normal cervical motion. Upper extremity strength is somewhat diminished to shoulder abduction due to pain and grip strength due to pain however intact otherwise.  Sensation is intact throughout bilateral extremities.  T-spine: Tender to palpation spinal midline around T1 region.  Also tender to palpation around T10 along midline.  Mildly tender palpation thoracic paraspinal musculature.  Decreased thoracic motion.  L-spine: Nontender to spinal midline.  Tender palpation bilateral lumbar paraspinal musculature.  Decreased lumbar motion. Lower extremity strength is intact.    Lab and Radiology Results X-ray images personally and independently reviewed.  C-spine: Degenerative changes present throughout cervical spine.  Difficult to appreciate C7 region due to body habitus.  No obvious acute fracture.  Possible compression fracture at C7/T1.  T-spine: Degenerative changes and osteopenia present.  Possible compression fracture at T1 however difficult to appreciate due to body habitus.  Also possible compression fracture at T10 mildly.  L-spine: Degenerative changes as well throughout lumbar spine.  No acute fractures visible.  Await formal radiology over read  Assessment and Plan: 63 y.o. female with fall with new significant pain along spine.  Patient is point tender around T1 and around T10.  X-rays today are concerning per my read for compression fractures in this area however her morbid obesity and body habitus make full evaluation for me  at least challenging.  I think a good neck step would likely be CT scan of her T-spine to further evaluate this.  However will await formal radiology over read first.  As for treatment of presumed compression fracture she is somewhat failing typical conservative management.  She will not tolerate TLSO brace especially due to her body habitus.  Plan to continue  hydrocodone for pain control.  Okay to increase dose if needed.  Discussed vertebroplasty/kyphoplasty.  Based on CT scan that may be a good option.  Check back likely via video visit after scans completed.   PDMP not reviewed this encounter. Orders Placed This Encounter  Procedures  . DG Cervical Spine Complete    Standing Status:   Future    Number of Occurrences:   1    Standing Expiration Date:   06/18/2020    Order Specific Question:   Reason for Exam (SYMPTOM  OR DIAGNOSIS REQUIRED)    Answer:   fell 1 month. Pain at C7 ttp and arm weakness    Order Specific Question:   Preferred imaging location?    Answer:   [facility]    Order Specific Question:   Radiology Contrast Protocol - do NOT remove file path    Answer:   \\[email or web address]s.pdf  . DG Thoracic Spine W/Swimmers    Standing Status:   Future    Number of Occurrences:   1    Standing Expiration Date:   06/18/2020    Order Specific Question:   Reason for Exam (SYMPTOM  OR DIAGNOSIS REQUIRED)    Answer:   fell 1 month ago. Pain spinal midline    Order Specific Question:   Preferred imaging location?    Answer:   [facility]    Order Specific Question:   Radiology Contrast Protocol - do NOT remove file path    Answer:   \\[email or web address]s.pdf  . DG Lumbar Spine Complete    Standing Status:   Future    Number of Occurrences:   1    Standing Expiration Date:   06/18/2020    Order Specific Question:   Reason for Exam (SYMPTOM  OR DIAGNOSIS REQUIRED)    Answer:   pain lspine after fall. Pain radiating both legs    Order Specific Question:   Preferred imaging location?    Answer:   [facility]    Order Specific Question:   Radiology Contrast Protocol - do NOT remove file path    Answer:   \\[email or web address]s.pdf   No orders of the defined types were placed in this encounter.   Historical  information moved to improve visibility of documentation.  Past Medical History:  Diagnosis Date  . Anxiety   . Arthritis   . Asthma    no PFTs  . Cervical dystonia   . COPD (chronic obstructive pulmonary disease) (HCC)    no PFTs  . Depression   . Diabetes mellitus   . Diarrhea   . Diverticulitis   . Diverticulosis   . Dizziness   . Gastroparesis   . GERD (gastroesophageal reflux disease)   . History of colonic polyps    hyperplastic  . History of kidney stones   . History of seborrheic dermatitis    face,ears  . HLD (hyperlipidemia)   . Hypertension   . Intussusception ([place])   . Lisfranc's sprain, left, initial encounter 10/06/2017  . Lumbar pain 2011   per x-ray - Multilevel spondylosis  . Morbid obesity ([place])   . Nephrolithiasis   .  OSA on CPAP 2008  . Pneumonia   . Recurrent boils    "SLUSTER BOILS" ON ABD AND LABIA  . Sleep apnea    USES C-PAP  . Torn rotator cuff RT SHOULDER  . Tremor CCENTRAL NERVOUS SYSTEM   Past Surgical History:  Procedure Laterality Date  . CARPAL TUNNEL RELEASE Left 2006  . ELBOW SURGERY Left   . HAND SURGERY Right RIGHT  . KNEE ARTHROSCOPY Left   . LUMBAR LAMINECTOMY    . MASS EXCISION  06/28/2012   Procedure: EXCISION MASS;  Surgeon: Harl Bowie, MD;  Location: [facility];  Service: General;  Laterality: Left;  excision left upper quadrant abdominal wall mass, Benign Lipoma  . NASAL SINUS SURGERY    . PILONIDAL CYST EXCISION  [id number]  . SUBACROMIAL DECOMPRESSION Right   . TUBAL LIGATION     Social History   Tobacco Use  . Smoking status: Former Smoker    Types: Cigarettes    Quit date: 10/17/1996    Years since quitting: 22.5  . Smokeless tobacco: Never Used  Substance Use Topics  . Alcohol use: No   family history includes Depression in her brother, brother, and sister; Diabetes in her brother, brother, mother, and sister; Heart attack in her brother and brother; Heart disease in her brother; Hyperlipidemia in her  brother, brother, mother, and sister; Hypertension in her brother, brother, mother, and sister; Kidney cancer in her brother; Kidney disease in her mother; Stroke in her sister.  Medications: Current Outpatient Medications  Medication Sig Dispense Refill  . AMBULATORY NON FORMULARY MEDICATION Test strips and needles for one touch Verio IQ glucometer.  Use to test blood sugar up to four times a day. Include all supplies needed including cartridge for lancets.  Diagnosis Type 2 Diabetes 100 each 3  . buPROPion (WELLBUTRIN XL) 150 MG 24 hr tablet TAKE 1 TABLET(150 MG) BY MOUTH DAILY 90 tablet 2  . DULoxetine (CYMBALTA) 60 MG capsule TAKE 2 CAPSULES BY MOUTH EVERY DAY 180 capsule 1  . famotidine (PEPCID) 20 MG tablet Take 1 tablet (20 mg total) by mouth 2 (two) times daily. 60 tablet 3  . fluticasone (FLONASE) 50 MCG/ACT nasal spray Place 2 sprays into both nostrils daily. 16 g 2  . furosemide (LASIX) 20 MG tablet Take 1 tablet (20 mg total) by mouth daily. 90 tablet 0  . gabapentin (NEURONTIN) 800 MG tablet Take 1 tablet (800 mg total) by mouth at bedtime. 90 tablet 0  . HYDROcodone-acetaminophen (NORCO/VICODIN) 5-325 MG tablet TAKE 1 TO 2 TABLETS BY MOUTH three times DAILY as needed 90 tablet 0  . hydrOXYzine (ATARAX/VISTARIL) 50 MG tablet Take 1 tablet (50 mg total) by mouth every 8 (eight) hours as needed. For itching. (Patient taking differently: Take 25 mg by mouth every 8 (eight) hours as needed. For itching.) 90 tablet 2  . Insulin Pen Needle (NOVOFINE) 32G X 6 MM MISC USE 3 TIMES A DAY WITH NOVOLOG 100 each 2  . levocetirizine (XYZAL) 5 MG tablet Take 1 tablet (5 mg total) by mouth 2 (two) times daily. 180 tablet 3  . liraglutide (VICTOZA) 18 MG/3ML SOPN ADMINISTER 1.8 MG UNDER THE SKIN DAILY 6 mL 0  . nystatin (MYCOSTATIN/NYSTOP) powder Apply topically 4 (four) times daily. 60 g 2  . nystatin ointment (MYCOSTATIN) apply to affected area twice a day 60 g 1  . pioglitazone (ACTOS) 30 MG tablet  TAKE 1 TABLET(30 MG) BY MOUTH DAILY 90 tablet 0  . pravastatin (  PRAVACHOL) 10 MG tablet Take 10 mg by mouth daily.    . propranolol (INDERAL) 20 MG tablet TAKE 1 TABLET BY MOUTH TWICE DAILY. (Patient taking differently: TAKE 1/2 TABLET BY MOUTH TWICE DAILY.) 180 tablet 0  . Insulin Glargine (LANTUS SOLOSTAR) 100 UNIT/ML Solostar Pen Inject 40 Units into the skin at bedtime. Please include QS 3 months. 36 mL 1   No current facility-administered medications for this visit.    Allergies  Allergen Reactions  . Ace Inhibitors Other (See Comments)    Causes blood pressure to drop rapidly  . Atrovent [Ipratropium] Swelling    Throat swelling on Atorvent Nasal Spray  . Cefaclor Hives and Itching  . Glyxambi [Empagliflozin-Linagliptin]     Itching/rash  . Shrimp [Shellfish Allergy] Nausea And Vomiting  . Farxiga [Dapagliflozin]     Itching/rash  . Ceclor [Cefaclor]   . Sulfa Antibiotics   . Sulfonamide Derivatives Rash      Discussed warning signs or symptoms. Please see discharge instructions. Patient expresses understanding.

## 2019-04-22 ENCOUNTER — Telehealth: Payer: Self-pay | Admitting: Family Medicine

## 2019-04-22 DIAGNOSIS — G8911 Acute pain due to trauma: Secondary | ICD-10-CM

## 2019-04-22 NOTE — Telephone Encounter (Signed)
CT scan ordered

## 2019-04-24 ENCOUNTER — Other Ambulatory Visit: Payer: Self-pay

## 2019-04-24 ENCOUNTER — Ambulatory Visit (INDEPENDENT_AMBULATORY_CARE_PROVIDER_SITE_OTHER): Payer: Medicare HMO

## 2019-04-24 DIAGNOSIS — G8911 Acute pain due to trauma: Secondary | ICD-10-CM

## 2019-04-24 DIAGNOSIS — M546 Pain in thoracic spine: Secondary | ICD-10-CM | POA: Diagnosis not present

## 2019-04-30 ENCOUNTER — Other Ambulatory Visit: Payer: Self-pay | Admitting: Neurology

## 2019-04-30 ENCOUNTER — Other Ambulatory Visit: Payer: Self-pay | Admitting: Physician Assistant

## 2019-04-30 DIAGNOSIS — E1142 Type 2 diabetes mellitus with diabetic polyneuropathy: Secondary | ICD-10-CM

## 2019-04-30 MED ORDER — ONETOUCH VERIO VI STRP: ORAL_STRIP | 1 refills | Status: DC

## 2019-05-06 ENCOUNTER — Telehealth: Payer: Self-pay

## 2019-05-06 ENCOUNTER — Encounter: Payer: Self-pay | Admitting: Family Medicine

## 2019-05-06 DIAGNOSIS — R69 Illness, unspecified: Secondary | ICD-10-CM | POA: Diagnosis not present

## 2019-05-06 MED ORDER — PREDNISONE 50 MG PO TABS: ORAL_TABLET | ORAL | 0 refills | Status: DC

## 2019-05-06 MED ORDER — HYDROCODONE-ACETAMINOPHEN 10-325 MG PO TABS: 1.0000 | ORAL_TABLET | Freq: Three times a day (TID) | ORAL | 0 refills | Status: DC | PRN

## 2019-05-06 NOTE — Telephone Encounter (Signed)
Adding prednisone, increasing narcotic dose.

## 2019-05-06 NOTE — Telephone Encounter (Signed)
Patient called back after sending a message through [place]. Dr Dianah Field did respond. She has agreed to the prednisone and increase in pain medication.      Rachael Fee to Gregor Hams, MD      05/06/19 9:32 AM Dr. Georgina Snell, my pain is not any better and I'm having trouble sleeping. Should I continue to just wait this out?

## 2019-05-06 NOTE — Telephone Encounter (Signed)
See telephone note.

## 2019-05-07 NOTE — Telephone Encounter (Signed)
Left message advising of recommendations.  

## 2019-06-10 ENCOUNTER — Telehealth: Payer: Self-pay | Admitting: Physician Assistant

## 2019-06-10 ENCOUNTER — Other Ambulatory Visit: Payer: Self-pay | Admitting: Family Medicine

## 2019-06-10 DIAGNOSIS — H8110 Benign paroxysmal vertigo, unspecified ear: Secondary | ICD-10-CM

## 2019-06-10 DIAGNOSIS — E1140 Type 2 diabetes mellitus with diabetic neuropathy, unspecified: Secondary | ICD-10-CM

## 2019-06-10 DIAGNOSIS — Z794 Long term (current) use of insulin: Secondary | ICD-10-CM

## 2019-06-10 NOTE — Telephone Encounter (Signed)
Needs appt

## 2019-06-10 NOTE — Telephone Encounter (Signed)
Tried calling patient and has no voicemail. kG LPN

## 2019-06-13 ENCOUNTER — Encounter: Payer: Self-pay | Admitting: Physician Assistant

## 2019-06-13 DIAGNOSIS — Z794 Long term (current) use of insulin: Secondary | ICD-10-CM

## 2019-06-13 DIAGNOSIS — E1140 Type 2 diabetes mellitus with diabetic neuropathy, unspecified: Secondary | ICD-10-CM

## 2019-06-14 MED ORDER — MECLIZINE HCL 25 MG PO TABS: 25.0000 mg | ORAL_TABLET | Freq: Three times a day (TID) | ORAL | 0 refills | Status: DC | PRN

## 2019-06-14 MED ORDER — GABAPENTIN 800 MG PO TABS: 800.0000 mg | ORAL_TABLET | Freq: Every day | ORAL | 0 refills | Status: DC

## 2019-06-14 NOTE — Telephone Encounter (Signed)
Please trying call pt again for appt. Ok to refill for one month if can schedule.

## 2019-06-14 NOTE — Telephone Encounter (Signed)
Sept 1 4 and 4:20 slot

## 2019-06-14 NOTE — Telephone Encounter (Signed)
Janett Billow - please call patient for appt.

## 2019-06-14 NOTE — Telephone Encounter (Signed)
Patient wants something next week, after 3:30PM and she is a 40 minute patient. Not showing appropriate time slots. Please advise.

## 2019-06-14 NOTE — Telephone Encounter (Signed)
Appointment has been made. No further questions.  °

## 2019-06-14 NOTE — Telephone Encounter (Signed)
Left patient a voicemail with information below. Let patient know to call us back to schedule an appointment in the office or a virtual visit.  °

## 2019-06-15 DIAGNOSIS — J449 Chronic obstructive pulmonary disease, unspecified: Secondary | ICD-10-CM | POA: Diagnosis not present

## 2019-06-15 DIAGNOSIS — G4733 Obstructive sleep apnea (adult) (pediatric): Secondary | ICD-10-CM | POA: Diagnosis not present

## 2019-06-18 ENCOUNTER — Ambulatory Visit (INDEPENDENT_AMBULATORY_CARE_PROVIDER_SITE_OTHER): Payer: Medicare HMO | Admitting: Physician Assistant

## 2019-06-18 ENCOUNTER — Encounter: Payer: Self-pay | Admitting: Physician Assistant

## 2019-06-18 ENCOUNTER — Other Ambulatory Visit: Payer: Self-pay

## 2019-06-18 VITALS — BP 125/49 | HR 61 | Temp 97.8°F | Ht 63.0 in | Wt 266.0 lb

## 2019-06-18 DIAGNOSIS — E1140 Type 2 diabetes mellitus with diabetic neuropathy, unspecified: Secondary | ICD-10-CM | POA: Diagnosis not present

## 2019-06-18 DIAGNOSIS — R0602 Shortness of breath: Secondary | ICD-10-CM

## 2019-06-18 DIAGNOSIS — E118 Type 2 diabetes mellitus with unspecified complications: Secondary | ICD-10-CM

## 2019-06-18 DIAGNOSIS — I10 Essential (primary) hypertension: Secondary | ICD-10-CM

## 2019-06-18 DIAGNOSIS — Z1159 Encounter for screening for other viral diseases: Secondary | ICD-10-CM

## 2019-06-18 DIAGNOSIS — Z794 Long term (current) use of insulin: Secondary | ICD-10-CM

## 2019-06-18 DIAGNOSIS — L304 Erythema intertrigo: Secondary | ICD-10-CM | POA: Diagnosis not present

## 2019-06-18 DIAGNOSIS — Z23 Encounter for immunization: Secondary | ICD-10-CM

## 2019-06-18 DIAGNOSIS — E782 Mixed hyperlipidemia: Secondary | ICD-10-CM

## 2019-06-18 DIAGNOSIS — R42 Dizziness and giddiness: Secondary | ICD-10-CM

## 2019-06-18 DIAGNOSIS — F331 Major depressive disorder, recurrent, moderate: Secondary | ICD-10-CM

## 2019-06-18 DIAGNOSIS — H8110 Benign paroxysmal vertigo, unspecified ear: Secondary | ICD-10-CM | POA: Diagnosis not present

## 2019-06-18 DIAGNOSIS — R69 Illness, unspecified: Secondary | ICD-10-CM | POA: Diagnosis not present

## 2019-06-18 DIAGNOSIS — E1142 Type 2 diabetes mellitus with diabetic polyneuropathy: Secondary | ICD-10-CM | POA: Diagnosis not present

## 2019-06-18 LAB — POCT GLYCOSYLATED HEMOGLOBIN (HGB A1C): Hemoglobin A1C: 8.3 % — AB (ref 4.0–5.6)

## 2019-06-18 LAB — POCT UA - MICROALBUMIN
Albumin/Creatinine Ratio, Urine, POC: <30
Creatinine, POC: 50 mg/dL
Microalbumin Ur, POC: 30 mg/L

## 2019-06-18 MED ORDER — LANTUS SOLOSTAR 100 UNIT/ML ~~LOC~~ SOPN: 40.0000 [IU] | PEN_INJECTOR | Freq: Every day | SUBCUTANEOUS | 1 refills | Status: DC

## 2019-06-18 MED ORDER — NYSTATIN 100000 UNIT/GM EX POWD: Freq: Four times a day (QID) | CUTANEOUS | 2 refills | Status: DC

## 2019-06-18 MED ORDER — NYSTATIN 100000 UNIT/GM EX OINT: TOPICAL_OINTMENT | CUTANEOUS | 1 refills | Status: DC

## 2019-06-18 NOTE — Progress Notes (Signed)
Subjective:    Patient ID: [name] Rice, female    DOB: 04-Aug-1956, 63 y.o.   MRN: [id number]  HPI  Pt is a 63 yo obese female with T2DM, diabetic neuropathy, HLD, MDD who presents to the clinic for follow up.   Pt admits that she has not been compliant with diabetic medications for the last few months. She reports "forgetting". She has not taken lantus at all. She denies any hypoglycemic events. She is not really checking her sugars either. She denies any diabetic diet but tries to "not eat too many sweets". She has ongoing neuropathy. Her balance is off. She denies any open sores or wounds. Denies CP, palpitations, headaches or vision changes. She is having off and on problems with red rash in her skin folds. Yeast creams and powders help. She needs refills.   She is having more and more vertigo. She finds it hard to do epley manuevers at home by herself. She has responded to vestibular rehab before. She is taking antivert regularly. She notices it more when laying down or she just turns her head into a specific position. Her gait has not changed yet.   .. Active Ambulatory Problems    Diagnosis Date Noted  . Type II or unspecified type diabetes mellitus with neurological manifestations, not stated as uncontrolled(250.60) 08/08/2006  . DYSLIPIDEMIA 08/08/2006  . Depression with anxiety 01/28/2008  . Hypertension associated with diabetes ([place]) 08/08/2006  . GERD 08/08/2006  . Acute bilateral low back pain 11/27/2007  . BPPV (benign paroxysmal positional vertigo), unspecified laterality 07/14/2009  . OSA on CPAP   . Insomnia 02/06/2011  . Preventative health care 08/16/2011  . History of allergic rhinitis 09/07/2011  . Iron deficiency anemia 03/29/2012  . Lipoma of abdominal wall 05/01/2012  . Intestinal bacterial overgrowth 09/20/2012  . SOB (shortness of breath) on exertion 03/31/2013  . Hepatic steatosis 09/26/2013  . Diverticulitis 11/27/2013  . Diabetic peripheral neuropathy  ([place]) 05/30/2014  . Gastroparesis due to secondary diabetes ([place]) 09/16/2014  . Ear pain 12/09/2014  . Diarrhea 01/12/2015  . Non-proliferative diabetic retinopathy, right eye ([place]) 11/23/2015  . Chronic pain syndrome 12/15/2015  . Morbid obesity due to excess calories ([place]) 02/16/2016  . Right shoulder pain 02/16/2016  . Unstable balance 02/16/2016  . Intertrigo 03/15/2016  . Left knee pain 03/15/2016  . Post-menopausal 03/16/2016  . Acute reaction to stress 03/28/2016  . Dystonia 09/21/2016  . Neck pain 07/11/2017  . Lisfranc's dislocation, right, initial encounter 10/06/2017  . Freiberg's disease, right 10/06/2017  . Vasomotor rhinitis 11/28/2017  . Ear itching 01/17/2018  . No energy 01/17/2018  . Hordeolum externum of right lower eyelid 02/16/2018  . Hot flashes 02/16/2018  . Restrictive lung disease 04/12/2018  . Mixed hyperlipidemia 04/12/2018  . Vitamin D insufficiency 04/12/2018  . Drug-induced constipation 05/23/2018  . Calcification and ossification of muscle 07/13/2018  . Pressure injury of right foot, unstageable ([place]) 09/25/2018  . Bilateral carpal tunnel syndrome 01/24/2019  . Type 2 diabetes mellitus with diabetic neuropathy, with long-term current use of insulin ([place]) 06/20/2019  . Dizziness 06/20/2019   Resolved Ambulatory Problems    Diagnosis Date Noted  . IRON DEFICIENCY 01/30/2007  . DEPRESSION 08/08/2006  . TORTICOLLIS, SPASMODIC 02/13/2007  . SINUSITIS, ACUTE 05/27/2009  . ALLERGIC RHINITIS 12/25/2006  . ASTHMA 01/28/2008  . COPD 08/08/2006  . DIVERTICULOSIS, COLON 02/12/2003  . DERMATITIS, SEBORRHEIC 01/30/2007  . GOUT, HX OF 08/08/2006  . PERSONAL HX COLONIC POLYPS 09/16/2008  .  NEPHROLITHIASIS, HX OF 01/28/2008  . Calf pain 11/29/2010  . Tremor, essential 02/06/2011  . Seborrheic dermatitis, unspecified 04/06/2011  . Finger pain 04/06/2011  . Shoulder pain, right 08/16/2011  . Left leg pain 04/10/2012  . Right ear pain 05/01/2012  .  Seborrheic dermatitis 05/01/2012  . Hypoglycemia 05/01/2012  . Vaginal candidiasis 08/22/2012  . Sigmoid diverticulitis 09/15/2012  . Acute-on-chronic kidney disease Stage II 09/15/2012  . Cyst, breast 03/01/2013  . Lactic acidosis 03/31/2013  . Acute renal insufficiency 03/31/2013  . Abdominal pain, left lower quadrant 04/29/2013  . Gout 05/01/2013  . COPD (chronic obstructive pulmonary disease) with chronic bronchitis ([place]) 12/09/2014  . Type 2 diabetes, controlled, with peripheral neuropathy ([place]) 12/09/2014  . Fall 03/16/2016  . Foot fracture, right, closed, initial encounter 08/23/2017   Past Medical History:  Diagnosis Date  . Anxiety   . Arthritis   . Asthma   . Cervical dystonia   . COPD (chronic obstructive pulmonary disease) ([place])   . Depression   . Diabetes mellitus   . Diverticulosis   . Gastroparesis   . GERD (gastroesophageal reflux disease)   . History of colonic polyps   . History of kidney stones   . History of seborrheic dermatitis   . HLD (hyperlipidemia)   . Hypertension   . Intussusception ([place])   . Lisfranc's sprain, left, initial encounter 10/06/2017  . Lumbar pain 2011  . Morbid obesity ([place])   . Nephrolithiasis   . Pneumonia   . Recurrent boils   . Sleep apnea   . Torn rotator cuff RT SHOULDER  . Tremor [place]     Review of Systems  All other systems reviewed and are negative.      Objective:   Physical Exam Vitals signs reviewed.  Constitutional:      Appearance: Normal appearance. She is obese.  HENT:     Head: Normocephalic.     Nose: Nose normal.  Eyes:     Extraocular Movements: Extraocular movements intact.     Conjunctiva/sclera: Conjunctivae normal.     Pupils: Pupils are equal, round, and reactive to light.  Cardiovascular:     Rate and Rhythm: Normal rate and regular rhythm.  Pulmonary:     Effort: Pulmonary effort is normal.     Breath sounds: Normal breath sounds.  Neurological:     General: No  focal deficit present.     Mental Status: She is alert and oriented to person, place, and time.     Comments: Not able to get on table to do dix hallipike.   Psychiatric:        Mood and Affect: Mood normal.           Assessment & Plan:  Marland KitchenMarland KitchenGeorgena was seen today for diabetes.  Diagnoses and all orders for this visit:  Type 2 diabetes mellitus with diabetic neuropathy, with long-term current use of insulin (HCC) -     POCT glycosylated hemoglobin (Hb A1C) -     POCT UA - Microalbumin -     Insulin Glargine (LANTUS SOLOSTAR) 100 UNIT/ML Solostar Pen; Inject 40 Units into the skin at bedtime. Please include QS 3 months. -     COMPLETE METABOLIC PANEL WITH GFR -     AMBULATORY NON FORMULARY MEDICATION; One pair of diabetic shoes. Dx. Diabetic neuropathy.  Need for vaccination -     Flu Vaccine QUAD 36+ mos IM -     Pneumococcal polysaccharide vaccine 23-valent greater than or equal  to 2yo subcutaneous/IM  Intertrigo -     nystatin ointment (MYCOSTATIN); apply to affected area twice a day -     nystatin (MYCOSTATIN/NYSTOP) powder; Apply topically 4 (four) times daily.  Mixed hyperlipidemia -     Lipid Panel w/reflex Direct LDL  Need for hepatitis C screening test -     Hepatitis C Antibody  Dizziness -     COMPLETE METABOLIC PANEL WITH GFR -     TSH -     CBC w/Diff/Platelet -     Ambulatory referral to Physical Therapy  BPPV (benign paroxysmal positional vertigo), unspecified laterality -     Ambulatory referral to Physical Therapy   .Marland Kitchen Lab Results  Component Value Date   HGBA1C 8.3 (A) 06/18/2019    A1C not controlled.  Discussed with patient importance of taking medication and risk of not.  Pt agrees to be more compliant.  Refills given.  On STATIN. Recheck lipids. BP at goal under 130/90. Normal mircoalbumin. Eye exam UTD.  Pneumonia and flu shot given today.   .. Diabetic Foot Exam - Simple   Simple Foot Form Diabetic Foot exam was performed with the  following findings: Yes 06/18/2019  4:21 PM  Visual Inspection No deformities, no ulcerations, no other skin breakdown bilaterally: Yes See comments: Yes Sensation Testing See comments: Yes Pulse Check Posterior Tibialis and Dorsalis pulse intact bilaterally: Yes Comments Right foot callous on big toe and inside of foot, sensation absent in all areas except big toe.  Left foot normal except sensation of feeling absent on all areas but top of foot     rx given for diabetic shoes. Could help ambulation and decrease fall risk.   Will make referral for vestibular rehab. antivert not helping. Not able to do epley at home. Hx of benefit with vestibular rehab.

## 2019-06-18 NOTE — Patient Instructions (Signed)
Will make referral to PT for vestibular rehab.  Start lantus and ictoza.

## 2019-06-19 LAB — CBC WITH DIFFERENTIAL/PLATELET
Absolute Monocytes: 372 cells/uL (ref 200–950)
Basophils Absolute: 38 cells/uL (ref 0–200)
Basophils Relative: 0.6 %
Eosinophils Absolute: 183 cells/uL (ref 15–500)
Eosinophils Relative: 2.9 %
HCT: 37.5 % (ref 35.0–45.0)
Hemoglobin: 12.2 g/dL (ref 11.7–15.5)
Lymphs Abs: 1833 cells/uL (ref 850–3900)
MCH: 29.3 pg (ref 27.0–33.0)
MCHC: 32.5 g/dL (ref 32.0–36.0)
MCV: 90.1 fL (ref 80.0–100.0)
MPV: 11 fL (ref 7.5–12.5)
Monocytes Relative: 5.9 %
Neutro Abs: 3875 cells/uL (ref 1500–7800)
Neutrophils Relative %: 61.5 %
Platelets: 210 10*3/uL (ref 140–400)
RBC: 4.16 10*6/uL (ref 3.80–5.10)
RDW: 13.1 % (ref 11.0–15.0)
Total Lymphocyte: 29.1 %
WBC: 6.3 10*3/uL (ref 3.8–10.8)

## 2019-06-19 LAB — COMPLETE METABOLIC PANEL WITH GFR
AG Ratio: 1.3 (calc) (ref 1.0–2.5)
ALT: 8 U/L (ref 6–29)
AST: 12 U/L (ref 10–35)
Albumin: 3.6 g/dL (ref 3.6–5.1)
Alkaline phosphatase (APISO): 86 U/L (ref 37–153)
BUN/Creatinine Ratio: 22 (calc) (ref 6–22)
BUN: 28 mg/dL — ABNORMAL HIGH (ref 7–25)
CO2: 26 mmol/L (ref 20–32)
Calcium: 9.1 mg/dL (ref 8.6–10.4)
Chloride: 98 mmol/L (ref 98–110)
Creat: 1.28 mg/dL — ABNORMAL HIGH (ref 0.50–0.99)
GFR, Est African American: 51 mL/min/{1.73_m2} — ABNORMAL LOW (ref >=60)
GFR, Est Non African American: 44 mL/min/{1.73_m2} — ABNORMAL LOW (ref >=60)
Globulin: 2.8 g/dL (calc) (ref 1.9–3.7)
Glucose, Bld: 217 mg/dL — ABNORMAL HIGH (ref 65–99)
Potassium: 4.2 mmol/L (ref 3.5–5.3)
Sodium: 138 mmol/L (ref 135–146)
Total Bilirubin: 0.5 mg/dL (ref 0.2–1.2)
Total Protein: 6.4 g/dL (ref 6.1–8.1)

## 2019-06-19 LAB — HEPATITIS C ANTIBODY
Hepatitis C Ab: NONREACTIVE
SIGNAL TO CUT-OFF: 0.01 (ref <1.00)

## 2019-06-19 LAB — LIPID PANEL W/REFLEX DIRECT LDL
Cholesterol: 136 mg/dL (ref <200)
HDL: 45 mg/dL — ABNORMAL LOW (ref >=50)
LDL Cholesterol (Calc): 70 mg/dL (calc)
Non-HDL Cholesterol (Calc): 91 mg/dL (calc) (ref <130)
Total CHOL/HDL Ratio: 3 (calc) (ref <5.0)
Triglycerides: 129 mg/dL (ref <150)

## 2019-06-19 LAB — TSH: TSH: 1.97 mIU/L (ref 0.40–4.50)

## 2019-06-19 NOTE — Progress Notes (Signed)
Cholesterol looks really good. TG look good. HDL goal is to increase a little.  Thyroid looks good.  Hemoglobin looks better.  Liver looks great.  As suspected sugar up some.  Kidney function has declined a bit from last check. Are you taking any OTC motrin/aleve/goodys, are you drinking less than you normally do? We do need to recheck in 2-3 weeks to see if trending down or up.

## 2019-06-20 ENCOUNTER — Encounter: Payer: Self-pay | Admitting: Physician Assistant

## 2019-06-20 DIAGNOSIS — Z794 Long term (current) use of insulin: Secondary | ICD-10-CM | POA: Insufficient documentation

## 2019-06-20 DIAGNOSIS — E1140 Type 2 diabetes mellitus with diabetic neuropathy, unspecified: Secondary | ICD-10-CM | POA: Insufficient documentation

## 2019-06-20 DIAGNOSIS — R42 Dizziness and giddiness: Secondary | ICD-10-CM | POA: Insufficient documentation

## 2019-06-20 MED ORDER — AMBULATORY NON FORMULARY MEDICATION: 0 refills | Status: DC

## 2019-06-20 MED ORDER — FUROSEMIDE 20 MG PO TABS: 20.0000 mg | ORAL_TABLET | Freq: Two times a day (BID) | ORAL | 1 refills | Status: DC

## 2019-06-20 MED ORDER — PROPRANOLOL HCL 20 MG PO TABS: ORAL_TABLET | ORAL | 1 refills | Status: DC

## 2019-06-20 MED ORDER — GABAPENTIN 800 MG PO TABS: 800.0000 mg | ORAL_TABLET | Freq: Two times a day (BID) | ORAL | 3 refills | Status: DC

## 2019-06-20 MED ORDER — VICTOZA 18 MG/3ML ~~LOC~~ SOPN: PEN_INJECTOR | SUBCUTANEOUS | 1 refills | Status: DC

## 2019-06-20 MED ORDER — PRAVASTATIN SODIUM 10 MG PO TABS: 10.0000 mg | ORAL_TABLET | Freq: Every day | ORAL | 3 refills | Status: DC

## 2019-06-20 MED ORDER — DULOXETINE HCL 60 MG PO CPEP: 120.0000 mg | ORAL_CAPSULE | Freq: Every day | ORAL | 1 refills | Status: DC

## 2019-06-20 MED ORDER — FAMOTIDINE 20 MG PO TABS: 20.0000 mg | ORAL_TABLET | Freq: Two times a day (BID) | ORAL | 1 refills | Status: DC

## 2019-06-20 MED ORDER — PIOGLITAZONE HCL 30 MG PO TABS: ORAL_TABLET | ORAL | 1 refills | Status: DC

## 2019-06-20 MED ORDER — BUPROPION HCL ER (XL) 150 MG PO TB24: ORAL_TABLET | ORAL | 2 refills | Status: DC

## 2019-06-24 ENCOUNTER — Encounter: Payer: Self-pay | Admitting: Physician Assistant

## 2019-06-25 MED ORDER — TRESIBA FLEXTOUCH 200 UNIT/ML ~~LOC~~ SOPN: 40.0000 [IU] | PEN_INJECTOR | Freq: Every day | SUBCUTANEOUS | 5 refills | Status: DC

## 2019-06-25 NOTE — Telephone Encounter (Signed)
34%* Basaglar KwikPen U-100 Insulin tier-2 NOT Required*  47%* Levemir FlexTouch U-100 Insuln tier-3 NOT Required*  41%* Tresiba FlexTouch U-200 tier-4 NOT Required*  41%* Levemir U-100 Insulin tier-5 NOT Required*  I got a message about the PA. Has the patient been on any other the above listed. There is a chance the patient will not get approved for Lantus. Please advise.

## 2019-06-25 NOTE — Telephone Encounter (Signed)
lantus solostar 100unit/ml  CoverMyMeds Key: [employer] [facility] [id number]

## 2019-06-27 ENCOUNTER — Encounter: Payer: Self-pay | Admitting: Physical Therapy

## 2019-06-27 ENCOUNTER — Other Ambulatory Visit: Payer: Self-pay

## 2019-06-27 ENCOUNTER — Ambulatory Visit: Payer: Medicare HMO | Admitting: Physical Therapy

## 2019-06-27 DIAGNOSIS — R42 Dizziness and giddiness: Secondary | ICD-10-CM

## 2019-06-27 MED ORDER — HYDROCODONE-ACETAMINOPHEN 10-325 MG PO TABS: 1.0000 | ORAL_TABLET | Freq: Three times a day (TID) | ORAL | 0 refills | Status: DC | PRN

## 2019-06-27 NOTE — Therapy (Signed)
[facility] [facility] [address] [facility] [place], [place], [address] Phone: [phone]   Fax:  [phone]  Physical Therapy Evaluation  Patient Details  Name: [name] Rice MRN: [id number] Date of Birth: 25-Jul-1956 Referring Provider (PT): Donella Stade, [place]   Encounter Date: 06/27/2019  PT End of Session - 06/27/19 1300    Visit Number  1    Date for PT Re-Evaluation  07/25/19    PT Start Time  1146    PT Stop Time  1216    PT Time Calculation (min)  30 min    Activity Tolerance  Patient tolerated treatment well    Behavior During Therapy  [facility] for tasks assessed/performed       Past Medical History:  Diagnosis Date  . Anxiety   . Arthritis   . Asthma    no PFTs  . Cervical dystonia   . COPD (chronic obstructive pulmonary disease) (HCC)    no PFTs  . Depression   . Diabetes mellitus   . Diarrhea   . Diverticulitis   . Diverticulosis   . Dizziness   . Gastroparesis   . GERD (gastroesophageal reflux disease)   . History of colonic polyps    hyperplastic  . History of kidney stones   . History of seborrheic dermatitis    face,ears  . HLD (hyperlipidemia)   . Hypertension   . Intussusception ([place])   . Lisfranc's sprain, left, initial encounter 10/06/2017  . Lumbar pain 2011   per x-ray - Multilevel spondylosis  . Morbid obesity ([place])   . Nephrolithiasis   . OSA on CPAP 2008  . Pneumonia   . Recurrent boils    "SLUSTER BOILS" ON ABD AND LABIA  . Sleep apnea    USES C-PAP  . Torn rotator cuff RT SHOULDER  . Tremor CCENTRAL NERVOUS SYSTEM    Past Surgical History:  Procedure Laterality Date  . CARPAL TUNNEL RELEASE Left 2006  . ELBOW SURGERY Left   . HAND SURGERY Right RIGHT  . KNEE ARTHROSCOPY Left   . LUMBAR LAMINECTOMY    . MASS EXCISION  06/28/2012   Procedure: EXCISION MASS;  Surgeon: Harl Bowie, MD;  Location: [place];  Service: General;  Laterality: Left;  excision left upper quadrant  abdominal wall mass, Benign Lipoma  . NASAL SINUS SURGERY    . PILONIDAL CYST EXCISION  [id number]  . SUBACROMIAL DECOMPRESSION Right   . TUBAL LIGATION      There were no vitals filed for this visit.   Subjective Assessment - 06/27/19 1152    Subjective  Pt is a 63 y/o female who presents to OPPT for hx of BPPV.  Pt reports daughter is usually able to perform maneuver and symptoms will clear.  Pt states on 03/13/19 she had a fall hitting her head on a wooden chair.  Pt has had grandson trying to do maneuver but he's been unsuccessful, and reports symptoms are "different"  She c/o pain in Rt ear with leaning to Rt.    Patient Stated Goals  improve dizziness    Currently in Pain?  No/denies   reports chronic pain -unrelated to referral        [facility] PT Assessment - 06/27/19 1155      Assessment   Medical Diagnosis  BPPV    Referring Provider (PT)  Donella Stade, PA-C    Onset Date/Surgical Date  03/13/19    Hand Dominance  Right  Next MD Visit  07/08/2019    Prior Therapy  for BPPV-taught epley's      Precautions   Precautions  Fall      Restrictions   Weight Bearing Restrictions  No      Balance Screen   Has the patient fallen in the past 6 months  Yes    How many times?  3    Has the patient had a decrease in activity level because of a fear of falling?   Yes    Is the patient reluctant to leave their home because of a fear of falling?   Yes      [place] residence    Living Arrangements  Other relatives   grandson (26 y/o)   Available Help at Discharge  Family    Type of [place] entrance    [place]  One level    [place] - 4 wheels    Additional Comments  handicap accessible apartment      Prior Function   Level of Independence  Independent    Vocation  On disability    Leisure  eat and watch TV; no regular exercise      Cognition   Overall Cognitive Status  Within  Functional Limits for tasks assessed      Ambulation/Gait   Gait Comments  pt amb with rollator; reports she is at baseline for mobility           Vestibular Assessment - 06/27/19 1158      Vestibular Assessment   General Observation  no symptoms at rest      Symptom Behavior   Subjective history of current problem  see subjective    Type of Dizziness   "Funny feeling in head";Imbalance;Spinning   headache   Frequency of Dizziness  "that's not happening now"     Duration of Dizziness  seconds - "everytime I let go of my walker"    Symptom Nature  Motion provoked;Spontaneous;Variable    Aggravating Factors  --   falling; letting go of walker   Relieving Factors  Comments   holding still to walker   Progression of Symptoms  Better    History of similar episodes  hx of BPPV      Oculomotor Exam   Oculomotor Alignment  Normal    Smooth Pursuits  Intact    Saccades  Intact   with symptoms     Oculomotor Exam-Fixation Suppressed    Left Head Impulse  deferred due to neck pain    Right Head Impulse  deferred due to neck pain      Vestibulo-Ocular Reflex   VOR 1 Head Only (x 1 viewing)  intact; neck pain limiting exam      Positional Testing   Sidelying Test  Sidelying Right;Sidelying Left    Horizontal Canal Testing  Horizontal Canal Right;Horizontal Canal Left      Sidelying Right   Sidelying Right Duration  none    Sidelying Right Symptoms  No nystagmus      Sidelying Left   Sidelying Left Duration  none    Sidelying Left Symptoms  No nystagmus      Horizontal Canal Right   Horizontal Canal Right Duration  none    Horizontal Canal Right Symptoms  Normal      Horizontal Canal Left   Horizontal Canal Left Duration  none  Horizontal Canal Left Symptoms  Normal          Objective measurements completed on examination: See above findings.                   PT Long Term Goals - 06/27/19 1304      PT LONG TERM GOAL #1   Title  to be  determined if pt returns             Plan - 06/27/19 1300    Clinical Impression Statement  Pt is a 64 y/o female who presents to [place] for vertigo following fall on 03/13/19.  Unable to provoke symptoms today, and pt reports being symptom free and back to baseline x 1 week.  Symptoms likely consistent with BPPV, and cannot rule out post concussive symptoms as well.  At this time since pt is at baseline, no further indication for PT intervention.  Pt to call if symptoms return within 30 days, otherwise will plan to d/c.    Personal Factors and Comorbidities  Comorbidity 3+;Past/Current Experience    Comorbidities  obesity, HTN, chronic pain, DM, depression, anxiety, COPD, asthma, arthritis    Examination-Activity Limitations  Bed Mobility;Locomotion Level;Transfers;Stand    Stability/Clinical Decision Making  Stable/Uncomplicated    Clinical Decision Making  Low    Rehab Potential  Good    PT Frequency  1x / week   will see PRN   PT Duration  4 weeks    PT Treatment/Interventions  ADLs/Self Care Home Management;Canalith Repostioning;Balance training;Therapeutic exercise;Therapeutic activities;Functional mobility training;Gait training;Neuromuscular re-education;Patient/family education;Vestibular    PT Next Visit Plan  reassess if pt returns, otherwise d/c    Consulted and Agree with Plan of Care  Patient       Patient will benefit from skilled therapeutic intervention in order to improve the following deficits and impairments:  Dizziness  Visit Diagnosis: Dizziness and giddiness - Plan: PT plan of care cert/re-cert     Problem List Patient Active Problem List   Diagnosis Date Noted  . Type 2 diabetes mellitus with diabetic neuropathy, with long-term current use of insulin ([place]) 06/20/2019  . Dizziness 06/20/2019  . Bilateral carpal tunnel syndrome 01/24/2019  . Pressure injury of right foot, unstageable ([place]) 09/25/2018  . Calcification and ossification of muscle 07/13/2018   . Drug-induced constipation 05/23/2018  . Restrictive lung disease 04/12/2018  . Mixed hyperlipidemia 04/12/2018  . Vitamin D insufficiency 04/12/2018  . Hordeolum externum of right lower eyelid 02/16/2018  . Hot flashes 02/16/2018  . Ear itching 01/17/2018  . No energy 01/17/2018  . Vasomotor rhinitis 11/28/2017  . Lisfranc's dislocation, right, initial encounter 10/06/2017  . Freiberg's disease, right 10/06/2017  . Neck pain 07/11/2017  . Dystonia 09/21/2016  . Acute reaction to stress 03/28/2016  . Post-menopausal 03/16/2016  . Intertrigo 03/15/2016  . Left knee pain 03/15/2016  . Morbid obesity due to excess calories ([place]) 02/16/2016  . Right shoulder pain 02/16/2016  . Unstable balance 02/16/2016  . Chronic pain syndrome 12/15/2015  . Non-proliferative diabetic retinopathy, right eye ([place]) 11/23/2015  . Diarrhea 01/12/2015  . Ear pain 12/09/2014  . Gastroparesis due to secondary diabetes ([place]) 09/16/2014  . Diabetic peripheral neuropathy ([place]) 05/30/2014  . Diverticulitis 11/27/2013  . Hepatic steatosis 09/26/2013  . SOB (shortness of breath) on exertion 03/31/2013  . Intestinal bacterial overgrowth 09/20/2012  . Lipoma of abdominal wall 05/01/2012  . Iron deficiency anemia 03/29/2012  . History of allergic rhinitis 09/07/2011  . Preventative health care  08/16/2011  . Insomnia 02/06/2011  . OSA on CPAP   . BPPV (benign paroxysmal positional vertigo), unspecified laterality 07/14/2009  . Depression with anxiety 01/28/2008  . Acute bilateral low back pain 11/27/2007  . Type II or unspecified type diabetes mellitus with neurological manifestations, not stated as uncontrolled(250.60) 08/08/2006  . DYSLIPIDEMIA 08/08/2006  . Hypertension associated with diabetes ([place]) 08/08/2006  . GERD 08/08/2006      Laureen Abrahams, PT, DPT 06/27/19 1:07 PM      [facility] [address] [place] [place], [place],  [address] Phone: [phone]   Fax:  [phone]  Name: [name] Rice MRN: [id number] Date of Birth: 31-Jul-1956

## 2019-06-27 NOTE — Addendum Note (Signed)
Addended by: Donella Stade on: 06/27/2019 10:33 AM   Modules accepted: Orders

## 2019-07-08 ENCOUNTER — Encounter: Payer: Self-pay | Admitting: Internal Medicine

## 2019-07-08 ENCOUNTER — Ambulatory Visit (INDEPENDENT_AMBULATORY_CARE_PROVIDER_SITE_OTHER): Payer: Medicare HMO | Admitting: Physician Assistant

## 2019-07-08 ENCOUNTER — Other Ambulatory Visit: Payer: Self-pay

## 2019-07-08 ENCOUNTER — Encounter: Payer: Self-pay | Admitting: Family Medicine

## 2019-07-08 ENCOUNTER — Ambulatory Visit (INDEPENDENT_AMBULATORY_CARE_PROVIDER_SITE_OTHER): Payer: Medicare HMO | Admitting: Family Medicine

## 2019-07-08 VITALS — BP 143/83 | HR 92 | Temp 98.8°F | Wt 264.0 lb

## 2019-07-08 VITALS — BP 143/83 | HR 92 | Temp 98.8°F | Ht 63.0 in | Wt 264.0 lb

## 2019-07-08 DIAGNOSIS — R7982 Elevated C-reactive protein (CRP): Secondary | ICD-10-CM | POA: Diagnosis not present

## 2019-07-08 DIAGNOSIS — G894 Chronic pain syndrome: Secondary | ICD-10-CM | POA: Diagnosis not present

## 2019-07-08 DIAGNOSIS — M25571 Pain in right ankle and joints of right foot: Secondary | ICD-10-CM

## 2019-07-08 DIAGNOSIS — R7989 Other specified abnormal findings of blood chemistry: Secondary | ICD-10-CM | POA: Diagnosis not present

## 2019-07-08 DIAGNOSIS — Z0289 Encounter for other administrative examinations: Secondary | ICD-10-CM

## 2019-07-08 DIAGNOSIS — Z79899 Other long term (current) drug therapy: Secondary | ICD-10-CM | POA: Diagnosis not present

## 2019-07-08 MED ORDER — HYDROCODONE-ACETAMINOPHEN 10-325 MG PO TABS: 1.0000 | ORAL_TABLET | Freq: Three times a day (TID) | ORAL | 0 refills | Status: DC | PRN

## 2019-07-08 MED ORDER — FLUTICASONE PROPIONATE 50 MCG/ACT NA SUSP: 2.0000 | Freq: Every day | NASAL | 5 refills | Status: DC

## 2019-07-08 MED ORDER — HYDROCODONE-ACETAMINOPHEN 10-325 MG PO TABS: ORAL_TABLET | ORAL | 0 refills | Status: DC

## 2019-07-08 NOTE — Progress Notes (Addendum)
Subjective:    Patient ID: [name] Rice, female    DOB: [birth year], 63 y.o.   MRN: [id number]  HPI  Patient is a 63 y/o female T2Dm, diabetic neuropathy, HLD, MDD who presents for a follow up and pain management. She was recently put on insulin tresiba; states that her sugars have been less than 150. Denies any hypoglycemic associated symptoms. Is working on improving her diet and will start exercising at home with [employer].   Patient states that at her last visit, she was prescribed 15 tablets of norco rather than the 30 tablets. She takes 1/2 of one tablet per day. Her pain currently is more her right ankle but she has whole body musculoskeletal pain due to OA.   Review of Systems  Constitutional: Negative for chills, fatigue and fever.  Respiratory: Negative for shortness of breath.   Cardiovascular: Negative for chest pain and palpitations.  Gastrointestinal:       Denies any changes in bowel habits.   Genitourinary:       Denies urinary changes.   Musculoskeletal: Positive for back pain and neck pain.  Neurological: Positive for headaches (migraines).       Objective:   Physical Exam Constitutional:      Appearance: Normal appearance.  HENT:     Head: Normocephalic and atraumatic.  Eyes:     Extraocular Movements: Extraocular movements intact.     Pupils: Pupils are equal, round, and reactive to light.  Cardiovascular:     Rate and Rhythm: Normal rate and regular rhythm.  Pulmonary:     Effort: Pulmonary effort is normal.     Breath sounds: Normal breath sounds.  Musculoskeletal:        General: Swelling (R foot - hx of lis franc fx) present.  Neurological:     Mental Status: She is alert and oriented to person, place, and time.  Psychiatric:        Mood and Affect: Mood normal.        Behavior: Behavior normal.    .. Today's Vitals   07/08/19 1539  BP: (!) 143/83  Pulse: 92  Temp: 98.8 F (37.1 C)  TempSrc: Oral  Weight: 264 lb (119.7 kg)  Height:  5\' 3"  (1.6 m)   Body mass index is 46.77 kg/m.  Manual BP of RUE: 136/78  .Marland Kitchen Results for orders placed or performed in visit on 123456  BASIC METABOLIC PANEL WITH GFR  Result Value Ref Range   Glucose, Bld 195 (H) 65 - 99 mg/dL   BUN 20 7 - 25 mg/dL   Creat 1.11 (H) 0.50 - 0.99 mg/dL   GFR, Est Non African American 52 (L) > OR = 60 mL/min/1.76m2   GFR, Est African American 60 > OR = 60 mL/min/1.51m2   BUN/Creatinine Ratio 18 6 - 22 (calc)   Sodium 140 135 - 146 mmol/L   Potassium 4.2 3.5 - 5.3 mmol/L   Chloride 102 98 - 110 mmol/L   CO2 31 20 - 32 mmol/L   Calcium 8.8 8.6 - 10.4 mg/dL       Assessment & Plan:   Marland KitchenMarland KitchenTreca was seen today for follow-up.  Diagnoses and all orders for this visit:  Chronic pain syndrome -     HYDROcodone-acetaminophen (NORCO) 10-325 MG tablet; Take one/half tablet twice a day. -     Pain Mgmt, Profile 6 Conf w/o mM, U  Elevated C-reactive protein  Elevated serum creatinine -     BASIC METABOLIC  PANEL WITH GFR  Pain management contract agreement -     Pain Mgmt, Profile 6 Conf w/o mM, U  Other orders -     Discontinue: HYDROcodone-acetaminophen (NORCO) 10-325 MG tablet; Take 1 tablet by mouth every 8 (eight) hours as needed. -     fluticasone (FLONASE) 50 MCG/ACT nasal spray; Place 2 sprays into both nostrils daily.   BUN and Cr were elevated at last visit (06/18/19), will recheck today.   Norco prescription changed to 30 tablets, advised to take 1/2 tablet 1-2x/day. Pt signed pain management contract. UDS ordered today.  Marland Kitchen.PDMP reviewed during this encounter.   Follow up in 3 months.   Continue w/ current diabetic medication regimen, diet, and exercises.   Marland KitchenVernetta Honey PA-C, have reviewed and agree with the above documentation in it's entirety.

## 2019-07-08 NOTE — Progress Notes (Signed)
[name] Rice is a 63 y.o. female who presents to [facility] today for right ankle pain.  Patient has a history of radical pain thought to be due to DJD.  She has had repeated injections in the past with some benefit.  She notes her pain is been worsening and she like to proceed with repeat injections today if possible.  She has pain and swelling worsening over the last 2 weeks.  She denies any new injury.    ROS:  As above  Exam:  BP (!) 143/83   Pulse 92   Temp 98.8 F (37.1 C) (Oral)   Wt 264 lb (119.7 kg)   LMP 10/18/1995   BMI 46.77 kg/m  Wt Readings from Last 5 Encounters:  07/08/19 264 lb (119.7 kg)  06/18/19 266 lb (120.7 kg)  04/18/19 270 lb (122.5 kg)  01/31/19 274 lb (124.3 kg)  01/24/19 274 lb (124.3 kg)   General: Well Developed, well nourished, and in no acute distress.  Neuro/Psych: Alert and oriented x3, extra-ocular muscles intact, able to move all 4 extremities, sensation grossly intact. Skin: Warm and dry, no rashes noted.  Respiratory: Not using accessory muscles, speaking in full sentences, trachea midline.  Cardiovascular: Pulses palpable, no extremity edema. Abdomen: Does not appear distended. MSK: Right ankle: Moderate effusion no erythema.  Tender palpation right anterior lateral aspect of ankle.  Normal motion.    Lab and Radiology Results No results found for this or any previous visit (from the past 72 hour(s)). No results found.   Procedure: Real-time Ultrasound Guided Injection of right ankle Device: GE Logiq E   Images permanently stored and available for review in the ultrasound unit. Verbal informed consent obtained.  Discussed risks and benefits of procedure. Warned about infection bleeding damage to structures skin hypopigmentation and fat atrophy among others. Patient expresses understanding and agreement Time-out conducted.   Noted no overlying erythema, induration, or other signs of  local infection.   Skin prepped in a sterile fashion.   Local anesthesia: Topical Ethyl chloride.   With sterile technique and under real time ultrasound guidance:  40 mg of Kenalog and 1.5 mL of lidocaine injected easily.   Completed without difficulty   Pain immediately resolved suggesting accurate placement of the medication.   Advised to call if fevers/chills, erythema, induration, drainage, or persistent bleeding.   Images permanently stored and available for review in the ultrasound unit.  Impression: Technically successful ultrasound guided injection.       Assessment and Plan: 63 y.o. female with right ankle pain.  Likely degenerative changes.  Improvement following injection today.  Plan to proceed with watchful waiting and conservative management.  Recheck or return to clinic if needed.  Precautions reviewed.   PDMP not reviewed this encounter. No orders of the defined types were placed in this encounter.  No orders of the defined types were placed in this encounter.   Historical information moved to improve visibility of documentation.  Past Medical History:  Diagnosis Date  . Anxiety   . Arthritis   . Asthma    no PFTs  . Cervical dystonia   . COPD (chronic obstructive pulmonary disease) (HCC)    no PFTs  . Depression   . Diabetes mellitus   . Diarrhea   . Diverticulitis   . Diverticulosis   . Dizziness   . Gastroparesis   . GERD (gastroesophageal reflux disease)   . History of colonic polyps  hyperplastic  . History of kidney stones   . History of seborrheic dermatitis    face,ears  . HLD (hyperlipidemia)   . Hypertension   . Intussusception ([place])   . Lisfranc's sprain, left, initial encounter 10/06/2017  . Lumbar pain 2011   per x-ray - Multilevel spondylosis  . Morbid obesity ([place])   . Nephrolithiasis   . OSA on CPAP 2008  . Pneumonia   . Recurrent boils    "SLUSTER BOILS" ON ABD AND LABIA  . Sleep apnea    USES C-PAP  . Torn rotator cuff  RT SHOULDER  . Tremor CCENTRAL NERVOUS SYSTEM   Past Surgical History:  Procedure Laterality Date  . CARPAL TUNNEL RELEASE Left 2006  . ELBOW SURGERY Left   . HAND SURGERY Right RIGHT  . KNEE ARTHROSCOPY Left   . LUMBAR LAMINECTOMY    . MASS EXCISION  06/28/2012   Procedure: EXCISION MASS;  Surgeon: Harl Bowie, MD;  Location: [facility];  Service: General;  Laterality: Left;  excision left upper quadrant abdominal wall mass, Benign Lipoma  . NASAL SINUS SURGERY    . PILONIDAL CYST EXCISION  [id number]  . SUBACROMIAL DECOMPRESSION Right   . TUBAL LIGATION     Social History   Tobacco Use  . Smoking status: Former Smoker    Types: Cigarettes    Quit date: 10/17/1996    Years since quitting: 22.7  . Smokeless tobacco: Never Used  Substance Use Topics  . Alcohol use: No   family history includes Depression in her brother, brother, and sister; Diabetes in her brother, brother, mother, and sister; Heart attack in her brother and brother; Heart disease in her brother; Hyperlipidemia in her brother, brother, mother, and sister; Hypertension in her brother, brother, mother, and sister; Kidney cancer in her brother; Kidney disease in her mother; Stroke in her sister.  Medications: Current Outpatient Medications  Medication Sig Dispense Refill  . AMBULATORY NON FORMULARY MEDICATION Test strips and needles for one touch Verio IQ glucometer.  Use to test blood sugar up to four times a day. Include all supplies needed including cartridge for lancets.  Diagnosis Type 2 Diabetes 100 each 3  . AMBULATORY NON FORMULARY MEDICATION One pair of diabetic shoes. Dx. Diabetic neuropathy. 2 each 0  . buPROPion (WELLBUTRIN XL) 150 MG 24 hr tablet TAKE 1 TABLET(150 MG) BY MOUTH DAILY 90 tablet 2  . DULoxetine (CYMBALTA) 60 MG capsule Take 2 capsules (120 mg total) by mouth daily. 180 capsule 1  . famotidine (PEPCID) 20 MG tablet Take 1 tablet (20 mg total) by mouth 2 (two) times daily. 180 tablet 1  .  fluticasone (FLONASE) 50 MCG/ACT nasal spray Place 2 sprays into both nostrils daily. 16 g 2  . furosemide (LASIX) 20 MG tablet Take 1 tablet (20 mg total) by mouth 2 (two) times daily. 180 tablet 1  . gabapentin (NEURONTIN) 800 MG tablet Take 1 tablet (800 mg total) by mouth 2 (two) times daily. 180 tablet 3  . glucose blood (ONETOUCH VERIO) test strip USE AS DIRECTED FOUR TIMES DAILY 300 strip 1  . HYDROcodone-acetaminophen (NORCO) 10-325 MG tablet Take 1 tablet by mouth every 8 (eight) hours as needed. 15 tablet 0  . hydrOXYzine (ATARAX/VISTARIL) 50 MG tablet Take 1 tablet (50 mg total) by mouth every 8 (eight) hours as needed. For itching. (Patient taking differently: Take 25 mg by mouth every 8 (eight) hours as needed. For itching.) 90 tablet 2  . Insulin Degludec (TRESIBA  FLEXTOUCH) 200 UNIT/ML SOPN Inject 40 Units into the skin at bedtime. 6 pen 5  . Insulin Pen Needle (NOVOFINE) 32G X 6 MM MISC USE 3 TIMES A DAY WITH NOVOLOG 100 each 2  . levocetirizine (XYZAL) 5 MG tablet Take 1 tablet (5 mg total) by mouth 2 (two) times daily. 180 tablet 3  . liraglutide (VICTOZA) 18 MG/3ML SOPN ADMINISTER 1.8 MG UNDER THE SKIN DAILY 9 mL 1  . meclizine (ANTIVERT) 25 MG tablet Take 1 tablet (25 mg total) by mouth 3 (three) times daily as needed for dizziness. 90 tablet 0  . nystatin (MYCOSTATIN/NYSTOP) powder Apply topically 4 (four) times daily. 60 g 2  . nystatin ointment (MYCOSTATIN) apply to affected area twice a day 60 g 1  . pioglitazone (ACTOS) 30 MG tablet TAKE 1 TABLET(30 MG) BY MOUTH DAILY 90 tablet 1  . pravastatin (PRAVACHOL) 10 MG tablet Take 1 tablet (10 mg total) by mouth daily. 90 tablet 3  . propranolol (INDERAL) 20 MG tablet TAKE 1/2 TABLET BY MOUTH TWICE DAILY. 90 tablet 1   No current facility-administered medications for this visit.    Allergies  Allergen Reactions  . Ace Inhibitors Other (See Comments)    Causes blood pressure to drop rapidly  . Atrovent [Ipratropium] Swelling     Throat swelling on Atorvent Nasal Spray  . Cefaclor Hives and Itching  . Glyxambi [Empagliflozin-Linagliptin]     Itching/rash  . Shrimp [Shellfish Allergy] Nausea And Vomiting  . Farxiga [Dapagliflozin]     Itching/rash  . Ceclor [Cefaclor]   . Sulfa Antibiotics   . Sulfonamide Derivatives Rash      Discussed warning signs or symptoms. Please see discharge instructions. Patient expresses understanding.

## 2019-07-08 NOTE — Progress Notes (Deleted)
9/10 15 tablets.  7/20 5/21 12/6

## 2019-07-08 NOTE — Patient Instructions (Signed)
Thank you for coming in today.  Call or go to the ER if you develop a large red swollen joint with extreme pain or oozing puss.  Keep me updated.  Return as needed.

## 2019-07-09 ENCOUNTER — Encounter: Payer: Self-pay | Admitting: Physician Assistant

## 2019-07-09 DIAGNOSIS — R69 Illness, unspecified: Secondary | ICD-10-CM | POA: Diagnosis not present

## 2019-07-09 LAB — BASIC METABOLIC PANEL WITH GFR
BUN/Creatinine Ratio: 18 (calc) (ref 6–22)
BUN: 20 mg/dL (ref 7–25)
CO2: 31 mmol/L (ref 20–32)
Calcium: 8.8 mg/dL (ref 8.6–10.4)
Chloride: 102 mmol/L (ref 98–110)
Creat: 1.11 mg/dL — ABNORMAL HIGH (ref 0.50–0.99)
GFR, Est African American: 60 mL/min/{1.73_m2} (ref >=60)
GFR, Est Non African American: 52 mL/min/{1.73_m2} — ABNORMAL LOW (ref >=60)
Glucose, Bld: 195 mg/dL — ABNORMAL HIGH (ref 65–99)
Potassium: 4.2 mmol/L (ref 3.5–5.3)
Sodium: 140 mmol/L (ref 135–146)

## 2019-07-09 NOTE — Progress Notes (Signed)
Sugars improving. Creatine improving as well GFR went up(which is good) to 52.

## 2019-07-10 ENCOUNTER — Encounter: Payer: Self-pay | Admitting: Physician Assistant

## 2019-07-10 LAB — PAIN MGMT, PROFILE 6 CONF W/O MM, U
6 Acetylmorphine: NEGATIVE ng/mL
Alcohol Metabolites: NEGATIVE ng/mL (ref <500)
Amphetamines: NEGATIVE ng/mL
Barbiturates: NEGATIVE ng/mL
Benzodiazepines: NEGATIVE ng/mL
Cocaine Metabolite: NEGATIVE ng/mL
Codeine: NEGATIVE ng/mL
Creatinine: 221 mg/dL
Hydrocodone: 1914 ng/mL
Hydromorphone: 55 ng/mL
Marijuana Metabolite: NEGATIVE ng/mL
Methadone Metabolite: NEGATIVE ng/mL
Morphine: NEGATIVE ng/mL
Norhydrocodone: 1915 ng/mL
Opiates: POSITIVE ng/mL
Oxidant: NEGATIVE ug/mL
Oxycodone: NEGATIVE ng/mL
Phencyclidine: NEGATIVE ng/mL
pH: 5.1 (ref 4.5–9.0)

## 2019-07-10 NOTE — Progress Notes (Signed)
No concerns with drug screen.

## 2019-07-12 ENCOUNTER — Emergency Department (INDEPENDENT_AMBULATORY_CARE_PROVIDER_SITE_OTHER)
Admission: EM | Admit: 2019-07-12 | Discharge: 2019-07-12 | Disposition: A | Discharge status: Home / Self Care | Payer: Medicare HMO | Source: Home / Self Care | Attending: Emergency Medicine | Admitting: Emergency Medicine

## 2019-07-12 ENCOUNTER — Encounter: Payer: Self-pay | Admitting: Emergency Medicine

## 2019-07-12 ENCOUNTER — Other Ambulatory Visit: Payer: Self-pay | Admitting: Physician Assistant

## 2019-07-12 ENCOUNTER — Encounter: Payer: Self-pay | Admitting: Physician Assistant

## 2019-07-12 ENCOUNTER — Other Ambulatory Visit: Payer: Self-pay

## 2019-07-12 DIAGNOSIS — B029 Zoster without complications: Secondary | ICD-10-CM

## 2019-07-12 DIAGNOSIS — Z008 Encounter for other general examination: Secondary | ICD-10-CM | POA: Diagnosis not present

## 2019-07-12 MED ORDER — VALACYCLOVIR HCL 1 G PO TABS: ORAL_TABLET | ORAL | 0 refills | Status: DC

## 2019-07-12 NOTE — ED Triage Notes (Signed)
Pt states she noticed a red rash on her abdominal area this am. Tender and pus filled spots.

## 2019-07-12 NOTE — ED Provider Notes (Signed)
[name] Rice CARE    CSN: [id number] Arrival date & time: 07/12/19  1313      History   Chief Complaint Chief Complaint  Patient presents with  . Rash    HPI [name] Rice is a 63 y.o. female.  Patient enters with pain and discomfort right side of her abdomen.  She woke up this morning and noticed a rash in this area associated with 4 out of 10 pain.  She is currently on gabapentin and hydrocodone for chronic pain syndrome.  She has had shingles vaccine.  She has not been ill.  She does have diabetes with a creatinine clearance of 52 which has been lower in the past.  She did have a cat scratch to that area 2 months ago. HPI  Past Medical History:  Diagnosis Date  . Anxiety   . Arthritis   . Asthma    no PFTs  . Cervical dystonia   . COPD (chronic obstructive pulmonary disease) (HCC)    no PFTs  . Depression   . Diabetes mellitus   . Diarrhea   . Diverticulitis   . Diverticulosis   . Dizziness   . Gastroparesis   . GERD (gastroesophageal reflux disease)   . History of colonic polyps    hyperplastic  . History of kidney stones   . History of seborrheic dermatitis    face,ears  . HLD (hyperlipidemia)   . Hypertension   . Intussusception ([place])   . Lisfranc's sprain, left, initial encounter 10/06/2017  . Lumbar pain 2011   per x-ray - Multilevel spondylosis  . Morbid obesity ([place])   . Nephrolithiasis   . OSA on CPAP 2008  . Pneumonia   . Recurrent boils    "SLUSTER BOILS" ON ABD AND LABIA  . Sleep apnea    USES C-PAP  . Torn rotator cuff RT SHOULDER  . Tremor CCENTRAL NERVOUS SYSTEM    Patient Active Problem List   Diagnosis Date Noted  . Type 2 diabetes mellitus with diabetic neuropathy, with long-term current use of insulin ([place]) 06/20/2019  . Dizziness 06/20/2019  . Bilateral carpal tunnel syndrome 01/24/2019  . Pressure injury of right foot, unstageable ([place]) 09/25/2018  . Calcification and ossification of muscle 07/13/2018  . Drug-induced  constipation 05/23/2018  . Restrictive lung disease 04/12/2018  . Mixed hyperlipidemia 04/12/2018  . Vitamin D insufficiency 04/12/2018  . Hordeolum externum of right lower eyelid 02/16/2018  . Hot flashes 02/16/2018  . Ear itching 01/17/2018  . No energy 01/17/2018  . Vasomotor rhinitis 11/28/2017  . Lisfranc's dislocation, right, initial encounter 10/06/2017  . Freiberg's disease, right 10/06/2017  . Neck pain 07/11/2017  . Dystonia 09/21/2016  . Acute reaction to stress 03/28/2016  . Post-menopausal 03/16/2016  . Intertrigo 03/15/2016  . Left knee pain 03/15/2016  . Morbid obesity due to excess calories ([place]) 02/16/2016  . Right shoulder pain 02/16/2016  . Unstable balance 02/16/2016  . Chronic pain syndrome 12/15/2015  . Non-proliferative diabetic retinopathy, right eye ([place]) 11/23/2015  . Diarrhea 01/12/2015  . Ear pain 12/09/2014  . Gastroparesis due to secondary diabetes ([place]) 09/16/2014  . Diabetic peripheral neuropathy ([place]) 05/30/2014  . Diverticulitis 11/27/2013  . Hepatic steatosis 09/26/2013  . SOB (shortness of breath) on exertion 03/31/2013  . Intestinal bacterial overgrowth 09/20/2012  . Lipoma of abdominal wall 05/01/2012  . Iron deficiency anemia 03/29/2012  . History of allergic rhinitis 09/07/2011  . Preventative health care 08/16/2011  . Insomnia 02/06/2011  . OSA on  CPAP   . BPPV (benign paroxysmal positional vertigo), unspecified laterality 07/14/2009  . Depression with anxiety 01/28/2008  . Acute bilateral low back pain 11/27/2007  . Type II or unspecified type diabetes mellitus with neurological manifestations, not stated as uncontrolled(250.60) 08/08/2006  . DYSLIPIDEMIA 08/08/2006  . Hypertension associated with diabetes ([place]) 08/08/2006  . GERD 08/08/2006    Past Surgical History:  Procedure Laterality Date  . CARPAL TUNNEL RELEASE Left 2006  . ELBOW SURGERY Left   . HAND SURGERY Right RIGHT  . KNEE ARTHROSCOPY Left   . LUMBAR LAMINECTOMY     . MASS EXCISION  06/28/2012   Procedure: EXCISION MASS;  Surgeon: Harl Bowie, MD;  Location: [facility];  Service: General;  Laterality: Left;  excision left upper quadrant abdominal wall mass, Benign Lipoma  . NASAL SINUS SURGERY    . PILONIDAL CYST EXCISION  [id number]  . SUBACROMIAL DECOMPRESSION Right   . TUBAL LIGATION      OB History   No obstetric history on file.      Home Medications    Prior to Admission medications   Medication Sig Start Date End Date Taking? Authorizing Provider  AMBULATORY NON FORMULARY MEDICATION Test strips and needles for one touch Verio IQ glucometer.  Use to test blood sugar up to four times a day. Include all supplies needed including cartridge for lancets.  Diagnosis Type 2 Diabetes 02/16/18   Breeback, Jade L, PA-C  AMBULATORY NON FORMULARY MEDICATION One pair of diabetic shoes. Dx. Diabetic neuropathy. 06/20/19   Breeback, Jade L, PA-C  buPROPion (WELLBUTRIN XL) 150 MG 24 hr tablet TAKE 1 TABLET(150 MG) BY MOUTH DAILY 06/20/19   Breeback, Jade L, PA-C  DULoxetine (CYMBALTA) 60 MG capsule Take 2 capsules (120 mg total) by mouth daily. 06/20/19   Breeback, Jade L, PA-C  famotidine (PEPCID) 20 MG tablet Take 1 tablet (20 mg total) by mouth 2 (two) times daily. 06/20/19   Breeback, Jade L, PA-C  fluticasone (FLONASE) 50 MCG/ACT nasal spray Place 2 sprays into both nostrils daily. 07/08/19   Breeback, Jade L, PA-C  furosemide (LASIX) 20 MG tablet Take 1 tablet (20 mg total) by mouth 2 (two) times daily. 06/20/19   Breeback, Jade L, PA-C  gabapentin (NEURONTIN) 800 MG tablet Take 1 tablet (800 mg total) by mouth 2 (two) times daily. 06/20/19   Breeback, Jade L, PA-C  glucose blood (ONETOUCH VERIO) test strip USE AS DIRECTED FOUR TIMES DAILY 04/30/19   Breeback, Jade L, PA-C  HYDROcodone-acetaminophen (NORCO) 10-325 MG tablet Take one/half tablet twice a day. 07/08/19   Breeback, Jade L, PA-C  hydrOXYzine (ATARAX/VISTARIL) 50 MG tablet Take 1 tablet (50 mg total) by  mouth every 8 (eight) hours as needed. For itching. Patient taking differently: Take 25 mg by mouth every 8 (eight) hours as needed. For itching. 02/23/18   Breeback, Jade L, PA-C  Insulin Degludec (TRESIBA FLEXTOUCH) 200 UNIT/ML SOPN Inject 40 Units into the skin at bedtime. 06/25/19   Breeback, Jade L, PA-C  Insulin Pen Needle (NOVOFINE) 32G X 6 MM MISC USE 3 TIMES A DAY WITH NOVOLOG 08/07/18   Breeback, Jade L, PA-C  levocetirizine (XYZAL) 5 MG tablet Take 1 tablet (5 mg total) by mouth 2 (two) times daily. 01/24/19   Gregor Hams, MD  liraglutide (VICTOZA) 18 MG/3ML SOPN ADMINISTER 1.8 MG UNDER THE SKIN DAILY 06/20/19   Breeback, Jade L, PA-C  meclizine (ANTIVERT) 25 MG tablet Take 1 tablet (25 mg total) by mouth  3 (three) times daily as needed for dizziness. 06/14/19   Breeback, Jade L, PA-C  nystatin (MYCOSTATIN/NYSTOP) powder Apply topically 4 (four) times daily. 06/18/19   Donella Stade, PA-C  nystatin ointment (MYCOSTATIN) apply to affected area twice a day 06/18/19   Breeback, Jade L, PA-C  pioglitazone (ACTOS) 30 MG tablet TAKE 1 TABLET(30 MG) BY MOUTH DAILY 06/20/19   Breeback, Jade L, PA-C  pravastatin (PRAVACHOL) 10 MG tablet Take 1 tablet (10 mg total) by mouth daily. 06/20/19   Breeback, Jade L, PA-C  propranolol (INDERAL) 20 MG tablet TAKE 1/2 TABLET BY MOUTH TWICE DAILY. 06/20/19   Donella Stade, PA-C  valACYclovir (VALTREX) 1000 MG tablet 1 tablet twice a day for 7 days 07/12/19   Darlyne Russian, MD    Family History Family History  Problem Relation Age of Onset  . Heart disease Brother   . Heart attack Brother   . Depression Brother   . Diabetes Brother   . Hypertension Brother   . Hyperlipidemia Brother   . Diabetes Mother   . Hyperlipidemia Mother   . Hypertension Mother   . Kidney disease Mother   . Depression Sister   . Diabetes Sister   . Hyperlipidemia Sister   . Hypertension Sister   . Stroke Sister   . Heart attack Brother   . Depression Brother   . Diabetes Brother    . Hyperlipidemia Brother   . Hypertension Brother   . Kidney cancer Brother   . Cancer Neg Hx     Social History Social History   Tobacco Use  . Smoking status: Former Smoker    Types: Cigarettes    Quit date: 10/17/1996    Years since quitting: 22.7  . Smokeless tobacco: Never Used  Substance Use Topics  . Alcohol use: No  . Drug use: No     Allergies   Ace inhibitors, Atrovent [ipratropium], Cefaclor, Glyxambi [empagliflozin-linagliptin], Shrimp [shellfish allergy], Farxiga [dapagliflozin], Ceclor [cefaclor], Sulfa antibiotics, and Sulfonamide derivatives   Review of Systems Review of Systems  Constitutional: Negative for chills and fever.  Skin: Positive for rash. Negative for wound.  Neurological: Negative.      Physical Exam Triage Vital Signs ED Triage Vitals  Enc Vitals Group     BP 07/12/19 1336 (!) 140/58     Pulse Rate 07/12/19 1336 77     Resp --      Temp 07/12/19 1336 98.3 F (36.8 C)     Temp Source 07/12/19 1336 Oral     SpO2 07/12/19 1336 95 %     Weight --      Height --      Head Circumference --      Peak Flow --      Pain Score 07/12/19 1337 2     Pain Loc --      Pain Edu? --      Excl. in [place]? --    No data found.  Updated Vital Signs BP (!) 140/58 (BP Location: Right Arm)   Pulse 77   Temp 98.3 F (36.8 C) (Oral)   LMP 10/18/1995   SpO2 95%   Visual Acuity Right Eye Distance:   Left Eye Distance:   Bilateral Distance:    Right Eye Near:   Left Eye Near:    Bilateral Near:     Physical Exam Skin:    Comments: There is a confluent area of blisters and pustules in the T6 distribution right side of  the abdomen.  This area is very tender to touch.  There are superior to this to separate puncture wounds      UC Treatments / Results  Labs (all labs ordered are listed, but only abnormal results are displayed) Labs Reviewed  WOUND CULTURE    EKG   Radiology No results found.  Procedures Procedures (including  critical care time)  Medications Ordered in UC Medications - No data to display  Initial Impression / Assessment and Plan / UC Course  I have reviewed the triage vital signs and the nursing notes.  Patient has a shingles eruption T6-T7 on the right.  She does have borderline renal function her last creatinine clearance being 52 she has been lower in the past so we will give her a dose adjusted Valtrex dosage of twice a day instead of 3 times a day.  Culture was done because some of the vesicles were purulent.  She has had a shingles vaccine but this is not 100%.  She will keep the area clean with soap and water.  Pertinent labs & imaging results that were available during my care of the patient were reviewed by me and considered in my medical decision making (see chart for details).      Final Clinical Impressions(s) / UC Diagnoses   Final diagnoses:  Herpes zoster without complication     Discharge Instructions     We have done a culture of the area. Keep the area clean with soap and water. Wear clothes that are white. Follow-up with Iran Planas    ED Prescriptions    Medication Sig Dispense Auth. Provider   valACYclovir (VALTREX) 1000 MG tablet 1 tablet twice a day for 7 days 14 tablet [place] Wheat, Loura Back, MD     PDMP not reviewed this encounter.   Darlyne Russian, MD 07/12/19 1420

## 2019-07-12 NOTE — Discharge Instructions (Signed)
We have done a culture of the area. Keep the area clean with soap and water. Wear clothes that are white. Follow-up with Iran Planas

## 2019-07-15 LAB — WOUND CULTURE
MICRO NUMBER:: 924256
SPECIMEN QUALITY:: ADEQUATE

## 2019-07-15 NOTE — Telephone Encounter (Signed)
Virtual will be fine if she can use her phone.

## 2019-07-16 ENCOUNTER — Telehealth: Payer: Self-pay | Admitting: Emergency Medicine

## 2019-07-16 DIAGNOSIS — L739 Follicular disorder, unspecified: Secondary | ICD-10-CM

## 2019-07-16 DIAGNOSIS — G4733 Obstructive sleep apnea (adult) (pediatric): Secondary | ICD-10-CM | POA: Diagnosis not present

## 2019-07-16 DIAGNOSIS — J449 Chronic obstructive pulmonary disease, unspecified: Secondary | ICD-10-CM | POA: Diagnosis not present

## 2019-07-16 MED ORDER — DOXYCYCLINE HYCLATE 100 MG PO CAPS: 100.0000 mg | ORAL_CAPSULE | Freq: Two times a day (BID) | ORAL | 0 refills | Status: DC

## 2019-07-16 NOTE — Telephone Encounter (Signed)
Appt made for Thursday since [name] out of the office Friday. Left message on machine for patient making her aware of appt and to call back if not good date/time.

## 2019-07-16 NOTE — Telephone Encounter (Signed)
-----   Message from Sandria Manly, RN sent at 07/15/2019  3:13 PM EDT ----- Results forwarded to provider

## 2019-07-16 NOTE — Telephone Encounter (Signed)
I called and spoke with the patient.  Wound culture returned positive for staph aureus.  She is under treatment for shingles eruption with Valtrex.  She has not had any fever.  She has been cleaning the lesions with soap and water.  The culture result was positive for staph aureus sensitive to tetracycline.  She has taken doxycycline in the past without problems.  This prescription will be called in for the patient.  She has a follow-up appointment with Iran Planas on Friday.  She will be on doxycycline 100 mg twice daily for 10 days.

## 2019-07-16 NOTE — Telephone Encounter (Signed)
Thank you for update.   [name] Rice,  Can we make sure she gets on the schedule.

## 2019-07-18 ENCOUNTER — Encounter: Payer: Self-pay | Admitting: Physician Assistant

## 2019-07-18 ENCOUNTER — Ambulatory Visit (INDEPENDENT_AMBULATORY_CARE_PROVIDER_SITE_OTHER): Payer: Medicare HMO | Admitting: Physician Assistant

## 2019-07-18 VITALS — Temp 96.5°F

## 2019-07-18 DIAGNOSIS — E1140 Type 2 diabetes mellitus with diabetic neuropathy, unspecified: Secondary | ICD-10-CM | POA: Diagnosis not present

## 2019-07-18 DIAGNOSIS — R69 Illness, unspecified: Secondary | ICD-10-CM | POA: Diagnosis not present

## 2019-07-18 DIAGNOSIS — Z794 Long term (current) use of insulin: Secondary | ICD-10-CM | POA: Diagnosis not present

## 2019-07-18 DIAGNOSIS — L089 Local infection of the skin and subcutaneous tissue, unspecified: Secondary | ICD-10-CM | POA: Diagnosis not present

## 2019-07-18 DIAGNOSIS — B029 Zoster without complications: Secondary | ICD-10-CM

## 2019-07-18 DIAGNOSIS — B958 Unspecified staphylococcus as the cause of diseases classified elsewhere: Secondary | ICD-10-CM | POA: Diagnosis not present

## 2019-07-18 MED ORDER — ONETOUCH VERIO VI STRP: ORAL_STRIP | 1 refills | Status: DC

## 2019-07-18 NOTE — Progress Notes (Deleted)
Still on antibiotics. Blisters opened in shower. No new blisters. Takes 1 pain pill a day and it manages pain well.  Needs RF on test strips- tests 2 times daily

## 2019-07-18 NOTE — Progress Notes (Signed)
Patient ID: [name] Rice, female   DOB: 1956-07-08, 63 y.o.   MRN: [id number] .Marland KitchenVirtual Visit via Video Note  I connected with Rachael Fee on 07/18/19 at  1:00 PM EDT by a video enabled telemedicine application and verified that I am speaking with the correct person using two identifiers.  Location: Patient: home Provider: clinic   I discussed the limitations of evaluation and management by telemedicine and the availability of in person appointments. The patient expressed understanding and agreed to proceed.  History of Present Illness: Pt is a 63 yo female with new dx of shingles and staph skin infection. She was seen in UC on 07/12/19 and treated with valtrex and doxycycline. She remains on both medications. She is doing much better. Pain is controlled with 1 norco a day and gabapentin that is used for chronic pain. She did have blisters this morning but popped in the shower. No drainage since. Pt denies any fever or body aches. She is a little chilly but it is cold in the house. She feels good.  .. Active Ambulatory Problems    Diagnosis Date Noted  . Type II or unspecified type diabetes mellitus with neurological manifestations, not stated as uncontrolled(250.60) 08/08/2006  . DYSLIPIDEMIA 08/08/2006  . Depression with anxiety 01/28/2008  . Hypertension associated with diabetes ([place]) 08/08/2006  . GERD 08/08/2006  . Acute bilateral low back pain 11/27/2007  . BPPV (benign paroxysmal positional vertigo), unspecified laterality 07/14/2009  . OSA on CPAP   . Insomnia 02/06/2011  . Preventative health care 08/16/2011  . History of allergic rhinitis 09/07/2011  . Iron deficiency anemia 03/29/2012  . Lipoma of abdominal wall 05/01/2012  . Intestinal bacterial overgrowth 09/20/2012  . SOB (shortness of breath) on exertion 03/31/2013  . Hepatic steatosis 09/26/2013  . Diverticulitis 11/27/2013  . Diabetic peripheral neuropathy ([place]) 05/30/2014  . Gastroparesis due to secondary  diabetes ([place]) 09/16/2014  . Ear pain 12/09/2014  . Diarrhea 01/12/2015  . Non-proliferative diabetic retinopathy, right eye ([place]) 11/23/2015  . Chronic pain syndrome 12/15/2015  . Morbid obesity due to excess calories ([place]) 02/16/2016  . Right shoulder pain 02/16/2016  . Unstable balance 02/16/2016  . Intertrigo 03/15/2016  . Left knee pain 03/15/2016  . Post-menopausal 03/16/2016  . Acute reaction to stress 03/28/2016  . Dystonia 09/21/2016  . Neck pain 07/11/2017  . Lisfranc's dislocation, right, initial encounter 10/06/2017  . Freiberg's disease, right 10/06/2017  . Vasomotor rhinitis 11/28/2017  . Ear itching 01/17/2018  . No energy 01/17/2018  . Hordeolum externum of right lower eyelid 02/16/2018  . Hot flashes 02/16/2018  . Restrictive lung disease 04/12/2018  . Mixed hyperlipidemia 04/12/2018  . Vitamin D insufficiency 04/12/2018  . Drug-induced constipation 05/23/2018  . Calcification and ossification of muscle 07/13/2018  . Pressure injury of right foot, unstageable ([place]) 09/25/2018  . Bilateral carpal tunnel syndrome 01/24/2019  . Type 2 diabetes mellitus with diabetic neuropathy, with long-term current use of insulin ([place]) 06/20/2019  . Dizziness 06/20/2019   Resolved Ambulatory Problems    Diagnosis Date Noted  . IRON DEFICIENCY 01/30/2007  . DEPRESSION 08/08/2006  . TORTICOLLIS, SPASMODIC 02/13/2007  . SINUSITIS, ACUTE 05/27/2009  . ALLERGIC RHINITIS 12/25/2006  . ASTHMA 01/28/2008  . COPD 08/08/2006  . DIVERTICULOSIS, COLON 02/12/2003  . DERMATITIS, SEBORRHEIC 01/30/2007  . GOUT, HX OF 08/08/2006  . PERSONAL HX COLONIC POLYPS 09/16/2008  . NEPHROLITHIASIS, HX OF 01/28/2008  . Calf pain 11/29/2010  . Tremor, essential 02/06/2011  . Seborrheic dermatitis, unspecified  04/06/2011  . Finger pain 04/06/2011  . Shoulder pain, right 08/16/2011  . Left leg pain 04/10/2012  . Right ear pain 05/01/2012  . Seborrheic dermatitis 05/01/2012  . Hypoglycemia  05/01/2012  . Vaginal candidiasis 08/22/2012  . Sigmoid diverticulitis 09/15/2012  . Acute-on-chronic kidney disease Stage II 09/15/2012  . Cyst, breast 03/01/2013  . Lactic acidosis 03/31/2013  . Acute renal insufficiency 03/31/2013  . Abdominal pain, left lower quadrant 04/29/2013  . Gout 05/01/2013  . COPD (chronic obstructive pulmonary disease) with chronic bronchitis ([place]) 12/09/2014  . Type 2 diabetes, controlled, with peripheral neuropathy ([place]) 12/09/2014  . Fall 03/16/2016  . Foot fracture, right, closed, initial encounter 08/23/2017   Past Medical History:  Diagnosis Date  . Anxiety   . Arthritis   . Asthma   . Cervical dystonia   . COPD (chronic obstructive pulmonary disease) ([place])   . Depression   . Diabetes mellitus   . Diverticulosis   . Gastroparesis   . GERD (gastroesophageal reflux disease)   . History of colonic polyps   . History of kidney stones   . History of seborrheic dermatitis   . HLD (hyperlipidemia)   . Hypertension   . Intussusception ([place])   . Lisfranc's sprain, left, initial encounter 10/06/2017  . Lumbar pain 2011  . Morbid obesity ([place])   . Nephrolithiasis   . Pneumonia   . Recurrent boils   . Sleep apnea   . Torn rotator cuff RT SHOULDER  . Tremor [place]   Reviewed med, allergy, problem list.     Observations/Objective: No acute distress.  Normal mood and appearance.  Normal breathing.  Right T6/T7 distribution dried scabbed over sores with no significant erythema, no drainage.    Marland Kitchen.There were no vitals filed for this visit. There is no height or weight on file to calculate BMI.    Assessment and Plan: Marland KitchenMarland KitchenRenika was seen today for herpes zoster.  Diagnoses and all orders for this visit:  Herpes zoster without complication  Type 2 diabetes mellitus with diabetic neuropathy, with long-term current use of insulin (HCC) -     glucose blood (ONETOUCH VERIO) test strip; Use to test blood sugars two times daily.  DX E11.9  Staphylococcal infection of skin   Pt seems to be doing well. No fever. Pain is well controlled with norco and gabapentin. Finish valtrex and doxycycline. Infection seems to be improving. Pt has no major complaints. Discussed cool compresses over rash as needed. Follow up as needed. Pt has had zostavax could consider shingrix in the future.    Follow Up Instructions:    I discussed the assessment and treatment plan with the patient. The patient was provided an opportunity to ask questions and all were answered. The patient agreed with the plan and demonstrated an understanding of the instructions.   The patient was advised to call back or seek an in-person evaluation if the symptoms worsen or if the condition fails to improve as anticipated.   [place] Planas, PA-C

## 2019-08-15 DIAGNOSIS — J449 Chronic obstructive pulmonary disease, unspecified: Secondary | ICD-10-CM | POA: Diagnosis not present

## 2019-08-15 DIAGNOSIS — G4733 Obstructive sleep apnea (adult) (pediatric): Secondary | ICD-10-CM | POA: Diagnosis not present

## 2019-09-09 ENCOUNTER — Other Ambulatory Visit: Payer: Self-pay | Admitting: Physician Assistant

## 2019-09-09 DIAGNOSIS — E1142 Type 2 diabetes mellitus with diabetic polyneuropathy: Secondary | ICD-10-CM

## 2019-09-16 ENCOUNTER — Other Ambulatory Visit: Payer: Self-pay | Admitting: Ophthalmology

## 2019-09-16 ENCOUNTER — Other Ambulatory Visit: Payer: Self-pay | Admitting: Physician Assistant

## 2019-09-16 DIAGNOSIS — Z1231 Encounter for screening mammogram for malignant neoplasm of breast: Secondary | ICD-10-CM

## 2019-09-17 ENCOUNTER — Ambulatory Visit: Payer: Medicare HMO | Admitting: Physician Assistant

## 2019-09-23 ENCOUNTER — Ambulatory Visit (INDEPENDENT_AMBULATORY_CARE_PROVIDER_SITE_OTHER): Payer: Medicare HMO | Admitting: Physician Assistant

## 2019-09-23 ENCOUNTER — Encounter: Payer: Self-pay | Admitting: Physician Assistant

## 2019-09-23 ENCOUNTER — Other Ambulatory Visit: Payer: Self-pay

## 2019-09-23 VITALS — BP 130/70 | HR 70 | Ht 63.0 in | Wt 275.0 lb

## 2019-09-23 DIAGNOSIS — E118 Type 2 diabetes mellitus with unspecified complications: Secondary | ICD-10-CM | POA: Diagnosis not present

## 2019-09-23 DIAGNOSIS — I10 Essential (primary) hypertension: Secondary | ICD-10-CM | POA: Diagnosis not present

## 2019-09-23 DIAGNOSIS — R350 Frequency of micturition: Secondary | ICD-10-CM

## 2019-09-23 DIAGNOSIS — R7989 Other specified abnormal findings of blood chemistry: Secondary | ICD-10-CM | POA: Diagnosis not present

## 2019-09-23 DIAGNOSIS — K219 Gastro-esophageal reflux disease without esophagitis: Secondary | ICD-10-CM

## 2019-09-23 DIAGNOSIS — E1159 Type 2 diabetes mellitus with other circulatory complications: Secondary | ICD-10-CM

## 2019-09-23 DIAGNOSIS — Z794 Long term (current) use of insulin: Secondary | ICD-10-CM

## 2019-09-23 DIAGNOSIS — S93324A Dislocation of tarsometatarsal joint of right foot, initial encounter: Secondary | ICD-10-CM

## 2019-09-23 DIAGNOSIS — N3281 Overactive bladder: Secondary | ICD-10-CM

## 2019-09-23 DIAGNOSIS — L89890 Pressure ulcer of other site, unstageable: Secondary | ICD-10-CM

## 2019-09-23 DIAGNOSIS — E1142 Type 2 diabetes mellitus with diabetic polyneuropathy: Secondary | ICD-10-CM | POA: Diagnosis not present

## 2019-09-23 DIAGNOSIS — E1140 Type 2 diabetes mellitus with diabetic neuropathy, unspecified: Secondary | ICD-10-CM

## 2019-09-23 DIAGNOSIS — M25571 Pain in right ankle and joints of right foot: Secondary | ICD-10-CM

## 2019-09-23 DIAGNOSIS — I152 Hypertension secondary to endocrine disorders: Secondary | ICD-10-CM

## 2019-09-23 LAB — POCT URINALYSIS DIP (CLINITEK)
Bilirubin, UA: NEGATIVE
Blood, UA: NEGATIVE
Glucose, UA: NEGATIVE mg/dL
Ketones, POC UA: NEGATIVE mg/dL
Nitrite, UA: NEGATIVE
POC PROTEIN,UA: NEGATIVE
Spec Grav, UA: 1.02 (ref 1.010–1.025)
Urobilinogen, UA: 0.2 E.U./dL
pH, UA: 5 (ref 5.0–8.0)

## 2019-09-23 LAB — POCT GLYCOSYLATED HEMOGLOBIN (HGB A1C): Hemoglobin A1C: 7.4 % — AB (ref 4.0–5.6)

## 2019-09-23 MED ORDER — VICTOZA 18 MG/3ML ~~LOC~~ SOPN: PEN_INJECTOR | SUBCUTANEOUS | 1 refills | Status: DC

## 2019-09-23 MED ORDER — AMBULATORY NON FORMULARY MEDICATION: 0 refills | Status: DC

## 2019-09-23 MED ORDER — PIOGLITAZONE HCL 30 MG PO TABS: ORAL_TABLET | ORAL | 1 refills | Status: DC

## 2019-09-23 MED ORDER — FAMOTIDINE 20 MG PO TABS: 20.0000 mg | ORAL_TABLET | Freq: Two times a day (BID) | ORAL | 1 refills | Status: DC

## 2019-09-23 MED ORDER — PROPRANOLOL HCL 20 MG PO TABS: ORAL_TABLET | ORAL | 1 refills | Status: DC

## 2019-09-23 MED ORDER — MIRABEGRON ER 25 MG PO TB24: 25.0000 mg | ORAL_TABLET | Freq: Every day | ORAL | 0 refills | Status: DC

## 2019-09-24 LAB — URINE CULTURE
MICRO NUMBER:: 1170805
SPECIMEN QUALITY:: ADEQUATE

## 2019-09-25 ENCOUNTER — Telehealth: Payer: Self-pay | Admitting: Neurology

## 2019-09-25 DIAGNOSIS — N3281 Overactive bladder: Secondary | ICD-10-CM | POA: Insufficient documentation

## 2019-09-25 DIAGNOSIS — M25571 Pain in right ankle and joints of right foot: Secondary | ICD-10-CM | POA: Insufficient documentation

## 2019-09-25 MED ORDER — AMBULATORY NON FORMULARY MEDICATION: 1.0000 | Freq: Every day | 0 refills | Status: DC | PRN

## 2019-09-25 MED ORDER — TRANSFER BENCH MISC: 1.0000 | 0 refills | Status: DC | PRN

## 2019-09-25 NOTE — Telephone Encounter (Signed)
Patient left vm asking for RX for transfer bench and mat for bathtub. She will pick up when she comes to get labs drawn. RX written. Patient made aware.

## 2019-09-25 NOTE — Progress Notes (Signed)
[name] Rice,   Urine culture did not show any abnormal bacteria.   [name]

## 2019-09-25 NOTE — Progress Notes (Signed)
Subjective:    Patient ID: [name] Rice, female    DOB: 14-Mar-1956, 63 y.o.   MRN: [id number]  HPI  Patient is a obese 63 year old female with hypertension, type 2 diabetes, hyperlipidemia, MDD, insomnia, anxiety who presents to the clinic for her 60-month follow-up.  Patient is checking her sugars intermittently but not regularly.  She will check her sugars before her nighttime insulin.  If it is below 130 she does not take [place].  She is taking her Victoza and Actos daily.  She denies any open sores or wounds.  She is trying to get to a more regular schedule with sleeping and staying awake.  She denies any hypoglycemic events.  When she does take [place] she takes varying units.  Patient denies any chest pains, palpitations, headaches.  Patient continues to have a lot of pain associated with her feet.  Dr. Georgina Snell was working on her with this.  He recently did an injection in her right foot.  That seemed to help for a little while.  Now she is having more more episodes of just shooting pain.  She has ongoing diabetic neuropathy.  She is having some more frequent urination.  She denies any flank or abdominal pain.  She denies any dysuria.  She denies any fever, chills, nausea or vomiting. She does have urinary incontience and lasix makes worse.   .. Active Ambulatory Problems    Diagnosis Date Noted  . Type II or unspecified type diabetes mellitus with neurological manifestations, not stated as uncontrolled(250.60) 08/08/2006  . DYSLIPIDEMIA 08/08/2006  . Depression with anxiety 01/28/2008  . Hypertension associated with diabetes ([place]) 08/08/2006  . GERD 08/08/2006  . Acute bilateral low back pain 11/27/2007  . BPPV (benign paroxysmal positional vertigo), unspecified laterality 07/14/2009  . OSA on CPAP   . Insomnia 02/06/2011  . Preventative health care 08/16/2011  . History of allergic rhinitis 09/07/2011  . Iron deficiency anemia 03/29/2012  . Lipoma of abdominal wall 05/01/2012   . Intestinal bacterial overgrowth 09/20/2012  . SOB (shortness of breath) on exertion 03/31/2013  . Hepatic steatosis 09/26/2013  . Diverticulitis 11/27/2013  . Diabetic peripheral neuropathy ([place]) 05/30/2014  . Gastroparesis due to secondary diabetes ([place]) 09/16/2014  . Ear pain 12/09/2014  . Diarrhea 01/12/2015  . Non-proliferative diabetic retinopathy, right eye ([place]) 11/23/2015  . Chronic pain syndrome 12/15/2015  . Morbid obesity due to excess calories ([place]) 02/16/2016  . Right shoulder pain 02/16/2016  . Unstable balance 02/16/2016  . Intertrigo 03/15/2016  . Left knee pain 03/15/2016  . Post-menopausal 03/16/2016  . Acute reaction to stress 03/28/2016  . Dystonia 09/21/2016  . Neck pain 07/11/2017  . Lisfranc's dislocation, right, initial encounter 10/06/2017  . Freiberg's disease, right 10/06/2017  . Vasomotor rhinitis 11/28/2017  . Ear itching 01/17/2018  . No energy 01/17/2018  . Hordeolum externum of right lower eyelid 02/16/2018  . Hot flashes 02/16/2018  . Restrictive lung disease 04/12/2018  . Mixed hyperlipidemia 04/12/2018  . Vitamin D insufficiency 04/12/2018  . Drug-induced constipation 05/23/2018  . Calcification and ossification of muscle 07/13/2018  . Pressure injury of right foot, unstageable ([place]) 09/25/2018  . Bilateral carpal tunnel syndrome 01/24/2019  . Type 2 diabetes mellitus with diabetic neuropathy, with long-term current use of insulin ([place]) 06/20/2019  . Dizziness 06/20/2019  . Acute right ankle pain 09/25/2019  . OAB (overactive bladder) 09/25/2019   Resolved Ambulatory Problems    Diagnosis Date Noted  . IRON DEFICIENCY 01/30/2007  . DEPRESSION 08/08/2006  .  TORTICOLLIS, SPASMODIC 02/13/2007  . SINUSITIS, ACUTE 05/27/2009  . ALLERGIC RHINITIS 12/25/2006  . ASTHMA 01/28/2008  . COPD 08/08/2006  . DIVERTICULOSIS, COLON 02/12/2003  . DERMATITIS, SEBORRHEIC 01/30/2007  . GOUT, HX OF 08/08/2006  . PERSONAL HX COLONIC POLYPS 09/16/2008   . NEPHROLITHIASIS, HX OF 01/28/2008  . Calf pain 11/29/2010  . Tremor, essential 02/06/2011  . Seborrheic dermatitis, unspecified 04/06/2011  . Finger pain 04/06/2011  . Shoulder pain, right 08/16/2011  . Left leg pain 04/10/2012  . Right ear pain 05/01/2012  . Seborrheic dermatitis 05/01/2012  . Hypoglycemia 05/01/2012  . Vaginal candidiasis 08/22/2012  . Sigmoid diverticulitis 09/15/2012  . Acute-on-chronic kidney disease Stage II 09/15/2012  . Cyst, breast 03/01/2013  . Lactic acidosis 03/31/2013  . Acute renal insufficiency 03/31/2013  . Abdominal pain, left lower quadrant 04/29/2013  . Gout 05/01/2013  . COPD (chronic obstructive pulmonary disease) with chronic bronchitis ([place]) 12/09/2014  . Type 2 diabetes, controlled, with peripheral neuropathy ([place]) 12/09/2014  . Fall 03/16/2016  . Foot fracture, right, closed, initial encounter 08/23/2017   Past Medical History:  Diagnosis Date  . Anxiety   . Arthritis   . Asthma   . Cervical dystonia   . COPD (chronic obstructive pulmonary disease) ([place])   . Depression   . Diabetes mellitus   . Diverticulosis   . Gastroparesis   . GERD (gastroesophageal reflux disease)   . History of colonic polyps   . History of kidney stones   . History of seborrheic dermatitis   . HLD (hyperlipidemia)   . Hypertension   . Intussusception ([place])   . Lisfranc's sprain, left, initial encounter 10/06/2017  . Lumbar pain 2011  . Morbid obesity ([place])   . Nephrolithiasis   . Pneumonia   . Recurrent boils   . Sleep apnea   . Torn rotator cuff RT SHOULDER  . Tremor CCENTRAL NERVOUS SYSTEM      Review of Systems See HPI.     Objective:   Physical Exam        Assessment & Plan:  Marland KitchenMarland KitchenHillari was seen today for diabetes.  Diagnoses and all orders for this visit:  Type 2 diabetes mellitus with diabetic neuropathy, with long-term current use of insulin (HCC) -     POCT glycosylated hemoglobin (Hb A1C)  Diabetic peripheral neuropathy  (HCC) -     pioglitazone (ACTOS) 30 MG tablet; TAKE 1 TABLET(30 MG) BY MOUTH DAILY -     liraglutide (VICTOZA) 18 MG/3ML SOPN; ADMINISTER 1.8 MG UNDER THE SKIN DAILY -     AMBULATORY NON FORMULARY MEDICATION; Lift Chair DX: [id number], [id number], [id number]  Type 2 diabetes mellitus with complication, with long-term current use of insulin (HCC) -     pioglitazone (ACTOS) 30 MG tablet; TAKE 1 TABLET(30 MG) BY MOUTH DAILY  Essential hypertension -     propranolol (INDERAL) 20 MG tablet; TAKE 1/2 TABLET BY MOUTH TWICE DAILY.  Gastroesophageal reflux disease, unspecified whether esophagitis present -     famotidine (PEPCID) 20 MG tablet; Take 1 tablet (20 mg total) by mouth 2 (two) times daily.  Elevated serum creatinine -     COMPLETE METABOLIC PANEL WITH GFR  Lisfranc's dislocation, right, initial encounter -     AMBULATORY NON FORMULARY MEDICATION; Lift Chair DX: [id number], [id number], [id number] -     Ambulatory referral to Orthopedics  Pressure injury of right foot, unstageable ([place]) -     AMBULATORY NON FORMULARY MEDICATION; Lift Chair DX: [id number], [id number]  Frequent urination -  POCT URINALYSIS DIP (CLINITEK) -     Urine Culture  Hypertension associated with diabetes ([place])  Acute right ankle pain -     Ambulatory referral to Orthopedics  OAB (overactive bladder) -     mirabegron ER (MYRBETRIQ) 25 MG TB24 tablet; Take 1 tablet (25 mg total) by mouth daily.  .. Results for orders placed or performed in visit on 09/23/19  Urine Culture   Specimen: Urine  Result Value Ref Range   MICRO NUMBER: [id number]    SPECIMEN QUALITY: Adequate    Sample Source URINE    STATUS: FINAL    ISOLATE 1:      Growth of mixed flora was isolated, suggesting probable contamination. No further testing will be performed. If clinically indicated, recollection using a method to minimize contamination, with prompt transfer to Urine Culture Transport Tube, is  recommended.   POCT glycosylated  hemoglobin (Hb A1C)  Result Value Ref Range   Hemoglobin A1C 7.4 (A) 4.0 - 5.6 %   HbA1c POC (<> result, manual entry)     HbA1c, POC (prediabetic range)     HbA1c, POC (controlled diabetic range)    POCT URINALYSIS DIP (CLINITEK)  Result Value Ref Range   Color, UA yellow yellow   Clarity, UA clear clear   Glucose, UA negative negative mg/dL   Bilirubin, UA negative negative   Ketones, POC UA negative negative mg/dL   Spec Grav, UA 1.020 1.010 - 1.025   Blood, UA negative negative   pH, UA 5.0 5.0 - 8.0   POC PROTEIN,UA negative negative, trace   Urobilinogen, UA 0.2 0.2 or 1.0 E.U./dL   Nitrite, UA Negative Negative   Leukocytes, UA Trace (A) Negative   A1c 7.4 which is coming down. I do not like the intermediate use of [place].  Explained to patient that should be a nightly more regular dosing.  It seems like her sugars are dropping at some point and she feels like she does not need that test.  I recommended patient taking her nightly dose to 10 units and starting from there.  She could then check her morning fasting sugars and still and for the target range of 90-130. Continue actos and victoza.  Continue DM diet.  On statin.  Micro up to date.  BP to goal.  Flu and pneumonia shot up to date.  Eye exam UTD.   Pt has complicated ortho history with her feet. Fairly recent hx of lisfranc fracture. Previously managed by Dr. Georgina Snell. Will make ortho referral.   Pt is having some OAB symptoms. UA today positive for leuks. Will culture. Start myrbetriq.   Follow up in 3 months.

## 2019-10-03 DIAGNOSIS — M19079 Primary osteoarthritis, unspecified ankle and foot: Secondary | ICD-10-CM | POA: Diagnosis not present

## 2019-10-03 DIAGNOSIS — R7989 Other specified abnormal findings of blood chemistry: Secondary | ICD-10-CM | POA: Diagnosis not present

## 2019-10-03 DIAGNOSIS — R52 Pain, unspecified: Secondary | ICD-10-CM | POA: Diagnosis not present

## 2019-10-03 DIAGNOSIS — Z969 Presence of functional implant, unspecified: Secondary | ICD-10-CM | POA: Diagnosis not present

## 2019-10-03 DIAGNOSIS — S86301A Unspecified injury of muscle(s) and tendon(s) of peroneal muscle group at lower leg level, right leg, initial encounter: Secondary | ICD-10-CM | POA: Diagnosis not present

## 2019-10-03 DIAGNOSIS — S93324S Dislocation of tarsometatarsal joint of right foot, sequela: Secondary | ICD-10-CM | POA: Diagnosis not present

## 2019-10-04 ENCOUNTER — Encounter: Payer: Self-pay | Admitting: Neurology

## 2019-10-04 ENCOUNTER — Encounter: Payer: Self-pay | Admitting: Physician Assistant

## 2019-10-04 DIAGNOSIS — N183 Chronic kidney disease, stage 3 unspecified: Secondary | ICD-10-CM | POA: Insufficient documentation

## 2019-10-04 LAB — COMPLETE METABOLIC PANEL WITH GFR
AG Ratio: 1.3 (calc) (ref 1.0–2.5)
ALT: 10 U/L (ref 6–29)
AST: 14 U/L (ref 10–35)
Albumin: 3.8 g/dL (ref 3.6–5.1)
Alkaline phosphatase (APISO): 85 U/L (ref 37–153)
BUN/Creatinine Ratio: 21 (calc) (ref 6–22)
BUN: 23 mg/dL (ref 7–25)
CO2: 23 mmol/L (ref 20–32)
Calcium: 8.8 mg/dL (ref 8.6–10.4)
Chloride: 104 mmol/L (ref 98–110)
Creat: 1.1 mg/dL — ABNORMAL HIGH (ref 0.50–0.99)
GFR, Est African American: 61 mL/min/{1.73_m2} (ref >=60)
GFR, Est Non African American: 53 mL/min/{1.73_m2} — ABNORMAL LOW (ref >=60)
Globulin: 2.9 g/dL (calc) (ref 1.9–3.7)
Glucose, Bld: 143 mg/dL — ABNORMAL HIGH (ref 65–99)
Potassium: 4.3 mmol/L (ref 3.5–5.3)
Sodium: 140 mmol/L (ref 135–146)
Total Bilirubin: 0.4 mg/dL (ref 0.2–1.2)
Total Protein: 6.7 g/dL (ref 6.1–8.1)

## 2019-10-04 NOTE — Progress Notes (Signed)
Kidney function is stable from last recheck. Function is still decreased a little. Avoid all NSAIDs, keep hydrated. Will send info on CKD and how to keep kidney the healthiest.

## 2019-10-08 DIAGNOSIS — G8929 Other chronic pain: Secondary | ICD-10-CM | POA: Diagnosis not present

## 2019-10-08 DIAGNOSIS — M25571 Pain in right ankle and joints of right foot: Secondary | ICD-10-CM | POA: Diagnosis not present

## 2019-10-20 ENCOUNTER — Other Ambulatory Visit: Payer: Self-pay | Admitting: Physician Assistant

## 2019-10-20 DIAGNOSIS — J301 Allergic rhinitis due to pollen: Secondary | ICD-10-CM

## 2019-10-21 ENCOUNTER — Other Ambulatory Visit: Payer: Self-pay | Admitting: Physician Assistant

## 2019-10-21 DIAGNOSIS — G894 Chronic pain syndrome: Secondary | ICD-10-CM

## 2019-10-21 MED ORDER — HYDROCODONE-ACETAMINOPHEN 10-325 MG PO TABS: ORAL_TABLET | ORAL | 0 refills | Status: DC

## 2019-10-21 NOTE — Telephone Encounter (Signed)
Patient called and left a message that she would like a refill of her Hydrocodone sent to [facility]. Please advise.

## 2019-10-22 NOTE — Progress Notes (Signed)
Received a fax that Hydrocodone was approved and pharmacy is aware.

## 2019-10-23 ENCOUNTER — Encounter: Payer: Self-pay | Admitting: Physician Assistant

## 2019-10-23 DIAGNOSIS — Z969 Presence of functional implant, unspecified: Secondary | ICD-10-CM | POA: Diagnosis not present

## 2019-10-23 DIAGNOSIS — M19079 Primary osteoarthritis, unspecified ankle and foot: Secondary | ICD-10-CM | POA: Diagnosis not present

## 2019-10-23 DIAGNOSIS — M79671 Pain in right foot: Secondary | ICD-10-CM | POA: Diagnosis not present

## 2019-10-23 DIAGNOSIS — S86301A Unspecified injury of muscle(s) and tendon(s) of peroneal muscle group at lower leg level, right leg, initial encounter: Secondary | ICD-10-CM | POA: Diagnosis not present

## 2019-10-23 DIAGNOSIS — S93324S Dislocation of tarsometatarsal joint of right foot, sequela: Secondary | ICD-10-CM | POA: Diagnosis not present

## 2019-10-24 MED ORDER — METOCLOPRAMIDE HCL 10 MG PO TABS: 5.0000 mg | ORAL_TABLET | Freq: Three times a day (TID) | ORAL | 0 refills | Status: DC

## 2019-10-25 DIAGNOSIS — J449 Chronic obstructive pulmonary disease, unspecified: Secondary | ICD-10-CM | POA: Diagnosis not present

## 2019-10-25 DIAGNOSIS — G4733 Obstructive sleep apnea (adult) (pediatric): Secondary | ICD-10-CM | POA: Diagnosis not present

## 2019-10-31 DIAGNOSIS — M19079 Primary osteoarthritis, unspecified ankle and foot: Secondary | ICD-10-CM | POA: Diagnosis not present

## 2019-10-31 DIAGNOSIS — Z969 Presence of functional implant, unspecified: Secondary | ICD-10-CM | POA: Diagnosis not present

## 2019-10-31 DIAGNOSIS — S93324S Dislocation of tarsometatarsal joint of right foot, sequela: Secondary | ICD-10-CM | POA: Diagnosis not present

## 2019-11-20 ENCOUNTER — Encounter: Payer: Self-pay | Admitting: Physician Assistant

## 2019-11-25 ENCOUNTER — Other Ambulatory Visit: Payer: Self-pay | Admitting: Physician Assistant

## 2019-11-25 DIAGNOSIS — G894 Chronic pain syndrome: Secondary | ICD-10-CM

## 2019-11-25 DIAGNOSIS — N3281 Overactive bladder: Secondary | ICD-10-CM

## 2019-11-27 ENCOUNTER — Encounter: Payer: Self-pay | Admitting: Physician Assistant

## 2019-11-27 DIAGNOSIS — J301 Allergic rhinitis due to pollen: Secondary | ICD-10-CM

## 2019-11-27 MED ORDER — HYDROCODONE-ACETAMINOPHEN 10-325 MG PO TABS: ORAL_TABLET | ORAL | 0 refills | Status: DC

## 2019-11-27 MED ORDER — MIRABEGRON ER 25 MG PO TB24: 25.0000 mg | ORAL_TABLET | Freq: Every day | ORAL | 0 refills | Status: DC

## 2019-11-27 MED ORDER — LEVOCETIRIZINE DIHYDROCHLORIDE 5 MG PO TABS: 5.0000 mg | ORAL_TABLET | Freq: Every evening | ORAL | 3 refills | Status: DC

## 2019-11-27 NOTE — Telephone Encounter (Signed)
[employer] is handling the billing. They are submitting it through to [facility]. I left a message with patient advising her of update.

## 2019-12-15 ENCOUNTER — Ambulatory Visit: Payer: Medicare HMO | Attending: Internal Medicine

## 2019-12-15 DIAGNOSIS — Z23 Encounter for immunization: Secondary | ICD-10-CM

## 2019-12-15 NOTE — Progress Notes (Signed)
   Covid-19 Vaccination Clinic  Name:  DORICE FLEISCHMANN    MRN: [id number] DOB: [birth year]  12/15/2019  Ms. Selmon was observed post Covid-19 immunization for 15 minutes without incidence. She was provided with Vaccine Information Sheet and instruction to access the V-Safe system.   Ms. Glacken was instructed to call 911 with any severe reactions post vaccine: Marland Kitchen Difficulty breathing  . Swelling of your face and throat  . A fast heartbeat  . A bad rash all over your body  . Dizziness and weakness    Immunizations Administered    Name Date Dose VIS Date Route   Pfizer COVID-19 Vaccine 12/15/2019  3:33 PM 0.3 mL 09/27/2019 Intramuscular   Manufacturer: [place]   Lot: [id number]   [place]: [id number]

## 2019-12-19 ENCOUNTER — Other Ambulatory Visit: Payer: Self-pay | Admitting: Neurology

## 2019-12-19 DIAGNOSIS — E1142 Type 2 diabetes mellitus with diabetic polyneuropathy: Secondary | ICD-10-CM

## 2019-12-19 MED ORDER — VICTOZA 18 MG/3ML ~~LOC~~ SOPN: PEN_INJECTOR | SUBCUTANEOUS | 0 refills | Status: DC

## 2019-12-23 ENCOUNTER — Ambulatory Visit (INDEPENDENT_AMBULATORY_CARE_PROVIDER_SITE_OTHER): Payer: Medicare HMO | Admitting: Physician Assistant

## 2019-12-23 ENCOUNTER — Other Ambulatory Visit: Payer: Self-pay

## 2019-12-23 VITALS — BP 169/54 | HR 80

## 2019-12-23 DIAGNOSIS — E1343 Other specified diabetes mellitus with diabetic autonomic (poly)neuropathy: Secondary | ICD-10-CM

## 2019-12-23 DIAGNOSIS — R6889 Other general symptoms and signs: Secondary | ICD-10-CM | POA: Diagnosis not present

## 2019-12-23 DIAGNOSIS — E1140 Type 2 diabetes mellitus with diabetic neuropathy, unspecified: Secondary | ICD-10-CM

## 2019-12-23 DIAGNOSIS — M65331 Trigger finger, right middle finger: Secondary | ICD-10-CM | POA: Diagnosis not present

## 2019-12-23 DIAGNOSIS — N1831 Chronic kidney disease, stage 3a: Secondary | ICD-10-CM | POA: Diagnosis not present

## 2019-12-23 DIAGNOSIS — M25512 Pain in left shoulder: Secondary | ICD-10-CM

## 2019-12-23 DIAGNOSIS — Z794 Long term (current) use of insulin: Secondary | ICD-10-CM | POA: Diagnosis not present

## 2019-12-23 DIAGNOSIS — M25511 Pain in right shoulder: Secondary | ICD-10-CM

## 2019-12-23 DIAGNOSIS — E1142 Type 2 diabetes mellitus with diabetic polyneuropathy: Secondary | ICD-10-CM

## 2019-12-23 DIAGNOSIS — G8929 Other chronic pain: Secondary | ICD-10-CM

## 2019-12-23 DIAGNOSIS — G894 Chronic pain syndrome: Secondary | ICD-10-CM

## 2019-12-23 DIAGNOSIS — E1159 Type 2 diabetes mellitus with other circulatory complications: Secondary | ICD-10-CM

## 2019-12-23 DIAGNOSIS — R0602 Shortness of breath: Secondary | ICD-10-CM

## 2019-12-23 DIAGNOSIS — I152 Hypertension secondary to endocrine disorders: Secondary | ICD-10-CM

## 2019-12-23 DIAGNOSIS — M65321 Trigger finger, right index finger: Secondary | ICD-10-CM | POA: Diagnosis not present

## 2019-12-23 DIAGNOSIS — I10 Essential (primary) hypertension: Secondary | ICD-10-CM

## 2019-12-23 LAB — POCT GLYCOSYLATED HEMOGLOBIN (HGB A1C): Hemoglobin A1C: 6.9 % — AB (ref 4.0–5.6)

## 2019-12-23 MED ORDER — HYDROCODONE-ACETAMINOPHEN 10-325 MG PO TABS: ORAL_TABLET | ORAL | 0 refills | Status: DC

## 2019-12-23 NOTE — Progress Notes (Deleted)
   Subjective:    Patient ID: [name] Rice, female    DOB: 1955/12/02, 64 y.o.   MRN: [id number]  HPI  Lisfranc's dislocation 2019- norco-   bP elevated 09/2019 BP 130/70  Review of Systems     Objective:   Physical Exam    .. Results for orders placed or performed in visit on 12/23/19  POCT glycosylated hemoglobin (Hb A1C)  Result Value Ref Range   Hemoglobin A1C 6.9 (A) 4.0 - 5.6 %   HbA1c POC (<> result, manual entry)     HbA1c, POC (prediabetic range)     HbA1c, POC (controlled diabetic range)         Assessment & Plan:

## 2019-12-24 ENCOUNTER — Encounter: Payer: Self-pay | Admitting: Physician Assistant

## 2019-12-24 DIAGNOSIS — G8929 Other chronic pain: Secondary | ICD-10-CM | POA: Insufficient documentation

## 2019-12-24 DIAGNOSIS — M25511 Pain in right shoulder: Secondary | ICD-10-CM | POA: Insufficient documentation

## 2019-12-24 DIAGNOSIS — M65331 Trigger finger, right middle finger: Secondary | ICD-10-CM | POA: Insufficient documentation

## 2019-12-24 DIAGNOSIS — M65321 Trigger finger, right index finger: Secondary | ICD-10-CM | POA: Insufficient documentation

## 2019-12-24 MED ORDER — METOCLOPRAMIDE HCL 10 MG PO TABS: 5.0000 mg | ORAL_TABLET | Freq: Three times a day (TID) | ORAL | 0 refills | Status: DC

## 2019-12-24 MED ORDER — FUROSEMIDE 20 MG PO TABS: 20.0000 mg | ORAL_TABLET | Freq: Two times a day (BID) | ORAL | 1 refills | Status: DC

## 2019-12-24 MED ORDER — DULOXETINE HCL 60 MG PO CPEP: 120.0000 mg | ORAL_CAPSULE | Freq: Every day | ORAL | 1 refills | Status: DC

## 2019-12-24 NOTE — Progress Notes (Signed)
Subjective:    Patient ID: [name] Rice, female    DOB: [birth year], 64 y.o.   MRN: [id number]  HPI Pt is a 64 yo obese female with HTN, T2DM, HLD, MDD, insomnia and anxiety who presents to the clinic for her 3 month follow up.   Pt is not checking her sugars like she was. She does not remember her last reading. She does not take tresiba if below 130 but since she has been checking she is not taking. She takes her victoza and actos most days. No open sores or wounds. She feels like she is sleeping more. She is not walking due to pain. No hypoglycemic episodes. Her gastroparesis is better with reglan.   Pt denies any chest pains, palpitations, headaches, dizziness.   She continues to have a lot of pain associated with her right foot after Lisfranc fracture. She was working with Dr. Georgina Snell on this before he left. She had surgery back in 2019 with Dr. Caffie Pinto. She is not going to Dr. Tawni Millers and she would like to do surgery again. She can bearly put any pressure on her right foot. She is nearly non-ambulatory. She continues to have ongoing neuropathy in both feet.   She is having more pain everywhere. Her bilateral shoulders are very painful with movement. She was told she has "bursitis in her shoulders".  She has this pain ongoing and for years. No known injury. She also has 2 fingers on her right hand index/middle that are getting stuck like her thumb. She had injection before and wonders if she can get again. Due to CKD she tries to avoid NSAIDs.  She does have norco that she uses as needed for moderate to severe pain.   .. Active Ambulatory Problems    Diagnosis Date Noted  . Type II or unspecified type diabetes mellitus with neurological manifestations, not stated as uncontrolled(250.60) 08/08/2006  . DYSLIPIDEMIA 08/08/2006  . Depression with anxiety 01/28/2008  . Hypertension associated with diabetes ([place]) 08/08/2006  . GERD 08/08/2006  . Acute bilateral low back pain 11/27/2007  . OSA on  CPAP   . Insomnia 02/06/2011  . Preventative health care 08/16/2011  . History of allergic rhinitis 09/07/2011  . Iron deficiency anemia 03/29/2012  . Lipoma of abdominal wall 05/01/2012  . Intestinal bacterial overgrowth 09/20/2012  . SOB (shortness of breath) on exertion 03/31/2013  . Hepatic steatosis 09/26/2013  . Diverticulitis 11/27/2013  . Diabetic peripheral neuropathy ([place]) 05/30/2014  . Gastroparesis due to secondary diabetes ([place]) 09/16/2014  . Ear pain 12/09/2014  . Diarrhea 01/12/2015  . Non-proliferative diabetic retinopathy, right eye ([place]) 11/23/2015  . Chronic pain syndrome 12/15/2015  . Morbid obesity due to excess calories ([place]) 02/16/2016  . Right shoulder pain 02/16/2016  . Unstable balance 02/16/2016  . Intertrigo 03/15/2016  . Left knee pain 03/15/2016  . Post-menopausal 03/16/2016  . Acute reaction to stress 03/28/2016  . Dystonia 09/21/2016  . Neck pain 07/11/2017  . Lisfranc's dislocation, right, initial encounter 10/06/2017  . Freiberg's disease, right 10/06/2017  . Vasomotor rhinitis 11/28/2017  . Ear itching 01/17/2018  . No energy 01/17/2018  . Hordeolum externum of right lower eyelid 02/16/2018  . Hot flashes 02/16/2018  . Restrictive lung disease 04/12/2018  . Mixed hyperlipidemia 04/12/2018  . Vitamin D insufficiency 04/12/2018  . Drug-induced constipation 05/23/2018  . Calcification and ossification of muscle 07/13/2018  . Pressure injury of right foot, unstageable ([place]) 09/25/2018  . Bilateral carpal tunnel syndrome 01/24/2019  . Type  2 diabetes mellitus with diabetic neuropathy, with long-term current use of insulin ([place]) 06/20/2019  . Dizziness 06/20/2019  . Acute right ankle pain 09/25/2019  . OAB (overactive bladder) 09/25/2019  . Chronic kidney disease (CKD), stage III (moderate) 10/04/2019  . Trigger finger, right middle finger 12/24/2019  . Trigger index finger of right hand 12/24/2019  . Chronic pain of both shoulders 12/24/2019    Resolved Ambulatory Problems    Diagnosis Date Noted  . IRON DEFICIENCY 01/30/2007  . DEPRESSION 08/08/2006  . TORTICOLLIS, SPASMODIC 02/13/2007  . SINUSITIS, ACUTE 05/27/2009  . ALLERGIC RHINITIS 12/25/2006  . ASTHMA 01/28/2008  . COPD 08/08/2006  . DIVERTICULOSIS, COLON 02/12/2003  . DERMATITIS, SEBORRHEIC 01/30/2007  . BPPV (benign paroxysmal positional vertigo), unspecified laterality 07/14/2009  . GOUT, HX OF 08/08/2006  . PERSONAL HX COLONIC POLYPS 09/16/2008  . NEPHROLITHIASIS, HX OF 01/28/2008  . Calf pain 11/29/2010  . Tremor, essential 02/06/2011  . Seborrheic dermatitis, unspecified 04/06/2011  . Finger pain 04/06/2011  . Shoulder pain, right 08/16/2011  . Left leg pain 04/10/2012  . Right ear pain 05/01/2012  . Seborrheic dermatitis 05/01/2012  . Hypoglycemia 05/01/2012  . Vaginal candidiasis 08/22/2012  . Sigmoid diverticulitis 09/15/2012  . Acute-on-chronic kidney disease Stage II 09/15/2012  . Cyst, breast 03/01/2013  . Lactic acidosis 03/31/2013  . Acute renal insufficiency 03/31/2013  . Abdominal pain, left lower quadrant 04/29/2013  . Gout 05/01/2013  . COPD (chronic obstructive pulmonary disease) with chronic bronchitis ([place]) 12/09/2014  . Type 2 diabetes, controlled, with peripheral neuropathy ([place]) 12/09/2014  . Fall 03/16/2016  . Foot fracture, right, closed, initial encounter 08/23/2017   Past Medical History:  Diagnosis Date  . Anxiety   . Arthritis   . Asthma   . Cervical dystonia   . COPD (chronic obstructive pulmonary disease) ([place])   . Depression   . Diabetes mellitus   . Diverticulosis   . Gastroparesis   . GERD (gastroesophageal reflux disease)   . History of colonic polyps   . History of kidney stones   . History of seborrheic dermatitis   . HLD (hyperlipidemia)   . Hypertension   . Intussusception ([place])   . Lisfranc's sprain, left, initial encounter 10/06/2017  . Lumbar pain 2011  . Morbid obesity ([place])   . Nephrolithiasis    . Pneumonia   . Recurrent boils   . Sleep apnea   . Torn rotator cuff RT SHOULDER  . Tremor CCENTRAL NERVOUS SYSTEM        Review of Systems See HPI.     Objective:   Physical Exam Vitals reviewed.  Constitutional:      Appearance: She is obese.  HENT:     Head: Normocephalic.  Cardiovascular:     Rate and Rhythm: Normal rate and regular rhythm.  Pulmonary:     Effort: Pulmonary effort is normal.     Breath sounds: Normal breath sounds.  Musculoskeletal:     Comments: Not able to actively raise either arm above 90 degrees. All palpation of shoulder created a pain response.   No swelling of joints of right hand. Tender to palpation over right MCP of index and middle. No catching seen today. Pt reported it. No erythema/swelling/warmth over joint.   Neurological:     General: No focal deficit present.     Mental Status: She is alert and oriented to person, place, and time.  Psychiatric:        Mood and Affect: Mood normal.       .Marland Kitchen  Depression screen [facility] 2/9 09/23/2019 06/18/2019 02/11/2019 03/21/2018 10/20/2017  Decreased Interest 1 3 0 1 2  Down, Depressed, Hopeless 1 3 0 2 3  PHQ - 2 Score 2 6 0 3 5  Altered sleeping 1 3 2 1 3   Tired, decreased energy 3 3 2 3 3   Change in appetite 2 3 0 1 3  Feeling bad or failure about yourself  0 0 0 1 3  Trouble concentrating 0 1 0 1 3  Moving slowly or fidgety/restless 2 2 0 0 2  Suicidal thoughts 0 0 0 0 1  PHQ-9 Score 10 18 4 10 23   Difficult doing work/chores Very difficult Somewhat difficult Not difficult at all - -  Some recent data might be hidden   .Marland Kitchen GAD 7 : Generalized Anxiety Score 09/23/2019 06/18/2019 02/11/2019 03/21/2018  Nervous, Anxious, on Edge 1 1 1 2   Control/stop worrying 1 1 0 2  Worry too much - different things 1 2 2 2   Trouble relaxing 1 1 0 2  Restless 2 0 2 2  Easily annoyed or irritable 1 1 0 2  Afraid - awful might happen 0 0 0 1  Total GAD 7 Score 7 6 5 13   Anxiety Difficulty Somewhat difficult  Very difficult Not difficult at all Very difficult        Assessment & Plan:  Marland KitchenMarland KitchenZainab was seen today for diabetes.  Diagnoses and all orders for this visit:  Type 2 diabetes mellitus with diabetic neuropathy, with long-term current use of insulin (HCC) -     POCT glycosylated hemoglobin (Hb A1C)  Diabetic peripheral neuropathy (HCC) -     DULoxetine (CYMBALTA) 60 MG capsule; Take 2 capsules (120 mg total) by mouth daily.  Chronic pain syndrome -     HYDROcodone-acetaminophen (NORCO) 10-325 MG tablet; Take one/half tablet twice a day.  Chronic pain of both shoulders  Trigger index finger of right hand  Trigger finger, right middle finger  Hypertension associated with diabetes (HCC)  Stage 3a chronic kidney disease  SOB (shortness of breath) on exertion -     furosemide (LASIX) 20 MG tablet; Take 1 tablet (20 mg total) by mouth 2 (two) times daily.  Gastroparesis due to secondary diabetes (HCC) -     metoCLOPramide (REGLAN) 10 MG tablet; Take 0.5 tablets (5 mg total) by mouth 3 (three) times daily before meals.   .. Lab Results  Component Value Date   HGBA1C 6.9 (A) 12/23/2019   A1C is controlled.  Stay on same medications.  On STATIN.   BP not to goal. ACE caused BP to drop to much suddenly. Pt is in a lot of pain today. She will recheck at home. Last OV not this elevated. Bring in logs.  Needs eye exam.  Vaccines UTD.  Follow up in 3 months.   Medications refilled as needed.   Needs to see Dr. Darene Lamer for bilateral shoulder pain and finger catching index/middle right hand. Hx of rotator cuff tendinitis.   Marland KitchenMarland KitchenPDMP reviewed during this encounter. Refilled norco #45 a month.  On pain contract.

## 2019-12-25 ENCOUNTER — Ambulatory Visit: Payer: Medicare HMO | Admitting: Sports Medicine

## 2019-12-27 ENCOUNTER — Other Ambulatory Visit: Payer: Self-pay

## 2019-12-27 ENCOUNTER — Encounter: Payer: Self-pay | Admitting: Sports Medicine

## 2019-12-27 ENCOUNTER — Ambulatory Visit (INDEPENDENT_AMBULATORY_CARE_PROVIDER_SITE_OTHER): Payer: Medicare HMO | Admitting: Sports Medicine

## 2019-12-27 ENCOUNTER — Telehealth: Payer: Self-pay | Admitting: Physician Assistant

## 2019-12-27 DIAGNOSIS — M5030 Other cervical disc degeneration, unspecified cervical region: Secondary | ICD-10-CM

## 2019-12-27 DIAGNOSIS — M65321 Trigger finger, right index finger: Secondary | ICD-10-CM | POA: Diagnosis not present

## 2019-12-27 DIAGNOSIS — G894 Chronic pain syndrome: Secondary | ICD-10-CM

## 2019-12-27 MED ORDER — HYDROCODONE-ACETAMINOPHEN 10-325 MG PO TABS: ORAL_TABLET | ORAL | 0 refills | Status: DC

## 2019-12-27 MED ORDER — PREDNISONE 50 MG PO TABS: ORAL_TABLET | ORAL | 0 refills | Status: DC

## 2019-12-27 NOTE — Assessment & Plan Note (Signed)
Starting 5 days of prednisone. Home health physical therapy, return to see me in 1 month, MRI for interventional planning if no better.

## 2019-12-27 NOTE — Telephone Encounter (Signed)
Received fax for PA on Hydrocodone/Acet [phone] sent through cover my meds and it is approved only for a 30 day supply will not approved anything over 30 days. - CF

## 2019-12-27 NOTE — Telephone Encounter (Signed)
Signed.

## 2019-12-27 NOTE — Telephone Encounter (Signed)
This was written for #45 when direction are 1/2 tablet BID. Not sure what happened. Can you resign orders?

## 2019-12-27 NOTE — Addendum Note (Signed)
Addended byAnnamaria Helling on: 12/27/2019 02:07 PM   Modules accepted: Orders

## 2019-12-27 NOTE — Addendum Note (Signed)
Addended by: Donella Stade on: 12/27/2019 03:02 PM   Modules accepted: Orders

## 2019-12-27 NOTE — Assessment & Plan Note (Signed)
Stenosing tenosynovitis of the right second finger, injected today, home rehab exercises given. Return to see me in 1 month for this.

## 2019-12-27 NOTE — Progress Notes (Signed)
    Procedures performed today:    Procedure: Real-time Ultrasound Guided injection of the right second flexor tendon sheath of the hand Device: Samsung [id number]  Verbal informed consent obtained.  Time-out conducted.  Noted no overlying erythema, induration, or other signs of local infection.  Skin prepped in a sterile fashion.  Local anesthesia: Topical Ethyl chloride.  With sterile technique and under real time ultrasound guidance: 1/2 cc lidocaine, 1/2 cc Kenalog 40 injected easilyCompleted without difficulty  Pain immediately resolved suggesting accurate placement of the medication.  Advised to call if fevers/chills, erythema, induration, drainage, or persistent bleeding.  Images permanently stored and available for review in the ultrasound unit.  Impression: Technically successful ultrasound guided injection.  Independent interpretation of notes and tests performed by another provider:   None.  Impression and Recommendations:    Trigger index finger of right hand Stenosing tenosynovitis of the right second finger, injected today, home rehab exercises given. Return to see me in 1 month for this.  DDD (degenerative disc disease), cervical Starting 5 days of prednisone. Home health physical therapy, return to see me in 1 month, MRI for interventional planning if no better.    ___________________________________________ Gwen Her. Dianah Field, M.D., ABFM., CAQSM. Primary Care and [place] Instructor of [place] [employer] [facility] [employer]

## 2019-12-30 ENCOUNTER — Encounter: Payer: Self-pay | Admitting: Physician Assistant

## 2019-12-30 DIAGNOSIS — E1343 Other specified diabetes mellitus with diabetic autonomic (poly)neuropathy: Secondary | ICD-10-CM

## 2019-12-30 DIAGNOSIS — Z794 Long term (current) use of insulin: Secondary | ICD-10-CM

## 2019-12-30 DIAGNOSIS — I10 Essential (primary) hypertension: Secondary | ICD-10-CM

## 2019-12-30 DIAGNOSIS — N3281 Overactive bladder: Secondary | ICD-10-CM

## 2019-12-30 DIAGNOSIS — J301 Allergic rhinitis due to pollen: Secondary | ICD-10-CM

## 2019-12-30 DIAGNOSIS — E1140 Type 2 diabetes mellitus with diabetic neuropathy, unspecified: Secondary | ICD-10-CM

## 2019-12-30 DIAGNOSIS — E1142 Type 2 diabetes mellitus with diabetic polyneuropathy: Secondary | ICD-10-CM

## 2019-12-30 DIAGNOSIS — R0602 Shortness of breath: Secondary | ICD-10-CM

## 2019-12-30 DIAGNOSIS — K219 Gastro-esophageal reflux disease without esophagitis: Secondary | ICD-10-CM

## 2019-12-30 DIAGNOSIS — E118 Type 2 diabetes mellitus with unspecified complications: Secondary | ICD-10-CM

## 2019-12-31 MED ORDER — LEVOCETIRIZINE DIHYDROCHLORIDE 5 MG PO TABS: 5.0000 mg | ORAL_TABLET | Freq: Every evening | ORAL | 3 refills | Status: DC

## 2019-12-31 MED ORDER — BLOOD GLUCOSE METER KIT: 1.0000 | PACK | Freq: Four times a day (QID) | 0 refills | Status: DC

## 2020-01-01 MED ORDER — MECLIZINE HCL 25 MG PO TABS: 25.0000 mg | ORAL_TABLET | Freq: Three times a day (TID) | ORAL | 1 refills | Status: DC | PRN

## 2020-01-01 MED ORDER — GABAPENTIN 800 MG PO TABS: 800.0000 mg | ORAL_TABLET | Freq: Two times a day (BID) | ORAL | 1 refills | Status: DC

## 2020-01-01 MED ORDER — BD PEN NEEDLE MICRO U/F 32G X 6 MM MISC: 2 refills | Status: DC

## 2020-01-01 MED ORDER — BD SWAB SINGLE USE REGULAR PADS: 1.0000 | MEDICATED_PAD | Freq: Four times a day (QID) | 1 refills | Status: DC | PRN

## 2020-01-01 MED ORDER — FUROSEMIDE 20 MG PO TABS: 20.0000 mg | ORAL_TABLET | Freq: Two times a day (BID) | ORAL | 1 refills | Status: DC

## 2020-01-01 MED ORDER — METOCLOPRAMIDE HCL 10 MG PO TABS: 5.0000 mg | ORAL_TABLET | Freq: Three times a day (TID) | ORAL | 1 refills | Status: DC

## 2020-01-01 MED ORDER — TRUE METRIX LEVEL 1 LOW VI SOLN: 1.0000 | Freq: Every day | 0 refills | Status: DC | PRN

## 2020-01-01 MED ORDER — PIOGLITAZONE HCL 30 MG PO TABS: ORAL_TABLET | ORAL | 1 refills | Status: DC

## 2020-01-01 MED ORDER — VICTOZA 18 MG/3ML ~~LOC~~ SOPN: PEN_INJECTOR | SUBCUTANEOUS | 1 refills | Status: DC

## 2020-01-01 MED ORDER — FLUTICASONE PROPIONATE 50 MCG/ACT NA SUSP: 2.0000 | Freq: Every day | NASAL | 5 refills | Status: DC

## 2020-01-01 MED ORDER — PROPRANOLOL HCL 20 MG PO TABS: ORAL_TABLET | ORAL | 1 refills | Status: DC

## 2020-01-01 MED ORDER — TRESIBA FLEXTOUCH 200 UNIT/ML ~~LOC~~ SOPN: 40.0000 [IU] | PEN_INJECTOR | Freq: Every day | SUBCUTANEOUS | 1 refills | Status: DC

## 2020-01-01 MED ORDER — HYDROXYZINE HCL 50 MG PO TABS: 50.0000 mg | ORAL_TABLET | Freq: Three times a day (TID) | ORAL | 1 refills | Status: DC | PRN

## 2020-01-01 MED ORDER — DULOXETINE HCL 60 MG PO CPEP: 120.0000 mg | ORAL_CAPSULE | Freq: Every day | ORAL | 1 refills | Status: DC

## 2020-01-01 MED ORDER — MIRABEGRON ER 25 MG PO TB24: 25.0000 mg | ORAL_TABLET | Freq: Every day | ORAL | 1 refills | Status: DC

## 2020-01-01 MED ORDER — PRAVASTATIN SODIUM 10 MG PO TABS: 10.0000 mg | ORAL_TABLET | Freq: Every day | ORAL | 1 refills | Status: DC

## 2020-01-01 MED ORDER — FAMOTIDINE 20 MG PO TABS: 20.0000 mg | ORAL_TABLET | Freq: Two times a day (BID) | ORAL | 1 refills | Status: DC

## 2020-01-01 NOTE — Addendum Note (Signed)
Addended byAnnamaria Helling on: 01/01/2020 10:59 AM   Modules accepted: Orders

## 2020-01-01 NOTE — Telephone Encounter (Signed)
Received request for 17 other RX and sent.

## 2020-01-06 DIAGNOSIS — I10 Essential (primary) hypertension: Secondary | ICD-10-CM | POA: Diagnosis not present

## 2020-01-06 DIAGNOSIS — M6281 Muscle weakness (generalized): Secondary | ICD-10-CM | POA: Diagnosis not present

## 2020-01-06 DIAGNOSIS — K219 Gastro-esophageal reflux disease without esophagitis: Secondary | ICD-10-CM | POA: Diagnosis not present

## 2020-01-06 DIAGNOSIS — M5030 Other cervical disc degeneration, unspecified cervical region: Secondary | ICD-10-CM | POA: Diagnosis not present

## 2020-01-08 DIAGNOSIS — M5030 Other cervical disc degeneration, unspecified cervical region: Secondary | ICD-10-CM | POA: Diagnosis not present

## 2020-01-08 DIAGNOSIS — I10 Essential (primary) hypertension: Secondary | ICD-10-CM | POA: Diagnosis not present

## 2020-01-08 DIAGNOSIS — K219 Gastro-esophageal reflux disease without esophagitis: Secondary | ICD-10-CM | POA: Diagnosis not present

## 2020-01-08 DIAGNOSIS — M6281 Muscle weakness (generalized): Secondary | ICD-10-CM | POA: Diagnosis not present

## 2020-01-10 DIAGNOSIS — I10 Essential (primary) hypertension: Secondary | ICD-10-CM | POA: Diagnosis not present

## 2020-01-10 DIAGNOSIS — M5030 Other cervical disc degeneration, unspecified cervical region: Secondary | ICD-10-CM | POA: Diagnosis not present

## 2020-01-10 DIAGNOSIS — M6281 Muscle weakness (generalized): Secondary | ICD-10-CM | POA: Diagnosis not present

## 2020-01-10 DIAGNOSIS — K219 Gastro-esophageal reflux disease without esophagitis: Secondary | ICD-10-CM | POA: Diagnosis not present

## 2020-01-13 DIAGNOSIS — K219 Gastro-esophageal reflux disease without esophagitis: Secondary | ICD-10-CM | POA: Diagnosis not present

## 2020-01-13 DIAGNOSIS — M5030 Other cervical disc degeneration, unspecified cervical region: Secondary | ICD-10-CM | POA: Diagnosis not present

## 2020-01-13 DIAGNOSIS — M6281 Muscle weakness (generalized): Secondary | ICD-10-CM | POA: Diagnosis not present

## 2020-01-13 DIAGNOSIS — I10 Essential (primary) hypertension: Secondary | ICD-10-CM | POA: Diagnosis not present

## 2020-01-14 ENCOUNTER — Ambulatory Visit: Payer: Medicare HMO | Attending: Internal Medicine

## 2020-01-14 DIAGNOSIS — Z23 Encounter for immunization: Secondary | ICD-10-CM

## 2020-01-14 NOTE — Progress Notes (Signed)
   Covid-19 Vaccination Clinic  Name:  [name] Rice    MRN: [id number] DOB: 1956/04/30  01/14/2020  [name] was observed post Covid-19 immunization for 15 minutes without incident. She was provided with Vaccine Information Sheet and instruction to access the V-Safe system.   [name] was instructed to call 911 with any severe reactions post vaccine: Marland Kitchen Difficulty breathing  . Swelling of face and throat  . A fast heartbeat  . A bad rash all over body  . Dizziness and weakness   Immunizations Administered    Name Date Dose VIS Date Route   Pfizer COVID-19 Vaccine 01/14/2020  4:20 PM 0.3 mL 09/27/2019 Intramuscular   Manufacturer: [place]   Lot: [id number]   [place]: [id number]

## 2020-01-15 DIAGNOSIS — K219 Gastro-esophageal reflux disease without esophagitis: Secondary | ICD-10-CM | POA: Diagnosis not present

## 2020-01-15 DIAGNOSIS — M6281 Muscle weakness (generalized): Secondary | ICD-10-CM | POA: Diagnosis not present

## 2020-01-15 DIAGNOSIS — M5030 Other cervical disc degeneration, unspecified cervical region: Secondary | ICD-10-CM | POA: Diagnosis not present

## 2020-01-15 DIAGNOSIS — I10 Essential (primary) hypertension: Secondary | ICD-10-CM | POA: Diagnosis not present

## 2020-01-24 ENCOUNTER — Ambulatory Visit (INDEPENDENT_AMBULATORY_CARE_PROVIDER_SITE_OTHER): Payer: Medicare HMO | Admitting: Sports Medicine

## 2020-01-24 ENCOUNTER — Other Ambulatory Visit: Payer: Self-pay

## 2020-01-24 DIAGNOSIS — M5030 Other cervical disc degeneration, unspecified cervical region: Secondary | ICD-10-CM

## 2020-01-24 DIAGNOSIS — M65321 Trigger finger, right index finger: Secondary | ICD-10-CM | POA: Diagnosis not present

## 2020-01-24 DIAGNOSIS — M75102 Unspecified rotator cuff tear or rupture of left shoulder, not specified as traumatic: Secondary | ICD-10-CM

## 2020-01-24 DIAGNOSIS — M75101 Unspecified rotator cuff tear or rupture of right shoulder, not specified as traumatic: Secondary | ICD-10-CM

## 2020-01-24 DIAGNOSIS — R6889 Other general symptoms and signs: Secondary | ICD-10-CM | POA: Diagnosis not present

## 2020-01-24 NOTE — Progress Notes (Addendum)
    Procedures performed today:    None.  Independent interpretation of notes and tests performed by another provider:   None.  Brief History, Exam, Impression, and Recommendations:    DDD (degenerative disc disease), cervical Still has some neck pain, it did get significantly better with prednisone PT. Because there is still significant neck pain I am going to proceed with a cervical spine MRI, hopefully to be done this week and followed up by a cervical epidural.  Trigger index finger of right hand Almost completely better after flexor tendon sheath injection at the last visit. Very little triggering, she feels as though she can live with it now.  Bilateral rotator cuff syndrome Jaleese has bilateral shoulder pain today that she localizes over her deltoid, she has positive impingement signs on exam. She does need bilateral subacromial injections, and home rehab however we will delay this for at least another week, she had her COVID-19 vaccine about a week ago.    ___________________________________________ Gwen Her. Dianah Field, M.D., ABFM., CAQSM. Primary Care and [place] Instructor of [place] of [facility] of Medicine

## 2020-01-24 NOTE — Addendum Note (Signed)
Addended by: Silverio Decamp on: 01/24/2020 04:10 PM   Modules accepted: Orders

## 2020-01-24 NOTE — Assessment & Plan Note (Signed)
[name] Rice has bilateral shoulder pain today that she localizes over her deltoid, she has positive impingement signs on exam. She does need bilateral subacromial injections, and home rehab however we will delay this for at least another week, she had her COVID-19 vaccine about a week ago.

## 2020-01-24 NOTE — Assessment & Plan Note (Addendum)
Still has some neck pain, it did get significantly better with prednisone PT. Because there is still significant neck pain I am going to proceed with a cervical spine MRI, hopefully to be done this week and followed up by a cervical epidural.

## 2020-01-24 NOTE — Assessment & Plan Note (Signed)
Almost completely better after flexor tendon sheath injection at the last visit. Very little triggering, she feels as though she can live with it now.

## 2020-01-29 ENCOUNTER — Other Ambulatory Visit: Payer: Self-pay | Admitting: Neurology

## 2020-01-29 DIAGNOSIS — F331 Major depressive disorder, recurrent, moderate: Secondary | ICD-10-CM

## 2020-01-29 MED ORDER — BUPROPION HCL ER (XL) 150 MG PO TB24: ORAL_TABLET | ORAL | 1 refills | Status: DC

## 2020-01-31 ENCOUNTER — Ambulatory Visit (INDEPENDENT_AMBULATORY_CARE_PROVIDER_SITE_OTHER): Payer: Medicare HMO | Admitting: Sports Medicine

## 2020-01-31 ENCOUNTER — Other Ambulatory Visit: Payer: Self-pay

## 2020-01-31 DIAGNOSIS — M75101 Unspecified rotator cuff tear or rupture of right shoulder, not specified as traumatic: Secondary | ICD-10-CM

## 2020-01-31 DIAGNOSIS — M75102 Unspecified rotator cuff tear or rupture of left shoulder, not specified as traumatic: Secondary | ICD-10-CM

## 2020-01-31 NOTE — Progress Notes (Signed)
    Procedures performed today:    Procedure: Real-time Ultrasound Guided injection of the left subacromial bursa Device: Samsung HS60  Verbal informed consent obtained.  Time-out conducted.  Noted no overlying erythema, induration, or other signs of local infection.  Skin prepped in a sterile fashion.  Local anesthesia: Topical Ethyl chloride.  With sterile technique and under real time ultrasound guidance: 1 cc Kenalog 40, 1 cc lidocaine,1 cc bupivacaine injected easily Completed without difficulty  Pain immediately resolved suggesting accurate placement of the medication.  Advised to call if fevers/chills, erythema, induration, drainage, or persistent bleeding.  Images permanently stored and available for review in the ultrasound unit.  Impression: Technically successful ultrasound guided injection.  Procedure: Real-time Ultrasound Guided injection of the right subacromial bursa Device: Samsung HS60  Verbal informed consent obtained.  Time-out conducted.  Noted no overlying erythema, induration, or other signs of local infection.  Skin prepped in a sterile fashion.  Local anesthesia: Topical Ethyl chloride.  With sterile technique and under real time ultrasound guidance: 1 cc Kenalog 40, 1 cc lidocaine,1 cc bupivacaine injected easily Completed without difficulty  Pain immediately resolved suggesting accurate placement of the medication.  Advised to call if fevers/chills, erythema, induration, drainage, or persistent bleeding.  Images permanently stored and available for review in the ultrasound unit.  Impression: Technically successful ultrasound guided injection.  Independent interpretation of notes and tests performed by another provider:   None.  Brief History, Exam, Impression, and Recommendations:    Bilateral rotator cuff syndrome [name] Rice returns, she is a pleasant 64 year old female with bilateral shoulder pain, impingement symptoms, we added to home physical therapy,  her pain was so severe we had planned bilateral subacromial injections however she had had her COVID-19 vaccine at the last visit, we delayed the injections and further evaluation until today. Today she continues to have pain, I have evaluated this again, she continues to have impingement signs and thus necessitates bilateral subacromial injections, these were performed today, continue home rehab, return to see me in a month for this.    ___________________________________________ Gwen Her. Dianah Field, M.D., ABFM., CAQSM. Primary Care and [place] Instructor of [place] of [facility] of Medicine

## 2020-01-31 NOTE — Assessment & Plan Note (Signed)
[name] Rice returns, she is a pleasant 64 year old female with bilateral shoulder pain, impingement symptoms, we added to home physical therapy, her pain was so severe we had planned bilateral subacromial injections however she had had her COVID-19 vaccine at the last visit, we delayed the injections and further evaluation until today. Today she continues to have pain, I have evaluated this again, she continues to have impingement signs and thus necessitates bilateral subacromial injections, these were performed today, continue home rehab, return to see me in a month for this.

## 2020-02-01 ENCOUNTER — Ambulatory Visit (INDEPENDENT_AMBULATORY_CARE_PROVIDER_SITE_OTHER): Payer: Medicare HMO

## 2020-02-01 DIAGNOSIS — M4802 Spinal stenosis, cervical region: Secondary | ICD-10-CM | POA: Diagnosis not present

## 2020-02-01 DIAGNOSIS — M5030 Other cervical disc degeneration, unspecified cervical region: Secondary | ICD-10-CM

## 2020-02-03 DIAGNOSIS — J449 Chronic obstructive pulmonary disease, unspecified: Secondary | ICD-10-CM | POA: Diagnosis not present

## 2020-02-03 DIAGNOSIS — G4733 Obstructive sleep apnea (adult) (pediatric): Secondary | ICD-10-CM | POA: Diagnosis not present

## 2020-02-04 DIAGNOSIS — Z6841 Body Mass Index (BMI) 40.0 and over, adult: Secondary | ICD-10-CM | POA: Diagnosis not present

## 2020-02-04 DIAGNOSIS — S93324S Dislocation of tarsometatarsal joint of right foot, sequela: Secondary | ICD-10-CM | POA: Diagnosis not present

## 2020-02-04 DIAGNOSIS — E119 Type 2 diabetes mellitus without complications: Secondary | ICD-10-CM | POA: Diagnosis not present

## 2020-02-04 DIAGNOSIS — M13871 Other specified arthritis, right ankle and foot: Secondary | ICD-10-CM | POA: Diagnosis not present

## 2020-02-04 DIAGNOSIS — H269 Unspecified cataract: Secondary | ICD-10-CM | POA: Diagnosis not present

## 2020-02-04 DIAGNOSIS — J449 Chronic obstructive pulmonary disease, unspecified: Secondary | ICD-10-CM | POA: Diagnosis not present

## 2020-02-04 DIAGNOSIS — E1140 Type 2 diabetes mellitus with diabetic neuropathy, unspecified: Secondary | ICD-10-CM | POA: Diagnosis not present

## 2020-02-04 DIAGNOSIS — F329 Major depressive disorder, single episode, unspecified: Secondary | ICD-10-CM | POA: Diagnosis not present

## 2020-02-04 DIAGNOSIS — N183 Chronic kidney disease, stage 3 unspecified: Secondary | ICD-10-CM | POA: Diagnosis not present

## 2020-02-04 DIAGNOSIS — G8918 Other acute postprocedural pain: Secondary | ICD-10-CM | POA: Diagnosis not present

## 2020-02-04 DIAGNOSIS — M1990 Unspecified osteoarthritis, unspecified site: Secondary | ICD-10-CM | POA: Diagnosis not present

## 2020-02-04 DIAGNOSIS — Z981 Arthrodesis status: Secondary | ICD-10-CM | POA: Diagnosis not present

## 2020-02-04 DIAGNOSIS — I10 Essential (primary) hypertension: Secondary | ICD-10-CM | POA: Diagnosis not present

## 2020-02-04 DIAGNOSIS — K219 Gastro-esophageal reflux disease without esophagitis: Secondary | ICD-10-CM | POA: Diagnosis not present

## 2020-02-04 DIAGNOSIS — Z20822 Contact with and (suspected) exposure to covid-19: Secondary | ICD-10-CM | POA: Diagnosis not present

## 2020-02-04 DIAGNOSIS — T84020D Dislocation of internal right hip prosthesis, subsequent encounter: Secondary | ICD-10-CM | POA: Diagnosis not present

## 2020-02-04 DIAGNOSIS — E785 Hyperlipidemia, unspecified: Secondary | ICD-10-CM | POA: Diagnosis not present

## 2020-02-04 DIAGNOSIS — G4730 Sleep apnea, unspecified: Secondary | ICD-10-CM | POA: Diagnosis not present

## 2020-02-04 DIAGNOSIS — M960 Pseudarthrosis after fusion or arthrodesis: Secondary | ICD-10-CM | POA: Diagnosis not present

## 2020-02-04 DIAGNOSIS — M19071 Primary osteoarthritis, right ankle and foot: Secondary | ICD-10-CM | POA: Diagnosis not present

## 2020-02-11 ENCOUNTER — Telehealth: Payer: Self-pay

## 2020-02-11 NOTE — Telephone Encounter (Signed)
FYI: Pt wanted to call with an update stating that her arm feels wonderful! She is still a little sore, but if it was not for the injections she would not be able to get in and out of bed. Pt is very appreciative of the care.

## 2020-02-13 DIAGNOSIS — E1140 Type 2 diabetes mellitus with diabetic neuropathy, unspecified: Secondary | ICD-10-CM | POA: Diagnosis not present

## 2020-02-13 DIAGNOSIS — J449 Chronic obstructive pulmonary disease, unspecified: Secondary | ICD-10-CM | POA: Diagnosis not present

## 2020-02-13 DIAGNOSIS — G4730 Sleep apnea, unspecified: Secondary | ICD-10-CM | POA: Diagnosis not present

## 2020-02-13 DIAGNOSIS — H269 Unspecified cataract: Secondary | ICD-10-CM | POA: Diagnosis not present

## 2020-02-13 DIAGNOSIS — T84020D Dislocation of internal right hip prosthesis, subsequent encounter: Secondary | ICD-10-CM | POA: Diagnosis not present

## 2020-02-13 DIAGNOSIS — F329 Major depressive disorder, single episode, unspecified: Secondary | ICD-10-CM | POA: Diagnosis not present

## 2020-02-13 DIAGNOSIS — M1990 Unspecified osteoarthritis, unspecified site: Secondary | ICD-10-CM | POA: Diagnosis not present

## 2020-02-13 DIAGNOSIS — Z981 Arthrodesis status: Secondary | ICD-10-CM | POA: Diagnosis not present

## 2020-02-13 MED ORDER — POLYETHYLENE GLYCOL 3350 17 GM/SCOOP PO POWD
17.00 | ORAL | Status: DC
Start: 2020-02-14 — End: 2020-02-13

## 2020-02-13 MED ORDER — HYDROCODONE-ACETAMINOPHEN 10-325 MG PO TABS
1.00 | ORAL_TABLET | ORAL | Status: DC
Start: ? — End: 2020-02-13

## 2020-02-13 MED ORDER — RIVAROXABAN 10 MG PO TABS
10.00 | ORAL_TABLET | ORAL | Status: DC
Start: 2020-02-13 — End: 2020-02-13

## 2020-02-13 MED ORDER — SODIUM CHLORIDE 0.9 % IV SOLN
10.00 | INTRAVENOUS | Status: DC
Start: ? — End: 2020-02-13

## 2020-02-13 MED ORDER — GENERIC EXTERNAL MEDICATION
Status: DC
Start: ? — End: 2020-02-13

## 2020-02-13 MED ORDER — DULOXETINE HCL 60 MG PO CPEP
120.00 | ORAL_CAPSULE | ORAL | Status: DC
Start: 2020-02-14 — End: 2020-02-13

## 2020-02-13 MED ORDER — INSULIN GLARGINE 100 UNIT/ML ~~LOC~~ SOLN
1.00 | SUBCUTANEOUS | Status: DC
Start: 2020-02-13 — End: 2020-02-13

## 2020-02-13 MED ORDER — METOCLOPRAMIDE HCL 10 MG PO TABS
5.00 | ORAL_TABLET | ORAL | Status: DC
Start: 2020-02-13 — End: 2020-02-13

## 2020-02-13 MED ORDER — LORATADINE 10 MG PO TABS
10.00 | ORAL_TABLET | ORAL | Status: DC
Start: 2020-02-14 — End: 2020-02-13

## 2020-02-13 MED ORDER — HYDROXYZINE HCL 25 MG PO TABS
50.00 | ORAL_TABLET | ORAL | Status: DC
Start: ? — End: 2020-02-13

## 2020-02-13 MED ORDER — PRAVASTATIN SODIUM 10 MG PO TABS
10.00 | ORAL_TABLET | ORAL | Status: DC
Start: 2020-02-13 — End: 2020-02-13

## 2020-02-13 MED ORDER — GABAPENTIN 400 MG PO CAPS
800.00 | ORAL_CAPSULE | ORAL | Status: DC
Start: 2020-02-13 — End: 2020-02-13

## 2020-02-13 MED ORDER — TRAMADOL HCL 50 MG PO TABS
50.00 | ORAL_TABLET | ORAL | Status: DC
Start: ? — End: 2020-02-13

## 2020-02-13 MED ORDER — ALUM & MAG HYDROXIDE-SIMETH 200-200-20 MG/5ML PO SUSP
30.00 | ORAL | Status: DC
Start: ? — End: 2020-02-13

## 2020-02-13 MED ORDER — INSULIN LISPRO 100 UNIT/ML ~~LOC~~ SOLN
1.00 | SUBCUTANEOUS | Status: DC
Start: 2020-02-13 — End: 2020-02-13

## 2020-02-13 MED ORDER — MIRABEGRON ER 25 MG PO TB24
25.00 | ORAL_TABLET | ORAL | Status: DC
Start: 2020-02-14 — End: 2020-02-13

## 2020-02-13 MED ORDER — FUROSEMIDE 20 MG PO TABS
20.00 | ORAL_TABLET | ORAL | Status: DC
Start: 2020-02-14 — End: 2020-02-13

## 2020-02-13 MED ORDER — ACETAMINOPHEN 325 MG PO TABS
650.00 | ORAL_TABLET | ORAL | Status: DC
Start: 2020-02-13 — End: 2020-02-13

## 2020-02-13 MED ORDER — LORAZEPAM 0.5 MG PO TABS
0.50 | ORAL_TABLET | ORAL | Status: DC
Start: ? — End: 2020-02-13

## 2020-02-13 MED ORDER — BUPROPION HCL ER (XL) 150 MG PO TB24
150.00 | ORAL_TABLET | ORAL | Status: DC
Start: 2020-02-14 — End: 2020-02-13

## 2020-02-13 MED ORDER — PROPRANOLOL HCL 10 MG PO TABS
20.00 | ORAL_TABLET | ORAL | Status: DC
Start: 2020-02-13 — End: 2020-02-13

## 2020-02-13 MED ORDER — INSULIN LISPRO 100 UNIT/ML ~~LOC~~ SOLN
1.00 | SUBCUTANEOUS | Status: DC
Start: ? — End: 2020-02-13

## 2020-02-13 MED ORDER — HYDROMORPHONE HCL 1 MG/ML IJ SOLN
0.20 | INTRAMUSCULAR | Status: DC
Start: ? — End: 2020-02-13

## 2020-02-13 MED ORDER — HYDROMORPHONE HCL 1 MG/ML IJ SOLN
0.50 | INTRAMUSCULAR | Status: DC
Start: ? — End: 2020-02-13

## 2020-02-13 MED ORDER — DIPHENHYDRAMINE HCL 25 MG PO CAPS
25.00 | ORAL_CAPSULE | ORAL | Status: DC
Start: ? — End: 2020-02-13

## 2020-02-16 ENCOUNTER — Encounter: Payer: Self-pay | Admitting: Physician Assistant

## 2020-02-16 DIAGNOSIS — G894 Chronic pain syndrome: Secondary | ICD-10-CM

## 2020-02-17 ENCOUNTER — Telehealth: Payer: Self-pay | Admitting: Family Medicine

## 2020-02-17 MED ORDER — HYDROCODONE-ACETAMINOPHEN 10-325 MG PO TABS: 0.5000 | ORAL_TABLET | Freq: Two times a day (BID) | ORAL | 0 refills | Status: DC | PRN

## 2020-02-17 NOTE — Telephone Encounter (Signed)
I talked with Dr Madilyn Fireman- she did not realize patient was seen in March.   Please still call pt and let her know that short supply was sent in but patient needs appt to renew pain contract etc. This appt needs to be in person.   Thanks!

## 2020-02-17 NOTE — Telephone Encounter (Signed)
Last RX was sent on 12/31/19 for #45 with directions for patient to take 1/2 to 1 tablet BID.   Pain contract is written for 1/2 tab BID and completed on 07/08/19  Last OV with PCP was on 12/23/19.  Can you please let me know if anything needs to be done prior to refill? Do we need to have her come in to update pain contract since she is taking differently?   If she is taking 1/2 pill BID then RX should have lasted 45 days. It has been 48 days since last RX sent over.   RX pended, please advise.

## 2020-02-17 NOTE — Telephone Encounter (Signed)
Please call patient: She is on chronic pain medication and is on pain management contract.  She has not been seen by her PCP in 6 months.  I did send over 5 days worth of medication she is welcome to make an appointment with one of our APP since [name] is out of the office this week as we need to make sure that the medication is being used appropriately, and needs an updated contract on file.

## 2020-02-20 NOTE — Telephone Encounter (Signed)
Can we please call this patient and get her scheduled, per note below?  She is not almost out of the short supply that was sent in

## 2020-02-24 DIAGNOSIS — M19079 Primary osteoarthritis, unspecified ankle and foot: Secondary | ICD-10-CM | POA: Diagnosis not present

## 2020-02-24 DIAGNOSIS — R6889 Other general symptoms and signs: Secondary | ICD-10-CM | POA: Diagnosis not present

## 2020-02-25 ENCOUNTER — Ambulatory Visit: Payer: Medicare HMO | Admitting: Nurse Practitioner

## 2020-02-26 ENCOUNTER — Other Ambulatory Visit: Payer: Self-pay

## 2020-02-26 NOTE — Patient Outreach (Signed)
[place] [facility]) Care Management  02/26/2020  [name] Rice 1956-09-03 [id number]   Telephone Screen  Referral Date: 02/26/2020 Referral Source: [facility] CM Dept. Referral Reason: "SSBCI meals, help with managing after recent foot surgery,diabetes" Insurance: [employer]   Outreach attempt # 1 to patient. No answer after several rings and unable to leave message.     Plan: RN CM will make outreach attempt to patient within 3-4 business days. RN CM will send unsuccessful outreach letter to patient.   Enzo Montgomery, RN,BSN,CCM [place] Management Telephonic Care Management Coordinator Direct Phone: [phone] Toll Free: [phone] Fax: [phone]

## 2020-02-27 ENCOUNTER — Other Ambulatory Visit: Payer: Self-pay

## 2020-02-27 NOTE — Patient Outreach (Signed)
[place] [facility]) Care Management  02/27/2020  [name] Rice 1956-04-20 [id number]   Telephone Screen  Referral Date: 02/26/2020 Referral Source: [facility] CM Dept. Referral Reason: "SSBCI meals, help with managing after recent foot surgery,diabetes" Insurance: [employer]    RN CM received message that patient had called [facility] office. Return call placed to patient. No answer at present. RN CM left HIPAA compliant voicemail message along with contact info.      Plan: RN CM will make outreach attempt to patient within 3-4 business days.   Enzo Montgomery, RN,BSN,CCM [place] Management Telephonic Care Management Coordinator Direct Phone: [phone] Toll Free: [phone] Fax: [phone]

## 2020-02-28 ENCOUNTER — Other Ambulatory Visit: Payer: Self-pay

## 2020-02-28 ENCOUNTER — Encounter: Payer: Self-pay | Admitting: Physician Assistant

## 2020-02-28 DIAGNOSIS — G894 Chronic pain syndrome: Secondary | ICD-10-CM

## 2020-02-28 MED ORDER — HYDROCODONE-ACETAMINOPHEN 10-325 MG PO TABS: 0.5000 | ORAL_TABLET | Freq: Two times a day (BID) | ORAL | 0 refills | Status: DC | PRN

## 2020-02-28 NOTE — Patient Outreach (Signed)
[place] [facility]) Care Management  02/28/2020  [name] Rice 1956-05-08 [id number]   Telephone Screen  Referral Date:02/26/2020 Referral [place] Dept. Referral Reason:"SSBCI meals, help with managing after recent foot surgery,diabetes" Insurance:[employer] Medicare    Voicemail message received from patient. Return call placed to patient. Spoke with patient. She reports that she had recent foot surgery. She was at [facility] rehab center. However, due to not liking it there she left after only being there for 24 hrs. She reports she is now home and unable to care for herself. She has a grandson that stays with her but works during the day and unable to assist her. Also, due to him being a female she does not want him to assist her with certain things. She voices that the only other family she has is a daughter in [place] and niece. However, she states that they are not able to provide much assistance to her. Patient inquiring about PCS services. Advised her that this is not covered under Medicare. She states that she knows her income is too high for Medicaid. She voices that she is unable to afford these services out of pocket. Patient reports that she would like to go back to another facility for rehab stay. She does not have one in mind. She reports she saw surgeon on 02/24/2020 and goes to see Sports Medicine MD on Monday. She will discuss with MD regarding submitting paperwork to have her placed. RN CM discussed [facility] SW role and possible assistance and patient gave verbal consent. RN CM discussed with patient the need to seek medical attention if she felt unsafe at home and she voiced understanding. Discussed with patient SSBCI meals an that this is a service provided through [facility] for extra meals to assist her. However, [facility] unable to authorize meals and that she must speak with [facility] staff member. She reports that she has already spoken with someone a few days ago and  provided them with the info.    Plan: RN CM will send [facility] SW referral for possible assistance with placement.   Enzo Montgomery, RN,BSN,CCM [place] Management Telephonic Care Management Coordinator Direct Phone: [phone] Toll Free: [phone] Fax: [phone]

## 2020-02-28 NOTE — Telephone Encounter (Signed)
Routing to covering provider. Rx last written on 02/17/20, #10. Rx pended.

## 2020-03-02 ENCOUNTER — Encounter: Payer: Self-pay | Admitting: *Deleted

## 2020-03-02 ENCOUNTER — Other Ambulatory Visit: Payer: Self-pay | Admitting: *Deleted

## 2020-03-02 ENCOUNTER — Other Ambulatory Visit: Payer: Self-pay

## 2020-03-02 ENCOUNTER — Ambulatory Visit (INDEPENDENT_AMBULATORY_CARE_PROVIDER_SITE_OTHER): Payer: Medicare HMO | Admitting: Sports Medicine

## 2020-03-02 DIAGNOSIS — G894 Chronic pain syndrome: Secondary | ICD-10-CM

## 2020-03-02 DIAGNOSIS — M75102 Unspecified rotator cuff tear or rupture of left shoulder, not specified as traumatic: Secondary | ICD-10-CM

## 2020-03-02 DIAGNOSIS — M5030 Other cervical disc degeneration, unspecified cervical region: Secondary | ICD-10-CM | POA: Diagnosis not present

## 2020-03-02 DIAGNOSIS — M75101 Unspecified rotator cuff tear or rupture of right shoulder, not specified as traumatic: Secondary | ICD-10-CM

## 2020-03-02 MED ORDER — HYDROCODONE-ACETAMINOPHEN 10-325 MG PO TABS: 1.0000 | ORAL_TABLET | Freq: Two times a day (BID) | ORAL | 0 refills | Status: DC | PRN

## 2020-03-02 NOTE — Assessment & Plan Note (Signed)
Omeka returns, we did bilateral subacromial injections the last visit and she returns today pain-free, she is having a bit of discomfort as she is having to use her wheelchair post foot and ankle surgery, I have asked her to discuss a temporary motorized scooter with her surgeons.

## 2020-03-02 NOTE — Assessment & Plan Note (Signed)
[name] gets #60 high-dose hydrocodones from her PCP, her PCP is currently out, so I will do this refill, she does understand that further refills need to be from [facility], [place].

## 2020-03-02 NOTE — Assessment & Plan Note (Signed)
[name] Rice does continue to have neck pain, she did have a C6-C7 disc protrusion on MRI, small, she also has a T1-T2 protrusion. Because she has failed conservative measures it is reasonable now to refer her to Dr. Dorene Ar for consideration of a cervical epidural.

## 2020-03-02 NOTE — Progress Notes (Signed)
    Procedures performed today:    None.  Independent interpretation of notes and tests performed by another provider:   None.  Brief History, Exam, Impression, and Recommendations:    Bilateral rotator cuff syndrome [name] Rice returns, we did bilateral subacromial injections the last visit and she returns today pain-free, she is having a bit of discomfort as she is having to use her wheelchair post foot and ankle surgery, I have asked her to discuss a temporary motorized scooter with her surgeons.  DDD (degenerative disc disease), cervical [name] Rice does continue to have neck pain, she did have a C6-C7 disc protrusion on MRI, small, she also has a T1-T2 protrusion. Because she has failed conservative measures it is reasonable now to refer her to Dr. Dorene Ar for consideration of a cervical epidural.  Chronic pain syndrome [name] Rice gets #60 high-dose hydrocodones from her PCP, her PCP is currently out, so I will do this refill, she does understand that further refills need to be from [facility].    ___________________________________________ Gwen Her. Dianah Field, M.D., ABFM., CAQSM. Primary Care and [place] Instructor of [place] of [facility] of Medicine

## 2020-03-02 NOTE — Patient Outreach (Addendum)
[place] [facility]) Care Rice  03/02/2020  [name] Rice [birth year] [id number]   CSW was able to make initial contact with patient today to perform phone assessment, as well as assess and assist with social work needs and services.  CSW introduced self, explained role and types of services provided through [name] Rice ([place] Rice).  CSW further explained to patient that CSW works with patient's RNCM, also with [name] Rice, [name] Rice.  CSW then explained the reason for the call, indicating that [name] Rice thought that patient would benefit from social work services and resources to assist with urgent placement into a skilled nursing facility to receive short-term rehabilitative services.  CSW obtained two HIPAA compliant identifiers from patient, which included patient's name and date of birth.  Patient reported that she was recently discharged from the hospital, after undergoing foot surgery, and placed at [facility] [place], to receive short-term rehabilitative services.  Patient admitted that she was only at [facility] from 1:30pm until 4:00am, when she contacted her grandson to come and pick her up to take her home.  Patient stated, "I never saw a nurse, a nurse tech, a physician, or anyone else for that matter, until staff at the facility learned that I was leaving [place] [facility]Against Medical Advice)".  Patient went on to say that she had not received any pain medications from the time she was discharged from the hospital until she returned home, having experienced excruciating pain during that time period.  Now that patient is home, she reported that she is unable to care for herself independently, nor is she able to perform activities of daily living without maximum assistance from her daughter, [name] Rice, with whom patient currently resides.  Patient admitted that she just lays in  bed all day, while [name] Rice is at work, unable to bear weight on her foot.  Patient denied having the financial means to be able to afford to pay for in-home care services out-of-pocket, and home health services were not arranged for patient prior to leaving [facility], since patient left the facility AMA.  Patient is requesting that CSW assist her with pursing alternate skilled nursing placement arrangements so that she may be able to finish out her course of treatment (physical and occupational therapies).  CSW explained to patient that CSW will first need to contact patient's insurance provider, [name] Rice Medicare, to inquire as to whether or not they will be willing to pay for patient to be placed into another skilled nursing facility for rehabilitative services.  In addition, CSW agreed to contact the social worker/discharge planner at [facility] to request that they fax CSW a copy of patient's most recent FL-2 Form.  In the meantime, CSW encouraged patient to familiarize herself with all the skilled nursing facilities in [place], [place] so that once CSW is able to obtain patient's FL-2 Form, CSW can begin pursuing bed offers by faxing patient's FL-2 Form to all facilities of interest.  Patient stated that she would prefer a facility close to home, making it convenient for [name] Rice to visit her.  Patient offered up the following two facilities of interest:  [facility] and [place]; however, CSW politely explained to patient that neither of these facilities will be able to accommodate her needs, as they do not offer rehabilitative services.  Patient voiced understanding, agreeing to begin a new search, looking specifically for skilled nursing facilities  that offer short-term rehab.  In talking with a representative from [facility] [place] learned that patient will need to be assigned a [employer] Medicare case worker, to assist with the whole  placement process.  The [facility] Medicare case worker will provide patient with a list of skilled nursing facilities in network, as well as provide prior authorization, once a bed offer is received.    The [facility] Medicare representative further reported that patient's 43 Assistant, [name] Rice with [place], will need to write patient a new order for skilled nursing placement, including in the order that patient left the previous skilled nursing facility AMA, due to not receiving pain medications for the entire 14 1/2 hours that she was residing at [facility].  Patient reported that [name] Rice has been out-of-the-office for the past couple of weeks, encouraging CSW to speak directly with [name] Rice, Nurse Practitioner covering for [name] Rice, in her absence.  CSW agreed to contact [name] Rice, and/or [name] Rice, to make the request, in addition to explaining to them that patient needs a new prescription for pain medications.  Patient reported that she has an appointment scheduled with [name] Rice, Family Medicine and Sports Medicine Physician, today (Monday, Mar 02, 2020) at 4:15pm, also at [facility], to follow-up regarding the bulging disc in her neck, hoping that [name] Rice will prescribe pain medications to get her through until [name] Rice returns to the office.  Otherwise, patient reported that she will try to see [name] Rice and/or [name] Rice today while she is in the office for her appointment, to request a new prescription for pain medications.  CSW agreed to follow-up with patient as soon as CSW is able to get patient assigned to a [employer] case worker, and a new order for skilled nursing placement is received and submitted to [facility] for processing.  Nat Christen, BSW, MSW, LCSW  Licensed Education officer, [facility]  Mailing  [place] [address], [place], [place] [address] Physical Address-[address], [place], [place] [address] Toll Free Main # [phone] Fax # [phone] Cell # [phone]  Office # [phone] [email or web address]

## 2020-03-05 DIAGNOSIS — Z969 Presence of functional implant, unspecified: Secondary | ICD-10-CM | POA: Diagnosis not present

## 2020-03-05 DIAGNOSIS — Z981 Arthrodesis status: Secondary | ICD-10-CM | POA: Diagnosis not present

## 2020-03-06 ENCOUNTER — Encounter: Payer: Self-pay | Admitting: *Deleted

## 2020-03-09 ENCOUNTER — Other Ambulatory Visit: Payer: Self-pay | Admitting: *Deleted

## 2020-03-09 NOTE — Patient Outreach (Signed)
[facility]) Care Management  03/09/2020  [name] Rice [birth year] [id number]   CSW was able to make contact with patient today to follow-up regarding social work services and resources, as well as to inquire about patient's current wishes, whether it be to remain at home and receive in-home care services, versus placement into a skilled nursing facility for short-term rehabilitative services, versus long-term care placement into an assisted living facility.  Patient admitted that she is no longer interested in receiving in-home care services, nor is she interested in short-term rehabilitative services, but would now like for CSW to assist her with pursing long-term care placement into an assisted living facility.  Patient reported that she is also working with Harrel Carina, representative with [facility], in regards to pursing assisted living placement.  Patient was unable to recall the contact information for Mrs. Ronnald Ramp, but agreed to provide Mrs. Jones with CSW's contact information so that we can coordinate efforts.  Patient went on to say that her 59 Assistant, Iran Planas with [facility], is currently on leave, encouraging CSW to contact Dr. Emogene Morgan Daws, Foot and Ankle Orthopedic Surgeon with [facility], as Dr. Tawni Millers Nurse, Sharyn Lull [phone]) has also offered to provide assistance.  Patient indicated that Sharyn Lull is trying to assist her with obtaining an electric wheelchair, but also agreed to assist with long-term care placement, if necessary.  CSW made an attempt to try and contact Sharyn Lull to discuss placement for patient; however, Sharyn Lull was unavailable at the time of CSW's call.  CSW left a HIPAA compliant message on voicemail for Sharyn Lull and is currently awaiting a return call.  CSW will request that Sharyn Lull assist CSW with obtaining an order for long-term care assisted living placement for  patient, from Dr. Tawni Millers, to submit directly to [facility] for prior approval and authorization, a requirement of [facility] Medicare.  In addition, CSW will request that Sharyn Lull assist CSW with obtaining a completed and signed FL-2 Form for patient, again from Dr. Tawni Millers, so that [place] can begin pursing assisted living placement for patient into a long-term care facility.  Once the completed and signed FL-2 Form is received, CSW agreed to fax the form to all facilities of interest to try and pursue bed offers.  CSW explained to patient that she will be required to show proof of having had a negative TB Skin Test within the last 6 months, or a negative Chest X-ray within the last year.  Patient will also be required to provide documentation of a negative COVID-19 Screening within 7 days of admission into the assisted living facility.  In the meantime, CSW encouraged patient to go ahead and apply for [place] Medicaid, through the [facility], as patient will need a payor source at the facility, in addition to her [place].  Patient admitted that she would really like to continue to reside in [place], [place], if at all possible.  CSW voiced understanding, agreeing to pursue bed offers in that specific location.  CSW also agreed to follow-up with patient as soon as CSW is able to converse with Sharyn Lull, as well as obtain the order for placement and the completed and signed FL-2 Form from Dr. Tawni Millers, or in one week, on Tuesday, March 17, 2020, around [place], [place], MSW, [employer]  Licensed Clinical Social Worker  [place]  Mailing [phone]  [address] [place], [place] [address] Physical Address-[address], [place], [place] [address] Toll Free Main # [phone] Fax # [phone] Cell # [phone]  Office #  [phone] [email or web address]

## 2020-03-10 ENCOUNTER — Other Ambulatory Visit: Payer: Self-pay | Admitting: *Deleted

## 2020-03-10 NOTE — Patient Outreach (Signed)
[facility]) Care Management  03/10/2020  [name] Rice 1956-09-24 [id number]   CSW received an incoming call from patient this morning, indicating that she has changed her mind about wanting to pursue long-term care placement into an assisted living facility.  Patient admitted that she just recently moved into a handicapped accessible apartment, finally getting it furnished just the way she wanted.  CSW voiced understanding, but explained to patient that this does not eliminate the fact that she is unable to ambulate without assistance or perform activities of daily living independently.  CSW encouraged patient to at least consider receiving home health services, in the form of nursing, physical therapy, occupational therapy and an aid, to help her regain her strength and mobility.    Patient indicated that she would be agreeable to receiving home health services, but understands that these services will only be short-term and that she will only receive the aid for as long as she is receiving skilled care in the home.  CSW agreed to contact Sharyn Lull, Nurse for Dr. Emogene Morgan Daws, Foot and Ankle Orthopedic Surgeon with [place] [facility] [phone]), to request the order for home health services, as well as request that Sharyn Lull initiate arrangement of home health services.  Sharyn Lull was agreeable to this plan, so CSW provided her with a list of home health agencies in [place] that accept [employer], patient's insurance provider.  CSW agreed to follow-up with patient again next week, on Tuesday, March 17, 2020, around 9:00am, to ensure that home health services are in place and that patient is able to better manage her own care in the home, as patient currently lives alone.  CSW offered to mail patient a list of in-home care agencies to utilize, once home health services have been discontinued, but explained to patient that these services would be paid  for out-of-pocket, as Medicare does not cover the expense unless there is a skilled need in the home.  Patient denied having an interest in receiving the list, admitting that she is on a very fixed income, unable to afford any additional expenses each month.  Patient did not think she would be eligible for Adult Medicaid.  Nat Christen, BSW, MSW, LCSW  Licensed Education officer, [facility]  Mailing [place] [address] [place], [place] [address] Physical Address-[address], [place], [place] [address] Toll Free Main # [phone] Fax # [phone] Cell # [phone]  Office # [phone] [email or web address]

## 2020-03-11 ENCOUNTER — Other Ambulatory Visit: Payer: Self-pay | Admitting: Physician Assistant

## 2020-03-17 ENCOUNTER — Encounter: Payer: Self-pay | Admitting: *Deleted

## 2020-03-17 ENCOUNTER — Other Ambulatory Visit: Payer: Self-pay | Admitting: *Deleted

## 2020-03-17 NOTE — Patient Outreach (Signed)
[facility]) Care Management  03/17/2020  [name] Rice November 17, 1955 [id number]   CSW was able to make contact with patient today to follow-up regarding social work services and resources, as well as to confirm that home health services have been arranged for patient.  Patient confirmed that home health services, in the form of nursing, physical therapy, occupational therapy and an aid, have been arranged for her by Dr. Emogene Morgan Daws, Foot and Ankle Orthopedic Surgeon with [place] [facility].  Patient believes that she will be able to live independently again, once she is able to regain strength and mobility.  CSW will perform a case closure on patient, as all goals of treatment have been met from social work standpoint and no additional social work needs have been identified at this time.  CSW will notify patient's Telephonic RNCM with [place] [facility], Enzo Montgomery of CSW's plans to close patient's case.  CSW will fax an update to patient's Nurse Practitioner, Iran Planas with [facility] to ensure that she is aware of CSW's involvement with patient's plan of care.    Nat Christen, BSW, MSW, LCSW  Licensed Education officer, environmental Health System  Mailing [place] [address], [place], [place] [address] Physical Address-[address], [place], [place] [address] Toll Free Main # [phone] Fax # [phone] Cell # [phone]  Office # [phone] [email or web address]

## 2020-03-20 DIAGNOSIS — M5030 Other cervical disc degeneration, unspecified cervical region: Secondary | ICD-10-CM | POA: Diagnosis not present

## 2020-03-20 DIAGNOSIS — R6889 Other general symptoms and signs: Secondary | ICD-10-CM | POA: Diagnosis not present

## 2020-03-20 DIAGNOSIS — M4722 Other spondylosis with radiculopathy, cervical region: Secondary | ICD-10-CM | POA: Diagnosis not present

## 2020-03-20 DIAGNOSIS — M4802 Spinal stenosis, cervical region: Secondary | ICD-10-CM | POA: Diagnosis not present

## 2020-03-24 ENCOUNTER — Ambulatory Visit: Payer: Medicare HMO | Admitting: Physician Assistant

## 2020-04-01 ENCOUNTER — Other Ambulatory Visit: Payer: Self-pay | Admitting: Physician Assistant

## 2020-04-01 DIAGNOSIS — E1142 Type 2 diabetes mellitus with diabetic polyneuropathy: Secondary | ICD-10-CM

## 2020-04-02 DIAGNOSIS — S93324S Dislocation of tarsometatarsal joint of right foot, sequela: Secondary | ICD-10-CM | POA: Diagnosis not present

## 2020-04-02 DIAGNOSIS — Z981 Arthrodesis status: Secondary | ICD-10-CM | POA: Diagnosis not present

## 2020-04-02 DIAGNOSIS — R6889 Other general symptoms and signs: Secondary | ICD-10-CM | POA: Diagnosis not present

## 2020-04-02 DIAGNOSIS — M19079 Primary osteoarthritis, unspecified ankle and foot: Secondary | ICD-10-CM | POA: Diagnosis not present

## 2020-04-03 ENCOUNTER — Ambulatory Visit (INDEPENDENT_AMBULATORY_CARE_PROVIDER_SITE_OTHER): Payer: Medicare HMO | Admitting: Physician Assistant

## 2020-04-03 ENCOUNTER — Encounter: Payer: Self-pay | Admitting: Physician Assistant

## 2020-04-03 VITALS — BP 134/64 | HR 64 | Ht 63.0 in

## 2020-04-03 DIAGNOSIS — E1142 Type 2 diabetes mellitus with diabetic polyneuropathy: Secondary | ICD-10-CM

## 2020-04-03 DIAGNOSIS — E1159 Type 2 diabetes mellitus with other circulatory complications: Secondary | ICD-10-CM

## 2020-04-03 DIAGNOSIS — R6889 Other general symptoms and signs: Secondary | ICD-10-CM | POA: Diagnosis not present

## 2020-04-03 DIAGNOSIS — I10 Essential (primary) hypertension: Secondary | ICD-10-CM | POA: Diagnosis not present

## 2020-04-03 DIAGNOSIS — G894 Chronic pain syndrome: Secondary | ICD-10-CM

## 2020-04-03 DIAGNOSIS — Z794 Long term (current) use of insulin: Secondary | ICD-10-CM | POA: Diagnosis not present

## 2020-04-03 DIAGNOSIS — E782 Mixed hyperlipidemia: Secondary | ICD-10-CM

## 2020-04-03 DIAGNOSIS — I152 Hypertension secondary to endocrine disorders: Secondary | ICD-10-CM

## 2020-04-03 DIAGNOSIS — E1140 Type 2 diabetes mellitus with diabetic neuropathy, unspecified: Secondary | ICD-10-CM

## 2020-04-03 DIAGNOSIS — M816 Localized osteoporosis [Lequesne]: Secondary | ICD-10-CM

## 2020-04-03 LAB — POCT GLYCOSYLATED HEMOGLOBIN (HGB A1C): Hemoglobin A1C: 7.4 % — AB (ref 4.0–5.6)

## 2020-04-03 MED ORDER — TRULICITY 0.75 MG/0.5ML ~~LOC~~ SOAJ
0.7500 mg | SUBCUTANEOUS | 2 refills | Status: DC
Start: 2020-04-03 — End: 2020-06-04

## 2020-04-03 MED ORDER — HYDROCODONE-ACETAMINOPHEN 10-325 MG PO TABS: 1.0000 | ORAL_TABLET | Freq: Two times a day (BID) | ORAL | 0 refills | Status: DC | PRN

## 2020-04-03 MED ORDER — LAMOTRIGINE 25 MG PO TABS
25.0000 mg | ORAL_TABLET | Freq: Every day | ORAL | 2 refills | Status: DC
Start: 2020-04-03 — End: 2020-06-04

## 2020-04-03 NOTE — Progress Notes (Addendum)
Subjective:    Patient ID: [name] Rice, female    DOB: May 31, 1956, 64 y.o.   MRN: [id number]  HPI  [name] Rice is a 64 year old female with MPH of DM, diabetic neuropathy, HTN, OSA, chronic kidney disease, chronic pain, presenting for 3 month follow up on Diabetes.   Patient is recovering from surgery 8 weeks ago on the right foot for a Lisfranc fracture. Patient admits she has been checking her sugars intermittently at home. She states it has been difficult to do anything since having surgery due to being in a wheelchair. She states she has been taking her Insulin, Actos, and Victoza most days, but admits theres some days where she does not get out of bed and does not take any medications. A1C 7.4% today, it was 6.9% last visit. She denies open sores or wounds. She admits she has been sleeping more. Denies hypoglycemic episodes. She states her gastroparesis symptoms have been stable and she is aware of foods that make her symptoms worsen, including red meats and greasy foods.    She states her chronic pain has gotten worse since being in a wheelchair and having to use her arms to push the chair. She states that she had an epidural on 6/8 which helped some, but she states her pain has come back quickly. She states she has been using heating pads and taking her pain medications. She denies constipation.   Patient admits her mood has been up and down lately. She admits to periods of decreased need for sleep and increased energy. She states she will have 1-2 days where she wants to do many projects, followed by 1-2 days of decreased energy, depressed mood, and hypersomnia.    .. Patient Active Problem List   Diagnosis Date Noted  . Bilateral rotator cuff syndrome 01/24/2020  . DDD (degenerative disc disease), cervical 12/27/2019  . Trigger finger, right middle finger 12/24/2019  . Trigger index finger of right hand 12/24/2019  . Chronic kidney disease (CKD), stage III (moderate) 10/04/2019  .  Acute right ankle pain 09/25/2019  . OAB (overactive bladder) 09/25/2019  . Type 2 diabetes mellitus with diabetic neuropathy, with long-term current use of insulin ([place]) 06/20/2019  . Dizziness 06/20/2019  . Bilateral carpal tunnel syndrome 01/24/2019  . Pressure injury of right foot, unstageable ([place]) 09/25/2018  . Calcification and ossification of muscle 07/13/2018  . Drug-induced constipation 05/23/2018  . Restrictive lung disease 04/12/2018  . Mixed hyperlipidemia 04/12/2018  . Vitamin D insufficiency 04/12/2018  . Hordeolum externum of right lower eyelid 02/16/2018  . Hot flashes 02/16/2018  . Ear itching 01/17/2018  . No energy 01/17/2018  . Vasomotor rhinitis 11/28/2017  . Lisfranc's dislocation, right, initial encounter 10/06/2017  . Freiberg's disease, right 10/06/2017  . Neck pain 07/11/2017  . Dystonia 09/21/2016  . Acute reaction to stress 03/28/2016  . Post-menopausal 03/16/2016  . Intertrigo 03/15/2016  . Left knee pain 03/15/2016  . Morbid obesity due to excess calories ([place]) 02/16/2016  . Unstable balance 02/16/2016  . Chronic pain syndrome 12/15/2015  . Non-proliferative diabetic retinopathy, right eye ([place]) 11/23/2015  . Diarrhea 01/12/2015  . Ear pain 12/09/2014  . Gastroparesis due to secondary diabetes ([place]) 09/16/2014  . Diabetic peripheral neuropathy ([place]) 05/30/2014  . Diverticulitis 11/27/2013  . Hepatic steatosis 09/26/2013  . SOB (shortness of breath) on exertion 03/31/2013  . Intestinal bacterial overgrowth 09/20/2012  . Lipoma of abdominal wall 05/01/2012  . Iron deficiency anemia 03/29/2012  . History of allergic  rhinitis 09/07/2011  . Preventative health care 08/16/2011  . Insomnia 02/06/2011  . OSA on CPAP   . Depression with anxiety 01/28/2008  . Acute bilateral low back pain 11/27/2007  . Type II or unspecified type diabetes mellitus with neurological manifestations, not stated as uncontrolled(250.60) 08/08/2006  . DYSLIPIDEMIA  08/08/2006  . Hypertension associated with diabetes ([place]) 08/08/2006  . GERD 08/08/2006   .Marland Kitchen Current Outpatient Medications on File Prior to Visit  Medication Sig Dispense Refill  . Alcohol Swabs (B-D SINGLE USE SWABS REGULAR) PADS 1 Device by Does not apply route 4 (four) times daily as needed. 100 each 1  . AMBULATORY NON FORMULARY MEDICATION One pair of diabetic shoes. Dx. Diabetic neuropathy. 2 each 0  . AMBULATORY NON FORMULARY MEDICATION Lift Chair DX: E11.42, L89.890, [id number] 1 Device 0  . Blood Glucose Calibration (TRUE METRIX LEVEL 1) Low SOLN 1 each by In Vitro route daily as needed. 1 each 0  . blood glucose meter kit and supplies 1 each by Other route in the morning, at noon, in the evening, and at bedtime. [employer] True Meter Air Meter Use up to four times daily as directed. (FOR ICD-10 E10.9, E11.9). 1 each 0  . buPROPion (WELLBUTRIN XL) 150 MG 24 hr tablet TAKE 1 TABLET(150 MG) BY MOUTH DAILY 90 tablet 1  . DROPLET PEN NEEDLES 32G X 6 MM MISC USE THREE TIMES DAILY  WITH  NOVOLOG 300 each 1  . DULoxetine (CYMBALTA) 60 MG capsule Take 2 capsules (120 mg total) by mouth daily. 180 capsule 1  . famotidine (PEPCID) 20 MG tablet Take 1 tablet (20 mg total) by mouth 2 (two) times daily. 180 tablet 1  . fluticasone (FLONASE) 50 MCG/ACT nasal spray Place 2 sprays into both nostrils daily. 16 g 5  . furosemide (LASIX) 20 MG tablet Take 1 tablet (20 mg total) by mouth 2 (two) times daily. 180 tablet 1  . gabapentin (NEURONTIN) 800 MG tablet Take 1 tablet (800 mg total) by mouth 2 (two) times daily. 180 tablet 1  . hydrOXYzine (ATARAX/VISTARIL) 50 MG tablet Take 1 tablet (50 mg total) by mouth every 8 (eight) hours as needed. For itching. 270 tablet 1  . insulin degludec (TRESIBA FLEXTOUCH) 200 UNIT/ML FlexTouch Pen Inject 40 Units into the skin at bedtime. 18 pen 1  . levocetirizine (XYZAL) 5 MG tablet Take 1 tablet (5 mg total) by mouth every evening. 90 tablet 3  . meclizine (ANTIVERT) 25  MG tablet Take 1 tablet (25 mg total) by mouth 3 (three) times daily as needed for dizziness. 270 tablet 1  . metoCLOPramide (REGLAN) 10 MG tablet Take 0.5 tablets (5 mg total) by mouth 3 (three) times daily before meals. 136 tablet 1  . mirabegron ER (MYRBETRIQ) 25 MG TB24 tablet Take 1 tablet (25 mg total) by mouth daily. 90 tablet 1  . nystatin ointment (MYCOSTATIN) apply to affected area twice a day 60 g 1  . pioglitazone (ACTOS) 30 MG tablet TAKE 1 TABLET(30 MG) BY MOUTH DAILY 90 tablet 1  . pravastatin (PRAVACHOL) 10 MG tablet Take 1 tablet (10 mg total) by mouth daily. 90 tablet 1  . propranolol (INDERAL) 20 MG tablet TAKE 1/2 TABLET BY MOUTH TWICE DAILY. 90 tablet 1  . TRUE METRIX BLOOD GLUCOSE TEST test strip TEST BLOOD SUGAR  UP  TO FOUR TIMES DAILY 400 strip 1   No current facility-administered medications on file prior to visit.   .. Depression screen [facility] 2/9 04/03/2020 03/02/2020  09/23/2019 06/18/2019 02/11/2019  Decreased Interest 1 0 1 3 0  Down, Depressed, Hopeless 1 1 1 3  0  PHQ - 2 Score 2 1 2 6  0  Altered sleeping 3 - 1 3 2   Tired, decreased energy 2 - 3 3 2   Change in appetite 2 - 2 3 0  Feeling bad or failure about yourself  2 - 0 0 0  Trouble concentrating 1 - 0 1 0  Moving slowly or fidgety/restless 3 - 2 2 0  Suicidal thoughts 1 - 0 0 0  PHQ-9 Score 16 - 10 18 4   Difficult doing work/chores Very difficult - Very difficult Somewhat difficult Not difficult at all  Some recent data might be hidden   .Marland Kitchen GAD 7 : Generalized Anxiety Score 04/03/2020 09/23/2019 06/18/2019 02/11/2019  Nervous, Anxious, on Edge 1 1 1 1   Control/stop worrying 3 1 1  0  Worry too much - different things 3 1 2 2   Trouble relaxing 3 1 1  0  Restless 2 2 0 2  Easily annoyed or irritable 3 1 1  0  Afraid - awful might happen 1 0 0 0  Total GAD 7 Score 16 7 6 5   Anxiety Difficulty Very difficult Somewhat difficult Very difficult Not difficult at all      Review of Systems  Constitutional:  Negative.   HENT: Negative.   Eyes: Negative.   Respiratory: Negative.   Cardiovascular: Negative.   Gastrointestinal: Positive for constipation and diarrhea (due to gastroporesis ). Negative for nausea and vomiting.  Genitourinary: Negative.   Musculoskeletal: Positive for back pain, gait problem and myalgias.  Skin: Negative.   Neurological: Positive for weakness. Negative for dizziness, numbness and headaches.  Psychiatric/Behavioral: Negative.          Objective:   Physical Exam Constitutional:      Appearance: She is obese.  HENT:     Head: Normocephalic and atraumatic.  Eyes:     Extraocular Movements: Extraocular movements intact.     Pupils: Pupils are equal, round, and reactive to light.  Cardiovascular:     Rate and Rhythm: Normal rate and regular rhythm.     Pulses: Normal pulses.     Heart sounds: Normal heart sounds.  Pulmonary:     Effort: Pulmonary effort is normal.     Breath sounds: Normal breath sounds.  Skin:    General: Skin is warm and dry.  Neurological:     General: No focal deficit present.     Mental Status: She is alert and oriented to person, place, and time.       Assessment & Plan:   Type 2 diabetes mellitus with diabetic neuropathy - A1C 7.4% - Discussed continuing current medications - Insulin, Actos, Victoza. Patient is taking medications most days but not all. Discussed being consistent with taking medications daily and checking BG levels at home - stop victoza. Started trulcity weekly so that maybe she could remember once a week and be more compliant.   Diabetic neuropathy - continue Cymbalta   Depression, anxiety  - Patient endorsed rapid changing to mood and energy levels. Considering Bipolar II disorder.  - Will start patient on Lamictal 25MG to stabilize mood. Discussed side effect and call office with any concerns.  - continue Wellbutrin and Cymbalta.   Chronic pain syndrome - Continue Hydrocodone-acetaminophen (NORCO)  twice daily as needed - Patient has an appointment with Dr. Darene Lamer in one month. - Epidural on 6/8 was helpful initially, states relief has  worn off.   Gastroporesis  - Patient states symptoms have been stable.   - Continue Reglan    - A1C up from 3 months ago, was 6.9 now 7.4. Discussed being consistent taking medications daily and watching diet.  - BP controlled today at 134/64 - Patient experiencing increased pain due to pushing self in wheelchair. She has an appointment in 1 month with Dr. Darene Lamer.  - Needs eye exam-ordered.   Refilled medications.  Marland KitchenMarland KitchenPDMP reviewed during this encounter.  PDMP reviewed. Norco refilled. On pain contract.   - Vaccines UTD - Follow up in 3 months.   Marland KitchenVernetta Honey PA-C, have reviewed and agree with the above documentation in it's entirety.

## 2020-04-24 ENCOUNTER — Encounter: Payer: Self-pay | Admitting: Sports Medicine

## 2020-04-24 ENCOUNTER — Ambulatory Visit (INDEPENDENT_AMBULATORY_CARE_PROVIDER_SITE_OTHER): Payer: Medicare HMO | Admitting: Sports Medicine

## 2020-04-24 DIAGNOSIS — R6889 Other general symptoms and signs: Secondary | ICD-10-CM | POA: Diagnosis not present

## 2020-04-24 DIAGNOSIS — G5603 Carpal tunnel syndrome, bilateral upper limbs: Secondary | ICD-10-CM | POA: Diagnosis not present

## 2020-04-24 DIAGNOSIS — M5030 Other cervical disc degeneration, unspecified cervical region: Secondary | ICD-10-CM

## 2020-04-24 NOTE — Progress Notes (Signed)
    Procedures performed today:    None.  Independent interpretation of notes and tests performed by another provider:   Cervical spine MRI personally reviewed, she does have a C6-C7 disc fusion, and another extrusion at the upper thoracic spine.  Brief History, Exam, Impression, and Recommendations:    Bilateral carpal tunnel syndrome post carpal tunnel release on the left Myasia returns, she is a very pleasant 64 year old female, she has a history of carpal tunnel syndrome, left and right, post carpal tunnel release on the left. She is continuing to wake up with numbness and tingling into her hands and fingertips, worse at night. On exam she has a negative Tinel's sign, negative Phalen sign, she also has cervical degenerative processes, I am trying to determine if this paresthesias in her hand are coming from her neck or her wrist, she has had a cervical epidural already. Adding a nerve conduction/EMG, continue gabapentin, adding carpal tunnel rehab exercises and she will purchase some extension splints from [employer] and wear them every night. Return to see me in a month.  DDD (degenerative disc disease), cervical Samyra has done well after her cervical epidural with Dr. Francesco Runner, her neck and posterior shoulder pain has resolved.  Going down her into her hand and fingertips, I do suspect this is carpal tunnel syndrome, see above for details of the work-up.    ___________________________________________ Gwen Her. Dianah Field, M.D., ABFM., CAQSM. Primary Care and [place] Instructor of [place] of [facility]

## 2020-04-24 NOTE — Assessment & Plan Note (Signed)
[name] Rice has done well after her cervical epidural with Dr. Francesco Runner, her neck and posterior shoulder pain has resolved.  Going down her into her hand and fingertips, I do suspect this is carpal tunnel syndrome, see above for details of the work-up.

## 2020-04-24 NOTE — Assessment & Plan Note (Signed)
[name] Rice returns, she is a very pleasant 64 year old female, she has a history of carpal tunnel syndrome, left and right, post carpal tunnel release on the left. She is continuing to wake up with numbness and tingling into her hands and fingertips, worse at night. On exam she has a negative Tinel's sign, negative Phalen sign, she also has cervical degenerative processes, I am trying to determine if this paresthesias in her hand are coming from her neck or her wrist, she has had a cervical epidural already. Adding a nerve conduction/EMG, continue gabapentin, adding carpal tunnel rehab exercises and she will purchase some extension splints from [employer] and wear them every night. Return to see me in a month.

## 2020-04-29 ENCOUNTER — Other Ambulatory Visit: Payer: Medicare HMO

## 2020-04-30 DIAGNOSIS — S93324D Dislocation of tarsometatarsal joint of right foot, subsequent encounter: Secondary | ICD-10-CM | POA: Diagnosis not present

## 2020-04-30 DIAGNOSIS — Z969 Presence of functional implant, unspecified: Secondary | ICD-10-CM | POA: Diagnosis not present

## 2020-04-30 DIAGNOSIS — R6889 Other general symptoms and signs: Secondary | ICD-10-CM | POA: Diagnosis not present

## 2020-05-06 ENCOUNTER — Encounter: Payer: Self-pay | Admitting: Physician Assistant

## 2020-05-07 DIAGNOSIS — G4733 Obstructive sleep apnea (adult) (pediatric): Secondary | ICD-10-CM | POA: Diagnosis not present

## 2020-05-07 DIAGNOSIS — J449 Chronic obstructive pulmonary disease, unspecified: Secondary | ICD-10-CM | POA: Diagnosis not present

## 2020-05-08 DIAGNOSIS — M6788 Other specified disorders of synovium and tendon, other site: Secondary | ICD-10-CM | POA: Diagnosis not present

## 2020-05-15 ENCOUNTER — Other Ambulatory Visit: Payer: Self-pay | Admitting: Sports Medicine

## 2020-05-15 DIAGNOSIS — N3281 Overactive bladder: Secondary | ICD-10-CM

## 2020-05-15 MED ORDER — MIRABEGRON ER 25 MG PO TB24: 25.0000 mg | ORAL_TABLET | Freq: Every day | ORAL | 0 refills | Status: DC

## 2020-05-21 ENCOUNTER — Other Ambulatory Visit: Payer: Self-pay | Admitting: Physician Assistant

## 2020-05-21 DIAGNOSIS — E1142 Type 2 diabetes mellitus with diabetic polyneuropathy: Secondary | ICD-10-CM

## 2020-05-21 DIAGNOSIS — I10 Essential (primary) hypertension: Secondary | ICD-10-CM

## 2020-05-21 DIAGNOSIS — E118 Type 2 diabetes mellitus with unspecified complications: Secondary | ICD-10-CM

## 2020-05-21 DIAGNOSIS — Z794 Long term (current) use of insulin: Secondary | ICD-10-CM

## 2020-06-03 ENCOUNTER — Encounter: Payer: Self-pay | Admitting: Physician Assistant

## 2020-06-03 DIAGNOSIS — L304 Erythema intertrigo: Secondary | ICD-10-CM

## 2020-06-04 ENCOUNTER — Other Ambulatory Visit: Payer: Self-pay | Admitting: Neurology

## 2020-06-04 DIAGNOSIS — Z981 Arthrodesis status: Secondary | ICD-10-CM | POA: Diagnosis not present

## 2020-06-04 DIAGNOSIS — M7671 Peroneal tendinitis, right leg: Secondary | ICD-10-CM | POA: Diagnosis not present

## 2020-06-04 DIAGNOSIS — R6889 Other general symptoms and signs: Secondary | ICD-10-CM | POA: Diagnosis not present

## 2020-06-04 DIAGNOSIS — M722 Plantar fascial fibromatosis: Secondary | ICD-10-CM | POA: Diagnosis not present

## 2020-06-04 MED ORDER — NYSTATIN 100000 UNIT/GM EX OINT: TOPICAL_OINTMENT | CUTANEOUS | 1 refills | Status: DC

## 2020-06-04 MED ORDER — LAMOTRIGINE 25 MG PO TABS: 25.0000 mg | ORAL_TABLET | Freq: Every day | ORAL | 0 refills | Status: DC

## 2020-06-04 MED ORDER — TRULICITY 0.75 MG/0.5ML ~~LOC~~ SOAJ: 0.7500 mg | SUBCUTANEOUS | 0 refills | Status: DC

## 2020-06-05 ENCOUNTER — Other Ambulatory Visit: Payer: Self-pay | Admitting: Physician Assistant

## 2020-06-05 DIAGNOSIS — L304 Erythema intertrigo: Secondary | ICD-10-CM

## 2020-06-05 MED ORDER — NYSTATIN 100000 UNIT/GM EX OINT: TOPICAL_OINTMENT | CUTANEOUS | 1 refills | Status: DC

## 2020-06-13 ENCOUNTER — Encounter: Payer: Self-pay | Admitting: Physician Assistant

## 2020-06-20 ENCOUNTER — Other Ambulatory Visit: Payer: Self-pay | Admitting: Physician Assistant

## 2020-06-30 ENCOUNTER — Encounter: Payer: Self-pay | Admitting: Physician Assistant

## 2020-06-30 ENCOUNTER — Other Ambulatory Visit: Payer: Self-pay | Admitting: Physician Assistant

## 2020-06-30 ENCOUNTER — Ambulatory Visit (INDEPENDENT_AMBULATORY_CARE_PROVIDER_SITE_OTHER): Payer: Medicare HMO | Admitting: Physician Assistant

## 2020-06-30 VITALS — BP 142/55 | HR 67 | Ht 63.0 in | Wt 276.0 lb

## 2020-06-30 DIAGNOSIS — R0609 Other forms of dyspnea: Secondary | ICD-10-CM

## 2020-06-30 DIAGNOSIS — R5382 Chronic fatigue, unspecified: Secondary | ICD-10-CM

## 2020-06-30 DIAGNOSIS — Z794 Long term (current) use of insulin: Secondary | ICD-10-CM

## 2020-06-30 DIAGNOSIS — R202 Paresthesia of skin: Secondary | ICD-10-CM | POA: Insufficient documentation

## 2020-06-30 DIAGNOSIS — Z9989 Dependence on other enabling machines and devices: Secondary | ICD-10-CM

## 2020-06-30 DIAGNOSIS — E1142 Type 2 diabetes mellitus with diabetic polyneuropathy: Secondary | ICD-10-CM

## 2020-06-30 DIAGNOSIS — R6889 Other general symptoms and signs: Secondary | ICD-10-CM | POA: Diagnosis not present

## 2020-06-30 DIAGNOSIS — E1165 Type 2 diabetes mellitus with hyperglycemia: Secondary | ICD-10-CM | POA: Diagnosis not present

## 2020-06-30 DIAGNOSIS — E1159 Type 2 diabetes mellitus with other circulatory complications: Secondary | ICD-10-CM

## 2020-06-30 DIAGNOSIS — G4733 Obstructive sleep apnea (adult) (pediatric): Secondary | ICD-10-CM

## 2020-06-30 DIAGNOSIS — E1140 Type 2 diabetes mellitus with diabetic neuropathy, unspecified: Secondary | ICD-10-CM

## 2020-06-30 DIAGNOSIS — Z23 Encounter for immunization: Secondary | ICD-10-CM

## 2020-06-30 DIAGNOSIS — E559 Vitamin D deficiency, unspecified: Secondary | ICD-10-CM | POA: Diagnosis not present

## 2020-06-30 DIAGNOSIS — E785 Hyperlipidemia, unspecified: Secondary | ICD-10-CM

## 2020-06-30 DIAGNOSIS — F331 Major depressive disorder, recurrent, moderate: Secondary | ICD-10-CM

## 2020-06-30 DIAGNOSIS — R531 Weakness: Secondary | ICD-10-CM | POA: Insufficient documentation

## 2020-06-30 DIAGNOSIS — E1343 Other specified diabetes mellitus with diabetic autonomic (poly)neuropathy: Secondary | ICD-10-CM

## 2020-06-30 DIAGNOSIS — R0600 Dyspnea, unspecified: Secondary | ICD-10-CM

## 2020-06-30 DIAGNOSIS — D509 Iron deficiency anemia, unspecified: Secondary | ICD-10-CM | POA: Diagnosis not present

## 2020-06-30 DIAGNOSIS — G894 Chronic pain syndrome: Secondary | ICD-10-CM

## 2020-06-30 DIAGNOSIS — I10 Essential (primary) hypertension: Secondary | ICD-10-CM

## 2020-06-30 DIAGNOSIS — I152 Hypertension secondary to endocrine disorders: Secondary | ICD-10-CM

## 2020-06-30 LAB — POCT GLYCOSYLATED HEMOGLOBIN (HGB A1C): Hemoglobin A1C: 7.3 % — AB (ref 4.0–5.6)

## 2020-06-30 MED ORDER — LAMOTRIGINE 100 MG PO TABS: ORAL_TABLET | ORAL | 1 refills | Status: DC

## 2020-06-30 NOTE — Progress Notes (Addendum)
Subjective:    Patient ID: [name] Rice, female    DOB: Apr 11, 1956, 63 y.o.   MRN: [id number]  Diabetes Associated symptoms include fatigue. Pertinent negatives for diabetes include no chest pain.   Patient is a 64 year old morbidly obese female patient with a history of diabetes, hyperlipidemia, sleep apnea, chronic fatigue, OSA, MDD, diabetic neuropathy gastritis, hypercholesteremia, chronic pain, and iron deficiency anemia presenting for a three month follow-up to discuss her medication.  Patient describes her typical day stating that she struggles with fatigue sleeping for hours at a time only waking to use the bathroom. She states that she consumes one large meal a day because of how often she is sleeping. Patient affirms that she is still having pain in her right foot which prevents her from walking and experiences chaffing between her legs when she uses her legs to scoot in her wheel chair. She also states her shoulders are now painful from having to push herself along in her chair.  After asking clarifying questions states that she more stays in the bed throughout the day after getting about 10-12 hour of sleep. Lacks motivation to get up Medication use is as follows: Bupropion discontinued--> nightmares  trulicity- still having problems taking it regularly.Takes a few days late more than 7 days apart nearly every week.    Depression- more bad days than good. Trying to be more consistent with medication timing  but having a hard time remembering. She does take it daily States that she does not get much benefit from the Lamictal.   Denies taking Vit D.   Patient also stated concerns over family history of heart disease after  her niece had MI at age 2.-transthyretin amyloid cardiomyopathy--> wants to be tested for this. States that she "has all ten symptoms for it" according to a website she saw including arm pain, fatigue swelling of the leg. Informed her we could discuss reason for  symptom.  Brother age-68 MI and blood clots leading to death  01/26/2017 echo  Ef 29-47  Grade 1 diastolic dysfunction Mild calcification of mitral valve   [place] in --> apnea machine maintenance   Review of Systems  Constitutional: Positive for fatigue.  Cardiovascular: Negative for chest pain and palpitations.  Gastrointestinal: Negative for abdominal pain, blood in stool and diarrhea.  Musculoskeletal: Positive for arthralgias.       Objective:   Physical Exam Cardiovascular:     Rate and Rhythm: Normal rate and regular rhythm.     Heart sounds: Murmur heard.   Pulmonary:     Effort: Pulmonary effort is normal.     Breath sounds: Normal breath sounds.    .. Today's Vitals   06/30/20 1419  BP: (!) 142/55  Pulse: 67  SpO2: 99%  Weight: 276 lb (125.2 kg)  Height: 5\' 3"  (1.6 m)   Body mass index is 48.89 kg/m. Marland Kitchen       Assessment & Plan:  Marland KitchenMarland KitchenKyndall was seen today for diabetes.  Diagnoses and all orders for this visit:  Uncontrolled type 2 diabetes mellitus with hyperglycemia (HCC) -     POCT glycosylated hemoglobin (Hb A1C) -     Lipid Panel w/reflex Direct LDL  Flu vaccine need -     Flu Vaccine QUAD High Dose(Fluad)  Morbid obesity due to excess calories (HCC)  Chronic fatigue -     Cortisol, free, Serum -     VITAMIN D 25 Hydroxy (Vit-D Deficiency, Fractures) -  COMPLETE METABOLIC PANEL WITH GFR -     TSH -     ACTH  Weakness -     Cortisol, free, Serum -     VITAMIN D 25 Hydroxy (Vit-D Deficiency, Fractures) -     COMPLETE METABOLIC PANEL WITH GFR -     TSH -     ACTH  Paresthesia -     B12 -     TSH  Iron deficiency anemia, unspecified iron deficiency anemia type -     B12 -     CBC -     Fe+TIBC+Fer  Hyperlipidemia LDL goal <70 -     Lipid Panel w/reflex Direct LDL  Moderate episode of recurrent major depressive disorder (HCC) -     lamoTRIgine (LAMICTAL) 100 MG tablet; Take one half tablet for 1 week  then increase to 1 tablet daily.  Hypertension associated with diabetes ([place])  Gastroparesis due to secondary diabetes ([place])  Type 2 diabetes mellitus with diabetic neuropathy, with long-term current use of insulin (HCC)  Dyspnea on exertion -     ECHOCARDIOGRAM COMPLETE  OSA on CPAP  .Marland Kitchen Lab Results  Component Value Date   HGBA1C 7.3 (A) 06/30/2020    1) Diabetes mellitus, uncontrolled Pt is not far from goal.  Discussed the importance of adherence to medication schedule and proper nutrition. Talked about potentially involving family to help with reminders to take trulicity weekly, messages on her calendar, or an alarm on her phone.  2)MDD Increasing dosage of lamactal to 100mg  daily. Discontinue Wellbutrin   3)hypertension   4) hyperlipidemia Imaging- echocardiogram ordered to rule out patients concerns about cardiomyopathy and to compare against 2018 images.  Labs- CBC, CMP, TSH, AM serum Cortisol, A1C, b12, vitamin D, Lipid panel, Iron panel,   Encouraged patient to stay active and to make good choices concerning diet this includes multiple small meals daily rather than one large meal in PM. Talked to patient about cardiomyopathy and reassured her that she did not have signs of this in her 2018 echo(Eject. Fract. 58-83, Grade 1 diastolic dysfunction, Mild calcification of mitral valve). We will repeat to evaluate any changes of the heart.   She does not feel CPAP working right will help patient get someone to look at this.   Follow up in 3 months to discuss progress.  Marland KitchenVernetta Honey PA-C, have reviewed and agree with the above documentation in it's entirety.

## 2020-06-30 NOTE — Patient Instructions (Addendum)
Will see about check up for CPAP.  Will get labs.  Will order follow up echo.  Increase lamictal to 50mg  for 1 week then 100mg  daily.  Start taking medications regularly.    Diabetes Mellitus and Nutrition, Adult When you have diabetes (diabetes mellitus), it is very important to have healthy eating habits because your blood sugar (glucose) levels are greatly affected by what you eat and drink. Eating healthy foods in the appropriate amounts, at about the same times every day, can help you:  Control your blood glucose.  Lower your risk of heart disease.  Improve your blood pressure.  Reach or maintain a healthy weight. Every person with diabetes is different, and each person has different needs for a meal plan. Your health care provider may recommend that you work with a diet and nutrition specialist (dietitian) to make a meal plan that is best for you. Your meal plan may vary depending on factors such as:  The calories you need.  The medicines you take.  Your weight.  Your blood glucose, blood pressure, and cholesterol levels.  Your activity level.  Other health conditions you have, such as heart or kidney disease. How do carbohydrates affect me? Carbohydrates, also called carbs, affect your blood glucose level more than any other type of food. Eating carbs naturally raises the amount of glucose in your blood. Carb counting is a method for keeping track of how many carbs you eat. Counting carbs is important to keep your blood glucose at a healthy level, especially if you use insulin or take certain oral diabetes medicines. It is important to know how many carbs you can safely have in each meal. This is different for every person. Your dietitian can help you calculate how many carbs you should have at each meal and for each snack. Foods that contain carbs include:  Bread, cereal, rice, pasta, and crackers.  Potatoes and corn.  Peas, beans, and lentils.  Milk and  yogurt.  Fruit and juice.  Desserts, such as cakes, cookies, ice cream, and candy. How does alcohol affect me? Alcohol can cause a sudden decrease in blood glucose (hypoglycemia), especially if you use insulin or take certain oral diabetes medicines. Hypoglycemia can be a life-threatening condition. Symptoms of hypoglycemia (sleepiness, dizziness, and confusion) are similar to symptoms of having too much alcohol. If your health care provider says that alcohol is safe for you, follow these guidelines:  Limit alcohol intake to no more than 1 drink per day for nonpregnant women and 2 drinks per day for men. One drink equals 12 oz of beer, 5 oz of wine, or 1 oz of hard liquor.  Do not drink on an empty stomach.  Keep yourself hydrated with water, diet soda, or unsweetened iced tea.  Keep in mind that regular soda, juice, and other mixers may contain a lot of sugar and must be counted as carbs. What are tips for following this plan?  Reading food labels  Start by checking the serving size on the "Nutrition Facts" label of packaged foods and drinks. The amount of calories, carbs, fats, and other nutrients listed on the label is based on one serving of the item. Many items contain more than one serving per package.  Check the total grams (g) of carbs in one serving. You can calculate the number of servings of carbs in one serving by dividing the total carbs by 15. For example, if a food has 30 g of total carbs, it would be equal  to 2 servings of carbs.  Check the number of grams (g) of saturated and trans fats in one serving. Choose foods that have low or no amount of these fats.  Check the number of milligrams (mg) of salt (sodium) in one serving. Most people should limit total sodium intake to less than 2,300 mg per day.  Always check the nutrition information of foods labeled as "low-fat" or "nonfat". These foods may be higher in added sugar or refined carbs and should be avoided.  Talk to  your dietitian to identify your daily goals for nutrients listed on the label. Shopping  Avoid buying canned, premade, or processed foods. These foods tend to be high in fat, sodium, and added sugar.  Shop around the outside edge of the grocery store. This includes fresh fruits and vegetables, bulk grains, fresh meats, and fresh dairy. Cooking  Use low-heat cooking methods, such as baking, instead of high-heat cooking methods like deep frying.  Cook using healthy oils, such as olive, canola, or sunflower oil.  Avoid cooking with butter, cream, or high-fat meats. Meal planning  Eat meals and snacks regularly, preferably at the same times every day. Avoid going long periods of time without eating.  Eat foods high in fiber, such as fresh fruits, vegetables, beans, and whole grains. Talk to your dietitian about how many servings of carbs you can eat at each meal.  Eat 4-6 ounces (oz) of lean protein each day, such as lean meat, chicken, fish, eggs, or tofu. One oz of lean protein is equal to: ? 1 oz of meat, chicken, or fish. ? 1 egg. ?  cup of tofu.  Eat some foods each day that contain healthy fats, such as avocado, nuts, seeds, and fish. Lifestyle  Check your blood glucose regularly.  Exercise regularly as told by your health care provider. This may include: ? 150 minutes of moderate-intensity or vigorous-intensity exercise each week. This could be brisk walking, biking, or water aerobics. ? Stretching and doing strength exercises, such as yoga or weightlifting, at least 2 times a week.  Take medicines as told by your health care provider.  Do not use any products that contain nicotine or tobacco, such as cigarettes and e-cigarettes. If you need help quitting, ask your health care provider.  Work with a Social worker or diabetes educator to identify strategies to manage stress and any emotional and social challenges. Questions to ask a health care provider  Do I need to meet with  a diabetes educator?  Do I need to meet with a dietitian?  What number can I call if I have questions?  When are the best times to check my blood glucose? Where to find more information:  American Diabetes Association: diabetes.org  Academy of Nutrition and Dietetics: www.eatright.[employer] of Diabetes and Digestive and Kidney Diseases (NIH): [employer] Summary  A healthy meal plan will help you control your blood glucose and maintain a healthy lifestyle.  Working with a diet and nutrition specialist (dietitian) can help you make a meal plan that is best for you.  Keep in mind that carbohydrates (carbs) and alcohol have immediate effects on your blood glucose levels. It is important to count carbs and to use alcohol carefully. This information is not intended to replace advice given to you by your health care provider. Make sure you discuss any questions you have with your health care provider. Document Revised: 09/15/2017 Document Reviewed: 11/07/2016 Elsevier Patient Education  2020 [employer].

## 2020-07-01 DIAGNOSIS — Z794 Long term (current) use of insulin: Secondary | ICD-10-CM | POA: Diagnosis not present

## 2020-07-01 DIAGNOSIS — M7670 Peroneal tendinitis, unspecified leg: Secondary | ICD-10-CM | POA: Diagnosis not present

## 2020-07-01 DIAGNOSIS — R6889 Other general symptoms and signs: Secondary | ICD-10-CM | POA: Diagnosis not present

## 2020-07-01 DIAGNOSIS — M722 Plantar fascial fibromatosis: Secondary | ICD-10-CM | POA: Diagnosis not present

## 2020-07-01 DIAGNOSIS — S93324D Dislocation of tarsometatarsal joint of right foot, subsequent encounter: Secondary | ICD-10-CM | POA: Diagnosis not present

## 2020-07-01 DIAGNOSIS — Z79899 Other long term (current) drug therapy: Secondary | ICD-10-CM | POA: Diagnosis not present

## 2020-07-01 DIAGNOSIS — E119 Type 2 diabetes mellitus without complications: Secondary | ICD-10-CM | POA: Diagnosis not present

## 2020-07-01 MED ORDER — HYDROCODONE-ACETAMINOPHEN 10-325 MG PO TABS: 1.0000 | ORAL_TABLET | Freq: Two times a day (BID) | ORAL | 0 refills | Status: DC | PRN

## 2020-07-01 NOTE — Progress Notes (Signed)
[name] Rice,   Normal hemoglobin. Iron stores look good.  Kidney function has improved! Vitamin D VERY low. I will send over weekly vitamin D but you have to take it for it to get these levels up.  B12 is low. Start taking b12 1019mcg daily. Have you taken this orally in past?

## 2020-07-01 NOTE — Telephone Encounter (Signed)
Asking for Norco refill.  Last written 04/03/2020 #60 no refills Pended 3 prescriptions Patient seen yesterday

## 2020-07-02 DIAGNOSIS — R600 Localized edema: Secondary | ICD-10-CM | POA: Diagnosis not present

## 2020-07-02 DIAGNOSIS — M25471 Effusion, right ankle: Secondary | ICD-10-CM | POA: Diagnosis not present

## 2020-07-02 DIAGNOSIS — S86301A Unspecified injury of muscle(s) and tendon(s) of peroneal muscle group at lower leg level, right leg, initial encounter: Secondary | ICD-10-CM | POA: Diagnosis not present

## 2020-07-06 ENCOUNTER — Ambulatory Visit: Payer: Medicare HMO | Admitting: Physician Assistant

## 2020-07-06 ENCOUNTER — Encounter: Payer: Self-pay | Admitting: *Deleted

## 2020-07-07 ENCOUNTER — Ambulatory Visit: Payer: Medicare HMO | Admitting: Diagnostic Neuroimaging

## 2020-07-07 LAB — VITAMIN B12: Vitamin B-12: 309 pg/mL (ref 200–1100)

## 2020-07-07 LAB — COMPLETE METABOLIC PANEL WITH GFR
AG Ratio: 1.3 (calc) (ref 1.0–2.5)
ALT: 10 U/L (ref 6–29)
AST: 14 U/L (ref 10–35)
Albumin: 3.8 g/dL (ref 3.6–5.1)
Alkaline phosphatase (APISO): 102 U/L (ref 37–153)
BUN: 20 mg/dL (ref 7–25)
CO2: 26 mmol/L (ref 20–32)
Calcium: 8.9 mg/dL (ref 8.6–10.4)
Chloride: 102 mmol/L (ref 98–110)
Creat: 0.93 mg/dL (ref 0.50–0.99)
GFR, Est African American: 74 mL/min/{1.73_m2} (ref >=60)
GFR, Est Non African American: 64 mL/min/{1.73_m2} (ref >=60)
Globulin: 2.9 g/dL (calc) (ref 1.9–3.7)
Glucose, Bld: 129 mg/dL — ABNORMAL HIGH (ref 65–99)
Potassium: 4 mmol/L (ref 3.5–5.3)
Sodium: 137 mmol/L (ref 135–146)
Total Bilirubin: 0.6 mg/dL (ref 0.2–1.2)
Total Protein: 6.7 g/dL (ref 6.1–8.1)

## 2020-07-07 LAB — CBC
HCT: 42 % (ref 35.0–45.0)
Hemoglobin: 13.8 g/dL (ref 11.7–15.5)
MCH: 29.3 pg (ref 27.0–33.0)
MCHC: 32.9 g/dL (ref 32.0–36.0)
MCV: 89.2 fL (ref 80.0–100.0)
MPV: 10.4 fL (ref 7.5–12.5)
Platelets: 201 10*3/uL (ref 140–400)
RBC: 4.71 10*6/uL (ref 3.80–5.10)
RDW: 13.4 % (ref 11.0–15.0)
WBC: 7.2 10*3/uL (ref 3.8–10.8)

## 2020-07-07 LAB — IRON,TIBC AND FERRITIN PANEL
%SAT: 21 % (calc) (ref 16–45)
Ferritin: 47 ng/mL (ref 16–288)
Iron: 76 ug/dL (ref 45–160)
TIBC: 370 mcg/dL (calc) (ref 250–450)

## 2020-07-07 LAB — LIPID PANEL W/REFLEX DIRECT LDL
Cholesterol: 149 mg/dL (ref <200)
HDL: 52 mg/dL (ref >=50)
LDL Cholesterol (Calc): 79 mg/dL (calc)
Non-HDL Cholesterol (Calc): 97 mg/dL (calc) (ref <130)
Total CHOL/HDL Ratio: 2.9 (calc) (ref <5.0)
Triglycerides: 92 mg/dL (ref <150)

## 2020-07-07 LAB — TSH: TSH: 2.23 mIU/L (ref 0.40–4.50)

## 2020-07-07 LAB — ACTH: C206 ACTH: 7 pg/mL (ref 6–50)

## 2020-07-07 LAB — CORTISOL, FREE: Cortisol Free, Ser: 0.16 ug/dL

## 2020-07-07 LAB — VITAMIN D 25 HYDROXY (VIT D DEFICIENCY, FRACTURES): Vit D, 25-Hydroxy: 10 ng/mL — ABNORMAL LOW (ref 30–100)

## 2020-07-09 ENCOUNTER — Telehealth: Payer: Self-pay

## 2020-07-09 MED ORDER — DOXYCYCLINE HYCLATE 100 MG PO TABS: 100.0000 mg | ORAL_TABLET | Freq: Two times a day (BID) | ORAL | 0 refills | Status: DC

## 2020-07-09 MED ORDER — FLUCONAZOLE 150 MG PO TABS: ORAL_TABLET | ORAL | 0 refills | Status: DC

## 2020-07-09 NOTE — Telephone Encounter (Signed)
Ok sent diflucan to help with yeast and doxycycline to pharmacy.

## 2020-07-09 NOTE — Telephone Encounter (Signed)
Pt called stating that the fungal infection between her thighs and abdomen has gotten worse. Nystatin ointment has not helped to relieve her symptoms. Pt currently has the start of a wound on her abdomen. She is currently using powder and ice to reduce the itching and redness. Requesting for provider to send in a antibiotic rx. Pls advise, thanks.

## 2020-07-10 NOTE — Telephone Encounter (Signed)
LMOM letting patient know medications have been sent to the pharmacy and call with any questions.

## 2020-07-12 ENCOUNTER — Encounter: Payer: Self-pay | Admitting: Physician Assistant

## 2020-07-12 DIAGNOSIS — E1142 Type 2 diabetes mellitus with diabetic polyneuropathy: Secondary | ICD-10-CM

## 2020-07-12 DIAGNOSIS — E559 Vitamin D deficiency, unspecified: Secondary | ICD-10-CM

## 2020-07-13 MED ORDER — PRAVASTATIN SODIUM 10 MG PO TABS: 10.0000 mg | ORAL_TABLET | Freq: Every day | ORAL | 1 refills | Status: DC

## 2020-07-14 ENCOUNTER — Telehealth: Payer: Self-pay | Admitting: Physician Assistant

## 2020-07-14 DIAGNOSIS — G4733 Obstructive sleep apnea (adult) (pediatric): Secondary | ICD-10-CM

## 2020-07-14 DIAGNOSIS — F331 Major depressive disorder, recurrent, moderate: Secondary | ICD-10-CM | POA: Insufficient documentation

## 2020-07-14 DIAGNOSIS — Z9989 Dependence on other enabling machines and devices: Secondary | ICD-10-CM

## 2020-07-14 NOTE — Telephone Encounter (Signed)
It looks like her last CPAP sleep study was in 2014. They will need this updated. Will contact patient and let her know.

## 2020-07-14 NOTE — Telephone Encounter (Signed)
Pt needs some CPAP management. She does not feel it is working as well. Can we help her get this set up. I can't remember who she went through but been a while since anyone has come out.

## 2020-07-15 NOTE — Telephone Encounter (Signed)
Since sleep study over 5 years, she does need this repeated to evaluate settings. Order placed. They will contact patient. Needs split night study so can not do at home. Patient is concerned with ability to do sleep study because of issues with foot and in a wheelchair. Will see if they can accommodate. Order placed.

## 2020-07-15 NOTE — Telephone Encounter (Signed)
Left message on machine for patient to call back to discuss.  

## 2020-07-15 NOTE — Telephone Encounter (Signed)
Spoke with patient, she got through [facility], which is now [facility]. Will send new order. Patient aware last sleep study 2014 and may need another one.

## 2020-07-16 DIAGNOSIS — M19079 Primary osteoarthritis, unspecified ankle and foot: Secondary | ICD-10-CM | POA: Diagnosis not present

## 2020-07-16 DIAGNOSIS — M19071 Primary osteoarthritis, right ankle and foot: Secondary | ICD-10-CM | POA: Diagnosis not present

## 2020-07-16 DIAGNOSIS — M6788 Other specified disorders of synovium and tendon, other site: Secondary | ICD-10-CM | POA: Diagnosis not present

## 2020-07-20 ENCOUNTER — Ambulatory Visit (HOSPITAL_BASED_OUTPATIENT_CLINIC_OR_DEPARTMENT_OTHER)
Admission: RE | Admit: 2020-07-20 | Discharge: 2020-07-20 | Disposition: A | Discharge status: Home / Self Care | Payer: Medicare HMO | Source: Ambulatory Visit | Attending: Physician Assistant | Admitting: Physician Assistant

## 2020-07-20 ENCOUNTER — Other Ambulatory Visit: Payer: Self-pay

## 2020-07-20 DIAGNOSIS — R0600 Dyspnea, unspecified: Secondary | ICD-10-CM | POA: Diagnosis not present

## 2020-07-20 LAB — ECHOCARDIOGRAM COMPLETE
Area-P 1/2: 2.42 cm2
S' Lateral: 2.34 cm

## 2020-07-20 MED ORDER — VITAMIN D (ERGOCALCIFEROL) 1.25 MG (50000 UNIT) PO CAPS: 50000.0000 [IU] | ORAL_CAPSULE | ORAL | 3 refills | Status: DC

## 2020-07-20 NOTE — Addendum Note (Signed)
Addended by: Donella Stade on: 07/20/2020 12:46 PM   Modules accepted: Orders

## 2020-07-21 DIAGNOSIS — M19071 Primary osteoarthritis, right ankle and foot: Secondary | ICD-10-CM | POA: Diagnosis not present

## 2020-07-23 ENCOUNTER — Encounter: Payer: Self-pay | Admitting: Physician Assistant

## 2020-07-23 DIAGNOSIS — I517 Cardiomegaly: Secondary | ICD-10-CM | POA: Insufficient documentation

## 2020-07-23 NOTE — Progress Notes (Signed)
[name] Rice,   EF is great at 60 to 65 percent.  You do have muscle build up in left ventricle likely from history of high blood pressure. Controlling BP is the best way to treat this. Also ACE/ARBS/Calcium channel blockerscan help with this. I would like to start one. You have not tolerated ACE in past. Thoughts on starting cozaar?  No valvular disease.

## 2020-07-24 ENCOUNTER — Other Ambulatory Visit: Payer: Self-pay | Admitting: Physician Assistant

## 2020-07-24 MED ORDER — LOSARTAN POTASSIUM 25 MG PO TABS: 25.0000 mg | ORAL_TABLET | Freq: Every day | ORAL | 0 refills | Status: DC

## 2020-08-03 ENCOUNTER — Encounter: Payer: Self-pay | Admitting: Physician Assistant

## 2020-08-06 ENCOUNTER — Other Ambulatory Visit: Payer: Self-pay | Admitting: Physician Assistant

## 2020-08-19 ENCOUNTER — Other Ambulatory Visit: Payer: Self-pay | Admitting: Physician Assistant

## 2020-08-19 DIAGNOSIS — R0602 Shortness of breath: Secondary | ICD-10-CM

## 2020-09-03 DIAGNOSIS — M24573 Contracture, unspecified ankle: Secondary | ICD-10-CM | POA: Diagnosis not present

## 2020-09-03 DIAGNOSIS — M2061 Acquired deformities of toe(s), unspecified, right foot: Secondary | ICD-10-CM | POA: Diagnosis not present

## 2020-09-03 DIAGNOSIS — M19071 Primary osteoarthritis, right ankle and foot: Secondary | ICD-10-CM | POA: Diagnosis not present

## 2020-09-03 DIAGNOSIS — M24571 Contracture, right ankle: Secondary | ICD-10-CM | POA: Diagnosis not present

## 2020-09-03 DIAGNOSIS — M19079 Primary osteoarthritis, unspecified ankle and foot: Secondary | ICD-10-CM | POA: Diagnosis not present

## 2020-09-03 DIAGNOSIS — S86301A Unspecified injury of muscle(s) and tendon(s) of peroneal muscle group at lower leg level, right leg, initial encounter: Secondary | ICD-10-CM | POA: Diagnosis not present

## 2020-09-09 ENCOUNTER — Other Ambulatory Visit: Payer: Self-pay | Admitting: Physician Assistant

## 2020-09-09 DIAGNOSIS — F331 Major depressive disorder, recurrent, moderate: Secondary | ICD-10-CM

## 2020-09-09 DIAGNOSIS — E1140 Type 2 diabetes mellitus with diabetic neuropathy, unspecified: Secondary | ICD-10-CM

## 2020-09-09 DIAGNOSIS — Z794 Long term (current) use of insulin: Secondary | ICD-10-CM

## 2020-09-25 DIAGNOSIS — M659 Synovitis and tenosynovitis, unspecified: Secondary | ICD-10-CM | POA: Diagnosis not present

## 2020-09-25 DIAGNOSIS — M24571 Contracture, right ankle: Secondary | ICD-10-CM | POA: Diagnosis not present

## 2020-09-25 DIAGNOSIS — E1165 Type 2 diabetes mellitus with hyperglycemia: Secondary | ICD-10-CM | POA: Diagnosis not present

## 2020-09-25 DIAGNOSIS — E1142 Type 2 diabetes mellitus with diabetic polyneuropathy: Secondary | ICD-10-CM | POA: Diagnosis not present

## 2020-09-25 DIAGNOSIS — M216X1 Other acquired deformities of right foot: Secondary | ICD-10-CM | POA: Diagnosis not present

## 2020-09-25 DIAGNOSIS — M7741 Metatarsalgia, right foot: Secondary | ICD-10-CM | POA: Diagnosis not present

## 2020-09-25 DIAGNOSIS — M62461 Contracture of muscle, right lower leg: Secondary | ICD-10-CM | POA: Diagnosis not present

## 2020-09-25 DIAGNOSIS — M2061 Acquired deformities of toe(s), unspecified, right foot: Secondary | ICD-10-CM | POA: Diagnosis not present

## 2020-09-25 DIAGNOSIS — M2021 Hallux rigidus, right foot: Secondary | ICD-10-CM | POA: Diagnosis not present

## 2020-09-25 DIAGNOSIS — M19071 Primary osteoarthritis, right ankle and foot: Secondary | ICD-10-CM | POA: Diagnosis not present

## 2020-09-25 DIAGNOSIS — M7671 Peroneal tendinitis, right leg: Secondary | ICD-10-CM | POA: Diagnosis not present

## 2020-09-25 DIAGNOSIS — I10 Essential (primary) hypertension: Secondary | ICD-10-CM | POA: Diagnosis not present

## 2020-09-25 DIAGNOSIS — G4730 Sleep apnea, unspecified: Secondary | ICD-10-CM | POA: Diagnosis not present

## 2020-09-25 DIAGNOSIS — J449 Chronic obstructive pulmonary disease, unspecified: Secondary | ICD-10-CM | POA: Diagnosis not present

## 2020-09-29 ENCOUNTER — Ambulatory Visit: Payer: Medicare HMO | Admitting: Physician Assistant

## 2020-10-19 ENCOUNTER — Ambulatory Visit: Payer: Medicare HMO | Admitting: Physician Assistant

## 2020-10-19 ENCOUNTER — Ambulatory Visit: Payer: Medicare HMO | Admitting: Sports Medicine

## 2020-10-21 ENCOUNTER — Encounter: Payer: Self-pay | Admitting: Physician Assistant

## 2020-10-21 DIAGNOSIS — J301 Allergic rhinitis due to pollen: Secondary | ICD-10-CM

## 2020-10-21 DIAGNOSIS — G894 Chronic pain syndrome: Secondary | ICD-10-CM

## 2020-10-21 MED ORDER — LEVOCETIRIZINE DIHYDROCHLORIDE 5 MG PO TABS: 5.0000 mg | ORAL_TABLET | Freq: Every evening | ORAL | 3 refills | Status: DC

## 2020-10-21 MED ORDER — HYDROCODONE-ACETAMINOPHEN 10-325 MG PO TABS: 1.0000 | ORAL_TABLET | Freq: Two times a day (BID) | ORAL | 0 refills | Status: DC | PRN

## 2020-10-22 ENCOUNTER — Telehealth: Payer: Self-pay

## 2020-10-22 NOTE — Telephone Encounter (Signed)
[facility] called stating pt not interested they tried reaching out and calling her she not returning they calls. Scott the person that called  stated  They would have to put her in for non admit for therapy he said they will be more that happy to work with her if you need the service you can resend in another referral for her.

## 2020-10-23 NOTE — Telephone Encounter (Signed)
Called pt she stated she not interested.

## 2020-10-23 NOTE — Telephone Encounter (Signed)
Reach out to patient. Is she interested in this service?

## 2020-10-26 ENCOUNTER — Ambulatory Visit: Payer: Medicare HMO | Admitting: Sports Medicine

## 2020-10-26 ENCOUNTER — Ambulatory Visit: Payer: Medicare HMO | Admitting: Physician Assistant

## 2020-10-29 ENCOUNTER — Other Ambulatory Visit: Payer: Self-pay | Admitting: Physician Assistant

## 2020-10-29 DIAGNOSIS — Z794 Long term (current) use of insulin: Secondary | ICD-10-CM

## 2020-10-29 DIAGNOSIS — E1142 Type 2 diabetes mellitus with diabetic polyneuropathy: Secondary | ICD-10-CM

## 2020-10-29 DIAGNOSIS — E118 Type 2 diabetes mellitus with unspecified complications: Secondary | ICD-10-CM

## 2020-10-29 DIAGNOSIS — I10 Essential (primary) hypertension: Secondary | ICD-10-CM

## 2020-11-03 ENCOUNTER — Ambulatory Visit: Payer: Medicare HMO | Admitting: Sports Medicine

## 2020-11-03 ENCOUNTER — Ambulatory Visit (INDEPENDENT_AMBULATORY_CARE_PROVIDER_SITE_OTHER): Payer: Medicare HMO | Admitting: Physician Assistant

## 2020-11-03 DIAGNOSIS — Z5329 Procedure and treatment not carried out because of patient's decision for other reasons: Secondary | ICD-10-CM

## 2020-11-03 NOTE — Progress Notes (Signed)
No show. Bad weather. No charge.

## 2020-11-11 DIAGNOSIS — G4733 Obstructive sleep apnea (adult) (pediatric): Secondary | ICD-10-CM | POA: Diagnosis not present

## 2020-11-11 DIAGNOSIS — J449 Chronic obstructive pulmonary disease, unspecified: Secondary | ICD-10-CM | POA: Diagnosis not present

## 2020-12-04 DIAGNOSIS — S86301A Unspecified injury of muscle(s) and tendon(s) of peroneal muscle group at lower leg level, right leg, initial encounter: Secondary | ICD-10-CM | POA: Diagnosis not present

## 2020-12-04 DIAGNOSIS — R6889 Other general symptoms and signs: Secondary | ICD-10-CM | POA: Diagnosis not present

## 2020-12-07 ENCOUNTER — Other Ambulatory Visit: Payer: Self-pay | Admitting: Physician Assistant

## 2020-12-07 DIAGNOSIS — E118 Type 2 diabetes mellitus with unspecified complications: Secondary | ICD-10-CM

## 2020-12-07 DIAGNOSIS — N3281 Overactive bladder: Secondary | ICD-10-CM

## 2020-12-07 DIAGNOSIS — Z794 Long term (current) use of insulin: Secondary | ICD-10-CM

## 2020-12-07 DIAGNOSIS — I10 Essential (primary) hypertension: Secondary | ICD-10-CM

## 2020-12-07 DIAGNOSIS — G894 Chronic pain syndrome: Secondary | ICD-10-CM

## 2020-12-07 DIAGNOSIS — E1142 Type 2 diabetes mellitus with diabetic polyneuropathy: Secondary | ICD-10-CM

## 2020-12-11 ENCOUNTER — Ambulatory Visit (INDEPENDENT_AMBULATORY_CARE_PROVIDER_SITE_OTHER): Payer: Medicare HMO | Admitting: Physician Assistant

## 2020-12-11 ENCOUNTER — Ambulatory Visit (INDEPENDENT_AMBULATORY_CARE_PROVIDER_SITE_OTHER): Payer: Medicare HMO

## 2020-12-11 ENCOUNTER — Encounter: Payer: Self-pay | Admitting: Physician Assistant

## 2020-12-11 ENCOUNTER — Other Ambulatory Visit: Payer: Self-pay

## 2020-12-11 ENCOUNTER — Ambulatory Visit (INDEPENDENT_AMBULATORY_CARE_PROVIDER_SITE_OTHER): Payer: Medicare HMO | Admitting: Sports Medicine

## 2020-12-11 VITALS — BP 150/56 | HR 70 | Ht 63.0 in

## 2020-12-11 DIAGNOSIS — E1343 Other specified diabetes mellitus with diabetic autonomic (poly)neuropathy: Secondary | ICD-10-CM | POA: Diagnosis not present

## 2020-12-11 DIAGNOSIS — G894 Chronic pain syndrome: Secondary | ICD-10-CM

## 2020-12-11 DIAGNOSIS — K219 Gastro-esophageal reflux disease without esophagitis: Secondary | ICD-10-CM

## 2020-12-11 DIAGNOSIS — I152 Hypertension secondary to endocrine disorders: Secondary | ICD-10-CM

## 2020-12-11 DIAGNOSIS — E1140 Type 2 diabetes mellitus with diabetic neuropathy, unspecified: Secondary | ICD-10-CM

## 2020-12-11 DIAGNOSIS — E1142 Type 2 diabetes mellitus with diabetic polyneuropathy: Secondary | ICD-10-CM | POA: Diagnosis not present

## 2020-12-11 DIAGNOSIS — I10 Essential (primary) hypertension: Secondary | ICD-10-CM | POA: Diagnosis not present

## 2020-12-11 DIAGNOSIS — N3281 Overactive bladder: Secondary | ICD-10-CM | POA: Diagnosis not present

## 2020-12-11 DIAGNOSIS — Z79899 Other long term (current) drug therapy: Secondary | ICD-10-CM | POA: Diagnosis not present

## 2020-12-11 DIAGNOSIS — E1159 Type 2 diabetes mellitus with other circulatory complications: Secondary | ICD-10-CM

## 2020-12-11 DIAGNOSIS — F331 Major depressive disorder, recurrent, moderate: Secondary | ICD-10-CM

## 2020-12-11 DIAGNOSIS — J301 Allergic rhinitis due to pollen: Secondary | ICD-10-CM

## 2020-12-11 DIAGNOSIS — M65321 Trigger finger, right index finger: Secondary | ICD-10-CM | POA: Diagnosis not present

## 2020-12-11 DIAGNOSIS — E118 Type 2 diabetes mellitus with unspecified complications: Secondary | ICD-10-CM

## 2020-12-11 DIAGNOSIS — Z794 Long term (current) use of insulin: Secondary | ICD-10-CM

## 2020-12-11 DIAGNOSIS — R0602 Shortness of breath: Secondary | ICD-10-CM

## 2020-12-11 DIAGNOSIS — J0100 Acute maxillary sinusitis, unspecified: Secondary | ICD-10-CM

## 2020-12-11 LAB — POCT GLYCOSYLATED HEMOGLOBIN (HGB A1C): Hemoglobin A1C: 7.6 % — AB (ref 4.0–5.6)

## 2020-12-11 MED ORDER — PRAVASTATIN SODIUM 10 MG PO TABS: 10.0000 mg | ORAL_TABLET | Freq: Every day | ORAL | 1 refills | Status: DC

## 2020-12-11 MED ORDER — OZEMPIC (0.25 OR 0.5 MG/DOSE) 2 MG/1.5ML ~~LOC~~ SOPN
0.2500 mg | PEN_INJECTOR | SUBCUTANEOUS | 2 refills | Status: DC
Start: 2020-12-11 — End: 2021-02-05

## 2020-12-11 MED ORDER — AMOXICILLIN-POT CLAVULANATE 875-125 MG PO TABS
1.0000 | ORAL_TABLET | Freq: Two times a day (BID) | ORAL | 0 refills | Status: DC
Start: 2020-12-11 — End: 2021-03-22

## 2020-12-11 MED ORDER — FUROSEMIDE 20 MG PO TABS
ORAL_TABLET | ORAL | 1 refills | Status: DC
Start: 2020-12-11 — End: 2021-06-16

## 2020-12-11 MED ORDER — LOSARTAN POTASSIUM 25 MG PO TABS
25.0000 mg | ORAL_TABLET | Freq: Every day | ORAL | 1 refills | Status: DC
Start: 2020-12-11 — End: 2021-06-08

## 2020-12-11 MED ORDER — LEVOCETIRIZINE DIHYDROCHLORIDE 5 MG PO TABS
5.0000 mg | ORAL_TABLET | Freq: Every evening | ORAL | 3 refills | Status: DC
Start: 2020-12-11 — End: 2021-12-27

## 2020-12-11 MED ORDER — FAMOTIDINE 20 MG PO TABS
20.0000 mg | ORAL_TABLET | Freq: Two times a day (BID) | ORAL | 1 refills | Status: DC
Start: 2020-12-11 — End: 2021-06-16

## 2020-12-11 MED ORDER — DULOXETINE HCL 60 MG PO CPEP: 120.0000 mg | ORAL_CAPSULE | Freq: Every day | ORAL | 3 refills | Status: DC

## 2020-12-11 MED ORDER — LAMOTRIGINE 100 MG PO TABS
100.0000 mg | ORAL_TABLET | Freq: Every day | ORAL | 0 refills | Status: DC
Start: 2020-12-11 — End: 2021-03-12

## 2020-12-11 MED ORDER — MIRABEGRON ER 25 MG PO TB24
25.0000 mg | ORAL_TABLET | Freq: Every day | ORAL | 0 refills | Status: DC
Start: 2020-12-11 — End: 2021-03-12

## 2020-12-11 MED ORDER — PIOGLITAZONE HCL 30 MG PO TABS: ORAL_TABLET | ORAL | 0 refills | Status: DC

## 2020-12-11 MED ORDER — HYDROCODONE-ACETAMINOPHEN 10-325 MG PO TABS: 1.0000 | ORAL_TABLET | Freq: Three times a day (TID) | ORAL | 0 refills | Status: DC | PRN

## 2020-12-11 MED ORDER — METOCLOPRAMIDE HCL 10 MG PO TABS: 5.0000 mg | ORAL_TABLET | Freq: Three times a day (TID) | ORAL | 1 refills | Status: DC

## 2020-12-11 MED ORDER — PROPRANOLOL HCL 20 MG PO TABS: ORAL_TABLET | ORAL | 0 refills | Status: DC

## 2020-12-11 MED ORDER — GABAPENTIN 800 MG PO TABS: 800.0000 mg | ORAL_TABLET | Freq: Three times a day (TID) | ORAL | 1 refills | Status: DC

## 2020-12-11 NOTE — Assessment & Plan Note (Signed)
This is a very pleasant 65 year old female, she has known trigger finger, this was last injected approximately a year ago, now with recurrence, reinjected today, trigger finger rehabilitation exercises given, return to see me on an as-needed basis.

## 2020-12-11 NOTE — Patient Instructions (Signed)
Start ozempic weekly.  augmentin for sinus infection.  Increase cymbalta to 2 tablets daily.  Increase gabapentin to three times a day.  norco as needed.

## 2020-12-11 NOTE — Progress Notes (Signed)
Subjective:    Patient ID: [name] Rice, female    DOB: 1956-09-08, 65 y.o.   MRN: [id number]  HPI  Pt is a 65 yo obese female with uncontrolled T2DM, HTN, OSA, gastroparesis, GERD, neuropathy, CKD III,  Chronic fatigue, chronic pain, MDD who presents to the clinic for medication refills.   DM- pt does not check her sugars regularly.  She does have some hypoglycemic feelings when she has to eat at least once a week. Not taking trulicity because creating knots under skin. She is taking tresbia most nights.   Pt is havin a lot of sinus congestion, pressure, headache. Hx of recurrent sinusitis. No fever, chills, body aches. Taking OtC cold meds for last 2 weeks. She will get better and then get worse.   Chronic pain- norco 2-3 times a day. Foot more painful that back at this point. Continues to see ortho.    .. Active Ambulatory Problems    Diagnosis Date Noted  . Type II or unspecified type diabetes mellitus with neurological manifestations, not stated as uncontrolled(250.60) 08/08/2006  . Hyperlipidemia LDL goal <70 08/08/2006  . Depression with anxiety 01/28/2008  . Hypertension associated with diabetes ([place]) 08/08/2006  . GERD 08/08/2006  . Acute bilateral low back pain 11/27/2007  . OSA on CPAP   . Insomnia 02/06/2011  . Preventative health care 08/16/2011  . History of allergic rhinitis 09/07/2011  . Iron deficiency anemia 03/29/2012  . Lipoma of abdominal wall 05/01/2012  . Intestinal bacterial overgrowth 09/20/2012  . SOB (shortness of breath) on exertion 03/31/2013  . Hepatic steatosis 09/26/2013  . Diverticulitis 11/27/2013  . Diabetic peripheral neuropathy ([place]) 05/30/2014  . Gastroparesis due to secondary diabetes ([place]) 09/16/2014  . Ear pain 12/09/2014  . Type 2 diabetes, controlled, with peripheral neuropathy ([place]) 12/09/2014  . Diarrhea 01/12/2015  . Non-proliferative diabetic retinopathy, right eye ([place]) 11/23/2015  . Chronic pain syndrome 12/15/2015  . Morbid  obesity due to excess calories ([place]) 02/16/2016  . Unstable balance 02/16/2016  . Intertrigo 03/15/2016  . Left knee pain 03/15/2016  . Post-menopausal 03/16/2016  . Acute reaction to stress 03/28/2016  . Dystonia 09/21/2016  . Neck pain 07/11/2017  . Lisfranc's dislocation, right, initial encounter 10/06/2017  . Freiberg's disease, right 10/06/2017  . Vasomotor rhinitis 11/28/2017  . Ear itching 01/17/2018  . No energy 01/17/2018  . Hordeolum externum of right lower eyelid 02/16/2018  . Hot flashes 02/16/2018  . Restrictive lung disease 04/12/2018  . Mixed hyperlipidemia 04/12/2018  . Vitamin D insufficiency 04/12/2018  . Drug-induced constipation 05/23/2018  . Calcification and ossification of muscle 07/13/2018  . Pressure injury of right foot, unstageable ([place]) 09/25/2018  . Bilateral carpal tunnel syndrome post carpal tunnel release on the left 01/24/2019  . Type 2 diabetes mellitus with diabetic neuropathy, with long-term current use of insulin ([place]) 06/20/2019  . Dizziness 06/20/2019  . Acute right ankle pain 09/25/2019  . OAB (overactive bladder) 09/25/2019  . Chronic kidney disease (CKD), stage III (moderate) ([place]) 10/04/2019  . Trigger finger, right middle finger 12/24/2019  . Trigger index finger of right hand 12/24/2019  . DDD (degenerative disc disease), cervical 12/27/2019  . Bilateral rotator cuff syndrome 01/24/2020  . Paresthesia 06/30/2020  . Weakness 06/30/2020  . Chronic fatigue 06/30/2020  . Moderate episode of recurrent major depressive disorder ([place]) 07/14/2020  . Left ventricular hypertrophy 07/23/2020   Resolved Ambulatory Problems    Diagnosis Date Noted  . IRON DEFICIENCY 01/30/2007  . DEPRESSION 08/08/2006  .  TORTICOLLIS, SPASMODIC 02/13/2007  . SINUSITIS, ACUTE 05/27/2009  . ALLERGIC RHINITIS 12/25/2006  . ASTHMA 01/28/2008  . COPD 08/08/2006  . DIVERTICULOSIS, COLON 02/12/2003  . DERMATITIS, SEBORRHEIC 01/30/2007  . BPPV (benign paroxysmal  positional vertigo), unspecified laterality 07/14/2009  . GOUT, HX OF 08/08/2006  . PERSONAL HX COLONIC POLYPS 09/16/2008  . NEPHROLITHIASIS, HX OF 01/28/2008  . Calf pain 11/29/2010  . Tremor, essential 02/06/2011  . Seborrheic dermatitis, unspecified 04/06/2011  . Finger pain 04/06/2011  . Shoulder pain, right 08/16/2011  . Left leg pain 04/10/2012  . Right ear pain 05/01/2012  . Seborrheic dermatitis 05/01/2012  . Hypoglycemia 05/01/2012  . Vaginal candidiasis 08/22/2012  . Sigmoid diverticulitis 09/15/2012  . Acute-on-chronic kidney disease Stage II 09/15/2012  . Cyst, breast 03/01/2013  . Lactic acidosis 03/31/2013  . Acute renal insufficiency 03/31/2013  . Abdominal pain, left lower quadrant 04/29/2013  . Gout 05/01/2013  . COPD (chronic obstructive pulmonary disease) with chronic bronchitis ([place]) 12/09/2014  . Right shoulder pain 02/16/2016  . Fall 03/16/2016  . Foot fracture, right, closed, initial encounter 08/23/2017  . Chronic pain of both shoulders 12/24/2019   Past Medical History:  Diagnosis Date  . Anxiety   . Arthritis   . Asthma   . Carpal tunnel syndrome, bilateral   . Cervical dystonia   . COPD (chronic obstructive pulmonary disease) ([place])   . Depression   . Diabetes mellitus   . Diverticulosis   . Gastroparesis   . GERD (gastroesophageal reflux disease)   . History of colonic polyps   . History of kidney stones   . History of seborrheic dermatitis   . HLD (hyperlipidemia)   . Hypertension   . Intussusception ([place])   . Lisfranc's sprain, left, initial encounter 10/06/2017  . Lumbar pain 2011  . Morbid obesity ([place])   . Nephrolithiasis   . Pneumonia   . Recurrent boils   . Sleep apnea   . Torn rotator cuff RT SHOULDER  . Tremor CCENTRAL NERVOUS SYSTEM     Review of Systems See HPI.     Objective:   Physical Exam Vitals reviewed.  Constitutional:      Appearance: She is obese.     Comments: Non ambulatory in wheelchair  HENT:      Head: Normocephalic.     Comments: Tenderness to palpation over maxillary sinuses.     Right Ear: Tympanic membrane, ear canal and external ear normal. There is no impacted cerumen.     Left Ear: Tympanic membrane and external ear normal. There is no impacted cerumen.     Nose: Congestion present.     Mouth/Throat:     Mouth: Mucous membranes are moist.  Eyes:     Conjunctiva/sclera: Conjunctivae normal.  Neck:     Vascular: No carotid bruit.  Cardiovascular:     Rate and Rhythm: Normal rate and regular rhythm.     Pulses: Normal pulses.  Pulmonary:     Effort: Pulmonary effort is normal.     Breath sounds: Normal breath sounds.  Neurological:     General: No focal deficit present.     Mental Status: She is alert and oriented to person, place, and time.  Psychiatric:        Mood and Affect: Mood normal.     .. Results for orders placed or performed in visit on 12/11/20  DRUG MONITORING, PANEL 6 WITH CONFIRMATION, URINE  Result Value Ref Range   Alcohol Metabolites NEGATIVE ng/mL   Amphetamines NEGATIVE <  500 ng/mL   Barbiturates NEGATIVE <300 ng/mL   Benzodiazepines NEGATIVE <100 ng/mL   Cocaine Metabolite NEGATIVE <150 ng/mL   6 Acetylmorphine NEGATIVE <10 ng/mL   Marijuana Metabolite NEGATIVE <20 ng/mL   Methadone Metabolite NEGATIVE <100 ng/mL   Opiates POSITIVE (A) <100 ng/mL   Codeine NEGATIVE <50 ng/mL   Hydrocodone 112 (H) <50 ng/mL   Hydromorphone 119 (H) <50 ng/mL   Morphine NEGATIVE <50 ng/mL   Norhydrocodone 370 (H) <50 ng/mL   Opiates Comments     Oxycodone NEGATIVE <100 ng/mL   Phencyclidine NEGATIVE <25 ng/mL   Creatinine 240.0 mg/dL   pH 5.3 4.5 - 9.0   Oxidant NEGATIVE mcg/mL  DM TEMPLATE  Result Value Ref Range   Notes and Comments    POCT glycosylated hemoglobin (Hb A1C)  Result Value Ref Range   Hemoglobin A1C 7.6 (A) 4.0 - 5.6 %   HbA1c POC (<> result, manual entry)     HbA1c, POC (prediabetic range)     HbA1c, POC (controlled diabetic  range)     *Note: Due to a large number of results and/or encounters for the requested time period, some results have not been displayed. A complete set of results can be found in Results Review.         Assessment & Plan:  Marland KitchenMarland KitchenReonna was seen today for diabetes.  Diagnoses and all orders for this visit:  Type 2 diabetes, controlled, with peripheral neuropathy (HCC) -     POCT glycosylated hemoglobin (Hb A1C) -     COMPLETE METABOLIC PANEL WITH GFR -     pravastatin (PRAVACHOL) 10 MG tablet; Take 1 tablet (10 mg total) by mouth daily. -     Semaglutide,0.25 or 0.5MG /DOS, (OZEMPIC, 0.25 OR 0.5 MG/DOSE,) 2 MG/1.5ML SOPN; Inject 0.25 mg into the skin once a week. -     losartan (COZAAR) 25 MG tablet; Take 1 tablet (25 mg total) by mouth daily.  Hypertension associated with diabetes ([place]) -     COMPLETE METABOLIC PANEL WITH GFR -     losartan (COZAAR) 25 MG tablet; Take 1 tablet (25 mg total) by mouth daily.  Diabetic peripheral neuropathy (HCC) -     DULoxetine (CYMBALTA) 60 MG capsule; Take 2 capsules (120 mg total) by mouth daily. -     pioglitazone (ACTOS) 30 MG tablet; TAKE 1 TABLET EVERY DAY/appt for refills  Type 2 diabetes mellitus with complication, with long-term current use of insulin (HCC) -     pioglitazone (ACTOS) 30 MG tablet; TAKE 1 TABLET EVERY DAY/appt for refills  Essential hypertension -     propranolol (INDERAL) 20 MG tablet; TAKE 1/2 TABLET BY MOUTH TWICE DAILY/appt for refills  Gastroesophageal reflux disease, unspecified whether esophagitis present -     famotidine (PEPCID) 20 MG tablet; Take 1 tablet (20 mg total) by mouth 2 (two) times daily.  Gastroparesis due to secondary diabetes (HCC) -     metoCLOPramide (REGLAN) 10 MG tablet; Take 0.5 tablets (5 mg total) by mouth 3 (three) times daily before meals.  Moderate episode of recurrent major depressive disorder (HCC) -     lamoTRIgine (LAMICTAL) 100 MG tablet; Take 1 tablet (100 mg total) by mouth  daily.  OAB (overactive bladder) -     mirabegron ER (MYRBETRIQ) 25 MG TB24 tablet; Take 1 tablet (25 mg total) by mouth daily.  Seasonal allergic rhinitis due to pollen -     levocetirizine (XYZAL) 5 MG tablet; Take 1 tablet (5 mg total)  by mouth every evening.  SOB (shortness of breath) on exertion -     furosemide (LASIX) 20 MG tablet; TAKE 1 TABLET(20 MG) BY MOUTH TWICE DAILY  Type 2 diabetes mellitus with diabetic neuropathy, with long-term current use of insulin (HCC) -     gabapentin (NEURONTIN) 800 MG tablet; Take 1 tablet (800 mg total) by mouth 3 (three) times daily.  Chronic pain syndrome -     HYDROcodone-acetaminophen (NORCO) 10-325 MG tablet; Take 1 tablet by mouth every 8 (eight) hours as needed. For moderate to severe pain. -     DRUG MONITORING, PANEL 6 WITH CONFIRMATION, URINE -     DM TEMPLATE  Medication management -     DRUG MONITORING, PANEL 6 WITH CONFIRMATION, URINE  Acute non-recurrent maxillary sinusitis -     amoxicillin-clavulanate (AUGMENTIN) 875-125 MG tablet; Take 1 tablet by mouth 2 (two) times daily.   ..PDMP reviewed during this encounter. norco refilled.  UDS ordered.   A1C not to goal.  Pt is not taking medication as directed.  Start ozempic weekly.  Continue actos/tresbia/ On ACE. BP not to goal.  On statin.  Needs DM eye exam.  Covid/flu/pneumonia UTD.   For pain increase gabapentin and cymbalta.   augmentin sent for sinusitis.   Follow up in 3 months.

## 2020-12-11 NOTE — Progress Notes (Signed)
    Procedures performed today:    Procedure: Real-time Ultrasound Guided injection of the right second flexor tendon sheath Device: Samsung [id number]  Verbal informed consent obtained.  Time-out conducted.  Noted no overlying erythema, induration, or other signs of local infection.  Skin prepped in a sterile fashion.  Local anesthesia: Topical Ethyl chloride.  With sterile technique and under real time ultrasound guidance:  Noted flexor tendon nodule, needle advanced between the flexor digitorum profundus and superficialis, 0.5 cc lidocaine, 0.5 cc kenalog 40 injected easily.   Completed without difficulty  Advised to call if fevers/chills, erythema, induration, drainage, or persistent bleeding.  Images permanently stored and available for review in PACS.  Impression: Technically successful ultrasound guided injection.  Independent interpretation of notes and tests performed by another provider:   None.  Brief History, Exam, Impression, and Recommendations:    Trigger index finger of right hand This is a very pleasant 65 year old female, she has known trigger finger, this was last injected approximately a year ago, now with recurrence, reinjected today, trigger finger rehabilitation exercises given, return to see me on an as-needed basis.    ___________________________________________ Gwen Her. Dianah Field, M.D., ABFM., CAQSM. Primary Care and [place] [employer] of [place] [employer] [facility]

## 2020-12-13 LAB — DRUG MONITORING, PANEL 6 WITH CONFIRMATION, URINE
6 Acetylmorphine: NEGATIVE ng/mL (ref <10)
Alcohol Metabolites: NEGATIVE ng/mL
Amphetamines: NEGATIVE ng/mL (ref <500)
Barbiturates: NEGATIVE ng/mL (ref <300)
Benzodiazepines: NEGATIVE ng/mL (ref <100)
Cocaine Metabolite: NEGATIVE ng/mL (ref <150)
Codeine: NEGATIVE ng/mL (ref <50)
Creatinine: 240 mg/dL
Hydrocodone: 112 ng/mL — ABNORMAL HIGH (ref <50)
Hydromorphone: 119 ng/mL — ABNORMAL HIGH (ref <50)
Marijuana Metabolite: NEGATIVE ng/mL (ref <20)
Methadone Metabolite: NEGATIVE ng/mL (ref <100)
Morphine: NEGATIVE ng/mL (ref <50)
Norhydrocodone: 370 ng/mL — ABNORMAL HIGH (ref <50)
Opiates: POSITIVE ng/mL — AB (ref <100)
Oxidant: NEGATIVE ug/mL
Oxycodone: NEGATIVE ng/mL (ref <100)
Phencyclidine: NEGATIVE ng/mL (ref <25)
pH: 5.3 (ref 4.5–9.0)

## 2020-12-13 LAB — DM TEMPLATE

## 2020-12-14 NOTE — Progress Notes (Signed)
As suspected. No concerns.

## 2020-12-21 ENCOUNTER — Encounter: Payer: Self-pay | Admitting: Physician Assistant

## 2021-01-29 ENCOUNTER — Encounter: Payer: Self-pay | Admitting: Physician Assistant

## 2021-01-29 DIAGNOSIS — G894 Chronic pain syndrome: Secondary | ICD-10-CM

## 2021-02-01 MED ORDER — HYDROCODONE-ACETAMINOPHEN 10-325 MG PO TABS: 1.0000 | ORAL_TABLET | Freq: Three times a day (TID) | ORAL | 0 refills | Status: DC | PRN

## 2021-02-01 NOTE — Telephone Encounter (Signed)
Patient with message requesting Norco refill. PCP is out of the office. UDS and contract updated in February. PDMP reviewed. Sending in refill. Last script sent 12/11/20. [name] Rice Olevia Bowens, DNP, FNP-C

## 2021-02-05 ENCOUNTER — Other Ambulatory Visit: Payer: Self-pay | Admitting: Neurology

## 2021-02-05 DIAGNOSIS — E1142 Type 2 diabetes mellitus with diabetic polyneuropathy: Secondary | ICD-10-CM

## 2021-02-05 MED ORDER — OZEMPIC (0.25 OR 0.5 MG/DOSE) 2 MG/1.5ML ~~LOC~~ SOPN: 0.2500 mg | PEN_INJECTOR | SUBCUTANEOUS | 0 refills | Status: DC

## 2021-02-09 ENCOUNTER — Other Ambulatory Visit: Payer: Self-pay | Admitting: Physician Assistant

## 2021-02-09 DIAGNOSIS — E1142 Type 2 diabetes mellitus with diabetic polyneuropathy: Secondary | ICD-10-CM

## 2021-02-15 DIAGNOSIS — Z969 Presence of functional implant, unspecified: Secondary | ICD-10-CM | POA: Diagnosis not present

## 2021-02-15 DIAGNOSIS — M24573 Contracture, unspecified ankle: Secondary | ICD-10-CM | POA: Diagnosis not present

## 2021-02-15 DIAGNOSIS — S93324S Dislocation of tarsometatarsal joint of right foot, sequela: Secondary | ICD-10-CM | POA: Diagnosis not present

## 2021-02-15 DIAGNOSIS — M19071 Primary osteoarthritis, right ankle and foot: Secondary | ICD-10-CM | POA: Diagnosis not present

## 2021-02-15 DIAGNOSIS — M2061 Acquired deformities of toe(s), unspecified, right foot: Secondary | ICD-10-CM | POA: Diagnosis not present

## 2021-02-15 DIAGNOSIS — S86301A Unspecified injury of muscle(s) and tendon(s) of peroneal muscle group at lower leg level, right leg, initial encounter: Secondary | ICD-10-CM | POA: Diagnosis not present

## 2021-02-15 DIAGNOSIS — M6788 Other specified disorders of synovium and tendon, other site: Secondary | ICD-10-CM | POA: Diagnosis not present

## 2021-02-16 ENCOUNTER — Encounter: Payer: Self-pay | Admitting: Physician Assistant

## 2021-03-08 ENCOUNTER — Ambulatory Visit: Payer: Medicare HMO | Admitting: Physician Assistant

## 2021-03-12 ENCOUNTER — Other Ambulatory Visit: Payer: Self-pay | Admitting: Physician Assistant

## 2021-03-12 DIAGNOSIS — F331 Major depressive disorder, recurrent, moderate: Secondary | ICD-10-CM

## 2021-03-12 DIAGNOSIS — N3281 Overactive bladder: Secondary | ICD-10-CM

## 2021-03-22 ENCOUNTER — Encounter: Payer: Self-pay | Admitting: Physician Assistant

## 2021-03-22 ENCOUNTER — Other Ambulatory Visit: Payer: Self-pay

## 2021-03-22 ENCOUNTER — Ambulatory Visit: Payer: Medicare HMO | Admitting: Physician Assistant

## 2021-03-22 VITALS — BP 144/61 | HR 64 | Ht 63.0 in

## 2021-03-22 DIAGNOSIS — R1310 Dysphagia, unspecified: Secondary | ICD-10-CM | POA: Diagnosis not present

## 2021-03-22 DIAGNOSIS — E1142 Type 2 diabetes mellitus with diabetic polyneuropathy: Secondary | ICD-10-CM | POA: Diagnosis not present

## 2021-03-22 DIAGNOSIS — L304 Erythema intertrigo: Secondary | ICD-10-CM | POA: Diagnosis not present

## 2021-03-22 DIAGNOSIS — K219 Gastro-esophageal reflux disease without esophagitis: Secondary | ICD-10-CM

## 2021-03-22 DIAGNOSIS — G8929 Other chronic pain: Secondary | ICD-10-CM

## 2021-03-22 DIAGNOSIS — H1013 Acute atopic conjunctivitis, bilateral: Secondary | ICD-10-CM | POA: Insufficient documentation

## 2021-03-22 DIAGNOSIS — G894 Chronic pain syndrome: Secondary | ICD-10-CM | POA: Diagnosis not present

## 2021-03-22 DIAGNOSIS — M25511 Pain in right shoulder: Secondary | ICD-10-CM

## 2021-03-22 DIAGNOSIS — J309 Allergic rhinitis, unspecified: Secondary | ICD-10-CM

## 2021-03-22 DIAGNOSIS — L719 Rosacea, unspecified: Secondary | ICD-10-CM

## 2021-03-22 LAB — POCT GLYCOSYLATED HEMOGLOBIN (HGB A1C): Hemoglobin A1C: 6.9 % — AB (ref 4.0–5.6)

## 2021-03-22 MED ORDER — METHYLPREDNISOLONE SODIUM SUCC 125 MG IJ SOLR
125.0000 mg | Freq: Once | INTRAMUSCULAR | Status: AC
  Administered 2021-03-22: 125 mg via INTRAMUSCULAR

## 2021-03-22 MED ORDER — AZELASTINE HCL 0.05 % OP SOLN: 1.0000 [drp] | Freq: Two times a day (BID) | OPHTHALMIC | 1 refills | Status: DC

## 2021-03-22 MED ORDER — HYDROCODONE-ACETAMINOPHEN 10-325 MG PO TABS: 1.0000 | ORAL_TABLET | Freq: Three times a day (TID) | ORAL | 0 refills | Status: AC | PRN

## 2021-03-22 MED ORDER — NYSTATIN 100000 UNIT/GM EX CREA
1.0000 | TOPICAL_CREAM | Freq: Two times a day (BID) | CUTANEOUS | 2 refills | Status: DC
Start: 2021-03-22 — End: 2021-06-16

## 2021-03-22 MED ORDER — METHOCARBAMOL 500 MG PO TABS: 500.0000 mg | ORAL_TABLET | Freq: Three times a day (TID) | ORAL | 0 refills | Status: DC

## 2021-03-22 MED ORDER — METHYLPREDNISOLONE ACETATE 80 MG/ML IJ SUSP
80.0000 mg | Freq: Once | INTRAMUSCULAR | Status: AC
  Administered 2021-03-22: 80 mg via INTRAMUSCULAR

## 2021-03-22 MED ORDER — MONTELUKAST SODIUM 10 MG PO TABS: 10.0000 mg | ORAL_TABLET | Freq: Every day | ORAL | 3 refills | Status: DC

## 2021-03-22 MED ORDER — METRONIDAZOLE 1 % EX GEL: Freq: Every day | CUTANEOUS | 1 refills | Status: DC

## 2021-03-22 MED ORDER — PANTOPRAZOLE SODIUM 40 MG PO TBEC: 40.0000 mg | DELAYED_RELEASE_TABLET | Freq: Every day | ORAL | 3 refills | Status: DC

## 2021-03-22 NOTE — Patient Instructions (Addendum)
Stop pataday and try optivar.  Start singulair but continue xzyal and flonase.  Start protonix and may continue as needed famotidine.  Will get prednisone shots today.  Metronidazole for redness over face.  Nystatin cream to replace ointment

## 2021-03-22 NOTE — Progress Notes (Signed)
Subjective:    Patient ID: [name] Rice, female    DOB: July 12, 1956, 65 y.o.   MRN: [id number]  HPI  Pt is a 65 yo female with HtN, LVH, allergic sinusitis, OSA, T2DM, HLD, GERD, CKD, OAB who presents to the clinic for follow up.   Pt has been having right shoulder pain for years. Seems to be getting worse and losing more and more ROM. Right worse than left. She would like referral. Sports medicine did treat her for this a few times in the past.   DM- not checking sugars. She is not eating well. She is having a lot of pancakes, french fries, sausage. Reflux is worsening some too. She is only on pepcid. She stopped PPI.  Taking actos, tresbia, ozempic, Actos. No open sores or wounds. No hypoycemic events.   Having more allergy symptoms. Taking flonase and xyzal daily. Sneezing, congested. No fever, chills, productive sputum.   Has erythematous rash on nose and cheeks wonders about treatment.   Due to heat having more problems with intertrigo needs cream.   .. Active Ambulatory Problems    Diagnosis Date Noted   Type II or unspecified type diabetes mellitus with neurological manifestations, not stated as uncontrolled(250.60) 08/08/2006   Hyperlipidemia LDL goal <70 08/08/2006   Depression with anxiety 01/28/2008   Hypertension associated with diabetes ([place]) 08/08/2006   Allergic sinusitis 05/27/2009   GERD 08/08/2006   Acute bilateral low back pain 11/27/2007   OSA on CPAP    Insomnia 02/06/2011   Preventative health care 08/16/2011   History of allergic rhinitis 09/07/2011   Iron deficiency anemia 03/29/2012   Lipoma of abdominal wall 05/01/2012   Intestinal bacterial overgrowth 09/20/2012   SOB (shortness of breath) on exertion 03/31/2013   Hepatic steatosis 09/26/2013   Diverticulitis 11/27/2013   Diabetic peripheral neuropathy ([place]) 05/30/2014   Gastroparesis due to secondary diabetes ([place] ) 09/16/2014   Ear pain 12/09/2014   Type 2 diabetes, controlled, with peripheral  neuropathy ([place]) 12/09/2014   Diarrhea 01/12/2015   Non-proliferative diabetic retinopathy, right eye ([place]) 11/23/2015   Chronic pain syndrome 12/15/2015   Morbid obesity due to excess calories ([place]) 02/16/2016   Unstable balance 02/16/2016   Intertrigo 03/15/2016   Left knee pain 03/15/2016   Post-menopausal 03/16/2016   Acute reaction to stress 03/28/2016   Dystonia 09/21/2016   Neck pain 07/11/2017   Lisfranc's dislocation, right, initial encounter 10/06/2017   Freiberg's disease, right 10/06/2017   Vasomotor rhinitis 11/28/2017   Ear itching 01/17/2018   No energy 01/17/2018   Hordeolum externum of right lower eyelid 02/16/2018   Hot flashes 02/16/2018   Restrictive lung disease 04/12/2018   Mixed hyperlipidemia 04/12/2018   Vitamin D insufficiency 04/12/2018   Drug-induced constipation 05/23/2018   Calcification and ossification of muscle 07/13/2018   Pressure injury of right foot, unstageable ([place]) 09/25/2018   Bilateral carpal tunnel syndrome post carpal tunnel release on the left 01/24/2019   Type 2 diabetes mellitus with diabetic neuropathy, with long-term current use of insulin ([place]) 06/20/2019   Dizziness 06/20/2019   Acute right ankle pain 09/25/2019   OAB (overactive bladder) 09/25/2019   Chronic kidney disease (CKD), stage III (moderate) ([place]) 10/04/2019   Trigger finger, right middle finger 12/24/2019   Trigger index finger of right hand 12/24/2019   DDD (degenerative disc disease), cervical 12/27/2019   Bilateral rotator cuff syndrome 01/24/2020   Paresthesia 06/30/2020   Weakness 06/30/2020   Chronic fatigue 06/30/2020   Moderate episode of recurrent  major depressive disorder ([place]) 07/14/2020   Left ventricular hypertrophy 07/23/2020   Problems with swallowing 03/22/2021   Allergic conjunctivitis of both eyes 03/22/2021   Rosacea 04/02/2021   Resolved Ambulatory Problems    Diagnosis Date Noted   IRON DEFICIENCY 01/30/2007   DEPRESSION 08/08/2006    TORTICOLLIS, SPASMODIC 02/13/2007   ALLERGIC RHINITIS 12/25/2006   ASTHMA 01/28/2008   COPD 08/08/2006   DIVERTICULOSIS, COLON 02/12/2003   DERMATITIS, SEBORRHEIC 01/30/2007   BPPV (benign paroxysmal positional vertigo), unspecified laterality 07/14/2009   GOUT, HX OF 08/08/2006   PERSONAL HX COLONIC POLYPS 09/16/2008   NEPHROLITHIASIS, HX OF 01/28/2008   Calf pain 11/29/2010   Tremor, essential 02/06/2011   Seborrheic dermatitis, unspecified 04/06/2011   Finger pain 04/06/2011   Shoulder pain, right 08/16/2011   Left leg pain 04/10/2012   Right ear pain 05/01/2012   Seborrheic dermatitis 05/01/2012   Hypoglycemia 05/01/2012   Vaginal candidiasis 08/22/2012   Sigmoid diverticulitis 09/15/2012   Acute-on-chronic kidney disease Stage II 09/15/2012   Cyst, breast 03/01/2013   Lactic acidosis 03/31/2013   Acute renal insufficiency 03/31/2013   Abdominal pain, left lower quadrant 04/29/2013   Gout 05/01/2013   COPD (chronic obstructive pulmonary disease) with chronic bronchitis ([place]) 12/09/2014   Right shoulder pain 02/16/2016   Fall 03/16/2016   Foot fracture, right, closed, initial encounter 08/23/2017   Chronic pain of both shoulders 12/24/2019   Past Medical History:  Diagnosis Date   Anxiety    Arthritis    Asthma    Carpal tunnel syndrome, bilateral    Cervical dystonia    COPD (chronic obstructive pulmonary disease) ([place])    Depression    Diabetes mellitus    Diverticulosis    Gastroparesis    GERD (gastroesophageal reflux disease)    History of colonic polyps    History of kidney stones    History of seborrheic dermatitis    HLD (hyperlipidemia)    Hypertension    Intussusception ([place])    Lisfranc's sprain, left, initial encounter 10/06/2017   Lumbar pain 2011   Morbid obesity ([place])    Nephrolithiasis    Pneumonia    Recurrent boils    Sleep apnea    Torn rotator cuff RT SHOULDER   Tremor CCENTRAL NERVOUS SYSTEM     Review of Systems See hPI.      Objective:   Physical Exam Vitals reviewed.  Constitutional:      Appearance: She is obese.     Comments: In wheelchair  HENT:     Head: Normocephalic.  Cardiovascular:     Rate and Rhythm: Normal rate and regular rhythm.  Pulmonary:     Effort: Pulmonary effort is normal.     Breath sounds: Normal breath sounds.  Musculoskeletal:     Comments: Decreased ROM of both shoulders.   Neurological:     General: No focal deficit present.     Mental Status: She is alert and oriented to person, place, and time.  Psychiatric:        Mood and Affect: Mood normal.         .. Results for orders placed or performed in visit on 03/22/21  POCT glycosylated hemoglobin (Hb A1C)  Result Value Ref Range   Hemoglobin A1C 6.9 (A) 4.0 - 5.6 %   HbA1c POC (<> result, manual entry)     HbA1c, POC (prediabetic range)     HbA1c, POC (controlled diabetic range)     *Note: Due to a large number  of results and/or encounters for the requested time period, some results have not been displayed. A complete set of results can be found in Results Review.    Assessment & Plan:  Marland KitchenMarland KitchenTuwanda was seen today for diabetes.  Diagnoses and all orders for this visit:  Type 2 diabetes, controlled, with peripheral neuropathy (HCC) -     POCT glycosylated hemoglobin (Hb A1C)  Allergic conjunctivitis of both eyes -     azelastine (OPTIVAR) 0.05 % ophthalmic solution; Place 1 drop into both eyes 2 (two) times daily. -     montelukast (SINGULAIR) 10 MG tablet; Take 1 tablet (10 mg total) by mouth at bedtime. -     methylPREDNISolone sodium succinate (SOLU-MEDROL) 125 mg/2 mL injection 125 mg -     methylPREDNISolone acetate (DEPO-MEDROL) injection 80 mg  Problems with swallowing  Allergic sinusitis -     methylPREDNISolone sodium succinate (SOLU-MEDROL) 125 mg/2 mL injection 125 mg -     methylPREDNISolone acetate (DEPO-MEDROL) injection 80 mg  Rosacea -     metroNIDAZOLE (METROGEL) 1 % gel; Apply topically  daily.  Chronic pain syndrome -     methocarbamol (ROBAXIN) 500 MG tablet; Take 1 tablet (500 mg total) by mouth 3 (three) times daily. -     HYDROcodone-acetaminophen (NORCO) 10-325 MG tablet; Take 1 tablet by mouth every 8 (eight) hours as needed.  Gastroesophageal reflux disease, unspecified whether esophagitis present -     pantoprazole (PROTONIX) 40 MG tablet; Take 1 tablet (40 mg total) by mouth daily.  Intertrigo -     nystatin cream (MYCOSTATIN); Apply 1 application topically 2 (two) times daily.  Stop pataday and try optivar.  Start singulair but continue xzyal and flonase.  Start protonix and may continue as needed famotidine.  Will get prednisone shot today for allergic sinusitis symptoms. No sign of infection.  Metronidazole for redness over face.  Nystatin cream to replace ointment  A1C still to goal.  Continue medications.  Watch diet closer.  BP not to goal. Pt had not taken medications yet.  On STATIN.  Needs eye exam.  Foot exam UTD.  Covid needs boosters.  Pneumonia/flu UTD.  Follow up in 3 months.   Referral made to ortho.

## 2021-03-23 ENCOUNTER — Encounter: Payer: Self-pay | Admitting: Physician Assistant

## 2021-04-01 ENCOUNTER — Encounter: Payer: Self-pay | Admitting: Physician Assistant

## 2021-04-02 DIAGNOSIS — G8929 Other chronic pain: Secondary | ICD-10-CM | POA: Insufficient documentation

## 2021-04-02 DIAGNOSIS — L719 Rosacea, unspecified: Secondary | ICD-10-CM | POA: Insufficient documentation

## 2021-04-02 MED ORDER — FLUCONAZOLE 150 MG PO TABS: 150.0000 mg | ORAL_TABLET | Freq: Once | ORAL | 0 refills | Status: AC

## 2021-04-04 ENCOUNTER — Other Ambulatory Visit: Payer: Self-pay | Admitting: Physician Assistant

## 2021-04-04 DIAGNOSIS — E1142 Type 2 diabetes mellitus with diabetic polyneuropathy: Secondary | ICD-10-CM

## 2021-04-07 DIAGNOSIS — J449 Chronic obstructive pulmonary disease, unspecified: Secondary | ICD-10-CM | POA: Diagnosis not present

## 2021-04-07 DIAGNOSIS — G4733 Obstructive sleep apnea (adult) (pediatric): Secondary | ICD-10-CM | POA: Diagnosis not present

## 2021-04-12 ENCOUNTER — Ambulatory Visit: Payer: Medicare HMO | Admitting: Orthopaedic Surgery

## 2021-04-26 ENCOUNTER — Ambulatory Visit: Payer: Medicare HMO | Admitting: Orthopaedic Surgery

## 2021-05-10 ENCOUNTER — Encounter: Payer: Self-pay | Admitting: Orthopaedic Surgery

## 2021-05-10 ENCOUNTER — Ambulatory Visit (INDEPENDENT_AMBULATORY_CARE_PROVIDER_SITE_OTHER): Payer: Medicare HMO

## 2021-05-10 ENCOUNTER — Other Ambulatory Visit: Payer: Self-pay

## 2021-05-10 ENCOUNTER — Ambulatory Visit: Payer: Medicare HMO | Admitting: Orthopaedic Surgery

## 2021-05-10 ENCOUNTER — Ambulatory Visit: Payer: Self-pay

## 2021-05-10 DIAGNOSIS — G8929 Other chronic pain: Secondary | ICD-10-CM | POA: Diagnosis not present

## 2021-05-10 DIAGNOSIS — M25512 Pain in left shoulder: Secondary | ICD-10-CM | POA: Diagnosis not present

## 2021-05-10 DIAGNOSIS — M25511 Pain in right shoulder: Secondary | ICD-10-CM | POA: Diagnosis not present

## 2021-05-10 MED ORDER — METHYLPREDNISOLONE ACETATE 40 MG/ML IJ SUSP
40.0000 mg | INTRAMUSCULAR | Status: AC | PRN
  Administered 2021-05-10: 40 mg via INTRA_ARTICULAR

## 2021-05-10 MED ORDER — LIDOCAINE HCL 1 % IJ SOLN
3.0000 mL | INTRAMUSCULAR | Status: AC | PRN
  Administered 2021-05-10: 3 mL

## 2021-05-10 NOTE — Progress Notes (Signed)
Office Visit Note   Patient: [name] Rice           Date of Birth: [birth year]           MRN: [id number] Visit Date: 05/10/2021              Requested by: Donella Stade, PA-C [place],  [place] [address] PCP: Donella Stade, PA-C   Assessment & Plan: Visit Diagnoses:  1. Chronic pain of both shoulders   2. Chronic left shoulder pain   3. Chronic right shoulder pain     Plan: I felt it was reasonable to try a steroid injection in her left shoulder subacromial outlet.  It was certainly quite difficult given her large soft tissue envelope around the shoulder but I did feel like I got it in the subacromial outlet.  She is a perfect candidate for physical therapy for any modalities they can improve her shoulder function with her both shoulders and decrease her pain.  I did not inject her right shoulder today given the fact she is diabetic.  We can consider that on her next visit in 4 weeks from now.  We will work on setting up outpatient physical therapy in [place] and then see her back in 4 weeks from now.  All questions and concerns were answered and addressed.  Follow-Up Instructions: Return in about 4 weeks (around 06/07/2021).   Orders:  Orders Placed This Encounter  Procedures   Large Joint Inj   XR Shoulder Right   XR Shoulder Left   No orders of the defined types were placed in this encounter.     Procedures: Large Joint Inj: L subacromial bursa on 05/10/2021 3:23 PM Indications: pain and diagnostic evaluation Details: 22 G 1.5 in needle  Arthrogram: No  Medications: 3 mL lidocaine 1 %; 40 mg methylPREDNISolone acetate 40 MG/ML Outcome: tolerated well, no immediate complications Procedure, treatment alternatives, risks and benefits explained, specific risks discussed. Consent was given by the patient. Immediately prior to procedure a time out was called to verify the correct patient, procedure, equipment, support staff and site/side  marked as required. Patient was prepped and draped in the usual sterile fashion.      Clinical Data: No additional findings.   Subjective: Chief Complaint  Patient presents with   Right Shoulder - Pain   Left Shoulder - Pain  The patient is a 65 year old female who comes in with bilateral shoulder pain with the left worse than right.  She had right shoulder surgery by someone in 2016.  Both shoulders hurt but the right 1 is not as bad and she can move it better.  Left shoulder is been more stiff.  She is someone who is mainly wheelchair-bound due to an old foot injury on the right side that required multiple surgeries.  She is a diabetic and has a hemoglobin A1c most recently of 6.9.  She is someone who also is morbidly obese.  HPI  Review of Systems There is currently listed no headache, chest pain, shortness of breath, fever, chills, nausea, vomiting  Objective: Vital Signs: LMP 10/18/1995   Physical Exam She is alert and oriented x3 and in no acute distress Ortho Exam Examination of both shoulder shows some signs of impingement but both shoulders are well located with fluid range of motion but definitely painful with overhead activities.  Her right shoulder moves more fluidly than her stiff her left shoulder. Specialty Comments:  No  specialty comments available.  Imaging: XR Shoulder Left  Result Date: 05/10/2021 3 views of the left shoulder show no acute findings.  The shoulder is well located.  XR Shoulder Right  Result Date: 05/10/2021 3 views of the right shoulder show well located shoulder with no complicating features.  There is calcifications and soft tissue.  There is probably some mild to moderate arthritic changes.    PMFS History: Patient Active Problem List   Diagnosis Date Noted   Rosacea 04/02/2021   Chronic right shoulder pain 04/02/2021   Problems with swallowing 03/22/2021   Allergic conjunctivitis of both eyes 03/22/2021   Left ventricular  hypertrophy 07/23/2020   Moderate episode of recurrent major depressive disorder ([place]) 07/14/2020   Paresthesia 06/30/2020   Weakness 06/30/2020   Chronic fatigue 06/30/2020   Bilateral rotator cuff syndrome 01/24/2020   DDD (degenerative disc disease), cervical 12/27/2019   Trigger finger, right middle finger 12/24/2019   Trigger index finger of right hand 12/24/2019   Chronic kidney disease (CKD), stage III (moderate) (HCC) 10/04/2019   Acute right ankle pain 09/25/2019   OAB (overactive bladder) 09/25/2019   Type 2 diabetes mellitus with diabetic neuropathy, with long-term current use of insulin ([place]) 06/20/2019   Dizziness 06/20/2019   Bilateral carpal tunnel syndrome post carpal tunnel release on the left 01/24/2019   Pressure injury of right foot, unstageable ([place]) 09/25/2018   Calcification and ossification of muscle 07/13/2018   Drug-induced constipation 05/23/2018   Restrictive lung disease 04/12/2018   Mixed hyperlipidemia 04/12/2018   Vitamin D insufficiency 04/12/2018   Hordeolum externum of right lower eyelid 02/16/2018   Hot flashes 02/16/2018   Ear itching 01/17/2018   No energy 01/17/2018   Vasomotor rhinitis 11/28/2017   Lisfranc's dislocation, right, initial encounter 10/06/2017   Freiberg's disease, right 10/06/2017   Neck pain 07/11/2017   Dystonia 09/21/2016   Acute reaction to stress 03/28/2016   Post-menopausal 03/16/2016   Intertrigo 03/15/2016   Left knee pain 03/15/2016   Morbid obesity due to excess calories ([place]) 02/16/2016   Unstable balance 02/16/2016   Chronic pain syndrome 12/15/2015   Non-proliferative diabetic retinopathy, right eye ([place]) 11/23/2015   Diarrhea 01/12/2015   Ear pain 12/09/2014   Type 2 diabetes, controlled, with peripheral neuropathy ([place]) 12/09/2014   Gastroparesis due to secondary diabetes ([place]) 09/16/2014   Diabetic peripheral neuropathy ([place]) 05/30/2014   Diverticulitis 11/27/2013   Hepatic steatosis 09/26/2013   SOB  (shortness of breath) on exertion 03/31/2013   Intestinal bacterial overgrowth 09/20/2012   Lipoma of abdominal wall 05/01/2012   Iron deficiency anemia 03/29/2012   History of allergic rhinitis 09/07/2011   Preventative health care 08/16/2011   Insomnia 02/06/2011   OSA on CPAP    Allergic sinusitis 05/27/2009   Depression with anxiety 01/28/2008   Acute bilateral low back pain 11/27/2007   Type II or unspecified type diabetes mellitus with neurological manifestations, not stated as uncontrolled(250.60) 08/08/2006   Hyperlipidemia LDL goal <70 08/08/2006   Hypertension associated with diabetes ([place]) 08/08/2006   GERD 08/08/2006   Past Medical History:  Diagnosis Date   Anxiety    Arthritis    Asthma    no PFTs   Carpal tunnel syndrome, bilateral    Cervical dystonia    COPD (chronic obstructive pulmonary disease) ([place])    no PFTs   Depression    Diabetes mellitus    Diarrhea    Diverticulitis    Diverticulosis    Dizziness    Gastroparesis  GERD (gastroesophageal reflux disease)    History of colonic polyps    hyperplastic   History of kidney stones    History of seborrheic dermatitis    face,ears   HLD (hyperlipidemia)    Hypertension    Intussusception ([place])    Lisfranc's sprain, left, initial encounter 10/06/2017   Lumbar pain 2011   per x-ray - Multilevel spondylosis   Morbid obesity ([place])    Nephrolithiasis    OSA on CPAP 2008   Pneumonia    Recurrent boils    "SLUSTER BOILS" ON ABD AND LABIA   Sleep apnea    USES C-PAP   Torn rotator cuff RT SHOULDER   Tremor CCENTRAL NERVOUS SYSTEM    Family History  Problem Relation Age of Onset   Heart disease Brother    Heart attack Brother    Depression Brother    Diabetes Brother    Hypertension Brother    Hyperlipidemia Brother    Diabetes Mother    Hyperlipidemia Mother    Hypertension Mother    Kidney disease Mother    Depression Sister    Diabetes Sister    Hyperlipidemia Sister    Hypertension  Sister    Stroke Sister    Heart attack Brother    Depression Brother    Diabetes Brother    Hyperlipidemia Brother    Hypertension Brother    Kidney cancer Brother    Cancer Neg Hx     Past Surgical History:  Procedure Laterality Date   CARPAL TUNNEL RELEASE Left 2006   ELBOW SURGERY Left    HAND SURGERY Right RIGHT   KNEE ARTHROSCOPY Left    LUMBAR LAMINECTOMY     MASS EXCISION  06/28/2012   Procedure: EXCISION MASS;  Surgeon: Harl Bowie, MD;  Location: [facility];  Service: General;  Laterality: Left;  excision left upper quadrant abdominal wall mass, Benign Lipoma   NASAL SINUS SURGERY     PILONIDAL CYST EXCISION  [id number]   SUBACROMIAL DECOMPRESSION Right    TUBAL LIGATION     Social History   Occupational History   Occupation: disabled  Tobacco Use   Smoking status: Former    Types: Cigarettes    Quit date: 10/17/1996    Years since quitting: 24.5   Smokeless tobacco: Never  Vaping Use   Vaping Use: Never used  Substance and Sexual Activity   Alcohol use: No   Drug use: No   Sexual activity: Not Currently

## 2021-05-17 ENCOUNTER — Ambulatory Visit: Payer: Medicare HMO | Admitting: Rehabilitative and Restorative Service Providers"

## 2021-05-19 ENCOUNTER — Encounter: Payer: Self-pay | Admitting: Physician Assistant

## 2021-05-19 MED ORDER — FLUCONAZOLE 150 MG PO TABS: 150.0000 mg | ORAL_TABLET | Freq: Once | ORAL | 0 refills | Status: AC

## 2021-05-21 ENCOUNTER — Ambulatory Visit: Payer: Medicare HMO | Admitting: Rehabilitative and Restorative Service Providers"

## 2021-05-24 ENCOUNTER — Encounter: Payer: Medicare HMO | Admitting: Rehabilitative and Restorative Service Providers"

## 2021-05-24 ENCOUNTER — Other Ambulatory Visit: Payer: Self-pay | Admitting: Neurology

## 2021-05-24 DIAGNOSIS — I10 Essential (primary) hypertension: Secondary | ICD-10-CM

## 2021-05-24 DIAGNOSIS — H1013 Acute atopic conjunctivitis, bilateral: Secondary | ICD-10-CM

## 2021-05-24 DIAGNOSIS — K219 Gastro-esophageal reflux disease without esophagitis: Secondary | ICD-10-CM

## 2021-05-24 MED ORDER — AZELASTINE HCL 0.05 % OP SOLN: 1.0000 [drp] | Freq: Two times a day (BID) | OPHTHALMIC | 1 refills | Status: DC

## 2021-05-24 MED ORDER — MONTELUKAST SODIUM 10 MG PO TABS
10.0000 mg | ORAL_TABLET | Freq: Every day | ORAL | 2 refills | Status: DC
Start: 2021-05-24 — End: 2022-03-07

## 2021-05-24 MED ORDER — PANTOPRAZOLE SODIUM 40 MG PO TBEC: 40.0000 mg | DELAYED_RELEASE_TABLET | Freq: Every day | ORAL | 2 refills | Status: DC

## 2021-05-27 ENCOUNTER — Encounter: Payer: Medicare HMO | Admitting: Rehabilitative and Restorative Service Providers"

## 2021-05-31 ENCOUNTER — Other Ambulatory Visit: Payer: Self-pay

## 2021-05-31 ENCOUNTER — Ambulatory Visit (INDEPENDENT_AMBULATORY_CARE_PROVIDER_SITE_OTHER): Payer: Medicare HMO | Admitting: Rehabilitative and Restorative Service Providers"

## 2021-05-31 DIAGNOSIS — G8929 Other chronic pain: Secondary | ICD-10-CM

## 2021-05-31 DIAGNOSIS — M25512 Pain in left shoulder: Secondary | ICD-10-CM

## 2021-05-31 DIAGNOSIS — M25511 Pain in right shoulder: Secondary | ICD-10-CM

## 2021-05-31 DIAGNOSIS — M6281 Muscle weakness (generalized): Secondary | ICD-10-CM

## 2021-05-31 NOTE — Patient Instructions (Signed)
Access Code: [facility] URL: [email or web address] Date: 05/31/2021 Prepared by: Rudell Cobb  Exercises Seated Shoulder Flexion Towel Slide at Table Top - 2 x daily - 7 x weekly - 1 sets - 10 reps Seated Shoulder Abduction Towel Slide at Table Top - 2 x daily - 7 x weekly - 1 sets - 10 reps Seated Shoulder External Rotation - 2 x daily - 7 x weekly - 1 sets - 10 reps Seated Shoulder Flexion AAROM with [place] - 2 x daily - 7 x weekly - 1 sets - 10 reps

## 2021-05-31 NOTE — Therapy (Signed)
[place], [place], [address] Phone: [phone]   Fax:  [phone]  Physical Therapy Evaluation  Patient Details  Name: [name] Rice MRN: [id number] Date of Birth: Oct 15, 1956 Referring Provider (PT): Jean Rosenthal, MD   Encounter Date: 05/31/2021   PT End of Session - 05/31/21 [id number]     Visit Number 1    Number of Visits 12    Date for PT Re-Evaluation 07/25/19    Authorization Type humana medicare    PT Start Time 1345    PT Stop Time 1430    PT Time Calculation (min) 45 min    Activity Tolerance Patient tolerated treatment well    Behavior During Therapy [facility] for tasks assessed/performed             Past Medical History:  Diagnosis Date   Anxiety    Arthritis    Asthma    no PFTs   Carpal tunnel syndrome, bilateral    Cervical dystonia    COPD (chronic obstructive pulmonary disease) ([place])    no PFTs   Depression    Diabetes mellitus    Diarrhea    Diverticulitis    Diverticulosis    Dizziness    Gastroparesis    GERD (gastroesophageal reflux disease)    History of colonic polyps    hyperplastic   History of kidney stones    History of seborrheic dermatitis    face,ears   HLD (hyperlipidemia)    Hypertension    Intussusception ([place])    Lisfranc's sprain, left, initial encounter 10/06/2017   Lumbar pain 2011   per x-ray - Multilevel spondylosis   Morbid obesity ([place])    Nephrolithiasis    OSA on CPAP 2008   Pneumonia    Recurrent boils    "SLUSTER BOILS" ON ABD AND LABIA   Sleep apnea    USES C-PAP   Torn rotator cuff RT SHOULDER   Tremor CCENTRAL NERVOUS SYSTEM    Past Surgical History:  Procedure Laterality Date   CARPAL TUNNEL RELEASE Left 2006   ELBOW SURGERY Left    HAND SURGERY Right RIGHT   KNEE ARTHROSCOPY Left    LUMBAR LAMINECTOMY     MASS EXCISION  06/28/2012   Procedure: EXCISION MASS;  Surgeon: Harl Bowie, MD;  Location: [facility];   Service: General;  Laterality: Left;  excision left upper quadrant abdominal wall mass, Benign Lipoma   NASAL SINUS SURGERY     PILONIDAL CYST EXCISION  [id number]   SUBACROMIAL DECOMPRESSION Right    TUBAL LIGATION      There were no vitals filed for this visit.   Subjective Assessment - 05/31/21 1350     Subjective The patient arrives to PT in a wheelchair today.  She began with difficulty in 2016 in her Right shoulder with a shoulder surgery.  The left shoulder began hurting after she became more w/c dependent in 2021.  The L shoulder is getting worse.  The R sholder doesn't hurt that much anymore.  She walks the w/c with her feet and occasionally has to use her UEs.   She stays between her wheelchair and the bed noting she is in the bed most of the time.  She has not had any PT in the past for her shoulder.    Pertinent History h/o 3 surgeries R foot, anxiety, arthritis, CTS bilat, COPD, cervical dytonia, depression, diabetes,    Patient Stated Goals reduce pain and walk (  PT discussed this referral for shoulders-- will look at walking if another order re: foot or back is sent).    Currently in Pain? Yes    Pain Score 5     Pain Location Shoulder    Pain Orientation Left;Right    Pain Type Chronic pain    Pain Onset More than a month ago    Aggravating Factors  morning    Pain Relieving Factors warm water                [facility] PT Assessment - 05/31/21 1356       Assessment   Medical Diagnosis bilateral chronic shoulder pain    Referring Provider (PT) Jean Rosenthal, MD    Onset Date/Surgical Date 05/10/21    Hand Dominance Right      Precautions   Precautions Fall      Restrictions   Weight Bearing Restrictions No      Balance Screen   Has the patient fallen in the past 6 months No    Has the patient had a decrease in activity level because of a fear of falling?  Yes   significantly limited mobility   Is the patient reluctant to leave their home because of a  fear of falling?  No      Home Environment   Living Environment Private residence    Living Arrangements Other relatives   grandson that is 69 years old   Home Access Ramped entrance    [place] One level    [place] - 4 wheels;Wheelchair - manual;Grab bars - toilet;Tub bench      Prior Function   Level of Independence Independent    Vocation On disability      Observation/Other Assessments   Focus on Therapeutic Outcomes (FOTO)  n/a due to significant mobility limitations      Sensation   Light Touch Impaired Detail   numbness in hands and feet     Posture/Postural Control   Posture/Postural Control Postural limitations    Posture Comments elevated, rounded shoulders      ROM / Strength   AROM / PROM / Strength AROM;PROM;Strength      AROM   Overall AROM  Deficits    Overall AROM Comments elbow flexion WFLs, mild tightness noted for elbow extension.  Did AROM in sitting to assess strength and ablity to lift against gravity    AROM Assessment Site Shoulder    Right/Left Shoulder Right;Left    Right Shoulder Flexion 95 Degrees    Right Shoulder ABduction 40 Degrees   abnormal movement using scapular muscles, not deltoids   Left Shoulder Flexion 64 Degrees   in sitting   Left Shoulder ABduction 30 Degrees   abnormal movement using scapular muscles, not deltoids   Left Shoulder External Rotation --   uses shoulder shrug and scapular elevation to reach behind head     PROM   Overall PROM  Deficits    PROM Assessment Site Shoulder    Right/Left Shoulder Right;Left    Right Shoulder Flexion --   122   Left Shoulder Flexion 128 Degrees      Strength   Overall Strength Deficits    Overall Strength Comments does not have strength to lift against gravity throughout available ROM                           [facility] Adult PT Treatment/Exercise - 05/31/21 1356  Exercises   Exercises Shoulder      Shoulder Exercises: Supine   Other Supine  Exercises *ATTEMPTED:  Patient was not able to tolerate supine exercises due to low back pain.  PT assisted with sit<>supine transfer and propped LEs to reduce back pain.  She was unable to tolerate this position, therefore moved into sitting.      Shoulder Exercises: Seated   Retraction Strengthening;Both;10 reps    Retraction Limitations seated Ls for retraction/ER    Flexion AAROM;10 reps;Right;Left    Flexion Limitations countertop slide using side of sink with washcloth to use w/c cutout-- patient has this available at home in her bathroom.    Abduction AAROM;Left;10 reps    ABduction Limitations countertop slide using side of sink with washcloth to use w/c cutout-- patient has this available at home in her bathroom.                    PT Education - 05/31/21 1426     Education Details HEP    Person(s) Educated Patient    Methods Explanation;Demonstration;Handout    Comprehension Returned demonstration;Verbalized understanding                 PT Long Term Goals - 05/31/21 1603       PT LONG TERM GOAL #1   Title The patient will be indep with HEP.    Time 6    Period Weeks    Target Date 07/12/21      PT LONG TERM GOAL #2   Title The patient will reduce L shoulder pain from 5/10 to < or equal to 2/10    Time 6    Period Weeks    Target Date 07/12/21      PT LONG TERM GOAL #3   Title The patient will improve AROM left shoulder to 90 degrees in sitting.    Time 6    Period Weeks    Target Date 07/12/21      PT LONG TERM GOAL #4   Title The patient will be able to stand at a counter and reach to shoulder height shelf to improve ADLs.    Time 6    Period Weeks    Target Date 07/12/21                   Plan - 05/31/21 1607     Clinical Impression Statement The patient is a 66 year old female presenting to OP physical therapy for chronic bilat shoulder pain.  She also notes desire to work on standing/walking.  Since she is s/p 3 foot  surgeries, PT would want MD order if okay to work on general mobility.  The patient presents with shoulder impairments including postural shortening, dec'd scapular stability, dec'd shoulder ROM, muscle weakness, and pain leading to limited functional use of UEs.  PT to address to optimize current functional status.    Personal Factors and Comorbidities Comorbidity 3+;Time since onset of injury/illness/exacerbation    Comorbidities obesity, HTN, chronic pain, DM, depression, anxiety, COPD, asthma, arthritis    Examination-Activity Limitations Bed Mobility;Lift;Reach Overhead;Carry;Dressing;Hygiene/Grooming    Examination-Participation Restrictions Meal Prep;Cleaning    Stability/Clinical Decision Making Evolving/Moderate complexity    Clinical Decision Making Moderate    Rehab Potential Good    PT Frequency 2x / week    PT Duration 6 weeks    PT Treatment/Interventions ADLs/Self Care Home Management;Therapeutic exercise;Therapeutic activities;Neuromuscular re-education;Patient/family education;Taping;Manual techniques;Cryotherapy;Moist Heat;Passive range of motion    PT  Next Visit Plan plan to continue to progress AAROM *we did this in sitting at eval (supine increaes LBP), UE strengthening, may be able to stand with counter support to work on functional reaching.  *Didn't put modalities in (except heat/ice) due to patient report of increased calcification under the skin*    PT Home Exercise Plan Access Code: [email or web address]    Consulted and Agree with Plan of Care Patient             Patient will benefit from skilled therapeutic intervention in order to improve the following deficits and impairments:  Pain, Hypomobility, Impaired flexibility, Postural dysfunction, Decreased strength, Decreased range of motion, Increased fascial restricitons, Impaired tone, Impaired UE functional use, Decreased activity tolerance  Visit Diagnosis: Chronic left shoulder pain  Chronic right shoulder pain  Muscle  weakness (generalized)     Problem List Patient Active Problem List   Diagnosis Date Noted   Rosacea 04/02/2021   Chronic right shoulder pain 04/02/2021   Problems with swallowing 03/22/2021   Allergic conjunctivitis of both eyes 03/22/2021   Left ventricular hypertrophy 07/23/2020   Moderate episode of recurrent major depressive disorder ([place]) 07/14/2020   Paresthesia 06/30/2020   Weakness 06/30/2020   Chronic fatigue 06/30/2020   Bilateral rotator cuff syndrome 01/24/2020   DDD (degenerative disc disease), cervical 12/27/2019   Trigger finger, right middle finger 12/24/2019   Trigger index finger of right hand 12/24/2019   Chronic kidney disease (CKD), stage III (moderate) (HCC) 10/04/2019   Acute right ankle pain 09/25/2019   OAB (overactive bladder) 09/25/2019   Type 2 diabetes mellitus with diabetic neuropathy, with long-term current use of insulin ([place]) 06/20/2019   Dizziness 06/20/2019   Bilateral carpal tunnel syndrome post carpal tunnel release on the left 01/24/2019   Pressure injury of right foot, unstageable ([place]) 09/25/2018   Calcification and ossification of muscle 07/13/2018   Drug-induced constipation 05/23/2018   Restrictive lung disease 04/12/2018   Mixed hyperlipidemia 04/12/2018   Vitamin D insufficiency 04/12/2018   Hordeolum externum of right lower eyelid 02/16/2018   Hot flashes 02/16/2018   Ear itching 01/17/2018   No energy 01/17/2018   Vasomotor rhinitis 11/28/2017   Lisfranc's dislocation, right, initial encounter 10/06/2017   Freiberg's disease, right 10/06/2017   Neck pain 07/11/2017   Dystonia 09/21/2016   Acute reaction to stress 03/28/2016   Post-menopausal 03/16/2016   Intertrigo 03/15/2016   Left knee pain 03/15/2016   Morbid obesity due to excess calories ([place]) 02/16/2016   Unstable balance 02/16/2016   Chronic pain syndrome 12/15/2015   Non-proliferative diabetic retinopathy, right eye ([place]) 11/23/2015   Diarrhea 01/12/2015   Ear  pain 12/09/2014   Type 2 diabetes, controlled, with peripheral neuropathy ([place]) 12/09/2014   Gastroparesis due to secondary diabetes ([place]) 09/16/2014   Diabetic peripheral neuropathy ([place]) 05/30/2014   Diverticulitis 11/27/2013   Hepatic steatosis 09/26/2013   SOB (shortness of breath) on exertion 03/31/2013   Intestinal bacterial overgrowth 09/20/2012   Lipoma of abdominal wall 05/01/2012   Iron deficiency anemia 03/29/2012   History of allergic rhinitis 09/07/2011   Preventative health care 08/16/2011   Insomnia 02/06/2011   OSA on CPAP    Allergic sinusitis 05/27/2009   Depression with anxiety 01/28/2008   Acute bilateral low back pain 11/27/2007   Type II or unspecified type diabetes mellitus with neurological manifestations, not stated as uncontrolled(250.60) 08/08/2006   Hyperlipidemia LDL goal <70 08/08/2006   Hypertension associated with diabetes ([place]) 08/08/2006   GERD 08/08/2006  Rudell Cobb, MPT  [place] 05/31/2021, 4:23 PM  [facility] [place], [place], [address] Phone: [phone]   Fax:  [phone]  Name: [name] Rice MRN: [id number] Date of Birth: Oct 23, 1955

## 2021-06-07 ENCOUNTER — Other Ambulatory Visit: Payer: Self-pay | Admitting: Physician Assistant

## 2021-06-07 ENCOUNTER — Encounter: Payer: Self-pay | Admitting: Orthopaedic Surgery

## 2021-06-07 ENCOUNTER — Ambulatory Visit: Payer: Medicare HMO | Admitting: Orthopaedic Surgery

## 2021-06-07 DIAGNOSIS — M25512 Pain in left shoulder: Secondary | ICD-10-CM

## 2021-06-07 DIAGNOSIS — G8929 Other chronic pain: Secondary | ICD-10-CM

## 2021-06-07 DIAGNOSIS — M25511 Pain in right shoulder: Secondary | ICD-10-CM

## 2021-06-07 DIAGNOSIS — I152 Hypertension secondary to endocrine disorders: Secondary | ICD-10-CM

## 2021-06-07 DIAGNOSIS — E1142 Type 2 diabetes mellitus with diabetic polyneuropathy: Secondary | ICD-10-CM

## 2021-06-07 DIAGNOSIS — E559 Vitamin D deficiency, unspecified: Secondary | ICD-10-CM

## 2021-06-07 NOTE — Progress Notes (Signed)
The patient comes in today with continued left shoulder pain.  She is someone who is mainly wheelchair-bound and has to use her shoulders quite a bit for mobility through her wheelchair.  We did place a steroid injection of the subacromial outlet of her left shoulder last visit.  She is diabetic and I did elevate her blood glucose.  She is also now been through physical therapy for her left shoulder.  Her pain is actually getting worse and she has had a couple bad nights she states without left shoulder.  Her left shoulder is definitely located clinically.  And shows positive Neer and Hawkins signs and pain in the subacromial outlet with weakness.  Her previous x-rays of left shoulder were normal.  This point a MRI of the left shoulder is warranted to assess the rotator cuff and other structures given amount of pain she is having with weakness.  This is more crucial for her because again she is someone who mobilizes in a wheelchair and uses her shoulders for her mobility and transport.  She has had previous right shoulder arthroscopy surgery that did well.  Given the fact that now she has failed rest, anti-inflammatories, time, steroid injections and now physical therapy for her left shoulder and having continued weakness and pain, this MRI is essential.  We will see her back in follow-up after this.  She agrees with the treatment plan.

## 2021-06-08 ENCOUNTER — Other Ambulatory Visit: Payer: Self-pay

## 2021-06-08 DIAGNOSIS — Z96612 Presence of left artificial shoulder joint: Secondary | ICD-10-CM

## 2021-06-09 ENCOUNTER — Ambulatory Visit (INDEPENDENT_AMBULATORY_CARE_PROVIDER_SITE_OTHER): Payer: Medicare HMO | Admitting: Physical Therapy

## 2021-06-09 ENCOUNTER — Other Ambulatory Visit: Payer: Self-pay

## 2021-06-09 DIAGNOSIS — M6281 Muscle weakness (generalized): Secondary | ICD-10-CM | POA: Diagnosis not present

## 2021-06-09 DIAGNOSIS — M25511 Pain in right shoulder: Secondary | ICD-10-CM

## 2021-06-09 DIAGNOSIS — G8929 Other chronic pain: Secondary | ICD-10-CM | POA: Diagnosis not present

## 2021-06-09 DIAGNOSIS — M25512 Pain in left shoulder: Secondary | ICD-10-CM | POA: Diagnosis not present

## 2021-06-09 NOTE — Patient Instructions (Signed)
Access Code: [facility] URL: [email or web address] Date: 06/09/2021 Prepared by: [place]  Exercises Seated Shoulder Flexion Towel Slide at Table Top - 2 x daily - 7 x weekly - 1 sets - 10 reps Seated Shoulder Abduction Towel Slide at Table Top - 2 x daily - 7 x weekly - 1 sets - 10 reps Seated Shoulder External Rotation - 2 x daily - 7 x weekly - 1 sets - 10 reps Seated Shoulder Flexion AAROM with Dowel - 2 x daily - 7 x weekly - 1 sets - 10 reps Seated Shoulder W - 2 x daily - 7 x weekly - 1 sets - 5 reps - 3 seconds hold Seated Shoulder Circles AAROM with Dowel into Wall - 2 x daily - 7 x weekly - 1 sets - 10 reps Seated Shoulder Flexion Extension AAROM with Dowel into Wall - 2 x daily - 7 x weekly - 1 sets - 10 reps

## 2021-06-09 NOTE — Therapy (Addendum)
[place], [place], [address] Phone: [phone]   Fax:  [phone]  Physical Therapy Treatment and Discharge Summary  Patient Details  Name: [name] Rice MRN: [id number] Date of Birth: [birth year] Referring Provider (PT): Jean Rosenthal, MD   Encounter Date: 06/09/2021   PT End of Session - 06/09/21 9935     Visit Number 2    Number of Visits 12    Date for PT Re-Evaluation 07/25/19    Authorization Type humana medicare    PT Start Time 1437    PT Stop Time 7017    PT Time Calculation (min) 34 min    Activity Tolerance Patient tolerated treatment well    Behavior During Therapy [facility] for tasks assessed/performed             Past Medical History:  Diagnosis Date   Anxiety    Arthritis    Asthma    no PFTs   Carpal tunnel syndrome, bilateral    Cervical dystonia    COPD (chronic obstructive pulmonary disease) ([place])    no PFTs   Depression    Diabetes mellitus    Diarrhea    Diverticulitis    Diverticulosis    Dizziness    Gastroparesis    GERD (gastroesophageal reflux disease)    History of colonic polyps    hyperplastic   History of kidney stones    History of seborrheic dermatitis    face,ears   HLD (hyperlipidemia)    Hypertension    Intussusception ([place])    Lisfranc's sprain, left, initial encounter 10/06/2017   Lumbar pain 2011   per x-ray - Multilevel spondylosis   Morbid obesity ([place])    Nephrolithiasis    OSA on CPAP 2008   Pneumonia    Recurrent boils    "SLUSTER BOILS" ON ABD AND LABIA   Sleep apnea    USES C-PAP   Torn rotator cuff RT SHOULDER   Tremor [place]    Past Surgical History:  Procedure Laterality Date   CARPAL TUNNEL RELEASE Left 2006   ELBOW SURGERY Left    HAND SURGERY Right RIGHT   KNEE ARTHROSCOPY Left    LUMBAR LAMINECTOMY     MASS EXCISION  06/28/2012   Procedure: EXCISION MASS;  Surgeon: Harl Bowie,  MD;  Location: [place];  Service: General;  Laterality: Left;  excision left upper quadrant abdominal wall mass, Benign Lipoma   NASAL SINUS SURGERY     PILONIDAL CYST EXCISION  [id number]   SUBACROMIAL DECOMPRESSION Right    TUBAL LIGATION      There were no vitals filed for this visit.   Subjective Assessment - 06/09/21 1441     Subjective Pt reports she has had a hard time sleeping.  She told the MD and is getting MRI of Lt shoulder. She reports she has been completing HEP multiple times per day.    Pertinent History h/o 3 surgeries R foot, anxiety, arthritis, CTS bilat, COPD, cervical dytonia, depression, diabetes,    Patient Stated Goals reduce pain and walk (PT discussed this referral for shoulders-- will look at walking if another order re: foot or back is sent).    Currently in Pain? Yes    Pain Score 5     Pain Location Shoulder   with movement; no pain at rest.   Pain Orientation Left;Right   L>R   Pain Descriptors / Indicators Aching;Sore    Aggravating Factors  trouble sleeping    Pain Relieving Factors warm water.                [facility] PT Assessment - 06/09/21 0001       Assessment   Medical Diagnosis bilateral chronic shoulder pain    Referring Provider (PT) Jean Rosenthal, MD    Onset Date/Surgical Date 05/10/21    Hand Dominance Right             [facility] Adult PT Treatment/Exercise - 06/09/21 0001       Shoulder Exercises: Seated   Retraction Both;5 reps    Retraction Limitations Then seated Ls for retraction/ER x 10;  W's x 3 sec x 5 reps.    Row Both;5 reps   sliding cane up lap and abdomen   Row Limitations cues for retraction, and avoid elevation of scap.    Flexion AAROM;5 reps   cane, overhead press   Other Seated Exercises shoulder rolls x 5 CW/CCW; pt complains this bothers her neck.  Lateral neck flexion x 3 reps each direction (very limited)     Shoulder Exercises: Stretch   Table Stretch - Flexion 5 reps;10 seconds    Table Stretch  -Flexion Limitations rolling WC backwards from elevated table.    Table Stretch - Abduction 5 reps;10 seconds    Star Gazer Stretch 2 reps;10 seconds   seated position, hands on top of head, elbows pulling back.   Other Shoulder Stretches seated cane flex/ext (like shifting gears) x 5 reps each arm, then circumduction, with bottom of cane staying static on floor x 5 reps CW/CCW each arm.    Other Shoulder Stretches seated Rt/Lt shoulder ext stretch holding door frame (therapist positioned WC) x 15 sec x 2.               PT Long Term Goals - 05/31/21 1603       PT LONG TERM GOAL #1   Title The patient will be indep with HEP.    Time 6    Period Weeks    Target Date 07/12/21      PT LONG TERM GOAL #2   Title The patient will reduce L shoulder pain from 5/10 to < or equal to 2/10    Time 6    Period Weeks    Target Date 07/12/21      PT LONG TERM GOAL #3   Title The patient will improve AROM left shoulder to 90 degrees in sitting.    Time 6    Period Weeks    Target Date 07/12/21      PT LONG TERM GOAL #4   Title The patient will be able to stand at a counter and reach to shoulder height shelf to improve ADLs.    Time 6    Period Weeks    Target Date 07/12/21                   Plan - 06/09/21 1521     Clinical Impression Statement Pt has difficulty with supine position in clinic, so exercises were kept at [facility] level, to pt tolerance.  Pt reported some increase in discomfort in Lt elbow with overhead press (when extended) of cane, and Lt shoulder row with cane.  Tactile cues used for improved scap retraction. Pt otherwise tolerated exercises well.    Personal Factors and Comorbidities Comorbidity 3+;Time since onset of injury/illness/exacerbation    Comorbidities obesity, HTN, chronic pain, DM, depression, anxiety, COPD, asthma,  arthritis    Examination-Activity Limitations Bed Mobility;Lift;Reach Overhead;Carry;Dressing;Hygiene/Grooming    Examination-Participation  Restrictions Meal Prep;Cleaning    Stability/Clinical Decision Making Evolving/Moderate complexity    Rehab Potential Good    PT Frequency 2x / week    PT Duration 6 weeks    PT Treatment/Interventions ADLs/Self Care Home Management;Therapeutic exercise;Therapeutic activities;Neuromuscular re-education;Patient/family education;Taping;Manual techniques;Cryotherapy;Moist Heat;Passive range of motion    PT Next Visit Plan plan to continue to progress AAROM in seated.  UE strengthening, may be able to stand with counter support to work on functional reaching.  *Didn't put modalities in (except heat/ice) due to patient report of increased calcification under the skin*    PT Home Exercise Plan Access Code: [email or web address]    Consulted and Agree with Plan of Care Patient             Patient will benefit from skilled therapeutic intervention in order to improve the following deficits and impairments:  Pain, Hypomobility, Impaired flexibility, Postural dysfunction, Decreased strength, Decreased range of motion, Increased fascial restricitons, Impaired tone, Impaired UE functional use, Decreased activity tolerance  Visit Diagnosis: Chronic left shoulder pain  Chronic right shoulder pain  Muscle weakness (generalized)   PHYSICAL THERAPY DISCHARGE SUMMARY  Visits from Start of Care: 2  Current functional level related to goals / functional outcomes: *patient did not return-- was seeing MD for further assessment.   Remaining deficits: See eval for last known patient deficits   Education / Equipment: HEP   Patient agrees to discharge. Patient goals were not met. Patient is being discharged due to not returning since the last visit.     Problem List Patient Active Problem List   Diagnosis Date Noted   Rosacea 04/02/2021   Chronic right shoulder pain 04/02/2021   Problems with swallowing 03/22/2021   Allergic conjunctivitis of both eyes 03/22/2021   Left ventricular hypertrophy 07/23/2020    Moderate episode of recurrent major depressive disorder ([place]) 07/14/2020   Paresthesia 06/30/2020   Weakness 06/30/2020   Chronic fatigue 06/30/2020   Bilateral rotator cuff syndrome 01/24/2020   DDD (degenerative disc disease), cervical 12/27/2019   Trigger finger, right middle finger 12/24/2019   Trigger index finger of right hand 12/24/2019   Chronic kidney disease (CKD), stage III (moderate) ([place]) 10/04/2019   Acute right ankle pain 09/25/2019   OAB (overactive bladder) 09/25/2019   Type 2 diabetes mellitus with diabetic neuropathy, with long-term current use of insulin ([place]) 06/20/2019   Dizziness 06/20/2019   Bilateral carpal tunnel syndrome post carpal tunnel release on the left 01/24/2019   Pressure injury of right foot, unstageable ([place]) 09/25/2018   Calcification and ossification of muscle 07/13/2018   Drug-induced constipation 05/23/2018   Restrictive lung disease 04/12/2018   Mixed hyperlipidemia 04/12/2018   Vitamin D insufficiency 04/12/2018   Hordeolum externum of right lower eyelid 02/16/2018   Hot flashes 02/16/2018   Ear itching 01/17/2018   No energy 01/17/2018   Vasomotor rhinitis 11/28/2017   Lisfranc's dislocation, right, initial encounter 10/06/2017   Freiberg's disease, right 10/06/2017   Neck pain 07/11/2017   Dystonia 09/21/2016   Acute reaction to stress 03/28/2016   Post-menopausal 03/16/2016   Intertrigo 03/15/2016   Left knee pain 03/15/2016   Morbid obesity due to excess calories ([place]) 02/16/2016   Unstable balance 02/16/2016   Chronic pain syndrome 12/15/2015   Non-proliferative diabetic retinopathy, right eye ([place]) 11/23/2015   Diarrhea 01/12/2015   Ear pain 12/09/2014   Type 2 diabetes, controlled, with peripheral neuropathy ([place]) 12/09/2014  Gastroparesis due to secondary diabetes ([place]) 09/16/2014   Diabetic peripheral neuropathy ([place]) 05/30/2014   Diverticulitis 11/27/2013   Hepatic steatosis 09/26/2013   SOB (shortness of breath) on  exertion 03/31/2013   Intestinal bacterial overgrowth 09/20/2012   Lipoma of abdominal wall 05/01/2012   Iron deficiency anemia 03/29/2012   History of allergic rhinitis 09/07/2011   Preventative health care 08/16/2011   Insomnia 02/06/2011   OSA on CPAP    Allergic sinusitis 05/27/2009   Depression with anxiety 01/28/2008   Acute bilateral low back pain 11/27/2007   Type II or unspecified type diabetes mellitus with neurological manifestations, not stated as uncontrolled(250.60) 08/08/2006   Hyperlipidemia LDL goal <70 08/08/2006   Hypertension associated with diabetes ([place]) 08/08/2006   GERD 08/08/2006    WEAVER,CHRISTINA, PT  Kerin Perna, PTA 06/09/21 4:19 PM   [facility] [place], [place], [address] Phone: [phone]   Fax:  [phone]  Name: [name] Rice MRN: [id number] Date of Birth: March 26, 1956

## 2021-06-14 ENCOUNTER — Encounter: Payer: Medicare HMO | Admitting: Rehabilitative and Restorative Service Providers"

## 2021-06-15 ENCOUNTER — Encounter: Payer: Self-pay | Admitting: Physician Assistant

## 2021-06-15 DIAGNOSIS — Z794 Long term (current) use of insulin: Secondary | ICD-10-CM

## 2021-06-15 DIAGNOSIS — K219 Gastro-esophageal reflux disease without esophagitis: Secondary | ICD-10-CM

## 2021-06-15 DIAGNOSIS — G894 Chronic pain syndrome: Secondary | ICD-10-CM

## 2021-06-15 DIAGNOSIS — N3281 Overactive bladder: Secondary | ICD-10-CM

## 2021-06-15 DIAGNOSIS — R0602 Shortness of breath: Secondary | ICD-10-CM

## 2021-06-15 DIAGNOSIS — F331 Major depressive disorder, recurrent, moderate: Secondary | ICD-10-CM

## 2021-06-15 DIAGNOSIS — E1140 Type 2 diabetes mellitus with diabetic neuropathy, unspecified: Secondary | ICD-10-CM

## 2021-06-16 ENCOUNTER — Other Ambulatory Visit: Payer: Self-pay | Admitting: Physician Assistant

## 2021-06-16 ENCOUNTER — Other Ambulatory Visit: Payer: Self-pay

## 2021-06-16 DIAGNOSIS — Z794 Long term (current) use of insulin: Secondary | ICD-10-CM

## 2021-06-16 DIAGNOSIS — E1142 Type 2 diabetes mellitus with diabetic polyneuropathy: Secondary | ICD-10-CM

## 2021-06-16 DIAGNOSIS — I10 Essential (primary) hypertension: Secondary | ICD-10-CM

## 2021-06-16 DIAGNOSIS — G894 Chronic pain syndrome: Secondary | ICD-10-CM

## 2021-06-16 DIAGNOSIS — L304 Erythema intertrigo: Secondary | ICD-10-CM

## 2021-06-16 DIAGNOSIS — E118 Type 2 diabetes mellitus with unspecified complications: Secondary | ICD-10-CM

## 2021-06-16 MED ORDER — BD SWAB SINGLE USE REGULAR PADS: 1.0000 | MEDICATED_PAD | Freq: Four times a day (QID) | 1 refills | Status: AC | PRN

## 2021-06-16 MED ORDER — GABAPENTIN 800 MG PO TABS: 800.0000 mg | ORAL_TABLET | Freq: Three times a day (TID) | ORAL | 0 refills | Status: DC

## 2021-06-16 MED ORDER — FAMOTIDINE 20 MG PO TABS
20.0000 mg | ORAL_TABLET | Freq: Two times a day (BID) | ORAL | 0 refills | Status: DC
Start: 2021-06-16 — End: 2021-08-16

## 2021-06-16 MED ORDER — MIRABEGRON ER 25 MG PO TB24: 25.0000 mg | ORAL_TABLET | Freq: Every day | ORAL | 0 refills | Status: DC

## 2021-06-16 MED ORDER — LAMOTRIGINE 100 MG PO TABS: 100.0000 mg | ORAL_TABLET | Freq: Every day | ORAL | 0 refills | Status: DC

## 2021-06-16 MED ORDER — HYDROXYZINE HCL 50 MG PO TABS: 50.0000 mg | ORAL_TABLET | Freq: Three times a day (TID) | ORAL | 0 refills | Status: DC | PRN

## 2021-06-16 MED ORDER — TRUE METRIX LEVEL 1 LOW VI SOLN: 1.0000 | Freq: Every day | 0 refills | Status: DC | PRN

## 2021-06-16 MED ORDER — MECLIZINE HCL 25 MG PO TABS: 25.0000 mg | ORAL_TABLET | Freq: Three times a day (TID) | ORAL | 0 refills | Status: DC | PRN

## 2021-06-16 MED ORDER — FUROSEMIDE 20 MG PO TABS: ORAL_TABLET | ORAL | 0 refills | Status: DC

## 2021-06-16 MED ORDER — METHOCARBAMOL 500 MG PO TABS: 500.0000 mg | ORAL_TABLET | Freq: Three times a day (TID) | ORAL | 0 refills | Status: DC

## 2021-06-16 MED ORDER — TRUE METRIX BLOOD GLUCOSE TEST VI STRP: ORAL_STRIP | 1 refills | Status: DC

## 2021-06-16 MED ORDER — TRUEDRAW LANCING DEVICE MISC: 1.0000 | Freq: Four times a day (QID) | 0 refills | Status: DC | PRN

## 2021-06-16 MED ORDER — TRUEPLUS LANCETS 33G MISC: 1.0000 | Freq: Four times a day (QID) | 3 refills | Status: DC | PRN

## 2021-06-16 NOTE — Telephone Encounter (Signed)
FYI

## 2021-06-16 NOTE — Telephone Encounter (Signed)
Received fax from pharmacy requesting refill for meclizine '25mg'$ .  This medication is not on the current medication list.  Please review for appropriateness of request.  Charyl Bigger, CMA

## 2021-06-16 NOTE — Telephone Encounter (Signed)
Haven't received any requests, but went ahead and sent prescriptions that looked like they were due.   Hydrocodone not on current medication list.   She does have a current pain contract from [place] for Norco 10/325 mg #90 1 TID PRN for 30 days.   Last written 12/11/2020 #90 no refills Last appt 03/22/2021  Please advise

## 2021-06-17 ENCOUNTER — Encounter: Payer: Medicare HMO | Admitting: Physical Therapy

## 2021-06-17 ENCOUNTER — Telehealth: Payer: Self-pay | Admitting: Physical Therapy

## 2021-06-17 MED ORDER — HYDROCODONE-ACETAMINOPHEN 10-325 MG PO TABS: 1.0000 | ORAL_TABLET | Freq: Three times a day (TID) | ORAL | 0 refills | Status: DC | PRN

## 2021-06-17 NOTE — Addendum Note (Signed)
Addended by: Donella Stade on: 06/17/2021 04:33 PM   Modules accepted: Orders

## 2021-06-17 NOTE — Telephone Encounter (Signed)
Patient did not show for PT appointment.  Called and left HIPAA compliant voice message to request call back to confirm next upcoming appt.  [phone].  Kerin Perna, PTA 06/17/21 3:07 PM

## 2021-06-23 ENCOUNTER — Encounter: Payer: Medicare HMO | Admitting: Physical Therapy

## 2021-06-25 ENCOUNTER — Encounter: Payer: Medicare HMO | Admitting: Rehabilitative and Restorative Service Providers"

## 2021-06-28 ENCOUNTER — Ambulatory Visit (INDEPENDENT_AMBULATORY_CARE_PROVIDER_SITE_OTHER): Payer: Medicare HMO | Admitting: Physician Assistant

## 2021-06-28 ENCOUNTER — Encounter: Payer: Self-pay | Admitting: Physician Assistant

## 2021-06-28 VITALS — BP 133/64 | HR 80 | Ht 63.0 in | Wt 257.0 lb

## 2021-06-28 DIAGNOSIS — Z1329 Encounter for screening for other suspected endocrine disorder: Secondary | ICD-10-CM | POA: Diagnosis not present

## 2021-06-28 DIAGNOSIS — F331 Major depressive disorder, recurrent, moderate: Secondary | ICD-10-CM

## 2021-06-28 DIAGNOSIS — Z794 Long term (current) use of insulin: Secondary | ICD-10-CM | POA: Diagnosis not present

## 2021-06-28 DIAGNOSIS — I10 Essential (primary) hypertension: Secondary | ICD-10-CM | POA: Diagnosis not present

## 2021-06-28 DIAGNOSIS — E118 Type 2 diabetes mellitus with unspecified complications: Secondary | ICD-10-CM

## 2021-06-28 DIAGNOSIS — Z23 Encounter for immunization: Secondary | ICD-10-CM

## 2021-06-28 DIAGNOSIS — Z13220 Encounter for screening for lipoid disorders: Secondary | ICD-10-CM | POA: Diagnosis not present

## 2021-06-28 DIAGNOSIS — E1142 Type 2 diabetes mellitus with diabetic polyneuropathy: Secondary | ICD-10-CM

## 2021-06-28 DIAGNOSIS — Z79899 Other long term (current) drug therapy: Secondary | ICD-10-CM | POA: Diagnosis not present

## 2021-06-28 DIAGNOSIS — E1140 Type 2 diabetes mellitus with diabetic neuropathy, unspecified: Secondary | ICD-10-CM

## 2021-06-28 LAB — POCT GLYCOSYLATED HEMOGLOBIN (HGB A1C): Hemoglobin A1C: 7.1 % — AB (ref 4.0–5.6)

## 2021-06-28 MED ORDER — OZEMPIC (0.25 OR 0.5 MG/DOSE) 2 MG/1.5ML ~~LOC~~ SOPN: 0.5000 mg | PEN_INJECTOR | SUBCUTANEOUS | 2 refills | Status: DC

## 2021-06-28 MED ORDER — PROPRANOLOL HCL 20 MG PO TABS: ORAL_TABLET | ORAL | 1 refills | Status: DC

## 2021-06-28 MED ORDER — PIOGLITAZONE HCL 30 MG PO TABS: ORAL_TABLET | ORAL | 0 refills | Status: DC

## 2021-06-28 MED ORDER — PRAVASTATIN SODIUM 10 MG PO TABS: 10.0000 mg | ORAL_TABLET | Freq: Every day | ORAL | 3 refills | Status: DC

## 2021-06-28 NOTE — Progress Notes (Signed)
Subjective:    Patient ID: [name] Rice, female    DOB: [birth year], 65 y.o.   MRN: [id number]  HPI Pt is a 65 yo obese female with T2DM, HLD, HTN, OSA, DM gastroparesis and neuropathy who presents to the clinic for 3 month follow up.   She has been able to tolerate ozempic and lost 19lbs. She is feeling so much better. Not checking sugars. No hypoglycemic events. No open sores or wounds. She is trying to eat less and make better low sugar choices. No CP, palpitations, headaches or vision changes. She admits she is not taking tresbia. If any maybe once a week. She is not exercising.   She is not taking lamictal and feels like mood is controlled. No SI/HC.   Marland Kitchen. Active Ambulatory Problems    Diagnosis Date Noted   Type II or unspecified type diabetes mellitus with neurological manifestations, not stated as uncontrolled(250.60) 08/08/2006   Hyperlipidemia LDL goal <70 08/08/2006   Depression with anxiety 01/28/2008   Hypertension associated with diabetes ([place]) 08/08/2006   Allergic sinusitis 05/27/2009   GERD 08/08/2006   Acute bilateral low back pain 11/27/2007   OSA on CPAP    Insomnia 02/06/2011   Preventative health care 08/16/2011   History of allergic rhinitis 09/07/2011   Iron deficiency anemia 03/29/2012   Lipoma of abdominal wall 05/01/2012   Intestinal bacterial overgrowth 09/20/2012   SOB (shortness of breath) on exertion 03/31/2013   Hepatic steatosis 09/26/2013   Diverticulitis 11/27/2013   Diabetic peripheral neuropathy ([place]) 05/30/2014   Gastroparesis due to secondary diabetes ([place]) 09/16/2014   Ear pain 12/09/2014   Type 2 diabetes, controlled, with peripheral neuropathy ([place]) 12/09/2014   Diarrhea 01/12/2015   Non-proliferative diabetic retinopathy, right eye ([place]) 11/23/2015   Chronic pain syndrome 12/15/2015   Morbid obesity due to excess calories ([place]) 02/16/2016   Unstable balance 02/16/2016   Intertrigo 03/15/2016   Left knee pain 03/15/2016    Post-menopausal 03/16/2016   Acute reaction to stress 03/28/2016   Dystonia 09/21/2016   Neck pain 07/11/2017   Lisfranc's dislocation, right, initial encounter 10/06/2017   Freiberg's disease, right 10/06/2017   Vasomotor rhinitis 11/28/2017   Ear itching 01/17/2018   No energy 01/17/2018   Hordeolum externum of right lower eyelid 02/16/2018   Hot flashes 02/16/2018   Restrictive lung disease 04/12/2018   Mixed hyperlipidemia 04/12/2018   Vitamin D insufficiency 04/12/2018   Drug-induced constipation 05/23/2018   Calcification and ossification of muscle 07/13/2018   Pressure injury of right foot, unstageable ([place]) 09/25/2018   Bilateral carpal tunnel syndrome post carpal tunnel release on the left 01/24/2019   Type 2 diabetes mellitus with diabetic neuropathy, with long-term current use of insulin ([place]) 06/20/2019   Dizziness 06/20/2019   Acute right ankle pain 09/25/2019   OAB (overactive bladder) 09/25/2019   Chronic kidney disease (CKD), stage III (moderate) ([place]) 10/04/2019   Trigger finger, right middle finger 12/24/2019   Trigger index finger of right hand 12/24/2019   DDD (degenerative disc disease), cervical 12/27/2019   Bilateral rotator cuff syndrome 01/24/2020   Paresthesia 06/30/2020   Weakness 06/30/2020   Chronic fatigue 06/30/2020   Moderate episode of recurrent major depressive disorder ([place]) 07/14/2020   Left ventricular hypertrophy 07/23/2020   Problems with swallowing 03/22/2021   Allergic conjunctivitis of both eyes 03/22/2021   Rosacea 04/02/2021   Chronic right shoulder pain 04/02/2021   Resolved Ambulatory Problems    Diagnosis Date Noted   IRON DEFICIENCY 01/30/2007  DEPRESSION 08/08/2006   TORTICOLLIS, SPASMODIC 02/13/2007   ALLERGIC RHINITIS 12/25/2006   ASTHMA 01/28/2008   COPD 08/08/2006   DIVERTICULOSIS, COLON 02/12/2003   DERMATITIS, SEBORRHEIC 01/30/2007   BPPV (benign paroxysmal positional vertigo), unspecified laterality 07/14/2009    GOUT, HX OF 08/08/2006   PERSONAL HX COLONIC POLYPS 09/16/2008   NEPHROLITHIASIS, HX OF 01/28/2008   Calf pain 11/29/2010   Tremor, essential 02/06/2011   Seborrheic dermatitis, unspecified 04/06/2011   Finger pain 04/06/2011   Shoulder pain, right 08/16/2011   Left leg pain 04/10/2012   Right ear pain 05/01/2012   Seborrheic dermatitis 05/01/2012   Hypoglycemia 05/01/2012   Vaginal candidiasis 08/22/2012   Sigmoid diverticulitis 09/15/2012   Acute-on-chronic kidney disease Stage II 09/15/2012   Cyst, breast 03/01/2013   Lactic acidosis 03/31/2013   Acute renal insufficiency 03/31/2013   Abdominal pain, left lower quadrant 04/29/2013   Gout 05/01/2013   COPD (chronic obstructive pulmonary disease) with chronic bronchitis ([place]) 12/09/2014   Right shoulder pain 02/16/2016   Fall 03/16/2016   Foot fracture, right, closed, initial encounter 08/23/2017   Chronic pain of both shoulders 12/24/2019   Past Medical History:  Diagnosis Date   Anxiety    Arthritis    Asthma    Carpal tunnel syndrome, bilateral    Cervical dystonia    COPD (chronic obstructive pulmonary disease) ([place])    Depression    Diabetes mellitus    Diverticulosis    Gastroparesis    GERD (gastroesophageal reflux disease)    History of colonic polyps    History of kidney stones    History of seborrheic dermatitis    HLD (hyperlipidemia)    Hypertension    Intussusception ([place])    Lisfranc's sprain, left, initial encounter 10/06/2017   Lumbar pain 2011   Morbid obesity ([place])    Nephrolithiasis    Pneumonia    Recurrent boils    Sleep apnea    Torn rotator cuff RT SHOULDER   Tremor CCENTRAL NERVOUS SYSTEM       Review of Systems See HPI.     Objective:   Physical Exam Vitals reviewed.  Constitutional:      Appearance: Normal appearance. She is obese.  HENT:     Head: Normocephalic.  Neck:     Vascular: No carotid bruit.  Cardiovascular:     Rate and Rhythm: Normal rate and regular rhythm.      Pulses: Normal pulses.     Heart sounds: Murmur heard.  Pulmonary:     Effort: Pulmonary effort is normal.     Breath sounds: Normal breath sounds.  Musculoskeletal:     Right lower leg: No edema.     Left lower leg: No edema.  Lymphadenopathy:     Cervical: Cervical adenopathy present.  Neurological:     General: No focal deficit present.     Mental Status: She is alert and oriented to person, place, and time.  Psychiatric:        Mood and Affect: Mood normal.         .. Results for orders placed or performed in visit on 06/28/21  POCT glycosylated hemoglobin (Hb A1C)  Result Value Ref Range   Hemoglobin A1C 7.1 (A) 4.0 - 5.6 %   HbA1c POC (<> result, manual entry)     HbA1c, POC (prediabetic range)     HbA1c, POC (controlled diabetic range)     *Note: Due to a large number of results and/or encounters for the requested time  period, some results have not been displayed. A complete set of results can be found in Results Review.    Assessment & Plan:  Marland KitchenMarland KitchenRocelyn was seen today for diabetes.  Diagnoses and all orders for this visit:  Type 2 diabetes mellitus with diabetic neuropathy, with long-term current use of insulin (HCC) -     POCT glycosylated hemoglobin (Hb A1C) -     COMPLETE METABOLIC PANEL WITH GFR  Essential hypertension -     COMPLETE METABOLIC PANEL WITH GFR -     propranolol (INDERAL) 20 MG tablet; TAKE 1/2 TABLET BY MOUTH TWICE DAILY  Lipid screening -     Lipid Panel w/reflex Direct LDL  Medication management -     TSH -     Lipid Panel w/reflex Direct LDL -     COMPLETE METABOLIC PANEL WITH GFR -     CBC with Differential/Platelet  Thyroid disorder screen -     TSH  Flu vaccine need -     Flu Vaccine QUAD High Dose(Fluad)  Moderate episode of recurrent major depressive disorder (HCC)  Type 2 diabetes, controlled, with peripheral neuropathy (HCC) -     pravastatin (PRAVACHOL) 10 MG tablet; Take 1 tablet (10 mg total) by mouth  daily.  Diabetic peripheral neuropathy (HCC) -     pioglitazone (ACTOS) 30 MG tablet; TAKE 1 TABLET EVERY DAY  Type 2 diabetes mellitus with complication, with long-term current use of insulin (HCC) -     pioglitazone (ACTOS) 30 MG tablet; TAKE 1 TABLET EVERY DAY  Other orders -     Semaglutide,0.25 or 0.'5MG'$ /DOS, (OZEMPIC, 0.25 OR 0.5 MG/DOSE,) 2 MG/1.5ML SOPN; Inject 0.5 mg into the skin once a week.  A1C is almost under 7.  Increased ozempic to .'5mg'$  weekly.  Stay on Actos.  Not taking tresbia regularly. Try stopping. And see what A1C is in 3 months.  Did not tolerate SGLT-2 and metformin.  On ARB. BP to goal.  On statin. LDL ordered today.  NEEDS eye exam! Pt states she will get when she has time.  Covid vaccine x2. Declines booster.  Flu shot given today.  Pneumonia shots UTD.  Follow up in 3 months.   Get screening labs.

## 2021-06-29 LAB — CBC WITH DIFFERENTIAL/PLATELET
Absolute Monocytes: 469 cells/uL (ref 200–950)
Basophils Absolute: 43 cells/uL (ref 0–200)
Basophils Relative: 0.6 %
Eosinophils Absolute: 241 cells/uL (ref 15–500)
Eosinophils Relative: 3.4 %
HCT: 42.9 % (ref 35.0–45.0)
Hemoglobin: 14.3 g/dL (ref 11.7–15.5)
Lymphs Abs: 1818 cells/uL (ref 850–3900)
MCH: 30.2 pg (ref 27.0–33.0)
MCHC: 33.3 g/dL (ref 32.0–36.0)
MCV: 90.5 fL (ref 80.0–100.0)
MPV: 9.7 fL (ref 7.5–12.5)
Monocytes Relative: 6.6 %
Neutro Abs: 4530 cells/uL (ref 1500–7800)
Neutrophils Relative %: 63.8 %
Platelets: 238 10*3/uL (ref 140–400)
RBC: 4.74 10*6/uL (ref 3.80–5.10)
RDW: 13.3 % (ref 11.0–15.0)
Total Lymphocyte: 25.6 %
WBC: 7.1 10*3/uL (ref 3.8–10.8)

## 2021-06-29 LAB — COMPLETE METABOLIC PANEL WITH GFR
AG Ratio: 1.3 (calc) (ref 1.0–2.5)
ALT: 10 U/L (ref 6–29)
AST: 13 U/L (ref 10–35)
Albumin: 3.9 g/dL (ref 3.6–5.1)
Alkaline phosphatase (APISO): 87 U/L (ref 37–153)
BUN/Creatinine Ratio: 15 (calc) (ref 6–22)
BUN: 17 mg/dL (ref 7–25)
CO2: 30 mmol/L (ref 20–32)
Calcium: 9.2 mg/dL (ref 8.6–10.4)
Chloride: 98 mmol/L (ref 98–110)
Creat: 1.11 mg/dL — ABNORMAL HIGH (ref 0.50–1.05)
Globulin: 2.9 g/dL (calc) (ref 1.9–3.7)
Glucose, Bld: 206 mg/dL — ABNORMAL HIGH (ref 65–99)
Potassium: 3.6 mmol/L (ref 3.5–5.3)
Sodium: 139 mmol/L (ref 135–146)
Total Bilirubin: 0.7 mg/dL (ref 0.2–1.2)
Total Protein: 6.8 g/dL (ref 6.1–8.1)
eGFR: 54 mL/min/{1.73_m2} — ABNORMAL LOW (ref >=60)

## 2021-06-29 LAB — LIPID PANEL W/REFLEX DIRECT LDL
Cholesterol: 168 mg/dL (ref <200)
HDL: 44 mg/dL — ABNORMAL LOW (ref >=50)
LDL Cholesterol (Calc): 100 mg/dL (calc) — ABNORMAL HIGH
Non-HDL Cholesterol (Calc): 124 mg/dL (calc) (ref <130)
Total CHOL/HDL Ratio: 3.8 (calc) (ref <5.0)
Triglycerides: 143 mg/dL (ref <150)

## 2021-06-29 LAB — TSH: TSH: 3.6 mIU/L (ref 0.40–4.50)

## 2021-06-29 NOTE — Progress Notes (Signed)
[name] Rice,  LDL not to goal. Goal is under 70. It has worsened since last year. Are you taking cholesterol medication daily? I think would be a great idea to increase pravachol to at lest '40mg'$  daily.   GFR down some. Please make sure not taking anti-inflammatories, staying hydrated, and taking losaartan.   Random sugar detected not good at 200.   Thyroid normal range but upper limits.

## 2021-07-01 ENCOUNTER — Ambulatory Visit
Admission: RE | Admit: 2021-07-01 | Discharge: 2021-07-01 | Disposition: A | Discharge status: Home / Self Care | Payer: Medicare HMO | Source: Ambulatory Visit | Attending: Orthopaedic Surgery | Admitting: Orthopaedic Surgery

## 2021-07-01 ENCOUNTER — Other Ambulatory Visit: Payer: Self-pay

## 2021-07-01 DIAGNOSIS — Z96612 Presence of left artificial shoulder joint: Secondary | ICD-10-CM

## 2021-07-01 DIAGNOSIS — M67812 Other specified disorders of synovium, left shoulder: Secondary | ICD-10-CM | POA: Diagnosis not present

## 2021-07-06 ENCOUNTER — Telehealth: Payer: Self-pay | Admitting: Neurology

## 2021-07-06 NOTE — Telephone Encounter (Signed)
Patient left vm stating she saw her results on mychart but did not understand them. She wanted a call back to discuss. [phone].

## 2021-07-07 NOTE — Telephone Encounter (Signed)
Tried to call back with no answer  

## 2021-07-12 ENCOUNTER — Other Ambulatory Visit: Payer: Self-pay | Admitting: Physician Assistant

## 2021-07-12 DIAGNOSIS — G4733 Obstructive sleep apnea (adult) (pediatric): Secondary | ICD-10-CM

## 2021-07-14 ENCOUNTER — Ambulatory Visit: Payer: Medicare HMO | Admitting: Orthopaedic Surgery

## 2021-07-22 ENCOUNTER — Telehealth: Payer: Self-pay

## 2021-07-22 MED ORDER — COVID-19 AT-HOME TEST VI KIT: 1.0000 | PACK | 0 refills | Status: DC | PRN

## 2021-07-22 NOTE — Telephone Encounter (Signed)
Pt called requesting RX for the OTC COVID tests be sent to [facility] in [place].  Pt also requested medications to treat COVID.  Advised pt that she will need virtual visit for treatment.   Pt expressed understanding and states that she does not need a visit at this time as she is starting to feel better.  RX for COVID tests sent to pharmacy.  Charyl Bigger, CMA

## 2021-08-02 ENCOUNTER — Ambulatory Visit: Payer: Medicare HMO | Admitting: Orthopaedic Surgery

## 2021-08-03 ENCOUNTER — Ambulatory Visit: Payer: Medicare HMO | Admitting: Orthopaedic Surgery

## 2021-08-09 ENCOUNTER — Other Ambulatory Visit: Payer: Self-pay | Admitting: Neurology

## 2021-08-09 MED ORDER — TRUE METRIX METER DEVI: 1.0000 | Freq: Every day | 0 refills | Status: DC

## 2021-08-11 ENCOUNTER — Other Ambulatory Visit: Payer: Self-pay

## 2021-08-11 MED ORDER — TRUE METRIX METER DEVI: 1.0000 | Freq: Every day | 0 refills | Status: DC

## 2021-08-15 ENCOUNTER — Other Ambulatory Visit: Payer: Self-pay | Admitting: Physician Assistant

## 2021-08-15 DIAGNOSIS — R0602 Shortness of breath: Secondary | ICD-10-CM

## 2021-08-15 DIAGNOSIS — E1343 Other specified diabetes mellitus with diabetic autonomic (poly)neuropathy: Secondary | ICD-10-CM

## 2021-08-15 DIAGNOSIS — K219 Gastro-esophageal reflux disease without esophagitis: Secondary | ICD-10-CM

## 2021-08-16 ENCOUNTER — Other Ambulatory Visit: Payer: Self-pay

## 2021-08-16 ENCOUNTER — Ambulatory Visit: Payer: Medicare HMO | Admitting: Orthopaedic Surgery

## 2021-08-16 ENCOUNTER — Encounter: Payer: Self-pay | Admitting: Orthopaedic Surgery

## 2021-08-16 DIAGNOSIS — M25512 Pain in left shoulder: Secondary | ICD-10-CM

## 2021-08-16 DIAGNOSIS — M7542 Impingement syndrome of left shoulder: Secondary | ICD-10-CM | POA: Diagnosis not present

## 2021-08-16 DIAGNOSIS — M75112 Incomplete rotator cuff tear or rupture of left shoulder, not specified as traumatic: Secondary | ICD-10-CM

## 2021-08-16 DIAGNOSIS — G8929 Other chronic pain: Secondary | ICD-10-CM | POA: Diagnosis not present

## 2021-08-16 NOTE — Progress Notes (Signed)
The patient comes in for follow-up after having a MRI of her left shoulder.  She is someone who is mainly wheelchair-bound and does not ambulate using her shoulders quite a bit.  Since I saw her last in order the MRI she said the pain has gotten somewhat better.  It took a while for the steroid injection to help with her shoulder.  She is a diabetic and her last hemoglobin A1c was 7.1.  She does have a remote history of a right shoulder arthroscopy done elsewhere in 2016 that did help her.  Again right now she said that she is feeling much better overall with her shoulder and she does have a lot of significant comorbidities and her things going on so she would like to hold off on a surgical intervention.  The MRI of her left shoulder shows some bursal surface tearing of the rotator cuff that is only partial-thickness.  There is only mild tendinosis and a mild fluid collection in the subacromial outlet.  There is some thinning of the articular cartilage on the glenohumeral joint.  There is otherwise no worrisome findings and no muscle atrophy.  Since she is doing better right now I would hold off on any other intervention.  My next step would be considering a repeat steroid injection prior to considering any type of other surgery and she agrees with this treatment plan.  Follow-up can be as needed and if things worsen she will let us know and come see [place] again.

## 2021-08-26 DIAGNOSIS — M48061 Spinal stenosis, lumbar region without neurogenic claudication: Secondary | ICD-10-CM | POA: Diagnosis not present

## 2021-08-26 DIAGNOSIS — M5136 Other intervertebral disc degeneration, lumbar region: Secondary | ICD-10-CM | POA: Diagnosis not present

## 2021-08-26 DIAGNOSIS — G894 Chronic pain syndrome: Secondary | ICD-10-CM | POA: Diagnosis not present

## 2021-08-26 DIAGNOSIS — M4726 Other spondylosis with radiculopathy, lumbar region: Secondary | ICD-10-CM | POA: Diagnosis not present

## 2021-08-31 ENCOUNTER — Other Ambulatory Visit: Payer: Self-pay | Admitting: Physician Assistant

## 2021-08-31 DIAGNOSIS — G894 Chronic pain syndrome: Secondary | ICD-10-CM

## 2021-08-31 DIAGNOSIS — H1013 Acute atopic conjunctivitis, bilateral: Secondary | ICD-10-CM

## 2021-09-07 ENCOUNTER — Other Ambulatory Visit: Payer: Self-pay

## 2021-09-07 DIAGNOSIS — G894 Chronic pain syndrome: Secondary | ICD-10-CM

## 2021-09-08 MED ORDER — HYDROCODONE-ACETAMINOPHEN 10-325 MG PO TABS: 1.0000 | ORAL_TABLET | Freq: Three times a day (TID) | ORAL | 0 refills | Status: DC | PRN

## 2021-09-15 ENCOUNTER — Telehealth: Payer: Self-pay | Admitting: Physician Assistant

## 2021-09-15 NOTE — Telephone Encounter (Signed)
Pt called.  She states she is changing over to [facility].

## 2021-09-20 ENCOUNTER — Other Ambulatory Visit: Payer: Self-pay

## 2021-09-20 ENCOUNTER — Telehealth: Payer: Self-pay | Admitting: Physician Assistant

## 2021-09-20 DIAGNOSIS — G894 Chronic pain syndrome: Secondary | ICD-10-CM

## 2021-09-20 NOTE — Telephone Encounter (Signed)
I think she switched PCP to [facility]. Can we confirm?

## 2021-09-20 NOTE — Telephone Encounter (Signed)
11/23 norco was sent???

## 2021-09-21 MED ORDER — HYDROCODONE-ACETAMINOPHEN 10-325 MG PO TABS: 1.0000 | ORAL_TABLET | Freq: Three times a day (TID) | ORAL | 0 refills | Status: AC | PRN

## 2021-09-21 NOTE — Telephone Encounter (Signed)
Yes, but pt states that she has been taking more of the Norco because of increased pain.  Charyl Bigger, CMA

## 2021-09-21 NOTE — Telephone Encounter (Signed)
LVM for pt to call to discuss.  T. Carnell Beavers, CMA  

## 2021-09-21 NOTE — Telephone Encounter (Signed)
Per patient in more pain and taking more regularly.  Early for refills. Sent one last rx and ok to fill early but patient needs to follow up with provider treating pain for more medication or new PCP.

## 2021-09-21 NOTE — Telephone Encounter (Signed)
Pt informed.  Pt expressed understanding and is agreeable.  T. Mariel Gaudin, CMA  

## 2021-09-21 NOTE — Telephone Encounter (Signed)
Provider treating her pain needs to take over pain rx prescription after this one that I send today.

## 2021-09-29 ENCOUNTER — Encounter: Payer: Self-pay | Admitting: Physician Assistant

## 2021-09-29 ENCOUNTER — Ambulatory Visit: Payer: Medicare HMO | Admitting: Physician Assistant

## 2021-09-29 DIAGNOSIS — M25512 Pain in left shoulder: Secondary | ICD-10-CM

## 2021-09-29 DIAGNOSIS — M7542 Impingement syndrome of left shoulder: Secondary | ICD-10-CM | POA: Diagnosis not present

## 2021-09-29 DIAGNOSIS — R6889 Other general symptoms and signs: Secondary | ICD-10-CM | POA: Diagnosis not present

## 2021-09-29 DIAGNOSIS — G8929 Other chronic pain: Secondary | ICD-10-CM

## 2021-09-29 MED ORDER — METHYLPREDNISOLONE ACETATE 40 MG/ML IJ SUSP
40.0000 mg | INTRAMUSCULAR | Status: AC | PRN
Start: 2021-09-29 — End: 2021-09-29
  Administered 2021-09-29: 15:00:00 40 mg via INTRA_ARTICULAR

## 2021-09-29 MED ORDER — LIDOCAINE HCL 1 % IJ SOLN
3.0000 mL | INTRAMUSCULAR | Status: AC | PRN
Start: 2021-09-29 — End: 2021-09-29
  Administered 2021-09-29: 15:00:00 3 mL

## 2021-09-29 NOTE — Progress Notes (Signed)
° °  Procedure Note  Patient: [name] Rice             Date of Birth: May 02, 1956           MRN: [id number]             Visit Date: 09/29/2021 HPI: Ms. Johny Chess comes in today complaining of left shoulder pain.  She last had a cortisone injection 05/10/2021 she states that helped.  She is also going to formal therapy with for 2 visits for her shoulder and then discontinue due to low back pain.  She is due for epidural steroid injections 10/05/2021 lumbar spine.  She is diabetic she is states her last hemoglobin A1c was 7.1.  No fevers chills.  No new injury to the left shoulder.  Physical exam: Left shoulder she has positive impingement on the left side.  Forward flexion actively to have a proximal 160 degrees passively and bring it 180 degrees but this is uncomfortable.  Fluid internal and external rotation.  Procedures: Visit Diagnoses:  1. Chronic left shoulder pain   2. Impingement syndrome of left shoulder     Large Joint Inj: L subacromial bursa on 09/29/2021 2:41 PM Indications: pain Details: 22 G 1.5 in needle, superior approach  Arthrogram: No  Medications: 3 mL lidocaine 1 %; 40 mg methylPREDNISolone acetate 40 MG/ML Outcome: tolerated well, no immediate complications Procedure, treatment alternatives, risks and benefits explained, specific risks discussed. Consent was given by the patient. Immediately prior to procedure a time out was called to verify the correct patient, procedure, equipment, support staff and site/side marked as required. Patient was prepped and draped in the usual sterile fashion.    Plan: Discussed with patient that she needs to monitor her glucose levels closely over the next 24 to 48 hours.  Also discussed with her the need to continue to work on range of motion shoulder so she does not develop the frozen shoulder.  We will see her back on an as-needed basis.  Postinjection and can easily move her arm above her head without any significant discomfort.

## 2021-10-04 DIAGNOSIS — E139 Other specified diabetes mellitus without complications: Secondary | ICD-10-CM | POA: Diagnosis not present

## 2021-10-20 ENCOUNTER — Ambulatory Visit: Payer: Medicare HMO

## 2021-10-27 DIAGNOSIS — M4726 Other spondylosis with radiculopathy, lumbar region: Secondary | ICD-10-CM | POA: Diagnosis not present

## 2021-11-01 DIAGNOSIS — N1831 Chronic kidney disease, stage 3a: Secondary | ICD-10-CM | POA: Diagnosis not present

## 2021-11-01 DIAGNOSIS — E1142 Type 2 diabetes mellitus with diabetic polyneuropathy: Secondary | ICD-10-CM | POA: Diagnosis not present

## 2021-11-01 DIAGNOSIS — J449 Chronic obstructive pulmonary disease, unspecified: Secondary | ICD-10-CM | POA: Diagnosis not present

## 2021-11-01 DIAGNOSIS — F331 Major depressive disorder, recurrent, moderate: Secondary | ICD-10-CM | POA: Diagnosis not present

## 2021-11-01 DIAGNOSIS — G894 Chronic pain syndrome: Secondary | ICD-10-CM | POA: Diagnosis not present

## 2021-11-01 DIAGNOSIS — Z13820 Encounter for screening for osteoporosis: Secondary | ICD-10-CM | POA: Diagnosis not present

## 2021-11-01 DIAGNOSIS — Z1231 Encounter for screening mammogram for malignant neoplasm of breast: Secondary | ICD-10-CM | POA: Diagnosis not present

## 2021-11-01 DIAGNOSIS — Z5181 Encounter for therapeutic drug level monitoring: Secondary | ICD-10-CM | POA: Diagnosis not present

## 2021-11-01 DIAGNOSIS — Z780 Asymptomatic menopausal state: Secondary | ICD-10-CM | POA: Diagnosis not present

## 2021-11-01 LAB — CBC AND DIFFERENTIAL
HCT: 40 (ref 36–46)
Hemoglobin: 13.6 (ref 12.0–16.0)
Platelets: 239 (ref 150–399)
WBC: 8.9

## 2021-11-01 LAB — COMPREHENSIVE METABOLIC PANEL
Albumin: 3.7 (ref 3.5–5.0)
Calcium: 8.9 (ref 8.7–10.7)
Globulin: 2.9
eGFR: 56

## 2021-11-01 LAB — BASIC METABOLIC PANEL
BUN: 19 (ref 4–21)
Chloride: 103 (ref 99–108)
Creatinine: 1.1 (ref 0.5–1.1)
Potassium: 4.2 (ref 3.4–5.3)
Sodium: 144 (ref 137–147)

## 2021-11-01 LAB — HEPATIC FUNCTION PANEL
Alkaline Phosphatase: 127 — AB (ref 25–125)
Bilirubin, Total: 0.5

## 2021-11-01 LAB — VITAMIN D 25 HYDROXY (VIT D DEFICIENCY, FRACTURES): Vit D, 25-Hydroxy: 54.1

## 2021-11-01 LAB — HEMOGLOBIN A1C: Hemoglobin A1C: 7.8

## 2021-11-01 LAB — CBC: RBC: 4.46 (ref 3.87–5.11)

## 2021-11-18 DIAGNOSIS — H3581 Retinal edema: Secondary | ICD-10-CM | POA: Diagnosis not present

## 2021-11-18 DIAGNOSIS — H43821 Vitreomacular adhesion, right eye: Secondary | ICD-10-CM | POA: Diagnosis not present

## 2021-11-18 DIAGNOSIS — E119 Type 2 diabetes mellitus without complications: Secondary | ICD-10-CM | POA: Diagnosis not present

## 2021-11-18 DIAGNOSIS — H43393 Other vitreous opacities, bilateral: Secondary | ICD-10-CM | POA: Diagnosis not present

## 2021-11-23 ENCOUNTER — Encounter (INDEPENDENT_AMBULATORY_CARE_PROVIDER_SITE_OTHER): Payer: Medicare Other | Admitting: Ophthalmology

## 2021-11-23 ENCOUNTER — Encounter (INDEPENDENT_AMBULATORY_CARE_PROVIDER_SITE_OTHER): Payer: Self-pay

## 2021-11-23 DIAGNOSIS — E119 Type 2 diabetes mellitus without complications: Secondary | ICD-10-CM | POA: Diagnosis not present

## 2021-11-23 DIAGNOSIS — H34831 Tributary (branch) retinal vein occlusion, right eye, with macular edema: Secondary | ICD-10-CM | POA: Diagnosis not present

## 2021-12-03 ENCOUNTER — Telehealth: Payer: Self-pay

## 2021-12-03 NOTE — Telephone Encounter (Signed)
Please advise 

## 2021-12-03 NOTE — Telephone Encounter (Signed)
Patient called and stated she switched over her care to [facility] but no longer want to be seen there. Can back reestablish care with [facility]. Please advise.

## 2021-12-06 NOTE — Telephone Encounter (Signed)
Okay to schedule with [name]

## 2021-12-14 DIAGNOSIS — H34831 Tributary (branch) retinal vein occlusion, right eye, with macular edema: Secondary | ICD-10-CM | POA: Diagnosis not present

## 2021-12-20 DIAGNOSIS — Z1231 Encounter for screening mammogram for malignant neoplasm of breast: Secondary | ICD-10-CM | POA: Diagnosis not present

## 2021-12-20 DIAGNOSIS — M85851 Other specified disorders of bone density and structure, right thigh: Secondary | ICD-10-CM | POA: Diagnosis not present

## 2021-12-20 DIAGNOSIS — Z780 Asymptomatic menopausal state: Secondary | ICD-10-CM | POA: Diagnosis not present

## 2021-12-20 DIAGNOSIS — M8589 Other specified disorders of bone density and structure, multiple sites: Secondary | ICD-10-CM | POA: Diagnosis not present

## 2021-12-20 DIAGNOSIS — Z13820 Encounter for screening for osteoporosis: Secondary | ICD-10-CM | POA: Diagnosis not present

## 2021-12-20 LAB — HM DEXA SCAN

## 2021-12-21 ENCOUNTER — Encounter: Payer: Medicare Other | Admitting: Physician Assistant

## 2021-12-22 ENCOUNTER — Ambulatory Visit: Payer: Medicare Other | Admitting: Orthopaedic Surgery

## 2021-12-22 NOTE — Progress Notes (Signed)
Pt canceled

## 2021-12-27 ENCOUNTER — Other Ambulatory Visit: Payer: Self-pay

## 2021-12-27 ENCOUNTER — Ambulatory Visit (INDEPENDENT_AMBULATORY_CARE_PROVIDER_SITE_OTHER): Payer: Medicare Other | Admitting: Physician Assistant

## 2021-12-27 ENCOUNTER — Encounter: Payer: Self-pay | Admitting: Physician Assistant

## 2021-12-27 VITALS — BP 156/62 | HR 94

## 2021-12-27 DIAGNOSIS — Z794 Long term (current) use of insulin: Secondary | ICD-10-CM | POA: Diagnosis not present

## 2021-12-27 DIAGNOSIS — I152 Hypertension secondary to endocrine disorders: Secondary | ICD-10-CM

## 2021-12-27 DIAGNOSIS — E1140 Type 2 diabetes mellitus with diabetic neuropathy, unspecified: Secondary | ICD-10-CM

## 2021-12-27 DIAGNOSIS — Z1211 Encounter for screening for malignant neoplasm of colon: Secondary | ICD-10-CM

## 2021-12-27 DIAGNOSIS — N3281 Overactive bladder: Secondary | ICD-10-CM

## 2021-12-27 DIAGNOSIS — J301 Allergic rhinitis due to pollen: Secondary | ICD-10-CM | POA: Diagnosis not present

## 2021-12-27 DIAGNOSIS — E1159 Type 2 diabetes mellitus with other circulatory complications: Secondary | ICD-10-CM

## 2021-12-27 DIAGNOSIS — E1142 Type 2 diabetes mellitus with diabetic polyneuropathy: Secondary | ICD-10-CM | POA: Diagnosis not present

## 2021-12-27 DIAGNOSIS — H349 Unspecified retinal vascular occlusion: Secondary | ICD-10-CM

## 2021-12-27 DIAGNOSIS — G894 Chronic pain syndrome: Secondary | ICD-10-CM | POA: Diagnosis not present

## 2021-12-27 DIAGNOSIS — I10 Essential (primary) hypertension: Secondary | ICD-10-CM

## 2021-12-27 DIAGNOSIS — E118 Type 2 diabetes mellitus with unspecified complications: Secondary | ICD-10-CM

## 2021-12-27 DIAGNOSIS — M8589 Other specified disorders of bone density and structure, multiple sites: Secondary | ICD-10-CM

## 2021-12-27 DIAGNOSIS — K219 Gastro-esophageal reflux disease without esophagitis: Secondary | ICD-10-CM

## 2021-12-27 MED ORDER — PRAVASTATIN SODIUM 10 MG PO TABS: 10.0000 mg | ORAL_TABLET | Freq: Every day | ORAL | 3 refills | Status: DC

## 2021-12-27 MED ORDER — OZEMPIC (0.25 OR 0.5 MG/DOSE) 2 MG/1.5ML ~~LOC~~ SOPN: 0.5000 mg | PEN_INJECTOR | SUBCUTANEOUS | 1 refills | Status: DC

## 2021-12-27 MED ORDER — METHOCARBAMOL 500 MG PO TABS: 500.0000 mg | ORAL_TABLET | Freq: Three times a day (TID) | ORAL | 1 refills | Status: DC

## 2021-12-27 MED ORDER — DULOXETINE HCL 60 MG PO CPEP: 120.0000 mg | ORAL_CAPSULE | Freq: Every day | ORAL | 3 refills | Status: DC

## 2021-12-27 MED ORDER — HYDROCODONE-ACETAMINOPHEN 10-325 MG PO TABS: 1.0000 | ORAL_TABLET | Freq: Two times a day (BID) | ORAL | 0 refills | Status: DC

## 2021-12-27 MED ORDER — MIRABEGRON ER 25 MG PO TB24: 25.0000 mg | ORAL_TABLET | Freq: Every day | ORAL | 3 refills | Status: DC

## 2021-12-27 MED ORDER — PANTOPRAZOLE SODIUM 40 MG PO TBEC: 40.0000 mg | DELAYED_RELEASE_TABLET | Freq: Every day | ORAL | 3 refills | Status: DC

## 2021-12-27 MED ORDER — LEVOCETIRIZINE DIHYDROCHLORIDE 5 MG PO TABS: 5.0000 mg | ORAL_TABLET | Freq: Every evening | ORAL | 3 refills | Status: DC

## 2021-12-27 MED ORDER — GABAPENTIN 800 MG PO TABS: 800.0000 mg | ORAL_TABLET | Freq: Three times a day (TID) | ORAL | 1 refills | Status: DC

## 2021-12-27 MED ORDER — PROPRANOLOL HCL 20 MG PO TABS: ORAL_TABLET | ORAL | 1 refills | Status: DC

## 2021-12-27 MED ORDER — PIOGLITAZONE HCL 30 MG PO TABS: ORAL_TABLET | ORAL | 3 refills | Status: DC

## 2021-12-27 NOTE — Progress Notes (Signed)
ees ?

## 2021-12-27 NOTE — Patient Instructions (Addendum)
Will send approval for prolia.  ?Increase propranolol to 1 tablet twice a day.  ?Referral for colonoscopy. ? ? ?Tardive Dyskinesia ?Tardive dyskinesia is a disorder that causes uncontrollable body movements. It occurs in some people who are taking certain medicines to treat a mental illness (neuroleptic medicine) or have taken this type of medicine in the past. These medicines block the effects of a specific brain chemical (dopamine). Sometimes, tardive dyskinesia starts months or years after someone took the medicine. Not everyone who takes a neuroleptic medicine will get tardive dyskinesia. ?What are the causes? ?This condition is caused by changes in your brain that are associated with taking a neuroleptic medicine. ?What increases the risk? ?If you are taking a neuroleptic medicine, your risk for tardive dyskinesia may be higher if you: ?Are taking an older type of neuroleptic medicine. ?Have been taking the medicine for a long time at a high dose. ?Are a woman past the age of menopause. ?Are older than 60. ?Have a history of alcohol or drug abuse. ?What are the signs or symptoms? ?Abnormal, uncontrollable movements are the main symptom of tardive dyskinesia. These types of movements may include: ?Grimacing. ?Sticking out or twisting your tongue. ?Making chewing or sucking sounds. ?Making grunting or sighing noises. ?Blinking your eyes. ?Twisting, swaying, or thrusting your body. ?Foot tapping or finger waving. ?Rapid movements of your arms or legs. ?How is this diagnosed? ?Your health care provider may suspect that you have tardive dyskinesia if: ?You have been taking neuroleptic medicines. ?You have abnormal movements that you cannot control. ?If you are taking a medicine that can cause tardive dyskinesia, your health care provider may screen you for early signs of the condition. This may include: ?Observing your body movements. ?Using a specific rating scale called the Abnormal Involuntary Movement Scale  (AIMS). ?You may also have tests to rule out other conditions that cause abnormal body movements, including: ?Parkinson's disease. ?Huntington's disease. ?Stroke. ?How is this treated? ?The best treatment for tardive dyskinesia is to lower the dose of your medicine or to switch to a different medicine at the first sign of abnormal and uncontrolled movements. There is no cure for long-term (chronic) tardive dyskinesia. Some medicines may help control the movements. These include: ?Clozapine, a medicine used to treat mental illness (antipsychotic). ?Some muscle relaxants. ?Some anti-seizure medicines. ?Some medicines used to treat high blood pressure. ?Some tranquilizers (sedatives). ?Follow these instructions at home: ?  ?Take over-the-counter and prescription medicines only as told by your health care provider. Do not stop or start taking any medicines without talking to your health care provider first. ?Do not abuse drugs or alcohol. ?Keep all follow-up visits as told by your health care provider. This is important. ?Contact a health care provider if: ?You are unable to eat or drink. ?You have had a fall. ?Your symptoms get worse. ?Summary ?Tardive dyskinesia is a disorder that causes uncontrollable body movements. These may include grimacing, sticking out or twisting your tongue, blinking your eyes, or rapid movements of your arms or legs. ?The condition occurs in some people who are taking certain medicines to treat a mental illness (neuroleptic medicine) or have taken this type of medicine in the past. ?The best treatment for tardive dyskinesia is to lower the dose of your medicine or to switch to a different medicine at the first sign of abnormal and uncontrolled movements. ?There is no cure for long-term (chronic) tardive dyskinesia, but some medicines may help control the movements. ?This information is not intended to  replace advice given to you by your health care provider. Make sure you discuss any  questions you have with your health care provider. ?Document Revised: 06/10/2021 Document Reviewed: 10/26/2017 ?Elsevier Patient Education ? [place]. ? ?

## 2021-12-27 NOTE — Progress Notes (Incomplete)
° °  Subjective:    Patient ID: [name] Rice, female    DOB: 1956-09-06, 66 y.o.   MRN: [id number]  HPI  Bone desnit and mammog.   Blood clot right   Review of Systems     Objective:   Physical Exam        Assessment & Plan:

## 2021-12-28 LAB — HEMOGLOBIN A1C
Hgb A1c MFr Bld: 6.4 % of total Hgb — ABNORMAL HIGH (ref <5.7)
Mean Plasma Glucose: 137 mg/dL
eAG (mmol/L): 7.6 mmol/L

## 2021-12-28 NOTE — Progress Notes (Signed)
A1C controlled at 6.4! GREAT job.

## 2021-12-30 ENCOUNTER — Encounter: Payer: Self-pay | Admitting: Neurology

## 2021-12-31 ENCOUNTER — Encounter: Payer: Self-pay | Admitting: Physician Assistant

## 2021-12-31 DIAGNOSIS — H349 Unspecified retinal vascular occlusion: Secondary | ICD-10-CM | POA: Insufficient documentation

## 2021-12-31 MED ORDER — LOSARTAN POTASSIUM 25 MG PO TABS: 25.0000 mg | ORAL_TABLET | Freq: Every day | ORAL | 0 refills | Status: DC

## 2022-01-03 ENCOUNTER — Encounter: Payer: Self-pay | Admitting: Neurology

## 2022-01-03 LAB — HM DIABETES EYE EXAM

## 2022-01-04 ENCOUNTER — Other Ambulatory Visit: Payer: Self-pay

## 2022-01-04 MED ORDER — OZEMPIC (0.25 OR 0.5 MG/DOSE) 2 MG/3ML ~~LOC~~ SOPN: 0.5000 mg | PEN_INJECTOR | SUBCUTANEOUS | 1 refills | Status: DC

## 2022-01-05 ENCOUNTER — Other Ambulatory Visit: Payer: Self-pay | Admitting: Neurology

## 2022-01-05 ENCOUNTER — Encounter: Payer: Self-pay | Admitting: Physician Assistant

## 2022-01-12 DIAGNOSIS — E119 Type 2 diabetes mellitus without complications: Secondary | ICD-10-CM | POA: Diagnosis not present

## 2022-01-12 DIAGNOSIS — H43812 Vitreous degeneration, left eye: Secondary | ICD-10-CM | POA: Diagnosis not present

## 2022-01-12 DIAGNOSIS — H34831 Tributary (branch) retinal vein occlusion, right eye, with macular edema: Secondary | ICD-10-CM | POA: Diagnosis not present

## 2022-01-14 ENCOUNTER — Encounter: Payer: Self-pay | Admitting: Physician Assistant

## 2022-01-17 ENCOUNTER — Other Ambulatory Visit: Payer: Self-pay | Admitting: *Deleted

## 2022-01-17 DIAGNOSIS — E559 Vitamin D deficiency, unspecified: Secondary | ICD-10-CM

## 2022-01-17 MED ORDER — VITAMIN D (ERGOCALCIFEROL) 1.25 MG (50000 UNIT) PO CAPS: ORAL_CAPSULE | ORAL | 1 refills | Status: DC

## 2022-01-17 NOTE — Telephone Encounter (Signed)
[name] I spoke to Ms. Boller please see below, ? ?I will forward your request for this refill to [facility]. It looks like when this was checked in January it was  ?Vit D, 25-Hydroxy 54.1   ?If this is approved I will have her send this over to [facility] in [place] per your request. ? ?Taleya Whitcher B. CMA ?

## 2022-01-20 DIAGNOSIS — I152 Hypertension secondary to endocrine disorders: Secondary | ICD-10-CM | POA: Diagnosis not present

## 2022-01-20 DIAGNOSIS — M4726 Other spondylosis with radiculopathy, lumbar region: Secondary | ICD-10-CM | POA: Diagnosis not present

## 2022-01-20 DIAGNOSIS — R200 Anesthesia of skin: Secondary | ICD-10-CM | POA: Diagnosis not present

## 2022-01-20 DIAGNOSIS — E1159 Type 2 diabetes mellitus with other circulatory complications: Secondary | ICD-10-CM | POA: Diagnosis not present

## 2022-01-24 ENCOUNTER — Encounter: Payer: Self-pay | Admitting: Orthopaedic Surgery

## 2022-01-26 ENCOUNTER — Ambulatory Visit: Payer: Medicare Other | Admitting: Orthopaedic Surgery

## 2022-02-01 DIAGNOSIS — M5117 Intervertebral disc disorders with radiculopathy, lumbosacral region: Secondary | ICD-10-CM | POA: Diagnosis not present

## 2022-02-01 DIAGNOSIS — M48061 Spinal stenosis, lumbar region without neurogenic claudication: Secondary | ICD-10-CM | POA: Diagnosis not present

## 2022-02-01 DIAGNOSIS — M4726 Other spondylosis with radiculopathy, lumbar region: Secondary | ICD-10-CM | POA: Diagnosis not present

## 2022-02-01 DIAGNOSIS — M5116 Intervertebral disc disorders with radiculopathy, lumbar region: Secondary | ICD-10-CM | POA: Diagnosis not present

## 2022-02-03 ENCOUNTER — Other Ambulatory Visit: Payer: Self-pay | Admitting: Neurology

## 2022-02-03 MED ORDER — FLUTICASONE PROPIONATE 50 MCG/ACT NA SUSP: 2.0000 | Freq: Every day | NASAL | 1 refills | Status: DC

## 2022-02-03 MED ORDER — MECLIZINE HCL 25 MG PO TABS: 25.0000 mg | ORAL_TABLET | Freq: Three times a day (TID) | ORAL | 0 refills | Status: AC | PRN

## 2022-02-03 NOTE — Progress Notes (Signed)
Patient left vm asking for refills. RX sent.  ?

## 2022-02-04 MED ORDER — FLUTICASONE PROPIONATE 50 MCG/ACT NA SUSP: 2.0000 | Freq: Every day | NASAL | 1 refills | Status: DC

## 2022-02-04 NOTE — Addendum Note (Signed)
Addended byAnnamaria Helling on: 02/04/2022 02:38 PM ? ? Modules accepted: Orders ? ?

## 2022-02-09 DIAGNOSIS — E113293 Type 2 diabetes mellitus with mild nonproliferative diabetic retinopathy without macular edema, bilateral: Secondary | ICD-10-CM | POA: Diagnosis not present

## 2022-02-09 DIAGNOSIS — H43812 Vitreous degeneration, left eye: Secondary | ICD-10-CM | POA: Diagnosis not present

## 2022-02-09 DIAGNOSIS — H34831 Tributary (branch) retinal vein occlusion, right eye, with macular edema: Secondary | ICD-10-CM | POA: Diagnosis not present

## 2022-02-21 ENCOUNTER — Other Ambulatory Visit: Payer: Self-pay

## 2022-02-21 DIAGNOSIS — G894 Chronic pain syndrome: Secondary | ICD-10-CM

## 2022-02-21 MED ORDER — HYDROCODONE-ACETAMINOPHEN 10-325 MG PO TABS: 1.0000 | ORAL_TABLET | Freq: Two times a day (BID) | ORAL | 0 refills | Status: DC

## 2022-02-28 ENCOUNTER — Ambulatory Visit: Payer: Medicare Other | Admitting: Physician Assistant

## 2022-03-03 DIAGNOSIS — E119 Type 2 diabetes mellitus without complications: Secondary | ICD-10-CM | POA: Diagnosis not present

## 2022-03-03 DIAGNOSIS — M5030 Other cervical disc degeneration, unspecified cervical region: Secondary | ICD-10-CM | POA: Diagnosis not present

## 2022-03-03 DIAGNOSIS — M4722 Other spondylosis with radiculopathy, cervical region: Secondary | ICD-10-CM | POA: Diagnosis not present

## 2022-03-03 DIAGNOSIS — M4802 Spinal stenosis, cervical region: Secondary | ICD-10-CM | POA: Diagnosis not present

## 2022-03-07 ENCOUNTER — Other Ambulatory Visit: Payer: Self-pay | Admitting: Neurology

## 2022-03-07 DIAGNOSIS — L304 Erythema intertrigo: Secondary | ICD-10-CM

## 2022-03-07 DIAGNOSIS — H1013 Acute atopic conjunctivitis, bilateral: Secondary | ICD-10-CM

## 2022-03-07 DIAGNOSIS — K219 Gastro-esophageal reflux disease without esophagitis: Secondary | ICD-10-CM

## 2022-03-07 DIAGNOSIS — J301 Allergic rhinitis due to pollen: Secondary | ICD-10-CM

## 2022-03-07 MED ORDER — LEVOCETIRIZINE DIHYDROCHLORIDE 5 MG PO TABS: 5.0000 mg | ORAL_TABLET | Freq: Every evening | ORAL | 1 refills | Status: DC

## 2022-03-07 MED ORDER — NYSTATIN 100000 UNIT/GM EX OINT: TOPICAL_OINTMENT | CUTANEOUS | 1 refills | Status: DC

## 2022-03-07 MED ORDER — MONTELUKAST SODIUM 10 MG PO TABS: 10.0000 mg | ORAL_TABLET | Freq: Every day | ORAL | 1 refills | Status: DC

## 2022-03-07 MED ORDER — FAMOTIDINE 20 MG PO TABS: 20.0000 mg | ORAL_TABLET | Freq: Two times a day (BID) | ORAL | 1 refills | Status: DC

## 2022-03-09 DIAGNOSIS — H43812 Vitreous degeneration, left eye: Secondary | ICD-10-CM | POA: Diagnosis not present

## 2022-03-09 DIAGNOSIS — H34831 Tributary (branch) retinal vein occlusion, right eye, with macular edema: Secondary | ICD-10-CM | POA: Diagnosis not present

## 2022-03-09 DIAGNOSIS — E113293 Type 2 diabetes mellitus with mild nonproliferative diabetic retinopathy without macular edema, bilateral: Secondary | ICD-10-CM | POA: Diagnosis not present

## 2022-03-22 DIAGNOSIS — G629 Polyneuropathy, unspecified: Secondary | ICD-10-CM | POA: Diagnosis not present

## 2022-04-01 ENCOUNTER — Ambulatory Visit: Payer: Medicare Other | Admitting: Physician Assistant

## 2022-04-04 ENCOUNTER — Ambulatory Visit: Payer: Medicare Other | Admitting: Physician Assistant

## 2022-04-08 DIAGNOSIS — I152 Hypertension secondary to endocrine disorders: Secondary | ICD-10-CM | POA: Diagnosis not present

## 2022-04-08 DIAGNOSIS — M4722 Other spondylosis with radiculopathy, cervical region: Secondary | ICD-10-CM | POA: Diagnosis not present

## 2022-04-08 DIAGNOSIS — E1159 Type 2 diabetes mellitus with other circulatory complications: Secondary | ICD-10-CM | POA: Diagnosis not present

## 2022-04-08 DIAGNOSIS — M4802 Spinal stenosis, cervical region: Secondary | ICD-10-CM | POA: Diagnosis not present

## 2022-04-12 ENCOUNTER — Encounter: Payer: Self-pay | Admitting: Physician Assistant

## 2022-04-12 ENCOUNTER — Ambulatory Visit (INDEPENDENT_AMBULATORY_CARE_PROVIDER_SITE_OTHER): Payer: Medicare Other | Admitting: Physician Assistant

## 2022-04-12 VITALS — BP 106/84 | HR 74 | Ht 63.0 in | Wt 261.0 lb

## 2022-04-12 DIAGNOSIS — E1142 Type 2 diabetes mellitus with diabetic polyneuropathy: Secondary | ICD-10-CM | POA: Diagnosis not present

## 2022-04-12 DIAGNOSIS — Z794 Long term (current) use of insulin: Secondary | ICD-10-CM | POA: Diagnosis not present

## 2022-04-12 DIAGNOSIS — G894 Chronic pain syndrome: Secondary | ICD-10-CM

## 2022-04-12 DIAGNOSIS — R0989 Other specified symptoms and signs involving the circulatory and respiratory systems: Secondary | ICD-10-CM

## 2022-04-12 DIAGNOSIS — E1140 Type 2 diabetes mellitus with diabetic neuropathy, unspecified: Secondary | ICD-10-CM

## 2022-04-12 DIAGNOSIS — I10 Essential (primary) hypertension: Secondary | ICD-10-CM | POA: Diagnosis not present

## 2022-04-12 DIAGNOSIS — E118 Type 2 diabetes mellitus with unspecified complications: Secondary | ICD-10-CM | POA: Diagnosis not present

## 2022-04-12 DIAGNOSIS — R252 Cramp and spasm: Secondary | ICD-10-CM

## 2022-04-12 DIAGNOSIS — E559 Vitamin D deficiency, unspecified: Secondary | ICD-10-CM

## 2022-04-12 DIAGNOSIS — E538 Deficiency of other specified B group vitamins: Secondary | ICD-10-CM

## 2022-04-12 DIAGNOSIS — R0602 Shortness of breath: Secondary | ICD-10-CM

## 2022-04-12 LAB — POCT GLYCOSYLATED HEMOGLOBIN (HGB A1C): Hemoglobin A1C: 6.8 % — AB (ref 4.0–5.6)

## 2022-04-12 MED ORDER — FUROSEMIDE 20 MG PO TABS
ORAL_TABLET | ORAL | 1 refills | Status: DC
Start: 2022-04-12 — End: 2022-06-03

## 2022-04-12 MED ORDER — VITAMIN D (ERGOCALCIFEROL) 1.25 MG (50000 UNIT) PO CAPS: ORAL_CAPSULE | ORAL | 1 refills | Status: DC

## 2022-04-12 MED ORDER — VITAMIN B-12 1000 MCG PO TABS: 1000.0000 ug | ORAL_TABLET | Freq: Every day | ORAL | 3 refills | Status: DC

## 2022-04-12 MED ORDER — PROPRANOLOL HCL 20 MG PO TABS: ORAL_TABLET | ORAL | 1 refills | Status: DC

## 2022-04-12 MED ORDER — PRAVASTATIN SODIUM 10 MG PO TABS: 10.0000 mg | ORAL_TABLET | Freq: Every day | ORAL | 3 refills | Status: DC

## 2022-04-12 MED ORDER — TRESIBA FLEXTOUCH 200 UNIT/ML ~~LOC~~ SOPN: PEN_INJECTOR | SUBCUTANEOUS | 1 refills | Status: DC

## 2022-04-12 MED ORDER — SEMAGLUTIDE (1 MG/DOSE) 4 MG/3ML ~~LOC~~ SOPN: 1.0000 mg | PEN_INJECTOR | SUBCUTANEOUS | 0 refills | Status: DC

## 2022-04-12 MED ORDER — HYDROCODONE-ACETAMINOPHEN 10-325 MG PO TABS: 1.0000 | ORAL_TABLET | Freq: Two times a day (BID) | ORAL | 0 refills | Status: DC

## 2022-04-12 MED ORDER — PIOGLITAZONE HCL 30 MG PO TABS: ORAL_TABLET | ORAL | 3 refills | Status: DC

## 2022-04-13 LAB — COMPLETE METABOLIC PANEL WITH GFR
AG Ratio: 1.4 (calc) (ref 1.0–2.5)
ALT: 10 U/L (ref 6–29)
AST: 14 U/L (ref 10–35)
Albumin: 3.8 g/dL (ref 3.6–5.1)
Alkaline phosphatase (APISO): 96 U/L (ref 37–153)
BUN/Creatinine Ratio: 15 (calc) (ref 6–22)
BUN: 22 mg/dL (ref 7–25)
CO2: 30 mmol/L (ref 20–32)
Calcium: 9.2 mg/dL (ref 8.6–10.4)
Chloride: 101 mmol/L (ref 98–110)
Creat: 1.47 mg/dL — ABNORMAL HIGH (ref 0.50–1.05)
Globulin: 2.8 g/dL (calc) (ref 1.9–3.7)
Glucose, Bld: 112 mg/dL — ABNORMAL HIGH (ref 65–99)
Potassium: 5 mmol/L (ref 3.5–5.3)
Sodium: 142 mmol/L (ref 135–146)
Total Bilirubin: 0.4 mg/dL (ref 0.2–1.2)
Total Protein: 6.6 g/dL (ref 6.1–8.1)
eGFR: 39 mL/min/{1.73_m2} — ABNORMAL LOW (ref >=60)

## 2022-04-13 NOTE — Progress Notes (Signed)
Kidney function declined a bit from last recheck. Make sure not taking any NSAIDs. Make sure staying hydrated. Recheck in 4 weeks to see if trending down.

## 2022-04-15 ENCOUNTER — Other Ambulatory Visit: Payer: Self-pay | Admitting: Neurology

## 2022-04-15 DIAGNOSIS — N289 Disorder of kidney and ureter, unspecified: Secondary | ICD-10-CM

## 2022-04-25 ENCOUNTER — Encounter: Payer: Self-pay | Admitting: Physician Assistant

## 2022-05-03 DIAGNOSIS — J449 Chronic obstructive pulmonary disease, unspecified: Secondary | ICD-10-CM | POA: Diagnosis not present

## 2022-05-03 DIAGNOSIS — G4733 Obstructive sleep apnea (adult) (pediatric): Secondary | ICD-10-CM | POA: Diagnosis not present

## 2022-05-04 DIAGNOSIS — H8110 Benign paroxysmal vertigo, unspecified ear: Secondary | ICD-10-CM | POA: Diagnosis not present

## 2022-05-04 DIAGNOSIS — R519 Headache, unspecified: Secondary | ICD-10-CM | POA: Diagnosis not present

## 2022-05-04 DIAGNOSIS — M542 Cervicalgia: Secondary | ICD-10-CM | POA: Diagnosis not present

## 2022-05-04 DIAGNOSIS — R29898 Other symptoms and signs involving the musculoskeletal system: Secondary | ICD-10-CM | POA: Diagnosis not present

## 2022-05-06 DIAGNOSIS — M50123 Cervical disc disorder at C6-C7 level with radiculopathy: Secondary | ICD-10-CM | POA: Diagnosis not present

## 2022-05-06 DIAGNOSIS — M5124 Other intervertebral disc displacement, thoracic region: Secondary | ICD-10-CM | POA: Diagnosis not present

## 2022-05-06 DIAGNOSIS — M4722 Other spondylosis with radiculopathy, cervical region: Secondary | ICD-10-CM | POA: Diagnosis not present

## 2022-05-06 DIAGNOSIS — R202 Paresthesia of skin: Secondary | ICD-10-CM | POA: Diagnosis not present

## 2022-05-06 DIAGNOSIS — M50122 Cervical disc disorder at C5-C6 level with radiculopathy: Secondary | ICD-10-CM | POA: Diagnosis not present

## 2022-05-16 ENCOUNTER — Other Ambulatory Visit: Payer: Self-pay

## 2022-05-16 DIAGNOSIS — E1343 Other specified diabetes mellitus with diabetic autonomic (poly)neuropathy: Secondary | ICD-10-CM

## 2022-05-16 DIAGNOSIS — G894 Chronic pain syndrome: Secondary | ICD-10-CM

## 2022-05-16 MED ORDER — HYDROCODONE-ACETAMINOPHEN 10-325 MG PO TABS
1.0000 | ORAL_TABLET | Freq: Two times a day (BID) | ORAL | 0 refills | Status: DC
Start: 2022-05-16 — End: 2022-06-30

## 2022-05-16 MED ORDER — METOCLOPRAMIDE HCL 10 MG PO TABS: 5.0000 mg | ORAL_TABLET | Freq: Three times a day (TID) | ORAL | 0 refills | Status: DC

## 2022-05-16 NOTE — Telephone Encounter (Signed)
Last visit 04/12/2022 last fill 04/12/2022.

## 2022-05-18 DIAGNOSIS — N289 Disorder of kidney and ureter, unspecified: Secondary | ICD-10-CM | POA: Diagnosis not present

## 2022-05-19 LAB — COMPLETE METABOLIC PANEL WITH GFR
AG Ratio: 1.3 (calc) (ref 1.0–2.5)
ALT: 8 U/L (ref 6–29)
AST: 15 U/L (ref 10–35)
Albumin: 3.6 g/dL (ref 3.6–5.1)
Alkaline phosphatase (APISO): 91 U/L (ref 37–153)
BUN/Creatinine Ratio: 17 (calc) (ref 6–22)
BUN: 20 mg/dL (ref 7–25)
CO2: 30 mmol/L (ref 20–32)
Calcium: 8.7 mg/dL (ref 8.6–10.4)
Chloride: 102 mmol/L (ref 98–110)
Creat: 1.21 mg/dL — ABNORMAL HIGH (ref 0.50–1.05)
Globulin: 2.7 g/dL (calc) (ref 1.9–3.7)
Glucose, Bld: 170 mg/dL — ABNORMAL HIGH (ref 65–99)
Potassium: 4 mmol/L (ref 3.5–5.3)
Sodium: 141 mmol/L (ref 135–146)
Total Bilirubin: 0.3 mg/dL (ref 0.2–1.2)
Total Protein: 6.3 g/dL (ref 6.1–8.1)
eGFR: 49 mL/min/{1.73_m2} — ABNORMAL LOW (ref >=60)

## 2022-05-23 ENCOUNTER — Other Ambulatory Visit: Payer: Self-pay | Admitting: Physician Assistant

## 2022-05-23 DIAGNOSIS — E1142 Type 2 diabetes mellitus with diabetic polyneuropathy: Secondary | ICD-10-CM

## 2022-05-23 DIAGNOSIS — I152 Hypertension secondary to endocrine disorders: Secondary | ICD-10-CM

## 2022-05-25 ENCOUNTER — Encounter: Payer: Self-pay | Admitting: Physician Assistant

## 2022-06-02 ENCOUNTER — Other Ambulatory Visit: Payer: Self-pay | Admitting: Physician Assistant

## 2022-06-02 DIAGNOSIS — H1013 Acute atopic conjunctivitis, bilateral: Secondary | ICD-10-CM

## 2022-06-02 DIAGNOSIS — J301 Allergic rhinitis due to pollen: Secondary | ICD-10-CM

## 2022-06-02 DIAGNOSIS — E1142 Type 2 diabetes mellitus with diabetic polyneuropathy: Secondary | ICD-10-CM

## 2022-06-03 ENCOUNTER — Other Ambulatory Visit: Payer: Self-pay | Admitting: Neurology

## 2022-06-03 DIAGNOSIS — G4733 Obstructive sleep apnea (adult) (pediatric): Secondary | ICD-10-CM | POA: Diagnosis not present

## 2022-06-03 DIAGNOSIS — J449 Chronic obstructive pulmonary disease, unspecified: Secondary | ICD-10-CM | POA: Diagnosis not present

## 2022-06-03 DIAGNOSIS — R0602 Shortness of breath: Secondary | ICD-10-CM

## 2022-06-03 MED ORDER — FUROSEMIDE 20 MG PO TABS: ORAL_TABLET | ORAL | 0 refills | Status: DC

## 2022-06-08 ENCOUNTER — Encounter: Payer: Self-pay | Admitting: General Practice

## 2022-06-09 DIAGNOSIS — E1159 Type 2 diabetes mellitus with other circulatory complications: Secondary | ICD-10-CM | POA: Diagnosis not present

## 2022-06-09 DIAGNOSIS — I152 Hypertension secondary to endocrine disorders: Secondary | ICD-10-CM | POA: Diagnosis not present

## 2022-06-09 DIAGNOSIS — G629 Polyneuropathy, unspecified: Secondary | ICD-10-CM | POA: Diagnosis not present

## 2022-06-21 ENCOUNTER — Telehealth: Payer: Self-pay | Admitting: Neurology

## 2022-06-21 NOTE — Telephone Encounter (Signed)
[place] with [facility] left vm requesting a letter for the patient stating that due to her health she requires a live in aid or caregiver. Letter can be faxed to [phone]. If have questions we can call back and speak with [facility] or [facility]. [phone].   Please advise if okay to write letter.

## 2022-06-22 NOTE — Telephone Encounter (Signed)
Letter written and faxed to number provided 

## 2022-06-30 ENCOUNTER — Other Ambulatory Visit: Payer: Self-pay

## 2022-06-30 DIAGNOSIS — G894 Chronic pain syndrome: Secondary | ICD-10-CM

## 2022-06-30 NOTE — Telephone Encounter (Signed)
Last fill 05/16/2022 next follow up 07/13/2022

## 2022-07-01 MED ORDER — HYDROCODONE-ACETAMINOPHEN 10-325 MG PO TABS: 1.0000 | ORAL_TABLET | Freq: Two times a day (BID) | ORAL | 0 refills | Status: DC

## 2022-07-04 DIAGNOSIS — G4733 Obstructive sleep apnea (adult) (pediatric): Secondary | ICD-10-CM | POA: Diagnosis not present

## 2022-07-04 DIAGNOSIS — J449 Chronic obstructive pulmonary disease, unspecified: Secondary | ICD-10-CM | POA: Diagnosis not present

## 2022-07-12 ENCOUNTER — Other Ambulatory Visit: Payer: Self-pay

## 2022-07-12 DIAGNOSIS — G894 Chronic pain syndrome: Secondary | ICD-10-CM

## 2022-07-12 MED ORDER — HYDROCODONE-ACETAMINOPHEN 10-325 MG PO TABS: 1.0000 | ORAL_TABLET | Freq: Two times a day (BID) | ORAL | 0 refills | Status: DC

## 2022-07-12 NOTE — Telephone Encounter (Signed)
[facility] doesn't have hydrocodone. She would like it to go to [facility].

## 2022-07-13 ENCOUNTER — Ambulatory Visit (INDEPENDENT_AMBULATORY_CARE_PROVIDER_SITE_OTHER): Payer: Medicare Other | Admitting: Physician Assistant

## 2022-07-13 VITALS — BP 157/52 | HR 70

## 2022-07-13 DIAGNOSIS — Z794 Long term (current) use of insulin: Secondary | ICD-10-CM | POA: Diagnosis not present

## 2022-07-13 DIAGNOSIS — M79604 Pain in right leg: Secondary | ICD-10-CM

## 2022-07-13 DIAGNOSIS — G894 Chronic pain syndrome: Secondary | ICD-10-CM | POA: Diagnosis not present

## 2022-07-13 DIAGNOSIS — E1140 Type 2 diabetes mellitus with diabetic neuropathy, unspecified: Secondary | ICD-10-CM | POA: Diagnosis not present

## 2022-07-13 DIAGNOSIS — Z23 Encounter for immunization: Secondary | ICD-10-CM

## 2022-07-13 DIAGNOSIS — T148XXA Other injury of unspecified body region, initial encounter: Secondary | ICD-10-CM

## 2022-07-13 DIAGNOSIS — L299 Pruritus, unspecified: Secondary | ICD-10-CM

## 2022-07-13 DIAGNOSIS — M79605 Pain in left leg: Secondary | ICD-10-CM

## 2022-07-13 LAB — POCT GLYCOSYLATED HEMOGLOBIN (HGB A1C): HbA1c, POC (controlled diabetic range): 6.1 % (ref 0.0–7.0)

## 2022-07-13 MED ORDER — HYDROXYZINE HCL 50 MG PO TABS: 50.0000 mg | ORAL_TABLET | Freq: Three times a day (TID) | ORAL | 1 refills | Status: DC | PRN

## 2022-07-13 MED ORDER — CYCLOBENZAPRINE HCL 10 MG PO TABS: 5.0000 mg | ORAL_TABLET | Freq: Three times a day (TID) | ORAL | 1 refills | Status: DC | PRN

## 2022-07-13 NOTE — Progress Notes (Signed)
Established Patient Office Visit  Subjective   Patient ID: [name] Rice, female    DOB: 01-Jul-1956  Age: 66 y.o. MRN: [id number]  Chief Complaint  Patient presents with   Follow-up   Diabetes    HPI Pt is a 66 yo F with PMH of T2DM with neuropathy, OSA, HTN, allergies, MDD, chronic pain.   She is accompanied by her daughter.   Overall patient is doing really well. She denies any worsening problems. She is taking her medication fairly regularly. She does not check sugars very much. No hypoglycemic events. No CP, palpitaitons, headaches or vision changes.   Continues to be in pain. Difficult to ambulate due to her bilateral feet pain and weakness. Continues to have neuropathy pain in both legs and feet. Worse with walking.   .. Active Ambulatory Problems    Diagnosis Date Noted   Type II or unspecified type diabetes mellitus with neurological manifestations, not stated as uncontrolled(250.60) 08/08/2006   Hyperlipidemia LDL goal <70 08/08/2006   Depression with anxiety 01/28/2008   Hypertension associated with diabetes ([place]) 08/08/2006   Allergic sinusitis 05/27/2009   GERD 08/08/2006   Acute bilateral low back pain 11/27/2007   OSA on CPAP    Insomnia 02/06/2011   Preventative health care 08/16/2011   History of allergic rhinitis 09/07/2011   Iron deficiency anemia 03/29/2012   Lipoma of abdominal wall 05/01/2012   Intestinal bacterial overgrowth 09/20/2012   SOB (shortness of breath) on exertion 03/31/2013   Hepatic steatosis 09/26/2013   Diverticulitis 11/27/2013   Diabetic peripheral neuropathy ([place]) 05/30/2014   Gastroparesis due to secondary diabetes ([place]) 09/16/2014   Ear pain 12/09/2014   Type 2 diabetes, controlled, with peripheral neuropathy ([place]) 12/09/2014   Diarrhea 01/12/2015   Non-proliferative diabetic retinopathy, right eye ([place]) 11/23/2015   Chronic pain syndrome 12/15/2015   Morbid obesity due to excess calories ([place]) 02/16/2016   Unstable balance  02/16/2016   Intertrigo 03/15/2016   Left knee pain 03/15/2016   Post-menopausal 03/16/2016   Acute reaction to stress 03/28/2016   Dystonia 09/21/2016   Neck pain 07/11/2017   Lisfranc's dislocation, right, initial encounter 10/06/2017   Freiberg's disease, right 10/06/2017   Vasomotor rhinitis 11/28/2017   Ear itching 01/17/2018   No energy 01/17/2018   Hordeolum externum of right lower eyelid 02/16/2018   Hot flashes 02/16/2018   Restrictive lung disease 04/12/2018   Mixed hyperlipidemia 04/12/2018   Vitamin D deficiency 04/12/2018   Drug-induced constipation 05/23/2018   Calcification and ossification of muscle 07/13/2018   Pressure injury of right foot, unstageable ([place]) 09/25/2018   Bilateral carpal tunnel syndrome post carpal tunnel release on the left 01/24/2019   Type 2 diabetes mellitus with diabetic neuropathy, with long-term current use of insulin ([place]) 06/20/2019   Dizziness 06/20/2019   Acute right ankle pain 09/25/2019   OAB (overactive bladder) 09/25/2019   Chronic kidney disease (CKD), stage III (moderate) ([place]) 10/04/2019   Trigger finger, right middle finger 12/24/2019   Trigger index finger of right hand 12/24/2019   DDD (degenerative disc disease), cervical 12/27/2019   Bilateral rotator cuff syndrome 01/24/2020   Paresthesia 06/30/2020   Weakness 06/30/2020   Chronic fatigue 06/30/2020   Moderate episode of recurrent major depressive disorder ([place]) 07/14/2020   Left ventricular hypertrophy 07/23/2020   Problems with swallowing 03/22/2021   Allergic conjunctivitis of both eyes 03/22/2021   Rosacea 04/02/2021   Chronic right shoulder pain 04/02/2021   Partial nontraumatic tear of left rotator cuff 08/16/2021  Osteopenia 12/30/2021   Blood clot in eye 12/31/2021   Diminished pulses in lower extremity 04/12/2022   Resolved Ambulatory Problems    Diagnosis Date Noted   IRON DEFICIENCY 01/30/2007   DEPRESSION 08/08/2006   TORTICOLLIS, SPASMODIC  02/13/2007   ALLERGIC RHINITIS 12/25/2006   ASTHMA 01/28/2008   COPD 08/08/2006   DIVERTICULOSIS, COLON 02/12/2003   DERMATITIS, SEBORRHEIC 01/30/2007   BPPV (benign paroxysmal positional vertigo), unspecified laterality 07/14/2009   GOUT, HX OF 08/08/2006   PERSONAL HX COLONIC POLYPS 09/16/2008   NEPHROLITHIASIS, HX OF 01/28/2008   Calf pain 11/29/2010   Tremor, essential 02/06/2011   Seborrheic dermatitis, unspecified 04/06/2011   Finger pain 04/06/2011   Shoulder pain, right 08/16/2011   Left leg pain 04/10/2012   Right ear pain 05/01/2012   Seborrheic dermatitis 05/01/2012   Hypoglycemia 05/01/2012   Vaginal candidiasis 08/22/2012   Sigmoid diverticulitis 09/15/2012   Acute-on-chronic kidney disease Stage II 09/15/2012   Cyst, breast 03/01/2013   Lactic acidosis 03/31/2013   Acute renal insufficiency 03/31/2013   Abdominal pain, left lower quadrant 04/29/2013   Gout 05/01/2013   COPD (chronic obstructive pulmonary disease) with chronic bronchitis ([place]) 12/09/2014   Right shoulder pain 02/16/2016   Fall 03/16/2016   Foot fracture, right, closed, initial encounter 08/23/2017   Chronic pain of both shoulders 12/24/2019   Past Medical History:  Diagnosis Date   Anxiety    Arthritis    Asthma    Carpal tunnel syndrome, bilateral    Cervical dystonia    COPD (chronic obstructive pulmonary disease) ([place])    Depression    Diabetes mellitus    Diverticulosis    Gastroparesis    GERD (gastroesophageal reflux disease)    History of colonic polyps    History of kidney stones    History of seborrheic dermatitis    HLD (hyperlipidemia)    Hypertension    Intussusception ([place])    Lisfranc's sprain, left, initial encounter 10/06/2017   Lumbar pain 2011   Morbid obesity ([place])    Nephrolithiasis    Pneumonia    Recurrent boils    Sleep apnea    Torn rotator cuff RT SHOULDER   Tremor CCENTRAL NERVOUS SYSTEM     ROS   See HPI.  Objective:     BP (!) 157/52    Pulse 70   LMP 10/18/1995   SpO2 100%  BP Readings from Last 3 Encounters:  07/13/22 (!) 157/52  04/12/22 106/84  12/27/21 (!) 156/62   Wt Readings from Last 3 Encounters:  04/12/22 261 lb (118.4 kg)  06/28/21 257 lb (116.6 kg)  06/30/20 276 lb (125.2 kg)      Physical Exam Constitutional:      Appearance: Normal appearance. She is obese.     Comments: In a wheelchair  Cardiovascular:     Rate and Rhythm: Normal rate and regular rhythm.  Pulmonary:     Effort: Pulmonary effort is normal.  Neurological:     General: No focal deficit present.     Mental Status: She is alert and oriented to person, place, and time.  Psychiatric:        Mood and Affect: Mood normal.      Results for orders placed or performed in visit on 07/13/22  POCT glycosylated hemoglobin (Hb A1C)  Result Value Ref Range   Hemoglobin A1C     HbA1c POC (<> result, manual entry)     HbA1c, POC (prediabetic range)     HbA1c, POC (  controlled diabetic range) 6.1 0.0 - 7.0 %    The 10-year ASCVD risk score (Arnett DK, et al., 2019) is: 26.9%    Assessment & Plan:  Marland KitchenMarland KitchenFlavia was seen today for follow-up and diabetes.  Diagnoses and all orders for this visit:  Type 2 diabetes mellitus with diabetic neuropathy, with long-term current use of insulin (HCC) -     POCT glycosylated hemoglobin (Hb A1C)  Flu vaccine need -     Flu Vaccine QUAD High Dose(Fluad)  Abrasion of skin -     Tdap vaccine greater than or equal to 7yo IM  Need for Tdap vaccination -     Tdap vaccine greater than or equal to 7yo IM  Chronic pain syndrome -     cyclobenzaprine (FLEXERIL) 10 MG tablet; Take 0.5-1 tablets (5-10 mg total) by mouth 3 (three) times daily as needed for muscle spasms. Caution: can cause drowsiness  Itching -     hydrOXYzine (ATARAX) 50 MG tablet; Take 1 tablet (50 mg total) by mouth every 8 (eight) hours as needed. For itching.   A1C looks good Continue same medications BP not to goal today but did  not take medications Keep BP log at home with goal of under 130/80 On statin Tdap due to some abrasions on arm  Flu shot given  Concern for bilateral leg pain that is worse with walking High risk for PAD with DM, HTN, HLD Ordered ABIs in [place] needs to have them done Start ASA '81mg'$  daily Certainly could be worsening neuropathy Increasing gabapentin to TID could help  For chronic pain  Increase gabapentin to three times a day Continue on norco  Does not need pain rx refills at this time  Follow up in 3 months

## 2022-07-13 NOTE — Patient Instructions (Addendum)
Will get ABI to check leg circulation.  Start ASA '81mg'$  daily.  Increase gabapentin to three times a day Magnesium '400mg'$  OTC at bedtime

## 2022-07-15 ENCOUNTER — Encounter: Payer: Self-pay | Admitting: Physician Assistant

## 2022-07-18 ENCOUNTER — Other Ambulatory Visit: Payer: Self-pay | Admitting: Physician Assistant

## 2022-07-19 ENCOUNTER — Other Ambulatory Visit: Payer: Self-pay | Admitting: Physician Assistant

## 2022-07-19 DIAGNOSIS — E559 Vitamin D deficiency, unspecified: Secondary | ICD-10-CM

## 2022-07-27 DIAGNOSIS — M79604 Pain in right leg: Secondary | ICD-10-CM | POA: Insufficient documentation

## 2022-07-27 DIAGNOSIS — L299 Pruritus, unspecified: Secondary | ICD-10-CM | POA: Insufficient documentation

## 2022-08-12 ENCOUNTER — Ambulatory Visit: Payer: Medicare Other | Admitting: Physician Assistant

## 2022-08-19 DIAGNOSIS — K529 Noninfective gastroenteritis and colitis, unspecified: Secondary | ICD-10-CM | POA: Diagnosis not present

## 2022-08-19 DIAGNOSIS — K219 Gastro-esophageal reflux disease without esophagitis: Secondary | ICD-10-CM | POA: Diagnosis not present

## 2022-08-19 DIAGNOSIS — M1990 Unspecified osteoarthritis, unspecified site: Secondary | ICD-10-CM | POA: Diagnosis not present

## 2022-08-19 DIAGNOSIS — K5732 Diverticulitis of large intestine without perforation or abscess without bleeding: Secondary | ICD-10-CM | POA: Diagnosis not present

## 2022-08-19 DIAGNOSIS — K429 Umbilical hernia without obstruction or gangrene: Secondary | ICD-10-CM | POA: Diagnosis not present

## 2022-08-19 DIAGNOSIS — R531 Weakness: Secondary | ICD-10-CM | POA: Diagnosis not present

## 2022-08-19 DIAGNOSIS — N1831 Chronic kidney disease, stage 3a: Secondary | ICD-10-CM | POA: Diagnosis not present

## 2022-08-19 DIAGNOSIS — K5900 Constipation, unspecified: Secondary | ICD-10-CM | POA: Diagnosis not present

## 2022-08-19 DIAGNOSIS — G4733 Obstructive sleep apnea (adult) (pediatric): Secondary | ICD-10-CM | POA: Diagnosis not present

## 2022-08-19 DIAGNOSIS — E785 Hyperlipidemia, unspecified: Secondary | ICD-10-CM | POA: Diagnosis not present

## 2022-08-19 DIAGNOSIS — N281 Cyst of kidney, acquired: Secondary | ICD-10-CM | POA: Diagnosis not present

## 2022-08-19 DIAGNOSIS — I129 Hypertensive chronic kidney disease with stage 1 through stage 4 chronic kidney disease, or unspecified chronic kidney disease: Secondary | ICD-10-CM | POA: Diagnosis not present

## 2022-08-19 DIAGNOSIS — R918 Other nonspecific abnormal finding of lung field: Secondary | ICD-10-CM | POA: Diagnosis not present

## 2022-08-19 DIAGNOSIS — E1122 Type 2 diabetes mellitus with diabetic chronic kidney disease: Secondary | ICD-10-CM | POA: Diagnosis not present

## 2022-08-19 DIAGNOSIS — A0471 Enterocolitis due to Clostridium difficile, recurrent: Secondary | ICD-10-CM | POA: Diagnosis not present

## 2022-08-19 DIAGNOSIS — E1142 Type 2 diabetes mellitus with diabetic polyneuropathy: Secondary | ICD-10-CM | POA: Diagnosis not present

## 2022-08-19 DIAGNOSIS — J986 Disorders of diaphragm: Secondary | ICD-10-CM | POA: Diagnosis not present

## 2022-08-19 DIAGNOSIS — J449 Chronic obstructive pulmonary disease, unspecified: Secondary | ICD-10-CM | POA: Diagnosis not present

## 2022-08-19 DIAGNOSIS — R55 Syncope and collapse: Secondary | ICD-10-CM | POA: Diagnosis not present

## 2022-08-19 DIAGNOSIS — R0602 Shortness of breath: Secondary | ICD-10-CM | POA: Diagnosis not present

## 2022-08-19 DIAGNOSIS — F419 Anxiety disorder, unspecified: Secondary | ICD-10-CM | POA: Diagnosis not present

## 2022-08-19 DIAGNOSIS — R2689 Other abnormalities of gait and mobility: Secondary | ICD-10-CM | POA: Diagnosis not present

## 2022-08-19 DIAGNOSIS — A0472 Enterocolitis due to Clostridium difficile, not specified as recurrent: Secondary | ICD-10-CM | POA: Diagnosis not present

## 2022-08-20 DIAGNOSIS — E1122 Type 2 diabetes mellitus with diabetic chronic kidney disease: Secondary | ICD-10-CM | POA: Diagnosis not present

## 2022-08-20 DIAGNOSIS — E1142 Type 2 diabetes mellitus with diabetic polyneuropathy: Secondary | ICD-10-CM | POA: Diagnosis not present

## 2022-08-20 DIAGNOSIS — N1831 Chronic kidney disease, stage 3a: Secondary | ICD-10-CM | POA: Diagnosis not present

## 2022-08-20 DIAGNOSIS — A0472 Enterocolitis due to Clostridium difficile, not specified as recurrent: Secondary | ICD-10-CM | POA: Diagnosis not present

## 2022-08-21 DIAGNOSIS — K5909 Other constipation: Secondary | ICD-10-CM | POA: Diagnosis not present

## 2022-08-21 DIAGNOSIS — R198 Other specified symptoms and signs involving the digestive system and abdomen: Secondary | ICD-10-CM | POA: Diagnosis not present

## 2022-08-21 DIAGNOSIS — A0472 Enterocolitis due to Clostridium difficile, not specified as recurrent: Secondary | ICD-10-CM | POA: Diagnosis not present

## 2022-08-21 DIAGNOSIS — N1831 Chronic kidney disease, stage 3a: Secondary | ICD-10-CM | POA: Diagnosis not present

## 2022-08-24 ENCOUNTER — Telehealth: Payer: Self-pay | Admitting: General Practice

## 2022-08-24 ENCOUNTER — Telehealth: Payer: Self-pay | Admitting: Neurology

## 2022-08-24 NOTE — Telephone Encounter (Signed)
Transition Care Management Unsuccessful Follow-up Telephone Call  Date of discharge and from where:  08/19/22 from [facility]  Attempts:  1st Attempt  Reason for unsuccessful TCM follow-up call:  Left voice message Per [name], Patient needs hospital discharge follow up within 7 days of discharge date.

## 2022-08-24 NOTE — Telephone Encounter (Signed)
Patient left vm with an update on her recent situation.   She was in the hospital Friday - Sunday with a diagnosis of C-diff. She was referred to Dr. Lelon Huh in [place], but does not have an appt until December. She is on a cancellation list.   While in the hospital she had to have supplemental oxygen because it kept dropping when she spoke (she also did not have her CPAP).   How soon does she need to see you to discuss the oxygen issues?

## 2022-08-25 NOTE — Telephone Encounter (Signed)
Patient is scheduled 08/30/2022.

## 2022-08-26 NOTE — Telephone Encounter (Signed)
Transition Care Management Unsuccessful Follow-up Telephone Call  Date of discharge and from where:  08/19/22 from [facility]  Attempts:  2nd Attempt  Reason for unsuccessful TCM follow-up call:  No answer/busy

## 2022-08-29 NOTE — Telephone Encounter (Signed)
Transition Care Management Unsuccessful Follow-up Telephone Call  Date of discharge and from where:  08/19/22 from [facility]  Attempts:  3rd Attempt  Reason for unsuccessful TCM follow-up call:  No answer/busy

## 2022-08-30 ENCOUNTER — Ambulatory Visit (INDEPENDENT_AMBULATORY_CARE_PROVIDER_SITE_OTHER): Payer: Medicare Other | Admitting: Physician Assistant

## 2022-08-30 ENCOUNTER — Encounter: Payer: Self-pay | Admitting: Physician Assistant

## 2022-08-30 ENCOUNTER — Inpatient Hospital Stay: Payer: Medicare Other | Admitting: Physician Assistant

## 2022-08-30 DIAGNOSIS — G894 Chronic pain syndrome: Secondary | ICD-10-CM | POA: Diagnosis not present

## 2022-08-30 DIAGNOSIS — A498 Other bacterial infections of unspecified site: Secondary | ICD-10-CM | POA: Diagnosis not present

## 2022-08-30 DIAGNOSIS — M5442 Lumbago with sciatica, left side: Secondary | ICD-10-CM | POA: Diagnosis not present

## 2022-08-30 DIAGNOSIS — K632 Fistula of intestine: Secondary | ICD-10-CM | POA: Diagnosis not present

## 2022-08-30 MED ORDER — PREDNISONE 50 MG PO TABS: ORAL_TABLET | ORAL | 0 refills | Status: DC

## 2022-08-30 MED ORDER — WHEELCHAIR CUSHION MISC: 1.0000 | Freq: Every day | 0 refills | Status: AC

## 2022-08-30 MED ORDER — HYDROCODONE-ACETAMINOPHEN 10-325 MG PO TABS: 1.0000 | ORAL_TABLET | Freq: Two times a day (BID) | ORAL | 0 refills | Status: DC

## 2022-08-30 NOTE — Progress Notes (Signed)
Established Patient Office Visit  Subjective   Patient ID: LETICA GIAIMO, female    DOB: Dec 10, 1955  Age: 66 y.o. MRN: [id number]  Chief Complaint  Patient presents with  . Hospitalization Follow-up    HPI Pt is a 66 yo obese female with T2DM with neuropathy and gastroparesis, CKD-3, HLD, chronic pain syndrome, chronic diverticulosis who presents to the clinic with generalized weakness with alternating diarrhea and constipation with now more consistent diarrhea 3 times a day. She went to ED on 08/24/2022.   She hospital note.   On arrival she was afebrile stable, minimal leukocytosis at 11.8, creatinine 1.25 close to baseline of approximately 1.1, and CT imaging of the abdomen and pelvis revealed potentially colitis of the descending colon also involving the sigmoid, felt to be more likely colitis rather than diverticulitis, and patient also noted to have a fistula from sigmoid to sigmoid. Stool testing was positive for Clostidioides difficile PCR, and vancomycin therapy was started. It remains unclear if the patient truly has symptomatic C. difficile, given that she has had recurrent diarrhea alternating with constipation and symptoms resolved quickly, and she really did not have any complaints with regards to her abdomen at the time of ER presentation was mainly being concern for her lightheadedness.  She remained afebrile, and as per her norm, did not have any further bowel movements after initial presentation. She has no current abdominal pain, laboratory studies have remained extremely stable, and is tolerating oral vancomycin therapy. At this point we will go ahead and transition her to outpatient therapy with Vancocin capsules to complete total of 10-day course.   She does feel like her diarrhea is better. She has one day left of vancomycin. She has GI appt December 11th.   She is very weak. She is not standing and walking on her own. She is either in wheelchair or bed. Sitting in the  wheelchair is putting pressure on her left buttock and leg. She is having some pain radiate down her left leg from time to time.   .. Active Ambulatory Problems    Diagnosis Date Noted  . Type II or unspecified type diabetes mellitus with neurological manifestations, not stated as uncontrolled(250.60) 08/08/2006  . Hyperlipidemia LDL goal <70 08/08/2006  . Depression with anxiety 01/28/2008  . Hypertension associated with diabetes ([place]) 08/08/2006  . Allergic sinusitis 05/27/2009  . GERD 08/08/2006  . Acute bilateral low back pain 11/27/2007  . OSA on CPAP   . Insomnia 02/06/2011  . Preventative health care 08/16/2011  . History of allergic rhinitis 09/07/2011  . Iron deficiency anemia 03/29/2012  . Lipoma of abdominal wall 05/01/2012  . Intestinal bacterial overgrowth 09/20/2012  . SOB (shortness of breath) on exertion 03/31/2013  . Hepatic steatosis 09/26/2013  . Diverticulitis 11/27/2013  . Diabetic peripheral neuropathy ([place]) 05/30/2014  . Gastroparesis due to secondary diabetes ([place]) 09/16/2014  . Ear pain 12/09/2014  . Type 2 diabetes, controlled, with peripheral neuropathy ([place]) 12/09/2014  . Diarrhea 01/12/2015  . Non-proliferative diabetic retinopathy, right eye ([place]) 11/23/2015  . Chronic pain syndrome 12/15/2015  . Morbid obesity due to excess calories ([place]) 02/16/2016  . Unstable balance 02/16/2016  . Intertrigo 03/15/2016  . Left knee pain 03/15/2016  . Post-menopausal 03/16/2016  . Acute reaction to stress 03/28/2016  . Dystonia 09/21/2016  . Neck pain 07/11/2017  . Lisfranc's dislocation, right, initial encounter 10/06/2017  . Freiberg's disease, right 10/06/2017  . Vasomotor rhinitis 11/28/2017  . Ear itching 01/17/2018  . No  energy 01/17/2018  . Hordeolum externum of right lower eyelid 02/16/2018  . Hot flashes 02/16/2018  . Restrictive lung disease 04/12/2018  . Mixed hyperlipidemia 04/12/2018  . Vitamin D deficiency 04/12/2018  . Drug-induced  constipation 05/23/2018  . Calcification and ossification of muscle 07/13/2018  . Pressure injury of right foot, unstageable ([place]) 09/25/2018  . Bilateral carpal tunnel syndrome post carpal tunnel release on the left 01/24/2019  . Type 2 diabetes mellitus with diabetic neuropathy, with long-term current use of insulin ([place]) 06/20/2019  . Dizziness 06/20/2019  . Acute right ankle pain 09/25/2019  . OAB (overactive bladder) 09/25/2019  . Chronic kidney disease (CKD), stage III (moderate) ([place]) 10/04/2019  . Trigger finger, right middle finger 12/24/2019  . Trigger index finger of right hand 12/24/2019  . DDD (degenerative disc disease), cervical 12/27/2019  . Bilateral rotator cuff syndrome 01/24/2020  . Paresthesia 06/30/2020  . Weakness 06/30/2020  . Chronic fatigue 06/30/2020  . Moderate episode of recurrent major depressive disorder ([place]) 07/14/2020  . Left ventricular hypertrophy 07/23/2020  . Problems with swallowing 03/22/2021  . Allergic conjunctivitis of both eyes 03/22/2021  . Rosacea 04/02/2021  . Chronic right shoulder pain 04/02/2021  . Partial nontraumatic tear of left rotator cuff 08/16/2021  . Osteopenia 12/30/2021  . Blood clot in eye 12/31/2021  . Diminished pulses in lower extremity 04/12/2022  . Bilateral leg pain 07/27/2022  . Itching 07/27/2022  . Clostridioides difficile infection 09/05/2022  . Fistula of sigmoid colon 09/05/2022  . Acute left-sided low back pain with left-sided sciatica 09/05/2022   Resolved Ambulatory Problems    Diagnosis Date Noted  . IRON DEFICIENCY 01/30/2007  . DEPRESSION 08/08/2006  . TORTICOLLIS, SPASMODIC 02/13/2007  . ALLERGIC RHINITIS 12/25/2006  . ASTHMA 01/28/2008  . COPD 08/08/2006  . DIVERTICULOSIS, COLON 02/12/2003  . DERMATITIS, SEBORRHEIC 01/30/2007  . BPPV (benign paroxysmal positional vertigo), unspecified laterality 07/14/2009  . GOUT, HX OF 08/08/2006  . PERSONAL HX COLONIC POLYPS 09/16/2008  . NEPHROLITHIASIS,  HX OF 01/28/2008  . Calf pain 11/29/2010  . Tremor, essential 02/06/2011  . Seborrheic dermatitis, unspecified 04/06/2011  . Finger pain 04/06/2011  . Shoulder pain, right 08/16/2011  . Left leg pain 04/10/2012  . Right ear pain 05/01/2012  . Seborrheic dermatitis 05/01/2012  . Hypoglycemia 05/01/2012  . Vaginal candidiasis 08/22/2012  . Sigmoid diverticulitis 09/15/2012  . Acute-on-chronic kidney disease Stage II 09/15/2012  . Cyst, breast 03/01/2013  . Lactic acidosis 03/31/2013  . Acute renal insufficiency 03/31/2013  . Abdominal pain, left lower quadrant 04/29/2013  . Gout 05/01/2013  . COPD (chronic obstructive pulmonary disease) with chronic bronchitis ([place]) 12/09/2014  . Right shoulder pain 02/16/2016  . Fall 03/16/2016  . Foot fracture, right, closed, initial encounter 08/23/2017  . Chronic pain of both shoulders 12/24/2019   Past Medical History:  Diagnosis Date  . Anxiety   . Arthritis   . Asthma   . Carpal tunnel syndrome, bilateral   . Cervical dystonia   . COPD (chronic obstructive pulmonary disease) ([place])   . Depression   . Diabetes mellitus   . Diverticulosis   . Gastroparesis   . GERD (gastroesophageal reflux disease)   . History of colonic polyps   . History of kidney stones   . History of seborrheic dermatitis   . HLD (hyperlipidemia)   . Hypertension   . Intussusception ([place])   . Lisfranc's sprain, left, initial encounter 10/06/2017  . Lumbar pain 2011  . Morbid obesity ([place])   . Nephrolithiasis   .  Pneumonia   . Recurrent boils   . Sleep apnea   . Torn rotator cuff RT SHOULDER  . Tremor CCENTRAL NERVOUS SYSTEM     ROS See HPI.    Objective:     LMP 10/18/1995  BP Readings from Last 3 Encounters:  07/13/22 (!) 157/52  04/12/22 106/84  12/27/21 (!) 156/62   Wt Readings from Last 3 Encounters:  04/12/22 261 lb (118.4 kg)  06/28/21 257 lb (116.6 kg)  06/30/20 276 lb (125.2 kg)      Physical Exam     Assessment & Plan:   Marland KitchenMarland KitchenFonnie was seen today for hospitalization follow-up.  Diagnoses and all orders for this visit:  Clostridioides difficile infection  Chronic pain syndrome -     HYDROcodone-acetaminophen (NORCO) 10-325 MG tablet; Take 1 tablet by mouth every 12 (twelve) hours. -     Misc. Devices [facility] CUSHION) MISC; 1 Device by Does not apply route daily.  Fistula of sigmoid colon  Acute left-sided low back pain with left-sided sciatica -     predniSONE (DELTASONE) 50 MG tablet; One tab PO daily for 5 days. -     Misc. Devices [facility] CUSHION) MISC; 1 Device by Does not apply route daily.   Follow up with GI for C.diff/fistula  Discussed left low back and leg pain Consider tens unit Consider exercises with ball Cushion for wheelchair ordered Encouraged icy hot patches Home PT to order .Marland KitchenPDMP reviewed during this encounter. Norco refilled.      Iran Planas, PA-C

## 2022-08-30 NOTE — Patient Instructions (Addendum)
Tens unit consider  Will refer to formal physical therapy Get a firm ball to use   Piriformis Syndrome  Piriformis syndrome is a condition that can cause pain and numbness in your buttocks and down the back of your leg. Piriformis syndrome happens when the small muscle that connects the base of your spine to your hip (piriformis muscle) presses on the nerve that runs down the back of your leg (sciatic nerve). The piriformis muscle helps your hip rotate and helps to bring your leg back and out. It also helps shift your weight to keep you stable while you are walking. The sciatic nerve runs under or through the piriformis muscle. Damage to the piriformis muscle can cause spasms that put pressure on the nerve below. This causes pain and discomfort while sitting and moving. The pain may feel as if it begins in the buttock and spreads (radiates) down your hip and thigh. What are the causes? This condition is caused by pressure on the sciatic nerve from the piriformis muscle. The piriformis muscle can get irritated with overuse, especially if other hip muscles are weak and the piriformis muscle has to do extra work. Piriformis syndrome can also occur after an injury, like a fall onto your buttocks. What increases the risk? You are more likely to develop this condition if you: Are a woman. Sit for long periods of time. Are a cyclist. Have weak buttocks muscles (gluteal muscles). What are the signs or symptoms? Symptoms of this condition include: Pain, tingling, or numbness that starts in the buttock and runs down the back of your leg (sciatica). Pain in the groin or thigh area. Your symptoms may get worse: The longer you sit. When you walk, run, or climb stairs. When straining to have a bowel movement. How is this diagnosed? This condition is diagnosed based on your symptoms, medical history, and physical exam. During the exam, your health care provider may: Move your leg into different  positions to check for pain. Press on the muscles of your hip and buttock to see if that increases your symptoms. You may also have tests, including: Imaging tests such as X-rays, CT, MRI, or ultrasound. Electromyogram (EMG). This test measures electrical signals sent by your nerves into the muscles. Nerve conduction study. This test measures how well electrical signals pass through your nerves. How is this treated? This condition may be treated by: Stopping all activities that cause pain or make your condition worse. Applying ice or using heat therapy. Taking medicines to reduce pain and swelling. Taking a muscle relaxer (muscle relaxant) to stop muscle spasms. Doing range-of-motion and strengthening exercises (physical therapy) as told by your health care provider. Having massage, acupuncture, or local electrical stimulation (transcutaneous electrical nerve stimulation, TENS). Getting an injection of medicine in the piriformis muscle. Your health care provider will choose the medicine based on your condition. He or she may inject: An anti-inflammatory medicine (steroid) to reduce swelling. A numbing medicine (local anesthetic) to block the pain. Botulinum toxin. The toxin blocks nerve impulses to specific muscles to reduce muscle tension. In rare cases, you may need surgery to cut the muscle and release pressure on the nerve if other treatments do not work. Follow these instructions at home: Activity Do not sit for long periods. Get up and walk around every 20 minutes or as often as told by your health care provider. When driving long distances, make sure to take frequent stops to get up and stretch. Use a cushion when you sit  on hard surfaces. Do exercises as told by your health care provider. Return to your normal activities as told by your health care provider. Ask your health care provider what activities are safe for you. Managing pain, stiffness, and swelling     If directed,  apply heat to the area as often as told by your health care provider. Use the heat source that your health care provider recommends, such as a moist heat pack or a heating pad. Place a towel between your skin and the heat source. Leave the heat on for 20-30 minutes. Remove the heat if your skin turns bright red. This is especially important if you are unable to feel pain, heat, or cold. You have a greater risk of getting burned. If directed, put ice on the injured area. To do this: Put ice in a plastic bag. Place a towel between your skin and the bag. Leave the ice on for 20 minutes, 2-3 times a day. Remove the ice if your skin turns bright red. This is very important. If you cannot feel pain, heat, or cold, you have a greater risk of damage to the area. General instructions Take over-the-counter and prescription medicines only as told by your health care provider. Ask your health care provider if the medicine prescribed to you requires you to avoid driving or using machinery. You may need to take these actions to prevent or treat constipation: Drink enough fluid to keep your urine pale yellow. Take over-the-counter or prescription medicines. Eat foods that are high in fiber, such as beans, whole grains, and fresh fruits and vegetables. Limit foods that are high in fat and processed sugars, such as fried or sweet foods. Keep all follow-up visits. This is important. How is this prevented? Do not sit for longer than 20 minutes at a time. When you sit, choose padded surfaces. Warm up and stretch before being active. Cool down and stretch after being active. Contact a health care provider if: Your pain and stiffness continue or get worse. Your leg or hip becomes weak. You have changes in your bowel function or bladder function. Summary Piriformis syndrome is a condition that can cause pain, tingling, and numbness in your buttocks and down the back of your leg. You may try applying heat or ice  to relieve the pain. Do not sit for long periods. Get up and walk around every 20 minutes or as often as told by your health care provider. This information is not intended to replace advice given to you by your health care provider. Make sure you discuss any questions you have with your health care provider. Document Revised: 03/29/2021 Document Reviewed: 03/29/2021 Elsevier Patient Education  [place].

## 2022-09-02 DIAGNOSIS — I152 Hypertension secondary to endocrine disorders: Secondary | ICD-10-CM | POA: Diagnosis not present

## 2022-09-02 DIAGNOSIS — A0472 Enterocolitis due to Clostridium difficile, not specified as recurrent: Secondary | ICD-10-CM | POA: Diagnosis not present

## 2022-09-02 DIAGNOSIS — R933 Abnormal findings on diagnostic imaging of other parts of digestive tract: Secondary | ICD-10-CM | POA: Diagnosis not present

## 2022-09-02 DIAGNOSIS — E1159 Type 2 diabetes mellitus with other circulatory complications: Secondary | ICD-10-CM | POA: Diagnosis not present

## 2022-09-05 DIAGNOSIS — A498 Other bacterial infections of unspecified site: Secondary | ICD-10-CM | POA: Insufficient documentation

## 2022-09-05 DIAGNOSIS — M5442 Lumbago with sciatica, left side: Secondary | ICD-10-CM | POA: Insufficient documentation

## 2022-09-05 DIAGNOSIS — K632 Fistula of intestine: Secondary | ICD-10-CM | POA: Insufficient documentation

## 2022-09-16 DIAGNOSIS — D126 Benign neoplasm of colon, unspecified: Secondary | ICD-10-CM | POA: Insufficient documentation

## 2022-09-18 DIAGNOSIS — F419 Anxiety disorder, unspecified: Secondary | ICD-10-CM | POA: Diagnosis not present

## 2022-09-18 DIAGNOSIS — M5030 Other cervical disc degeneration, unspecified cervical region: Secondary | ICD-10-CM | POA: Diagnosis not present

## 2022-09-18 DIAGNOSIS — K219 Gastro-esophageal reflux disease without esophagitis: Secondary | ICD-10-CM | POA: Diagnosis not present

## 2022-09-18 DIAGNOSIS — A0472 Enterocolitis due to Clostridium difficile, not specified as recurrent: Secondary | ICD-10-CM | POA: Diagnosis not present

## 2022-09-18 DIAGNOSIS — K5900 Constipation, unspecified: Secondary | ICD-10-CM | POA: Diagnosis not present

## 2022-09-18 DIAGNOSIS — G8929 Other chronic pain: Secondary | ICD-10-CM | POA: Diagnosis not present

## 2022-09-18 DIAGNOSIS — G4733 Obstructive sleep apnea (adult) (pediatric): Secondary | ICD-10-CM | POA: Diagnosis not present

## 2022-09-18 DIAGNOSIS — E1159 Type 2 diabetes mellitus with other circulatory complications: Secondary | ICD-10-CM | POA: Diagnosis not present

## 2022-09-18 DIAGNOSIS — K3184 Gastroparesis: Secondary | ICD-10-CM | POA: Diagnosis not present

## 2022-09-18 DIAGNOSIS — E782 Mixed hyperlipidemia: Secondary | ICD-10-CM | POA: Diagnosis not present

## 2022-09-18 DIAGNOSIS — E1143 Type 2 diabetes mellitus with diabetic autonomic (poly)neuropathy: Secondary | ICD-10-CM | POA: Diagnosis not present

## 2022-09-18 DIAGNOSIS — F32A Depression, unspecified: Secondary | ICD-10-CM | POA: Diagnosis not present

## 2022-09-18 DIAGNOSIS — J449 Chronic obstructive pulmonary disease, unspecified: Secondary | ICD-10-CM | POA: Diagnosis not present

## 2022-09-18 DIAGNOSIS — E1122 Type 2 diabetes mellitus with diabetic chronic kidney disease: Secondary | ICD-10-CM | POA: Diagnosis not present

## 2022-09-18 DIAGNOSIS — I152 Hypertension secondary to endocrine disorders: Secondary | ICD-10-CM | POA: Diagnosis not present

## 2022-09-18 DIAGNOSIS — N183 Chronic kidney disease, stage 3 unspecified: Secondary | ICD-10-CM | POA: Diagnosis not present

## 2022-09-22 DIAGNOSIS — G629 Polyneuropathy, unspecified: Secondary | ICD-10-CM | POA: Diagnosis not present

## 2022-09-22 DIAGNOSIS — E1159 Type 2 diabetes mellitus with other circulatory complications: Secondary | ICD-10-CM | POA: Diagnosis not present

## 2022-09-22 DIAGNOSIS — I152 Hypertension secondary to endocrine disorders: Secondary | ICD-10-CM | POA: Diagnosis not present

## 2022-10-06 DIAGNOSIS — Z1211 Encounter for screening for malignant neoplasm of colon: Secondary | ICD-10-CM | POA: Diagnosis not present

## 2022-10-06 DIAGNOSIS — D120 Benign neoplasm of cecum: Secondary | ICD-10-CM | POA: Diagnosis not present

## 2022-10-19 DIAGNOSIS — M5030 Other cervical disc degeneration, unspecified cervical region: Secondary | ICD-10-CM | POA: Diagnosis not present

## 2022-10-19 DIAGNOSIS — E1159 Type 2 diabetes mellitus with other circulatory complications: Secondary | ICD-10-CM | POA: Diagnosis not present

## 2022-10-19 DIAGNOSIS — G8929 Other chronic pain: Secondary | ICD-10-CM | POA: Diagnosis not present

## 2022-10-19 DIAGNOSIS — E782 Mixed hyperlipidemia: Secondary | ICD-10-CM | POA: Diagnosis not present

## 2022-10-19 DIAGNOSIS — F419 Anxiety disorder, unspecified: Secondary | ICD-10-CM | POA: Diagnosis not present

## 2022-10-19 DIAGNOSIS — K3184 Gastroparesis: Secondary | ICD-10-CM | POA: Diagnosis not present

## 2022-10-19 DIAGNOSIS — E1122 Type 2 diabetes mellitus with diabetic chronic kidney disease: Secondary | ICD-10-CM | POA: Diagnosis not present

## 2022-10-19 DIAGNOSIS — K5900 Constipation, unspecified: Secondary | ICD-10-CM | POA: Diagnosis not present

## 2022-10-19 DIAGNOSIS — J449 Chronic obstructive pulmonary disease, unspecified: Secondary | ICD-10-CM | POA: Diagnosis not present

## 2022-10-19 DIAGNOSIS — I152 Hypertension secondary to endocrine disorders: Secondary | ICD-10-CM | POA: Diagnosis not present

## 2022-10-19 DIAGNOSIS — E1143 Type 2 diabetes mellitus with diabetic autonomic (poly)neuropathy: Secondary | ICD-10-CM | POA: Diagnosis not present

## 2022-10-19 DIAGNOSIS — N183 Chronic kidney disease, stage 3 unspecified: Secondary | ICD-10-CM | POA: Diagnosis not present

## 2022-10-19 DIAGNOSIS — F32A Depression, unspecified: Secondary | ICD-10-CM | POA: Diagnosis not present

## 2022-10-19 DIAGNOSIS — G4733 Obstructive sleep apnea (adult) (pediatric): Secondary | ICD-10-CM | POA: Diagnosis not present

## 2022-10-19 DIAGNOSIS — K219 Gastro-esophageal reflux disease without esophagitis: Secondary | ICD-10-CM | POA: Diagnosis not present

## 2022-10-19 DIAGNOSIS — A0472 Enterocolitis due to Clostridium difficile, not specified as recurrent: Secondary | ICD-10-CM | POA: Diagnosis not present

## 2022-10-25 DIAGNOSIS — G8929 Other chronic pain: Secondary | ICD-10-CM | POA: Diagnosis not present

## 2022-10-25 DIAGNOSIS — E1122 Type 2 diabetes mellitus with diabetic chronic kidney disease: Secondary | ICD-10-CM | POA: Diagnosis not present

## 2022-10-25 DIAGNOSIS — K219 Gastro-esophageal reflux disease without esophagitis: Secondary | ICD-10-CM | POA: Diagnosis not present

## 2022-10-25 DIAGNOSIS — E1159 Type 2 diabetes mellitus with other circulatory complications: Secondary | ICD-10-CM | POA: Diagnosis not present

## 2022-10-25 DIAGNOSIS — M5030 Other cervical disc degeneration, unspecified cervical region: Secondary | ICD-10-CM | POA: Diagnosis not present

## 2022-10-25 DIAGNOSIS — G4733 Obstructive sleep apnea (adult) (pediatric): Secondary | ICD-10-CM | POA: Diagnosis not present

## 2022-10-25 DIAGNOSIS — I152 Hypertension secondary to endocrine disorders: Secondary | ICD-10-CM | POA: Diagnosis not present

## 2022-10-25 DIAGNOSIS — K5900 Constipation, unspecified: Secondary | ICD-10-CM | POA: Diagnosis not present

## 2022-10-25 DIAGNOSIS — E782 Mixed hyperlipidemia: Secondary | ICD-10-CM | POA: Diagnosis not present

## 2022-10-25 DIAGNOSIS — J449 Chronic obstructive pulmonary disease, unspecified: Secondary | ICD-10-CM | POA: Diagnosis not present

## 2022-10-25 DIAGNOSIS — E1143 Type 2 diabetes mellitus with diabetic autonomic (poly)neuropathy: Secondary | ICD-10-CM | POA: Diagnosis not present

## 2022-10-25 DIAGNOSIS — F419 Anxiety disorder, unspecified: Secondary | ICD-10-CM | POA: Diagnosis not present

## 2022-10-25 DIAGNOSIS — K3184 Gastroparesis: Secondary | ICD-10-CM | POA: Diagnosis not present

## 2022-10-25 DIAGNOSIS — N183 Chronic kidney disease, stage 3 unspecified: Secondary | ICD-10-CM | POA: Diagnosis not present

## 2022-10-25 DIAGNOSIS — A0472 Enterocolitis due to Clostridium difficile, not specified as recurrent: Secondary | ICD-10-CM | POA: Diagnosis not present

## 2022-10-25 DIAGNOSIS — F32A Depression, unspecified: Secondary | ICD-10-CM | POA: Diagnosis not present

## 2022-10-28 DIAGNOSIS — E1159 Type 2 diabetes mellitus with other circulatory complications: Secondary | ICD-10-CM | POA: Diagnosis not present

## 2022-10-28 DIAGNOSIS — G4733 Obstructive sleep apnea (adult) (pediatric): Secondary | ICD-10-CM | POA: Diagnosis not present

## 2022-10-28 DIAGNOSIS — K3184 Gastroparesis: Secondary | ICD-10-CM | POA: Diagnosis not present

## 2022-10-28 DIAGNOSIS — E1122 Type 2 diabetes mellitus with diabetic chronic kidney disease: Secondary | ICD-10-CM | POA: Diagnosis not present

## 2022-10-28 DIAGNOSIS — F419 Anxiety disorder, unspecified: Secondary | ICD-10-CM | POA: Diagnosis not present

## 2022-10-28 DIAGNOSIS — J449 Chronic obstructive pulmonary disease, unspecified: Secondary | ICD-10-CM | POA: Diagnosis not present

## 2022-10-28 DIAGNOSIS — F32A Depression, unspecified: Secondary | ICD-10-CM | POA: Diagnosis not present

## 2022-10-28 DIAGNOSIS — A0472 Enterocolitis due to Clostridium difficile, not specified as recurrent: Secondary | ICD-10-CM | POA: Diagnosis not present

## 2022-10-28 DIAGNOSIS — K5900 Constipation, unspecified: Secondary | ICD-10-CM | POA: Diagnosis not present

## 2022-10-28 DIAGNOSIS — I152 Hypertension secondary to endocrine disorders: Secondary | ICD-10-CM | POA: Diagnosis not present

## 2022-10-28 DIAGNOSIS — K219 Gastro-esophageal reflux disease without esophagitis: Secondary | ICD-10-CM | POA: Diagnosis not present

## 2022-10-28 DIAGNOSIS — E782 Mixed hyperlipidemia: Secondary | ICD-10-CM | POA: Diagnosis not present

## 2022-10-28 DIAGNOSIS — G8929 Other chronic pain: Secondary | ICD-10-CM | POA: Diagnosis not present

## 2022-10-28 DIAGNOSIS — E1143 Type 2 diabetes mellitus with diabetic autonomic (poly)neuropathy: Secondary | ICD-10-CM | POA: Diagnosis not present

## 2022-10-28 DIAGNOSIS — M5030 Other cervical disc degeneration, unspecified cervical region: Secondary | ICD-10-CM | POA: Diagnosis not present

## 2022-10-28 DIAGNOSIS — N183 Chronic kidney disease, stage 3 unspecified: Secondary | ICD-10-CM | POA: Diagnosis not present

## 2022-11-01 DIAGNOSIS — F32A Depression, unspecified: Secondary | ICD-10-CM | POA: Diagnosis not present

## 2022-11-01 DIAGNOSIS — G8929 Other chronic pain: Secondary | ICD-10-CM | POA: Diagnosis not present

## 2022-11-01 DIAGNOSIS — K5900 Constipation, unspecified: Secondary | ICD-10-CM | POA: Diagnosis not present

## 2022-11-01 DIAGNOSIS — E1143 Type 2 diabetes mellitus with diabetic autonomic (poly)neuropathy: Secondary | ICD-10-CM | POA: Diagnosis not present

## 2022-11-01 DIAGNOSIS — J449 Chronic obstructive pulmonary disease, unspecified: Secondary | ICD-10-CM | POA: Diagnosis not present

## 2022-11-01 DIAGNOSIS — N183 Chronic kidney disease, stage 3 unspecified: Secondary | ICD-10-CM | POA: Diagnosis not present

## 2022-11-01 DIAGNOSIS — A0472 Enterocolitis due to Clostridium difficile, not specified as recurrent: Secondary | ICD-10-CM | POA: Diagnosis not present

## 2022-11-01 DIAGNOSIS — G4733 Obstructive sleep apnea (adult) (pediatric): Secondary | ICD-10-CM | POA: Diagnosis not present

## 2022-11-01 DIAGNOSIS — E782 Mixed hyperlipidemia: Secondary | ICD-10-CM | POA: Diagnosis not present

## 2022-11-01 DIAGNOSIS — M5030 Other cervical disc degeneration, unspecified cervical region: Secondary | ICD-10-CM | POA: Diagnosis not present

## 2022-11-01 DIAGNOSIS — E1159 Type 2 diabetes mellitus with other circulatory complications: Secondary | ICD-10-CM | POA: Diagnosis not present

## 2022-11-01 DIAGNOSIS — F419 Anxiety disorder, unspecified: Secondary | ICD-10-CM | POA: Diagnosis not present

## 2022-11-01 DIAGNOSIS — E1122 Type 2 diabetes mellitus with diabetic chronic kidney disease: Secondary | ICD-10-CM | POA: Diagnosis not present

## 2022-11-01 DIAGNOSIS — K3184 Gastroparesis: Secondary | ICD-10-CM | POA: Diagnosis not present

## 2022-11-01 DIAGNOSIS — K219 Gastro-esophageal reflux disease without esophagitis: Secondary | ICD-10-CM | POA: Diagnosis not present

## 2022-11-01 DIAGNOSIS — I152 Hypertension secondary to endocrine disorders: Secondary | ICD-10-CM | POA: Diagnosis not present

## 2022-11-03 DIAGNOSIS — K219 Gastro-esophageal reflux disease without esophagitis: Secondary | ICD-10-CM | POA: Diagnosis not present

## 2022-11-03 DIAGNOSIS — M5030 Other cervical disc degeneration, unspecified cervical region: Secondary | ICD-10-CM | POA: Diagnosis not present

## 2022-11-03 DIAGNOSIS — J449 Chronic obstructive pulmonary disease, unspecified: Secondary | ICD-10-CM | POA: Diagnosis not present

## 2022-11-03 DIAGNOSIS — I152 Hypertension secondary to endocrine disorders: Secondary | ICD-10-CM | POA: Diagnosis not present

## 2022-11-03 DIAGNOSIS — K5900 Constipation, unspecified: Secondary | ICD-10-CM | POA: Diagnosis not present

## 2022-11-03 DIAGNOSIS — A0472 Enterocolitis due to Clostridium difficile, not specified as recurrent: Secondary | ICD-10-CM | POA: Diagnosis not present

## 2022-11-03 DIAGNOSIS — K3184 Gastroparesis: Secondary | ICD-10-CM | POA: Diagnosis not present

## 2022-11-03 DIAGNOSIS — N183 Chronic kidney disease, stage 3 unspecified: Secondary | ICD-10-CM | POA: Diagnosis not present

## 2022-11-03 DIAGNOSIS — E1143 Type 2 diabetes mellitus with diabetic autonomic (poly)neuropathy: Secondary | ICD-10-CM | POA: Diagnosis not present

## 2022-11-03 DIAGNOSIS — G4733 Obstructive sleep apnea (adult) (pediatric): Secondary | ICD-10-CM | POA: Diagnosis not present

## 2022-11-03 DIAGNOSIS — G8929 Other chronic pain: Secondary | ICD-10-CM | POA: Diagnosis not present

## 2022-11-03 DIAGNOSIS — E782 Mixed hyperlipidemia: Secondary | ICD-10-CM | POA: Diagnosis not present

## 2022-11-03 DIAGNOSIS — F419 Anxiety disorder, unspecified: Secondary | ICD-10-CM | POA: Diagnosis not present

## 2022-11-03 DIAGNOSIS — E1159 Type 2 diabetes mellitus with other circulatory complications: Secondary | ICD-10-CM | POA: Diagnosis not present

## 2022-11-03 DIAGNOSIS — E1122 Type 2 diabetes mellitus with diabetic chronic kidney disease: Secondary | ICD-10-CM | POA: Diagnosis not present

## 2022-11-03 DIAGNOSIS — F32A Depression, unspecified: Secondary | ICD-10-CM | POA: Diagnosis not present

## 2022-11-04 ENCOUNTER — Ambulatory Visit (INDEPENDENT_AMBULATORY_CARE_PROVIDER_SITE_OTHER): Payer: Medicare Other | Admitting: Physician Assistant

## 2022-11-04 VITALS — BP 104/54 | HR 61 | Ht 63.0 in | Wt 257.0 lb

## 2022-11-04 DIAGNOSIS — E1142 Type 2 diabetes mellitus with diabetic polyneuropathy: Secondary | ICD-10-CM

## 2022-11-04 DIAGNOSIS — L304 Erythema intertrigo: Secondary | ICD-10-CM

## 2022-11-04 DIAGNOSIS — E1140 Type 2 diabetes mellitus with diabetic neuropathy, unspecified: Secondary | ICD-10-CM

## 2022-11-04 DIAGNOSIS — Z794 Long term (current) use of insulin: Secondary | ICD-10-CM

## 2022-11-04 DIAGNOSIS — R0602 Shortness of breath: Secondary | ICD-10-CM

## 2022-11-04 DIAGNOSIS — Z23 Encounter for immunization: Secondary | ICD-10-CM | POA: Diagnosis not present

## 2022-11-04 DIAGNOSIS — E1159 Type 2 diabetes mellitus with other circulatory complications: Secondary | ICD-10-CM

## 2022-11-04 DIAGNOSIS — I152 Hypertension secondary to endocrine disorders: Secondary | ICD-10-CM

## 2022-11-04 DIAGNOSIS — G894 Chronic pain syndrome: Secondary | ICD-10-CM

## 2022-11-04 LAB — POCT GLYCOSYLATED HEMOGLOBIN (HGB A1C): Hemoglobin A1C: 7.7 % — AB (ref 4.0–5.6)

## 2022-11-04 MED ORDER — TOUJEO MAX SOLOSTAR 300 UNIT/ML ~~LOC~~ SOPN: 40.0000 [IU] | PEN_INJECTOR | Freq: Every day | SUBCUTANEOUS | 3 refills | Status: DC

## 2022-11-04 MED ORDER — FUROSEMIDE 20 MG PO TABS: ORAL_TABLET | ORAL | 1 refills | Status: DC

## 2022-11-04 MED ORDER — NYSTATIN 100000 UNIT/GM EX OINT: TOPICAL_OINTMENT | CUTANEOUS | 1 refills | Status: DC

## 2022-11-04 MED ORDER — DULOXETINE HCL 60 MG PO CPEP: 120.0000 mg | ORAL_CAPSULE | Freq: Every day | ORAL | 3 refills | Status: DC

## 2022-11-04 MED ORDER — LOSARTAN POTASSIUM 25 MG PO TABS: ORAL_TABLET | ORAL | 1 refills | Status: DC

## 2022-11-04 MED ORDER — HYDROCODONE-ACETAMINOPHEN 10-325 MG PO TABS: 1.0000 | ORAL_TABLET | Freq: Two times a day (BID) | ORAL | 0 refills | Status: DC

## 2022-11-04 MED ORDER — CYCLOBENZAPRINE HCL 10 MG PO TABS: 5.0000 mg | ORAL_TABLET | Freq: Three times a day (TID) | ORAL | 1 refills | Status: DC | PRN

## 2022-11-04 MED ORDER — GABAPENTIN 800 MG PO TABS: 800.0000 mg | ORAL_TABLET | Freq: Three times a day (TID) | ORAL | 1 refills | Status: DC

## 2022-11-04 MED ORDER — TRUE METRIX BLOOD GLUCOSE TEST VI STRP: ORAL_STRIP | 3 refills | Status: DC

## 2022-11-05 LAB — HEMOGLOBIN A1C
Hgb A1c MFr Bld: 6.9 % of total Hgb — ABNORMAL HIGH (ref <5.7)
Mean Plasma Glucose: 151 mg/dL
eAG (mmol/L): 8.4 mmol/L

## 2022-11-07 ENCOUNTER — Encounter: Payer: Self-pay | Admitting: Physician Assistant

## 2022-11-07 DIAGNOSIS — E1122 Type 2 diabetes mellitus with diabetic chronic kidney disease: Secondary | ICD-10-CM | POA: Diagnosis not present

## 2022-11-07 DIAGNOSIS — J449 Chronic obstructive pulmonary disease, unspecified: Secondary | ICD-10-CM | POA: Diagnosis not present

## 2022-11-07 DIAGNOSIS — I152 Hypertension secondary to endocrine disorders: Secondary | ICD-10-CM | POA: Diagnosis not present

## 2022-11-07 DIAGNOSIS — K3184 Gastroparesis: Secondary | ICD-10-CM | POA: Diagnosis not present

## 2022-11-07 DIAGNOSIS — F419 Anxiety disorder, unspecified: Secondary | ICD-10-CM | POA: Diagnosis not present

## 2022-11-07 DIAGNOSIS — M5030 Other cervical disc degeneration, unspecified cervical region: Secondary | ICD-10-CM | POA: Diagnosis not present

## 2022-11-07 DIAGNOSIS — E782 Mixed hyperlipidemia: Secondary | ICD-10-CM | POA: Diagnosis not present

## 2022-11-07 DIAGNOSIS — N183 Chronic kidney disease, stage 3 unspecified: Secondary | ICD-10-CM | POA: Diagnosis not present

## 2022-11-07 DIAGNOSIS — E1159 Type 2 diabetes mellitus with other circulatory complications: Secondary | ICD-10-CM | POA: Diagnosis not present

## 2022-11-07 DIAGNOSIS — E1143 Type 2 diabetes mellitus with diabetic autonomic (poly)neuropathy: Secondary | ICD-10-CM | POA: Diagnosis not present

## 2022-11-07 DIAGNOSIS — G8929 Other chronic pain: Secondary | ICD-10-CM | POA: Diagnosis not present

## 2022-11-07 DIAGNOSIS — F32A Depression, unspecified: Secondary | ICD-10-CM | POA: Diagnosis not present

## 2022-11-07 DIAGNOSIS — G4733 Obstructive sleep apnea (adult) (pediatric): Secondary | ICD-10-CM | POA: Diagnosis not present

## 2022-11-07 DIAGNOSIS — K5900 Constipation, unspecified: Secondary | ICD-10-CM | POA: Diagnosis not present

## 2022-11-07 DIAGNOSIS — K219 Gastro-esophageal reflux disease without esophagitis: Secondary | ICD-10-CM | POA: Diagnosis not present

## 2022-11-07 DIAGNOSIS — A0472 Enterocolitis due to Clostridium difficile, not specified as recurrent: Secondary | ICD-10-CM | POA: Diagnosis not present

## 2022-11-07 MED ORDER — SEMAGLUTIDE (1 MG/DOSE) 4 MG/3ML ~~LOC~~ SOPN: 1.0000 mg | PEN_INJECTOR | SUBCUTANEOUS | 0 refills | Status: DC

## 2022-11-07 NOTE — Progress Notes (Signed)
Your blood A1C was better and under 7 despite your point of care hemoglobin being 7.7 in office. Treatment plan stays the same.   FYI Dr. Madilyn Fireman.

## 2022-11-07 NOTE — Progress Notes (Signed)
Established Patient Office Visit  Subjective   Patient ID: [name] Rice, female    DOB: 1955/11/29  Age: 67 y.o. MRN: [id number]  Chief Complaint  Patient presents with   Follow-up   Diabetes    HPI Pt is a 67 yo obese female with T2DM, HTN, OSA, GERD, HLD, chronic pain who presents to the clinic for 3 month follow up.   She is checking her sugars at home and does need more test strips. She is usually running 85-120 with most checks. No open sores or wounds. Her insurance will not pay for tresbia anymore and wants to to switch to toujeo. Continues to have neuropathy in her feet. Gabapentin helps with this.  Needs refill for nystatin for recurrent yeast infections in the folds of her skin.   She uses norco as needed up to twice a day for pain. She does need refills.   Pt denies any CP, palpitations, headaches or vision changes.   She does feel like the in home PT is helping with her lower extremity strength.   She is using her CPAP nightly.    Active Ambulatory Problems    Diagnosis Date Noted   Type II or unspecified type diabetes mellitus with neurological manifestations, not stated as uncontrolled(250.60) 08/08/2006   Hyperlipidemia LDL goal <70 08/08/2006   Depression with anxiety 01/28/2008   Hypertension associated with diabetes ([place]) 08/08/2006   Allergic sinusitis 05/27/2009   GERD 08/08/2006   Acute bilateral low back pain 11/27/2007   OSA on CPAP    Insomnia 02/06/2011   Preventative health care 08/16/2011   History of allergic rhinitis 09/07/2011   Iron deficiency anemia 03/29/2012   Lipoma of abdominal wall 05/01/2012   Intestinal bacterial overgrowth 09/20/2012   SOB (shortness of breath) on exertion 03/31/2013   Hepatic steatosis 09/26/2013   Diverticulitis 11/27/2013   Diabetic peripheral neuropathy ([place]) 05/30/2014   Gastroparesis due to secondary diabetes ([place]) 09/16/2014   Ear pain 12/09/2014   Type 2 diabetes, controlled, with peripheral neuropathy  ([place]) 12/09/2014   Diarrhea 01/12/2015   Non-proliferative diabetic retinopathy, right eye ([place]) 11/23/2015   Chronic pain syndrome 12/15/2015   Morbid obesity due to excess calories ([place]) 02/16/2016   Unstable balance 02/16/2016   Intertrigo 03/15/2016   Left knee pain 03/15/2016   Post-menopausal 03/16/2016   Acute reaction to stress 03/28/2016   Dystonia 09/21/2016   Neck pain 07/11/2017   Lisfranc's dislocation, right, initial encounter 10/06/2017   Freiberg's disease, right 10/06/2017   Vasomotor rhinitis 11/28/2017   Ear itching 01/17/2018   No energy 01/17/2018   Hordeolum externum of right lower eyelid 02/16/2018   Hot flashes 02/16/2018   Restrictive lung disease 04/12/2018   Mixed hyperlipidemia 04/12/2018   Vitamin D deficiency 04/12/2018   Drug-induced constipation 05/23/2018   Calcification and ossification of muscle 07/13/2018   Pressure injury of right foot, unstageable ([place]) 09/25/2018   Bilateral carpal tunnel syndrome post carpal tunnel release on the left 01/24/2019   Type 2 diabetes mellitus with diabetic neuropathy, with long-term current use of insulin ([place]) 06/20/2019   Dizziness 06/20/2019   Acute right ankle pain 09/25/2019   OAB (overactive bladder) 09/25/2019   Chronic kidney disease (CKD), stage III (moderate) ([place]) 10/04/2019   Trigger finger, right middle finger 12/24/2019   Trigger index finger of right hand 12/24/2019   DDD (degenerative disc disease), cervical 12/27/2019   Bilateral rotator cuff syndrome 01/24/2020   Paresthesia 06/30/2020   Weakness 06/30/2020   Chronic  fatigue 06/30/2020   Moderate episode of recurrent major depressive disorder ([place]) 07/14/2020   Left ventricular hypertrophy 07/23/2020   Problems with swallowing 03/22/2021   Allergic conjunctivitis of both eyes 03/22/2021   Rosacea 04/02/2021   Chronic right shoulder pain 04/02/2021   Partial nontraumatic tear of left rotator cuff 08/16/2021   Osteopenia 12/30/2021    Blood clot in eye 12/31/2021   Diminished pulses in lower extremity 04/12/2022   Bilateral leg pain 07/27/2022   Itching 07/27/2022   Clostridioides difficile infection 09/05/2022   Fistula of sigmoid colon 09/05/2022   Acute left-sided low back pain with left-sided sciatica 09/05/2022   Tubular adenoma of colon 09/16/2022   Resolved Ambulatory Problems    Diagnosis Date Noted   IRON DEFICIENCY 01/30/2007   DEPRESSION 08/08/2006   TORTICOLLIS, SPASMODIC 02/13/2007   ALLERGIC RHINITIS 12/25/2006   ASTHMA 01/28/2008   COPD 08/08/2006   DIVERTICULOSIS, COLON 02/12/2003   DERMATITIS, SEBORRHEIC 01/30/2007   BPPV (benign paroxysmal positional vertigo), unspecified laterality 07/14/2009   GOUT, HX OF 08/08/2006   PERSONAL HX COLONIC POLYPS 09/16/2008   NEPHROLITHIASIS, HX OF 01/28/2008   Calf pain 11/29/2010   Tremor, essential 02/06/2011   Seborrheic dermatitis, unspecified 04/06/2011   Finger pain 04/06/2011   Shoulder pain, right 08/16/2011   Left leg pain 04/10/2012   Right ear pain 05/01/2012   Seborrheic dermatitis 05/01/2012   Hypoglycemia 05/01/2012   Vaginal candidiasis 08/22/2012   Sigmoid diverticulitis 09/15/2012   Acute-on-chronic kidney disease Stage II 09/15/2012   Cyst, breast 03/01/2013   Lactic acidosis 03/31/2013   Acute renal insufficiency 03/31/2013   Abdominal pain, left lower quadrant 04/29/2013   Gout 05/01/2013   COPD (chronic obstructive pulmonary disease) with chronic bronchitis ([place]) 12/09/2014   Right shoulder pain 02/16/2016   Fall 03/16/2016   Foot fracture, right, closed, initial encounter 08/23/2017   Chronic pain of both shoulders 12/24/2019   Past Medical History:  Diagnosis Date   Anxiety    Arthritis    Asthma    Carpal tunnel syndrome, bilateral    Cervical dystonia    COPD (chronic obstructive pulmonary disease) ([place])    Depression    Diabetes mellitus    Diverticulosis    Gastroparesis    GERD (gastroesophageal reflux  disease)    History of colonic polyps    History of kidney stones    History of seborrheic dermatitis    HLD (hyperlipidemia)    Hypertension    Intussusception ([place])    Lisfranc's sprain, left, initial encounter 10/06/2017   Lumbar pain 2011   Morbid obesity ([place])    Nephrolithiasis    Pneumonia    Recurrent boils    Sleep apnea    Torn rotator cuff RT SHOULDER   Tremor CCENTRAL NERVOUS SYSTEM     ROS See HPI.    Objective:     BP (!) 104/54   Pulse 61   Ht '5\' 3"'$  (1.6 m)   Wt 257 lb (116.6 kg)   LMP 10/18/1995   SpO2 100%   BMI 45.53 kg/m  BP Readings from Last 3 Encounters:  11/04/22 (!) 104/54  07/13/22 (!) 157/52  04/12/22 106/84   Wt Readings from Last 3 Encounters:  11/04/22 257 lb (116.6 kg)  04/12/22 261 lb (118.4 kg)  06/28/21 257 lb (116.6 kg)      Physical Exam Constitutional:      Appearance: Normal appearance. She is obese.     Comments: Not able to ambulate the entire way  to exam room without sitting and needing wheelchair  Cardiovascular:     Rate and Rhythm: Normal rate and regular rhythm.  Pulmonary:     Effort: Pulmonary effort is normal.     Breath sounds: Normal breath sounds.  Neurological:     General: No focal deficit present.     Mental Status: She is alert and oriented to person, place, and time.  Psychiatric:        Mood and Affect: Mood normal.       Results for orders placed or performed in visit on 11/04/22  Hemoglobin A1c  Result Value Ref Range   Hgb A1c MFr Bld 6.9 (H) <5.7 % of total Hgb   Mean Plasma Glucose 151 mg/dL   eAG (mmol/L) 8.4 mmol/L  POCT glycosylated hemoglobin (Hb A1C)  Result Value Ref Range   Hemoglobin A1C 7.7 (A) 4.0 - 5.6 %   HbA1c POC (<> result, manual entry)     HbA1c, POC (prediabetic range)     HbA1c, POC (controlled diabetic range)      The 10-year ASCVD risk score (Arnett DK, et al., 2019) is: 12.8%    Assessment & Plan:  Marland KitchenMarland KitchenBrooke was seen today for follow-up and  diabetes.  Diagnoses and all orders for this visit:  Type 2 diabetes mellitus with diabetic neuropathy, with long-term current use of insulin (HCC) -     POCT glycosylated hemoglobin (Hb A1C) -     gabapentin (NEURONTIN) 800 MG tablet; Take 1 tablet (800 mg total) by mouth 3 (three) times daily. -     insulin glargine, 2 Unit Dial, (TOUJEO MAX SOLOSTAR) 300 UNIT/ML Solostar Pen; Inject 40 Units into the skin at bedtime. -     glucose blood (TRUE METRIX BLOOD GLUCOSE TEST) test strip; Use as instructed -     Hemoglobin A1c -     Semaglutide, 1 MG/DOSE, 4 MG/3ML SOPN; Inject 1 mg as directed once a week.  Encounter for immunization [employer] Fall 2023 Covid-19 Vaccine 57yr and older  Diabetic peripheral neuropathy ([place] -     DULoxetine (CYMBALTA) 60 MG capsule; Take 2 capsules (120 mg total) by mouth daily.  SOB (shortness of breath) on exertion -     furosemide (LASIX) 20 MG tablet; TAKE 1 TABLET TWICE DAILY  Hypertension associated with diabetes (HCC) -     losartan (COZAAR) 25 MG tablet; TAKE 1 TABLET BY MOUTH DAILY GENERIC EQUIVALENT FOR COZAAR  Type 2 diabetes, controlled, with peripheral neuropathy (HCC) -     losartan (COZAAR) 25 MG tablet; TAKE 1 TABLET BY MOUTH DAILY GENERIC EQUIVALENT FOR COZAAR  Intertrigo -     nystatin ointment (MYCOSTATIN); APPLY TO AFFECTED AREA TWICE A DAY  Chronic pain syndrome -     HYDROcodone-acetaminophen (NORCO) 10-325 MG tablet; Take 1 tablet by mouth every 12 (twelve) hours. -     cyclobenzaprine (FLEXERIL) 10 MG tablet; Take 0.5-1 tablets (5-10 mg total) by mouth 3 (three) times daily as needed for muscle spasms. Caution: can cause drowsiness   A1C POCt was elevated will confirm with blood draw since home glucose testings have been really good Continue same medications Stop tresbia and start toujeo BP to goal, on ARB On statin On gabapentin for neuropathy Vaccines UTD Foot exam UTD Follow up in 3 months  Pain contract  UTD ..Marland KitchenDMP reviewed during this encounter. No concerns Norco refilled Follow up in 3 months.    Return in about 3 months (around  02/03/2023).    Iran Planas, PA-C

## 2022-11-14 DIAGNOSIS — E1159 Type 2 diabetes mellitus with other circulatory complications: Secondary | ICD-10-CM | POA: Diagnosis not present

## 2022-11-14 DIAGNOSIS — I152 Hypertension secondary to endocrine disorders: Secondary | ICD-10-CM | POA: Diagnosis not present

## 2022-11-14 DIAGNOSIS — K3184 Gastroparesis: Secondary | ICD-10-CM | POA: Diagnosis not present

## 2022-11-14 DIAGNOSIS — K219 Gastro-esophageal reflux disease without esophagitis: Secondary | ICD-10-CM | POA: Diagnosis not present

## 2022-11-14 DIAGNOSIS — J449 Chronic obstructive pulmonary disease, unspecified: Secondary | ICD-10-CM | POA: Diagnosis not present

## 2022-11-14 DIAGNOSIS — F419 Anxiety disorder, unspecified: Secondary | ICD-10-CM | POA: Diagnosis not present

## 2022-11-14 DIAGNOSIS — K5900 Constipation, unspecified: Secondary | ICD-10-CM | POA: Diagnosis not present

## 2022-11-14 DIAGNOSIS — F32A Depression, unspecified: Secondary | ICD-10-CM | POA: Diagnosis not present

## 2022-11-14 DIAGNOSIS — E1122 Type 2 diabetes mellitus with diabetic chronic kidney disease: Secondary | ICD-10-CM | POA: Diagnosis not present

## 2022-11-14 DIAGNOSIS — G8929 Other chronic pain: Secondary | ICD-10-CM | POA: Diagnosis not present

## 2022-11-14 DIAGNOSIS — G4733 Obstructive sleep apnea (adult) (pediatric): Secondary | ICD-10-CM | POA: Diagnosis not present

## 2022-11-14 DIAGNOSIS — A0472 Enterocolitis due to Clostridium difficile, not specified as recurrent: Secondary | ICD-10-CM | POA: Diagnosis not present

## 2022-11-14 DIAGNOSIS — M5030 Other cervical disc degeneration, unspecified cervical region: Secondary | ICD-10-CM | POA: Diagnosis not present

## 2022-11-14 DIAGNOSIS — E782 Mixed hyperlipidemia: Secondary | ICD-10-CM | POA: Diagnosis not present

## 2022-11-14 DIAGNOSIS — E1143 Type 2 diabetes mellitus with diabetic autonomic (poly)neuropathy: Secondary | ICD-10-CM | POA: Diagnosis not present

## 2022-11-14 DIAGNOSIS — N183 Chronic kidney disease, stage 3 unspecified: Secondary | ICD-10-CM | POA: Diagnosis not present

## 2022-11-18 DIAGNOSIS — G5702 Lesion of sciatic nerve, left lower limb: Secondary | ICD-10-CM | POA: Diagnosis not present

## 2022-11-24 DIAGNOSIS — E119 Type 2 diabetes mellitus without complications: Secondary | ICD-10-CM | POA: Diagnosis not present

## 2022-11-24 DIAGNOSIS — H43393 Other vitreous opacities, bilateral: Secondary | ICD-10-CM | POA: Diagnosis not present

## 2022-11-24 DIAGNOSIS — H43821 Vitreomacular adhesion, right eye: Secondary | ICD-10-CM | POA: Diagnosis not present

## 2022-11-24 DIAGNOSIS — H34831 Tributary (branch) retinal vein occlusion, right eye, with macular edema: Secondary | ICD-10-CM | POA: Diagnosis not present

## 2022-11-24 LAB — HM DIABETES EYE EXAM

## 2022-12-01 ENCOUNTER — Encounter: Payer: Self-pay | Admitting: Physician Assistant

## 2022-12-02 ENCOUNTER — Telehealth: Payer: Self-pay | Admitting: General Practice

## 2022-12-02 ENCOUNTER — Ambulatory Visit (INDEPENDENT_AMBULATORY_CARE_PROVIDER_SITE_OTHER): Payer: Medicare Other | Admitting: Physician Assistant

## 2022-12-02 DIAGNOSIS — J449 Chronic obstructive pulmonary disease, unspecified: Secondary | ICD-10-CM | POA: Diagnosis not present

## 2022-12-02 DIAGNOSIS — G4733 Obstructive sleep apnea (adult) (pediatric): Secondary | ICD-10-CM | POA: Diagnosis not present

## 2022-12-02 DIAGNOSIS — Z1231 Encounter for screening mammogram for malignant neoplasm of breast: Secondary | ICD-10-CM

## 2022-12-02 DIAGNOSIS — Z0000 Encounter for general adult medical examination without abnormal findings: Secondary | ICD-10-CM

## 2022-12-02 MED ORDER — LANCETS MISC. MISC: 1.0000 | Freq: Three times a day (TID) | 0 refills | Status: AC

## 2022-12-02 MED ORDER — BLOOD GLUCOSE TEST VI STRP: 1.0000 | ORAL_STRIP | Freq: Three times a day (TID) | 0 refills | Status: AC

## 2022-12-02 MED ORDER — BLOOD GLUCOSE MONITORING SUPPL DEVI: 1.0000 | Freq: Three times a day (TID) | 0 refills | Status: DC

## 2022-12-02 MED ORDER — LANCET DEVICE MISC: 1.0000 | Freq: Three times a day (TID) | 0 refills | Status: AC

## 2022-12-02 NOTE — Progress Notes (Signed)
MEDICARE ANNUAL WELLNESS VISIT  12/02/2022  Telephone Visit Disclaimer This Medicare AWV was conducted by telephone due to national recommendations for restrictions regarding the COVID-19 Pandemic (e.g. social distancing).  I verified, using two identifiers, that I am speaking with [name] Rice or their authorized healthcare agent. I discussed the limitations, risks, security, and privacy concerns of performing an evaluation and management service by telephone and the potential availability of an in-person appointment in the future. The patient expressed understanding and agreed to proceed.  Location of Patient: Home Location of Provider (nurse):  In the office.  Subjective:    [name] Rice is a 67 y.o. female patient of Alden Hipp, Royetta Car, PA-C who had a Medicare Annual Wellness Visit today via telephone. [name] Rice is Retired and lives with their daughter. she has 1 child. she reports that she is socially active and does interact with friends/family regularly. she is minimally physically active and enjoys jigsaw puzzles.  Patient Care Team: Lavada Mesi as PCP - General (Family Medicine) Plyler, Chauncey Reading, RD as Dietitian (Dietician) Clent Jacks, MD as Consulting Physician (Ophthalmology) Dominic Pea, DO as Referring Physician (Internal Medicine)     12/02/2022    9:35 AM 03/02/2020    1:11 PM 06/27/2019   11:51 AM 02/22/2016    2:42 PM 03/31/2013   10:35 PM 09/15/2012    6:23 AM 06/28/2012   10:12 AM  Advanced Directives  Does Patient Have a Medical Advance Directive? Yes No No No Patient does not have advance directive Patient does not have advance directive;Patient would not like information Patient does not have advance directive;Patient would not like information  Type of Advance Directive Living will        Does patient want to make changes to medical advance directive? No - Patient declined        Would patient like information on creating a medical advance  directive?  No - Patient declined No - Patient declined No - patient declined information     Pre-existing out of facility DNR order (yellow form or pink MOST form)     No  No    Hospital Utilization Over the Past 12 Months: # of hospitalizations or ER visits: 1 # of surgeries: 0  Review of Systems    Patient reports that her overall health is better compared to last year.  History obtained from chart review and the patient  Patient Reported Readings (BP, Pulse, CBG, Weight, etc) none  Pain Assessment Pain : 0-10 Pain Score: 4  Pain Type: Chronic pain     Current Medications & Allergies (verified) Allergies as of 12/02/2022       Reactions   Ace Inhibitors Other (See Comments)   Causes blood pressure to drop rapidly   Atrovent [ipratropium] Swelling   Throat swelling on Atorvent Nasal Spray   Cefaclor Hives, Itching   Glyxambi [empagliflozin-linagliptin]    Itching/rash   Shrimp [shellfish Allergy] Nausea And Vomiting   Farxiga [dapagliflozin]    Itching/rash   Metformin And Related    GI upset   Wellbutrin [bupropion]    nightmares   Ceclor [cefaclor]    Sulfa Antibiotics    Sulfonamide Derivatives Rash        Medication List        Accurate as of December 02, 2022 10:28 AM. If you have any questions, ask your nurse or doctor.          azelastine 0.05 % ophthalmic solution Commonly known  as: OPTIVAR INSTILL 1 DROP INTO BOTH EYES TWICE DAILY   B-D SINGLE USE SWABS REGULAR Pads 1 Device by Does not apply route 4 (four) times daily as needed.   blood glucose meter kit and supplies 1 each by Other route in the morning, at noon, in the evening, and at bedtime. [employer] True Meter Air Meter Use up to four times daily as directed. (FOR ICD-10 E10.9, E11.9).   cyanocobalamin 1000 MCG tablet Commonly known as: VITAMIN B12 Take 1 tablet (1,000 mcg total) by mouth daily.   cyclobenzaprine 10 MG tablet Commonly known as: FLEXERIL Take 0.5-1 tablets (5-10 mg  total) by mouth 3 (three) times daily as needed for muscle spasms. Caution: can cause drowsiness   Droplet Pen Needles 32G X 6 MM Misc Generic drug: Insulin Pen Needle USE THREE TIMES DAILY  WITH  NOVOLOG   DULoxetine 60 MG capsule Commonly known as: CYMBALTA Take 2 capsules (120 mg total) by mouth daily.   famotidine 20 MG tablet Commonly known as: PEPCID Take 1 tablet (20 mg total) by mouth 2 (two) times daily.   fluticasone 50 MCG/ACT nasal spray Commonly known as: FLONASE SHAKE LIQUID AND USE 2 SPRAYS IN EACH NOSTRIL DAILY   furosemide 20 MG tablet Commonly known as: LASIX TAKE 1 TABLET TWICE DAILY   gabapentin 800 MG tablet Commonly known as: NEURONTIN Take 1 tablet (800 mg total) by mouth 3 (three) times daily.   HYDROcodone-acetaminophen 10-325 MG tablet Commonly known as: NORCO Take 1 tablet by mouth every 12 (twelve) hours.   hydrOXYzine 50 MG tablet Commonly known as: ATARAX Take 1 tablet (50 mg total) by mouth every 8 (eight) hours as needed. For itching.   levocetirizine 5 MG tablet Commonly known as: XYZAL TAKE 1 TABLET(5 MG) BY MOUTH EVERY EVENING   losartan 25 MG tablet Commonly known as: COZAAR TAKE 1 TABLET BY MOUTH DAILY GENERIC EQUIVALENT FOR COZAAR   meclizine 25 MG tablet Commonly known as: ANTIVERT Take 1 tablet (25 mg total) by mouth 3 (three) times daily as needed for dizziness.   metoCLOPramide 10 MG tablet Commonly known as: REGLAN Take 0.5 tablets (5 mg total) by mouth 3 (three) times daily before meals.   metroNIDAZOLE 1 % gel Commonly known as: METROGEL Apply topically daily.   mirabegron ER 25 MG Tb24 tablet Commonly known as: Myrbetriq Take 1 tablet (25 mg total) by mouth daily.   montelukast 10 MG tablet Commonly known as: SINGULAIR TAKE 1 TABLET(10 MG) BY MOUTH AT BEDTIME   nystatin ointment Commonly known as: MYCOSTATIN APPLY TO AFFECTED AREA TWICE A DAY   pantoprazole 40 MG tablet Commonly known as: PROTONIX Take 1  tablet (40 mg total) by mouth daily.   pioglitazone 30 MG tablet Commonly known as: ACTOS TAKE 1 TABLET EVERY DAY   pravastatin 10 MG tablet Commonly known as: PRAVACHOL Take 1 tablet (10 mg total) by mouth daily.   propranolol 20 MG tablet Commonly known as: INDERAL TAKE 1TABLET BY MOUTH TWICE DAILY   Semaglutide (1 MG/DOSE) 4 MG/3ML Sopn Inject 1 mg as directed once a week.   tobramycin 0.3 % ophthalmic solution Commonly known as: TOBREX Place 1 drop into the right eye 4 (four) times daily.   Toujeo Max SoloStar 300 UNIT/ML Solostar Pen Generic drug: insulin glargine (2 Unit Dial) Inject 40 Units into the skin at bedtime.   True Metrix Blood Glucose Test test strip Generic drug: glucose blood Use as instructed   True Metrix Level 1  Low Soln 1 each by In Vitro route daily as needed.   True Metrix Meter Devi 1 Device by Does not apply route daily.   TRUEdraw Lancing Device Misc 1 each by Does not apply route 4 (four) times daily as needed.   TRUEplus Lancets 33G Misc TEST BLOOD SUGAR  UP  TO FOUR TIMES DAILY   Vitamin D 50 MCG (2000 UT) Caps Take 2,000 Units by mouth daily.   Wheelchair Cushion Misc 1 Device by Does not apply route daily.        History (reviewed): Past Medical History:  Diagnosis Date   Allergy    Always allergic to cut grass   Anxiety    Arthritis    Asthma    no PFTs   Carpal tunnel syndrome, bilateral    Cataract 2020   Removed   Cervical dystonia    COPD (chronic obstructive pulmonary disease) (HCC)    no PFTs   Depression    Diabetes mellitus    Diarrhea    Diverticulitis    Diverticulosis    Dizziness    Gastroparesis    GERD (gastroesophageal reflux disease)    History of colonic polyps    hyperplastic   History of kidney stones    History of seborrheic dermatitis    face,ears   HLD (hyperlipidemia)    Hypertension    Intussusception ([place])    Lisfranc's sprain, left, initial encounter 10/06/2017   Lumbar pain  2011   per x-ray - Multilevel spondylosis   Morbid obesity ([place])    Nephrolithiasis    OSA on CPAP 2008   Osteoporosis 2018   Pneumonia    Recurrent boils    "SLUSTER BOILS" ON ABD AND LABIA   Sleep apnea    USES C-PAP   Torn rotator cuff RT SHOULDER   Tremor CCENTRAL NERVOUS SYSTEM   Past Surgical History:  Procedure Laterality Date   CARPAL TUNNEL RELEASE Left 2006   ELBOW SURGERY Left    EYE SURGERY  2020   HAND SURGERY Right RIGHT   KNEE ARTHROSCOPY Left    LUMBAR LAMINECTOMY     MASS EXCISION  06/28/2012   Procedure: EXCISION MASS;  Surgeon: Harl Bowie, MD;  Location: [facility];  Service: General;  Laterality: Left;  excision left upper quadrant abdominal wall mass, Benign Lipoma   NASAL SINUS SURGERY     PILONIDAL CYST EXCISION  MR:[id number]   SUBACROMIAL DECOMPRESSION Right    TUBAL LIGATION     Family History  Problem Relation Age of Onset   Heart disease Brother    Heart attack Brother    Depression Brother    Diabetes Brother    Hypertension Brother    Hyperlipidemia Brother    Diabetes Mother    Hyperlipidemia Mother    Hypertension Mother    Kidney disease Mother    Depression Sister    Diabetes Sister    Hyperlipidemia Sister    Hypertension Sister    Stroke Sister    Vision loss Sister    Heart attack Brother    Depression Brother    Diabetes Brother    Hyperlipidemia Brother    Hypertension Brother    Kidney cancer Brother    Cancer Neg Hx    Social History   Socioeconomic History   Marital status: Divorced    Spouse name: Not on file   Number of children: 1   Years of education: 12   Highest education level: 12th grade  Occupational History   Occupation: disabled  Tobacco Use   Smoking status: Former    Packs/day: 0.50    Years: 30.00    Total pack years: 15.00    Types: Cigarettes    Quit date: 10/17/1996    Years since quitting: 26.1   Smokeless tobacco: Never  Vaping Use   Vaping Use: Never used  Substance and Sexual  Activity   Alcohol use: No   Drug use: No   Sexual activity: Not Currently    Birth control/protection: Surgical  Other Topics Concern   Not on file  Social History Narrative   Lives with her daughter in [place]. She has one daughter. She enjoys jigsaw puzzles.   Social Determinants of Health   Financial Resource Strain: High Risk (11/29/2022)   Overall Financial Resource Strain (CARDIA)    Difficulty of Paying Living Expenses: Hard  Food Insecurity: Food Insecurity Present (11/29/2022)   Hunger Vital Sign    Worried About Running Out of Food in the Last Year: Sometimes true    Ran Out of Food in the Last Year: Often true  Transportation Needs: Unmet Transportation Needs (11/29/2022)   PRAPARE - Hydrologist (Medical): Yes    Lack of Transportation (Non-Medical): No  Physical Activity: Insufficiently Active (11/29/2022)   Exercise Vital Sign    Days of Exercise per Week: 1 day    Minutes of Exercise per Session: 20 min  Stress: Stress Concern Present (11/29/2022)   [place]    Feeling of Stress : To some extent  Social Connections: Socially Isolated (12/02/2022)   Social Connection and Isolation Panel [NHANES]    Frequency of Communication with Friends and Family: More than three times a week    Frequency of Social Gatherings with Friends and Family: More than three times a week    Attends Religious Services: Never    Marine scientist or Organizations: No    Attends Archivist Meetings: Never    Marital Status: Divorced    Activities of Daily Living    12/02/2022    9:36 AM 11/29/2022    9:36 AM  In your present state of health, do you have any difficulty performing the following activities:  Hearing?  0  Vision?  1  Difficulty concentrating or making decisions?  1  Walking or climbing stairs?  1  Dressing or bathing?  1  Doing errands, shopping?  1   Preparing Food and eating ?  N  Using the Toilet?  N  In the past six months, have you accidently leaked urine?  Y  Do you have problems with loss of bowel control?  Y  Managing your Medications?  N  Managing your Finances?  Y  Comment her daughter helps   Housekeeping or managing your Housekeeping?  Y    Patient Education/ Literacy How often do you need to have someone help you when you read instructions, pamphlets, or other written materials from your doctor or pharmacy?: 1 - Never What is the last grade level you completed in school?: 12th grade  Exercise Current Exercise Habits: The patient does not participate in regular exercise at present, Exercise limited by: orthopedic condition(s)  Diet Patient reports consuming 2 meals a day and 1 snack(s) a day Patient reports that her primary diet is: Regular Patient reports that she does have regular access to food.   Depression Screen    12/02/2022  9:41 AM 11/04/2022    9:54 AM 07/13/2022    9:01 AM 03/22/2021    3:56 PM 04/03/2020    9:01 AM 03/02/2020    1:14 PM 09/23/2019    4:19 PM  PHQ 2/9 Scores  PHQ - 2 Score 3 3 3 4 2 1 2  $ PHQ- 9 Score 12 14 15 19 16  10     $ Fall Risk    12/02/2022    9:40 AM 11/29/2022    9:36 AM 11/04/2022    9:53 AM 07/13/2022    9:01 AM 07/13/2022    8:39 AM  Fall Risk   Falls in the past year? 0 0 0 0 0  Number falls in past yr: 0 0 0 0 0  Injury with Fall? 0 0 0 0 0  Risk for fall due to : No Fall Risks  No Fall Risks No Fall Risks No Fall Risks  Follow up Falls evaluation completed  Falls evaluation completed Falls evaluation completed Falls evaluation completed     Objective:  [name] Rice seemed alert and oriented and she participated appropriately during our telephone visit.  Blood Pressure Weight BMI  BP Readings from Last 3 Encounters:  11/04/22 (!) 104/54  07/13/22 (!) 157/52  04/12/22 106/84   Wt Readings from Last 3 Encounters:  11/04/22 257 lb (116.6 kg)  04/12/22 261 lb  (118.4 kg)  06/28/21 257 lb (116.6 kg)   BMI Readings from Last 1 Encounters:  11/04/22 45.53 kg/m    *Unable to obtain current vital signs, weight, and BMI due to telephone visit type  Hearing/Vision  Vaughan Basta did not seem to have difficulty with hearing/understanding during the telephone conversation Reports that she has had a formal eye exam by an eye care professional within the past year Reports that she has not had a formal hearing evaluation within the past year *Unable to fully assess hearing and vision during telephone visit type  Cognitive Function:    12/02/2022    9:44 AM  6CIT Screen  What Year? 0 points  What month? 0 points  What time? 0 points  Count back from 20 0 points  Months in reverse 0 points  Repeat phrase 0 points  Total Score 0 points   (Normal:0-7, Significant for Dysfunction: >8)  Normal Cognitive Function Screening: Yes   Immunization & Health Maintenance Record Immunization History  Administered Date(s) Administered   COVID-19, mRNA, vaccine(Comirnaty)12 years and older 11/04/2022   Fluad Quad(high Dose 65+) 06/30/2020, 06/28/2021, 07/13/2022   Influenza Split 08/08/2011   Influenza Whole 07/21/2008, 08/06/2009, 09/07/2010   Influenza,inj,Quad PF,6+ Mos 08/21/2013, 06/17/2015, 06/21/2016, 07/11/2017, 07/13/2018, 06/18/2019   Influenza,inj,quad, With Preservative 08/21/2013, 06/17/2015, 06/21/2016, 07/11/2017, 07/13/2018, 06/18/2019   Influenza-Unspecified 06/19/2014   PFIZER(Purple Top)SARS-COV-2 Vaccination 12/15/2019, 01/14/2020   Pneumococcal Conjugate-13 06/17/2015   Pneumococcal Polysaccharide-23 02/11/2009, 06/18/2019   Tdap 04/04/2011, 07/13/2022   Zoster Recombinat (Shingrix) 03/23/2019, 08/01/2019   Zoster, Live 06/19/2014    Health Maintenance  Topic Date Due   Diabetic kidney evaluation - Urine ACR  12/03/2022 (Originally 06/17/2020)   COVID-19 Vaccine (4 - 2023-24 season) 12/30/2022   HEMOGLOBIN A1C  02/03/2023   FOOT EXAM   04/13/2023   Diabetic kidney evaluation - eGFR measurement  05/19/2023   OPHTHALMOLOGY EXAM  11/25/2023   Medicare Annual Wellness (AWV)  12/03/2023   MAMMOGRAM  12/21/2023   COLONOSCOPY (Pts 45-29yr Insurance coverage will need to be confirmed)  10/06/2029   DTaP/Tdap/Td (3 - Td or Tdap) 07/13/2032  Pneumonia Vaccine 28+ Years old  Completed   INFLUENZA VACCINE  Completed   DEXA SCAN  Completed   Hepatitis C Screening  Completed   Zoster Vaccines- Shingrix  Completed   HPV VACCINES  Aged Out       Assessment  This is a routine wellness examination for ENDIYAH MCKAGUE.  Health Maintenance: Due or Overdue There are no preventive care reminders to display for this patient.   [name] Rice does not need a referral for Community Assistance: Care Management:   no Social Work:    no Prescription Assistance:  no Nutrition/Diabetes Education:  no   Plan:  Personalized Goals  Goals Addressed               This Visit's Progress     Patient Stated (pt-stated)        Patient would like to being able to with the walker again.       Personalized Health Maintenance & Screening Recommendations  Screening mammography Urine ACR  Lung Cancer Screening Recommended: no (Low Dose CT Chest recommended if Age 42-80 years, 30 pack-year currently smoking OR have quit w/in past 15 years) Hepatitis C Screening recommended: no HIV Screening recommended: no  Advanced Directives: Written information was not prepared per patient's request.  Referrals & Orders Orders Placed This Encounter  Procedures   Mammogram 3D SCREEN BREAST BILATERAL   AMB Referral to [facility] (ACO Patients)    Follow-up Plan Follow-up with Donella Stade, PA-C as planned Medicare wellness visit in one year. Patient will access AVS on my chart.   I have personally reviewed and noted the following in the patient's chart:   Medical and social history Use of alcohol, tobacco or illicit  drugs  Current medications and supplements Functional ability and status Nutritional status Physical activity Advanced directives List of other physicians Hospitalizations, surgeries, and ER visits in previous 12 months Vitals Screenings to include cognitive, depression, and falls Referrals and appointments  In addition, I have reviewed and discussed with [name] Rice certain preventive protocols, quality metrics, and best practice recommendations. A written personalized care plan for preventive services as well as general preventive health recommendations is available and can be mailed to the patient at her request.      Tinnie Gens, RN BSN  12/02/2022

## 2022-12-02 NOTE — Addendum Note (Signed)
Addended byAnnamaria Helling on: 12/02/2022 10:40 AM   Modules accepted: Orders

## 2022-12-02 NOTE — Telephone Encounter (Signed)
Glucose meter/supplies sent to pharmacy.

## 2022-12-02 NOTE — Patient Instructions (Addendum)
  [place] Maintenance Summary and Written Plan of Care  [name] Rice ,  Thank you for allowing me to perform your Medicare Annual Wellness Visit and for your ongoing commitment to your health.   Health Maintenance & Immunization History Health Maintenance  Topic Date Due   Diabetic kidney evaluation - Urine ACR  12/03/2022 (Originally 06/17/2020)   COVID-19 Vaccine (4 - 2023-24 season) 12/30/2022   HEMOGLOBIN A1C  02/03/2023   FOOT EXAM  04/13/2023   Diabetic kidney evaluation - eGFR measurement  05/19/2023   OPHTHALMOLOGY EXAM  11/25/2023   Medicare Annual Wellness (AWV)  12/03/2023   MAMMOGRAM  12/21/2023   COLONOSCOPY (Pts 45-46yr Insurance coverage will need to be confirmed)  10/06/2029   DTaP/Tdap/Td (3 - Td or Tdap) 07/13/2032   Pneumonia Vaccine 69 Years old  Completed   INFLUENZA VACCINE  Completed   DEXA SCAN  Completed   Hepatitis C Screening  Completed   Zoster Vaccines- Shingrix  Completed   HPV VACCINES  Aged Out   Immunization History  Administered Date(s) Administered   COVID-19, mRNA, vaccine(Comirnaty)12 years and older 11/04/2022   Fluad Quad(high Dose 65+) 06/30/2020, 06/28/2021, 07/13/2022   Influenza Split 08/08/2011   Influenza Whole 07/21/2008, 08/06/2009, 09/07/2010   Influenza,inj,Quad PF,6+ Mos 08/21/2013, 06/17/2015, 06/21/2016, 07/11/2017, 07/13/2018, 06/18/2019   Influenza,inj,quad, With Preservative 08/21/2013, 06/17/2015, 06/21/2016, 07/11/2017, 07/13/2018, 06/18/2019   Influenza-Unspecified 06/19/2014   PFIZER(Purple Top)SARS-COV-2 Vaccination 12/15/2019, 01/14/2020   Pneumococcal Conjugate-13 06/17/2015   Pneumococcal Polysaccharide-23 02/11/2009, 06/18/2019   Tdap 04/04/2011, 07/13/2022   Zoster Recombinat (Shingrix) 03/23/2019, 08/01/2019   Zoster, Live 06/19/2014    These are the patient goals that we discussed:  Goals Addressed               This Visit's Progress     Patient Stated (pt-stated)         Patient would like to being able to with the walker again.         This is a list of Health Maintenance Items that are overdue or due now:  Screening mammography Urine ACR  Orders/Referrals Placed Today: Orders Placed This Encounter  Procedures   Mammogram 3D SCREEN BREAST BILATERAL    Standing Status:   Future    Standing Expiration Date:   12/03/2023    Scheduling Instructions:     Last mammogram 12/20/21. Please call patient to schedule.    Order Specific Question:   Reason for Exam (SYMPTOM  OR DIAGNOSIS REQUIRED)    Answer:   breast cancer screening    Order Specific Question:   Preferred imaging location?    [facility]  AMB Referral to [place]([facility] Patients)    Referral Priority:   Routine    Referral Type:   Consultation    Referral Reason:   Care Coordination    Number of Visits Requested:   1   (Contact our referral department at [phone] you have not spoken with someone about your referral appointment within the next 5 days)    Follow-up Plan Follow-up with BDonella Stade PA-C as planned Medicare wellness visit in one year. Patient will access AVS on my chart.

## 2022-12-02 NOTE — Telephone Encounter (Signed)
Patient stated that her glucose strips or meter are not covered by her insurance anymore. They prefer that she gets a new meter and strips Brand Name: One touch.   Please send the prescription to pharmacy. [facility] on [address] [place], [place]

## 2022-12-07 ENCOUNTER — Telehealth: Payer: Self-pay | Admitting: *Deleted

## 2022-12-07 NOTE — Telephone Encounter (Signed)
   Telephone encounter was:  Unsuccessful.  12/07/2022 Name: AMAL CLOWES MRN: [id number] DOB: [birth year]  Unsuccessful outbound call made today to assist with:  Transportation Needs   Outreach Attempt:  1st Attempt  A HIPAA compliant voice message was left requesting a return call.  Instructed patient to call back at [phone].  [facility] [phone] [address] , [place] [address] Email : [email or web address]

## 2022-12-12 ENCOUNTER — Other Ambulatory Visit: Payer: Self-pay | Admitting: Physician Assistant

## 2022-12-12 DIAGNOSIS — H1013 Acute atopic conjunctivitis, bilateral: Secondary | ICD-10-CM

## 2022-12-13 ENCOUNTER — Other Ambulatory Visit: Payer: Self-pay | Admitting: Physician Assistant

## 2022-12-13 DIAGNOSIS — G894 Chronic pain syndrome: Secondary | ICD-10-CM

## 2022-12-13 DIAGNOSIS — K219 Gastro-esophageal reflux disease without esophagitis: Secondary | ICD-10-CM

## 2022-12-13 DIAGNOSIS — E1343 Other specified diabetes mellitus with diabetic autonomic (poly)neuropathy: Secondary | ICD-10-CM

## 2022-12-15 ENCOUNTER — Telehealth: Payer: Self-pay | Admitting: *Deleted

## 2022-12-15 NOTE — Telephone Encounter (Signed)
   Telephone encounter was:  Unsuccessful.  12/15/2022 Name: [name] Rice MRN: [id number] DOB: 29-May-1956  Unsuccessful outbound call made today to assist with:  Transportation Needs  and Food Insecurity  Outreach Attempt:  2nd Attempt  A HIPAA compliant voice message was left requesting a return call.  Instructed patient to call back at [phone]. [place] [phone] [address] , [place] [address] Email : [email or web address]

## 2022-12-16 ENCOUNTER — Other Ambulatory Visit: Payer: Self-pay | Admitting: Neurology

## 2022-12-16 ENCOUNTER — Telehealth: Payer: Self-pay | Admitting: *Deleted

## 2022-12-16 DIAGNOSIS — I10 Essential (primary) hypertension: Secondary | ICD-10-CM

## 2022-12-16 MED ORDER — PROPRANOLOL HCL 20 MG PO TABS: ORAL_TABLET | ORAL | 1 refills | Status: DC

## 2022-12-16 NOTE — Telephone Encounter (Signed)
   Telephone encounter was:  Successful.  12/16/2022 Name: [name] Rice MRN: [id number] DOB: [birth year]  [name] Rice is a 67 y.o. year old female who is a primary care patient of Donella Stade, [place] . The community resource team was consulted for assistance with Transportation Needs  and [place] guide performed the following interventions: Patient provided with information about care guide support team and interviewed to confirm resource needs. Concierge number is [phone] if you run out of rides through [facility] and also offered food banks patient said she was aware of them  Follow Up Plan:  No further follow up planned at this time. The patient has been provided with needed resources.  [place] [phone] [address] , [place] [address] Email : [email or web address]

## 2022-12-31 DIAGNOSIS — G4733 Obstructive sleep apnea (adult) (pediatric): Secondary | ICD-10-CM | POA: Diagnosis not present

## 2022-12-31 DIAGNOSIS — J449 Chronic obstructive pulmonary disease, unspecified: Secondary | ICD-10-CM | POA: Diagnosis not present

## 2023-01-03 DIAGNOSIS — G629 Polyneuropathy, unspecified: Secondary | ICD-10-CM | POA: Diagnosis not present

## 2023-01-04 ENCOUNTER — Other Ambulatory Visit: Payer: Self-pay | Admitting: Physician Assistant

## 2023-01-04 DIAGNOSIS — G894 Chronic pain syndrome: Secondary | ICD-10-CM | POA: Diagnosis not present

## 2023-01-04 DIAGNOSIS — E1140 Type 2 diabetes mellitus with diabetic neuropathy, unspecified: Secondary | ICD-10-CM

## 2023-01-04 DIAGNOSIS — G5702 Lesion of sciatic nerve, left lower limb: Secondary | ICD-10-CM | POA: Diagnosis not present

## 2023-01-06 ENCOUNTER — Encounter: Payer: Self-pay | Admitting: Family Medicine

## 2023-01-06 ENCOUNTER — Telehealth: Payer: Self-pay

## 2023-01-06 ENCOUNTER — Ambulatory Visit (INDEPENDENT_AMBULATORY_CARE_PROVIDER_SITE_OTHER): Payer: Medicare Other | Admitting: Family Medicine

## 2023-01-06 VITALS — BP 90/58 | HR 84 | Temp 97.6°F | Resp 16 | Ht 63.0 in | Wt 260.0 lb

## 2023-01-06 DIAGNOSIS — L03818 Cellulitis of other sites: Secondary | ICD-10-CM

## 2023-01-06 DIAGNOSIS — B369 Superficial mycosis, unspecified: Secondary | ICD-10-CM | POA: Insufficient documentation

## 2023-01-06 DIAGNOSIS — L0390 Cellulitis, unspecified: Secondary | ICD-10-CM | POA: Insufficient documentation

## 2023-01-06 MED ORDER — DOXYCYCLINE HYCLATE 100 MG PO TABS: 100.0000 mg | ORAL_TABLET | Freq: Two times a day (BID) | ORAL | 0 refills | Status: DC

## 2023-01-06 MED ORDER — KETOCONAZOLE 2 % EX CREA: 1.0000 | TOPICAL_CREAM | Freq: Every day | CUTANEOUS | 1 refills | Status: DC

## 2023-01-06 NOTE — Patient Instructions (Signed)
You were prescribed an antibiotic today. Please complete the full course.  Acetaminophen or ibuprofen for fever according to package instructions, do not exceed recommended doses.  Adequate fluids to avoid dehydration. Lots of rest while you are recovering.  Follow-up if your symptoms do not improve.     

## 2023-01-06 NOTE — Telephone Encounter (Signed)
Patient informed and scheduled for visit with Santiago Glad dolby at 11:10 am today at [facility].

## 2023-01-06 NOTE — Telephone Encounter (Signed)
Forwarding message to Dr. Madilyn Fireman covering [name] Rice.   Patient states she has a rash in skin folds of abdomen.  She states has history of yeast infections in skin folds.  She states that  she has used  nystatin lotion ( not powder ) without relief  She is requesting a prescription for a diflucan to see if this will help

## 2023-01-06 NOTE — Progress Notes (Signed)
   Established Patient Office Visit  Subjective   Patient ID: [name] Rice, female    DOB: 1956/10/02  Age: 67 y.o. MRN: [id number]  Chief Complaint  Patient presents with   Rash    Rash has been not responding to medication ,patient states that she may need new medication , she states that her daughter let her use Clindamycin and it seemed to work, Daughter also states rash looks like a blister is forming    HPI Presents today for an acute visit with complaint of rash under folds under abdomen.  Symptoms have been present  years Associated symptoms include: burns,  Pertinent negatives: no fever or chills, no itching Pain severity: 10/10  Treatments tried include : Nystatin, ice packs Treatment effective : not effective     Review of Systems  Constitutional:  Negative for chills and fever.      Objective:     BP (!) 90/58   Pulse 84   Temp 97.6 F (36.4 C) (Oral)   Resp 16   Ht 5\' 3"  (1.6 m)   Wt 260 lb (117.9 kg) Comment: Patient reported, patient in WC  LMP 10/18/1995   SpO2 94%   BMI 46.06 kg/m  BP Readings from Last 3 Encounters:  01/06/23 (!) 90/58  11/04/22 (!) 104/54  07/13/22 (!) 157/52      Physical Exam Vitals and nursing note reviewed.  Constitutional:      Appearance: Normal appearance. She is obese.  Skin:    General: Skin is warm and dry.     Findings: Erythema and rash present. Rash is macular.     Comments: Diffuse erythematous rash from mid abdominal area through  bilateral groin and toward both hips.   Neurological:     Mental Status: She is alert.    No results found for any visits on 01/06/23.    The 10-year ASCVD risk score (Arnett DK, et al., 2019) is: 9.7%    Assessment & Plan:   Problem List Items Addressed This Visit     Cellulitis - Primary   Relevant Medications   doxycycline (VIBRA-TABS) 100 MG tablet  Diffuse erythematous macular rash under skin folds covering a large surface area. Will treat for cellulitis with  doxycycline 100 mg BID x 10 days. She is allergic to cephalosporins.    Fungal dermatitis   Relevant Medications   ketoconazole (NIZORAL) 2 % cream  Been present for years under skin folds,  uses Nystatin without relief this time. Ketoconazole 2% cream to use topically on affected areas. Instructed to keep skin off of skin with cloth barrier. Keep area clean and dry, apply cream before using barrier.   Agrees with plan of care discussed.  Questions answered. Follow-up with PCP if symptoms do not improve.  Okay to use ice packs for comfort.   Return if symptoms worsen or fail to improve.    Chalmers Guest, FNP

## 2023-01-06 NOTE — Telephone Encounter (Signed)
Needs an appointment.  I think this could be done virtually we can offer an appointment at [facility] as well so that way she can be seen before the weekend.  I think we do not have any available appointments here anymore.

## 2023-01-20 ENCOUNTER — Other Ambulatory Visit: Payer: Self-pay

## 2023-01-20 DIAGNOSIS — E1140 Type 2 diabetes mellitus with diabetic neuropathy, unspecified: Secondary | ICD-10-CM

## 2023-01-20 MED ORDER — SEMAGLUTIDE (1 MG/DOSE) 4 MG/3ML ~~LOC~~ SOPN: 1.0000 mg | PEN_INJECTOR | SUBCUTANEOUS | 0 refills | Status: DC

## 2023-01-31 DIAGNOSIS — G4733 Obstructive sleep apnea (adult) (pediatric): Secondary | ICD-10-CM | POA: Diagnosis not present

## 2023-01-31 DIAGNOSIS — J449 Chronic obstructive pulmonary disease, unspecified: Secondary | ICD-10-CM | POA: Diagnosis not present

## 2023-02-01 ENCOUNTER — Other Ambulatory Visit: Payer: Self-pay | Admitting: Pharmacist

## 2023-02-01 NOTE — Progress Notes (Signed)
Patient appearing on report for quality metrics.  Outreached patient to discuss medication management. Left voicemail for patient to return my call at their convenience.   Julliana Whitmyer, PharmD, BCPS Clinical Pharmacist [facility]  

## 2023-02-08 ENCOUNTER — Ambulatory Visit (INDEPENDENT_AMBULATORY_CARE_PROVIDER_SITE_OTHER): Payer: Medicare Other

## 2023-02-08 DIAGNOSIS — Z1231 Encounter for screening mammogram for malignant neoplasm of breast: Secondary | ICD-10-CM | POA: Diagnosis not present

## 2023-02-08 DIAGNOSIS — Z0000 Encounter for general adult medical examination without abnormal findings: Secondary | ICD-10-CM

## 2023-02-10 NOTE — Progress Notes (Signed)
Normal mammogram. Follow up in 1 year.

## 2023-02-15 ENCOUNTER — Other Ambulatory Visit: Payer: Self-pay | Admitting: Physician Assistant

## 2023-02-15 DIAGNOSIS — K219 Gastro-esophageal reflux disease without esophagitis: Secondary | ICD-10-CM

## 2023-02-15 DIAGNOSIS — N3281 Overactive bladder: Secondary | ICD-10-CM

## 2023-02-22 ENCOUNTER — Telehealth: Payer: Self-pay | Admitting: Physician Assistant

## 2023-02-22 DIAGNOSIS — G894 Chronic pain syndrome: Secondary | ICD-10-CM

## 2023-02-22 MED ORDER — HYDROCODONE-ACETAMINOPHEN 10-325 MG PO TABS: 1.0000 | ORAL_TABLET | Freq: Two times a day (BID) | ORAL | 0 refills | Status: DC

## 2023-02-22 NOTE — Telephone Encounter (Signed)
..  PDMP reviewed during this encounter. No concerns Pain contract UTD Appt within 3 months Sent norco

## 2023-02-22 NOTE — Telephone Encounter (Signed)
Patient called requesting a refill of HYDROcodone-acetaminophen (NORCO) 10-325 MG tablet [[id number]]   Pharmacy -  [id number] - [place], [place] - [address]

## 2023-02-26 ENCOUNTER — Other Ambulatory Visit: Payer: Self-pay | Admitting: Physician Assistant

## 2023-02-26 DIAGNOSIS — E1140 Type 2 diabetes mellitus with diabetic neuropathy, unspecified: Secondary | ICD-10-CM

## 2023-02-27 MED ORDER — SEMAGLUTIDE (1 MG/DOSE) 4 MG/3ML ~~LOC~~ SOPN: 1.0000 mg | PEN_INJECTOR | SUBCUTANEOUS | 0 refills | Status: DC

## 2023-03-07 ENCOUNTER — Other Ambulatory Visit: Payer: Self-pay | Admitting: Physician Assistant

## 2023-03-07 DIAGNOSIS — E1343 Other specified diabetes mellitus with diabetic autonomic (poly)neuropathy: Secondary | ICD-10-CM

## 2023-03-15 ENCOUNTER — Other Ambulatory Visit: Payer: Self-pay

## 2023-03-15 DIAGNOSIS — J301 Allergic rhinitis due to pollen: Secondary | ICD-10-CM

## 2023-03-15 MED ORDER — FLUTICASONE PROPIONATE 50 MCG/ACT NA SUSP: NASAL | 1 refills | Status: DC

## 2023-03-15 MED ORDER — LEVOCETIRIZINE DIHYDROCHLORIDE 5 MG PO TABS: ORAL_TABLET | ORAL | 0 refills | Status: DC

## 2023-03-17 ENCOUNTER — Telehealth: Payer: Self-pay | Admitting: Physician Assistant

## 2023-03-17 NOTE — Telephone Encounter (Signed)
Pt called.  She has a bad yeast infection in her vagina and wants to know if we can call in Metronidizole?

## 2023-03-21 MED ORDER — FLUCONAZOLE 150 MG PO TABS: 150.0000 mg | ORAL_TABLET | Freq: Every day | ORAL | 0 refills | Status: DC

## 2023-03-21 NOTE — Telephone Encounter (Signed)
Pt advised.

## 2023-04-10 ENCOUNTER — Other Ambulatory Visit: Payer: Self-pay | Admitting: Physician Assistant

## 2023-04-10 DIAGNOSIS — I10 Essential (primary) hypertension: Secondary | ICD-10-CM

## 2023-04-10 DIAGNOSIS — Z794 Long term (current) use of insulin: Secondary | ICD-10-CM

## 2023-04-10 DIAGNOSIS — R0989 Other specified symptoms and signs involving the circulatory and respiratory systems: Secondary | ICD-10-CM

## 2023-04-26 ENCOUNTER — Ambulatory Visit: Payer: Medicare Other | Admitting: Physician Assistant

## 2023-05-02 ENCOUNTER — Other Ambulatory Visit: Payer: Self-pay | Admitting: Physician Assistant

## 2023-05-02 DIAGNOSIS — L03818 Cellulitis of other sites: Secondary | ICD-10-CM

## 2023-05-02 MED ORDER — DOXYCYCLINE HYCLATE 100 MG PO TABS
100.0000 mg | ORAL_TABLET | Freq: Two times a day (BID) | ORAL | 0 refills | Status: DC
Start: 2023-05-02 — End: 2023-05-08

## 2023-05-02 NOTE — Telephone Encounter (Signed)
Requesting rx rf of doxycycline 100mg   Last written by Dorena Bodo, NP  on 01/06/2023 Last OV 11/04/2022 Upcoming appt 05/08/2023

## 2023-05-02 NOTE — Telephone Encounter (Signed)
Patient is rescheduled for July 22nd for a follow up she is requesting doxycycline 100mg  She never received the medication [facility] [address] [place] Phone number [phone]

## 2023-05-03 NOTE — Telephone Encounter (Signed)
Patient informed. 

## 2023-05-08 ENCOUNTER — Ambulatory Visit (INDEPENDENT_AMBULATORY_CARE_PROVIDER_SITE_OTHER): Payer: Medicare HMO | Admitting: Physician Assistant

## 2023-05-08 ENCOUNTER — Encounter: Payer: Self-pay | Admitting: Physician Assistant

## 2023-05-08 VITALS — BP 117/49 | HR 62

## 2023-05-08 DIAGNOSIS — E1142 Type 2 diabetes mellitus with diabetic polyneuropathy: Secondary | ICD-10-CM

## 2023-05-08 DIAGNOSIS — I152 Hypertension secondary to endocrine disorders: Secondary | ICD-10-CM | POA: Diagnosis not present

## 2023-05-08 DIAGNOSIS — H1013 Acute atopic conjunctivitis, bilateral: Secondary | ICD-10-CM

## 2023-05-08 DIAGNOSIS — G894 Chronic pain syndrome: Secondary | ICD-10-CM

## 2023-05-08 DIAGNOSIS — K219 Gastro-esophageal reflux disease without esophagitis: Secondary | ICD-10-CM | POA: Diagnosis not present

## 2023-05-08 DIAGNOSIS — I10 Essential (primary) hypertension: Secondary | ICD-10-CM

## 2023-05-08 DIAGNOSIS — Z794 Long term (current) use of insulin: Secondary | ICD-10-CM

## 2023-05-08 DIAGNOSIS — E1140 Type 2 diabetes mellitus with diabetic neuropathy, unspecified: Secondary | ICD-10-CM | POA: Diagnosis not present

## 2023-05-08 DIAGNOSIS — J301 Allergic rhinitis due to pollen: Secondary | ICD-10-CM

## 2023-05-08 DIAGNOSIS — G4733 Obstructive sleep apnea (adult) (pediatric): Secondary | ICD-10-CM

## 2023-05-08 DIAGNOSIS — E1159 Type 2 diabetes mellitus with other circulatory complications: Secondary | ICD-10-CM | POA: Diagnosis not present

## 2023-05-08 DIAGNOSIS — E118 Type 2 diabetes mellitus with unspecified complications: Secondary | ICD-10-CM

## 2023-05-08 DIAGNOSIS — J310 Chronic rhinitis: Secondary | ICD-10-CM

## 2023-05-08 LAB — POCT GLYCOSYLATED HEMOGLOBIN (HGB A1C): Hemoglobin A1C: 6.9 % — AB (ref 4.0–5.6)

## 2023-05-08 MED ORDER — CYCLOBENZAPRINE HCL 10 MG PO TABS
5.0000 mg | ORAL_TABLET | Freq: Three times a day (TID) | ORAL | 1 refills | Status: DC | PRN
Start: 2023-05-08 — End: 2023-08-04

## 2023-05-08 MED ORDER — HYDROCODONE-ACETAMINOPHEN 10-325 MG PO TABS
1.0000 | ORAL_TABLET | Freq: Two times a day (BID) | ORAL | 0 refills | Status: DC
Start: 2023-05-08 — End: 2023-07-26

## 2023-05-08 MED ORDER — PIOGLITAZONE HCL 30 MG PO TABS: ORAL_TABLET | ORAL | 3 refills | Status: DC

## 2023-05-08 MED ORDER — MONTELUKAST SODIUM 10 MG PO TABS
10.0000 mg | ORAL_TABLET | Freq: Every day | ORAL | 3 refills | Status: DC
Start: 2023-05-08 — End: 2023-08-04

## 2023-05-08 MED ORDER — FAMOTIDINE 20 MG PO TABS
20.0000 mg | ORAL_TABLET | Freq: Two times a day (BID) | ORAL | 3 refills | Status: DC
Start: 2023-05-08 — End: 2023-08-04

## 2023-05-08 MED ORDER — IPRATROPIUM BROMIDE 0.06 % NA SOLN: 2.0000 | Freq: Four times a day (QID) | NASAL | 1 refills | Status: DC

## 2023-05-08 MED ORDER — GABAPENTIN 800 MG PO TABS
800.0000 mg | ORAL_TABLET | Freq: Three times a day (TID) | ORAL | 1 refills | Status: DC
Start: 2023-05-08 — End: 2024-04-22

## 2023-05-08 MED ORDER — SEMAGLUTIDE (1 MG/DOSE) 4 MG/3ML ~~LOC~~ SOPN
1.0000 mg | PEN_INJECTOR | SUBCUTANEOUS | 0 refills | Status: DC
Start: 2023-05-08 — End: 2023-06-01

## 2023-05-08 MED ORDER — LOSARTAN POTASSIUM 25 MG PO TABS
ORAL_TABLET | ORAL | 1 refills | Status: DC
Start: 2023-05-08 — End: 2023-08-04

## 2023-05-08 MED ORDER — PROPRANOLOL HCL 20 MG PO TABS
ORAL_TABLET | ORAL | 1 refills | Status: DC
Start: 2023-05-08 — End: 2024-04-22

## 2023-05-08 MED ORDER — LEVOCETIRIZINE DIHYDROCHLORIDE 5 MG PO TABS
ORAL_TABLET | ORAL | 0 refills | Status: DC
Start: 2023-05-08 — End: 2023-08-07

## 2023-05-08 NOTE — Progress Notes (Signed)
Established Patient Office Visit  Subjective   Patient ID: [name] Rice, female    DOB: 1956-07-09  Age: 67 y.o. MRN: [id number]  Chief Complaint  Patient presents with   follow up DM    Follow up on Diabetes melitus    HPI Pt is a 67 yo obese female with T2DM with neuropathy, HTN, OSA, GERD, MDD who presents to the clinic with her daughter for 3 month follow up.   Pt is checking sugars and they have been a little higher in the evenings around 200. She is taking her medications. She is not watching her diet. She is not able to move very much and sits all day. She denies any CP, palpitations, headaches or vision changes. She continues to have neuropathy of both feet.   Pt does request norco for pain to use as needed.   She needs new CPAP machine and new study.   Having ongoing rhinitis and allergic conjunctivits. On xyzal/singulair/flonase.     Active Ambulatory Problems    Diagnosis Date Noted   Type II or unspecified type diabetes mellitus with neurological manifestations, not stated as uncontrolled(250.60) 08/08/2006   Hyperlipidemia LDL goal <70 08/08/2006   Depression with anxiety 01/28/2008   Hypertension associated with diabetes (HCC) 08/08/2006   Allergic sinusitis 05/27/2009   GERD 08/08/2006   Acute bilateral low back pain 11/27/2007   OSA on CPAP    Insomnia 02/06/2011   Preventative health care 08/16/2011   History of allergic rhinitis 09/07/2011   Iron deficiency anemia 03/29/2012   Lipoma of abdominal wall 05/01/2012   Intestinal bacterial overgrowth 09/20/2012   SOB (shortness of breath) on exertion 03/31/2013   Hepatic steatosis 09/26/2013   Diverticulitis 11/27/2013   Diabetic peripheral neuropathy (HCC) 05/30/2014   Gastroparesis due to secondary diabetes (HCC) 09/16/2014   Ear pain 12/09/2014   Type 2 diabetes, controlled, with peripheral neuropathy (HCC) 12/09/2014   Diarrhea 01/12/2015   Non-proliferative diabetic retinopathy, right eye  (HCC) 11/23/2015   Chronic pain syndrome 12/15/2015   Morbid obesity due to excess calories (HCC) 02/16/2016   Unstable balance 02/16/2016   Intertrigo 03/15/2016   Left knee pain 03/15/2016   Post-menopausal 03/16/2016   Acute reaction to stress 03/28/2016   Dystonia 09/21/2016   Neck pain 07/11/2017   Lisfranc's dislocation, right, initial encounter 10/06/2017   Freiberg's disease, right 10/06/2017   Vasomotor rhinitis 11/28/2017   Ear itching 01/17/2018   No energy 01/17/2018   Hordeolum externum of right lower eyelid 02/16/2018   Hot flashes 02/16/2018   Restrictive lung disease 04/12/2018   Mixed hyperlipidemia 04/12/2018   Vitamin D deficiency 04/12/2018   Drug-induced constipation 05/23/2018   Calcification and ossification of muscle 07/13/2018   Pressure injury of right foot, unstageable (HCC) 09/25/2018   Bilateral carpal tunnel syndrome post carpal tunnel release on the left 01/24/2019   Type 2 diabetes mellitus with diabetic neuropathy, with long-term current use of insulin (HCC) 06/20/2019   Dizziness 06/20/2019   Acute right ankle pain 09/25/2019   OAB (overactive bladder) 09/25/2019   Chronic kidney disease (CKD), stage III (moderate) (HCC) 10/04/2019   Trigger finger, right middle finger 12/24/2019   Trigger index finger of right hand 12/24/2019   DDD (degenerative disc disease), cervical 12/27/2019   Bilateral rotator cuff syndrome 01/24/2020   Paresthesia 06/30/2020   Weakness 06/30/2020   Chronic fatigue 06/30/2020   Moderate episode of recurrent major depressive disorder (HCC) 07/14/2020   Left ventricular hypertrophy 07/23/2020  Problems with swallowing 03/22/2021   Allergic conjunctivitis of both eyes 03/22/2021   Rosacea 04/02/2021   Chronic right shoulder pain 04/02/2021   Partial nontraumatic tear of left rotator cuff 08/16/2021   Osteopenia 12/30/2021   Blood clot in eye 12/31/2021   Diminished pulses in lower extremity 04/12/2022   Bilateral  leg pain 07/27/2022   Itching 07/27/2022   Clostridioides difficile infection 09/05/2022   Fistula of sigmoid colon 09/05/2022   Acute left-sided low back pain with left-sided sciatica 09/05/2022   Tubular adenoma of colon 09/16/2022   Cellulitis 01/06/2023   Fungal dermatitis 01/06/2023   OSA (obstructive sleep apnea) 05/15/2023   Chronic rhinitis 05/15/2023   Class 3 severe obesity due to excess calories with serious comorbidity and body mass index (BMI) of 40.0 to 44.9 in adult [facility]) 05/15/2023   Seasonal allergic rhinitis due to pollen 05/15/2023   Essential hypertension 05/15/2023   Resolved Ambulatory Problems    Diagnosis Date Noted   IRON DEFICIENCY 01/30/2007   DEPRESSION 08/08/2006   TORTICOLLIS, SPASMODIC 02/13/2007   ALLERGIC RHINITIS 12/25/2006   ASTHMA 01/28/2008   COPD 08/08/2006   DIVERTICULOSIS, COLON 02/12/2003   DERMATITIS, SEBORRHEIC 01/30/2007   BPPV (benign paroxysmal positional vertigo), unspecified laterality 07/14/2009   GOUT, HX OF 08/08/2006   PERSONAL HX COLONIC POLYPS 09/16/2008   NEPHROLITHIASIS, HX OF 01/28/2008   Calf pain 11/29/2010   Tremor, essential 02/06/2011   Seborrheic dermatitis, unspecified 04/06/2011   Finger pain 04/06/2011   Shoulder pain, right 08/16/2011   Left leg pain 04/10/2012   Right ear pain 05/01/2012   Seborrheic dermatitis 05/01/2012   Hypoglycemia 05/01/2012   Vaginal candidiasis 08/22/2012   Sigmoid diverticulitis 09/15/2012   Acute-on-chronic kidney disease Stage II 09/15/2012   Cyst, breast 03/01/2013   Lactic acidosis 03/31/2013   Acute renal insufficiency 03/31/2013   Abdominal pain, left lower quadrant 04/29/2013   Gout 05/01/2013   COPD (chronic obstructive pulmonary disease) with chronic bronchitis (HCC) 12/09/2014   Right shoulder pain 02/16/2016   Fall 03/16/2016   Foot fracture, right, closed, initial encounter 08/23/2017   Chronic pain of both shoulders 12/24/2019   Past Medical History:   Diagnosis Date   Allergy    Anxiety    Arthritis    Asthma    Carpal tunnel syndrome, bilateral    Cataract 2020   Cervical dystonia    COPD (chronic obstructive pulmonary disease) (HCC)    Depression    Diabetes mellitus    Diverticulosis    Gastroparesis    GERD (gastroesophageal reflux disease)    History of colonic polyps    History of kidney stones    History of seborrheic dermatitis    HLD (hyperlipidemia)    Hypertension    Intussusception (HCC)    Lisfranc's sprain, left, initial encounter 10/06/2017   Lumbar pain 2011   Morbid obesity (HCC)    Nephrolithiasis    Osteoporosis 2018   Pneumonia    Recurrent boils    Sleep apnea    Torn rotator cuff RT SHOULDER   Tremor CCENTRAL NERVOUS SYSTEM     ROS See HPI>    Objective:     BP (!) 117/49 (BP Location: Right Arm, Patient Position: Sitting, Cuff Size: Large)   Pulse 62   LMP 10/18/1995   SpO2 100%  BP Readings from Last 3 Encounters:  05/08/23 (!) 117/49  01/06/23 (!) 90/58  11/04/22 (!) 104/54   Wt Readings from Last 3 Encounters:  01/06/23 260 lb (117.9  kg)  11/04/22 257 lb (116.6 kg)  04/12/22 261 lb (118.4 kg)    .Marland Kitchen Results for orders placed or performed in visit on 05/08/23  CMP and Liver  Result Value Ref Range   Glucose 192 (H) 70 - 99 mg/dL   BUN 24 8 - 27 mg/dL   Creatinine, Ser 1.61 (H) 0.57 - 1.00 mg/dL   eGFR 47 (L) >09 UE/AVW/0.98   Sodium 145 (H) 134 - 144 mmol/L   Potassium 4.1 3.5 - 5.2 mmol/L   Chloride 106 96 - 106 mmol/L   CO2 24 20 - 29 mmol/L   Calcium 8.6 (L) 8.7 - 10.3 mg/dL   Total Protein 6.1 6.0 - 8.5 g/dL   Albumin 3.6 (L) 3.9 - 4.9 g/dL   Bilirubin Total 0.2 0.0 - 1.2 mg/dL   Bilirubin, Direct <1.19 0.00 - 0.40 mg/dL   Alkaline Phosphatase 103 44 - 121 IU/L   AST 14 0 - 40 IU/L   ALT 8 0 - 32 IU/L  CBC w/Diff/Platelet  Result Value Ref Range   WBC 7.8 3.4 - 10.8 x10E3/uL   RBC 4.07 3.77 - 5.28 x10E6/uL   Hemoglobin 12.0 11.1 - 15.9 g/dL   Hematocrit  14.7 82.9 - 46.6 %   MCV 102 (H) 79 - 97 fL   MCH 29.5 26.6 - 33.0 pg   MCHC 29.1 (L) 31.5 - 35.7 g/dL   RDW 56.2 13.0 - 86.5 %   Platelets 213 150 - 450 x10E3/uL   Neutrophils 66 Not Estab. %   Lymphs 24 Not Estab. %   Monocytes 5 Not Estab. %   Eos 4 Not Estab. %   Basos 1 Not Estab. %   Neutrophils Absolute 5.2 1.4 - 7.0 x10E3/uL   Lymphocytes Absolute 1.9 0.7 - 3.1 x10E3/uL   Monocytes Absolute 0.4 0.1 - 0.9 x10E3/uL   EOS (ABSOLUTE) 0.3 0.0 - 0.4 x10E3/uL   Basophils Absolute 0.0 0.0 - 0.2 x10E3/uL   Immature Granulocytes 0 Not Estab. %  CMP and Liver  Result Value Ref Range   Glucose CANCELED mg/dL   BUN CANCELED    Creatinine, Ser CANCELED    Sodium CANCELED    Potassium CANCELED    Chloride CANCELED    CO2 CANCELED    Calcium CANCELED    Total Protein CANCELED    Albumin CANCELED    Bilirubin Total CANCELED    Bilirubin, Direct CANCELED    Alkaline Phosphatase CANCELED    AST CANCELED    ALT CANCELED   CBC with Differential/Platelet  Result Value Ref Range   WBC CANCELED x10E3/uL   RBC CANCELED    Hemoglobin CANCELED    Hematocrit CANCELED    Platelets CANCELED    Neutrophils CANCELED    Lymphs CANCELED    Monocytes CANCELED    Eos CANCELED    Lymphocytes Absolute CANCELED    EOS (ABSOLUTE) CANCELED    Basophils Absolute CANCELED   POCT HgB A1C  Result Value Ref Range   Hemoglobin A1C 6.9 (A) 4.0 - 5.6 %   HbA1c POC (<> result, manual entry)     HbA1c, POC (prediabetic range)     HbA1c, POC (controlled diabetic range)     *Note: Due to a large number of results and/or encounters for the requested time period, some results have not been displayed. A complete set of results can be found in Results Review.     Physical Exam Constitutional:      Appearance: Normal appearance.  She is obese.  HENT:     Head: Normocephalic.     Right Ear: Tympanic membrane normal.     Left Ear: Tympanic membrane normal.     Nose: Nose normal.     Mouth/Throat:      Mouth: Mucous membranes are moist.  Eyes:     General:        Right eye: Discharge present.        Left eye: Discharge present.    Comments: Watery eyes  Cardiovascular:     Rate and Rhythm: Normal rate.  Pulmonary:     Effort: Pulmonary effort is normal.     Breath sounds: Normal breath sounds.  Musculoskeletal:     Cervical back: Normal range of motion and neck supple.     Right lower leg: No edema.     Left lower leg: No edema.  Neurological:     General: No focal deficit present.     Mental Status: She is alert and oriented to person, place, and time.  Psychiatric:        Mood and Affect: Mood normal.        The 10-year ASCVD risk score (Arnett DK, et al., 2019) is: 17.6%    Assessment & Plan:  Marland KitchenMarland KitchenChasity was seen today for follow up dm.  Diagnoses and all orders for this visit:  Type 2 diabetes mellitus with diabetic neuropathy, with long-term current use of insulin (HCC) -     gabapentin (NEURONTIN) 800 MG tablet; Take 1 tablet (800 mg total) by mouth 3 (three) times daily. -     Semaglutide, 1 MG/DOSE, 4 MG/3ML SOPN; Inject 1 mg as directed once a week. NEEDS A  F/U APPT W/PCP -     CMP and Liver -     CBC w/Diff/Platelet -     POCT HgB A1C  Allergic conjunctivitis of both eyes -     montelukast (SINGULAIR) 10 MG tablet; Take 1 tablet (10 mg total) by mouth at bedtime.  Chronic pain syndrome -     cyclobenzaprine (FLEXERIL) 10 MG tablet; Take 0.5-1 tablets (5-10 mg total) by mouth 3 (three) times daily as needed. -     HYDROcodone-acetaminophen (NORCO) 10-325 MG tablet; Take 1 tablet by mouth every 12 (twelve) hours.  Diabetic peripheral neuropathy (HCC) -     pioglitazone (ACTOS) 30 MG tablet; TAKE 1 TABLET EVERY DAY  Seasonal allergic rhinitis due to pollen -     levocetirizine (XYZAL) 5 MG tablet; TAKE 1 TABLET(5 MG) BY MOUTH EVERY EVENING  Hypertension associated with diabetes (HCC) -     losartan (COZAAR) 25 MG tablet; TAKE 1 TABLET BY MOUTH DAILY  GENERIC EQUIVALENT FOR COZAAR  Type 2 diabetes, controlled, with peripheral neuropathy (HCC) -     losartan (COZAAR) 25 MG tablet; TAKE 1 TABLET BY MOUTH DAILY GENERIC EQUIVALENT FOR COZAAR -     CMP and Liver -     CBC with Differential/Platelet  Essential hypertension -     propranolol (INDERAL) 20 MG tablet; TAKE 1TABLET BY MOUTH TWICE DAILY -     CMP and Liver -     CBC with Differential/Platelet  Gastroesophageal reflux disease, unspecified whether esophagitis present -     famotidine (PEPCID) 20 MG tablet; Take 1 tablet (20 mg total) by mouth 2 (two) times daily.  OSA (obstructive sleep apnea) -     Split night study; Future  Class 3 severe obesity due to excess calories with serious comorbidity  and body mass index (BMI) of 40.0 to 44.9 in adult [facility]) -     Split night study; Future  Chronic rhinitis -     ipratropium (ATROVENT) 0.06 % nasal spray; Place 2 sprays into both nostrils 4 (four) times daily. -     Ambulatory referral to ENT   A1C to goal but rising.  Make sure to watch sugars and carbs in diet Make sure taking medication as directed  Needs new sleep study due to being so long and needing new CPaP machine.  Order placed today.   GERD-refills given today.   Chronic pain  Pain contract UTD .Marland KitchenPDMP reviewed during this encounter. No concerns Sent refills  Chronic rhinitis On anti-histamine On singulair On flonase Trial of atrovent  ENT referral placed  Discussed norco dosage .Marland KitchenPDMP reviewed during this encounter. Norco refilled Pain contract UTD Follow up in 3 months  Pt is using flonase Added atrovent  Referral to ENT made at patients request  Return in about 3 months (around 08/08/2023).    Tandy Gaw, PA-C

## 2023-05-09 DIAGNOSIS — Z794 Long term (current) use of insulin: Secondary | ICD-10-CM | POA: Diagnosis not present

## 2023-05-09 DIAGNOSIS — E1140 Type 2 diabetes mellitus with diabetic neuropathy, unspecified: Secondary | ICD-10-CM | POA: Diagnosis not present

## 2023-05-09 LAB — CBC WITH DIFFERENTIAL/PLATELET
Basophils Absolute: 0 10*3/uL (ref 0.0–0.2)
Basos: 1 %
EOS (ABSOLUTE): 0.3 10*3/uL (ref 0.0–0.4)
Eos: 4 %
Hematocrit: 41.3 % (ref 34.0–46.6)
Hemoglobin: 12 g/dL (ref 11.1–15.9)
Immature Granulocytes: 0 %
Lymphocytes Absolute: 1.9 10*3/uL (ref 0.7–3.1)
Lymphs: 24 %
MCH: 29.5 pg (ref 26.6–33.0)
MCHC: 29.1 g/dL — ABNORMAL LOW (ref 31.5–35.7)
MCV: 102 fL — ABNORMAL HIGH (ref 79–97)
Monocytes Absolute: 0.4 10*3/uL (ref 0.1–0.9)
Monocytes: 5 %
Neutrophils Absolute: 5.2 10*3/uL (ref 1.4–7.0)
Neutrophils: 66 %
Platelets: 213 10*3/uL (ref 150–450)
RBC: 4.07 x10E6/uL (ref 3.77–5.28)
RDW: 14.1 % (ref 11.7–15.4)
WBC: 7.8 10*3/uL (ref 3.4–10.8)

## 2023-05-09 LAB — CMP AND LIVER
ALT: 8 IU/L (ref 0–32)
AST: 14 IU/L (ref 0–40)
Albumin: 3.6 g/dL — ABNORMAL LOW (ref 3.9–4.9)
Alkaline Phosphatase: 103 IU/L (ref 44–121)
BUN: 24 mg/dL (ref 8–27)
Bilirubin Total: 0.2 mg/dL (ref 0.0–1.2)
Bilirubin, Direct: <0.2 mg/dL (ref 0.00–0.40)
CO2: 24 mmol/L (ref 20–29)
Calcium: 8.6 mg/dL — ABNORMAL LOW (ref 8.7–10.3)
Chloride: 106 mmol/L (ref 96–106)
Creatinine, Ser: 1.25 mg/dL — ABNORMAL HIGH (ref 0.57–1.00)
Glucose: 192 mg/dL — ABNORMAL HIGH (ref 70–99)
Potassium: 4.1 mmol/L (ref 3.5–5.2)
Sodium: 145 mmol/L — ABNORMAL HIGH (ref 134–144)
Total Protein: 6.1 g/dL (ref 6.0–8.5)
eGFR: 47 mL/min/{1.73_m2} — ABNORMAL LOW (ref >59)

## 2023-05-11 LAB — CMP AND LIVER

## 2023-05-11 LAB — CBC WITH DIFFERENTIAL/PLATELET

## 2023-05-12 ENCOUNTER — Encounter: Payer: Self-pay | Admitting: Physician Assistant

## 2023-05-12 NOTE — Progress Notes (Signed)
Anyae,   Calcium low. Are you taking any extra calcium? If not make sure taking 600mg  twice a day.   Albumin low meaning you are not getting enough protein in your diet.

## 2023-05-15 ENCOUNTER — Encounter: Payer: Self-pay | Admitting: Physician Assistant

## 2023-05-15 DIAGNOSIS — I10 Essential (primary) hypertension: Secondary | ICD-10-CM | POA: Insufficient documentation

## 2023-05-15 DIAGNOSIS — J301 Allergic rhinitis due to pollen: Secondary | ICD-10-CM | POA: Insufficient documentation

## 2023-05-15 DIAGNOSIS — G4733 Obstructive sleep apnea (adult) (pediatric): Secondary | ICD-10-CM | POA: Insufficient documentation

## 2023-05-15 DIAGNOSIS — J310 Chronic rhinitis: Secondary | ICD-10-CM | POA: Insufficient documentation

## 2023-05-22 ENCOUNTER — Encounter: Payer: Self-pay | Admitting: Physician Assistant

## 2023-05-31 ENCOUNTER — Other Ambulatory Visit: Payer: Self-pay | Admitting: Physician Assistant

## 2023-05-31 DIAGNOSIS — E1140 Type 2 diabetes mellitus with diabetic neuropathy, unspecified: Secondary | ICD-10-CM

## 2023-05-31 DIAGNOSIS — H538 Other visual disturbances: Secondary | ICD-10-CM | POA: Diagnosis not present

## 2023-06-07 ENCOUNTER — Telehealth: Payer: Self-pay | Admitting: Physician Assistant

## 2023-06-07 NOTE — Telephone Encounter (Signed)
Received incoming call from [facility] regarding PA for split night sleep study CPT code: 02542

## 2023-06-07 NOTE — Telephone Encounter (Signed)
Contacted [employer] at [phone], spoke with Shawn reference#[id number].  Split night sleep study CPT code: 98119 requires PA to be completed for ages 57 up.   Was given [employer] [phone] and advise to setup [employer] web portal to complete PA online.

## 2023-06-07 NOTE — Telephone Encounter (Signed)
Contacted [employer] [phone]- spoke with Nica B.  Reference [id number]. PA completed. Authorization effective dates 06/07/2023-08/30/2023 PA approved, authorization #[id number]

## 2023-06-22 ENCOUNTER — Other Ambulatory Visit: Payer: Self-pay

## 2023-06-22 DIAGNOSIS — E1142 Type 2 diabetes mellitus with diabetic polyneuropathy: Secondary | ICD-10-CM

## 2023-06-22 MED ORDER — DULOXETINE HCL 60 MG PO CPEP
120.0000 mg | ORAL_CAPSULE | Freq: Every day | ORAL | 0 refills | Status: AC
Start: 2023-06-22 — End: ?

## 2023-07-11 ENCOUNTER — Encounter (HOSPITAL_BASED_OUTPATIENT_CLINIC_OR_DEPARTMENT_OTHER): Payer: Medicare HMO | Admitting: Internal Medicine

## 2023-07-14 ENCOUNTER — Other Ambulatory Visit: Payer: Self-pay | Admitting: Physician Assistant

## 2023-07-14 DIAGNOSIS — E1142 Type 2 diabetes mellitus with diabetic polyneuropathy: Secondary | ICD-10-CM

## 2023-07-19 ENCOUNTER — Telehealth: Payer: Self-pay | Admitting: Physician Assistant

## 2023-07-19 NOTE — Telephone Encounter (Addendum)
Prescription Request  07/19/2023  LOV: 05/08/2023  What is the name of the medication or equipment? HYDROcodone-acetaminophen (NORCO) 10-325 MG tablet   Have you contacted your pharmacy to request a refill? Yes   Which pharmacy would you like this sent to?   [phone] [place], [place] - [address] [place] [address] Phone: [phone] Fax: [phone]      Patient notified that their request is being sent to the clinical staff for review and that they should receive a response within 2 business days.   Please advise at [facility] [phone]

## 2023-07-24 ENCOUNTER — Telehealth: Payer: Self-pay | Admitting: Physician Assistant

## 2023-07-24 ENCOUNTER — Other Ambulatory Visit: Payer: Self-pay | Admitting: Physician Assistant

## 2023-07-24 DIAGNOSIS — G894 Chronic pain syndrome: Secondary | ICD-10-CM

## 2023-07-24 DIAGNOSIS — N3281 Overactive bladder: Secondary | ICD-10-CM

## 2023-07-24 NOTE — Telephone Encounter (Signed)
Prescription Request  07/24/2023  LOV: 05/08/2023  What is the name of the medication or equipment? HYDROcodone-acetaminophen (NORCO) 10-325 MG tablet, DULoxetine (CYMBALTA) 60 MG capsule  Have you contacted your pharmacy to request a refill? Yes   Which pharmacy would you like this sent to?   [id number] - [place], [place] - [address] [place] [address] Phone: [phone] Fax: [phone]    Patient notified that their request is being sent to the clinical staff for review and that they should receive a response within 2 business days.   Please advise at [facility] [phone]

## 2023-07-25 NOTE — Telephone Encounter (Signed)
Requesting rx rf of hydrocodone and cymbalta

## 2023-07-26 MED ORDER — HYDROCODONE-ACETAMINOPHEN 10-325 MG PO TABS
1.0000 | ORAL_TABLET | Freq: Two times a day (BID) | ORAL | 0 refills | Status: DC
Start: 2023-07-26 — End: 2023-09-11

## 2023-07-26 NOTE — Telephone Encounter (Signed)
Attempted call to patient. Voice mail not yet set up . Could not leave a voice mail message .

## 2023-07-26 NOTE — Telephone Encounter (Signed)
..  PDMP reviewed during this encounter. No concerns Cymbalta already sent Norco last fill 7/22 Sent refill.

## 2023-07-31 NOTE — Telephone Encounter (Signed)
Attempted call to patient. Voice mail not yet set up. Could not leave a voicemail message.

## 2023-08-01 NOTE — Telephone Encounter (Signed)
Patient informed. 

## 2023-08-04 ENCOUNTER — Other Ambulatory Visit: Payer: Self-pay | Admitting: Physician Assistant

## 2023-08-04 ENCOUNTER — Other Ambulatory Visit: Payer: Self-pay | Admitting: Family Medicine

## 2023-08-04 ENCOUNTER — Ambulatory Visit
Admission: EM | Admit: 2023-08-04 | Discharge: 2023-08-04 | Disposition: A | Discharge status: Home / Self Care | Payer: Medicare HMO | Attending: Family Medicine | Admitting: Family Medicine

## 2023-08-04 ENCOUNTER — Other Ambulatory Visit: Payer: Self-pay

## 2023-08-04 DIAGNOSIS — R197 Diarrhea, unspecified: Secondary | ICD-10-CM

## 2023-08-04 DIAGNOSIS — R112 Nausea with vomiting, unspecified: Secondary | ICD-10-CM

## 2023-08-04 DIAGNOSIS — K219 Gastro-esophageal reflux disease without esophagitis: Secondary | ICD-10-CM

## 2023-08-04 DIAGNOSIS — I152 Hypertension secondary to endocrine disorders: Secondary | ICD-10-CM

## 2023-08-04 DIAGNOSIS — E1142 Type 2 diabetes mellitus with diabetic polyneuropathy: Secondary | ICD-10-CM

## 2023-08-04 DIAGNOSIS — H1013 Acute atopic conjunctivitis, bilateral: Secondary | ICD-10-CM

## 2023-08-04 DIAGNOSIS — J301 Allergic rhinitis due to pollen: Secondary | ICD-10-CM

## 2023-08-04 LAB — POCT INFLUENZA A/B
Influenza A, POC: NEGATIVE
Influenza B, POC: NEGATIVE

## 2023-08-04 MED ORDER — DICYCLOMINE HCL 20 MG PO TABS: 20.0000 mg | ORAL_TABLET | Freq: Two times a day (BID) | ORAL | 0 refills | Status: DC | PRN

## 2023-08-04 NOTE — Discharge Instructions (Addendum)
Advised patient influenza was negative.  Advised patient may take ventral daily or as needed for diarrhea.  Advised if symptoms worsen and/or unresolved please follow-up with PCP or here for further evaluation and possible stool kit.  Encouraged to increase daily water intake to 64 ounces per day while taking this medication.  Advised if symptoms worsen and/or unresolved please follow-up with PCP or here for further evaluation.

## 2023-08-04 NOTE — ED Provider Notes (Signed)
[facility]    CSN: [id number] Arrival date & time: 08/04/23  1447      History   Chief Complaint Chief Complaint  Patient presents with   Diarrhea    HPI [name] Rice is a 67 y.o. female.   HPI Pleasant 67 year old female presents with vomiting and diarrhea for 2 to 3 days.  PMH significant for morbid obesity, T2DM, COPD, diarrhea and diverticulitis.  Patient reports intermittent vomiting and diarrhea for 2 to 3 days early last week.  Patient is accompanied by her friend this afternoon.  Patient request to be tested for the flu.  Past Medical History:  Diagnosis Date   Allergy    Always allergic to cut grass   Anxiety    Arthritis    Asthma    no PFTs   Carpal tunnel syndrome, bilateral    Cataract 2020   Removed   Cervical dystonia    COPD (chronic obstructive pulmonary disease) (HCC)    no PFTs   Depression    Diabetes mellitus    Diarrhea    Diverticulitis    Diverticulosis    Dizziness    Gastroparesis    GERD (gastroesophageal reflux disease)    History of colonic polyps    hyperplastic   History of kidney stones    History of seborrheic dermatitis    face,ears   HLD (hyperlipidemia)    Hypertension    Intussusception (HCC)    Lisfranc's sprain, left, initial encounter 10/06/2017   Lumbar pain 2011   per x-ray - Multilevel spondylosis   Morbid obesity (HCC)    Nephrolithiasis    OSA on CPAP 2008   Osteoporosis 2018   Pneumonia    Recurrent boils    "SLUSTER BOILS" ON ABD AND LABIA   Sleep apnea    USES C-PAP   Torn rotator cuff RT SHOULDER   Tremor CCENTRAL NERVOUS SYSTEM    Patient Active Problem List   Diagnosis Date Noted   OSA (obstructive sleep apnea) 05/15/2023   Chronic rhinitis 05/15/2023   Class 3 severe obesity due to excess calories with serious comorbidity and body mass index (BMI) of 40.0 to 44.9 in adult (HCC) 05/15/2023   Seasonal allergic rhinitis due to pollen 05/15/2023   Essential hypertension 05/15/2023    Cellulitis 01/06/2023   Fungal dermatitis 01/06/2023   Tubular adenoma of colon 09/16/2022   Clostridioides difficile infection 09/05/2022   Fistula of sigmoid colon 09/05/2022   Acute left-sided low back pain with left-sided sciatica 09/05/2022   Bilateral leg pain 07/27/2022   Itching 07/27/2022   Diminished pulses in lower extremity 04/12/2022   Blood clot in eye 12/31/2021   Osteopenia 12/30/2021   Partial nontraumatic tear of left rotator cuff 08/16/2021   Rosacea 04/02/2021   Chronic right shoulder pain 04/02/2021   Problems with swallowing 03/22/2021   Allergic conjunctivitis of both eyes 03/22/2021   Left ventricular hypertrophy 07/23/2020   Moderate episode of recurrent major depressive disorder (HCC) 07/14/2020   Paresthesia 06/30/2020   Weakness 06/30/2020   Chronic fatigue 06/30/2020   Bilateral rotator cuff syndrome 01/24/2020   DDD (degenerative disc disease), cervical 12/27/2019   Trigger finger, right middle finger 12/24/2019   Trigger index finger of right hand 12/24/2019   Chronic kidney disease (CKD), stage III (moderate) (HCC) 10/04/2019   Acute right ankle pain 09/25/2019   OAB (overactive bladder) 09/25/2019   Type 2 diabetes mellitus with diabetic neuropathy, with long-term current use of insulin (HCC) 06/20/2019  Dizziness 06/20/2019   Bilateral carpal tunnel syndrome post carpal tunnel release on the left 01/24/2019   Pressure injury of right foot, unstageable (HCC) 09/25/2018   Calcification and ossification of muscle 07/13/2018   Drug-induced constipation 05/23/2018   Restrictive lung disease 04/12/2018   Mixed hyperlipidemia 04/12/2018   Vitamin D deficiency 04/12/2018   Hordeolum externum of right lower eyelid 02/16/2018   Hot flashes 02/16/2018   Ear itching 01/17/2018   No energy 01/17/2018   Vasomotor rhinitis 11/28/2017   Lisfranc's dislocation, right, initial encounter 10/06/2017   Freiberg's disease, right 10/06/2017   Neck pain  07/11/2017   Dystonia 09/21/2016   Acute reaction to stress 03/28/2016   Post-menopausal 03/16/2016   Intertrigo 03/15/2016   Left knee pain 03/15/2016   Morbid obesity due to excess calories (HCC) 02/16/2016   Unstable balance 02/16/2016   Chronic pain syndrome 12/15/2015   Non-proliferative diabetic retinopathy, right eye (HCC) 11/23/2015   Diarrhea 01/12/2015   Ear pain 12/09/2014   Type 2 diabetes, controlled, with peripheral neuropathy (HCC) 12/09/2014   Gastroparesis due to secondary diabetes (HCC) 09/16/2014   Diabetic peripheral neuropathy (HCC) 05/30/2014   Diverticulitis 11/27/2013   Hepatic steatosis 09/26/2013   SOB (shortness of breath) on exertion 03/31/2013   Intestinal bacterial overgrowth 09/20/2012   Lipoma of abdominal wall 05/01/2012   Iron deficiency anemia 03/29/2012   History of allergic rhinitis 09/07/2011   Preventative health care 08/16/2011   Insomnia 02/06/2011   OSA on CPAP    Allergic sinusitis 05/27/2009   Depression with anxiety 01/28/2008   Acute bilateral low back pain 11/27/2007   Type II or unspecified type diabetes mellitus with neurological manifestations, not stated as uncontrolled(250.60) 08/08/2006   Hyperlipidemia LDL goal <70 08/08/2006   Hypertension associated with diabetes (HCC) 08/08/2006   GERD 08/08/2006    Past Surgical History:  Procedure Laterality Date   CARPAL TUNNEL RELEASE Left 2006   ELBOW SURGERY Left    EYE SURGERY  2020   HAND SURGERY Right RIGHT   KNEE ARTHROSCOPY Left    LUMBAR LAMINECTOMY     MASS EXCISION  06/28/2012   Procedure: EXCISION MASS;  Surgeon: Shelly Rubenstein, MD;  Location: [facility] OR;  Service: General;  Laterality: Left;  excision left upper quadrant abdominal wall mass, Benign Lipoma   NASAL SINUS SURGERY     PILONIDAL CYST EXCISION  [id number]   SUBACROMIAL DECOMPRESSION Right    TUBAL LIGATION      OB History   No obstetric history on file.      Home Medications    Prior to  Admission medications   Medication Sig Start Date End Date Taking? Authorizing Provider  dicyclomine (BENTYL) 20 MG tablet Take 1 tablet (20 mg total) by mouth 2 (two) times daily as needed for spasms. 08/04/23  Yes Trevor Iha, FNP  Alcohol Swabs (B-D SINGLE USE SWABS REGULAR) PADS 1 Device by Does not apply route 4 (four) times daily as needed. 06/16/21   Breeback, Jade L, PA-C  azelastine (OPTIVAR) 0.05 % ophthalmic solution INSTILL 1 DROP INTO BOTH EYES TWICE DAILY 08/31/21   Breeback, Jade L, PA-C  Blood Glucose Calibration (TRUE METRIX LEVEL 1) Low SOLN 1 each by In Vitro route daily as needed. 06/16/21   Breeback, Jade L, PA-C  blood glucose meter kit and supplies 1 each by Other route in the morning, at noon, in the evening, and at bedtime. [employer] True Meter Air Meter Use up to four times daily as directed. (  FOR ICD-10 E10.9, E11.9). 12/31/19   Breeback, Jade L, PA-C  Blood Glucose Monitoring Suppl (TRUE METRIX METER) DEVI 1 Device by Does not apply route daily. 08/11/21   Breeback, Lonna Cobb, PA-C  Blood Glucose Monitoring Suppl DEVI 1 each by Does not apply route in the morning, at noon, and at bedtime. May substitute to any manufacturer covered by patient's insurance. 12/02/22   Breeback, Lonna Cobb, PA-C  Cholecalciferol (VITAMIN D) 50 MCG (2000 UT) CAPS Take 2,000 Units by mouth daily.    [provider]  DROPLET PEN NEEDLES 32G X 6 MM MISC USE THREE TIMES DAILY  WITH  NOVOLOG 06/08/21   Breeback, Jade L, PA-C  DULoxetine (CYMBALTA) 60 MG capsule TAKE TWO CAPSULES BY MOUTH DAILY. 07/14/23   Breeback, Jade L, PA-C  famotidine (PEPCID) 20 MG tablet TAKE 1 TABLET BY MOUTH TWICE A DAY 08/04/23   Breeback, Jade L, PA-C  fluticasone (FLONASE) 50 MCG/ACT nasal spray SHAKE LIQUID AND USE 2 SPRAYS IN EACH NOSTRIL DAILY 03/15/23   Agapito Games, MD  furosemide (LASIX) 20 MG tablet TAKE 1 TABLET TWICE DAILY 11/04/22   Breeback, Jade L, PA-C  gabapentin (NEURONTIN) 800 MG tablet Take 1 tablet  (800 mg total) by mouth 3 (three) times daily. 05/08/23   Breeback, Jade L, PA-C  glucose blood (ONETOUCH ULTRA) test strip USE THREE TIMES DAILY(MORNING, NOON AND NIGHT) 01/05/23   Breeback, Jade L, PA-C  HYDROcodone-acetaminophen (NORCO) 10-325 MG tablet Take 1 tablet by mouth every 12 (twelve) hours. 07/26/23   Breeback, Lonna Cobb, PA-C  hydrOXYzine (ATARAX) 50 MG tablet Take 1 tablet (50 mg total) by mouth every 8 (eight) hours as needed. For itching. 07/13/22   Breeback, Jade L, PA-C  insulin glargine, 2 Unit Dial, (TOUJEO MAX SOLOSTAR) 300 UNIT/ML Solostar Pen Inject 40 Units into the skin at bedtime. 11/04/22   Breeback, Jade L, PA-C  ipratropium (ATROVENT) 0.06 % nasal spray Place 2 sprays into both nostrils 4 (four) times daily. 05/08/23   Jomarie Longs, PA-C  Lancet Devices (TRUEDRAW LANCING DEVICE) MISC 1 each by Does not apply route 4 (four) times daily as needed. 06/16/21   Breeback, Lonna Cobb, PA-C  levocetirizine (XYZAL) 5 MG tablet TAKE 1 TABLET(5 MG) BY MOUTH EVERY EVENING 05/08/23   Breeback, Jade L, PA-C  losartan (COZAAR) 25 MG tablet TAKE 1 TABLET EVERY DAY 08/04/23   Breeback, Jade L, PA-C  meclizine (ANTIVERT) 25 MG tablet Take 1 tablet (25 mg total) by mouth 3 (three) times daily as needed for dizziness. 02/03/22   Breeback, Jade L, PA-C  metoCLOPramide (REGLAN) 10 MG tablet TAKE 1/2 TABLET(5 MG) BY MOUTH THREE TIMES DAILY BEFORE MEALS 03/07/23   Breeback, Jade L, PA-C  metroNIDAZOLE (METROGEL) 1 % gel Apply topically daily. 03/22/21   Jomarie Longs, PA-C  mirabegron ER (MYRBETRIQ) 25 MG TB24 tablet TAKE 1 TABLET BY MOUTH DAILY 07/25/23   Breeback, Jade L, PA-C  Misc. Devices [facility] CUSHION) MISC 1 Device by Does not apply route daily. 08/30/22   Breeback, Jade L, PA-C  montelukast (SINGULAIR) 10 MG tablet TAKE 1 TABLET AT BEDTIME 08/04/23   Breeback, Jade L, PA-C  nystatin ointment (MYCOSTATIN) APPLY TO AFFECTED AREA TWICE A DAY 11/04/22   Breeback, Jade L, PA-C  pantoprazole  (PROTONIX) 40 MG tablet TAKE 1 TABLET BY MOUTH DAILY 02/15/23   Breeback, Jade L, PA-C  pioglitazone (ACTOS) 30 MG tablet TAKE 1 TABLET EVERY DAY 05/08/23   Jomarie Longs, PA-C  pravastatin (PRAVACHOL) 10 MG tablet Take 1 tablet (10 mg total) by mouth daily. 04/12/22   Breeback, Jade L, PA-C  propranolol (INDERAL) 20 MG tablet TAKE 1TABLET BY MOUTH TWICE DAILY 05/08/23   Breeback, Jade L, PA-C  Semaglutide, 1 MG/DOSE, (OZEMPIC, 1 MG/DOSE,) 4 MG/3ML SOPN INJECT 1 MG AS DIRECTED ONCE A WEEK. NEEDS A F/U APPT W/PCP 06/01/23   Breeback, Jade L, PA-C  TRUEplus Lancets 33G MISC TEST BLOOD SUGAR  UP  TO FOUR TIMES DAILY 06/16/21   Breeback, Jade L, PA-C  vitamin B-12 (CYANOCOBALAMIN) 1000 MCG tablet Take 1 tablet (1,000 mcg total) by mouth daily. 04/12/22   Jomarie Longs, PA-C    Family History Family History  Problem Relation Age of Onset   Heart disease Brother    Heart attack Brother    Depression Brother    Diabetes Brother    Hypertension Brother    Hyperlipidemia Brother    Diabetes Mother    Hyperlipidemia Mother    Hypertension Mother    Kidney disease Mother    Depression Sister    Diabetes Sister    Hyperlipidemia Sister    Hypertension Sister    Stroke Sister    Vision loss Sister    Heart attack Brother    Depression Brother    Diabetes Brother    Hyperlipidemia Brother    Hypertension Brother    Kidney cancer Brother    Cancer Neg Hx     Social History Social History   Tobacco Use   Smoking status: Former    Current packs/day: 0.00    Average packs/day: 0.5 packs/day for 30.0 years (15.0 ttl pk-yrs)    Types: Cigarettes    Start date: 10/17/1966    Quit date: 10/17/1996    Years since quitting: 26.8   Smokeless tobacco: Never  Vaping Use   Vaping status: Never Used  Substance Use Topics   Alcohol use: No   Drug use: No     Allergies   Ace inhibitors, Atrovent [ipratropium], Cefaclor, Glyxambi [empagliflozin-linagliptin], Shrimp [shellfish allergy], Farxiga  [dapagliflozin], Metformin and related, Wellbutrin [bupropion], Ceclor [cefaclor], Sulfa antibiotics, and Sulfonamide derivatives   Review of Systems Review of Systems  Gastrointestinal:  Positive for diarrhea.  Skin:        Concerned with possible sebaceous cyst of gluteal cleft  All other systems reviewed and are negative.    Physical Exam Triage Vital Signs ED Triage Vitals  Encounter Vitals Group     BP      Systolic BP Percentile      Diastolic BP Percentile      Pulse      Resp      Temp      Temp src      SpO2      Weight      Height      Head Circumference      Peak Flow      Pain Score      Pain Loc      Pain Education      Exclude from Growth Chart    No data found.  Updated Vital Signs BP 116/66   Pulse 80   Temp 98.5 F (36.9 C) (Oral)   Resp 16   LMP 10/18/1995   SpO2 99%      Physical Exam Vitals and nursing note reviewed.  Constitutional:      Appearance: Normal appearance. She is obese. She is ill-appearing.  HENT:  Head: Normocephalic and atraumatic.     Right Ear: Tympanic membrane, ear canal and external ear normal.     Left Ear: Tympanic membrane, ear canal and external ear normal.     Nose: Nose normal.     Mouth/Throat:     Mouth: Mucous membranes are moist.     Pharynx: Oropharynx is clear.  Eyes:     Extraocular Movements: Extraocular movements intact.     Conjunctiva/sclera: Conjunctivae normal.     Pupils: Pupils are equal, round, and reactive to light.  Cardiovascular:     Rate and Rhythm: Normal rate and regular rhythm.     Pulses: Normal pulses.     Heart sounds: Normal heart sounds.  Pulmonary:     Effort: Pulmonary effort is normal.     Breath sounds: Normal breath sounds.  Musculoskeletal:        General: Normal range of motion.     Cervical back: Normal range of motion and neck supple.  Skin:    General: Skin is warm and dry.  Neurological:     General: No focal deficit present.     Mental Status: She is  alert and oriented to person, place, and time. Mental status is at baseline.     Comments: Patient seated comfortably in wheelchair throughout visit today.  Psychiatric:        Mood and Affect: Mood normal.        Behavior: Behavior normal.        Thought Content: Thought content normal.      UC Treatments / Results  Labs (all labs ordered are listed, but only abnormal results are displayed) Labs Reviewed  POCT INFLUENZA A/B    EKG   Radiology No results found.  Procedures Procedures (including critical care time)  Medications Ordered in UC Medications - No data to display  Initial Impression / Assessment and Plan / UC Course  I have reviewed the triage vital signs and the nursing notes.  Pertinent labs & imaging results that were available during my care of the patient were reviewed by me and considered in my medical decision making (see chart for details).     MDM: 1.  Diarrhea, unspecified type-Rx'd Bentyl 20 mg tablet: Take 1 tablet 2 times daily, as needed for diarrhea. 2. Nausea and vomiting, unspecified vomiting type-advised patient to adhere to bland/brat (bananas rice/rice pudding/applesauce/toast) for the next 2 to 3 days gradually returning to normal diet. Advised patient influenza was negative.  Advised patient may take ventral daily or as needed for diarrhea.  Advised if symptoms worsen and/or unresolved please follow-up with PCP or here for further evaluation and possible stool kit.  Encouraged to increase daily water intake to 64 ounces per day while taking this medication.  Advised if symptoms worsen and/or unresolved please follow-up with PCP or here for further evaluation.  Final Clinical Impressions(s) / UC Diagnoses   Final diagnoses:  Nausea and vomiting, unspecified vomiting type  Diarrhea, unspecified type     Discharge Instructions      Advised patient influenza was negative.  Advised patient may take ventral daily or as needed for diarrhea.   Advised if symptoms worsen and/or unresolved please follow-up with PCP or here for further evaluation and possible stool kit.  Encouraged to increase daily water intake to 64 ounces per day while taking this medication.  Advised if symptoms worsen and/or unresolved please follow-up with PCP or here for further evaluation.     ED Prescriptions  Medication Sig Dispense Auth. Provider   dicyclomine (BENTYL) 20 MG tablet Take 1 tablet (20 mg total) by mouth 2 (two) times daily as needed for spasms. 24 tablet Trevor Iha, FNP      PDMP not reviewed this encounter.   Trevor Iha, FNP 08/04/23 [phone]

## 2023-08-04 NOTE — ED Triage Notes (Addendum)
Had vomiting for 2-3 days a week ago. A few days after onset of vomiting started having diarrhea. Vomiting has gone but still has diarrhea and abdominal pain.

## 2023-08-09 ENCOUNTER — Ambulatory Visit (INDEPENDENT_AMBULATORY_CARE_PROVIDER_SITE_OTHER): Payer: Medicare HMO | Admitting: Physician Assistant

## 2023-08-09 VITALS — BP 117/59 | HR 67 | Ht 63.0 in | Wt 250.0 lb

## 2023-08-09 DIAGNOSIS — A084 Viral intestinal infection, unspecified: Secondary | ICD-10-CM

## 2023-08-09 DIAGNOSIS — E1142 Type 2 diabetes mellitus with diabetic polyneuropathy: Secondary | ICD-10-CM

## 2023-08-09 DIAGNOSIS — E1140 Type 2 diabetes mellitus with diabetic neuropathy, unspecified: Secondary | ICD-10-CM

## 2023-08-09 DIAGNOSIS — Z6841 Body Mass Index (BMI) 40.0 and over, adult: Secondary | ICD-10-CM

## 2023-08-09 DIAGNOSIS — E785 Hyperlipidemia, unspecified: Secondary | ICD-10-CM

## 2023-08-09 DIAGNOSIS — Z794 Long term (current) use of insulin: Secondary | ICD-10-CM

## 2023-08-09 DIAGNOSIS — J310 Chronic rhinitis: Secondary | ICD-10-CM | POA: Diagnosis not present

## 2023-08-09 DIAGNOSIS — E559 Vitamin D deficiency, unspecified: Secondary | ICD-10-CM | POA: Diagnosis not present

## 2023-08-09 DIAGNOSIS — E66813 Obesity, class 3: Secondary | ICD-10-CM | POA: Diagnosis not present

## 2023-08-09 DIAGNOSIS — G894 Chronic pain syndrome: Secondary | ICD-10-CM | POA: Diagnosis not present

## 2023-08-09 LAB — POCT GLYCOSYLATED HEMOGLOBIN (HGB A1C): Hemoglobin A1C: 6.5 % — AB (ref 4.0–5.6)

## 2023-08-09 MED ORDER — FLUTICASONE PROPIONATE 50 MCG/ACT NA SUSP
NASAL | 1 refills | Status: DC
Start: 2023-08-09 — End: 2023-11-27

## 2023-08-09 MED ORDER — DULOXETINE HCL 60 MG PO CPEP
120.0000 mg | ORAL_CAPSULE | Freq: Every day | ORAL | 0 refills | Status: DC
Start: 2023-08-09 — End: 2024-04-22

## 2023-08-09 MED ORDER — OZEMPIC (1 MG/DOSE) 4 MG/3ML ~~LOC~~ SOPN
PEN_INJECTOR | SUBCUTANEOUS | 0 refills | Status: DC
Start: 2023-08-09 — End: 2023-09-26

## 2023-08-09 NOTE — Patient Instructions (Addendum)
Atrovent/azelatine/flonase 3 nasal sprays  Stop bentyl and see if abdominal pain improves as bowel movements become more regular

## 2023-08-09 NOTE — Progress Notes (Signed)
Established Patient Office Visit  Subjective   Patient ID: [name] Rice, female    DOB: April 02, 1956  Age: 67 y.o. MRN: [id number]  Chief Complaint  Patient presents with   Medical Management of Chronic Issues    Last A1c 6.9    HPI  [name] Rice is a 67 year old female with HTN, T2DM with neuropathy, OSA, GERD, MDD who presents to the clinic with her daughter for 3 month follow up.  Patient has been sick off and on since the beginning of the month and has not been checking her sugars due to this. She reports she had some vomiting episodes in the beginning of the month but that has since resolved. She saw urgent care 10/18 for diarrhea and she was given bentyl for. She states she has had normal BM today but is still experiencing abdominal pain and bloating. She has had a decreased appetite and mild SOB but no fever.   She states she has been doing well on Ozempic and continues to lose weight. She states she has not had any side effects. She is requesting a refill.  She missed her ENT appointment due to having this appointment being at the same time and didn't realize until today. She plans to reschedule. She continues to have sinus congestion despite using daily anti-histamine and nasal sprays.   She missed her sleep study due to a yeast infection under her breasts. She is thinking about rescheduling and would feel more comfortable doing an at home sleep study if that is possible. She is unhappy with the new CPAP mask and hopes this gets changed. She states she is able to sleep without the CPAP if she has to but sleeps much better when she does use it.   ROS See HPI   Objective:     BP (!) 117/59   Pulse 67   Ht 5\' 3"  (1.6 m)   Wt 250 lb (113.4 kg)   LMP 10/18/1995   SpO2 99%   BMI 44.29 kg/m  BP Readings from Last 3 Encounters:  08/09/23 (!) 117/59  08/04/23 116/66  05/08/23 (!) 117/49   Wt Readings from Last 3 Encounters:  08/09/23 250 lb (113.4 kg)  01/06/23 260 lb  (117.9 kg)  11/04/22 257 lb (116.6 kg)    .Marland Kitchen Diabetic Foot Exam - Simple   Simple Foot Form Diabetic Foot exam was performed with the following findings: Yes 08/09/2023  1:42 PM  Visual Inspection No deformities, no ulcerations, no other skin breakdown bilaterally: Yes Sensation Testing Intact to touch and monofilament testing bilaterally: Yes Pulse Check Posterior Tibialis and Dorsalis pulse intact bilaterally: Yes Comments Pt has a hx of diabetic neuropathy       Physical Exam Constitutional:      Appearance: Normal appearance. She is obese.  HENT:     Head: Normocephalic.     Comments: Mild erythema in oropharynx.    Nose: Congestion present.     Mouth/Throat:     Mouth: Mucous membranes are moist.  Cardiovascular:     Rate and Rhythm: Normal rate and regular rhythm.     Heart sounds: Normal heart sounds.  Pulmonary:     Effort: Pulmonary effort is normal.     Breath sounds: Normal breath sounds.  Abdominal:     General: There is no distension.     Palpations: Abdomen is soft. There is no mass.     Tenderness: There is abdominal tenderness. There is no guarding or rebound.  Neurological:     General: No focal deficit present.     Mental Status: She is alert and oriented to person, place, and time.  Psychiatric:        Mood and Affect: Mood normal.        Behavior: Behavior normal.      Results for orders placed or performed in visit on 08/09/23  CMP14+EGFR  Result Value Ref Range   Glucose 130 (H) 70 - 99 mg/dL   BUN 9 8 - 27 mg/dL   Creatinine, Ser 1.61 0.57 - 1.00 mg/dL   eGFR 67 >09 UE/AVW/0.98   BUN/Creatinine Ratio 10 (L) 12 - 28   Sodium 143 134 - 144 mmol/L   Potassium 3.6 3.5 - 5.2 mmol/L   Chloride 102 96 - 106 mmol/L   CO2 24 20 - 29 mmol/L   Calcium 8.6 (L) 8.7 - 10.3 mg/dL   Total Protein 6.0 6.0 - 8.5 g/dL   Albumin 3.3 (L) 3.9 - 4.9 g/dL   Globulin, Total 2.7 1.5 - 4.5 g/dL   Bilirubin Total 0.3 0.0 - 1.2 mg/dL   Alkaline Phosphatase  88 44 - 121 IU/L   AST 15 0 - 40 IU/L   ALT 10 0 - 32 IU/L  Lipid panel  Result Value Ref Range   Cholesterol, Total 152 100 - 199 mg/dL   Triglycerides 119 0 - 149 mg/dL   HDL 38 (L) >14 mg/dL   VLDL Cholesterol Cal 26 5 - 40 mg/dL   LDL Chol Calc (NIH) 88 0 - 99 mg/dL   Chol/HDL Ratio 4.0 0.0 - 4.4 ratio  CBC w/Diff/Platelet  Result Value Ref Range   WBC 6.2 3.4 - 10.8 x10E3/uL   RBC 4.02 3.77 - 5.28 x10E6/uL   Hemoglobin 11.5 11.1 - 15.9 g/dL   Hematocrit 78.2 95.6 - 46.6 %   MCV 92 79 - 97 fL   MCH 28.6 26.6 - 33.0 pg   MCHC 31.2 (L) 31.5 - 35.7 g/dL   RDW 21.3 08.6 - 57.8 %   Platelets 302 150 - 450 x10E3/uL   Neutrophils 66 Not Estab. %   Lymphs 24 Not Estab. %   Monocytes 5 Not Estab. %   Eos 3 Not Estab. %   Basos 1 Not Estab. %   Neutrophils Absolute 4.1 1.4 - 7.0 x10E3/uL   Lymphocytes Absolute 1.5 0.7 - 3.1 x10E3/uL   Monocytes Absolute 0.3 0.1 - 0.9 x10E3/uL   EOS (ABSOLUTE) 0.2 0.0 - 0.4 x10E3/uL   Basophils Absolute 0.0 0.0 - 0.2 x10E3/uL   Immature Granulocytes 1 Not Estab. %   Immature Grans (Abs) 0.0 0.0 - 0.1 x10E3/uL  TSH  Result Value Ref Range   TSH 0.751 0.450 - 4.500 uIU/mL  VITAMIN D 25 Hydroxy (Vit-D Deficiency, Fractures)  Result Value Ref Range   Vit D, 25-Hydroxy 25.6 (L) 30.0 - 100.0 ng/mL  B12 and Folate Panel  Result Value Ref Range   Vitamin B-12 511 232 - 1,245 pg/mL   Folate 6.8 >3.0 ng/mL  POCT HgB A1C  Result Value Ref Range   Hemoglobin A1C 6.5 (A) 4.0 - 5.6 %   HbA1c POC (<> result, manual entry)     HbA1c, POC (prediabetic range)     HbA1c, POC (controlled diabetic range)      The 10-year ASCVD risk score (Arnett DK, et al., 2019) is: 17.6% .Marland Kitchen   Assessment & Plan:  Marland KitchenMarland KitchenLilas was seen today for medical management of chronic issues.  Diagnoses and all orders for this visit:  Type 2 diabetes mellitus with diabetic neuropathy, with long-term current use of insulin (HCC) -     POCT HgB A1C -     CMP14+EGFR -     Lipid  panel -     CBC w/Diff/Platelet -     TSH -     Semaglutide, 1 MG/DOSE, (OZEMPIC, 1 MG/DOSE,) 4 MG/3ML SOPN; INJECT 1 MG AS DIRECTED ONCE A WEEK.  Chronic pain syndrome  Diabetic peripheral neuropathy (HCC) -     VITAMIN D 25 Hydroxy (Vit-D Deficiency, Fractures) -     B12 and Folate Panel -     DULoxetine (CYMBALTA) 60 MG capsule; Take 2 capsules (120 mg total) by mouth daily.  Hyperlipidemia LDL goal <70 -     Lipid panel  Class 3 severe obesity due to excess calories with serious comorbidity and body mass index (BMI) of 40.0 to 44.9 in adult (HCC) -     TSH  Vitamin D insufficiency -     VITAMIN D 25 Hydroxy (Vit-D Deficiency, Fractures)  Chronic rhinitis -     fluticasone (FLONASE) 50 MCG/ACT nasal spray; SHAKE LIQUID AND USE 2 SPRAYS IN EACH NOSTRIL DAILY  Viral gastroenteritis    Patient missed ENT appointment due to it being at the same time as this appointment. Reschedule appointment with ENT for further evaluation of sinus issues. Continue to try flonase, atrovent, and azelatine. Will refer to allergist and possibly allergy shots if no improvement after ENT visit.   Patient reports normal BM today. Stop Bentyl to see if BM and abdominal pain improve.   A1C is trending down at 6.5 today. Continue on 40u of insulin at night. Continue on other medications. Refill of Ozempic given. Patient down almost 9 lbs since march. Foot exam done today.  BP good today. Continue on medications.   Encouraged to reschedule sleep study to get new CPAP machine. Patient more willing to do at home sleep study than go into the lab. Will reach out to [facility] to see if this can be approved.   Plans to get COVID vaccine when she starts to feel better.   Refill of Cymbalta given.   CBC/B12/folate/CMP/lipid panel/TSH/vitamin D level labs today.  Return in about 3 months (around 11/09/2023).    Tandy Gaw, PA-C

## 2023-08-10 LAB — CMP14+EGFR
ALT: 10 [IU]/L (ref 0–32)
AST: 15 [IU]/L (ref 0–40)
Albumin: 3.3 g/dL — ABNORMAL LOW (ref 3.9–4.9)
Alkaline Phosphatase: 88 [IU]/L (ref 44–121)
BUN/Creatinine Ratio: 10 — ABNORMAL LOW (ref 12–28)
BUN: 9 mg/dL (ref 8–27)
Bilirubin Total: 0.3 mg/dL (ref 0.0–1.2)
CO2: 24 mmol/L (ref 20–29)
Calcium: 8.6 mg/dL — ABNORMAL LOW (ref 8.7–10.3)
Chloride: 102 mmol/L (ref 96–106)
Creatinine, Ser: 0.92 mg/dL (ref 0.57–1.00)
Globulin, Total: 2.7 g/dL (ref 1.5–4.5)
Glucose: 130 mg/dL — ABNORMAL HIGH (ref 70–99)
Potassium: 3.6 mmol/L (ref 3.5–5.2)
Sodium: 143 mmol/L (ref 134–144)
Total Protein: 6 g/dL (ref 6.0–8.5)
eGFR: 67 mL/min/{1.73_m2} (ref >59)

## 2023-08-10 LAB — TSH: TSH: 0.751 u[IU]/mL (ref 0.450–4.500)

## 2023-08-10 LAB — LIPID PANEL
Chol/HDL Ratio: 4 ratio (ref 0.0–4.4)
Cholesterol, Total: 152 mg/dL (ref 100–199)
HDL: 38 mg/dL — ABNORMAL LOW (ref >39)
LDL Chol Calc (NIH): 88 mg/dL (ref 0–99)
Triglycerides: 146 mg/dL (ref 0–149)
VLDL Cholesterol Cal: 26 mg/dL (ref 5–40)

## 2023-08-10 LAB — CBC WITH DIFFERENTIAL/PLATELET
Basophils Absolute: 0 10*3/uL (ref 0.0–0.2)
Basos: 1 %
EOS (ABSOLUTE): 0.2 10*3/uL (ref 0.0–0.4)
Eos: 3 %
Hematocrit: 36.9 % (ref 34.0–46.6)
Hemoglobin: 11.5 g/dL (ref 11.1–15.9)
Immature Grans (Abs): 0 10*3/uL (ref 0.0–0.1)
Immature Granulocytes: 1 %
Lymphocytes Absolute: 1.5 10*3/uL (ref 0.7–3.1)
Lymphs: 24 %
MCH: 28.6 pg (ref 26.6–33.0)
MCHC: 31.2 g/dL — ABNORMAL LOW (ref 31.5–35.7)
MCV: 92 fL (ref 79–97)
Monocytes Absolute: 0.3 10*3/uL (ref 0.1–0.9)
Monocytes: 5 %
Neutrophils Absolute: 4.1 10*3/uL (ref 1.4–7.0)
Neutrophils: 66 %
Platelets: 302 10*3/uL (ref 150–450)
RBC: 4.02 x10E6/uL (ref 3.77–5.28)
RDW: 12.8 % (ref 11.7–15.4)
WBC: 6.2 10*3/uL (ref 3.4–10.8)

## 2023-08-10 LAB — VITAMIN D 25 HYDROXY (VIT D DEFICIENCY, FRACTURES): Vit D, 25-Hydroxy: 25.6 ng/mL — ABNORMAL LOW (ref 30.0–100.0)

## 2023-08-10 LAB — B12 AND FOLATE PANEL
Folate: 6.8 ng/mL (ref >3.0)
Vitamin B-12: 511 pg/mL (ref 232–1245)

## 2023-08-11 ENCOUNTER — Encounter: Payer: Self-pay | Admitting: Physician Assistant

## 2023-08-11 NOTE — Progress Notes (Signed)
Angelle,   LDL very close to goal.  Vitamin D low. Was great last check, what changed?  Albumin low. Need more protein in diet.  Thyroid and B12 looks good.

## 2023-09-07 ENCOUNTER — Telehealth: Payer: Self-pay | Admitting: Physician Assistant

## 2023-09-07 DIAGNOSIS — G894 Chronic pain syndrome: Secondary | ICD-10-CM

## 2023-09-07 NOTE — Telephone Encounter (Signed)
Prescription Request  09/07/2023  LOV: 08/09/2023  What is the name of the medication or equipment? HYDROcodone-acetaminophen (NORCO) 10-325 MG tablet   Have you contacted your pharmacy to request a refill? Yes   Which pharmacy would you like this sent to?  [id number] - [place], [place] - [address] [place]  [id number] Phone: [phone] Fax: [phone]      Patient notified that their request is being sent to the clinical staff for review and that they should receive a response within 2 business days.   Please advise at [facility] [phone]

## 2023-09-11 MED ORDER — HYDROCODONE-ACETAMINOPHEN 10-325 MG PO TABS: 1.0000 | ORAL_TABLET | Freq: Two times a day (BID) | ORAL | 0 refills | Status: DC

## 2023-09-11 NOTE — Telephone Encounter (Signed)
..  PDMP reviewed during this encounter. No concerns Sent norco UTD appt

## 2023-09-25 ENCOUNTER — Other Ambulatory Visit: Payer: Self-pay | Admitting: Physician Assistant

## 2023-09-25 DIAGNOSIS — E1140 Type 2 diabetes mellitus with diabetic neuropathy, unspecified: Secondary | ICD-10-CM

## 2023-09-29 DIAGNOSIS — G4730 Sleep apnea, unspecified: Secondary | ICD-10-CM | POA: Diagnosis not present

## 2023-09-29 DIAGNOSIS — E669 Obesity, unspecified: Secondary | ICD-10-CM | POA: Diagnosis not present

## 2023-09-29 DIAGNOSIS — H547 Unspecified visual loss: Secondary | ICD-10-CM | POA: Diagnosis not present

## 2023-09-29 DIAGNOSIS — N189 Chronic kidney disease, unspecified: Secondary | ICD-10-CM | POA: Diagnosis not present

## 2023-09-29 DIAGNOSIS — N183 Chronic kidney disease, stage 3 unspecified: Secondary | ICD-10-CM | POA: Diagnosis not present

## 2023-09-29 DIAGNOSIS — R110 Nausea: Secondary | ICD-10-CM | POA: Diagnosis not present

## 2023-09-29 DIAGNOSIS — R9431 Abnormal electrocardiogram [ECG] [EKG]: Secondary | ICD-10-CM | POA: Diagnosis not present

## 2023-09-29 DIAGNOSIS — K219 Gastro-esophageal reflux disease without esophagitis: Secondary | ICD-10-CM | POA: Diagnosis not present

## 2023-09-29 DIAGNOSIS — R55 Syncope and collapse: Secondary | ICD-10-CM | POA: Diagnosis not present

## 2023-09-29 DIAGNOSIS — E1122 Type 2 diabetes mellitus with diabetic chronic kidney disease: Secondary | ICD-10-CM | POA: Diagnosis not present

## 2023-09-29 DIAGNOSIS — I959 Hypotension, unspecified: Secondary | ICD-10-CM | POA: Diagnosis not present

## 2023-09-29 DIAGNOSIS — J449 Chronic obstructive pulmonary disease, unspecified: Secondary | ICD-10-CM | POA: Diagnosis not present

## 2023-09-29 DIAGNOSIS — I129 Hypertensive chronic kidney disease with stage 1 through stage 4 chronic kidney disease, or unspecified chronic kidney disease: Secondary | ICD-10-CM | POA: Diagnosis not present

## 2023-09-29 DIAGNOSIS — N3000 Acute cystitis without hematuria: Secondary | ICD-10-CM | POA: Diagnosis not present

## 2023-09-29 DIAGNOSIS — N3090 Cystitis, unspecified without hematuria: Secondary | ICD-10-CM | POA: Diagnosis not present

## 2023-09-29 DIAGNOSIS — I517 Cardiomegaly: Secondary | ICD-10-CM | POA: Diagnosis not present

## 2023-09-29 DIAGNOSIS — R531 Weakness: Secondary | ICD-10-CM | POA: Diagnosis not present

## 2023-09-29 DIAGNOSIS — R9082 White matter disease, unspecified: Secondary | ICD-10-CM | POA: Diagnosis not present

## 2023-10-03 ENCOUNTER — Ambulatory Visit (INDEPENDENT_AMBULATORY_CARE_PROVIDER_SITE_OTHER): Payer: Medicare HMO | Admitting: Physician Assistant

## 2023-10-03 ENCOUNTER — Encounter: Payer: Self-pay | Admitting: Physician Assistant

## 2023-10-03 VITALS — BP 124/44 | Resp 14 | Ht 63.0 in | Wt 251.7 lb

## 2023-10-03 DIAGNOSIS — R55 Syncope and collapse: Secondary | ICD-10-CM

## 2023-10-03 DIAGNOSIS — D72829 Elevated white blood cell count, unspecified: Secondary | ICD-10-CM

## 2023-10-03 DIAGNOSIS — E785 Hyperlipidemia, unspecified: Secondary | ICD-10-CM | POA: Diagnosis not present

## 2023-10-03 DIAGNOSIS — Z794 Long term (current) use of insulin: Secondary | ICD-10-CM

## 2023-10-03 DIAGNOSIS — E1140 Type 2 diabetes mellitus with diabetic neuropathy, unspecified: Secondary | ICD-10-CM | POA: Diagnosis not present

## 2023-10-03 DIAGNOSIS — N179 Acute kidney failure, unspecified: Secondary | ICD-10-CM | POA: Diagnosis not present

## 2023-10-03 DIAGNOSIS — R0602 Shortness of breath: Secondary | ICD-10-CM

## 2023-10-03 DIAGNOSIS — N3001 Acute cystitis with hematuria: Secondary | ICD-10-CM | POA: Diagnosis not present

## 2023-10-03 MED ORDER — FUROSEMIDE 20 MG PO TABS
ORAL_TABLET | ORAL | 1 refills | Status: DC
Start: 2023-10-03 — End: 2024-04-22

## 2023-10-03 NOTE — Progress Notes (Signed)
Established Patient Office Visit  Subjective   Patient ID: [name] Rice, female    DOB: 08-Dec-1955  Age: 67 y.o. MRN: [id number]  Chief Complaint  Patient presents with   Hospitalization Follow-up    HPI Pt is a 67 yo obese female who presents to the clinic with her daughter to follow up on 12/13 ED visit for syncope and collapse with acute cystitis and decreased GFR.   Pt was transported by EMS due to syncope and collapse. She feels like she collapsed due to the pain just after eating ham.   WBC 11.9 GFR 40 UA positive for leukocytes CT head nor acute abnormality CXR no acute abnormality  Pt was given cipro for acute cystitis and discharged. Pt is feeling better today but worried about her GFR.   She denies any abdominal pain or worsening nausea.   .. Active Ambulatory Problems    Diagnosis Date Noted   Type II or unspecified type diabetes mellitus with neurological manifestations, not stated as uncontrolled(250.60) 08/08/2006   Hyperlipidemia LDL goal <70 08/08/2006   Depression with anxiety 01/28/2008   Hypertension associated with diabetes (HCC) 08/08/2006   Allergic sinusitis 05/27/2009   GERD 08/08/2006   Acute bilateral low back pain 11/27/2007   OSA on CPAP    Insomnia 02/06/2011   Preventative health care 08/16/2011   History of allergic rhinitis 09/07/2011   Iron deficiency anemia 03/29/2012   Lipoma of abdominal wall 05/01/2012   Intestinal bacterial overgrowth 09/20/2012   SOB (shortness of breath) on exertion 03/31/2013   Hepatic steatosis 09/26/2013   Diverticulitis 11/27/2013   Diabetic peripheral neuropathy (HCC) 05/30/2014   Gastroparesis due to secondary diabetes (HCC) 09/16/2014   Ear pain 12/09/2014   Type 2 diabetes, controlled, with peripheral neuropathy (HCC) 12/09/2014   Diarrhea 01/12/2015   Non-proliferative diabetic retinopathy, right eye (HCC) 11/23/2015   Chronic pain syndrome 12/15/2015   Morbid obesity due to excess calories (HCC)  02/16/2016   Unstable balance 02/16/2016   Intertrigo 03/15/2016   Left knee pain 03/15/2016   Post-menopausal 03/16/2016   Acute reaction to stress 03/28/2016   Dystonia 09/21/2016   Neck pain 07/11/2017   Lisfranc's dislocation, right, initial encounter 10/06/2017   Freiberg's disease, right 10/06/2017   Vasomotor rhinitis 11/28/2017   Ear itching 01/17/2018   No energy 01/17/2018   Hordeolum externum of right lower eyelid 02/16/2018   Hot flashes 02/16/2018   Restrictive lung disease 04/12/2018   Mixed hyperlipidemia 04/12/2018   Vitamin D insufficiency 04/12/2018   Drug-induced constipation 05/23/2018   Calcification and ossification of muscle 07/13/2018   Pressure injury of right foot, unstageable (HCC) 09/25/2018   Bilateral carpal tunnel syndrome post carpal tunnel release on the left 01/24/2019   Type 2 diabetes mellitus with diabetic neuropathy, with long-term current use of insulin (HCC) 06/20/2019   Dizziness 06/20/2019   Acute right ankle pain 09/25/2019   OAB (overactive bladder) 09/25/2019   Chronic kidney disease (CKD), stage III (moderate) (HCC) 10/04/2019   Trigger finger, right middle finger 12/24/2019   Trigger index finger of right hand 12/24/2019   DDD (degenerative disc disease), cervical 12/27/2019   Bilateral rotator cuff syndrome 01/24/2020   Paresthesia 06/30/2020   Weakness 06/30/2020   Chronic fatigue 06/30/2020   Moderate episode of recurrent major depressive disorder (HCC) 07/14/2020   Left ventricular hypertrophy 07/23/2020   Problems with swallowing 03/22/2021   Allergic conjunctivitis of both eyes 03/22/2021   Rosacea 04/02/2021   Chronic right shoulder pain 04/02/2021  Partial nontraumatic tear of left rotator cuff 08/16/2021   Osteopenia 12/30/2021   Blood clot in eye 12/31/2021   Diminished pulses in lower extremity 04/12/2022   Bilateral leg pain 07/27/2022   Itching 07/27/2022   Clostridioides difficile infection 09/05/2022    Fistula of sigmoid colon 09/05/2022   Acute left-sided low back pain with left-sided sciatica 09/05/2022   Tubular adenoma of colon 09/16/2022   Cellulitis 01/06/2023   Fungal dermatitis 01/06/2023   OSA (obstructive sleep apnea) 05/15/2023   Chronic rhinitis 05/15/2023   Class 3 severe obesity due to excess calories with serious comorbidity and body mass index (BMI) of 40.0 to 44.9 in adult (HCC) 05/15/2023   Seasonal allergic rhinitis due to pollen 05/15/2023   Essential hypertension 05/15/2023   AKI (acute kidney injury) (HCC) 10/03/2023   Syncope and collapse 10/03/2023   Acute cystitis with hematuria 10/09/2023   Leukocytosis 10/09/2023   Resolved Ambulatory Problems    Diagnosis Date Noted   IRON DEFICIENCY 01/30/2007   DEPRESSION 08/08/2006   TORTICOLLIS, SPASMODIC 02/13/2007   ALLERGIC RHINITIS 12/25/2006   ASTHMA 01/28/2008   COPD 08/08/2006   DIVERTICULOSIS, COLON 02/12/2003   DERMATITIS, SEBORRHEIC 01/30/2007   BPPV (benign paroxysmal positional vertigo), unspecified laterality 07/14/2009   GOUT, HX OF 08/08/2006   History of colonic polyps 09/16/2008   NEPHROLITHIASIS, HX OF 01/28/2008   Calf pain 11/29/2010   Tremor, essential 02/06/2011   Seborrheic dermatitis, unspecified 04/06/2011   Finger pain 04/06/2011   Shoulder pain, right 08/16/2011   Left leg pain 04/10/2012   Right ear pain 05/01/2012   Seborrheic dermatitis 05/01/2012   Hypoglycemia 05/01/2012   Vaginal candidiasis 08/22/2012   Sigmoid diverticulitis 09/15/2012   Acute-on-chronic kidney disease Stage II 09/15/2012   Cyst, breast 03/01/2013   Lactic acidosis 03/31/2013   Acute renal insufficiency 03/31/2013   Abdominal pain, left lower quadrant 04/29/2013   Gout 05/01/2013   COPD (chronic obstructive pulmonary disease) with chronic bronchitis (HCC) 12/09/2014   Right shoulder pain 02/16/2016   Fall 03/16/2016   Foot fracture, right, closed, initial encounter 08/23/2017   Chronic pain of both  shoulders 12/24/2019   Past Medical History:  Diagnosis Date   Allergy    Anxiety    Arthritis    Asthma    Carpal tunnel syndrome, bilateral    Cataract 2020   Cervical dystonia    COPD (chronic obstructive pulmonary disease) (HCC)    Depression    Diabetes mellitus    Diverticulosis    Gastroparesis    GERD (gastroesophageal reflux disease)    History of kidney stones    History of seborrheic dermatitis    HLD (hyperlipidemia)    Hypertension    Intussusception (HCC)    Lisfranc's sprain, left, initial encounter 10/06/2017   Lumbar pain 2011   Morbid obesity (HCC)    Nephrolithiasis    Osteoporosis 2018   Pneumonia    Recurrent boils    Sleep apnea    Torn rotator cuff RT SHOULDER   Tremor CCENTRAL NERVOUS SYSTEM       ROS See HPI.    Objective:     BP (!) 124/44 (BP Location: Right Arm, Patient Position: Sitting)   Resp 14   Ht 5\' 3"  (1.6 m)   Wt 251 lb 11.2 oz (114.2 kg)   LMP 10/18/1995   SpO2 98%   BMI 44.59 kg/m  BP Readings from Last 3 Encounters:  10/03/23 (!) 124/44  08/09/23 (!) 117/59  08/04/23 116/66  Wt Readings from Last 3 Encounters:  10/03/23 251 lb 11.2 oz (114.2 kg)  08/09/23 250 lb (113.4 kg)  01/06/23 260 lb (117.9 kg)      Physical Exam Constitutional:      Appearance: Normal appearance. She is obese.  HENT:     Head: Normocephalic.  Cardiovascular:     Rate and Rhythm: Normal rate and regular rhythm.  Pulmonary:     Effort: Pulmonary effort is normal.     Breath sounds: Normal breath sounds.  Abdominal:     General: There is no distension.     Palpations: Abdomen is soft. There is no mass.     Tenderness: There is no abdominal tenderness. There is no right CVA tenderness, left CVA tenderness, guarding or rebound.     Hernia: No hernia is present.  Neurological:     General: No focal deficit present.     Mental Status: She is alert and oriented to person, place, and time.  Psychiatric:        Mood and Affect:  Mood normal.     .. Results for orders placed or performed in visit on 10/03/23  CMP14+EGFR   Collection Time: 10/03/23 11:36 AM  Result Value Ref Range   Glucose 180 (H) 70 - 99 mg/dL   BUN 19 8 - 27 mg/dL   Creatinine, Ser 1.61 (H) 0.57 - 1.00 mg/dL   eGFR 52 (L) >09 UE/AVW/0.98   BUN/Creatinine Ratio 17 12 - 28   Sodium 145 (H) 134 - 144 mmol/L   Potassium 4.2 3.5 - 5.2 mmol/L   Chloride 105 96 - 106 mmol/L   CO2 21 20 - 29 mmol/L   Calcium 9.3 8.7 - 10.3 mg/dL   Total Protein 6.2 6.0 - 8.5 g/dL   Albumin 3.6 (L) 3.9 - 4.9 g/dL   Globulin, Total 2.6 1.5 - 4.5 g/dL   Bilirubin Total 0.5 0.0 - 1.2 mg/dL   Alkaline Phosphatase 212 (H) 44 - 121 IU/L   AST 43 (H) 0 - 40 IU/L   ALT 101 (H) 0 - 32 IU/L  CBC w/Diff/Platelet   Collection Time: 10/03/23 11:36 AM  Result Value Ref Range   WBC 6.6 3.4 - 10.8 x10E3/uL   RBC 4.22 3.77 - 5.28 x10E6/uL   Hemoglobin 12.2 11.1 - 15.9 g/dL   Hematocrit 11.9 14.7 - 46.6 %   MCV 94 79 - 97 fL   MCH 28.9 26.6 - 33.0 pg   MCHC 30.8 (L) 31.5 - 35.7 g/dL   RDW 82.9 56.2 - 13.0 %   Platelets 233 150 - 450 x10E3/uL   Neutrophils 60 Not Estab. %   Lymphs 27 Not Estab. %   Monocytes 6 Not Estab. %   Eos 5 Not Estab. %   Basos 1 Not Estab. %   Neutrophils Absolute 4.0 1.4 - 7.0 x10E3/uL   Lymphocytes Absolute 1.8 0.7 - 3.1 x10E3/uL   Monocytes Absolute 0.4 0.1 - 0.9 x10E3/uL   EOS (ABSOLUTE) 0.3 0.0 - 0.4 x10E3/uL   Basophils Absolute 0.0 0.0 - 0.2 x10E3/uL   Immature Granulocytes 1 Not Estab. %   Immature Grans (Abs) 0.0 0.0 - 0.1 x10E3/uL  Urine Culture   Collection Time: 10/03/23  2:00 PM   Specimen: Urine   UR  Result Value Ref Range   Urine Culture, Routine Final report    Organism ID, Bacteria Comment    *Note: Due to a large number of results and/or encounters for the requested time period,  some results have not been displayed. A complete set of results can be found in Results Review.      The 10-year ASCVD risk score  (Arnett DK, et al., 2019) is: 36.9%    Assessment & Plan:  Marland KitchenMarland KitchenEloni was seen today for hospitalization follow-up.  Diagnoses and all orders for this visit:  Acute cystitis with hematuria -     CMP14+EGFR -     CBC w/Diff/Platelet -     Urine Culture  Type 2 diabetes mellitus with diabetic neuropathy, with long-term current use of insulin (HCC)  Hyperlipidemia LDL goal <70  AKI (acute kidney injury) (HCC) -     CMP14+EGFR -     CBC w/Diff/Platelet  Syncope and collapse  SOB (shortness of breath) on exertion -     furosemide (LASIX) 20 MG tablet; TAKE 1 TABLET TWICE DAILY  Leukocytosis, unspecified type -     CBC w/Diff/Platelet -     Urine Culture   Finish Cipro for UTI UA dipstick improved, will culture CMP to recheck GFR which previously has not been that low CBC to recheck WBC Pt denies any symptoms today and improving ? If pain and nausea caused sycope Follow up if occurs again    Tandy Gaw, PA-C

## 2023-10-03 NOTE — Patient Instructions (Signed)
Stay hydrated  Chronic Kidney Disease, Adult Chronic kidney disease (CKD) occurs when the kidneys are slowly and permanently damaged over a long period of time. The kidneys are a pair of organs that do many important jobs in the body, including: Removing waste and extra fluid from the blood to make urine. Making hormones that maintain the amount of fluid in tissues and blood vessels. Maintaining the right amount of fluids and chemicals in the body. A small amount of kidney damage may not cause problems, but a large amount of damage may make it hard or impossible for the kidneys to work right. Steps must be taken to slow kidney damage or to stop it from getting worse. If steps are not taken, the kidneys may stop working permanently (end-stage renal disease, or ESRD). Most of the time, CKD does not go away, but it can often be controlled. People who have CKD are usually able to live full lives. What are the causes? The most common causes of this condition are diabetes and high blood pressure (hypertension). Other causes include: Cardiovascular diseases. These affect the heart and blood vessels. Kidney diseases. These include: Glomerulonephritis, or inflammation of the tiny filters in the kidneys. Interstitial nephritis. This is swelling of the small tubes of the kidneys and of the surrounding structures. Polycystic kidney disease, in which clusters of fluid-filled sacs form within the kidneys. Renal vascular disease. This includes disorders that affect the arteries and veins of the kidneys. Diseases that affect the body's defense system (immune system). A problem with urine flow. This may be caused by: Kidney stones. Cancer. An enlarged prostate, in males. A kidney infection or urinary tract infection (UTI) that keeps coming back. Vasculitis. This is swelling or inflammation of the blood vessels. What increases the risk? Your chances of having kidney disease increase with age. The following  factors may make you more likely to develop this condition: A family history of kidney disease or kidney failure. Kidney failure means the kidneys can no longer work right. Certain genetic diseases. Taking medicines often that are damaging to the kidneys. Being around or being in contact with toxic substances. Obesity. A history of tobacco use. What are the signs or symptoms? Symptoms of this condition include: Feeling very tired (lethargic) and having less energy. Swelling, or edema, of the face, legs, ankles, or feet. Nausea or vomiting, or loss of appetite. Confusion or trouble concentrating. Muscle twitches and cramps, especially in the legs. Dry, itchy skin. A metallic taste in the mouth. Producing less urine, or producing more urine (especially at night). Shortness of breath. Trouble sleeping. CKD may also result in not having enough red blood cells or hemoglobin in the blood (anemia) or having weak bones (bone disease). Symptoms develop slowly and may not be obvious until the kidney damage becomes severe. It is possible to have kidney disease for years without having symptoms. How is this diagnosed? This condition may be diagnosed based on: Blood tests. Urine tests. Imaging tests, such as an ultrasound or a CT scan. A kidney biopsy. This involves removing a sample of kidney tissue to be looked at under a microscope. Results from these tests will help to determine how serious the CKD is. How is this treated? There is no cure for most cases of this condition, but treatment usually relieves symptoms and prevents or slows the worsening of the disease. Treatment may include: Diet changes, which may require you to avoid alcohol and foods that are high in salt, potassium, phosphorous, and protein.  Medicines. These may: Lower blood pressure. Control blood sugar (glucose). Relieve anemia. Relieve swelling. Protect your bones. Improve the balance of salts and minerals in your blood  (electrolytes). Dialysis, which is a type of treatment that removes toxic waste from the body. It may be needed if you have kidney failure. Managing any other conditions that are causing your CKD or making it worse. Follow these instructions at home: Medicines Take over-the-counter and prescription medicines only as told by your health care provider. The amount of some medicines that you take may need to be changed. Do not take any new medicines unless approved by your health care provider. Many medicines can make kidney damage worse. Do not take any vitamin and mineral supplements unless approved by your health care provider. Many nutritional supplements can make kidney damage worse. Lifestyle  Do not use any products that contain nicotine or tobacco, such as cigarettes, e-cigarettes, and chewing tobacco. If you need help quitting, ask your health care provider. If you drink alcohol: Limit how much you use to: 0-1 drink a day for women who are not pregnant. 0-2 drinks a day for men. Know how much alcohol is in your drink. In the [place] one drink equals one 12 oz bottle of beer (355 mL), one 5 oz glass of wine (148 mL), or one 1 oz glass of hard liquor (44 mL). Maintain a healthy weight. If you need help, ask your health care provider. General instructions  Follow instructions from your health care provider about eating or drinking restrictions, including any prescribed diet. Track your blood pressure at home. Report changes in your blood pressure as told. If you are being treated for diabetes, track your blood glucose levels as told. Start or continue an exercise plan. Exercise at least 30 minutes a day, 5 days a week. Keep your immunizations up to date as told. Keep all follow-up visits. This is important. Where to find more information American Association of Kidney Patients: [employer].is [employer]: www.kidney.org American Kidney Fund: FightingMatch.com.ee Life Options:  www.lifeoptions.org Kidney School: www.kidneyschool.org Contact a health care provider if: Your symptoms get worse. You develop new symptoms. Get help right away if: You develop symptoms of ESRD. These include: Headaches. Numbness in your hands or feet. Easy bruising. Frequent hiccups. Chest pain. Shortness of breath. Lack of menstrual periods, in women. You have a fever. You are producing less urine than usual. You have pain or bleeding when you urinate or when you have a bowel movement. These symptoms may represent a serious problem that is an emergency. Do not wait to see if the symptoms will go away. Get medical help right away. Call your local emergency services (911 in the [place] Do not drive yourself to the hospital. Summary Chronic kidney disease (CKD) occurs when the kidneys become damaged slowly over a long period of time. The most common causes of this condition are diabetes and high blood pressure (hypertension). There is no cure for most cases of CKD, but treatment usually relieves symptoms and prevents or slows the worsening of the disease. Treatment may include a combination of lifestyle changes, medicines, and dialysis. This information is not intended to replace advice given to you by your health care provider. Make sure you discuss any questions you have with your health care provider. Document Revised: 01/03/2020 Document Reviewed: 01/08/2020 Elsevier Patient Education  2024 [employer].

## 2023-10-04 LAB — CMP14+EGFR
ALT: 101 [IU]/L — ABNORMAL HIGH (ref 0–32)
AST: 43 [IU]/L — ABNORMAL HIGH (ref 0–40)
Albumin: 3.6 g/dL — ABNORMAL LOW (ref 3.9–4.9)
Alkaline Phosphatase: 212 [IU]/L — ABNORMAL HIGH (ref 44–121)
BUN/Creatinine Ratio: 17 (ref 12–28)
BUN: 19 mg/dL (ref 8–27)
Bilirubin Total: 0.5 mg/dL (ref 0.0–1.2)
CO2: 21 mmol/L (ref 20–29)
Calcium: 9.3 mg/dL (ref 8.7–10.3)
Chloride: 105 mmol/L (ref 96–106)
Creatinine, Ser: 1.15 mg/dL — ABNORMAL HIGH (ref 0.57–1.00)
Globulin, Total: 2.6 g/dL (ref 1.5–4.5)
Glucose: 180 mg/dL — ABNORMAL HIGH (ref 70–99)
Potassium: 4.2 mmol/L (ref 3.5–5.2)
Sodium: 145 mmol/L — ABNORMAL HIGH (ref 134–144)
Total Protein: 6.2 g/dL (ref 6.0–8.5)
eGFR: 52 mL/min/{1.73_m2} — ABNORMAL LOW (ref >59)

## 2023-10-04 LAB — CBC WITH DIFFERENTIAL/PLATELET
Basophils Absolute: 0 10*3/uL (ref 0.0–0.2)
Basos: 1 %
EOS (ABSOLUTE): 0.3 10*3/uL (ref 0.0–0.4)
Eos: 5 %
Hematocrit: 39.6 % (ref 34.0–46.6)
Hemoglobin: 12.2 g/dL (ref 11.1–15.9)
Immature Grans (Abs): 0 10*3/uL (ref 0.0–0.1)
Immature Granulocytes: 1 %
Lymphocytes Absolute: 1.8 10*3/uL (ref 0.7–3.1)
Lymphs: 27 %
MCH: 28.9 pg (ref 26.6–33.0)
MCHC: 30.8 g/dL — ABNORMAL LOW (ref 31.5–35.7)
MCV: 94 fL (ref 79–97)
Monocytes Absolute: 0.4 10*3/uL (ref 0.1–0.9)
Monocytes: 6 %
Neutrophils Absolute: 4 10*3/uL (ref 1.4–7.0)
Neutrophils: 60 %
Platelets: 233 10*3/uL (ref 150–450)
RBC: 4.22 x10E6/uL (ref 3.77–5.28)
RDW: 13.2 % (ref 11.7–15.4)
WBC: 6.6 10*3/uL (ref 3.4–10.8)

## 2023-10-04 NOTE — Progress Notes (Signed)
[name] Rice,   GFR still down from one month ago but much better than when in hospital.  Likely some viral hepatitis with liver enzymes elevated. Recheck in 2 weeks.

## 2023-10-05 LAB — URINE CULTURE

## 2023-10-05 NOTE — Progress Notes (Signed)
[name] Rice, your urine culture is negative which is great.

## 2023-10-06 ENCOUNTER — Telehealth: Payer: Self-pay | Admitting: Emergency Medicine

## 2023-10-06 NOTE — Telephone Encounter (Signed)
-----   Message from Nani Gasser sent at 10/05/2023  4:13 PM EST ----- [name], your urine culture is negative which is great.

## 2023-10-06 NOTE — Progress Notes (Unsigned)
Called patient no answer left a voice message to call us back

## 2023-10-08 DIAGNOSIS — K219 Gastro-esophageal reflux disease without esophagitis: Secondary | ICD-10-CM | POA: Diagnosis not present

## 2023-10-08 DIAGNOSIS — Z6841 Body Mass Index (BMI) 40.0 and over, adult: Secondary | ICD-10-CM | POA: Diagnosis not present

## 2023-10-08 DIAGNOSIS — K8021 Calculus of gallbladder without cholecystitis with obstruction: Secondary | ICD-10-CM | POA: Diagnosis not present

## 2023-10-08 DIAGNOSIS — K8012 Calculus of gallbladder with acute and chronic cholecystitis without obstruction: Secondary | ICD-10-CM | POA: Diagnosis not present

## 2023-10-08 DIAGNOSIS — I129 Hypertensive chronic kidney disease with stage 1 through stage 4 chronic kidney disease, or unspecified chronic kidney disease: Secondary | ICD-10-CM | POA: Diagnosis not present

## 2023-10-08 DIAGNOSIS — F330 Major depressive disorder, recurrent, mild: Secondary | ICD-10-CM | POA: Diagnosis not present

## 2023-10-08 DIAGNOSIS — K760 Fatty (change of) liver, not elsewhere classified: Secondary | ICD-10-CM | POA: Diagnosis not present

## 2023-10-08 DIAGNOSIS — K6389 Other specified diseases of intestine: Secondary | ICD-10-CM | POA: Diagnosis not present

## 2023-10-08 DIAGNOSIS — J449 Chronic obstructive pulmonary disease, unspecified: Secondary | ICD-10-CM | POA: Diagnosis not present

## 2023-10-08 DIAGNOSIS — K8033 Calculus of bile duct with acute cholangitis with obstruction: Secondary | ICD-10-CM | POA: Diagnosis not present

## 2023-10-08 DIAGNOSIS — K8020 Calculus of gallbladder without cholecystitis without obstruction: Secondary | ICD-10-CM | POA: Diagnosis not present

## 2023-10-08 DIAGNOSIS — G4733 Obstructive sleep apnea (adult) (pediatric): Secondary | ICD-10-CM | POA: Diagnosis not present

## 2023-10-08 DIAGNOSIS — K8309 Other cholangitis: Secondary | ICD-10-CM | POA: Diagnosis not present

## 2023-10-08 DIAGNOSIS — K3189 Other diseases of stomach and duodenum: Secondary | ICD-10-CM | POA: Diagnosis not present

## 2023-10-08 DIAGNOSIS — K8050 Calculus of bile duct without cholangitis or cholecystitis without obstruction: Secondary | ICD-10-CM | POA: Diagnosis not present

## 2023-10-08 DIAGNOSIS — N3289 Other specified disorders of bladder: Secondary | ICD-10-CM | POA: Diagnosis not present

## 2023-10-08 DIAGNOSIS — K5732 Diverticulitis of large intestine without perforation or abscess without bleeding: Secondary | ICD-10-CM | POA: Diagnosis not present

## 2023-10-08 DIAGNOSIS — E785 Hyperlipidemia, unspecified: Secondary | ICD-10-CM | POA: Diagnosis not present

## 2023-10-08 DIAGNOSIS — E1122 Type 2 diabetes mellitus with diabetic chronic kidney disease: Secondary | ICD-10-CM | POA: Diagnosis not present

## 2023-10-08 DIAGNOSIS — I10 Essential (primary) hypertension: Secondary | ICD-10-CM | POA: Diagnosis not present

## 2023-10-08 DIAGNOSIS — K5792 Diverticulitis of intestine, part unspecified, without perforation or abscess without bleeding: Secondary | ICD-10-CM | POA: Diagnosis not present

## 2023-10-08 DIAGNOSIS — N183 Chronic kidney disease, stage 3 unspecified: Secondary | ICD-10-CM | POA: Diagnosis not present

## 2023-10-08 DIAGNOSIS — N1831 Chronic kidney disease, stage 3a: Secondary | ICD-10-CM | POA: Diagnosis not present

## 2023-10-08 DIAGNOSIS — N3000 Acute cystitis without hematuria: Secondary | ICD-10-CM | POA: Diagnosis not present

## 2023-10-08 DIAGNOSIS — K810 Acute cholecystitis: Secondary | ICD-10-CM | POA: Diagnosis not present

## 2023-10-08 DIAGNOSIS — E1142 Type 2 diabetes mellitus with diabetic polyneuropathy: Secondary | ICD-10-CM | POA: Diagnosis not present

## 2023-10-09 ENCOUNTER — Encounter: Payer: Self-pay | Admitting: Physician Assistant

## 2023-10-09 DIAGNOSIS — D72829 Elevated white blood cell count, unspecified: Secondary | ICD-10-CM | POA: Insufficient documentation

## 2023-10-09 DIAGNOSIS — K5792 Diverticulitis of intestine, part unspecified, without perforation or abscess without bleeding: Secondary | ICD-10-CM | POA: Diagnosis not present

## 2023-10-09 DIAGNOSIS — K8050 Calculus of bile duct without cholangitis or cholecystitis without obstruction: Secondary | ICD-10-CM | POA: Diagnosis not present

## 2023-10-09 DIAGNOSIS — I10 Essential (primary) hypertension: Secondary | ICD-10-CM | POA: Diagnosis not present

## 2023-10-09 DIAGNOSIS — N3001 Acute cystitis with hematuria: Secondary | ICD-10-CM | POA: Insufficient documentation

## 2023-10-10 DIAGNOSIS — K8050 Calculus of bile duct without cholangitis or cholecystitis without obstruction: Secondary | ICD-10-CM | POA: Diagnosis not present

## 2023-10-10 DIAGNOSIS — K8020 Calculus of gallbladder without cholecystitis without obstruction: Secondary | ICD-10-CM | POA: Diagnosis not present

## 2023-10-10 DIAGNOSIS — N1831 Chronic kidney disease, stage 3a: Secondary | ICD-10-CM | POA: Diagnosis not present

## 2023-10-10 DIAGNOSIS — I129 Hypertensive chronic kidney disease with stage 1 through stage 4 chronic kidney disease, or unspecified chronic kidney disease: Secondary | ICD-10-CM | POA: Diagnosis not present

## 2023-10-10 DIAGNOSIS — K5792 Diverticulitis of intestine, part unspecified, without perforation or abscess without bleeding: Secondary | ICD-10-CM | POA: Diagnosis not present

## 2023-10-10 DIAGNOSIS — E1122 Type 2 diabetes mellitus with diabetic chronic kidney disease: Secondary | ICD-10-CM | POA: Diagnosis not present

## 2023-10-11 DIAGNOSIS — K8050 Calculus of bile duct without cholangitis or cholecystitis without obstruction: Secondary | ICD-10-CM | POA: Diagnosis not present

## 2023-10-11 DIAGNOSIS — E1122 Type 2 diabetes mellitus with diabetic chronic kidney disease: Secondary | ICD-10-CM | POA: Diagnosis not present

## 2023-10-11 DIAGNOSIS — I129 Hypertensive chronic kidney disease with stage 1 through stage 4 chronic kidney disease, or unspecified chronic kidney disease: Secondary | ICD-10-CM | POA: Diagnosis not present

## 2023-10-11 DIAGNOSIS — N1831 Chronic kidney disease, stage 3a: Secondary | ICD-10-CM | POA: Diagnosis not present

## 2023-10-11 DIAGNOSIS — K5792 Diverticulitis of intestine, part unspecified, without perforation or abscess without bleeding: Secondary | ICD-10-CM | POA: Diagnosis not present

## 2023-10-12 DIAGNOSIS — K810 Acute cholecystitis: Secondary | ICD-10-CM | POA: Diagnosis not present

## 2023-10-12 DIAGNOSIS — N1831 Chronic kidney disease, stage 3a: Secondary | ICD-10-CM | POA: Diagnosis not present

## 2023-10-12 DIAGNOSIS — E1122 Type 2 diabetes mellitus with diabetic chronic kidney disease: Secondary | ICD-10-CM | POA: Diagnosis not present

## 2023-10-12 DIAGNOSIS — K8012 Calculus of gallbladder with acute and chronic cholecystitis without obstruction: Secondary | ICD-10-CM | POA: Diagnosis not present

## 2023-10-12 DIAGNOSIS — K5792 Diverticulitis of intestine, part unspecified, without perforation or abscess without bleeding: Secondary | ICD-10-CM | POA: Diagnosis not present

## 2023-10-12 DIAGNOSIS — I129 Hypertensive chronic kidney disease with stage 1 through stage 4 chronic kidney disease, or unspecified chronic kidney disease: Secondary | ICD-10-CM | POA: Diagnosis not present

## 2023-10-12 DIAGNOSIS — K8050 Calculus of bile duct without cholangitis or cholecystitis without obstruction: Secondary | ICD-10-CM | POA: Diagnosis not present

## 2023-10-12 HISTORY — PX: CHOLECYSTECTOMY: SHX55

## 2023-10-13 DIAGNOSIS — K8050 Calculus of bile duct without cholangitis or cholecystitis without obstruction: Secondary | ICD-10-CM | POA: Diagnosis not present

## 2023-10-13 DIAGNOSIS — N1831 Chronic kidney disease, stage 3a: Secondary | ICD-10-CM | POA: Diagnosis not present

## 2023-10-13 DIAGNOSIS — K5792 Diverticulitis of intestine, part unspecified, without perforation or abscess without bleeding: Secondary | ICD-10-CM | POA: Diagnosis not present

## 2023-10-13 DIAGNOSIS — E1122 Type 2 diabetes mellitus with diabetic chronic kidney disease: Secondary | ICD-10-CM | POA: Diagnosis not present

## 2023-10-13 DIAGNOSIS — I129 Hypertensive chronic kidney disease with stage 1 through stage 4 chronic kidney disease, or unspecified chronic kidney disease: Secondary | ICD-10-CM | POA: Diagnosis not present

## 2023-10-14 DIAGNOSIS — N1831 Chronic kidney disease, stage 3a: Secondary | ICD-10-CM | POA: Diagnosis not present

## 2023-10-14 DIAGNOSIS — K5792 Diverticulitis of intestine, part unspecified, without perforation or abscess without bleeding: Secondary | ICD-10-CM | POA: Diagnosis not present

## 2023-10-14 DIAGNOSIS — I129 Hypertensive chronic kidney disease with stage 1 through stage 4 chronic kidney disease, or unspecified chronic kidney disease: Secondary | ICD-10-CM | POA: Diagnosis not present

## 2023-10-14 DIAGNOSIS — K8050 Calculus of bile duct without cholangitis or cholecystitis without obstruction: Secondary | ICD-10-CM | POA: Diagnosis not present

## 2023-10-14 DIAGNOSIS — E1122 Type 2 diabetes mellitus with diabetic chronic kidney disease: Secondary | ICD-10-CM | POA: Diagnosis not present

## 2023-10-20 ENCOUNTER — Ambulatory Visit (INDEPENDENT_AMBULATORY_CARE_PROVIDER_SITE_OTHER): Payer: Medicare HMO | Admitting: Physician Assistant

## 2023-10-20 ENCOUNTER — Encounter: Payer: Self-pay | Admitting: Physician Assistant

## 2023-10-20 VITALS — BP 156/70 | HR 83

## 2023-10-20 DIAGNOSIS — E1140 Type 2 diabetes mellitus with diabetic neuropathy, unspecified: Secondary | ICD-10-CM | POA: Diagnosis not present

## 2023-10-20 DIAGNOSIS — E785 Hyperlipidemia, unspecified: Secondary | ICD-10-CM

## 2023-10-20 DIAGNOSIS — Z9049 Acquired absence of other specified parts of digestive tract: Secondary | ICD-10-CM | POA: Diagnosis not present

## 2023-10-20 DIAGNOSIS — Z794 Long term (current) use of insulin: Secondary | ICD-10-CM | POA: Diagnosis not present

## 2023-10-20 DIAGNOSIS — T8131XA Disruption of external operation (surgical) wound, not elsewhere classified, initial encounter: Secondary | ICD-10-CM | POA: Diagnosis not present

## 2023-10-20 MED ORDER — GLUCOSE BLOOD VI STRP: ORAL_STRIP | 12 refills | Status: DC

## 2023-10-20 NOTE — Patient Instructions (Signed)
 Wound Dehiscence Wound dehiscence is when a cut from surgery (incision) opens up and does not heal like it should. This problem usually happens 7-10 days after surgery. It is serious and should be treated right away. What are the causes? Stretching of the wound area by: Lifting. Vomiting. Coughing hard. Straining while pooping. Wound infection. Early removal of stitches (sutures) or the stitches breaking or coming loose. Not enough blood flow to the wound area. What increases the risk? Being very overweight (obese). Lung disease. Smoking. Not eating enough healthy foods (poor nutrition). Germs getting in the wound during surgery. Medical problems that make it harder to heal, such as diabetes. Taking certain medicines like steroids. What are the signs or symptoms?  Bleeding or leaking from the wound. Pain. Fever. Swelling along the cut from surgery. The wound breaking open. How is this treated? This condition may be treated by: Keeping the wound clean. Having surgery to fix the wound. Taking antibiotic medicine to treat or prevent infection. Taking medicines to lessen pain and swelling. Follow these instructions at home: Medicines Take over-the-counter and prescription medicines only as told by your doctor. If told by your doctor, take pain medicine 30 minutes before changing a bandage (dressing). This may help take away some of the pain. If you were prescribed antibiotics, take them as told by your doctor. Do not stop using them even if you start to feel better. Wound care  Clean the wound each day, or as told by your doctor. Wash the wound with mild soap and water 2 times a day, or as told. Rinse the wound with water to remove all soap. Pat the wound dry with a clean towel. Do not rub it. Follow instructions from your doctor about how to take care of your wound. Make sure you: Wash your hands with soap and water for at least 20 seconds before and after you change your  bandage. If you cannot use soap and water, use hand sanitizer. Change your bandage. Do not pick or scratch at the wound. Check your wound area every day for signs of infection. Check for: More redness, swelling, or pain. More fluid or blood. Warmth. Pus or a bad smell. Activity  Avoid exercises that make you sweat or could stretch your wound. Do not lift anything that is heavier than 10 lb (4.5 kg). General instructions Do not take baths, swim, or use a hot tub. Ask your doctor about taking showers or sponge baths. Contact a doctor if: Your wound does not seem to be healing right. You have a fever. You have signs of infection. Get help right away if: Your wound breaks open more. You have red streaks coming from your wound. You have a lot of bleeding coming from your wound. This information is not intended to replace advice given to you by your health care provider. Make sure you discuss any questions you have with your health care provider. Document Revised: 07/08/2022 Document Reviewed: 07/08/2022 Elsevier Patient Education  2024 [employer].

## 2023-10-20 NOTE — Progress Notes (Signed)
 Established Patient Office Visit  Subjective   Patient ID: [name] Rice, female    DOB: 13-Feb-1956  Age: 68 y.o. MRN: [id number]  Chief Complaint  Patient presents with   Medical Management of Chronic Issues    Pt had gallbladder removed pt c/o that stitch has come out and wound is oozing     HPI Pt is a 68 yo obese female with T2DM, HTN, HLD, chronic pain who presents to the clinic to follow up on cholecystectomy on 10/12/2023. She went to ED on 12/22 and CT showed acute cholelithasis and diverticulitis. She was given IV antibiotics and proceeded to surgery. She did well with surgery and has been able to advance diet. Denies any diarrhea or constipation. She has not followed up with surgeon at this time. She had a upper right abdomen stitch that came out and incision has opened a little and draining. No pain, fever, chills, body aches. Keeping clean with Hibiclens . Not discharged on any antibiotics.   .. Active Ambulatory Problems    Diagnosis Date Noted   Type II or unspecified type diabetes mellitus with neurological manifestations, not stated as uncontrolled(250.60) 08/08/2006   Hyperlipidemia LDL goal <70 08/08/2006   Depression with anxiety 01/28/2008   Hypertension associated with diabetes (HCC) 08/08/2006   Allergic sinusitis 05/27/2009   GERD 08/08/2006   Acute bilateral low back pain 11/27/2007   OSA on CPAP    Insomnia 02/06/2011   Preventative health care 08/16/2011   History of allergic rhinitis 09/07/2011   Iron deficiency anemia 03/29/2012   Lipoma of abdominal wall 05/01/2012   Intestinal bacterial overgrowth 09/20/2012   SOB (shortness of breath) on exertion 03/31/2013   Hepatic steatosis 09/26/2013   Diverticulitis 11/27/2013   Diabetic peripheral neuropathy (HCC) 05/30/2014   Gastroparesis due to secondary diabetes (HCC) 09/16/2014   Ear pain 12/09/2014   Type 2 diabetes, controlled, with peripheral neuropathy (HCC) 12/09/2014   Diarrhea 01/12/2015    Non-proliferative diabetic retinopathy, right eye (HCC) 11/23/2015   Chronic pain syndrome 12/15/2015   Morbid obesity due to excess calories (HCC) 02/16/2016   Unstable balance 02/16/2016   Intertrigo 03/15/2016   Left knee pain 03/15/2016   Post-menopausal 03/16/2016   Acute reaction to stress 03/28/2016   Dystonia 09/21/2016   Neck pain 07/11/2017   Lisfranc's dislocation, right, initial encounter 10/06/2017   Freiberg's disease, right 10/06/2017   Vasomotor rhinitis 11/28/2017   Ear itching 01/17/2018   No energy 01/17/2018   Hordeolum externum of right lower eyelid 02/16/2018   Hot flashes 02/16/2018   Restrictive lung disease 04/12/2018   Mixed hyperlipidemia 04/12/2018   Vitamin D  insufficiency 04/12/2018   Drug-induced constipation 05/23/2018   Calcification and ossification of muscle 07/13/2018   Pressure injury of right foot, unstageable (HCC) 09/25/2018   Bilateral carpal tunnel syndrome post carpal tunnel release on the left 01/24/2019   Type 2 diabetes mellitus with diabetic neuropathy, with long-term current use of insulin  (HCC) 06/20/2019   Dizziness 06/20/2019   Acute right ankle pain 09/25/2019   OAB (overactive bladder) 09/25/2019   Chronic kidney disease (CKD), stage III (moderate) (HCC) 10/04/2019   Trigger finger, right middle finger 12/24/2019   Trigger index finger of right hand 12/24/2019   DDD (degenerative disc disease), cervical 12/27/2019   Bilateral rotator cuff syndrome 01/24/2020   Paresthesia 06/30/2020   Weakness 06/30/2020   Chronic fatigue 06/30/2020   Moderate episode of recurrent major depressive disorder (HCC) 07/14/2020   Left ventricular hypertrophy 07/23/2020  Problems with swallowing 03/22/2021   Allergic conjunctivitis of both eyes 03/22/2021   Rosacea 04/02/2021   Chronic right shoulder pain 04/02/2021   Partial nontraumatic tear of left rotator cuff 08/16/2021   Osteopenia 12/30/2021   Blood clot in eye 12/31/2021   Diminished  pulses in lower extremity 04/12/2022   Bilateral leg pain 07/27/2022   Itching 07/27/2022   Clostridioides difficile infection 09/05/2022   Fistula of sigmoid colon 09/05/2022   Acute left-sided low back pain with left-sided sciatica 09/05/2022   Tubular adenoma of colon 09/16/2022   Cellulitis 01/06/2023   Fungal dermatitis 01/06/2023   OSA (obstructive sleep apnea) 05/15/2023   Chronic rhinitis 05/15/2023   Class 3 severe obesity due to excess calories with serious comorbidity and body mass index (BMI) of 40.0 to 44.9 in adult [facility]) 05/15/2023   Seasonal allergic rhinitis due to pollen 05/15/2023   Essential hypertension 05/15/2023   AKI (acute kidney injury) (HCC) 10/03/2023   Syncope and collapse 10/03/2023   Acute cystitis with hematuria 10/09/2023   Leukocytosis 10/09/2023   S/P cholecystectomy 10/20/2023   Rupture of operation wound 10/20/2023   Resolved Ambulatory Problems    Diagnosis Date Noted   IRON DEFICIENCY 01/30/2007   DEPRESSION 08/08/2006   TORTICOLLIS, SPASMODIC 02/13/2007   ALLERGIC RHINITIS 12/25/2006   ASTHMA 01/28/2008   COPD 08/08/2006   DIVERTICULOSIS, COLON 02/12/2003   DERMATITIS, SEBORRHEIC 01/30/2007   BPPV (benign paroxysmal positional vertigo), unspecified laterality 07/14/2009   GOUT, HX OF 08/08/2006   History of colonic polyps 09/16/2008   NEPHROLITHIASIS, HX OF 01/28/2008   Calf pain 11/29/2010   Tremor, essential 02/06/2011   Seborrheic dermatitis, unspecified 04/06/2011   Finger pain 04/06/2011   Shoulder pain, right 08/16/2011   Left leg pain 04/10/2012   Right ear pain 05/01/2012   Seborrheic dermatitis 05/01/2012   Hypoglycemia 05/01/2012   Vaginal candidiasis 08/22/2012   Sigmoid diverticulitis 09/15/2012   Acute-on-chronic kidney disease Stage II 09/15/2012   Cyst, breast 03/01/2013   Lactic acidosis 03/31/2013   Acute renal insufficiency 03/31/2013   Abdominal pain, left lower quadrant 04/29/2013   Gout 05/01/2013   COPD  (chronic obstructive pulmonary disease) with chronic bronchitis (HCC) 12/09/2014   Right shoulder pain 02/16/2016   Fall 03/16/2016   Foot fracture, right, closed, initial encounter 08/23/2017   Chronic pain of both shoulders 12/24/2019   Past Medical History:  Diagnosis Date   Allergy    Anxiety    Arthritis    Asthma    Carpal tunnel syndrome, bilateral    Cataract 2020   Cervical dystonia    COPD (chronic obstructive pulmonary disease) (HCC)    Depression    Diabetes mellitus    Diverticulosis    Gastroparesis    GERD (gastroesophageal reflux disease)    History of kidney stones    History of seborrheic dermatitis    HLD (hyperlipidemia)    Hypertension    Intussusception (HCC)    Lisfranc's sprain, left, initial encounter 10/06/2017   Lumbar pain 2011   Morbid obesity (HCC)    Nephrolithiasis    Osteoporosis 2018   Pneumonia    Recurrent boils    Sleep apnea    Torn rotator cuff RT SHOULDER   Tremor CCENTRAL NERVOUS SYSTEM     ROS See HPI.    Objective:     BP 136/70   Pulse 83   LMP 10/18/1995   SpO2 99%  BP Readings from Last 3 Encounters:  10/20/23 136/70  10/03/23 [facility])  124/44  08/09/23 (!) 117/59   Wt Readings from Last 3 Encounters:  10/03/23 251 lb 11.2 oz (114.2 kg)  08/09/23 250 lb (113.4 kg)  01/06/23 260 lb (117.9 kg)      Physical Exam Constitutional:      Appearance: Normal appearance.  HENT:     Head: Normocephalic.  Cardiovascular:     Rate and Rhythm: Normal rate.  Pulmonary:     Effort: Pulmonary effort is normal.     Breath sounds: Normal breath sounds.  Abdominal:     General: Bowel sounds are normal. There is no distension.     Palpations: Abdomen is soft.     Tenderness: There is no abdominal tenderness. There is no right CVA tenderness, left CVA tenderness, guarding or rebound.     Comments: 5 1cm wound incisions from laparoscopic surgery Right upper quadrant stitch absent and incision not closed No erythema and  light yellow drainage with good granulation tissue seen in the incision.   Neurological:     General: No focal deficit present.     Mental Status: She is alert and oriented to person, place, and time.  Psychiatric:        Mood and Affect: Mood normal.         The 10-year ASCVD risk score (Arnett DK, et al., 2019) is: 23%    Assessment & Plan:  [name] Rice was seen today for medical management of chronic issues.  Diagnoses and all orders for this visit:  S/P cholecystectomy  Type 2 diabetes mellitus with diabetic neuropathy, with long-term current use of insulin  (HCC) -     Cancel: POCT HgB A1C -     glucose blood test strip; Use with glucometer up to 4 times a day as needed to check blood glucose.  Hyperlipidemia LDL goal <70  Dehiscence of operative wound, initial encounter   Pt is doing well Some wound dehiscence noted and was cleaned well with hibclens and placed steri strips for closure Keep follow up with surgeon No worrisome signs of infection, discussed signs and when to contact us  or surgeon sooner  Too soon for A1c Accu check test strips ordered for patient    Vermell Bologna, PA-C

## 2023-10-24 ENCOUNTER — Encounter: Payer: Self-pay | Admitting: Physician Assistant

## 2023-11-01 ENCOUNTER — Other Ambulatory Visit: Payer: Self-pay | Admitting: Physician Assistant

## 2023-11-01 MED ORDER — ACCU-CHEK GUIDE CONTROL VI LIQD: 1.0000 | 3 refills | Status: DC

## 2023-11-06 ENCOUNTER — Ambulatory Visit: Payer: Self-pay | Admitting: Physician Assistant

## 2023-11-06 NOTE — Telephone Encounter (Signed)
Copied from CRM [id number]. Topic: Clinical - Red Word Triage >> Nov 06, 2023 11:22 AM Geroge Baseman wrote: Reason for Triage: Stitches on leg oozing and itchy, painful, not warm to the touch, stitches seem to he falling out again. Would like advice on what to do. Office has already fixed this issue once and now it is happening again.   Chief Complaint: Surgical wound problem Symptoms: Surgical wound open with some drainage  Frequency: Constant  Pertinent Negatives: Patient denies fevers Disposition: [] ED /[] Urgent Care (no appt availability in office) / [x] Appointment(In office/virtual)/ []  [facility]/ [] Home Care/ [] Refused Recommended Disposition /[] [facility]/ []  Follow-up with PCP Additional Notes: Patient reports she was seen on 10/20/23 due to the sutures on her cholecystectomy incisions coming out. She states that the area was cleaned and re-sutured, but that the area is open again. She states that there is slight bloody discharge from the left and some white/yellow discharge from the right. Patient states that she was instructed to call back if this happened again. Patient denies any fevers. Appointment made for tomorrow to have the incisions evaluated and treated. Patient instructed to call back for new or worsening symptoms. Patient understood and agreeable with this plan.     Reason for Disposition  [1] Incision gaping open AND [2] > 48 hours since wound re-opened AND [3] length of opening > 1/2 inch (12 mm)  Answer Assessment - Initial Assessment Questions 1. SYMPTOM: "What's the main symptom you're concerned about?" (e.g., drainage, incision opening up, pain, redness)     Wound oozing and itchy  2. ONSET: "When did oozing start?"     10/20/23 3. SURGERY: "What surgery did you have?"     Cholecystectomy  4. DATE of SURGERY: "When was the surgery?"      10/12/23 5. INCISION SITE: "Where is the incision located?"      Two incisions, below low belly button to left  and right 6. REDNESS: "Is there any redness at the incision site?" If Yes, ask: "How wide across is the redness?" (Inches, centimeters)      Approximately 1 inch  7. PAIN: "Is there any pain?" If Yes, ask: "How bad is it?"  (Scale 1-10; or mild, moderate, severe)   - NONE (0): no pain   - MILD (1-3): doesn't interfere with normal activities    - MODERATE (4-7): interferes with normal activities or awakens from sleep    - SEVERE (8-10): excruciating pain, unable to do any normal activities     Mild 8. BLEEDING: "Is there any bleeding?" If Yes, ask: "How much?" and "Where?"     Yes from left, "looks bloody, like a glob of blood" 9. DRAINAGE: "Is there any drainage from the incision site?" If Yes, ask: "What color and how much?" (e.g., red, cloudy, pus; drops, teaspoon)     Yellow/white drainage from right incision  10. FEVER: "Do you have a fever?" If Yes, ask: "What is your temperature, how was it measured, and when did it start?"       No 11. OTHER SYMPTOMS: "Do you have any other symptoms?" (e.g., dizziness, rash elsewhere on body, shaking chills, weakness)       Chills, possibly due to weather  Protocols used: Post-Op Incision Symptoms and Questions-A-AH

## 2023-11-07 ENCOUNTER — Ambulatory Visit (INDEPENDENT_AMBULATORY_CARE_PROVIDER_SITE_OTHER): Payer: Medicare HMO | Admitting: Physician Assistant

## 2023-11-07 ENCOUNTER — Telehealth: Payer: Self-pay

## 2023-11-07 VITALS — BP 161/77 | HR 107

## 2023-11-07 DIAGNOSIS — L03311 Cellulitis of abdominal wall: Secondary | ICD-10-CM | POA: Diagnosis not present

## 2023-11-07 DIAGNOSIS — T8131XD Disruption of external operation (surgical) wound, not elsewhere classified, subsequent encounter: Secondary | ICD-10-CM

## 2023-11-07 DIAGNOSIS — L304 Erythema intertrigo: Secondary | ICD-10-CM | POA: Diagnosis not present

## 2023-11-07 DIAGNOSIS — Z9049 Acquired absence of other specified parts of digestive tract: Secondary | ICD-10-CM | POA: Diagnosis not present

## 2023-11-07 MED ORDER — ACCU-CHEK GUIDE ME W/DEVICE KIT: 1.0000 | PACK | 0 refills | Status: DC

## 2023-11-07 MED ORDER — NYSTATIN-TRIAMCINOLONE 100000-0.1 UNIT/GM-% EX OINT: 1.0000 | TOPICAL_OINTMENT | Freq: Two times a day (BID) | CUTANEOUS | 0 refills | Status: AC

## 2023-11-07 MED ORDER — DOXYCYCLINE HYCLATE 100 MG PO TABS: 100.0000 mg | ORAL_TABLET | Freq: Two times a day (BID) | ORAL | 0 refills | Status: DC

## 2023-11-07 NOTE — Patient Instructions (Addendum)
Start doxycycline Nystatin cream for around belly button to treat yeast

## 2023-11-07 NOTE — Progress Notes (Unsigned)
Acute Office Visit  Subjective:     Patient ID: [name] Rice, female    DOB: May 11, 1956, 68 y.o.   MRN: [id number]  Chief Complaint  Patient presents with   Wound Check    Drainage    HPI  Patient is a 68 yo obese female with T2DM, HTN, HLD, chronic pain in today for a wound check. She is s/p cholecystectomy on 10/12/2023 and she was previously seen in the office on 10/20/2023 with drainage from her surgical wounds. Steri strips were placed over wounds but came off and the wound seemed to open up more.  She is still complaining of yellowish drainage from the wound on her right upper abdomen and the wound on her left upper quadrant. She also states there is a small wound in her umbilicus that is also forming. She states that she has been cleaning the wounds with Hibiclens and keeping the dressings clean and dry. She denies any fevers, chills or body aches.  Review of Systems  All other systems reviewed and are negative.  Active Ambulatory Problems    Diagnosis Date Noted   Type II or unspecified type diabetes mellitus with neurological manifestations, not stated as uncontrolled(250.60) 08/08/2006   Hyperlipidemia LDL goal <70 08/08/2006   Depression with anxiety 01/28/2008   Hypertension associated with diabetes (HCC) 08/08/2006   Allergic sinusitis 05/27/2009   GERD 08/08/2006   Acute bilateral low back pain 11/27/2007   OSA on CPAP    Insomnia 02/06/2011   Preventative health care 08/16/2011   History of allergic rhinitis 09/07/2011   Iron deficiency anemia 03/29/2012   Lipoma of abdominal wall 05/01/2012   Intestinal bacterial overgrowth 09/20/2012   SOB (shortness of breath) on exertion 03/31/2013   Hepatic steatosis 09/26/2013   Diverticulitis 11/27/2013   Diabetic peripheral neuropathy (HCC) 05/30/2014   Gastroparesis due to secondary diabetes (HCC) 09/16/2014   Ear pain 12/09/2014   Type 2 diabetes, controlled, with peripheral neuropathy (HCC) 12/09/2014   Diarrhea  01/12/2015   Non-proliferative diabetic retinopathy, right eye (HCC) 11/23/2015   Chronic pain syndrome 12/15/2015   Morbid obesity due to excess calories (HCC) 02/16/2016   Unstable balance 02/16/2016   Intertrigo 03/15/2016   Left knee pain 03/15/2016   Post-menopausal 03/16/2016   Acute reaction to stress 03/28/2016   Dystonia 09/21/2016   Neck pain 07/11/2017   Lisfranc's dislocation, right, initial encounter 10/06/2017   Freiberg's disease, right 10/06/2017   Vasomotor rhinitis 11/28/2017   Ear itching 01/17/2018   No energy 01/17/2018   Hordeolum externum of right lower eyelid 02/16/2018   Hot flashes 02/16/2018   Restrictive lung disease 04/12/2018   Mixed hyperlipidemia 04/12/2018   Vitamin D insufficiency 04/12/2018   Drug-induced constipation 05/23/2018   Calcification and ossification of muscle 07/13/2018   Pressure injury of right foot, unstageable (HCC) 09/25/2018   Bilateral carpal tunnel syndrome post carpal tunnel release on the left 01/24/2019   Type 2 diabetes mellitus with diabetic neuropathy, with long-term current use of insulin (HCC) 06/20/2019   Dizziness 06/20/2019   Acute right ankle pain 09/25/2019   OAB (overactive bladder) 09/25/2019   Chronic kidney disease (CKD), stage III (moderate) (HCC) 10/04/2019   Trigger finger, right middle finger 12/24/2019   Trigger index finger of right hand 12/24/2019   DDD (degenerative disc disease), cervical 12/27/2019   Bilateral rotator cuff syndrome 01/24/2020   Paresthesia 06/30/2020   Weakness 06/30/2020   Chronic fatigue 06/30/2020   Moderate episode of recurrent major depressive disorder (  HCC) 07/14/2020   Left ventricular hypertrophy 07/23/2020   Problems with swallowing 03/22/2021   Allergic conjunctivitis of both eyes 03/22/2021   Rosacea 04/02/2021   Chronic right shoulder pain 04/02/2021   Partial nontraumatic tear of left rotator cuff 08/16/2021   Osteopenia 12/30/2021   Blood clot in eye 12/31/2021    Diminished pulses in lower extremity 04/12/2022   Bilateral leg pain 07/27/2022   Itching 07/27/2022   Clostridioides difficile infection 09/05/2022   Fistula of sigmoid colon 09/05/2022   Acute left-sided low back pain with left-sided sciatica 09/05/2022   Tubular adenoma of colon 09/16/2022   Cellulitis 01/06/2023   Fungal dermatitis 01/06/2023   OSA (obstructive sleep apnea) 05/15/2023   Chronic rhinitis 05/15/2023   Class 3 severe obesity due to excess calories with serious comorbidity and body mass index (BMI) of 40.0 to 44.9 in adult (HCC) 05/15/2023   Seasonal allergic rhinitis due to pollen 05/15/2023   Essential hypertension 05/15/2023   AKI (acute kidney injury) (HCC) 10/03/2023   Syncope and collapse 10/03/2023   Acute cystitis with hematuria 10/09/2023   Leukocytosis 10/09/2023   S/P cholecystectomy 10/20/2023   Rupture of operation wound 10/20/2023   Resolved Ambulatory Problems    Diagnosis Date Noted   IRON DEFICIENCY 01/30/2007   DEPRESSION 08/08/2006   TORTICOLLIS, SPASMODIC 02/13/2007   ALLERGIC RHINITIS 12/25/2006   ASTHMA 01/28/2008   COPD 08/08/2006   DIVERTICULOSIS, COLON 02/12/2003   DERMATITIS, SEBORRHEIC 01/30/2007   BPPV (benign paroxysmal positional vertigo), unspecified laterality 07/14/2009   GOUT, HX OF 08/08/2006   History of colonic polyps 09/16/2008   NEPHROLITHIASIS, HX OF 01/28/2008   Calf pain 11/29/2010   Tremor, essential 02/06/2011   Seborrheic dermatitis, unspecified 04/06/2011   Finger pain 04/06/2011   Shoulder pain, right 08/16/2011   Left leg pain 04/10/2012   Right ear pain 05/01/2012   Seborrheic dermatitis 05/01/2012   Hypoglycemia 05/01/2012   Vaginal candidiasis 08/22/2012   Sigmoid diverticulitis 09/15/2012   Acute-on-chronic kidney disease Stage II 09/15/2012   Cyst, breast 03/01/2013   Lactic acidosis 03/31/2013   Acute renal insufficiency 03/31/2013   Abdominal pain, left lower quadrant 04/29/2013   Gout  05/01/2013   COPD (chronic obstructive pulmonary disease) with chronic bronchitis (HCC) 12/09/2014   Right shoulder pain 02/16/2016   Fall 03/16/2016   Foot fracture, right, closed, initial encounter 08/23/2017   Chronic pain of both shoulders 12/24/2019   Past Medical History:  Diagnosis Date   Allergy    Anxiety    Arthritis    Asthma    Carpal tunnel syndrome, bilateral    Cataract 2020   Cervical dystonia    COPD (chronic obstructive pulmonary disease) (HCC)    Depression    Diabetes mellitus    Diverticulosis    Gastroparesis    GERD (gastroesophageal reflux disease)    History of kidney stones    History of seborrheic dermatitis    HLD (hyperlipidemia)    Hypertension    Intussusception (HCC)    Lisfranc's sprain, left, initial encounter 10/06/2017   Lumbar pain 2011   Morbid obesity (HCC)    Nephrolithiasis    Osteoporosis 2018   Pneumonia    Recurrent boils    Sleep apnea    Torn rotator cuff RT SHOULDER   Tremor CCENTRAL NERVOUS SYSTEM       Objective:    BP (!) 161/77   Pulse (!) 107   LMP 10/18/1995   SpO2 99%   BP Readings from Last  3 Encounters:  11/07/23 (!) 161/77  10/20/23 136/70  10/03/23 (!) 124/44   Wt Readings from Last 3 Encounters:  10/03/23 251 lb 11.2 oz (114.2 kg)  08/09/23 250 lb (113.4 kg)  01/06/23 260 lb (117.9 kg)     Physical Exam Constitutional:      Appearance: Normal appearance. She is obese.  HENT:     Head: Normocephalic.  Cardiovascular:     Rate and Rhythm: Normal rate and regular rhythm.     Pulses: Normal pulses.     Heart sounds: Normal heart sounds.  Pulmonary:     Effort: Pulmonary effort is normal.     Breath sounds: Normal breath sounds.  Skin:    General: Skin is warm and dry.     Findings: Erythema, lesion and rash present.     Comments: 3cm x 1.5 cm open, purulent wound on upper right abdomen about 1 cm in depth at the greatest point; 0.25 cm in depth around outer edge 1cm x 1 cm open, purulent  wound on upper left abdomen 0.5cm x 0.5cm lesion in the umbilicus at the 6 o'clock position Satellite lesions around the umbilicus  Neurological:     General: No focal deficit present.     Mental Status: She is alert and oriented to person, place, and time.  Psychiatric:        Mood and Affect: Mood normal.        Behavior: Behavior normal.        Assessment & Plan:   Ivannia was seen today for wound check.  Diagnoses and all orders for this visit:  Dehiscence of operative wound, subsequent encounter -     doxycycline (VIBRA-TABS) 100 MG tablet; Take 1 tablet (100 mg total) by mouth 2 (two) times daily.  S/P cholecystectomy  Cellulitis of abdominal wall -     doxycycline (VIBRA-TABS) 100 MG tablet; Take 1 tablet (100 mg total) by mouth 2 (two) times daily.  Intertrigo -     nystatin-triamcinolone ointment (MYCOLOG); Apply 1 Application topically 2 (two) times daily.   Hibiclens wound cleanse in office [facility] wound debridement Iodoform packing and wound dressing  Doxycyline for wound infection  Keep wound dressing clean and dry until next office check [facility] Nystatin cream for intertrigo of the umbilicus  Appointment with surgeon on 11/20/2023  Return in about 3 days (around 11/10/2023) for wound change.  Tandy Gaw, PA-C

## 2023-11-08 ENCOUNTER — Encounter: Payer: Self-pay | Admitting: Physician Assistant

## 2023-11-09 NOTE — Telephone Encounter (Signed)
Med refill

## 2023-11-10 ENCOUNTER — Ambulatory Visit: Payer: Medicare HMO | Admitting: Physician Assistant

## 2023-11-10 ENCOUNTER — Ambulatory Visit (INDEPENDENT_AMBULATORY_CARE_PROVIDER_SITE_OTHER): Payer: Medicare HMO | Admitting: Physician Assistant

## 2023-11-10 VITALS — BP 151/131 | HR 77 | Ht 63.0 in

## 2023-11-10 DIAGNOSIS — Z794 Long term (current) use of insulin: Secondary | ICD-10-CM

## 2023-11-10 DIAGNOSIS — L03311 Cellulitis of abdominal wall: Secondary | ICD-10-CM | POA: Diagnosis not present

## 2023-11-10 DIAGNOSIS — L304 Erythema intertrigo: Secondary | ICD-10-CM

## 2023-11-10 DIAGNOSIS — E1140 Type 2 diabetes mellitus with diabetic neuropathy, unspecified: Secondary | ICD-10-CM | POA: Diagnosis not present

## 2023-11-10 DIAGNOSIS — T148XXA Other injury of unspecified body region, initial encounter: Secondary | ICD-10-CM | POA: Diagnosis not present

## 2023-11-10 DIAGNOSIS — Z9049 Acquired absence of other specified parts of digestive tract: Secondary | ICD-10-CM

## 2023-11-10 DIAGNOSIS — T8131XD Disruption of external operation (surgical) wound, not elsewhere classified, subsequent encounter: Secondary | ICD-10-CM | POA: Diagnosis not present

## 2023-11-10 MED ORDER — ACCU-CHEK GUIDE ME W/DEVICE KIT: PACK | 0 refills | Status: DC

## 2023-11-10 NOTE — Progress Notes (Unsigned)
Acute Office Visit  Subjective:     Patient ID: [name] Rice, female    DOB: [birth year], 68 y.o.   MRN: [id number]  Chief Complaint  Patient presents with   Wound Infection    Wound check     HPI Patient is in today for a repeat wound check. Pt was last seen in the office on 11/07/2023 for dehiscence of two surgical incision sites post laparoscopic cholecystectomy on 10/07/2024. Pt still taking oral doxycycline and using topical nystatin for intertrigo of the umbilicus. Pt continues to deny fever/malaise, chills, SOB, CP or palpitations.   Review of Systems  All other systems reviewed and are negative.  Active Ambulatory Problems    Diagnosis Date Noted   Type II or unspecified type diabetes mellitus with neurological manifestations, not stated as uncontrolled(250.60) 08/08/2006   Hyperlipidemia LDL goal <70 08/08/2006   Depression with anxiety 01/28/2008   Hypertension associated with diabetes (HCC) 08/08/2006   Allergic sinusitis 05/27/2009   GERD 08/08/2006   Acute bilateral low back pain 11/27/2007   OSA on CPAP    Insomnia 02/06/2011   Preventative health care 08/16/2011   History of allergic rhinitis 09/07/2011   Iron deficiency anemia 03/29/2012   Lipoma of abdominal wall 05/01/2012   Intestinal bacterial overgrowth 09/20/2012   SOB (shortness of breath) on exertion 03/31/2013   Hepatic steatosis 09/26/2013   Diverticulitis 11/27/2013   Diabetic peripheral neuropathy (HCC) 05/30/2014   Gastroparesis due to secondary diabetes (HCC) 09/16/2014   Ear pain 12/09/2014   Type 2 diabetes, controlled, with peripheral neuropathy (HCC) 12/09/2014   Diarrhea 01/12/2015   Non-proliferative diabetic retinopathy, right eye (HCC) 11/23/2015   Chronic pain syndrome 12/15/2015   Morbid obesity due to excess calories (HCC) 02/16/2016   Unstable balance 02/16/2016   Intertrigo 03/15/2016   Left knee pain 03/15/2016   Post-menopausal 03/16/2016   Acute reaction to stress  03/28/2016   Dystonia 09/21/2016   Neck pain 07/11/2017   Lisfranc's dislocation, right, initial encounter 10/06/2017   Freiberg's disease, right 10/06/2017   Vasomotor rhinitis 11/28/2017   Ear itching 01/17/2018   No energy 01/17/2018   Hordeolum externum of right lower eyelid 02/16/2018   Hot flashes 02/16/2018   Restrictive lung disease 04/12/2018   Mixed hyperlipidemia 04/12/2018   Vitamin D insufficiency 04/12/2018   Drug-induced constipation 05/23/2018   Calcification and ossification of muscle 07/13/2018   Pressure injury of right foot, unstageable (HCC) 09/25/2018   Bilateral carpal tunnel syndrome post carpal tunnel release on the left 01/24/2019   Type 2 diabetes mellitus with diabetic neuropathy, with long-term current use of insulin (HCC) 06/20/2019   Dizziness 06/20/2019   Acute right ankle pain 09/25/2019   OAB (overactive bladder) 09/25/2019   Chronic kidney disease (CKD), stage III (moderate) (HCC) 10/04/2019   Trigger finger, right middle finger 12/24/2019   Trigger index finger of right hand 12/24/2019   DDD (degenerative disc disease), cervical 12/27/2019   Bilateral rotator cuff syndrome 01/24/2020   Paresthesia 06/30/2020   Weakness 06/30/2020   Chronic fatigue 06/30/2020   Moderate episode of recurrent major depressive disorder (HCC) 07/14/2020   Left ventricular hypertrophy 07/23/2020   Problems with swallowing 03/22/2021   Allergic conjunctivitis of both eyes 03/22/2021   Rosacea 04/02/2021   Chronic right shoulder pain 04/02/2021   Partial nontraumatic tear of left rotator cuff 08/16/2021   Osteopenia 12/30/2021   Blood clot in eye 12/31/2021   Diminished pulses in lower extremity 04/12/2022   Bilateral leg pain  07/27/2022   Itching 07/27/2022   Clostridioides difficile infection 09/05/2022   Fistula of sigmoid colon 09/05/2022   Acute left-sided low back pain with left-sided sciatica 09/05/2022   Tubular adenoma of colon 09/16/2022   Cellulitis  01/06/2023   Fungal dermatitis 01/06/2023   OSA (obstructive sleep apnea) 05/15/2023   Chronic rhinitis 05/15/2023   Class 3 severe obesity due to excess calories with serious comorbidity and body mass index (BMI) of 40.0 to 44.9 in adult [facility]) 05/15/2023   Seasonal allergic rhinitis due to pollen 05/15/2023   Essential hypertension 05/15/2023   AKI (acute kidney injury) (HCC) 10/03/2023   Syncope and collapse 10/03/2023   Acute cystitis with hematuria 10/09/2023   Leukocytosis 10/09/2023   S/P cholecystectomy 10/20/2023   Rupture of operation wound 10/20/2023   Resolved Ambulatory Problems    Diagnosis Date Noted   IRON DEFICIENCY 01/30/2007   DEPRESSION 08/08/2006   TORTICOLLIS, SPASMODIC 02/13/2007   ALLERGIC RHINITIS 12/25/2006   ASTHMA 01/28/2008   COPD 08/08/2006   DIVERTICULOSIS, COLON 02/12/2003   DERMATITIS, SEBORRHEIC 01/30/2007   BPPV (benign paroxysmal positional vertigo), unspecified laterality 07/14/2009   GOUT, HX OF 08/08/2006   History of colonic polyps 09/16/2008   NEPHROLITHIASIS, HX OF 01/28/2008   Calf pain 11/29/2010   Tremor, essential 02/06/2011   Seborrheic dermatitis, unspecified 04/06/2011   Finger pain 04/06/2011   Shoulder pain, right 08/16/2011   Left leg pain 04/10/2012   Right ear pain 05/01/2012   Seborrheic dermatitis 05/01/2012   Hypoglycemia 05/01/2012   Vaginal candidiasis 08/22/2012   Sigmoid diverticulitis 09/15/2012   Acute-on-chronic kidney disease Stage II 09/15/2012   Cyst, breast 03/01/2013   Lactic acidosis 03/31/2013   Acute renal insufficiency 03/31/2013   Abdominal pain, left lower quadrant 04/29/2013   Gout 05/01/2013   COPD (chronic obstructive pulmonary disease) with chronic bronchitis (HCC) 12/09/2014   Right shoulder pain 02/16/2016   Fall 03/16/2016   Foot fracture, right, closed, initial encounter 08/23/2017   Chronic pain of both shoulders 12/24/2019   Past Medical History:  Diagnosis Date   Allergy     Anxiety    Arthritis    Asthma    Carpal tunnel syndrome, bilateral    Cataract 2020   Cervical dystonia    COPD (chronic obstructive pulmonary disease) (HCC)    Depression    Diabetes mellitus    Diverticulosis    Gastroparesis    GERD (gastroesophageal reflux disease)    History of kidney stones    History of seborrheic dermatitis    HLD (hyperlipidemia)    Hypertension    Intussusception (HCC)    Lisfranc's sprain, left, initial encounter 10/06/2017   Lumbar pain 2011   Morbid obesity (HCC)    Nephrolithiasis    Osteoporosis 2018   Pneumonia    Recurrent boils    Sleep apnea    Torn rotator cuff RT SHOULDER   Tremor CCENTRAL NERVOUS SYSTEM       Objective:    BP (!) 151/88   Pulse 77   Ht 5\' 3"  (1.6 m)   LMP 10/18/1995   SpO2 99%   BMI 44.59 kg/m   BP Readings from Last 3 Encounters:  11/10/23 (!) 151/88  11/07/23 (!) 161/77  10/20/23 136/70   Wt Readings from Last 3 Encounters:  10/03/23 251 lb 11.2 oz (114.2 kg)  08/09/23 250 lb (113.4 kg)  01/06/23 260 lb (117.9 kg)    Physical Exam Constitutional:      Appearance: Normal appearance. She  is obese.  HENT:     Head: Normocephalic.  Cardiovascular:     Rate and Rhythm: Normal rate and regular rhythm.     Pulses: Normal pulses.     Heart sounds: Normal heart sounds.  Pulmonary:     Effort: Pulmonary effort is normal.     Breath sounds: Normal breath sounds.  Skin:    Findings: Erythema and lesion present.     Comments: 3cm x 1.5 cm open, purulent wound on upper right abdomen about 0.5 cm in depth at the greatest point 1cm x 1 cm open, purulent wound on upper left abdomen Intertrigo of the umbilicus is resolving   Neurological:     General: No focal deficit present.     Mental Status: She is alert and oriented to person, place, and time.  Psychiatric:        Mood and Affect: Mood normal.        Behavior: Behavior normal.    Results for orders placed or performed in visit on 11/10/23   Wound culture   Specimen: Abdomen; Wound   Wound  Result Value Ref Range   Gram Stain Result Final report    Organism ID, Bacteria Comment    Organism ID, Bacteria No organisms seen    Aerobic Bacterial Culture Preliminary report (A)    Organism ID, Bacteria Staphylococcus aureus (A)        Assessment & Plan:   [name] Rice was seen today for wound infection.  Diagnoses and all orders for this visit:  Wound discharge -     Wound culture  S/P cholecystectomy  Dehiscence of operative wound, subsequent encounter  Cellulitis of abdominal wall  Intertrigo  Type 2 diabetes mellitus with diabetic neuropathy, with long-term current use of insulin (HCC) -     Blood Glucose Monitoring Suppl (ACCU-CHEK GUIDE ME) w/Device KIT; Sig: 1 each by Other route in the morning, at noon, in the evening, and at bedtime. [employer] True Meter Air Meter Use up to four times daily as directed. (FOR ICD-10 E10.9, E11.9).  Glucose monitor sent to pharmacy  Wound check, Hibiclens cleaning and repacking of wound done in office Intertrigo resolving  Wound appears to be healing slowly Wound cultures obtained and sent  Look for signs of systemic infection such as fever, chills, tachycardia, malaise  Change wound dressing at home on 11/13/2023 Return back to the office on 11/15/2023 for wound check  Tandy Gaw, PA-C

## 2023-11-13 ENCOUNTER — Encounter: Payer: Self-pay | Admitting: Physician Assistant

## 2023-11-13 LAB — WOUND CULTURE: Organism ID, Bacteria: NONE SEEN

## 2023-11-13 NOTE — Progress Notes (Signed)
Staph aureus bacteria found. Doxycycline should treat.

## 2023-11-15 ENCOUNTER — Ambulatory Visit: Payer: Medicare HMO | Admitting: Physician Assistant

## 2023-11-27 ENCOUNTER — Other Ambulatory Visit: Payer: Self-pay | Admitting: Physician Assistant

## 2023-11-27 DIAGNOSIS — J310 Chronic rhinitis: Secondary | ICD-10-CM

## 2023-11-28 ENCOUNTER — Other Ambulatory Visit: Payer: Self-pay | Admitting: Physician Assistant

## 2023-11-28 DIAGNOSIS — G894 Chronic pain syndrome: Secondary | ICD-10-CM

## 2023-11-28 NOTE — Telephone Encounter (Signed)
Last Fill: 09/11/23  Last OV: 11/10/23 Next OV: 12/05/23 AWV  Routing to provider for review/authorization.

## 2023-11-28 NOTE — Telephone Encounter (Unsigned)
Copied from CRM [id number]. Topic: Clinical - Medication Refill >> Nov 28, 2023 10:05 AM [name] Rice wrote: Most Recent Primary Care Visit:  Provider: Jomarie Longs  Department: [facility]  Visit Type: OFFICE VISIT  Date: 11/10/2023  Medication: HYDROcodone-acetaminophen (NORCO) 10-325 MG tablet  Has the patient contacted their pharmacy? No (Agent: If no, request that the patient contact the pharmacy for the refill. If patient does not wish to contact the pharmacy document the reason why and proceed with request.) (Agent: If yes, when and what did the pharmacy advise?)  Is this the correct pharmacy for this prescription? Yes If no, delete pharmacy and type the correct one.  This is the patient's preferred pharmacy:  [id number] - [place], [place] - [address] [facility] [address] [place] [address] Phone: [phone] Fax: [phone]  [id number] - [place], [place] - [address] [place] [address] Phone: [phone] Fax: [phone]  [facility] - [place], [place] - [address] [place] [address] Phone: [phone] Fax: [phone]   Has the prescription been filled recently? No  Is the patient out of the medication? Yes  Has the patient been seen for an appointment in the last year OR does the patient have an upcoming appointment? Yes  Can we respond through MyChart? Yes  Agent: Please be advised that Rx refills may take up to 3 business days. We ask that you follow-up with your pharmacy.

## 2023-11-29 MED ORDER — HYDROCODONE-ACETAMINOPHEN 10-325 MG PO TABS: 1.0000 | ORAL_TABLET | Freq: Two times a day (BID) | ORAL | 0 refills | Status: DC

## 2023-12-05 ENCOUNTER — Ambulatory Visit (INDEPENDENT_AMBULATORY_CARE_PROVIDER_SITE_OTHER): Payer: Medicare HMO

## 2023-12-05 VITALS — Ht 63.0 in | Wt 233.0 lb

## 2023-12-05 DIAGNOSIS — Z0000 Encounter for general adult medical examination without abnormal findings: Secondary | ICD-10-CM

## 2023-12-05 DIAGNOSIS — Z780 Asymptomatic menopausal state: Secondary | ICD-10-CM

## 2023-12-05 DIAGNOSIS — E1140 Type 2 diabetes mellitus with diabetic neuropathy, unspecified: Secondary | ICD-10-CM

## 2023-12-05 DIAGNOSIS — Z13820 Encounter for screening for osteoporosis: Secondary | ICD-10-CM

## 2023-12-05 DIAGNOSIS — E1343 Other specified diabetes mellitus with diabetic autonomic (poly)neuropathy: Secondary | ICD-10-CM

## 2023-12-05 MED ORDER — OZEMPIC (1 MG/DOSE) 4 MG/3ML ~~LOC~~ SOPN: PEN_INJECTOR | SUBCUTANEOUS | 2 refills | Status: DC

## 2023-12-05 MED ORDER — METOCLOPRAMIDE HCL 10 MG PO TABS: 5.0000 mg | ORAL_TABLET | Freq: Three times a day (TID) | ORAL | 1 refills | Status: AC

## 2023-12-05 NOTE — Patient Instructions (Signed)
  Ms. Harwick , Thank you for taking time to come for your Medicare Wellness Visit. I appreciate your ongoing commitment to your health goals. Please review the following plan we discussed and let me know if I can assist you in the future.   These are the goals we discussed:  Goals       Blood Pressure < 140/90      CCM:  Monitor and Maintain HbA1c <8%      She would like to work on her diabetes. She also would like to walk more.       HEMOGLOBIN A1C < 7.0      LDL CALC < 100      Patient Stated (pt-stated)      Patient would like to being able to with the walker again.        This is a list of the screening recommended for you and due dates:  Health Maintenance  Topic Date Due   Yearly kidney health urinalysis for diabetes  06/17/2020   COVID-19 Vaccine (4 - 2024-25 season) 06/18/2023   Hemoglobin A1C  11/09/2023   Eye exam for diabetics  11/25/2023   Flu Shot  01/15/2024*   Complete foot exam   08/08/2024   Yearly kidney function blood test for diabetes  10/02/2024   Medicare Annual Wellness Visit  12/04/2024   Mammogram  02/07/2025   Colon Cancer Screening  10/06/2029   DTaP/Tdap/Td vaccine (3 - Td or Tdap) 07/13/2032   Pneumonia Vaccine  Completed   DEXA scan (bone density measurement)  Completed   Hepatitis C Screening  Completed   Zoster (Shingles) Vaccine  Completed   HPV Vaccine  Aged Out  *Topic was postponed. The date shown is not the original due date.

## 2023-12-05 NOTE — Progress Notes (Addendum)
 Subjective:   [name] Rice is a 68 y.o. female who presents for Medicare Annual (Subsequent) preventive examination.  Visit Complete: Virtual I connected with  [name] Rice on 12/05/23 by a audio enabled telemedicine application and verified that I am speaking with the correct person using two identifiers.  Patient Location: Home  Provider Location: Office/Clinic  I discussed the limitations of evaluation and management by telemedicine. The patient expressed understanding and agreed to proceed.  Vital Signs: Because this visit was a virtual/telehealth visit, some criteria may be missing or patient reported. Any vitals not documented were not able to be obtained and vitals that have been documented are patient reported.  Patient Medicare AWV questionnaire was completed by the patient on n/a; I have confirmed that all information answered by patient is correct and no changes since this date.  Cardiac Risk Factors include: advanced age (>22men, >21 women);obesity (BMI >30kg/m2);diabetes mellitus;dyslipidemia;hypertension;smoking/ tobacco exposure;sedentary lifestyle     Objective:    Today's Vitals   12/05/23 0900  Weight: 233 lb (105.7 kg)  Height: 5\' 3"  (1.6 m)  PainSc: 6    Body mass index is 41.27 kg/m.     12/05/2023    9:37 AM 12/02/2022    9:35 AM 03/02/2020    1:11 PM 06/27/2019   11:51 AM 02/22/2016    2:42 PM 03/31/2013   10:35 PM 09/15/2012    6:23 AM  Advanced Directives  Does Patient Have a Medical Advance Directive? No Yes No No No Patient does not have advance directive Patient does not have advance directive;Patient would not like information  Type of Advance Directive  Living will       Does patient want to make changes to medical advance directive?  No - Patient declined       Would patient like information on creating a medical advance directive? No - Patient declined  No - Patient declined No - Patient declined No - patient declined information     Pre-existing out of facility DNR order (yellow form or pink MOST form)      No     Current Medications (verified) Outpatient Encounter Medications as of 12/05/2023  Medication Sig   Alcohol Swabs (B-D SINGLE USE SWABS REGULAR) PADS 1 Device by Does not apply route 4 (four) times daily as needed.   azelastine (OPTIVAR) 0.05 % ophthalmic solution INSTILL 1 DROP INTO BOTH EYES TWICE DAILY   Blood Glucose Calibration (ACCU-CHEK GUIDE CONTROL) LIQD 1 each by In Vitro route as directed.   blood glucose meter kit and supplies 1 each by Other route in the morning, at noon, in the evening, and at bedtime. [employer] True Meter Air Meter Use up to four times daily as directed. (FOR ICD-10 E10.9, E11.9).   Blood Glucose Monitoring Suppl (ACCU-CHEK GUIDE ME) w/Device KIT 1 each by Does not apply route as directed.   Blood Glucose Monitoring Suppl (ACCU-CHEK GUIDE ME) w/Device KIT Sig: 1 each by Other route in the morning, at noon, in the evening, and at bedtime. [employer] True Meter Air Meter Use up to four times daily as directed. (FOR ICD-10 E10.9, E11.9).   Cholecalciferol (VITAMIN D) 50 MCG (2000 UT) CAPS Take 2,000 Units by mouth daily.   dicyclomine (BENTYL) 20 MG tablet Take 1 tablet (20 mg total) by mouth 2 (two) times daily as needed for spasms.   DROPLET PEN NEEDLES 32G X 6 MM MISC USE THREE TIMES DAILY  WITH  NOVOLOG   DULoxetine (CYMBALTA) 60  MG capsule Take 2 capsules (120 mg total) by mouth daily.   famotidine (PEPCID) 20 MG tablet TAKE 1 TABLET BY MOUTH TWICE A DAY   fluticasone (FLONASE) 50 MCG/ACT nasal spray SHAKE LIQUID AND USE 2 SPRAYS IN EACH NOSTRIL DAILY   furosemide (LASIX) 20 MG tablet TAKE 1 TABLET TWICE DAILY   gabapentin (NEURONTIN) 800 MG tablet Take 1 tablet (800 mg total) by mouth 3 (three) times daily.   HYDROcodone-acetaminophen (NORCO) 10-325 MG tablet Take 1 tablet by mouth every 12 (twelve) hours.   hydrOXYzine (ATARAX) 50 MG tablet Take 1 tablet (50 mg total) by mouth every 8  (eight) hours as needed. For itching.   insulin glargine, 2 Unit Dial, (TOUJEO MAX SOLOSTAR) 300 UNIT/ML Solostar Pen Inject 40 Units into the skin at bedtime.   ipratropium (ATROVENT) 0.06 % nasal spray Place 2 sprays into both nostrils 4 (four) times daily.   levocetirizine (XYZAL) 5 MG tablet TAKE 1 TABLET BY MOUTH EVERY EVENING   losartan (COZAAR) 25 MG tablet TAKE 1 TABLET EVERY DAY   meclizine (ANTIVERT) 25 MG tablet Take 1 tablet (25 mg total) by mouth 3 (three) times daily as needed for dizziness.   metoCLOPramide (REGLAN) 10 MG tablet TAKE 1/2 TABLET(5 MG) BY MOUTH THREE TIMES DAILY BEFORE MEALS   metroNIDAZOLE (METROGEL) 1 % gel Apply topically daily.   mirabegron ER (MYRBETRIQ) 25 MG TB24 tablet TAKE 1 TABLET BY MOUTH DAILY   Misc. Devices [facility] CUSHION) MISC 1 Device by Does not apply route daily.   montelukast (SINGULAIR) 10 MG tablet TAKE 1 TABLET AT BEDTIME   nystatin ointment (MYCOSTATIN) APPLY TO AFFECTED AREA TWICE A DAY   nystatin-triamcinolone ointment (MYCOLOG) Apply 1 Application topically 2 (two) times daily.   pantoprazole (PROTONIX) 40 MG tablet TAKE 1 TABLET BY MOUTH DAILY   pioglitazone (ACTOS) 30 MG tablet TAKE 1 TABLET EVERY DAY   pravastatin (PRAVACHOL) 10 MG tablet Take 1 tablet (10 mg total) by mouth daily.   propranolol (INDERAL) 20 MG tablet TAKE 1TABLET BY MOUTH TWICE DAILY   Semaglutide, 1 MG/DOSE, (OZEMPIC, 1 MG/DOSE,) 4 MG/3ML SOPN INJECT 1MG  INTO THE SKIN ONCE A WEEK   vitamin B-12 (CYANOCOBALAMIN) 1000 MCG tablet Take 1 tablet (1,000 mcg total) by mouth daily.   [DISCONTINUED] doxycycline (VIBRA-TABS) 100 MG tablet Take 1 tablet (100 mg total) by mouth 2 (two) times daily.   [DISCONTINUED] glucose blood test strip Use with glucometer up to 4 times a day as needed to check blood glucose.   No facility-administered encounter medications on file as of 12/05/2023.    Allergies (verified) Ace inhibitors, Atrovent [ipratropium], Cefaclor, Glyxambi  [empagliflozin-linagliptin], Shrimp [shellfish allergy], Farxiga [dapagliflozin], Metformin and related, Wellbutrin [bupropion], Ceclor [cefaclor], Sulfa antibiotics, and Sulfonamide derivatives   History: Past Medical History:  Diagnosis Date   Allergy    Always allergic to cut grass   Anxiety    Arthritis    Asthma    no PFTs   Carpal tunnel syndrome, bilateral    Cataract 2020   Removed   Cervical dystonia    COPD (chronic obstructive pulmonary disease) (HCC)    no PFTs   Depression    Diabetes mellitus    Diarrhea    Diverticulitis    Diverticulosis    Dizziness    Gastroparesis    GERD (gastroesophageal reflux disease)    History of colonic polyps    hyperplastic   History of kidney stones    History of seborrheic dermatitis  face,ears   HLD (hyperlipidemia)    Hypertension    Intussusception (HCC)    Lisfranc's sprain, left, initial encounter 10/06/2017   Lumbar pain 2011   per x-ray - Multilevel spondylosis   Morbid obesity (HCC)    Nephrolithiasis    OSA on CPAP 2008   Osteoporosis 2018   Pneumonia    Recurrent boils    "SLUSTER BOILS" ON ABD AND LABIA   Sleep apnea    USES C-PAP   Torn rotator cuff RT SHOULDER   Tremor CCENTRAL NERVOUS SYSTEM   Past Surgical History:  Procedure Laterality Date   CARPAL TUNNEL RELEASE Left 2006   CHOLECYSTECTOMY  10/12/2023   ELBOW SURGERY Left    EYE SURGERY  2020   HAND SURGERY Right RIGHT   KNEE ARTHROSCOPY Left    LUMBAR LAMINECTOMY     MASS EXCISION  06/28/2012   Procedure: EXCISION MASS;  Surgeon: Shelly Rubenstein, MD;  Location: [facility] OR;  Service: General;  Laterality: Left;  excision left upper quadrant abdominal wall mass, Benign Lipoma   NASAL SINUS SURGERY     PILONIDAL CYST EXCISION  [id number]   SUBACROMIAL DECOMPRESSION Right    TUBAL LIGATION     Family History  Problem Relation Age of Onset   Diabetes Mother    Hyperlipidemia Mother    Hypertension Mother    Kidney disease Mother     Depression Sister    Diabetes Sister    Hyperlipidemia Sister    Hypertension Sister    Stroke Sister    Vision loss Sister    Heart disease Brother    Heart attack Brother    Depression Brother    Diabetes Brother    Hypertension Brother    Hyperlipidemia Brother    Heart attack Brother    Depression Brother    Diabetes Brother    Hyperlipidemia Brother    Hypertension Brother    Kidney cancer Brother    Cancer Neg Hx    Social History   Socioeconomic History   Marital status: Divorced    Spouse name: Not on file   Number of children: 1   Years of education: 12   Highest education level: Some college, no degree  Occupational History   Occupation: disabled  Tobacco Use   Smoking status: Former    Current packs/day: 0.00    Average packs/day: 0.5 packs/day for 30.0 years (15.0 ttl pk-yrs)    Types: Cigarettes    Start date: 10/17/1966    Quit date: 10/17/1996    Years since quitting: 27.1   Smokeless tobacco: Never  Vaping Use   Vaping status: Never Used  Substance and Sexual Activity   Alcohol use: No   Drug use: No   Sexual activity: Not Currently    Birth control/protection: Surgical  Other Topics Concern   Not on file  Social History Narrative   Lives with her daughter in [place]. She has one daughter. She enjoys jigsaw puzzles.   Social Drivers of Corporate investment banker Strain: Low Risk  (12/05/2023)   Overall Financial Resource Strain (CARDIA)    Difficulty of Paying Living Expenses: Not very hard  Recent Concern: Financial Resource Strain - Medium Risk (12/02/2023)   [facility]   Overall Financial Resource Strain (CARDIA)    Difficulty of Paying Living Expenses: Somewhat hard  Food Insecurity: No Food Insecurity (12/05/2023)   Hunger Vital Sign    Worried About Running Out of Food in the  Last Year: Never true    Ran Out of Food in the Last Year: Never true  Recent Concern: Food Insecurity - Food Insecurity Present (12/02/2023)    Received from [facility]   Hunger Vital Sign    Worried About Running Out of Food in the Last Year: Sometimes true    Ran Out of Food in the Last Year: Never true  Transportation Needs: No Transportation Needs (12/05/2023)   PRAPARE - Administrator, Civil Service (Medical): No    Lack of Transportation (Non-Medical): No  Physical Activity: Inactive (12/05/2023)   Exercise Vital Sign    Days of Exercise per Week: 0 days    Minutes of Exercise per Session: 0 min  Stress: Stress Concern Present (12/05/2023)   [employer] of Occupational Health - Occupational Stress Questionnaire    Feeling of Stress : Very much  Social Connections: Moderately Integrated (12/05/2023)   Social Connection and Isolation Panel [NHANES]    Frequency of Communication with Friends and Family: More than three times a week    Frequency of Social Gatherings with Friends and Family: More than three times a week    Attends Religious Services: More than 4 times per year    Active Member of [employer] or Organizations: Yes    Attends Engineer, structural: More than 4 times per year    Marital Status: Divorced  Recent Concern: Social Connections - Moderately Isolated (11/07/2023)   Social Connection and Isolation Panel [NHANES]    Frequency of Communication with Friends and Family: Once a week    Frequency of Social Gatherings with Friends and Family: More than three times a week    Attends Religious Services: 1 to 4 times per year    Active Member of [employer] or Organizations: No    Attends Engineer, structural: Not on file    Marital Status: Divorced    Tobacco Counseling Counseling given: Not Answered   Clinical Intake:  Pre-visit preparation completed: Yes  Pain : 0-10 Pain Score: 6  Pain Type: Chronic pain Pain Location: Back Pain Orientation: Lower Pain Descriptors / Indicators: Sharp, Radiating Pain Onset: More than a month ago Pain Frequency: Constant Pain Relieving  Factors: Hydrocodone Effect of Pain on Daily Activities: Wheel chair 6 years.  Pain Relieving Factors: Hydrocodone  BMI - recorded: 41.27 Nutritional Status: BMI > 30  Obese Nutritional Risks: None Diabetes: Yes CBG done?: Yes (294 mg/dl) CBG resulted in Enter/ Edit results?: No Did pt. bring in CBG monitor from home?: No  What is the last grade level you completed in school?: 12  Interpreter Needed?: No      Activities of Daily Living    12/05/2023    9:10 AM  In your present state of health, do you have any difficulty performing the following activities:  Hearing? 0  Vision? 1  Comment blood clot in right eye  Difficulty concentrating or making decisions? 1  Walking or climbing stairs? 1  Dressing or bathing? 1  Doing errands, shopping? 1  Preparing Food and eating ? N  Using the Toilet? N  Managing your Medications? N  Managing your Finances? Y  Housekeeping or managing your Housekeeping? Y  Comment Daughter helps with housekeeping.    Patient Care Team: Nolene Ebbs as PCP - General (Family Medicine) Plyler, Cecil Cranker, RD as Dietitian (Dietician) Ernesto Rutherford, MD as Consulting Physician (Ophthalmology) Jonah Blue, DO as Referring Physician (Internal Medicine)  Indicate any recent Medical  Services you may have received from other than [facility] providers in the past year (date may be approximate).     Assessment:   This is a routine wellness examination for [place].  Hearing/Vision screen Hearing Screening - Comments:: Unable to check Vision Screening - Comments:: Unable to check   Goals Addressed             This Visit's Progress    CCM:  Monitor and Maintain HbA1c <8%       She would like to work on her diabetes. She also would like to walk more.       Depression Screen    12/05/2023    9:29 AM 05/08/2023   11:27 AM 12/02/2022    9:41 AM 11/04/2022    9:54 AM 07/13/2022    9:01 AM 03/22/2021    3:56 PM 04/03/2020    9:01 AM  PHQ 2/9  Scores  PHQ - 2 Score 5 5 3 3 3 4 2   PHQ- 9 Score 12 15 12 14 15 19 16     Fall Risk    12/05/2023    9:38 AM 05/08/2023   11:42 AM 12/02/2022    9:40 AM 11/29/2022    9:36 AM 11/04/2022    9:53 AM  Fall Risk   Falls in the past year? 0 0 0 0 0  Number falls in past yr: 0 0 0 0 0  Injury with Fall? 0 0 0 0 0  Risk for fall due to : Impaired mobility;Impaired vision;Impaired balance/gait No Fall Risks No Fall Risks  No Fall Risks  Follow up Falls evaluation completed Falls evaluation completed Falls evaluation completed  Falls evaluation completed    MEDICARE RISK AT HOME: Medicare Risk at Home Any stairs in or around the home?: No If so, are there any without handrails?: No Home free of loose throw rugs in walkways, pet beds, electrical cords, etc?: Yes Adequate lighting in your home to reduce risk of falls?: Yes Life alert?: Yes Use of a cane, walker or w/c?: Yes Grab bars in the bathroom?: Yes Shower chair or bench in shower?: Yes Elevated toilet seat or a handicapped toilet?: Yes  TIMED UP AND GO:  Was the test performed?  No    Cognitive Function:        12/05/2023    9:39 AM 12/02/2022    9:44 AM  6CIT Screen  What Year? 0 points 0 points  What month? 0 points 0 points  What time? 0 points 0 points  Count back from 20 0 points 0 points  Months in reverse 0 points 0 points  Repeat phrase 2 points 0 points  Total Score 2 points 0 points    Immunizations Immunization History  Administered Date(s) Administered   Fluad Quad(high Dose 65+) 06/30/2020, 06/28/2021, 07/13/2022   Influenza Split 08/08/2011   Influenza Whole 07/21/2008, 08/06/2009, 09/07/2010   Influenza,inj,Quad PF,6+ Mos 08/21/2013, 06/17/2015, 06/21/2016, 07/11/2017, 07/13/2018, 06/18/2019   Influenza,inj,quad, With Preservative 08/21/2013, 06/17/2015, 06/21/2016, 07/11/2017, 07/13/2018, 06/18/2019   Influenza-Unspecified 06/19/2014   PFIZER(Purple Top)SARS-COV-2 Vaccination 12/15/2019, 01/14/2020    Pfizer(Comirnaty)Fall Seasonal Vaccine 12 years and older 11/04/2022   Pneumococcal Conjugate-13 06/17/2015   Pneumococcal Polysaccharide-23 02/11/2009, 06/18/2019   Tdap 04/04/2011, 07/13/2022   Zoster Recombinant(Shingrix) 03/23/2019, 08/01/2019   Zoster, Live 06/19/2014    TDAP status: Up to date  Flu Vaccine status: Due, Education has been provided regarding the importance of this vaccine. Advised may receive this vaccine at local pharmacy or Health Dept.  Aware to provide a copy of the vaccination record if obtained from local pharmacy or Health Dept. Verbalized acceptance and understanding. She reports she received the flu vaccine during a visit to the office.   Pneumococcal vaccine status: Due, Education has been provided regarding the importance of this vaccine. Advised may receive this vaccine at local pharmacy or Health Dept. Aware to provide a copy of the vaccination record if obtained from local pharmacy or Health Dept. Verbalized acceptance and understanding.  Covid-19 vaccine status: Information provided on how to obtain vaccines.   Qualifies for Shingles Vaccine? Yes   Zostavax completed Yes   Shingrix Completed?: Yes  Screening Tests Health Maintenance  Topic Date Due   Diabetic kidney evaluation - Urine ACR  06/17/2020   COVID-19 Vaccine (4 - 2024-25 season) 06/18/2023   HEMOGLOBIN A1C  11/09/2023   OPHTHALMOLOGY EXAM  11/25/2023   INFLUENZA VACCINE  01/15/2024 (Originally 05/18/2023)   FOOT EXAM  08/08/2024   Diabetic kidney evaluation - eGFR measurement  10/02/2024   Medicare Annual Wellness (AWV)  12/04/2024   MAMMOGRAM  02/07/2025   Colonoscopy  10/06/2029   DTaP/Tdap/Td (3 - Td or Tdap) 07/13/2032   Pneumonia Vaccine 48+ Years old  Completed   DEXA SCAN  Completed   Hepatitis C Screening  Completed   Zoster Vaccines- Shingrix  Completed   HPV VACCINES  Aged Out    Health Maintenance  Health Maintenance Due  Topic Date Due   Diabetic kidney evaluation  - Urine ACR  06/17/2020   COVID-19 Vaccine (4 - 2024-25 season) 06/18/2023   HEMOGLOBIN A1C  11/09/2023   OPHTHALMOLOGY EXAM  11/25/2023    Colorectal cancer screening: Type of screening: Colonoscopy. Completed 10/06/2022. Repeat every 7 years  Mammogram status: Completed 02/08/2023. Repeat every year  Bone Density status: Ordered 12/05/23. Pt provided with contact info and advised to call to schedule appt.  Lung Cancer Screening: (Low Dose CT Chest recommended if Age 35-80 years, 20 pack-year currently smoking OR have quit w/in 15years.) does qualify.   Lung Cancer Screening Referral: n/a  Additional Screening:  Hepatitis C Screening: does qualify; Completed 06/18/2019  Vision Screening: Recommended annual ophthalmology exams for early detection of glaucoma and other disorders of the eye. Is the patient up to date with their annual eye exam?  No  Who is the provider or what is the name of the office in which the patient attends annual eye exams? Dr Dione Booze If pt is not established with a provider, would they like to be referred to a provider to establish care?  N/a . She is going to schedule for an eye exam with Dr Dione Booze.    Dental Screening: Recommended annual dental exams for proper oral hygiene  Diabetic Foot Exam: Diabetic Foot Exam: Completed 08/09/2023  Community Resource Referral / Chronic Care Management: CRR required this visit?  Yes   CCM required this visit?  No     Plan:     I have personally reviewed and noted the following in the patient's chart:   Medical and social history Use of alcohol, tobacco or illicit drugs  Current medications and supplements including opioid prescriptions. Patient is currently taking opioid prescriptions. Information provided to patient regarding non-opioid alternatives. Patient advised to discuss non-opioid treatment plan with their provider. Functional ability and status Nutritional status Physical activity Advanced directives List  of other physicians Hospitalizations, surgeries, and ER visits in previous 12 months Vitals Screenings to include cognitive, depression, and falls Referrals and appointments  In addition, I have reviewed and discussed with patient certain preventive protocols, quality metrics, and best practice recommendations. A written personalized care plan for preventive services as well as general preventive health recommendations were provided to patient.     Esmond Harps, CMA   12/05/2023   After Visit Summary: (Mail) Due to this being a telephonic visit, the after visit summary with patients personalized plan was offered to patient via mail   Nurse Notes:   [name] Rice is a 68 y.o. female patient of Tandy Gaw PA-C who had a Medicare Annual Wellness Visit today via telephone. She is not very active. She has to use a wheel chair daily. Her daughter does live with her and helps her around the house. She enjoys jigsaw puzzles.   She has been scheduled with Lesly Rubenstein to talk about getting home health physical therapy.   Recommend a bone density. Order has been placed.

## 2023-12-05 NOTE — Addendum Note (Signed)
 Addended by: Chalmers Cater on: 12/05/2023 10:35 AM   Modules accepted: Orders

## 2023-12-12 ENCOUNTER — Ambulatory Visit (INDEPENDENT_AMBULATORY_CARE_PROVIDER_SITE_OTHER): Payer: Medicare HMO | Admitting: Physician Assistant

## 2023-12-12 ENCOUNTER — Encounter: Payer: Self-pay | Admitting: Physician Assistant

## 2023-12-12 VITALS — BP 114/70 | HR 63 | Temp 97.9°F | Ht 63.0 in | Wt 226.0 lb

## 2023-12-12 DIAGNOSIS — R300 Dysuria: Secondary | ICD-10-CM | POA: Diagnosis not present

## 2023-12-12 DIAGNOSIS — L304 Erythema intertrigo: Secondary | ICD-10-CM | POA: Diagnosis not present

## 2023-12-12 DIAGNOSIS — I959 Hypotension, unspecified: Secondary | ICD-10-CM | POA: Diagnosis not present

## 2023-12-12 DIAGNOSIS — N3090 Cystitis, unspecified without hematuria: Secondary | ICD-10-CM | POA: Diagnosis not present

## 2023-12-12 LAB — POCT URINALYSIS DIP (CLINITEK)
Blood, UA: NEGATIVE
Glucose, UA: 100 mg/dL — AB
Ketones, POC UA: NEGATIVE mg/dL
Nitrite, UA: NEGATIVE
POC PROTEIN,UA: 30 — AB
Spec Grav, UA: 1.025 (ref 1.010–1.025)
Urobilinogen, UA: 0.2 U/dL
pH, UA: 5.5 (ref 5.0–8.0)

## 2023-12-12 MED ORDER — AMOXICILLIN-POT CLAVULANATE 500-125 MG PO TABS: 1.0000 | ORAL_TABLET | Freq: Two times a day (BID) | ORAL | 0 refills | Status: DC

## 2023-12-12 MED ORDER — CLOTRIMAZOLE-BETAMETHASONE 1-0.05 % EX CREA: 1.0000 | TOPICAL_CREAM | Freq: Every day | CUTANEOUS | 3 refills | Status: DC

## 2023-12-12 MED ORDER — FLUCONAZOLE 150 MG PO TABS
ORAL_TABLET | ORAL | 0 refills | Status: DC
Start: 2023-12-12 — End: 2024-01-16

## 2023-12-12 MED ORDER — CEFTRIAXONE SODIUM 1 G IJ SOLR
1.0000 g | Freq: Once | INTRAMUSCULAR | Status: AC
  Administered 2023-12-12: 1 g via INTRAMUSCULAR

## 2023-12-12 NOTE — Progress Notes (Signed)
 Acute Office Visit  Subjective:     Patient ID: [name] Rice, female    DOB: 1956-09-07, 68 y.o.   MRN: [id number]  Chief Complaint  Patient presents with   Urinary Tract Infection    Pt states she has had burning when urinating x1 wk    Urinary Tract Infection  Associated symptoms include frequency and urgency. Pertinent negatives include no chills, hematuria, nausea or vomiting.   Patient is a 68 yo female who presents with a chief complaint of burning with urination and back pain for one week.she is accompanied by daughter. She denies fever and chills. Endorses feeling drowsy and some confusion. She also has recurrent yeast skin rashes. Nystain not working any more in her skin folds.   .. Active Ambulatory Problems    Diagnosis Date Noted   Type II or unspecified type diabetes mellitus with neurological manifestations, not stated as uncontrolled(250.60) 08/08/2006   Hyperlipidemia LDL goal <70 08/08/2006   Depression with anxiety 01/28/2008   Hypertension associated with diabetes (HCC) 08/08/2006   Allergic sinusitis 05/27/2009   GERD 08/08/2006   Acute bilateral low back pain 11/27/2007   OSA on CPAP    Insomnia 02/06/2011   Preventative health care 08/16/2011   History of allergic rhinitis 09/07/2011   Iron deficiency anemia 03/29/2012   Lipoma of abdominal wall 05/01/2012   Intestinal bacterial overgrowth 09/20/2012   SOB (shortness of breath) on exertion 03/31/2013   Hepatic steatosis 09/26/2013   Diverticulitis 11/27/2013   Diabetic peripheral neuropathy (HCC) 05/30/2014   Gastroparesis due to secondary diabetes (HCC) 09/16/2014   Ear pain 12/09/2014   Type 2 diabetes, controlled, with peripheral neuropathy (HCC) 12/09/2014   Diarrhea 01/12/2015   Non-proliferative diabetic retinopathy, right eye (HCC) 11/23/2015   Chronic pain syndrome 12/15/2015   Morbid obesity due to excess calories (HCC) 02/16/2016   Unstable balance 02/16/2016   Intertrigo 03/15/2016    Left knee pain 03/15/2016   Post-menopausal 03/16/2016   Acute reaction to stress 03/28/2016   Dystonia 09/21/2016   Neck pain 07/11/2017   Lisfranc's dislocation, right, initial encounter 10/06/2017   Freiberg's disease, right 10/06/2017   Vasomotor rhinitis 11/28/2017   Ear itching 01/17/2018   No energy 01/17/2018   Hordeolum externum of right lower eyelid 02/16/2018   Hot flashes 02/16/2018   Restrictive lung disease 04/12/2018   Mixed hyperlipidemia 04/12/2018   Vitamin D insufficiency 04/12/2018   Drug-induced constipation 05/23/2018   Calcification and ossification of muscle 07/13/2018   Pressure injury of right foot, unstageable (HCC) 09/25/2018   Bilateral carpal tunnel syndrome post carpal tunnel release on the left 01/24/2019   Type 2 diabetes mellitus with diabetic neuropathy, with long-term current use of insulin (HCC) 06/20/2019   Dizziness 06/20/2019   Acute right ankle pain 09/25/2019   OAB (overactive bladder) 09/25/2019   Chronic kidney disease (CKD), stage III (moderate) (HCC) 10/04/2019   Trigger finger, right middle finger 12/24/2019   Trigger index finger of right hand 12/24/2019   DDD (degenerative disc disease), cervical 12/27/2019   Bilateral rotator cuff syndrome 01/24/2020   Paresthesia 06/30/2020   Weakness 06/30/2020   Chronic fatigue 06/30/2020   Moderate episode of recurrent major depressive disorder (HCC) 07/14/2020   Left ventricular hypertrophy 07/23/2020   Problems with swallowing 03/22/2021   Allergic conjunctivitis of both eyes 03/22/2021   Rosacea 04/02/2021   Chronic right shoulder pain 04/02/2021   Partial nontraumatic tear of left rotator cuff 08/16/2021   Osteopenia 12/30/2021   Blood clot  in eye 12/31/2021   Diminished pulses in lower extremity 04/12/2022   Bilateral leg pain 07/27/2022   Itching 07/27/2022   Clostridioides difficile infection 09/05/2022   Fistula of sigmoid colon 09/05/2022   Acute left-sided low back pain with  left-sided sciatica 09/05/2022   Tubular adenoma of colon 09/16/2022   Cellulitis 01/06/2023   Fungal dermatitis 01/06/2023   OSA (obstructive sleep apnea) 05/15/2023   Chronic rhinitis 05/15/2023   Class 3 severe obesity due to excess calories with serious comorbidity and body mass index (BMI) of 40.0 to 44.9 in adult [facility]) 05/15/2023   Seasonal allergic rhinitis due to pollen 05/15/2023   Essential hypertension 05/15/2023   AKI (acute kidney injury) (HCC) 10/03/2023   Syncope and collapse 10/03/2023   Acute cystitis with hematuria 10/09/2023   Leukocytosis 10/09/2023   S/P cholecystectomy 10/20/2023   Rupture of operation wound 10/20/2023   Dysuria 12/12/2023   Hypotension 12/12/2023   Resolved Ambulatory Problems    Diagnosis Date Noted   IRON DEFICIENCY 01/30/2007   DEPRESSION 08/08/2006   TORTICOLLIS, SPASMODIC 02/13/2007   ALLERGIC RHINITIS 12/25/2006   ASTHMA 01/28/2008   COPD 08/08/2006   DIVERTICULOSIS, COLON 02/12/2003   DERMATITIS, SEBORRHEIC 01/30/2007   BPPV (benign paroxysmal positional vertigo), unspecified laterality 07/14/2009   GOUT, HX OF 08/08/2006   History of colonic polyps 09/16/2008   NEPHROLITHIASIS, HX OF 01/28/2008   Calf pain 11/29/2010   Tremor, essential 02/06/2011   Seborrheic dermatitis, unspecified 04/06/2011   Finger pain 04/06/2011   Shoulder pain, right 08/16/2011   Left leg pain 04/10/2012   Right ear pain 05/01/2012   Seborrheic dermatitis 05/01/2012   Hypoglycemia 05/01/2012   Vaginal candidiasis 08/22/2012   Sigmoid diverticulitis 09/15/2012   Acute-on-chronic kidney disease Stage II 09/15/2012   Cyst, breast 03/01/2013   Lactic acidosis 03/31/2013   Acute renal insufficiency 03/31/2013   Abdominal pain, left lower quadrant 04/29/2013   Gout 05/01/2013   COPD (chronic obstructive pulmonary disease) with chronic bronchitis (HCC) 12/09/2014   Right shoulder pain 02/16/2016   Fall 03/16/2016   Foot fracture, right, closed,  initial encounter 08/23/2017   Chronic pain of both shoulders 12/24/2019   Past Medical History:  Diagnosis Date   Allergy    Anxiety    Arthritis    Asthma    Carpal tunnel syndrome, bilateral    Cataract 2020   Cervical dystonia    COPD (chronic obstructive pulmonary disease) (HCC)    Depression    Diabetes mellitus    Diverticulosis    Gastroparesis    GERD (gastroesophageal reflux disease)    History of kidney stones    History of seborrheic dermatitis    HLD (hyperlipidemia)    Hypertension    Intussusception (HCC)    Lisfranc's sprain, left, initial encounter 10/06/2017   Lumbar pain 2011   Morbid obesity (HCC)    Nephrolithiasis    Osteoporosis 2018   Pneumonia    Recurrent boils    Sleep apnea    Torn rotator cuff RT SHOULDER   Tremor CCENTRAL NERVOUS SYSTEM    Review of Systems  Constitutional:  Negative for chills and fever.  Gastrointestinal:  Negative for nausea and vomiting.  Genitourinary:  Positive for dysuria, frequency and urgency. Negative for hematuria.  Musculoskeletal:  Positive for back pain.  Skin: Negative.   All other systems reviewed and are negative.     Objective:    BP (!) 79/59 (BP Location: Right Arm, Patient Position: Sitting, Cuff Size: Large)  Pulse 63   Temp 97.9 F (36.6 C) (Oral)   Ht 5\' 3"  (1.6 m)   Wt 226 lb (102.5 kg)   LMP 10/18/1995   SpO2 94%   BMI 40.03 kg/m  BP Readings from Last 3 Encounters:  12/12/23 (!) 79/59  11/10/23 (!) 151/88  11/07/23 (!) 161/77   Wt Readings from Last 3 Encounters:  12/12/23 226 lb (102.5 kg)  12/05/23 233 lb (105.7 kg)  10/03/23 251 lb 11.2 oz (114.2 kg)   SpO2 Readings from Last 3 Encounters:  12/12/23 94%  11/10/23 99%  11/07/23 99%   Physical Exam Constitutional:      Appearance: She is obese.  HENT:     Head: Normocephalic and atraumatic.  Eyes:     Extraocular Movements: Extraocular movements intact.  Cardiovascular:     Rate and Rhythm: Normal rate and regular  rhythm.     Heart sounds: Normal heart sounds.  Pulmonary:     Effort: Pulmonary effort is normal.     Breath sounds: Normal breath sounds.  Abdominal:     General: There is no distension.     Palpations: Abdomen is soft. There is no mass.     Tenderness: There is abdominal tenderness. There is right CVA tenderness and left CVA tenderness. There is no guarding or rebound.     Comments: Suprapubic and lower abdominal tenderness.   Musculoskeletal:     Cervical back: Normal range of motion.     Right lower leg: No edema.     Left lower leg: No edema.  Skin:    Findings: Lesion and rash present.     Comments: Yeast( macular moist erythematous rash)under both axilla bilaterally and under the abdominal skin folds.   Neurological:     Mental Status: She is alert and oriented to person, place, and time.  Psychiatric:        Mood and Affect: Mood normal.      .. Results for orders placed or performed in visit on 12/12/23  CBC w/Diff/Platelet   Collection Time: 12/12/23  2:17 PM  Result Value Ref Range   WBC 7.6 3.4 - 10.8 x10E3/uL   RBC 4.59 3.77 - 5.28 x10E6/uL   Hemoglobin 13.3 11.1 - 15.9 g/dL   Hematocrit 32.4 40.1 - 46.6 %   MCV 92 79 - 97 fL   MCH 29.0 26.6 - 33.0 pg   MCHC 31.7 31.5 - 35.7 g/dL   RDW 02.7 25.3 - 66.4 %   Platelets 264 150 - 450 x10E3/uL   Neutrophils 64 Not Estab. %   Lymphs 24 Not Estab. %   Monocytes 6 Not Estab. %   Eos 5 Not Estab. %   Basos 1 Not Estab. %   Neutrophils Absolute 4.9 1.4 - 7.0 x10E3/uL   Lymphocytes Absolute 1.8 0.7 - 3.1 x10E3/uL   Monocytes Absolute 0.4 0.1 - 0.9 x10E3/uL   EOS (ABSOLUTE) 0.4 0.0 - 0.4 x10E3/uL   Basophils Absolute 0.0 0.0 - 0.2 x10E3/uL   Immature Granulocytes 0 Not Estab. %   Immature Grans (Abs) 0.0 0.0 - 0.1 x10E3/uL  CMP14+EGFR   Collection Time: 12/12/23  2:17 PM  Result Value Ref Range   Glucose 262 (H) 70 - 99 mg/dL   BUN 22 8 - 27 mg/dL   Creatinine, Ser 4.03 (H) 0.57 - 1.00 mg/dL   eGFR 42 (L) >47  QQ/VZD/6.38   BUN/Creatinine Ratio 16 12 - 28   Sodium 140 134 - 144 mmol/L  Potassium 4.1 3.5 - 5.2 mmol/L   Chloride 101 96 - 106 mmol/L   CO2 17 (L) 20 - 29 mmol/L   Calcium 9.0 8.7 - 10.3 mg/dL   Total Protein 6.4 6.0 - 8.5 g/dL   Albumin 3.6 (L) 3.9 - 4.9 g/dL   Globulin, Total 2.8 1.5 - 4.5 g/dL   Bilirubin Total 0.4 0.0 - 1.2 mg/dL   Alkaline Phosphatase 112 44 - 121 IU/L   AST 17 0 - 40 IU/L   ALT 11 0 - 32 IU/L  Procalcitonin   Collection Time: 12/12/23  2:17 PM  Result Value Ref Range   Procalcitonin WILL FOLLOW   POCT URINALYSIS DIP (CLINITEK)   Collection Time: 12/12/23  5:04 PM  Result Value Ref Range   Color, UA yellow yellow   Clarity, UA clear clear   Glucose, UA =100 (A) negative mg/dL   Bilirubin, UA small (A) negative   Ketones, POC UA negative negative mg/dL   Spec Grav, UA 1.610 9.604 - 1.025   Blood, UA negative negative   pH, UA 5.5 5.0 - 8.0   POC PROTEIN,UA =30 (A) negative, trace   Urobilinogen, UA 0.2 0.2 or 1.0 E.U./dL   Nitrite, UA Negative Negative   Leukocytes, UA Small (1+) (A) Negative   *Note: Due to a large number of results and/or encounters for the requested time period, some results have not been displayed. A complete set of results can be found in Results Review.    Assessment & Plan:  Marland KitchenMarland KitchenSherrelle was seen today for urinary tract infection.  Diagnoses and all orders for this visit:  Dysuria -     POCT URINALYSIS DIP (CLINITEK) -     Cancel: Urine Culture -     CBC w/Diff/Platelet -     CMP14+EGFR -     Procalcitonin -     WET PREP FOR TRICH, YEAST, CLUE -     Urine Culture  Intertrigo -     fluconazole (DIFLUCAN) 150 MG tablet; Take one tablet now and then repeat in 48 to 72 hours and then when done with antibiotic. -     clotrimazole-betamethasone (LOTRISONE) cream; Apply 1 Application topically daily.  Hypotension, unspecified hypotension type -     CBC w/Diff/Platelet -     CMP14+EGFR -     Procalcitonin  Cystitis -      CBC w/Diff/Platelet -     CMP14+EGFR -     Procalcitonin -     amoxicillin-clavulanate (AUGMENTIN) 500-125 MG tablet; Take 1 tablet by mouth 2 (two) times daily. -     cefTRIAXone (ROCEPHIN) injection 1 g -     Urine Culture    Concerned with low BP. Pt has not had anything to drink today. BP did go up with glass of water.  UA showed glucose, protein, leukocytes, bilirubin Will culture Concern for UTI and with flank pain perhaps pyelonephritis CBC, procalcitonin, cmp ordered Rocephin given today IM  Start augmentin for 10 days.  Will check wet prep  Pt has yeast infection under left axilla and abdominal folds. Given diflucan 3 doses and another topical cream to try.    Tandy Gaw, PA-C

## 2023-12-12 NOTE — Patient Instructions (Addendum)
 Start augmentin tomorrow Diflucan for yeast orally Use topical Lotrisone in skin folds DRINK water!!!!

## 2023-12-13 ENCOUNTER — Encounter: Payer: Self-pay | Admitting: Physician Assistant

## 2023-12-13 ENCOUNTER — Other Ambulatory Visit: Payer: Self-pay | Admitting: Physician Assistant

## 2023-12-13 LAB — CBC WITH DIFFERENTIAL/PLATELET
Basophils Absolute: 0 10*3/uL (ref 0.0–0.2)
Basos: 1 %
EOS (ABSOLUTE): 0.4 10*3/uL (ref 0.0–0.4)
Eos: 5 %
Hematocrit: 42 % (ref 34.0–46.6)
Hemoglobin: 13.3 g/dL (ref 11.1–15.9)
Immature Grans (Abs): 0 10*3/uL (ref 0.0–0.1)
Immature Granulocytes: 0 %
Lymphocytes Absolute: 1.8 10*3/uL (ref 0.7–3.1)
Lymphs: 24 %
MCH: 29 pg (ref 26.6–33.0)
MCHC: 31.7 g/dL (ref 31.5–35.7)
MCV: 92 fL (ref 79–97)
Monocytes Absolute: 0.4 10*3/uL (ref 0.1–0.9)
Monocytes: 6 %
Neutrophils Absolute: 4.9 10*3/uL (ref 1.4–7.0)
Neutrophils: 64 %
Platelets: 264 10*3/uL (ref 150–450)
RBC: 4.59 x10E6/uL (ref 3.77–5.28)
RDW: 13.1 % (ref 11.7–15.4)
WBC: 7.6 10*3/uL (ref 3.4–10.8)

## 2023-12-13 LAB — WET PREP FOR TRICH, YEAST, CLUE
Clue Cell Exam: POSITIVE — AB
Trichomonas Exam: NEGATIVE
Yeast Exam: NEGATIVE

## 2023-12-13 LAB — CMP14+EGFR
ALT: 11 IU/L (ref 0–32)
AST: 17 IU/L (ref 0–40)
Albumin: 3.6 g/dL — ABNORMAL LOW (ref 3.9–4.9)
Alkaline Phosphatase: 112 IU/L (ref 44–121)
BUN/Creatinine Ratio: 16 (ref 12–28)
BUN: 22 mg/dL (ref 8–27)
Bilirubin Total: 0.4 mg/dL (ref 0.0–1.2)
CO2: 17 mmol/L — ABNORMAL LOW (ref 20–29)
Calcium: 9 mg/dL (ref 8.7–10.3)
Chloride: 101 mmol/L (ref 96–106)
Creatinine, Ser: 1.36 mg/dL — ABNORMAL HIGH (ref 0.57–1.00)
Globulin, Total: 2.8 g/dL (ref 1.5–4.5)
Glucose: 262 mg/dL — ABNORMAL HIGH (ref 70–99)
Potassium: 4.1 mmol/L (ref 3.5–5.2)
Sodium: 140 mmol/L (ref 134–144)
Total Protein: 6.4 g/dL (ref 6.0–8.5)
eGFR: 42 mL/min/{1.73_m2} — ABNORMAL LOW (ref >59)

## 2023-12-13 LAB — PROCALCITONIN: Procalcitonin: 0.05 ng/mL (ref 0.00–0.08)

## 2023-12-13 MED ORDER — METRONIDAZOLE 500 MG PO TABS: 500.0000 mg | ORAL_TABLET | Freq: Two times a day (BID) | ORAL | 0 refills | Status: DC

## 2023-12-13 NOTE — Progress Notes (Signed)
 [name] Rice,   Kidney function down some.  Electrolytes look good.  WBC and pro calcitonin good meaning low risk for systemic infection.  Bacterial vaginosis detected. Will send metronidazole.

## 2023-12-14 LAB — URINE CULTURE: Organism ID, Bacteria: NO GROWTH

## 2023-12-15 NOTE — Progress Notes (Signed)
 Stop augmentin. No bacteria growth in urine. Your symptoms must have been from bacterial vaginosis. Continue talking metronidazole.

## 2023-12-18 ENCOUNTER — Ambulatory Visit: Payer: Self-pay | Admitting: Physician Assistant

## 2023-12-18 NOTE — Telephone Encounter (Addendum)
 Copied from CRM [id number]. Topic: Clinical - Red Word Triage >> Dec 18, 2023 11:05 AM Tiffany S wrote: [facility] that prompted transfer to Nurse Triage: Patient daughter called stating patient is having hallucinations.   Pt's daughter Ronnell Freshwater is calling on behalf of pt.  Chief Complaint: hallucinations Symptoms: hallucinatory episodes, agitation, interrupted sleep "awake for over 24 hours" Frequency: sporadic Pertinent Negatives: Patient denies slurred speech, more weakness than usual, head injury, violence Disposition: [] 911 / [] ED /[] Urgent Care (no appt availability in office) / [] Appointment(In office/virtual)/ []  [facility]/ [] Home Care/ [x] Refused Recommended Disposition /[] [facility]/ []  Follow-up with PCP Additional Notes: Pt daughter is poor historian. Pt daughter reporting that pt been experiencing hallucinations "recently" over past weeks not months, and some confusion. Pt daughter is concerned for onset of dementia. Pt daughter reporting pt has not been able to check her blood sugar lately with diabetes since got "strips but not the new machine" - please advise. Pt daughter confirms currently sleeping but been alert and oriented, no current episode, confirms no sweating, polydipsia, polyphagia, or more weakness than usual. Advised pt be examined in hospital right away, offered to call 911 for family, pt daughter declines, will take pt to ED.  Reason for Disposition  Seeing, hearing, or feeling things that are not there (i.e., visual, auditory, or tactile hallucinations)  Answer Assessment - Initial Assessment Questions 1. LEVEL OF CONSCIOUSNESS: "How is he (she, the patient) acting right now?" (e.g., alert-oriented, confused, lethargic, stuporous, comatose)     Right now she seems alert, asleep right now Spent 20 min with her convincing her no crab coming out of the drain coming to eat her cushion, cushion fell apart and pieces of it were in the bottom of  a tub, swore there was a crab coming out of the drain tying to eat those pieces, she screamed and announced there was a crab coming out of the drain, had to clean the drain before she would believe me that no crab in there Mixing up her math Had been awake for over 24 hour Noticed a couple of episodes where she seems to have had Last week she saw a ghost, just a trick of my eyes, she has cataracts Yelled at me Sure she would deny all of this to doc 2. ONSET: "When did the confusion start?"  (minutes, hours, days)     Weeks Not daily, sporadic, just recently 3. PATTERN "Does this come and go, or has it been constant since it started?"  "Is it present now?"     Comes and goes, can be fine and be perfectly normal and one will come on (episodes) 4. ALCOHOL or DRUGS: "Has he been drinking alcohol or taking any drugs?"      denies 5. NARCOTIC MEDICINES: "Has he been receiving any narcotic medications?" (e.g., morphine, Vicodin)     denies 6. CAUSE: "What do you think is causing the confusion?"      Concerned for dementia 7. OTHER SYMPTOMS: "Are there any other symptoms?" (e.g., difficulty breathing, headache, fever, weakness)     Denies Surgery recently in the last few months Always had weakness Diabetes, Not been able to check her blood sugar lately sent the strips but not the new machine, Not sweating, hungry, thirsty  Protocols used: Confusion - Delirium-A-AH

## 2023-12-20 ENCOUNTER — Other Ambulatory Visit: Payer: Medicare HMO

## 2023-12-20 NOTE — Telephone Encounter (Signed)
 Attempted call to see if patient went to ER as could not see this on patient chart. Left a voice mail message requesting a return call.

## 2023-12-22 NOTE — Telephone Encounter (Signed)
 Again attempted call to patient - again left voice mail message requesting a return call.

## 2023-12-24 ENCOUNTER — Other Ambulatory Visit: Payer: Self-pay | Admitting: Physician Assistant

## 2024-01-02 ENCOUNTER — Ambulatory Visit: Payer: Self-pay | Admitting: Physician Assistant

## 2024-01-02 DIAGNOSIS — Z794 Long term (current) use of insulin: Secondary | ICD-10-CM | POA: Diagnosis not present

## 2024-01-02 DIAGNOSIS — R441 Visual hallucinations: Secondary | ICD-10-CM | POA: Diagnosis not present

## 2024-01-02 DIAGNOSIS — N183 Chronic kidney disease, stage 3 unspecified: Secondary | ICD-10-CM | POA: Diagnosis not present

## 2024-01-02 DIAGNOSIS — N390 Urinary tract infection, site not specified: Secondary | ICD-10-CM | POA: Diagnosis not present

## 2024-01-02 DIAGNOSIS — I129 Hypertensive chronic kidney disease with stage 1 through stage 4 chronic kidney disease, or unspecified chronic kidney disease: Secondary | ICD-10-CM | POA: Diagnosis not present

## 2024-01-02 DIAGNOSIS — E785 Hyperlipidemia, unspecified: Secondary | ICD-10-CM | POA: Diagnosis not present

## 2024-01-02 DIAGNOSIS — R4182 Altered mental status, unspecified: Secondary | ICD-10-CM | POA: Diagnosis not present

## 2024-01-02 DIAGNOSIS — G4730 Sleep apnea, unspecified: Secondary | ICD-10-CM | POA: Diagnosis not present

## 2024-01-02 DIAGNOSIS — N179 Acute kidney failure, unspecified: Secondary | ICD-10-CM | POA: Diagnosis not present

## 2024-01-02 DIAGNOSIS — E1122 Type 2 diabetes mellitus with diabetic chronic kidney disease: Secondary | ICD-10-CM | POA: Diagnosis not present

## 2024-01-02 NOTE — Telephone Encounter (Signed)
 Chief Complaint: Hallucinations/headache Symptoms: see above Frequency: Ongoing since last appt with PCP, but worsening Pertinent Negatives: Patient denies new weakness/numbness/tingling, neck stiffness, fever, cold/flu symptoms Disposition: [x] ED /[] Urgent Care (no appt availability in office) / [] Appointment(In office/virtual)/ []  [facility]/ [] Home Care/ [] Refused Recommended Disposition /[] [facility]/ []  Follow-up with PCP Additional Notes: Patient called in to report she is still having hallucinations. Patient was seen in office in late February by provider and treated for dysuria. Patient states she reported hallucinations at that time, but they are worsening. Patient states today she saw someone standing in her kitchen talking on the phone, but is able to recognize that no one is actually there. Patient appears to be aware and in control, but cannot get rid of hallucinations. Patient also states when she is experiencing hallucinations, she has a very mild headache across her forehead. Patient advised to go to ED now for evaluation. Daughter states she will take her now.    Copied from CRM [id number]. Topic: Clinical - Red Word Triage >> Jan 02, 2024  3:21 PM Carlatta H wrote: [facility] that prompted transfer to Nurse Triage: Patient stated she is seed people that are not there and she's having headaches//This is been happening everyday for 1 week// Reason for Disposition  [1] SEVERE headache (e.g., excruciating) AND [2] "worst headache" of life    Not worse headache of life - but patient is hallucinating  Answer Assessment - Initial Assessment Questions 1. LOCATION: "Where does it hurt?"      Forehead 2. ONSET: "When did the headache start?" (Minutes, hours or days)      A few days ago 3. PATTERN: "Does the pain come and go, or has it been constant since it started?"     Comes and goes (has headache when hallucinating)  4. SEVERITY: "How bad is the pain?" and "What  does it keep you from doing?"  (e.g., Scale 1-10; mild, moderate, or severe)   - MILD (1-3): doesn't interfere with normal activities    - MODERATE (4-7): interferes with normal activities or awakens from sleep    - SEVERE (8-10): excruciating pain, unable to do any normal activities        3 5. RECURRENT SYMPTOM: "Have you ever had headaches before?" If Yes, ask: "When was the last time?" and "What happened that time?"      no 6. CAUSE: "What do you think is causing the headache?"     Unsure - related to hallucinate 7. MIGRAINE: "Have you been diagnosed with migraine headaches?" If Yes, ask: "Is this headache similar?"       No 8. HEAD INJURY: "Has there been any recent injury to the head?"      No 9. OTHER SYMPTOMS: "Do you have any other symptoms?" (fever, stiff neck, eye pain, sore throat, cold symptoms)     Hallucinations  Protocols used: Headache-A-AH

## 2024-01-03 ENCOUNTER — Other Ambulatory Visit

## 2024-01-11 ENCOUNTER — Encounter: Payer: Self-pay | Admitting: Physician Assistant

## 2024-01-16 ENCOUNTER — Encounter: Payer: Self-pay | Admitting: Physician Assistant

## 2024-01-16 ENCOUNTER — Ambulatory Visit: Admitting: Physician Assistant

## 2024-01-16 ENCOUNTER — Telehealth: Payer: Self-pay

## 2024-01-16 ENCOUNTER — Other Ambulatory Visit (HOSPITAL_COMMUNITY): Payer: Self-pay

## 2024-01-16 VITALS — BP 138/45 | HR 70 | Ht 63.0 in | Wt 226.8 lb

## 2024-01-16 DIAGNOSIS — G4733 Obstructive sleep apnea (adult) (pediatric): Secondary | ICD-10-CM | POA: Diagnosis not present

## 2024-01-16 DIAGNOSIS — Z9181 History of falling: Secondary | ICD-10-CM | POA: Insufficient documentation

## 2024-01-16 DIAGNOSIS — R29898 Other symptoms and signs involving the musculoskeletal system: Secondary | ICD-10-CM

## 2024-01-16 DIAGNOSIS — Z794 Long term (current) use of insulin: Secondary | ICD-10-CM

## 2024-01-16 DIAGNOSIS — E1142 Type 2 diabetes mellitus with diabetic polyneuropathy: Secondary | ICD-10-CM

## 2024-01-16 DIAGNOSIS — N3001 Acute cystitis with hematuria: Secondary | ICD-10-CM | POA: Diagnosis not present

## 2024-01-16 DIAGNOSIS — N179 Acute kidney failure, unspecified: Secondary | ICD-10-CM

## 2024-01-16 DIAGNOSIS — Z23 Encounter for immunization: Secondary | ICD-10-CM | POA: Diagnosis not present

## 2024-01-16 DIAGNOSIS — E1140 Type 2 diabetes mellitus with diabetic neuropathy, unspecified: Secondary | ICD-10-CM | POA: Diagnosis not present

## 2024-01-16 DIAGNOSIS — G894 Chronic pain syndrome: Secondary | ICD-10-CM | POA: Diagnosis not present

## 2024-01-16 DIAGNOSIS — L304 Erythema intertrigo: Secondary | ICD-10-CM | POA: Diagnosis not present

## 2024-01-16 DIAGNOSIS — E66813 Obesity, class 3: Secondary | ICD-10-CM

## 2024-01-16 LAB — POCT GLYCOSYLATED HEMOGLOBIN (HGB A1C): Hemoglobin A1C: 7.1 % — AB (ref 4.0–5.6)

## 2024-01-16 LAB — POCT URINALYSIS DIP (CLINITEK)
Bilirubin, UA: NEGATIVE
Glucose, UA: NEGATIVE mg/dL
Ketones, POC UA: NEGATIVE mg/dL
Nitrite, UA: NEGATIVE
Spec Grav, UA: 1.02 (ref 1.010–1.025)
Urobilinogen, UA: 0.2 U/dL
pH, UA: 5.5 (ref 5.0–8.0)

## 2024-01-16 LAB — POCT UA - MICROALBUMIN
Creatinine, POC: 200 mg/dL
Microalbumin Ur, POC: 80 mg/L

## 2024-01-16 MED ORDER — AMBULATORY NON FORMULARY MEDICATION: 0 refills | Status: AC

## 2024-01-16 MED ORDER — NITROFURANTOIN MONOHYD MACRO 100 MG PO CAPS
100.0000 mg | ORAL_CAPSULE | Freq: Two times a day (BID) | ORAL | 0 refills | Status: DC
Start: 2024-01-16 — End: 2024-04-22

## 2024-01-16 MED ORDER — NYSTATIN 100000 UNIT/GM EX POWD: 1.0000 | Freq: Three times a day (TID) | CUTANEOUS | 3 refills | Status: DC

## 2024-01-16 NOTE — Progress Notes (Signed)
 Acute Office Visit  Subjective:     Patient ID: [name] Rice, female    DOB: [birth year], 68 y.o.   MRN: [id number]  Chief Complaint  Patient presents with   Incisional Pain    HPI Patient is in today for follow up from ED visit on 01/02/24 for visual hallucinations and UTI. UA abnormal and culture was sent. GFR was 43. Ct of head no acute changes. She left AMA and was called back to ED but never given in medication and waiting for hours so pt left. She has not had any antibiotics for UTI. Her hallucinations have resolved.   Pt's CPAP machine is not working and would like to know if she is on the right setting.   She is having a lot of problems bathing and really needs walk in shower. If appropriate needs a note for apartment.   .. Active Ambulatory Problems    Diagnosis Date Noted   Type II or unspecified type diabetes mellitus with neurological manifestations, not stated as uncontrolled(250.60) 08/08/2006   Hyperlipidemia LDL goal <70 08/08/2006   Depression with anxiety 01/28/2008   Hypertension associated with diabetes (HCC) 08/08/2006   Allergic sinusitis 05/27/2009   GERD 08/08/2006   Acute bilateral low back pain 11/27/2007   OSA on CPAP    Insomnia 02/06/2011   Preventative health care 08/16/2011   History of allergic rhinitis 09/07/2011   Iron deficiency anemia 03/29/2012   Lipoma of abdominal wall 05/01/2012   Intestinal bacterial overgrowth 09/20/2012   SOB (shortness of breath) on exertion 03/31/2013   Hepatic steatosis 09/26/2013   Diverticulitis 11/27/2013   Diabetic peripheral neuropathy (HCC) 05/30/2014   Gastroparesis due to secondary diabetes (HCC) 09/16/2014   Ear pain 12/09/2014   Type 2 diabetes, controlled, with peripheral neuropathy (HCC) 12/09/2014   Diarrhea 01/12/2015   Non-proliferative diabetic retinopathy, right eye (HCC) 11/23/2015   Chronic pain syndrome 12/15/2015   Morbid obesity due to excess calories (HCC) 02/16/2016   Unstable  balance 02/16/2016   Intertrigo 03/15/2016   Left knee pain 03/15/2016   Post-menopausal 03/16/2016   Acute reaction to stress 03/28/2016   Dystonia 09/21/2016   Neck pain 07/11/2017   Lisfranc's dislocation, right, initial encounter 10/06/2017   Freiberg's disease, right 10/06/2017   Vasomotor rhinitis 11/28/2017   Ear itching 01/17/2018   No energy 01/17/2018   Hordeolum externum of right lower eyelid 02/16/2018   Hot flashes 02/16/2018   Restrictive lung disease 04/12/2018   Mixed hyperlipidemia 04/12/2018   Vitamin D insufficiency 04/12/2018   Drug-induced constipation 05/23/2018   Calcification and ossification of muscle 07/13/2018   Pressure injury of right foot, unstageable (HCC) 09/25/2018   Bilateral carpal tunnel syndrome post carpal tunnel release on the left 01/24/2019   Type 2 diabetes mellitus with diabetic neuropathy, with long-term current use of insulin (HCC) 06/20/2019   Dizziness 06/20/2019   Acute right ankle pain 09/25/2019   OAB (overactive bladder) 09/25/2019   Chronic kidney disease (CKD), stage III (moderate) (HCC) 10/04/2019   Trigger finger, right middle finger 12/24/2019   Trigger index finger of right hand 12/24/2019   DDD (degenerative disc disease), cervical 12/27/2019   Bilateral rotator cuff syndrome 01/24/2020   Paresthesia 06/30/2020   Weakness 06/30/2020   Chronic fatigue 06/30/2020   Moderate episode of recurrent major depressive disorder (HCC) 07/14/2020   Left ventricular hypertrophy 07/23/2020   Problems with swallowing 03/22/2021   Allergic conjunctivitis of both eyes 03/22/2021   Rosacea 04/02/2021   Chronic right  shoulder pain 04/02/2021   Partial nontraumatic tear of left rotator cuff 08/16/2021   Osteopenia 12/30/2021   Blood clot in eye 12/31/2021   Diminished pulses in lower extremity 04/12/2022   Bilateral leg pain 07/27/2022   Itching 07/27/2022   Clostridioides difficile infection 09/05/2022   Fistula of sigmoid colon  09/05/2022   Acute left-sided low back pain with left-sided sciatica 09/05/2022   Tubular adenoma of colon 09/16/2022   Cellulitis 01/06/2023   Fungal dermatitis 01/06/2023   OSA (obstructive sleep apnea) 05/15/2023   Chronic rhinitis 05/15/2023   Class 3 severe obesity due to excess calories with serious comorbidity and body mass index (BMI) of 40.0 to 44.9 in adult [facility]) 05/15/2023   Seasonal allergic rhinitis due to pollen 05/15/2023   Essential hypertension 05/15/2023   AKI (acute kidney injury) (HCC) 10/03/2023   Syncope and collapse 10/03/2023   Acute cystitis with hematuria 10/09/2023   Leukocytosis 10/09/2023   S/P cholecystectomy 10/20/2023   Rupture of operation wound 10/20/2023   Dysuria 12/12/2023   Hypotension 12/12/2023   At high risk for falls 01/16/2024   Resolved Ambulatory Problems    Diagnosis Date Noted   IRON DEFICIENCY 01/30/2007   DEPRESSION 08/08/2006   TORTICOLLIS, SPASMODIC 02/13/2007   ALLERGIC RHINITIS 12/25/2006   ASTHMA 01/28/2008   COPD 08/08/2006   DIVERTICULOSIS, COLON 02/12/2003   DERMATITIS, SEBORRHEIC 01/30/2007   BPPV (benign paroxysmal positional vertigo), unspecified laterality 07/14/2009   GOUT, HX OF 08/08/2006   History of colonic polyps 09/16/2008   NEPHROLITHIASIS, HX OF 01/28/2008   Calf pain 11/29/2010   Tremor, essential 02/06/2011   Seborrheic dermatitis, unspecified 04/06/2011   Finger pain 04/06/2011   Shoulder pain, right 08/16/2011   Left leg pain 04/10/2012   Right ear pain 05/01/2012   Seborrheic dermatitis 05/01/2012   Hypoglycemia 05/01/2012   Vaginal candidiasis 08/22/2012   Sigmoid diverticulitis 09/15/2012   Acute-on-chronic kidney disease Stage II 09/15/2012   Cyst, breast 03/01/2013   Lactic acidosis 03/31/2013   Acute renal insufficiency 03/31/2013   Abdominal pain, left lower quadrant 04/29/2013   Gout 05/01/2013   COPD (chronic obstructive pulmonary disease) with chronic bronchitis (HCC) 12/09/2014    Right shoulder pain 02/16/2016   Fall 03/16/2016   Foot fracture, right, closed, initial encounter 08/23/2017   Chronic pain of both shoulders 12/24/2019   Past Medical History:  Diagnosis Date   Allergy    Anxiety    Arthritis    Asthma    Carpal tunnel syndrome, bilateral    Cataract 2020   Cervical dystonia    COPD (chronic obstructive pulmonary disease) (HCC)    Depression    Diabetes mellitus    Diverticulosis    Gastroparesis    GERD (gastroesophageal reflux disease)    History of kidney stones    History of seborrheic dermatitis    HLD (hyperlipidemia)    Hypertension    Intussusception (HCC)    Lisfranc's sprain, left, initial encounter 10/06/2017   Lumbar pain 2011   Morbid obesity (HCC)    Nephrolithiasis    Osteoporosis 2018   Pneumonia    Recurrent boils    Sleep apnea    Torn rotator cuff RT SHOULDER   Tremor CCENTRAL NERVOUS SYSTEM      ROS See HPI.      Objective:    BP (!) 138/45 (BP Location: Left Arm, Patient Position: Sitting, Cuff Size: Large)   Pulse 70   Ht 5\' 3"  (1.6 m)   Wt 226 lb  12 oz (102.9 kg)   LMP 10/18/1995   SpO2 96%   BMI 40.17 kg/m  BP Readings from Last 3 Encounters:  01/16/24 (!) 138/45  12/12/23 114/70  11/10/23 (!) 151/88   Wt Readings from Last 3 Encounters:  01/16/24 226 lb 12 oz (102.9 kg)  12/12/23 226 lb (102.5 kg)  12/05/23 233 lb (105.7 kg)      Physical Exam Constitutional:      Appearance: Normal appearance. She is obese.  HENT:     Head: Normocephalic.  Cardiovascular:     Rate and Rhythm: Normal rate.  Pulmonary:     Effort: Pulmonary effort is normal.  Abdominal:     General: There is no distension.     Palpations: Abdomen is soft. There is no mass.     Tenderness: There is no abdominal tenderness. There is no right CVA tenderness, left CVA tenderness, guarding or rebound.  Musculoskeletal:     Right lower leg: No edema.     Left lower leg: No edema.  Neurological:     General: No focal  deficit present.     Mental Status: She is oriented to person, place, and time.  Psychiatric:        Mood and Affect: Mood normal.          Assessment & Plan:  Marland KitchenMarland KitchenArshi was seen today for incisional pain.  Diagnoses and all orders for this visit:  Acute cystitis with hematuria -     CBC w/Diff/Platelet -     CMP14+EGFR -     nitrofurantoin, macrocrystal-monohydrate, (MACROBID) 100 MG capsule; Take 1 capsule (100 mg total) by mouth 2 (two) times daily. -     POCT URINALYSIS DIP (CLINITEK) -     Urine Culture -     POCT UA - Microalbumin  AKI (acute kidney injury) (HCC) -     CBC w/Diff/Platelet -     CMP14+EGFR -     POCT UA - Microalbumin  Intertrigo -     nystatin (MYCOSTATIN/NYSTOP) powder; Apply 1 Application topically 3 (three) times daily.  At high risk for falls  Encounter for immunization -     Flu Vaccine Trivalent High Dose (Fluad) -     Pfizer Comirnaty Covid-19 Vaccine 63yrs & older  Type 2 diabetes mellitus with diabetic neuropathy, with long-term current use of insulin (HCC) -     POCT HgB A1C  OSA on CPAP -     Cpap titration; Future  Diabetic peripheral neuropathy (HCC) -     AMBULATORY NON FORMULARY MEDICATION; Walk in shower needed in apartment due to mobility issues and high fall risk.  Chronic pain syndrome -     AMBULATORY NON FORMULARY MEDICATION; Walk in shower needed in apartment due to mobility issues and high fall risk.  Bilateral leg weakness -     AMBULATORY NON FORMULARY MEDICATION; Walk in shower needed in apartment due to mobility issues and high fall risk.  Class 3 severe obesity due to excess calories with serious comorbidity and body mass index (BMI) of 40.0 to 44.9 in adult (HCC) -     AMBULATORY NON FORMULARY MEDICATION; Walk in shower needed in apartment due to mobility issues and high fall risk.   Start macrobid due to UA in ED Will culture and treat accordingly Recheck GFR and potassium from ED Stay hydrated  Pt would  benefit for walk in shower and rx given  Needs new CPAP titration, ordered today  Tandy Gaw, PA-C

## 2024-01-16 NOTE — Telephone Encounter (Signed)
 Pharmacy Patient Advocate Encounter   Received notification from Onbase that prior authorization for Klayesta 100000 UNIT/GM powder is required/requested.   Insurance verification completed.   The patient is insured through [employer] .   Per test claim: PA required; PA submitted to above mentioned insurance via CoverMyMeds Key/confirmation #/EOC [id number] Status is pending

## 2024-01-16 NOTE — Patient Instructions (Signed)
 Start macrobid for 7 days for UTI Will culture UA Will get labs today

## 2024-01-17 ENCOUNTER — Telehealth: Payer: Self-pay

## 2024-01-17 ENCOUNTER — Other Ambulatory Visit: Payer: Self-pay | Admitting: Physician Assistant

## 2024-01-17 DIAGNOSIS — G894 Chronic pain syndrome: Secondary | ICD-10-CM

## 2024-01-17 LAB — CBC WITH DIFFERENTIAL/PLATELET
Basophils Absolute: 0.1 10*3/uL (ref 0.0–0.2)
Basos: 1 %
EOS (ABSOLUTE): 0.3 10*3/uL (ref 0.0–0.4)
Eos: 3 %
Hematocrit: 40 % (ref 34.0–46.6)
Hemoglobin: 13.2 g/dL (ref 11.1–15.9)
Immature Grans (Abs): 0 10*3/uL (ref 0.0–0.1)
Immature Granulocytes: 0 %
Lymphocytes Absolute: 1.7 10*3/uL (ref 0.7–3.1)
Lymphs: 20 %
MCH: 29.5 pg (ref 26.6–33.0)
MCHC: 33 g/dL (ref 31.5–35.7)
MCV: 90 fL (ref 79–97)
Monocytes Absolute: 0.5 10*3/uL (ref 0.1–0.9)
Monocytes: 6 %
Neutrophils Absolute: 6 10*3/uL (ref 1.4–7.0)
Neutrophils: 70 %
Platelets: 239 10*3/uL (ref 150–450)
RBC: 4.47 x10E6/uL (ref 3.77–5.28)
RDW: 14.2 % (ref 11.7–15.4)
WBC: 8.6 10*3/uL (ref 3.4–10.8)

## 2024-01-17 LAB — CMP14+EGFR
ALT: 12 IU/L (ref 0–32)
AST: 22 IU/L (ref 0–40)
Albumin: 3.6 g/dL — ABNORMAL LOW (ref 3.9–4.9)
Alkaline Phosphatase: 107 IU/L (ref 44–121)
BUN/Creatinine Ratio: 17 (ref 12–28)
BUN: 18 mg/dL (ref 8–27)
Bilirubin Total: 0.3 mg/dL (ref 0.0–1.2)
CO2: 23 mmol/L (ref 20–29)
Calcium: 8.8 mg/dL (ref 8.7–10.3)
Chloride: 98 mmol/L (ref 96–106)
Creatinine, Ser: 1.04 mg/dL — ABNORMAL HIGH (ref 0.57–1.00)
Globulin, Total: 2.8 g/dL (ref 1.5–4.5)
Glucose: 193 mg/dL — ABNORMAL HIGH (ref 70–99)
Potassium: 4 mmol/L (ref 3.5–5.2)
Sodium: 140 mmol/L (ref 134–144)
Total Protein: 6.4 g/dL (ref 6.0–8.5)
eGFR: 58 mL/min/{1.73_m2} — ABNORMAL LOW (ref >59)

## 2024-01-17 MED ORDER — NYSTATIN 100000 UNIT/GM EX OINT: 1.0000 | TOPICAL_OINTMENT | Freq: Two times a day (BID) | CUTANEOUS | 4 refills | Status: DC

## 2024-01-17 NOTE — Telephone Encounter (Signed)
 Pharmacy Patient Advocate Encounter  Received notification from [facility] that Prior Authorization for Klayesta 100000 UNIT/GM powder has been DENIED.  Full denial letter will be uploaded to the media tab. See denial reason below.   PA #/Case ID/Reference #: [id number]

## 2024-01-17 NOTE — Telephone Encounter (Signed)
 Copied from CRM [id number]. Topic: Clinical - Prescription Issue >> Jan 17, 2024 12:52 PM Suzette B wrote: Reason for CRM: Nystatin Gel is needed instead of powder insurance is not covering the powder if you have any question [phone]

## 2024-01-17 NOTE — Telephone Encounter (Signed)
 Copied from CRM [id number]. Topic: Clinical - Medication Refill >> Jan 17, 2024 12:55 PM Hector Shade B wrote: Most Recent Primary Care Visit:  Provider: Jomarie Longs  Department: [facility]  Visit Type: OFFICE VISIT  Date: 01/16/2024  Medication: HYDROcodone-acetaminophen (NORCO) 10-325 MG tablet  Has the patient contacted their pharmacy? No (Agent: If no, request that the patient contact the pharmacy for the refill. If patient does not wish to contact the pharmacy document the reason why and proceed with request.) Pharmacy advised that the patient should call the provider for the refill request due to the type of medication.  (Agent: If yes, when and what did the pharmacy advise?)  Is this the correct pharmacy for this prescription? Yes If no, delete pharmacy and type the correct one.  This is the patient's preferred pharmacy:  [id number] - [place], [place] - [address] AT [facility] [address] [place] [address] Phone: [phone] Fax: [phone]  [id number] - [place], [place] - [address] [place] [address] Phone: [phone] Fax: [phone]  [facility] - [place], [place] - [address] [place] [address] Phone: [phone] Fax: [phone]   Has the prescription been filled recently? Yes 11/29/23  Is the patient out of the medication? Yes  Has the patient been seen for an appointment in the last year OR does the patient have an upcoming appointment? Yes  Can we respond through MyChart? Yes  Agent: Please be advised that Rx refills may take up to 3 business days. We ask that you follow-up with your pharmacy.

## 2024-01-18 LAB — URINE CULTURE

## 2024-01-18 NOTE — Telephone Encounter (Signed)
 Attempted call to patient. Left a voice mail message requesting a return call.

## 2024-01-19 ENCOUNTER — Encounter: Payer: Self-pay | Admitting: Physician Assistant

## 2024-01-19 MED ORDER — HYDROCODONE-ACETAMINOPHEN 10-325 MG PO TABS
1.0000 | ORAL_TABLET | Freq: Two times a day (BID) | ORAL | 0 refills | Status: DC
Start: 2024-01-19 — End: 2024-04-22

## 2024-01-19 NOTE — Progress Notes (Signed)
 No bacteria growth on urine culture. Stop antibiotic.

## 2024-01-22 ENCOUNTER — Encounter: Payer: Self-pay | Admitting: Physician Assistant

## 2024-01-22 NOTE — Telephone Encounter (Signed)
 Patient informed.

## 2024-02-24 ENCOUNTER — Other Ambulatory Visit: Payer: Self-pay | Admitting: Physician Assistant

## 2024-02-24 DIAGNOSIS — J310 Chronic rhinitis: Secondary | ICD-10-CM

## 2024-03-06 NOTE — Telephone Encounter (Signed)
 Copied from CRM [id number]. Topic: Clinical - Medication Prior Auth >> Mar 06, 2024  9:56 AM Kevelyn M wrote: Reason for CRM: [facility] and patient has [facility] and they require the requesting provider's office to handle pre-auth. Please call # [phone] once it's been completed.

## 2024-03-07 ENCOUNTER — Ambulatory Visit: Payer: Self-pay

## 2024-03-07 NOTE — Telephone Encounter (Signed)
 Copied from CRM [id number]. Topic: Clinical - Red Word Triage >> Mar 07, 2024  3:28 PM Brynn Caras wrote: Red Word that prompted transfer to Nurse Triage: Shelvy Dickens Daughter calling on behalf of the patient, states she is having delusions - mental confusion since last night.   Chief Complaint: Confusion  Symptoms: Confusion and some visual hallucinations  Frequency: Constant  Pertinent Negatives: Patient denies headache, fever  Disposition: [x] ED /[] Urgent Care (no appt availability in office) / [] Appointment(In office/virtual)/ []  [facility]/ [] Home Care/ [] Refused Recommended Disposition /[] [facility]/ []  Follow-up with PCP Additional Notes: Patient's daughter called stating that last night the patient began to experience increased confusion and some visual hallucinations. She states that the patient has been talking about seeing people who aren't there, blood on the walls, and states she wouldn't shower last night due to believing someone was watching her through the window. She states that the patient has had similar symptoms in the past with a UTI and is concerned she may have one now. I advised her that there are no appointments in the office until next week and that she should take the patient to the ED for evaluation. She states that she is unsure if the patient would agree to go due to a recent bad experience. I reinforced the importance of her being evaluated, and stated she should at least take her to urgent care to evaluate her urine. She verbalized understanding of this.      Reason for Disposition  Very strange or paranoid behavior  Answer Assessment - Initial Assessment Questions 1. LEVEL OF CONSCIOUSNESS: "How is he (she, the patient) acting right now?" (e.g., alert-oriented, confused, lethargic, stuporous, comatose)     Confusion  2. ONSET: "When did the confusion start?"  (minutes, hours, days)     Last night  3. PATTERN "Does this come and go, or has it been  constant since it started?"  "Is it present now?"     Constant  4. ALCOHOL  or DRUGS: "Has he been drinking alcohol  or taking any drugs?"      No 5. NARCOTIC MEDICINES: "Has he been receiving any narcotic medications?" (e.g., morphine , Vicodin)     No 6. CAUSE: "What do you think is causing the confusion?"      Last time she had this she had a UTI 7. OTHER SYMPTOMS: "Are there any other symptoms?" (e.g., difficulty breathing, headache, fever, weakness)     No  Protocols used: Confusion - Delirium-A-AH

## 2024-03-08 NOTE — Telephone Encounter (Signed)
 Spoke with patient's daughter. She will take her mother to be seen at ER- states her mother refused to go to ER yesterday but she will make sure that someone will see the patient today.

## 2024-03-08 NOTE — Telephone Encounter (Signed)
 Agree is in active delusions need to go to ED.

## 2024-03-09 ENCOUNTER — Encounter: Payer: Self-pay | Admitting: Emergency Medicine

## 2024-03-09 ENCOUNTER — Ambulatory Visit
Admission: EM | Admit: 2024-03-09 | Discharge: 2024-03-09 | Disposition: A | Discharge status: Home / Self Care | Attending: Emergency Medicine | Admitting: Emergency Medicine

## 2024-03-09 DIAGNOSIS — B372 Candidiasis of skin and nail: Secondary | ICD-10-CM | POA: Diagnosis not present

## 2024-03-09 DIAGNOSIS — N3000 Acute cystitis without hematuria: Secondary | ICD-10-CM | POA: Diagnosis not present

## 2024-03-09 LAB — POCT URINALYSIS DIP (MANUAL ENTRY)
Glucose, UA: NEGATIVE mg/dL
Nitrite, UA: NEGATIVE
Protein Ur, POC: 100 mg/dL — AB
Spec Grav, UA: >=1.03 — AB
Urobilinogen, UA: 0.2 U/dL
pH, UA: 5.5

## 2024-03-09 MED ORDER — NYSTATIN 100000 UNIT/GM EX POWD: 1.0000 | Freq: Three times a day (TID) | CUTANEOUS | 0 refills | Status: DC

## 2024-03-09 MED ORDER — FOSFOMYCIN TROMETHAMINE 3 G PO PACK: 3.0000 g | PACK | Freq: Once | ORAL | 0 refills | Status: AC

## 2024-03-09 NOTE — ED Provider Notes (Signed)
 [name]    CSN: [id number] Arrival date & time: 03/09/24  1340      History   Chief Complaint Chief Complaint  Patient presents with   Possible UTI    HPI [name] Rice is a 68 y.o. female.   Patient brought into clinic by daughter over concerns of dysuria, urinary urgency and a rash on her buttocks for the past week.  Beefy red rash in between the folds of her overlapping skin, insurance has denied nystatin  powder in the past.  Has not had any flank pain, nausea, vomiting or fevers.  Denies hematuria.  Patient has a history of frequent urinary tract infections recently.  On 2/25 she was diagnosed with a UTI and started on Augmentin .  On 3/18 she was seen at a local emergency department for UTI, altered mental status, hallucinations and chronic kidney disease.  Left without completing treatment.  On 4/1 she was seen at her primary care provider and started on Macrobid  for a UTI and diagnosed with acute kidney injury.  On 5/22 daughter reached out with concerns and was advised to take mother to the emergency department.  Patient was not agreeable at the time, so they presented to [facility] clinic today.  Patient with previous medical history of type 2 diabetes, COPD, hyperlipidemia, hypertension, morbid obesity, GERD, C. difficile, degenerative disc disease, multiple other comorbidities.  The history is provided by the patient and medical records.    Past Medical History:  Diagnosis Date   Allergy    Always allergic to cut grass   Anxiety    Arthritis    Asthma    no PFTs   Carpal tunnel syndrome, bilateral    Cataract 2020   Removed   Cervical dystonia    COPD (chronic obstructive pulmonary disease) (HCC)    no PFTs   Depression    Diabetes mellitus    Diarrhea    Diverticulitis    Diverticulosis    Dizziness    Gastroparesis    GERD (gastroesophageal reflux disease)    History of colonic polyps    hyperplastic   History of kidney stones    History of  seborrheic dermatitis    face,ears   HLD (hyperlipidemia)    Hypertension    Intussusception (HCC)    Lisfranc's sprain, left, initial encounter 10/06/2017   Lumbar pain 2011   per x-ray - Multilevel spondylosis   Morbid obesity (HCC)    Nephrolithiasis    OSA on CPAP 2008   Osteoporosis 2018   Pneumonia    Recurrent boils    "SLUSTER BOILS" ON ABD AND LABIA   Sleep apnea    USES C-PAP   Torn rotator cuff RT SHOULDER   Tremor CCENTRAL NERVOUS SYSTEM    Patient Active Problem List   Diagnosis Date Noted   At high risk for falls 01/16/2024   Dysuria 12/12/2023   Hypotension 12/12/2023   S/P cholecystectomy 10/20/2023   Rupture of operation wound 10/20/2023   Acute cystitis with hematuria 10/09/2023   Leukocytosis 10/09/2023   AKI (acute kidney injury) (HCC) 10/03/2023   Syncope and collapse 10/03/2023   OSA (obstructive sleep apnea) 05/15/2023   Chronic rhinitis 05/15/2023   Class 3 severe obesity due to excess calories with serious comorbidity and body mass index (BMI) of 40.0 to 44.9 in adult 05/15/2023   Seasonal allergic rhinitis due to pollen 05/15/2023   Essential hypertension 05/15/2023   Cellulitis 01/06/2023   Fungal dermatitis 01/06/2023   Tubular  adenoma of colon 09/16/2022   Clostridioides difficile infection 09/05/2022   Fistula of sigmoid colon 09/05/2022   Acute left-sided low back pain with left-sided sciatica 09/05/2022   Bilateral leg pain 07/27/2022   Itching 07/27/2022   Diminished pulses in lower extremity 04/12/2022   Blood clot in eye 12/31/2021   Osteopenia 12/30/2021   Partial nontraumatic tear of left rotator cuff 08/16/2021   Rosacea 04/02/2021   Chronic right shoulder pain 04/02/2021   Problems with swallowing 03/22/2021   Allergic conjunctivitis of both eyes 03/22/2021   Left ventricular hypertrophy 07/23/2020   Moderate episode of recurrent major depressive disorder (HCC) 07/14/2020   Paresthesia 06/30/2020   Weakness 06/30/2020    Chronic fatigue 06/30/2020   Bilateral rotator cuff syndrome 01/24/2020   DDD (degenerative disc disease), cervical 12/27/2019   Trigger finger, right middle finger 12/24/2019   Trigger index finger of right hand 12/24/2019   Chronic kidney disease (CKD), stage III (moderate) (HCC) 10/04/2019   Acute right ankle pain 09/25/2019   OAB (overactive bladder) 09/25/2019   Type 2 diabetes mellitus with diabetic neuropathy, with long-term current use of insulin  (HCC) 06/20/2019   Dizziness 06/20/2019   Bilateral carpal tunnel syndrome post carpal tunnel release on the left 01/24/2019   Pressure injury of right foot, unstageable (HCC) 09/25/2018   Calcification and ossification of muscle 07/13/2018   Drug-induced constipation 05/23/2018   Restrictive lung disease 04/12/2018   Mixed hyperlipidemia 04/12/2018   Vitamin D  insufficiency 04/12/2018   Hordeolum externum of right lower eyelid 02/16/2018   Hot flashes 02/16/2018   Ear itching 01/17/2018   No energy 01/17/2018   Vasomotor rhinitis 11/28/2017   Lisfranc's dislocation, right, initial encounter 10/06/2017   Freiberg's disease, right 10/06/2017   Neck pain 07/11/2017   Dystonia 09/21/2016   Acute reaction to stress 03/28/2016   Post-menopausal 03/16/2016   Intertrigo 03/15/2016   Left knee pain 03/15/2016   Morbid obesity due to excess calories (HCC) 02/16/2016   Unstable balance 02/16/2016   Chronic pain syndrome 12/15/2015   Non-proliferative diabetic retinopathy, right eye (HCC) 11/23/2015   Diarrhea 01/12/2015   Ear pain 12/09/2014   Type 2 diabetes, controlled, with peripheral neuropathy (HCC) 12/09/2014   Gastroparesis due to secondary diabetes (HCC) 09/16/2014   Diabetic peripheral neuropathy (HCC) 05/30/2014   Diverticulitis 11/27/2013   Hepatic steatosis 09/26/2013   SOB (shortness of breath) on exertion 03/31/2013   Intestinal bacterial overgrowth 09/20/2012   Lipoma of abdominal wall 05/01/2012   Iron deficiency  anemia 03/29/2012   History of allergic rhinitis 09/07/2011   Preventative health care 08/16/2011   Insomnia 02/06/2011   OSA on CPAP    Allergic sinusitis 05/27/2009   Depression with anxiety 01/28/2008   Acute bilateral low back pain 11/27/2007   Type II or unspecified type diabetes mellitus with neurological manifestations, not stated as uncontrolled(250.60) 08/08/2006   Hyperlipidemia LDL goal <70 08/08/2006   Hypertension associated with diabetes (HCC) 08/08/2006   GERD 08/08/2006    Past Surgical History:  Procedure Laterality Date   CARPAL TUNNEL RELEASE Left 2006   CHOLECYSTECTOMY  10/12/2023   ELBOW SURGERY Left    EYE SURGERY  2020   HAND SURGERY Right RIGHT   KNEE ARTHROSCOPY Left    LUMBAR LAMINECTOMY     MASS EXCISION  06/28/2012   Procedure: EXCISION MASS;  Surgeon: Rogena Class, MD;  Location: [facility] OR;  Service: General;  Laterality: Left;  excision left upper quadrant abdominal wall mass, Benign Lipoma  NASAL SINUS SURGERY     PILONIDAL CYST EXCISION  [id number]   SUBACROMIAL DECOMPRESSION Right    TUBAL LIGATION      OB History   No obstetric history on file.      Home Medications    Prior to Admission medications   Medication Sig Start Date End Date Taking? Authorizing Provider  Alcohol  Swabs (B-D SINGLE USE SWABS REGULAR) PADS 1 Device by Does not apply route 4 (four) times daily as needed. 06/16/21  Yes Breeback, Jade L, PA-C  AMBULATORY NON FORMULARY MEDICATION Walk in shower needed in apartment due to mobility issues and high fall risk. 01/16/24  Yes Breeback, Jade L, PA-C  azelastine  (OPTIVAR ) 0.05 % ophthalmic solution INSTILL 1 DROP INTO BOTH EYES TWICE DAILY 08/31/21  Yes Breeback, Jade L, PA-C  Blood Glucose Calibration (ACCU-CHEK GUIDE CONTROL) LIQD 1 each by In Vitro route as directed. 11/01/23  Yes Breeback, Jade L, PA-C  Blood Glucose Monitoring Suppl (ACCU-CHEK GUIDE ME) w/Device KIT 1 each by Does not apply route as directed. 11/07/23  Yes  Breeback, Jade L, PA-C  Cholecalciferol  (VITAMIN D ) 50 MCG (2000 UT) CAPS Take 2,000 Units by mouth daily.   Yes [provider]  dicyclomine  (BENTYL ) 20 MG tablet Take 1 tablet (20 mg total) by mouth 2 (two) times daily as needed for spasms. 08/04/23  Yes Leonides Ramp, FNP  DROPLET PEN NEEDLES 32G X 6 MM MISC USE THREE TIMES DAILY  WITH  NOVOLOG  06/08/21  Yes Breeback, Jade L, PA-C  DULoxetine  (CYMBALTA ) 60 MG capsule Take 2 capsules (120 mg total) by mouth daily. 08/09/23  Yes Breeback, Jade L, PA-C  famotidine  (PEPCID ) 20 MG tablet TAKE 1 TABLET BY MOUTH TWICE A DAY 08/04/23  Yes Breeback, Jade L, PA-C  fluticasone  (FLONASE ) 50 MCG/ACT nasal spray SHAKE LIQUID AND USE 2 SPRAYS IN EACH NOSTRIL DAILY 02/26/24  Yes Breeback, Jade L, PA-C  fosfomycin (MONUROL) 3 g PACK Take 3 g by mouth once for 1 dose. 03/09/24 03/09/24 Yes Cherie Lasalle  N, FNP  furosemide  (LASIX ) 20 MG tablet TAKE 1 TABLET TWICE DAILY 10/03/23  Yes Breeback, Jade L, PA-C  gabapentin  (NEURONTIN ) 600 MG tablet TAKE 1 TABLET BY MOUTH THREE TIMES DAILY 12/25/23  Yes Breeback, Jade L, PA-C  gabapentin  (NEURONTIN ) 800 MG tablet Take 1 tablet (800 mg total) by mouth 3 (three) times daily. 05/08/23  Yes Breeback, Jade L, PA-C  HYDROcodone -acetaminophen  (NORCO) 10-325 MG tablet Take 1 tablet by mouth every 12 (twelve) hours. 01/19/24  Yes Breeback, Jade L, PA-C  hydrOXYzine  (ATARAX ) 50 MG tablet Take 1 tablet (50 mg total) by mouth every 8 (eight) hours as needed. For itching. 07/13/22  Yes Breeback, Jade L, PA-C  insulin  glargine, 2 Unit Dial, (TOUJEO  MAX SOLOSTAR) 300 UNIT/ML Solostar Pen Inject 40 Units into the skin at bedtime. 11/04/22  Yes Breeback, Jade L, PA-C  ipratropium (ATROVENT ) 0.06 % nasal spray Place 2 sprays into both nostrils 4 (four) times daily. 05/08/23  Yes Breeback, Jade L, PA-C  levocetirizine (XYZAL ) 5 MG tablet TAKE 1 TABLET BY MOUTH EVERY EVENING 08/07/23  Yes Breeback, Jade L, PA-C  losartan  (COZAAR ) 25 MG  tablet TAKE 1 TABLET EVERY DAY 08/04/23  Yes Breeback, Jade L, PA-C  meclizine  (ANTIVERT ) 25 MG tablet Take 1 tablet (25 mg total) by mouth 3 (three) times daily as needed for dizziness. 02/03/22  Yes Breeback, Jade L, PA-C  metoCLOPramide  (REGLAN ) 10 MG tablet Take 0.5 tablets (5 mg total) by mouth 3 (  three) times daily before meals. 12/05/23  Yes Breeback, Jade L, PA-C  mirabegron  ER (MYRBETRIQ ) 25 MG TB24 tablet TAKE 1 TABLET BY MOUTH DAILY 07/25/23  Yes Breeback, Jade L, PA-C  Misc. Devices [facility] CUSHION) MISC 1 Device by Does not apply route daily. 08/30/22  Yes Breeback, Jade L, PA-C  montelukast  (SINGULAIR ) 10 MG tablet TAKE 1 TABLET AT BEDTIME 08/04/23  Yes Breeback, Jade L, PA-C  nitrofurantoin , macrocrystal-monohydrate, (MACROBID ) 100 MG capsule Take 1 capsule (100 mg total) by mouth 2 (two) times daily. 01/16/24  Yes Breeback, Jade L, PA-C  nystatin  (MYCOSTATIN /NYSTOP ) powder Apply 1 Application topically 3 (three) times daily. 01/16/24  Yes Breeback, Jade L, PA-C  nystatin  (MYCOSTATIN /NYSTOP ) powder Apply 1 Application topically 3 (three) times daily. 03/09/24  Yes Mava Suares  N, FNP  nystatin  ointment (MYCOSTATIN ) Apply 1 Application topically 2 (two) times daily. 01/17/24  Yes Breeback, Jade L, PA-C  nystatin -triamcinolone  ointment (MYCOLOG) Apply 1 Application topically 2 (two) times daily. 11/07/23  Yes Breeback, Jade L, PA-C  pantoprazole  (PROTONIX ) 40 MG tablet TAKE 1 TABLET BY MOUTH DAILY 02/15/23  Yes Breeback, Jade L, PA-C  pioglitazone  (ACTOS ) 30 MG tablet TAKE 1 TABLET EVERY DAY 05/08/23  Yes Breeback, Jade L, PA-C  pravastatin  (PRAVACHOL ) 10 MG tablet Take 1 tablet (10 mg total) by mouth daily. 04/12/22  Yes Breeback, Jade L, PA-C  propranolol  (INDERAL ) 20 MG tablet TAKE 1TABLET BY MOUTH TWICE DAILY 05/08/23  Yes Breeback, Jade L, PA-C  Semaglutide , 1 MG/DOSE, (OZEMPIC , 1 MG/DOSE,) 4 MG/3ML SOPN INJECT 1MG  INTO THE SKIN ONCE A WEEK 12/05/23  Yes Breeback, Jade L, PA-C  vitamin B-12  (CYANOCOBALAMIN ) 1000 MCG tablet Take 1 tablet (1,000 mcg total) by mouth daily. 04/12/22  Yes Araceli Knight, PA-C    Family History Family History  Problem Relation Age of Onset   Diabetes Mother    Hyperlipidemia Mother    Hypertension Mother    Kidney disease Mother    Depression Sister    Diabetes Sister    Hyperlipidemia Sister    Hypertension Sister    Stroke Sister    Vision loss Sister    Heart disease Brother    Heart attack Brother    Depression Brother    Diabetes Brother    Hypertension Brother    Hyperlipidemia Brother    Heart attack Brother    Depression Brother    Diabetes Brother    Hyperlipidemia Brother    Hypertension Brother    Kidney cancer Brother    Cancer Neg Hx     Social History Social History   Tobacco Use   Smoking status: Former    Current packs/day: 0.00    Average packs/day: 0.5 packs/day for 30.0 years (15.0 ttl pk-yrs)    Types: Cigarettes    Start date: 10/17/1966    Quit date: 10/17/1996    Years since quitting: 27.4   Smokeless tobacco: Never  Vaping Use   Vaping status: Never Used  Substance Use Topics   Alcohol  use: No   Drug use: No     Allergies   Ace inhibitors, Atrovent  [ipratropium], Cefaclor, Glyxambi  [empagliflozin -linagliptin ], Shrimp [shellfish allergy], Farxiga  [dapagliflozin ], Metformin  and related, Wellbutrin  [bupropion ], Ceclor [cefaclor], Sulfa antibiotics, and Sulfonamide derivatives   Review of Systems Review of Systems  Per HPI  Physical Exam Triage Vital Signs ED Triage Vitals [03/09/24 1354]  Encounter Vitals Group     BP 137/81     Systolic BP Percentile      Diastolic BP  Percentile      Pulse Rate (!) 108     Resp 18     Temp 98.1 F (36.7 C)     Temp Source Oral     SpO2 94 %     Weight      Height      Head Circumference      Peak Flow      Pain Score      Pain Loc      Pain Education      Exclude from Growth Chart    No data found.  Updated Vital Signs BP 137/81 (BP  Location: Right Arm)   Pulse (!) 108   Temp 98.1 F (36.7 C) (Oral)   Resp 18   LMP 10/18/1995   SpO2 94%   Visual Acuity Right Eye Distance:   Left Eye Distance:   Bilateral Distance:    Right Eye Near:   Left Eye Near:    Bilateral Near:     Physical Exam Vitals and nursing note reviewed.  Constitutional:      Appearance: Normal appearance.  HENT:     Head: Normocephalic and atraumatic.     Right Ear: External ear normal.     Left Ear: External ear normal.     Nose: Nose normal.     Mouth/Throat:     Mouth: Mucous membranes are moist.  Eyes:     Conjunctiva/sclera: Conjunctivae normal.  Cardiovascular:     Rate and Rhythm: Normal rate.  Abdominal:     Tenderness: There is no right CVA tenderness or left CVA tenderness.  Skin:    General: Skin is warm and dry.  Neurological:     General: No focal deficit present.     Mental Status: She is alert.  Psychiatric:        Mood and Affect: Mood normal.        Behavior: Behavior is cooperative.      UC Treatments / Results  Labs (all labs ordered are listed, but only abnormal results are displayed) Labs Reviewed  POCT URINALYSIS DIP (MANUAL ENTRY) - Abnormal; Notable for the following components:      Result Value   Color, UA other (*)    Clarity, UA cloudy (*)    Bilirubin, UA small (*)    Ketones, POC UA trace (5) (*)    Spec Grav, UA >=1.030 (*)    Blood, UA trace-intact (*)    Protein Ur, POC =100 (*)    Leukocytes, UA Small (1+) (*)    All other components within normal limits  URINE CULTURE    EKG   Radiology No results found.  Procedures Procedures (including critical care time)  Medications Ordered in UC Medications - No data to display  Initial Impression / Assessment and Plan / UC Course  I have reviewed the triage vital signs and the nursing notes.  Pertinent labs & imaging results that were available during my care of the patient were reviewed by me and considered in my medical  decision making (see chart for details).  Vitals in triage reviewed, patient is hemodynamically stable.  Urinalysis concerning for acute UTI, will send for culture.  Negative for CVA tenderness, without nausea or vomiting.  Slightly tachycardic.  Lower concern for pyelonephritis at this time.  Will try fosfomycin as patient has failed Macrobid  and Augmentin  in the past.  History of reoccurring UTIs.  Hallucinations, none currently, GCS 15.   Beefy red rash in between abdominal  folds in the groin area consistent with candidiasis.  Will treat with nystatin  topical powder.  Plan of care, follow-up care and strict emergency precautions given if symptoms evolve, no questions at this time.     Final Clinical Impressions(s) / UC Diagnoses   Final diagnoses:  Acute cystitis without hematuria  Candidiasis of skin     Discharge Instructions      You appear to have another urinary tract infection.  We are treating it with a one-time dose of fosfomycin.  Your insurance may not cover this antibiotic, [email or web address] has coupons available for this and it should be around $35.  Other antibiotic options you are either allergic to or have tried and failed.  You can use GoodRx for the nystatin  powder as well.  Follow-up with your primary care provider regarding further medication management.  Seek immediate care at the nearest emergency department if you develop further hallucinations, fever, vomiting, or new concerning symptoms.    ED Prescriptions     Medication Sig Dispense Auth. Provider   nystatin  (MYCOSTATIN /NYSTOP ) powder Apply 1 Application topically 3 (three) times daily. 60 g Harlow Lighter, Josiane Labine  N, FNP   fosfomycin (MONUROL) 3 g PACK Take 3 g by mouth once for 1 dose. 3 g Sonnie Dusky, FNP      PDMP not reviewed this encounter.   Harlow Lighter, Kanitra Purifoy  N, FNP 03/09/24 1430

## 2024-03-09 NOTE — ED Triage Notes (Signed)
 Patient c/o dysuria, rash on bottom, urinary urgency x 1 week.  History of UTI's.  Denies any hematuria.

## 2024-03-09 NOTE — Discharge Instructions (Addendum)
 You appear to have another urinary tract infection.  We are treating it with a one-time dose of fosfomycin.  Your insurance may not cover this antibiotic, [email or web address] has coupons available for this and it should be around $35.  Other antibiotic options you are either allergic to or have tried and failed.  You can use [employer] for the nystatin  powder as well.  Follow-up with your primary care provider regarding further medication management.  Seek immediate care at the nearest emergency department if you develop further hallucinations, fever, vomiting, or new concerning symptoms.

## 2024-03-10 LAB — URINE CULTURE: Culture: 20000 — AB

## 2024-03-11 ENCOUNTER — Ambulatory Visit (HOSPITAL_COMMUNITY): Payer: Self-pay

## 2024-04-08 ENCOUNTER — Other Ambulatory Visit: Payer: Self-pay | Admitting: Physician Assistant

## 2024-04-08 DIAGNOSIS — Z1231 Encounter for screening mammogram for malignant neoplasm of breast: Secondary | ICD-10-CM

## 2024-04-22 ENCOUNTER — Ambulatory Visit (INDEPENDENT_AMBULATORY_CARE_PROVIDER_SITE_OTHER): Admitting: Physician Assistant

## 2024-04-22 ENCOUNTER — Encounter: Payer: Self-pay | Admitting: Physician Assistant

## 2024-04-22 VITALS — BP 109/50 | HR 72 | Ht 63.0 in | Wt 226.0 lb

## 2024-04-22 DIAGNOSIS — F332 Major depressive disorder, recurrent severe without psychotic features: Secondary | ICD-10-CM

## 2024-04-22 DIAGNOSIS — E1140 Type 2 diabetes mellitus with diabetic neuropathy, unspecified: Secondary | ICD-10-CM

## 2024-04-22 DIAGNOSIS — N390 Urinary tract infection, site not specified: Secondary | ICD-10-CM | POA: Diagnosis not present

## 2024-04-22 DIAGNOSIS — R0602 Shortness of breath: Secondary | ICD-10-CM

## 2024-04-22 DIAGNOSIS — I10 Essential (primary) hypertension: Secondary | ICD-10-CM

## 2024-04-22 DIAGNOSIS — E1142 Type 2 diabetes mellitus with diabetic polyneuropathy: Secondary | ICD-10-CM

## 2024-04-22 DIAGNOSIS — G894 Chronic pain syndrome: Secondary | ICD-10-CM | POA: Diagnosis not present

## 2024-04-22 DIAGNOSIS — Z794 Long term (current) use of insulin: Secondary | ICD-10-CM | POA: Diagnosis not present

## 2024-04-22 LAB — POCT URINALYSIS DIP (CLINITEK)
Bilirubin, UA: NEGATIVE
Blood, UA: NEGATIVE
Glucose, UA: 100 mg/dL — AB
Ketones, POC UA: NEGATIVE mg/dL
Nitrite, UA: NEGATIVE
Spec Grav, UA: >=1.03 — AB (ref 1.010–1.025)
Urobilinogen, UA: 1 U/dL
pH, UA: 5.5 (ref 5.0–8.0)

## 2024-04-22 LAB — POCT GLYCOSYLATED HEMOGLOBIN (HGB A1C): Hemoglobin A1C: 6.4 % — AB (ref 4.0–5.6)

## 2024-04-22 MED ORDER — PROPRANOLOL HCL 20 MG PO TABS: ORAL_TABLET | ORAL | 1 refills | Status: AC

## 2024-04-22 MED ORDER — PIOGLITAZONE HCL 30 MG PO TABS: ORAL_TABLET | ORAL | 3 refills | Status: AC

## 2024-04-22 MED ORDER — ACCU-CHEK SOFTCLIX LANCETS MISC: 12 refills | Status: DC

## 2024-04-22 MED ORDER — REXULTI 0.5 MG PO TABS: ORAL_TABLET | ORAL | 2 refills | Status: DC

## 2024-04-22 MED ORDER — DULOXETINE HCL 60 MG PO CPEP: 120.0000 mg | ORAL_CAPSULE | Freq: Every day | ORAL | 1 refills | Status: AC

## 2024-04-22 MED ORDER — HYDROCODONE-ACETAMINOPHEN 10-325 MG PO TABS: 1.0000 | ORAL_TABLET | Freq: Two times a day (BID) | ORAL | 0 refills | Status: DC

## 2024-04-22 MED ORDER — FUROSEMIDE 20 MG PO TABS: ORAL_TABLET | ORAL | 1 refills | Status: AC

## 2024-04-22 MED ORDER — FREESTYLE LIBRE 3 PLUS SENSOR MISC: 2 refills | Status: DC

## 2024-04-22 MED ORDER — OZEMPIC (1 MG/DOSE) 4 MG/3ML ~~LOC~~ SOPN: PEN_INJECTOR | SUBCUTANEOUS | 2 refills | Status: DC

## 2024-04-22 NOTE — Patient Instructions (Addendum)
 Referral placed for eye doctor downstairs Trial of libre 3 plus to monitor sugars Will get UA in office today Added rexulti  to medication for depression

## 2024-04-22 NOTE — Progress Notes (Unsigned)
 Established Patient Office Visit  Subjective   Patient ID: [name] Rice, female    DOB: 1956/07/22  Age: 68 y.o. MRN: [id number]  Chief Complaint  Patient presents with   Medical Management of Chronic Issues    Last A1c 7.1    HPI Pt is a 68 yo obese female who is non-ambulatory in a wheelchair who presents to the clinic for follow up on chronic pain and T2DM.   Pt is not checking her sugars regularly as she should because her fingers hurt. She does not have a CGM. When she checks them in the morning range 100-140s. She denies any recent hypoglycemic events. She has been depressed and sleeping a lot. When she sleeps she does not take toujeo . She has not really had appetite and lost 76lbs. She denies any CP, palpitations, headaches or vision changes. Her last A1c was 7.1. she is taking ozempic  weekly and actos  daily.   She has chronic pain due to DDD/OA and takes norco most days at least once a day. She needs refill. Last refill was 01/20/2024.   She has recently had UTIs that caused hallucinations and hospital visits. No urinary symptoms today but would like her urine checked.    ROS See HPI.    Objective:     BP (!) 109/50   Pulse 72   Ht 5' 3 (1.6 m)   Wt 226 lb (102.5 kg)   LMP 10/18/1995   SpO2 100%   BMI 40.03 kg/m  BP Readings from Last 3 Encounters:  04/22/24 (!) 109/50  03/09/24 137/81  01/16/24 (!) 138/45   Wt Readings from Last 3 Encounters:  04/22/24 226 lb (102.5 kg)  01/16/24 226 lb 12 oz (102.9 kg)  12/12/23 226 lb (102.5 kg)      Physical Exam Constitutional:      Appearance: Normal appearance. She is obese.  HENT:     Head: Normocephalic.  Cardiovascular:     Rate and Rhythm: Normal rate and regular rhythm.  Pulmonary:     Effort: Pulmonary effort is normal.     Breath sounds: Normal breath sounds.  Musculoskeletal:     Right lower leg: Edema present.     Left lower leg: Edema present.  Neurological:     General: No focal deficit  present.     Mental Status: She is alert and oriented to person, place, and time.  Psychiatric:        Mood and Affect: Mood normal.      Results for orders placed or performed in visit on 04/22/24  Drug Profile, Ur, 9 Drugs  Result Value Ref Range   Amphetamines, Urine Negative Cutoff=1000 ng/mL   Barbiturate Quant, Ur Negative Cutoff=300 ng/mL   Benzodiazepine Quant, Ur Negative Cutoff=300 ng/mL   Cannabinoid Quant, Ur Negative Cutoff=50 ng/mL   Cocaine (Metab.) Negative Cutoff=300 ng/mL   OPIATE SCREEN URINE Negative Cutoff=300 ng/mL   PCP Quant, Ur Negative Cutoff=25 ng/mL   Methadone Screen, Urine Negative Cutoff=300 ng/mL   Propoxyphene Negative Cutoff=300 ng/mL   Creatinine, Urine 150.5 20.0 - 300.0 mg/dL   Nitrite Urine, Quantitative Negative Cutoff=200 mcg/mL   pH, Urine 5.2 4.5 - 8.9  POCT HgB A1C  Result Value Ref Range   Hemoglobin A1C 6.4 (A) 4.0 - 5.6 %   HbA1c POC (<> result, manual entry)     HbA1c, POC (prediabetic range)     HbA1c, POC (controlled diabetic range)    POCT URINALYSIS DIP (CLINITEK)  Result Value Ref  Range   Color, UA yellow yellow   Clarity, UA cloudy (A) clear   Glucose, UA =100 (A) negative mg/dL   Bilirubin, UA negative negative   Ketones, POC UA negative negative mg/dL   Spec Grav, UA >=8.969 (A) 1.010 - 1.025   Blood, UA negative negative   pH, UA 5.5 5.0 - 8.0   POC PROTEIN,UA trace negative, trace   Urobilinogen, UA 1.0 0.2 or 1.0 E.U./dL   Nitrite, UA Negative Negative   Leukocytes, UA Small (1+) (A) Negative   ..    04/23/2024    4:28 PM 12/05/2023    9:29 AM 05/08/2023   11:27 AM 12/02/2022    9:41 AM 11/04/2022    9:54 AM  Depression screen PHQ 2/9  Decreased Interest 2 3 3 3 2   Down, Depressed, Hopeless 2 2 2  0 1  PHQ - 2 Score 4 5 5 3 3   Altered sleeping 2 3 3 3 3   Tired, decreased energy 3 2  2 2   Change in appetite 2 1 3 2 3   Feeling bad or failure about yourself  3  0 1 1  Trouble concentrating 2 1 2 1 2   Moving  slowly or fidgety/restless 2 0 0 0 0  Suicidal thoughts 0 0 2 0 0  PHQ-9 Score 18 12 15 12 14   Difficult doing work/chores Very difficult Somewhat difficult Not difficult at all Somewhat difficult Somewhat difficult   ..    04/23/2024    4:28 PM 05/08/2023   11:42 AM 11/04/2022    9:54 AM 07/13/2022    9:01 AM  GAD 7 : Generalized Anxiety Score  Nervous, Anxious, on Edge 1 0 0 1  Control/stop worrying 1 3 1 1   Worry too much - different things 1 3 2 2   Trouble relaxing 1 3 0 1  Restless 1 1 0 0  Easily annoyed or irritable 1 2 2  0  Afraid - awful might happen 1 2 0 0  Total GAD 7 Score 7 14 5 5   Anxiety Difficulty Somewhat difficult Very difficult Very difficult Somewhat difficult      The 10-year ASCVD risk score (Arnett DK, et al., 2019) is: 17%    Assessment & Plan:  [name] Rice was seen today for medical management of chronic issues.  Diagnoses and all orders for this visit:  Chronic pain syndrome -     HYDROcodone -acetaminophen  (NORCO) 10-325 MG tablet; Take 1 tablet by mouth every 12 (twelve) hours. As needed for moderate or severe pain. -     Drug Profile, Ur, 9 Drugs  Type 2 diabetes mellitus with diabetic neuropathy, with long-term current use of insulin  (HCC) -     POCT HgB A1C -     Accu-Chek Softclix Lancets lancets; Use as needed for glucose check due to T2DM  up to 4 times a day. -     Ambulatory referral to Ophthalmology -     Continuous Glucose Sensor (FREESTYLE LIBRE 3 PLUS SENSOR) MISC; Change sensor every 15 days. -     Semaglutide , 1 MG/DOSE, (OZEMPIC , 1 MG/DOSE,) 4 MG/3ML SOPN; INJECT 1MG  INTO THE SKIN ONCE A WEEK  Recurrent UTI -     POCT URINALYSIS DIP (CLINITEK) -     Urine Culture  SOB (shortness of breath) on exertion -     furosemide  (LASIX ) 20 MG tablet; TAKE 1 TABLET TWICE DAILY  Diabetic peripheral neuropathy (HCC) -     DULoxetine  (CYMBALTA ) 60 MG capsule; Take 2  capsules (120 mg total) by mouth daily. -     pioglitazone  (ACTOS ) 30 MG  tablet; TAKE 1 TABLET EVERY DAY  Essential hypertension -     propranolol  (INDERAL ) 20 MG tablet; TAKE 1TABLET BY MOUTH TWICE DAILY  Severe episode of recurrent major depressive disorder, without psychotic features (HCC) -     Brexpiprazole  (REXULTI ) 0.5 MG TABS; Take one tablet daily for depression.   A1C to goal and improved since last recheck Continue on same medications(ozempic /actos /toujeo ) I would like for patient to have CGM, libre sent to pharmacy.  BP to goal, on ARB On statin Referral for eye exam UTD foot exam Vaccines UTD Follow up in 3 months  Mood not controlled On cymbalta  for pain and mood Added rexulti  for adjunct care for depression  Pain contract signed today UDS ordered today .SABRAPDMP reviewed during this encounter. No concerns Refilled norco Follow up in 3 months  UA positive for leuks/glucose Will culture to confirm no bacteria Discussed UTI prevention and signs of infection    Return in about 6 weeks (around 06/03/2024) for mood.    [name] Monda, PA-C

## 2024-04-23 ENCOUNTER — Encounter: Payer: Self-pay | Admitting: Physician Assistant

## 2024-04-23 DIAGNOSIS — F332 Major depressive disorder, recurrent severe without psychotic features: Secondary | ICD-10-CM | POA: Insufficient documentation

## 2024-04-23 LAB — DRUG PROFILE, UR, 9 DRUGS (LABCORP)
Amphetamines, Urine: NEGATIVE ng/mL
Barbiturate Quant, Ur: NEGATIVE ng/mL
Benzodiazepine Quant, Ur: NEGATIVE ng/mL
Cannabinoid Quant, Ur: NEGATIVE ng/mL
Cocaine (Metab.): NEGATIVE ng/mL
Creatinine, Urine: 150.5 mg/dL (ref 20.0–300.0)
Methadone Screen, Urine: NEGATIVE ng/mL
Nitrite Urine, Quantitative: NEGATIVE ug/mL
OPIATE SCREEN URINE: NEGATIVE ng/mL
PCP Quant, Ur: NEGATIVE ng/mL
Propoxyphene: NEGATIVE ng/mL
pH, Urine: 5.2 (ref 4.5–8.9)

## 2024-04-24 ENCOUNTER — Ambulatory Visit: Payer: Self-pay | Admitting: Physician Assistant

## 2024-04-24 LAB — URINE CULTURE

## 2024-04-24 NOTE — Progress Notes (Signed)
 No significant bacteria found in urine. Great news.

## 2024-04-25 ENCOUNTER — Other Ambulatory Visit (HOSPITAL_COMMUNITY): Payer: Self-pay

## 2024-04-25 ENCOUNTER — Telehealth: Payer: Self-pay

## 2024-04-25 NOTE — Telephone Encounter (Signed)
 Pharmacy Patient Advocate Encounter  Received notification from [employer] that Prior Authorization for Rexulti  0.5mg  has been DENIED.  See denial reason below. No denial letter attached in CMM. Will attach denial letter to Media tab once received.   PA #/Case ID/Reference #: [id number]

## 2024-04-25 NOTE — Telephone Encounter (Signed)
 Pharmacy Patient Advocate Encounter   Received notification from Onbase that prior authorization for Rexulti  0.5mg  tabs is required/requested.   Insurance verification completed.   The patient is insured through [place] .   Per test claim: PA required; PA submitted to above mentioned insurance via CoverMyMeds Key/confirmation #/EOC [id number] Status is pending

## 2024-04-26 MED ORDER — ARIPIPRAZOLE 5 MG PO TABS: 5.0000 mg | ORAL_TABLET | Freq: Every day | ORAL | 2 refills | Status: DC

## 2024-04-26 NOTE — Addendum Note (Signed)
 Addended by: ANTONIETTE VERMELL CROME on: 04/26/2024 03:14 PM   Modules accepted: Orders

## 2024-04-26 NOTE — Telephone Encounter (Signed)
 Left message for a return call

## 2024-04-26 NOTE — Telephone Encounter (Signed)
 Sent abilify  5mg  at bedtime to replace rexulti .

## 2024-04-26 NOTE — Telephone Encounter (Signed)
 [name] Rice agreed to try Abilify .

## 2024-04-26 NOTE — Telephone Encounter (Signed)
 Will need to try abilify  before rexulti . Are you ok with trying for depression?

## 2024-04-29 ENCOUNTER — Telehealth: Payer: Self-pay

## 2024-04-29 ENCOUNTER — Other Ambulatory Visit: Payer: Self-pay

## 2024-04-29 ENCOUNTER — Other Ambulatory Visit (HOSPITAL_COMMUNITY): Payer: Self-pay

## 2024-04-29 NOTE — Telephone Encounter (Signed)
 Pharmacy Patient Advocate Encounter   Received notification from Fax that prior authorization for Freestyle Libre 3 Plus sensor is required/requested.   Insurance verification completed.   The patient is insured through [place] .   Per test claim: PA required; PA submitted to above mentioned insurance via CoverMyMeds Key/confirmation #/EOC [id number] Status is pending

## 2024-04-29 NOTE — Telephone Encounter (Signed)
 Pharmacy Patient Advocate Encounter  Received notification from [employer] that Prior Authorization for Freestyle Libre 3 Plus sensors has been APPROVED from 10/18/23 to 10/16/24   PA #/Case ID/Reference #: [id number]   Left a message at [facility] to notify of the approval.

## 2024-04-29 NOTE — Telephone Encounter (Signed)
 Pharmacy aware - and informed that prescription for this was sent on 04/22/2024 .

## 2024-05-02 ENCOUNTER — Other Ambulatory Visit: Payer: Self-pay | Admitting: Physician Assistant

## 2024-05-02 DIAGNOSIS — G4733 Obstructive sleep apnea (adult) (pediatric): Secondary | ICD-10-CM

## 2024-05-02 DIAGNOSIS — Z6841 Body Mass Index (BMI) 40.0 and over, adult: Secondary | ICD-10-CM

## 2024-05-17 ENCOUNTER — Ambulatory Visit: Payer: Self-pay

## 2024-05-17 ENCOUNTER — Telehealth: Admitting: Physician Assistant

## 2024-05-17 ENCOUNTER — Telehealth: Payer: Self-pay

## 2024-05-17 DIAGNOSIS — H109 Unspecified conjunctivitis: Secondary | ICD-10-CM | POA: Diagnosis not present

## 2024-05-17 MED ORDER — MOXIFLOXACIN HCL 0.5 % OP SOLN
1.0000 [drp] | Freq: Three times a day (TID) | OPHTHALMIC | 0 refills | Status: AC
Start: 2024-05-17 — End: 2024-05-22

## 2024-05-17 NOTE — Progress Notes (Signed)
   05/17/2024  Patient ID: Rock LELON Boyer, female   DOB: 1955/11/07, 68 y.o.   MRN: [id number]  This patient is appearing on a report for being at risk of failing the adherence measure for diabetes medications this calendar year.   Medication: Ozempic  1mg  Last fill date: 04/25/2024 for 28 day supply  Insurance report was not up to date. No action needed at this time.   Channing DELENA Mealing, PharmD, DPLA

## 2024-05-17 NOTE — Telephone Encounter (Signed)
 Reason for Disposition  Blurred vision  Protocols used: Eye - Pus or Discharge-A-AH Pt reports bilateral eye pruritus with discharge.

## 2024-05-17 NOTE — Telephone Encounter (Signed)
 Patient chart shows scheduled for online telehealth visit schld for 4:30 today

## 2024-05-17 NOTE — Patient Instructions (Signed)
 [name] Rice, thank you for joining Delon CHRISTELLA Dickinson, PA-C for today's virtual visit.  While this provider is not your primary care provider (PCP), if your PCP is located in our provider database this encounter information will be shared with them immediately following your visit.   A [facility] MyChart account gives you access to today's visit and all your visits, tests, and labs performed at [facility]  click here if you don't have a [facility] MyChart account or go to [email or web address]  Consent: (Patient) [name] Rice provided verbal consent for this virtual visit at the beginning of the encounter.  Current Medications:  Current Outpatient Medications:    moxifloxacin  (VIGAMOX ) 0.5 % ophthalmic solution, Place 1 drop into both eyes 3 (three) times daily for 5 days., Disp: 3 mL, Rfl: 0   Accu-Chek Softclix Lancets lancets, Use as needed for glucose check due to T2DM  up to 4 times a day., Disp: 100 each, Rfl: 12   Alcohol  Swabs (B-D SINGLE USE SWABS REGULAR) PADS, 1 Device by Does not apply route 4 (four) times daily as needed., Disp: 100 each, Rfl: 1   AMBULATORY NON FORMULARY MEDICATION, Walk in shower needed in apartment due to mobility issues and high fall risk., Disp: 1 Device, Rfl: 0   ARIPiprazole  (ABILIFY ) 5 MG tablet, Take 1 tablet (5 mg total) by mouth at bedtime., Disp: 30 tablet, Rfl: 2   azelastine  (OPTIVAR ) 0.05 % ophthalmic solution, INSTILL 1 DROP INTO BOTH EYES TWICE DAILY, Disp: 24 mL, Rfl: 0   Blood Glucose Calibration (ACCU-CHEK GUIDE CONTROL) LIQD, 1 each by In Vitro route as directed., Disp: 1 each, Rfl: 3   Blood Glucose Monitoring Suppl (ACCU-CHEK GUIDE ME) w/Device KIT, 1 each by Does not apply route as directed., Disp: 1 kit, Rfl: 0   Brexpiprazole  (REXULTI ) 0.5 MG TABS, Take one tablet daily for depression., Disp: 30 tablet, Rfl: 2   Cholecalciferol  (VITAMIN D ) 50 MCG (2000 UT) CAPS, Take 2,000 Units by mouth daily., Disp: , Rfl:     Continuous Glucose Sensor (FREESTYLE LIBRE 3 PLUS SENSOR) MISC, Change sensor every 15 days., Disp: 2 each, Rfl: 2   dicyclomine  (BENTYL ) 20 MG tablet, Take 1 tablet (20 mg total) by mouth 2 (two) times daily as needed for spasms., Disp: 24 tablet, Rfl: 0   DROPLET PEN NEEDLES 32G X 6 MM MISC, USE THREE TIMES DAILY  WITH  NOVOLOG , Disp: 300 each, Rfl: 1   DULoxetine  (CYMBALTA ) 60 MG capsule, Take 2 capsules (120 mg total) by mouth daily., Disp: 180 capsule, Rfl: 1   famotidine  (PEPCID ) 20 MG tablet, TAKE 1 TABLET BY MOUTH TWICE A DAY, Disp: 180 tablet, Rfl: 3   fluticasone  (FLONASE ) 50 MCG/ACT nasal spray, SHAKE LIQUID AND USE 2 SPRAYS IN EACH NOSTRIL DAILY, Disp: 16 g, Rfl: 1   furosemide  (LASIX ) 20 MG tablet, TAKE 1 TABLET TWICE DAILY, Disp: 180 tablet, Rfl: 1   gabapentin  (NEURONTIN ) 600 MG tablet, TAKE 1 TABLET BY MOUTH THREE TIMES DAILY, Disp: 270 tablet, Rfl: 0   HYDROcodone -acetaminophen  (NORCO) 10-325 MG tablet, Take 1 tablet by mouth every 12 (twelve) hours. As needed for moderate or severe pain., Disp: 60 tablet, Rfl: 0   hydrOXYzine  (ATARAX ) 50 MG tablet, Take 1 tablet (50 mg total) by mouth every 8 (eight) hours as needed. For itching., Disp: 270 tablet, Rfl: 1   insulin  glargine, 2 Unit Dial, (TOUJEO  MAX SOLOSTAR) 300 UNIT/ML Solostar Pen, Inject 40 Units into the skin at bedtime.,  Disp: 12 mL, Rfl: 3   ipratropium (ATROVENT ) 0.06 % nasal spray, Place 2 sprays into both nostrils 4 (four) times daily., Disp: 15 mL, Rfl: 1   levocetirizine (XYZAL ) 5 MG tablet, TAKE 1 TABLET BY MOUTH EVERY EVENING, Disp: 90 tablet, Rfl: 0   losartan  (COZAAR ) 25 MG tablet, TAKE 1 TABLET EVERY DAY, Disp: 90 tablet, Rfl: 1   meclizine  (ANTIVERT ) 25 MG tablet, Take 1 tablet (25 mg total) by mouth 3 (three) times daily as needed for dizziness., Disp: 270 tablet, Rfl: 0   metoCLOPramide  (REGLAN ) 10 MG tablet, Take 0.5 tablets (5 mg total) by mouth 3 (three) times daily before meals., Disp: 135 tablet, Rfl: 1    mirabegron  ER (MYRBETRIQ ) 25 MG TB24 tablet, TAKE 1 TABLET BY MOUTH DAILY, Disp: 90 tablet, Rfl: 3   Misc. Devices [facility] CUSHION) MISC, 1 Device by Does not apply route daily., Disp: 1 each, Rfl: 0   montelukast  (SINGULAIR ) 10 MG tablet, TAKE 1 TABLET AT BEDTIME, Disp: 90 tablet, Rfl: 3   nystatin  (MYCOSTATIN /NYSTOP ) powder, Apply 1 Application topically 3 (three) times daily., Disp: 60 g, Rfl: 3   nystatin  (MYCOSTATIN /NYSTOP ) powder, Apply 1 Application topically 3 (three) times daily., Disp: 60 g, Rfl: 0   nystatin  ointment (MYCOSTATIN ), Apply 1 Application topically 2 (two) times daily., Disp: 30 g, Rfl: 4   nystatin -triamcinolone  ointment (MYCOLOG), Apply 1 Application topically 2 (two) times daily., Disp: 30 g, Rfl: 0   pantoprazole  (PROTONIX ) 40 MG tablet, TAKE 1 TABLET BY MOUTH DAILY, Disp: 90 tablet, Rfl: 3   pioglitazone  (ACTOS ) 30 MG tablet, TAKE 1 TABLET EVERY DAY, Disp: 90 tablet, Rfl: 3   pravastatin  (PRAVACHOL ) 10 MG tablet, Take 1 tablet (10 mg total) by mouth daily., Disp: 90 tablet, Rfl: 3   propranolol  (INDERAL ) 20 MG tablet, TAKE 1TABLET BY MOUTH TWICE DAILY, Disp: 180 tablet, Rfl: 1   Semaglutide , 1 MG/DOSE, (OZEMPIC , 1 MG/DOSE,) 4 MG/3ML SOPN, INJECT 1MG  INTO THE SKIN ONCE A WEEK, Disp: 3 mL, Rfl: 2   vitamin B-12 (CYANOCOBALAMIN ) 1000 MCG tablet, Take 1 tablet (1,000 mcg total) by mouth daily., Disp: 90 tablet, Rfl: 3   Medications ordered in this encounter:  Meds ordered this encounter  Medications   moxifloxacin  (VIGAMOX ) 0.5 % ophthalmic solution    Sig: Place 1 drop into both eyes 3 (three) times daily for 5 days.    Dispense:  3 mL    Refill:  0    Supervising Provider:   BLAISE ALEENE KIDD [[id number]]     *If you need refills on other medications prior to your next appointment, please contact your pharmacy*  Follow-Up: Call back or seek an in-person evaluation if the symptoms worsen or if the condition fails to improve as anticipated.  [facility] [phone]  Other Instructions Bacterial Conjunctivitis, Adult Bacterial conjunctivitis is an infection of the clear membrane that covers the white part of the eye and the inner surface of the eyelid (conjunctiva). When the blood vessels in the conjunctiva become inflamed, the eye becomes red or pink. The eye often feels irritated or itchy. Bacterial conjunctivitis spreads easily from person to person (is contagious). It also spreads easily from one eye to the other eye. What are the causes? This condition is caused by bacteria. You may get the infection if you come into close contact with: A person who is infected with the bacteria. Items that are contaminated with the bacteria, such as a face towel, contact lens solution,  or eye makeup. What increases the risk? You are more likely to develop this condition if: You are exposed to other people who have the infection. You wear contact lenses. You have a sinus infection. You have had a recent eye injury or surgery. You have a weak body defense system (immune system). You have a medical condition that causes dry eyes. What are the signs or symptoms? Symptoms of this condition include: Thick, yellowish discharge from the eye. This may turn into a crust on the eyelid overnight and cause your eyelids to stick together. Tearing or watery eyes. Itchy eyes. Burning feeling in your eyes. Eye redness. Swollen eyelids. Blurred vision. How is this diagnosed? This condition is diagnosed based on your symptoms and medical history. Your health care provider may also take a sample of discharge from your eye to find the cause of your infection. How is this treated? This condition may be treated with: Antibiotic eye drops or ointment to clear the infection more quickly and prevent the spread of infection to others. Antibiotic medicines taken by mouth (orally) to treat infections that do not respond to drops or ointments or that last longer than  10 days. Cool, wet cloths (cool compresses) placed on the eyes. Artificial tears applied 2-6 times a day. Follow these instructions at home: Medicines Take or apply your antibiotic medicine as told by your health care provider. Do not stop using the antibiotic, even if your condition improves, unless directed by your health care provider. Take or apply over-the-counter and prescription medicines only as told by your health care provider. Be very careful to avoid touching the edge of your eyelid with the eye-drop bottle or the ointment tube when you apply medicines to the affected eye. This will keep you from spreading the infection to your other eye or to other people. Managing discomfort Gently wipe away any drainage from your eye with a warm, wet washcloth or a cotton ball. Apply a clean, cool compress to your eye for 10-20 minutes, 3-4 times a day. General instructions Do not wear contact lenses until the inflammation is gone and your health care provider says it is safe to wear them again. Ask your health care provider how to sterilize or replace your contact lenses before you use them again. Wear glasses until you can resume wearing contact lenses. Avoid wearing eye makeup until the inflammation is gone. Throw away any old eye cosmetics that may be contaminated. Change or wash your pillowcase every day. Do not share towels or washcloths. This may spread the infection. Wash your hands often with soap and water for at least 20 seconds and especially before touching your face or eyes. Use paper towels to dry your hands. Avoid touching or rubbing your eyes. Do not drive or use heavy machinery if your vision is blurred. Contact a health care provider if: You have a fever. Your symptoms do not get better after 10 days. Get help right away if: You have a fever and your symptoms suddenly get worse. You have severe pain when you move your eye. You have facial pain, redness, or swelling. You  have a sudden loss of vision. Summary Bacterial conjunctivitis is an infection of the clear membrane that covers the white part of the eye and the inner surface of the eyelid (conjunctiva). Bacterial conjunctivitis spreads easily from eye to eye and from person to person (is contagious). Wash your hands often with soap and water for at least 20 seconds and especially before touching your  face or eyes. Use paper towels to dry your hands. Take or apply your antibiotic medicine as told by your health care provider. Do not stop using the antibiotic even if your condition improves. Contact a health care provider if you have a fever or if your symptoms do not get better after 10 days. Get help right away if you have a sudden loss of vision. This information is not intended to replace advice given to you by your health care provider. Make sure you discuss any questions you have with your health care provider. Document Revised: 01/13/2021 Document Reviewed: 01/13/2021 Elsevier Patient Education  2024 Elsevier Inc.   If you have been instructed to have an in-person evaluation today at a local Urgent Care facility, please use the link below. It will take you to a list of all of our available [facility], including address, phone number and hours of operation. Please do not delay care.  [facility]  If you or a family member do not have a primary care provider, use the link below to schedule a visit and establish care. When you choose a [facility] primary care physician or advanced practice provider, you gain a long-term partner in health. Find a Primary Care Provider  Learn more about [facility]'s in-office and virtual care options: [facility] - Get Care Now

## 2024-05-17 NOTE — Telephone Encounter (Signed)
 FYI Only or Action Required?: Action required by provider: clinical question for provider.  Patient was last seen in primary care on 04/22/2024 by Antoniette Vermell CROME, PA-C.  Called Nurse Triage reporting Conjunctivitis.  Symptoms began several days ago.  Interventions attempted: Nothing.  Symptoms are: gradually worsening.  Triage Disposition: No disposition on file.  Patient/caregiver understands and will follow disposition?:  Answer Assessment - Initial Assessment Questions Bilateral eye pruritus x 3 days.   1. EYE DISCHARGE: Is the discharge in one or both eyes? What color is it? How much is there? When did the discharge start?      Yes  2. REDNESS OF SCLERA: Is there redness in the white of the eye? If Yes, ask: Is it in one or both eyes? When did the redness start?     Yes  3. EYELIDS: Are the eyelids red or swollen? If Yes, ask: How much?      Swelling  4. VISION: Do you have blurred vision?     Affecting vision  5. PAIN: Is there any pain? If Yes, ask: How bad is the pain? (Scale 0-10; or none, mild, moderate, severe)     Denies  6. CONTACT LENS: Do you wear contacts?     Denies  Protocols used: Eye - Pus or Discharge-A-AH Copied from CRM [id number]. Topic: Clinical - Red Word Triage >> May 17, 2024  2:56 PM Adrianna P wrote: Red Word that prompted transfer to Nurse Triage: She has pink eye in both eyes for 3 days now,trouble seeing out eyes    ----------------------------------------------------------------------- From previous Reason for Contact - Medical Advice: Reason for CRM: She has pink eye in both eyes for 3 days now,trouble seeing out eyes she is ha and would like Jade to call in something for it

## 2024-05-17 NOTE — Progress Notes (Signed)
 Virtual Visit Consent   [name] Rice, you are scheduled for a virtual visit with a [facility] provider today. Just as with appointments in the office, your consent must be obtained to participate. Your consent will be active for this visit and any virtual visit you may have with one of our providers in the next 365 days. If you have a MyChart account, a copy of this consent can be sent to you electronically.  As this is a virtual visit, video technology does not allow for your provider to perform a traditional examination. This may limit your provider's ability to fully assess your condition. If your provider identifies any concerns that need to be evaluated in person or the need to arrange testing (such as labs, EKG, etc.), we will make arrangements to do so. Although advances in technology are sophisticated, we cannot ensure that it will always work on either your end or our end. If the connection with a video visit is poor, the visit may have to be switched to a telephone visit. With either a video or telephone visit, we are not always able to ensure that we have a secure connection.  By engaging in this virtual visit, you consent to the provision of healthcare and authorize for your insurance to be billed (if applicable) for the services provided during this visit. Depending on your insurance coverage, you may receive a charge related to this service.  I need to obtain your verbal consent now. Are you willing to proceed with your visit today? [name] Rice has provided verbal consent on 05/17/2024 for a virtual visit (video or telephone). Delon CHRISTELLA Dickinson, PA-C  Date: 05/17/2024 4:59 PM   Virtual Visit via Video Note   I, Delon CHRISTELLA Dickinson, connected with  [name] Rice  ([id number], 70/11/1955) on 05/17/24 at  4:30 PM EDT by a video-enabled telemedicine application and verified that I am speaking with the correct person using two identifiers.  Location: Patient: Virtual Visit Location  Patient: Home Provider: Virtual Visit Location Provider: Home Office   I discussed the limitations of evaluation and management by telemedicine and the availability of in person appointments. The patient expressed understanding and agreed to proceed.    History of Present Illness: [name] Rice is a 68 y.o. who identifies as a female who was assigned female at birth, and is being seen today for possible pink eye.  HPI: Conjunctivitis  The current episode started 3 to 5 days ago (Started in right eye 3 days; started in left eye yesterday). The onset was gradual. The problem occurs continuously. The problem has been gradually worsening. The problem is mild. Nothing relieves the symptoms. Nothing aggravates the symptoms. Associated symptoms include decreased vision, eye itching, rhinorrhea (chronic), eye discharge, eye pain and eye redness. Pertinent negatives include no fever, no double vision, no photophobia, no headaches and no URI. Associated symptoms comments: General malaise. The eye pain is mild. Both eyes are affected. The eye pain is not associated with movement. The eyelid exhibits redness.   PMH: chronic dry eye   Problems:  Patient Active Problem List   Diagnosis Date Noted   Severe episode of recurrent major depressive disorder, without psychotic features (HCC) 04/23/2024   At high risk for falls 01/16/2024   Dysuria 12/12/2023   Hypotension 12/12/2023   S/P cholecystectomy 10/20/2023   Rupture of operation wound 10/20/2023   Acute cystitis with hematuria 10/09/2023   Leukocytosis 10/09/2023   AKI (acute kidney injury) (HCC) 10/03/2023  Syncope and collapse 10/03/2023   OSA (obstructive sleep apnea) 05/15/2023   Chronic rhinitis 05/15/2023   Class 3 severe obesity due to excess calories with serious comorbidity and body mass index (BMI) of 40.0 to 44.9 in adult 05/15/2023   Seasonal allergic rhinitis due to pollen 05/15/2023   Essential hypertension 05/15/2023   Cellulitis  01/06/2023   Fungal dermatitis 01/06/2023   Tubular adenoma of colon 09/16/2022   Clostridioides difficile infection 09/05/2022   Fistula of sigmoid colon 09/05/2022   Acute left-sided low back pain with left-sided sciatica 09/05/2022   Bilateral leg pain 07/27/2022   Itching 07/27/2022   Diminished pulses in lower extremity 04/12/2022   Blood clot in eye 12/31/2021   Osteopenia 12/30/2021   Partial nontraumatic tear of left rotator cuff 08/16/2021   Rosacea 04/02/2021   Chronic right shoulder pain 04/02/2021   Problems with swallowing 03/22/2021   Allergic conjunctivitis of both eyes 03/22/2021   Left ventricular hypertrophy 07/23/2020   Moderate episode of recurrent major depressive disorder (HCC) 07/14/2020   Paresthesia 06/30/2020   Weakness 06/30/2020   Chronic fatigue 06/30/2020   Bilateral rotator cuff syndrome 01/24/2020   DDD (degenerative disc disease), cervical 12/27/2019   Trigger finger, right middle finger 12/24/2019   Trigger index finger of right hand 12/24/2019   Chronic kidney disease (CKD), stage III (moderate) (HCC) 10/04/2019   Acute right ankle pain 09/25/2019   OAB (overactive bladder) 09/25/2019   Type 2 diabetes mellitus with diabetic neuropathy, with long-term current use of insulin  (HCC) 06/20/2019   Dizziness 06/20/2019   Bilateral carpal tunnel syndrome post carpal tunnel release on the left 01/24/2019   Pressure injury of right foot, unstageable (HCC) 09/25/2018   Calcification and ossification of muscle 07/13/2018   Drug-induced constipation 05/23/2018   Restrictive lung disease 04/12/2018   Mixed hyperlipidemia 04/12/2018   Vitamin D  insufficiency 04/12/2018   Hordeolum externum of right lower eyelid 02/16/2018   Hot flashes 02/16/2018   Ear itching 01/17/2018   No energy 01/17/2018   Vasomotor rhinitis 11/28/2017   Lisfranc's dislocation, right, initial encounter 10/06/2017   Freiberg's disease, right 10/06/2017   Neck pain 07/11/2017    Dystonia 09/21/2016   Acute reaction to stress 03/28/2016   Post-menopausal 03/16/2016   Intertrigo 03/15/2016   Left knee pain 03/15/2016   Morbid obesity due to excess calories (HCC) 02/16/2016   Unstable balance 02/16/2016   Chronic pain syndrome 12/15/2015   Non-proliferative diabetic retinopathy, right eye (HCC) 11/23/2015   Diarrhea 01/12/2015   Ear pain 12/09/2014   Type 2 diabetes, controlled, with peripheral neuropathy (HCC) 12/09/2014   Gastroparesis due to secondary diabetes (HCC) 09/16/2014   Diabetic peripheral neuropathy (HCC) 05/30/2014   Diverticulitis 11/27/2013   Hepatic steatosis 09/26/2013   SOB (shortness of breath) on exertion 03/31/2013   Intestinal bacterial overgrowth 09/20/2012   Lipoma of abdominal wall 05/01/2012   Iron deficiency anemia 03/29/2012   History of allergic rhinitis 09/07/2011   Preventative health care 08/16/2011   Insomnia 02/06/2011   OSA on CPAP    Allergic sinusitis 05/27/2009   Depression with anxiety 01/28/2008   Acute bilateral low back pain 11/27/2007   Type II or unspecified type diabetes mellitus with neurological manifestations, not stated as uncontrolled(250.60) 08/08/2006   Hyperlipidemia LDL goal <70 08/08/2006   Hypertension associated with diabetes (HCC) 08/08/2006   GERD 08/08/2006    Allergies:  Allergies  Allergen Reactions   Ace Inhibitors Other (See Comments)    Causes blood pressure to drop  rapidly   Atrovent  [Ipratropium] Swelling    Throat swelling on Atorvent Nasal Spray   Cefaclor Hives and Itching   Glyxambi  [Empagliflozin -Linagliptin ]     Itching/rash   Shrimp [Shellfish Allergy] Nausea And Vomiting   Farxiga  [Dapagliflozin ]     Itching/rash   Metformin  And Related     GI upset   Wellbutrin  [Bupropion ]     nightmares   Ceclor [Cefaclor]    Sulfa Antibiotics    Sulfonamide Derivatives Rash   Medications:  Current Outpatient Medications:    moxifloxacin  (VIGAMOX ) 0.5 % ophthalmic solution, Place  1 drop into both eyes 3 (three) times daily for 5 days., Disp: 3 mL, Rfl: 0   Accu-Chek Softclix Lancets lancets, Use as needed for glucose check due to T2DM  up to 4 times a day., Disp: 100 each, Rfl: 12   Alcohol  Swabs (B-D SINGLE USE SWABS REGULAR) PADS, 1 Device by Does not apply route 4 (four) times daily as needed., Disp: 100 each, Rfl: 1   AMBULATORY NON FORMULARY MEDICATION, Walk in shower needed in apartment due to mobility issues and high fall risk., Disp: 1 Device, Rfl: 0   ARIPiprazole  (ABILIFY ) 5 MG tablet, Take 1 tablet (5 mg total) by mouth at bedtime., Disp: 30 tablet, Rfl: 2   azelastine  (OPTIVAR ) 0.05 % ophthalmic solution, INSTILL 1 DROP INTO BOTH EYES TWICE DAILY, Disp: 24 mL, Rfl: 0   Blood Glucose Calibration (ACCU-CHEK GUIDE CONTROL) LIQD, 1 each by In Vitro route as directed., Disp: 1 each, Rfl: 3   Blood Glucose Monitoring Suppl (ACCU-CHEK GUIDE ME) w/Device KIT, 1 each by Does not apply route as directed., Disp: 1 kit, Rfl: 0   Brexpiprazole  (REXULTI ) 0.5 MG TABS, Take one tablet daily for depression., Disp: 30 tablet, Rfl: 2   Cholecalciferol  (VITAMIN D ) 50 MCG (2000 UT) CAPS, Take 2,000 Units by mouth daily., Disp: , Rfl:    Continuous Glucose Sensor (FREESTYLE LIBRE 3 PLUS SENSOR) MISC, Change sensor every 15 days., Disp: 2 each, Rfl: 2   dicyclomine  (BENTYL ) 20 MG tablet, Take 1 tablet (20 mg total) by mouth 2 (two) times daily as needed for spasms., Disp: 24 tablet, Rfl: 0   DROPLET PEN NEEDLES 32G X 6 MM MISC, USE THREE TIMES DAILY  WITH  NOVOLOG , Disp: 300 each, Rfl: 1   DULoxetine  (CYMBALTA ) 60 MG capsule, Take 2 capsules (120 mg total) by mouth daily., Disp: 180 capsule, Rfl: 1   famotidine  (PEPCID ) 20 MG tablet, TAKE 1 TABLET BY MOUTH TWICE A DAY, Disp: 180 tablet, Rfl: 3   fluticasone  (FLONASE ) 50 MCG/ACT nasal spray, SHAKE LIQUID AND USE 2 SPRAYS IN EACH NOSTRIL DAILY, Disp: 16 g, Rfl: 1   furosemide  (LASIX ) 20 MG tablet, TAKE 1 TABLET TWICE DAILY, Disp: 180  tablet, Rfl: 1   gabapentin  (NEURONTIN ) 600 MG tablet, TAKE 1 TABLET BY MOUTH THREE TIMES DAILY, Disp: 270 tablet, Rfl: 0   HYDROcodone -acetaminophen  (NORCO) 10-325 MG tablet, Take 1 tablet by mouth every 12 (twelve) hours. As needed for moderate or severe pain., Disp: 60 tablet, Rfl: 0   hydrOXYzine  (ATARAX ) 50 MG tablet, Take 1 tablet (50 mg total) by mouth every 8 (eight) hours as needed. For itching., Disp: 270 tablet, Rfl: 1   insulin  glargine, 2 Unit Dial, (TOUJEO  MAX SOLOSTAR) 300 UNIT/ML Solostar Pen, Inject 40 Units into the skin at bedtime., Disp: 12 mL, Rfl: 3   ipratropium (ATROVENT ) 0.06 % nasal spray, Place 2 sprays into both nostrils 4 (four) times daily.,  Disp: 15 mL, Rfl: 1   levocetirizine (XYZAL ) 5 MG tablet, TAKE 1 TABLET BY MOUTH EVERY EVENING, Disp: 90 tablet, Rfl: 0   losartan  (COZAAR ) 25 MG tablet, TAKE 1 TABLET EVERY DAY, Disp: 90 tablet, Rfl: 1   meclizine  (ANTIVERT ) 25 MG tablet, Take 1 tablet (25 mg total) by mouth 3 (three) times daily as needed for dizziness., Disp: 270 tablet, Rfl: 0   metoCLOPramide  (REGLAN ) 10 MG tablet, Take 0.5 tablets (5 mg total) by mouth 3 (three) times daily before meals., Disp: 135 tablet, Rfl: 1   mirabegron  ER (MYRBETRIQ ) 25 MG TB24 tablet, TAKE 1 TABLET BY MOUTH DAILY, Disp: 90 tablet, Rfl: 3   Misc. Devices [facility] CUSHION) MISC, 1 Device by Does not apply route daily., Disp: 1 each, Rfl: 0   montelukast  (SINGULAIR ) 10 MG tablet, TAKE 1 TABLET AT BEDTIME, Disp: 90 tablet, Rfl: 3   nystatin  (MYCOSTATIN /NYSTOP ) powder, Apply 1 Application topically 3 (three) times daily., Disp: 60 g, Rfl: 3   nystatin  (MYCOSTATIN /NYSTOP ) powder, Apply 1 Application topically 3 (three) times daily., Disp: 60 g, Rfl: 0   nystatin  ointment (MYCOSTATIN ), Apply 1 Application topically 2 (two) times daily., Disp: 30 g, Rfl: 4   nystatin -triamcinolone  ointment (MYCOLOG), Apply 1 Application topically 2 (two) times daily., Disp: 30 g, Rfl: 0   pantoprazole   (PROTONIX ) 40 MG tablet, TAKE 1 TABLET BY MOUTH DAILY, Disp: 90 tablet, Rfl: 3   pioglitazone  (ACTOS ) 30 MG tablet, TAKE 1 TABLET EVERY DAY, Disp: 90 tablet, Rfl: 3   pravastatin  (PRAVACHOL ) 10 MG tablet, Take 1 tablet (10 mg total) by mouth daily., Disp: 90 tablet, Rfl: 3   propranolol  (INDERAL ) 20 MG tablet, TAKE 1TABLET BY MOUTH TWICE DAILY, Disp: 180 tablet, Rfl: 1   Semaglutide , 1 MG/DOSE, (OZEMPIC , 1 MG/DOSE,) 4 MG/3ML SOPN, INJECT 1MG  INTO THE SKIN ONCE A WEEK, Disp: 3 mL, Rfl: 2   vitamin B-12 (CYANOCOBALAMIN ) 1000 MCG tablet, Take 1 tablet (1,000 mcg total) by mouth daily., Disp: 90 tablet, Rfl: 3  Observations/Objective: Patient is well-developed, well-nourished in no acute distress.  Resting comfortably at home.  Head is normocephalic, atraumatic.  No labored breathing.  Speech is clear and coherent with logical content.  Patient is alert and oriented at baseline.  Bilateral eyes injected (R>L), mild redness and mild swelling noted of the eyelids bilaterally, EOM grossly intact, Pupils are round and equal  Assessment and Plan: 1. Bacterial conjunctivitis (Primary) - moxifloxacin  (VIGAMOX ) 0.5 % ophthalmic solution; Place 1 drop into both eyes 3 (three) times daily for 5 days.  Dispense: 3 mL; Refill: 0  - Suspect bacterial conjunctivitis - Moxifloxacin  prescribed - Warm compresses - Good hand hygiene - Seek in person evaluation if symptoms worsen or fail to improve   Follow Up Instructions: I discussed the assessment and treatment plan with the patient. The patient was provided an opportunity to ask questions and all were answered. The patient agreed with the plan and demonstrated an understanding of the instructions.  A copy of instructions were sent to the patient via MyChart unless otherwise noted below.    The patient was advised to call back or seek an in-person evaluation if the symptoms worsen or if the condition fails to improve as anticipated.    Delon CHRISTELLA Dickinson, PA-C

## 2024-05-19 DIAGNOSIS — S199XXA Unspecified injury of neck, initial encounter: Secondary | ICD-10-CM | POA: Diagnosis not present

## 2024-05-19 DIAGNOSIS — M25572 Pain in left ankle and joints of left foot: Secondary | ICD-10-CM | POA: Diagnosis not present

## 2024-05-19 DIAGNOSIS — M25531 Pain in right wrist: Secondary | ICD-10-CM | POA: Diagnosis not present

## 2024-05-19 DIAGNOSIS — M21241 Flexion deformity, right finger joints: Secondary | ICD-10-CM | POA: Diagnosis not present

## 2024-05-19 DIAGNOSIS — F329 Major depressive disorder, single episode, unspecified: Secondary | ICD-10-CM | POA: Diagnosis not present

## 2024-05-19 DIAGNOSIS — R296 Repeated falls: Secondary | ICD-10-CM | POA: Diagnosis not present

## 2024-05-19 DIAGNOSIS — S91311A Laceration without foreign body, right foot, initial encounter: Secondary | ICD-10-CM | POA: Diagnosis not present

## 2024-05-19 DIAGNOSIS — R442 Other hallucinations: Secondary | ICD-10-CM | POA: Diagnosis not present

## 2024-05-19 DIAGNOSIS — M25551 Pain in right hip: Secondary | ICD-10-CM | POA: Diagnosis not present

## 2024-05-19 DIAGNOSIS — R440 Auditory hallucinations: Secondary | ICD-10-CM | POA: Diagnosis not present

## 2024-05-19 DIAGNOSIS — W19XXXA Unspecified fall, initial encounter: Secondary | ICD-10-CM | POA: Diagnosis not present

## 2024-05-19 DIAGNOSIS — S0990XA Unspecified injury of head, initial encounter: Secondary | ICD-10-CM | POA: Diagnosis not present

## 2024-05-19 DIAGNOSIS — E876 Hypokalemia: Secondary | ICD-10-CM | POA: Diagnosis not present

## 2024-05-19 DIAGNOSIS — S0093XA Contusion of unspecified part of head, initial encounter: Secondary | ICD-10-CM | POA: Diagnosis not present

## 2024-05-19 DIAGNOSIS — F331 Major depressive disorder, recurrent, moderate: Secondary | ICD-10-CM | POA: Diagnosis not present

## 2024-05-19 DIAGNOSIS — N3020 Other chronic cystitis without hematuria: Secondary | ICD-10-CM | POA: Diagnosis not present

## 2024-05-19 DIAGNOSIS — Z87891 Personal history of nicotine dependence: Secondary | ICD-10-CM | POA: Diagnosis not present

## 2024-05-19 DIAGNOSIS — R58 Hemorrhage, not elsewhere classified: Secondary | ICD-10-CM | POA: Diagnosis not present

## 2024-05-19 DIAGNOSIS — Z733 Stress, not elsewhere classified: Secondary | ICD-10-CM | POA: Diagnosis not present

## 2024-05-19 DIAGNOSIS — F32A Depression, unspecified: Secondary | ICD-10-CM | POA: Diagnosis not present

## 2024-05-19 DIAGNOSIS — Z993 Dependence on wheelchair: Secondary | ICD-10-CM | POA: Diagnosis not present

## 2024-05-27 ENCOUNTER — Telehealth: Payer: Self-pay

## 2024-05-27 NOTE — Telephone Encounter (Signed)
 Copied from CRM #[id number]. Topic: Clinical - Medical Advice >> May 27, 2024 12:41 PM Graeme ORN wrote: Reason for CRM: Patient called - got Stitches in foot on Friday from fall. Was told they would have to be redone if she got the wet and she forgot and stuck them in the water. Its only 3 stitches. She wants to know if Vermell will need to redo them on Monday at Appt. Thank You

## 2024-05-27 NOTE — Telephone Encounter (Signed)
 We might not re-do them but we can get them out so they don't cause infection and replace with dermabond or steri-strips.

## 2024-05-28 NOTE — Telephone Encounter (Signed)
 Attempted call to patient. Left a voice mail message requesting a return call.

## 2024-05-30 ENCOUNTER — Encounter: Payer: Self-pay | Admitting: Family Medicine

## 2024-05-30 ENCOUNTER — Ambulatory Visit (INDEPENDENT_AMBULATORY_CARE_PROVIDER_SITE_OTHER): Admitting: Family Medicine

## 2024-05-30 VITALS — BP 164/84 | HR 125 | Temp 98.0°F | Resp 18 | Ht 63.0 in | Wt 208.0 lb

## 2024-05-30 DIAGNOSIS — S91311D Laceration without foreign body, right foot, subsequent encounter: Secondary | ICD-10-CM | POA: Diagnosis not present

## 2024-05-30 NOTE — Progress Notes (Signed)
   Acute Office Visit  Subjective:     Patient ID: [name] Rice, female    DOB: 04-13-1956, 68 y.o.   MRN: [id number]  Chief Complaint  Patient presents with   Wound Check    Patient has a laceration on her right heel with stitches , Area is red, swollen , with some drainage. Patient would like for incision site to be looked at.    Wound Check  Patient is in today for acute visit. Pt of [facility].  Here for wound check. Had a fall on 8/3 and suffered a laceration to her right heel requiring 3 sutures. Daughter states pt got her heel wet this morning. Thought she saw drainage from the wound. Today's inspection shows no redness nor drainage. Sutures intact. Pt has appt already made with PCP on Monday.  Review of Systems  Skin:        Right heel laceration with sutures  All other systems reviewed and are negative.       Objective:    BP (!) 164/84   Pulse (!) 125   Temp 98 F (36.7 C) (Oral)   Resp 18   Ht 5' 3 (1.6 m)   Wt 208 lb (94.3 kg) Comment: Reported by patient , patient unable to stand  LMP 10/18/1995   SpO2 97%   BMI 36.85 kg/m    Physical Exam Vitals and nursing note reviewed.  Constitutional:      Appearance: Normal appearance. She is obese.  HENT:     Head: Normocephalic and atraumatic.     Right Ear: External ear normal.     Left Ear: External ear normal.     Nose: Nose normal.     Mouth/Throat:     Mouth: Mucous membranes are moist.     Pharynx: Oropharynx is clear.  Eyes:     Conjunctiva/sclera: Conjunctivae normal.     Pupils: Pupils are equal, round, and reactive to light.  Cardiovascular:     Rate and Rhythm: Normal rate.  Pulmonary:     Effort: Pulmonary effort is normal.  Skin:    General: Skin is warm and dry.     Capillary Refill: Capillary refill takes less than 2 seconds.     Comments: Sutures intact, dry, no signs of erythema or drainage today  Neurological:     General: No focal deficit present.     Mental Status: She is alert  and oriented to person, place, and time. Mental status is at baseline.  Psychiatric:        Mood and Affect: Mood normal.        Behavior: Behavior normal.        Thought Content: Thought content normal.        Judgment: Judgment normal.    No results found for any visits on 05/30/24.      Assessment & Plan:   Problem List Items Addressed This Visit   None Visit Diagnoses       Laceration of right heel, subsequent encounter    -  Primary     Sutures intact. Keep until Monday for total of 14 days. See PCP as scheduled. No signs of infection today.   No orders of the defined types were placed in this encounter.   No follow-ups on file.  Torrence CINDERELLA Barrier, MD Total time spent with patient today 20 minutes. This includes reviewing records, evaluating the patient and coordinating care. Face-to-face time >50%.

## 2024-05-30 NOTE — Telephone Encounter (Signed)
 Spoke with patient and patient daughter-  scheduled with Alethea Rucker  for 3pm today to check incision site  as patient daughter states site is red, swollen and has puss drainage at site =

## 2024-06-03 ENCOUNTER — Ambulatory Visit: Admitting: Physician Assistant

## 2024-06-03 ENCOUNTER — Ambulatory Visit (INDEPENDENT_AMBULATORY_CARE_PROVIDER_SITE_OTHER)

## 2024-06-03 DIAGNOSIS — R404 Transient alteration of awareness: Secondary | ICD-10-CM

## 2024-06-03 DIAGNOSIS — Z4802 Encounter for removal of sutures: Secondary | ICD-10-CM

## 2024-06-03 NOTE — Progress Notes (Signed)
 Removed 3 sutures from the bottom of her right foot.

## 2024-06-06 ENCOUNTER — Telehealth: Payer: Self-pay | Admitting: Physician Assistant

## 2024-06-06 DIAGNOSIS — G894 Chronic pain syndrome: Secondary | ICD-10-CM

## 2024-06-06 NOTE — Telephone Encounter (Unsigned)
 Copied from CRM #[id number]. Topic: Clinical - Medication Refill >> Jun 06, 2024  4:37 PM Kevelyn M wrote: Medication: HYDROcodone -acetaminophen  (NORCO) 10-325 MG  Has the patient contacted their pharmacy?Yes (Agent: If no, request that the patient contact the pharmacy for the refill. If patient does not wish to contact the pharmacy document the reason why and proceed with request.) (Agent: If yes, when and what did the pharmacy advise?)  This is the patient's preferred pharmacy:  [id number] - [place], [place] - [address] [facility] [address] [place] [address] Phone: [phone] Fax: [phone]  Is this the correct pharmacy for this prescription? Yes If no, delete pharmacy and type the correct one.   Has the prescription been filled recently? No  Is the patient out of the medication? No, 3 left  Has the patient been seen for an appointment in the last year OR does the patient have an upcoming appointment? Yes  Can we respond through MyChart? Yes  Agent: Please be advised that Rx refills may take up to 3 business days. We ask that you follow-up with your pharmacy.

## 2024-06-07 ENCOUNTER — Telehealth: Payer: Self-pay

## 2024-06-07 DIAGNOSIS — R404 Transient alteration of awareness: Secondary | ICD-10-CM | POA: Insufficient documentation

## 2024-06-07 MED ORDER — HYDROCODONE-ACETAMINOPHEN 10-325 MG PO TABS: 1.0000 | ORAL_TABLET | Freq: Two times a day (BID) | ORAL | 0 refills | Status: DC

## 2024-06-07 NOTE — Addendum Note (Signed)
 Addended by: ANTONIETTE VERMELL CROME on: 06/07/2024 04:22 PM   Modules accepted: Orders

## 2024-06-07 NOTE — Telephone Encounter (Signed)
 Sent to pharmacy

## 2024-06-07 NOTE — Telephone Encounter (Signed)
 Copied from CRM [id number]. Topic: Clinical - Medical Advice >> Apr 25, 2024  3:51 PM Adrianna P wrote: Reason for CRM: Patient will need a temporary caregiver. Please call [phone] >> May 31, 2024 12:48 PM Vermell Bologna wrote: Needs appt and then can do referral.  >> Apr 26, 2024  8:50 AM CMA Bascom B wrote: Patient requesting caregiver

## 2024-06-07 NOTE — Telephone Encounter (Signed)
 Attempted call to patient.  Has upcoming appt schld for 06/18/2024 for f/u Called patient to see if this time frame would be adequate for her needs.  Left a voice mail message requesting a return call.

## 2024-06-10 NOTE — Telephone Encounter (Signed)
 Again attempted call to patient. Left a voice mail message requesting a return call.

## 2024-06-12 ENCOUNTER — Ambulatory Visit

## 2024-06-12 ENCOUNTER — Other Ambulatory Visit

## 2024-06-13 ENCOUNTER — Ambulatory Visit

## 2024-06-18 ENCOUNTER — Ambulatory Visit (INDEPENDENT_AMBULATORY_CARE_PROVIDER_SITE_OTHER): Admitting: Physician Assistant

## 2024-06-18 VITALS — BP 130/65 | HR 109

## 2024-06-18 DIAGNOSIS — E1140 Type 2 diabetes mellitus with diabetic neuropathy, unspecified: Secondary | ICD-10-CM

## 2024-06-18 DIAGNOSIS — E1159 Type 2 diabetes mellitus with other circulatory complications: Secondary | ICD-10-CM

## 2024-06-18 DIAGNOSIS — N3281 Overactive bladder: Secondary | ICD-10-CM

## 2024-06-18 DIAGNOSIS — E1142 Type 2 diabetes mellitus with diabetic polyneuropathy: Secondary | ICD-10-CM

## 2024-06-18 DIAGNOSIS — Z7985 Long-term (current) use of injectable non-insulin antidiabetic drugs: Secondary | ICD-10-CM

## 2024-06-18 DIAGNOSIS — I152 Hypertension secondary to endocrine disorders: Secondary | ICD-10-CM | POA: Diagnosis not present

## 2024-06-18 DIAGNOSIS — Z23 Encounter for immunization: Secondary | ICD-10-CM

## 2024-06-18 DIAGNOSIS — Z794 Long term (current) use of insulin: Secondary | ICD-10-CM

## 2024-06-18 DIAGNOSIS — G894 Chronic pain syndrome: Secondary | ICD-10-CM

## 2024-06-18 DIAGNOSIS — R404 Transient alteration of awareness: Secondary | ICD-10-CM

## 2024-06-18 LAB — POCT GLYCOSYLATED HEMOGLOBIN (HGB A1C): Hemoglobin A1C: 6.6 % — AB (ref 4.0–5.6)

## 2024-06-18 MED ORDER — MIRABEGRON ER 25 MG PO TB24: 25.0000 mg | ORAL_TABLET | Freq: Every day | ORAL | 3 refills | Status: AC

## 2024-06-18 MED ORDER — LOSARTAN POTASSIUM 25 MG PO TABS
ORAL_TABLET | ORAL | 1 refills | Status: AC
Start: 2024-06-18 — End: ?

## 2024-06-18 NOTE — Progress Notes (Signed)
 Established Patient Office Visit  Subjective   Patient ID: [name] Rice, female    DOB: [birth year]  Age: 68 y.o. MRN: [id number]  Chief Complaint  Patient presents with   Medical Management of Chronic Issues    HPI Pt is a 68 yo obese female with T2DM with diabetic neuropathy, HTN, HLD, OSA, MDD, chronic pain due to Wenda presents to the clinic for 3 month follow up.  She is accompanied by her daughter and caregiver.   Pt was having staring events and hallucinations. but they stopped after she stopped abilify . Her mood is overall doing well and she does not want to try another medication to replace it. She was never called by neurology.   She is compliant with medications. She is not using her libre to check sugars right now. She is eating 2 meals a day and losing weight. She has lost almost 20lbs in last month. She denies any known hypoglycemic events. She is not active and sedentary most of the day.   OAB controlled with myrbetriq .    ROS See HPI.    Objective:     BP 130/65   Pulse (!) 109   LMP 10/18/1995   SpO2 100%  BP Readings from Last 3 Encounters:  06/18/24 130/65  05/30/24 (!) 164/84  04/22/24 (!) 109/50   Wt Readings from Last 3 Encounters:  05/30/24 208 lb (94.3 kg)  04/22/24 226 lb (102.5 kg)  01/16/24 226 lb 12 oz (102.9 kg)    .SABRA Results for orders placed or performed in visit on 06/18/24  POCT HgB A1C   Collection Time: 06/18/24 10:50 AM  Result Value Ref Range   Hemoglobin A1C 6.6 (A) 4.0 - 5.6 %   HbA1c POC (<> result, manual entry)     HbA1c, POC (prediabetic range)     HbA1c, POC (controlled diabetic range)     *Note: Due to a large number of results and/or encounters for the requested time period, some results have not been displayed. A complete set of results can be found in Results Review.     Physical Exam Constitutional:      Appearance: Normal appearance. She is obese.  HENT:     Head: Normocephalic.  Cardiovascular:      Rate and Rhythm: Normal rate and regular rhythm.     Heart sounds: Murmur heard.  Pulmonary:     Effort: Pulmonary effort is normal.  Neurological:     General: No focal deficit present.     Mental Status: She is alert and oriented to person, place, and time.      The 10-year ASCVD risk score (Arnett DK, et al., 2019) is: 23.4%    Assessment & Plan:  [name] was seen today for medical management of chronic issues.  Diagnoses and all orders for this visit:  Type 2 diabetes mellitus with diabetic neuropathy, with long-term current use of insulin  (HCC) -     POCT HgB A1C  Chronic pain syndrome  Hypertension associated with diabetes (HCC) -     losartan  (COZAAR ) 25 MG tablet; TAKE 1 TABLET EVERY DAY  OAB (overactive bladder) -     mirabegron  ER (MYRBETRIQ ) 25 MG TB24 tablet; Take 1 tablet (25 mg total) by mouth daily.  Need for influenza vaccination -     Flu vaccine HIGH DOSE PF(Fluzone Trivalent)  Episodes of staring   A1C to goal Continue on same medications with ozempic , toujeo  BP to goal, on ARB On statin Eye exam  UTD Foot exam UTD Flu shot given today  Starring episodes seemed to resolve after stopping abilify  Follow up as needed  OAB-myrbetriq  refilled.    Return in about 3 months (around 09/17/2024).    Shaylon Gillean, PA-C

## 2024-06-18 NOTE — Patient Instructions (Addendum)
 Stay off abilify . Let me know if have any other hallucinations or staring episodes.  Continue on same diabetic medication.

## 2024-06-20 NOTE — Telephone Encounter (Signed)
 Patient seen in office 06/18/2024

## 2024-06-26 ENCOUNTER — Other Ambulatory Visit: Payer: Self-pay | Admitting: Physician Assistant

## 2024-06-26 DIAGNOSIS — J301 Allergic rhinitis due to pollen: Secondary | ICD-10-CM

## 2024-07-08 ENCOUNTER — Encounter: Payer: Self-pay | Admitting: Physician Assistant

## 2024-07-08 NOTE — Telephone Encounter (Signed)
 Copied from CRM #[id number]. Topic: Clinical - Medical Advice >> Jul 08, 2024 10:21 AM Diannia H wrote: Reason for CRM: Patient is calling because she is wanting to speak with the provider about her seeing a neurologist. Could you assist? Patients callback number is [phone]. She stated the provider already suggested it and she said no at the time but now she's ready. She states she is seeing things again.

## 2024-07-15 ENCOUNTER — Encounter: Payer: Self-pay | Admitting: Physician Assistant

## 2024-07-15 MED ORDER — FREESTYLE LIBRE 3 READER DEVI: 0 refills | Status: DC

## 2024-07-15 NOTE — Telephone Encounter (Signed)
 Can we find out if she is having hallucinations. We might need to get her into behavioral health?

## 2024-07-18 ENCOUNTER — Other Ambulatory Visit: Payer: Self-pay | Admitting: Physician Assistant

## 2024-07-18 DIAGNOSIS — K219 Gastro-esophageal reflux disease without esophagitis: Secondary | ICD-10-CM

## 2024-08-05 ENCOUNTER — Other Ambulatory Visit: Payer: Self-pay | Admitting: Physician Assistant

## 2024-08-05 ENCOUNTER — Other Ambulatory Visit: Payer: Self-pay

## 2024-08-05 ENCOUNTER — Ambulatory Visit: Payer: Self-pay

## 2024-08-05 ENCOUNTER — Telehealth: Payer: Self-pay

## 2024-08-05 DIAGNOSIS — K219 Gastro-esophageal reflux disease without esophagitis: Secondary | ICD-10-CM | POA: Diagnosis not present

## 2024-08-05 DIAGNOSIS — G894 Chronic pain syndrome: Secondary | ICD-10-CM

## 2024-08-05 DIAGNOSIS — Z79899 Other long term (current) drug therapy: Secondary | ICD-10-CM | POA: Diagnosis not present

## 2024-08-05 DIAGNOSIS — Z794 Long term (current) use of insulin: Secondary | ICD-10-CM | POA: Diagnosis not present

## 2024-08-05 DIAGNOSIS — N390 Urinary tract infection, site not specified: Secondary | ICD-10-CM | POA: Diagnosis not present

## 2024-08-05 DIAGNOSIS — I10 Essential (primary) hypertension: Secondary | ICD-10-CM | POA: Diagnosis not present

## 2024-08-05 DIAGNOSIS — J449 Chronic obstructive pulmonary disease, unspecified: Secondary | ICD-10-CM | POA: Diagnosis not present

## 2024-08-05 DIAGNOSIS — L304 Erythema intertrigo: Secondary | ICD-10-CM

## 2024-08-05 DIAGNOSIS — E119 Type 2 diabetes mellitus without complications: Secondary | ICD-10-CM | POA: Diagnosis not present

## 2024-08-05 MED ORDER — NYSTATIN 100000 UNIT/GM EX POWD: 1.0000 | Freq: Three times a day (TID) | CUTANEOUS | 3 refills | Status: DC

## 2024-08-05 MED ORDER — HYDROCODONE-ACETAMINOPHEN 10-325 MG PO TABS: 1.0000 | ORAL_TABLET | Freq: Two times a day (BID) | ORAL | 0 refills | Status: DC

## 2024-08-05 NOTE — Telephone Encounter (Signed)
 Copied from CRM #[id number]. Topic: Clinical - Red Word Triage >> Aug 05, 2024 11:08 AM Montie POUR wrote: Red Word that prompted transfer to Nurse Triage:  Last night she started hallucinating again. She is continues to hallucinates today. She is seeing 16-17 teenagers and around 16 babies. The teenagers are taking care of the babies. None of them are bothering her at this time. >> Aug 05, 2024 11:55 AM Treva T wrote: Patient daughter, Jon Dy, calling to request call is returned to her phone number of [phone], which is also listed for a contact number for patient.

## 2024-08-05 NOTE — Telephone Encounter (Signed)
 Take to ER. If do not go to ER come in tomorrow. Ok for double book. Need to run UA.

## 2024-08-05 NOTE — Telephone Encounter (Signed)
 FYI Only or Action Required?: FYI only for provider.  Patient was last seen in primary care on 06/18/2024 by Antoniette Vermell CROME, PA-C.  Called Nurse Triage reporting Hallucinations and Urinary Frequency.  Symptoms began several months ago.  Interventions attempted: Other: endorses PCP is aware and has gone to ED.  Symptoms are: gradually worsening.  Triage Disposition: See Physician Within 24 Hours  Patient/caregiver understands and will follow disposition?: Yes         Copied from CRM #[id number]. Topic: Clinical - Red Word Triage >> Aug 05, 2024 11:08 AM Montie POUR wrote: Red Word that prompted transfer to Nurse Triage:  Last night she started hallucinating again. She is continues to hallucinates today. She is seeing 16-17 teenagers and around 16 babies. The teenagers are taking care of the babies. None of them are bothering her at this time. Reason for Disposition  [1] Hallucinations AND [2] NEW-ONSET  Answer Assessment - Initial Assessment Questions 1. SYMPTOM: What's the main symptom you're concerned about? (e.g., frequency, incontinence)     I think I have a UTI - endorses urinary frequency/burning Endorses rash/burning in folds of skin around vagina and stomach and breasts - requesting powder 2. ONSET: When did the  sx  start?     Ongoing for 2 years 3. PAIN: Is there any pain? If Yes, ask: How bad is it? (Scale: 1-10; mild, moderate, severe)     Burning pain 4. CAUSE: What do you think is causing the symptoms?     UTI/yeast infection 5. OTHER SYMPTOMS: Do you have any other symptoms? (e.g., blood in urine, fever, flank pain, pain with urination)     Hallucinations - x 1-2 months ago. Endorses seeing 10+ teenagers/babies in her house-- reports called police and police did not find any intruders. Endorses hallucinations come and go. 6. PREGNANCY: Is there any chance you are pregnant? When was your last menstrual period?     N/a  Answer Assessment -  Initial Assessment Questions 1. MAIN CONCERN: What happened that made you call today?     Thinks may be related to frequent UTI but unsure since it has been ongoing 2. DESCRIPTION: What are the hallucinations like? Describe them for me. (e.g., auditory, visual, tactile; worsening; threatening) If auditory, ask Are they telling you to hurt yourself or someone else?     Visual, auditory 3. ONSET: When did this start? (e.g., hours, days, months)     1-*2 months 4. PATTERN: Do they come and go, or are they present all the time?  Are they present now?     Comes and goes 5. PRIOR HALLUCINATIONS: Have you had hallucinations in the past? (e.g., same, different, worse)     Around the same, endorses 10+ teens, denies harm to self 6. MENTAL HEALTH HISTORY: Have you ever been diagnosed with schizophrenia or any other mental health problem? (e.g., bipolar disorder, schizophrenia; dementia, Parkinsons)     PCP aware of hallucinations and wanted to refer to Neuro - has not seen specialist  7. MENTAL HEALTH MEDICINES: Are you taking any medicines for depression or other mental health problems? (e.g., psychiatric medicines; sleeping pills)     *No Answer* 8. ALCOHOL  or DRUG ABUSE: Have you been drinking alcohol  or taking any drugs? Have you recently stopped drinking alcohol  or using drugs?     *No Answer* 9. MEDICINE CHANGES: Did you recently stop taking a medicine? (e.g., antidepressant, barbiturates, benzodiazepine, gabapentin )  Did you recently start a new medicine or increase the dose? (  e.g., antidepressant, digoxin, sleep medicine, steroid, Parkinson's medicine)     *No Answer* 10. SUPPORT: Is there anyone else with you?       Lives with daughter 66. STRESS: Are you experiencing any particular stressors?       *No Answer* 12. OTHER SYMPTOMS: Are there any other symptoms? (e.g., difficulty breathing, headache, fever, weakness)       *No Answer*  Protocols used:  Hallucinations-A-AH, Urinary Symptoms-A-AH

## 2024-08-05 NOTE — Telephone Encounter (Signed)
 Copied from CRM #[id number]. Topic: Clinical - Medication Refill >> Aug 05, 2024 10:56 AM Montie POUR wrote: Medication:  HYDROcodone -acetaminophen  (NORCO) 10-325 MG tablet   Has the patient contacted their pharmacy? Yes (Agent: If no, request that the patient contact the pharmacy for the refill. If patient does not wish to contact the pharmacy document the reason why and proceed with request.) (Agent: If yes, when and what did the pharmacy advise?) Pharmacy needs order to refill  This is the patient's preferred pharmacy:  [id number] - [place], [place] - [address] AT [facility] [address] [place] [address] Phone: [phone] Fax: [phone]  Is this the correct pharmacy for this prescription? Yes If no, delete pharmacy and type the correct one.   Has the prescription been filled recently? No  Is the patient out of the medication? Yes -   Has the patient been seen for an appointment in the last year OR does the patient have an upcoming appointment? Yes  Can we respond through MyChart? Yes  Agent: Please be advised that Rx refills may take up to 3 business days. We ask that you follow-up with your pharmacy.

## 2024-08-05 NOTE — Telephone Encounter (Signed)
 Patient requesting rx rf of nystatin  powder Last written 04/01/225 Last OV 06/18/2024 Upcoming appt =08/06/2024  Will require PA if refilled per patient.

## 2024-08-05 NOTE — Telephone Encounter (Signed)
 Patient daughter Jon informed and will take the patient to the ER .

## 2024-08-05 NOTE — Telephone Encounter (Signed)
 Requesting rx rf of hydrocodone  - acet 10-325 Last written 06/07/2024 Last OV 06/18/2024 Upcoming appt 09/17/2024

## 2024-08-05 NOTE — Telephone Encounter (Signed)
 Spoke with patient daughter- states her mother's phone not working currently - requested that we add her phone # [phone] as main number for the time being  I did move this number up as primary number for patient in chart.

## 2024-08-05 NOTE — Telephone Encounter (Unsigned)
 Copied from CRM #[id number]. Topic: Clinical - Red Word Triage >> Aug 05, 2024 11:08 AM Montie POUR wrote: Red Word that prompted transfer to Nurse Triage:  Last night she started hallucinating again. She is continues to hallucinates today. She is seeing 16-17 teenagers and around 16 babies. The teenagers are taking care of the babies. None of them are bothering her at this time. >> Aug 05, 2024 11:55 AM Treva T wrote: Patient daughter, Jon Dy, calling to request call is returned to her phone number of [phone], which is also listed for a contact number for patient.

## 2024-08-05 NOTE — Telephone Encounter (Signed)
 Copied from CRM #[id number]. Topic: Clinical - Medication Prior Auth >> Aug 05, 2024 11:03 AM Montie POUR wrote: Reason for CRM:  Please start prior authorization for nystatin  (MYCOSTATIN /NYSTOP ) powder. Please send in notes stating why she needs medication. Please send medication to [facility].

## 2024-08-06 ENCOUNTER — Ambulatory Visit: Admitting: Physician Assistant

## 2024-08-06 NOTE — Telephone Encounter (Signed)
 Patient daughter was instructed yesterday 08/05/2024 to take the patient to the ER for evaluation due to hallucinations.  Vermell Bologna, PA concerned over possible UTI progressing to kidney damage or  sepsis.

## 2024-08-27 ENCOUNTER — Telehealth: Payer: Self-pay

## 2024-08-27 NOTE — Telephone Encounter (Signed)
 Copied from CRM [id number]. Topic: General - Other >> Aug 27, 2024  9:48 AM Selinda RAMAN wrote: Reason for CRM: Jon the daughter of the patient just wanted to pass some information on to the patients provider. She said her mother was transported last night by EMS to [facility] in [place] for a very bad yeast infection. While there they did a CT on her Abdomen and found something that may require surgery. She has already met with a surgeon and is awaiting the next steps. She was due for a Dexa Scan tomorrow but Jon will cancel that for her.

## 2024-08-28 ENCOUNTER — Other Ambulatory Visit

## 2024-09-02 ENCOUNTER — Telehealth: Payer: Self-pay

## 2024-09-02 ENCOUNTER — Telehealth: Payer: Self-pay | Admitting: Pharmacist

## 2024-09-02 DIAGNOSIS — E1142 Type 2 diabetes mellitus with diabetic polyneuropathy: Secondary | ICD-10-CM

## 2024-09-02 NOTE — Transitions of Care (Post Inpatient/ED Visit) (Signed)
   09/02/2024  Name: [name] Rice MRN: [id number] DOB: Oct 05, 1956  Today's TOC FU Call Status: Today's TOC FU Call Status:: Unsuccessful Call (1st Attempt) Unsuccessful Call (1st Attempt) Date: 09/02/24  Attempted to reach the patient regarding the most recent Inpatient/ED visit.  Follow Up Plan: Additional outreach attempts will be made to reach the patient to complete the Transitions of Care (Post Inpatient/ED visit) call.   Shona Prow RN, CCM [place]  VBCI-Population Health RN Care Manager [phone]

## 2024-09-02 NOTE — Progress Notes (Unsigned)
 Pharmacy Quality Measure Review  This patient is appearing on the insurance-providing list for being at risk of failing the adherence measure for Statin Use in Persons with Diabetes (SUPD) medications this calendar year.   Medication: pravastatin  10 mg daily  Last fill date: 2023  Will collaborate with PCP to refill pravastatin .   Catie IVAR Centers, PharmD, [facility] Clinical Pharmacist [phone]

## 2024-09-03 ENCOUNTER — Ambulatory Visit (INDEPENDENT_AMBULATORY_CARE_PROVIDER_SITE_OTHER): Admitting: Physician Assistant

## 2024-09-03 ENCOUNTER — Telehealth: Payer: Self-pay

## 2024-09-03 VITALS — BP 122/56 | HR 67 | Ht 63.6 in | Wt 207.0 lb

## 2024-09-03 DIAGNOSIS — Z09 Encounter for follow-up examination after completed treatment for conditions other than malignant neoplasm: Secondary | ICD-10-CM | POA: Diagnosis not present

## 2024-09-03 DIAGNOSIS — Z8719 Personal history of other diseases of the digestive system: Secondary | ICD-10-CM

## 2024-09-03 DIAGNOSIS — M793 Panniculitis, unspecified: Secondary | ICD-10-CM | POA: Diagnosis not present

## 2024-09-03 MED ORDER — PRAVASTATIN SODIUM 10 MG PO TABS: 10.0000 mg | ORAL_TABLET | Freq: Every day | ORAL | 3 refills | Status: AC

## 2024-09-03 NOTE — Patient Instructions (Signed)
 Visit Information  Thank you for taking time to visit with me today. Please don't hesitate to contact me if I can be of assistance to you before our next scheduled telephone appointment.  Our next appointment is by telephone on 09/11/24 in the afternoon  Following is a copy of your care plan:   Goals Addressed             This Visit's Progress    VBCI Transitions of Care (TOC) Care Plan       Problems:  Recent Hospitalization for treatment of Admit/Discharge Date: 11/10 - 11/14 [facility] Primary Diagnosis: Panniculitis  Functional/Safety concern: Patient voiced that she has a shower bench that she puts over her bath and helps her to bathe and daughter assists as well and states she has a wheelchair and received cushion for wheelchair during hospitalization  and SDOH barrier: TOC RN noted that prior to call it was documented that patient had housing and utility insecurities of which patient denied both during today's call  Unable to complete all assessment questions - Call lasted 1 hour - EPIC froze and required assist from IT - will review in future call  Goal:  Over the next 30 days, the patient will not experience hospital readmission  Interventions:   Diabetes Interventions: Assessed patient's understanding of A1c goal: <6.5% Provided education to patient about basic DM disease process Reviewed medications with patient and discussed importance of medication adherence Discussed plans with patient for ongoing care management follow up and provided patient with direct contact information for care management team Assessed social determinant of health barriers Discussed with patient importance of checking sugar after patient stated, I don't check it like I should - patient has Freestyle Libre 3 but was not applied at time of call - [facility] RN attempted to engage patient on ways to get face to face training on Freestyle libre after patient stated a family member put it on but it fell  off. Patient then states she has a card that tells her how to apply via video but she hasn't watched it yet - We discussed prioritizing her health and making time to do things she needs to do to keep her healthy - patient reports she started on Ozempic  around a year ago and states she has lost 77lbs  - reports starting weight was 284lbs and states she is 5/3 Lab Results  Component Value Date   HGBA1C 6.6 (A) 06/18/2024    Patient Self Care Activities:  Attend all scheduled provider appointments Call pharmacy for medication refills 3-7 days in advance of running out of medications Call provider office for new concerns or questions  Notify RN Care Manager of TOC call rescheduling needs Participate in Transition of Care Program/Attend TOC scheduled calls Take medications as prescribed   check feet daily for cuts, sores or redness enter blood sugar readings and medication or insulin  into daily log take the blood sugar log to all doctor visits manage portion size wash and dry feet carefully every day wear comfortable, cotton socks wear comfortable, well-fitting shoes Watch video to learn and then apply Freestyle Libre 3 to closely monitor sugar and help manage diabetes Monitor skin and report worsening - Patient states the biggest concern is to the right of her abdomen and states it looks like yeast and that it is already improved with new medication   Plan:  Telephone follow up appointment with care management team member scheduled for:  11/26/25pm The patient has been provided with  contact information for the care management team and has been advised to call with any health related questions or concerns.         Patient verbalizes understanding of instructions and care plan provided today and agrees to view in MyChart. Active MyChart status and patient understanding of how to access instructions and care plan via MyChart confirmed with patient.     Telephone follow up appointment  with care management team member scheduled for: 09/11/24 The patient has been provided with contact information for the care management team and has been advised to call with any health related questions or concerns.   Please call the care guide team at [phone] if you need to cancel or reschedule your appointment.   Please call the Suicide and Crisis Lifeline: 988 call 1-800-273-TALK (toll free, 24 hour hotline) call 911 if you are experiencing a Mental Health or Behavioral Health Crisis or need someone to talk to.  Shona Prow RN, CCM [place]  VBCI-Population Health RN Care Manager [phone]

## 2024-09-03 NOTE — Telephone Encounter (Signed)
 I resent to pharmacy for patient to start.

## 2024-09-03 NOTE — Progress Notes (Unsigned)
 Established Patient Office Visit  Subjective   Patient ID: [name] Rice, female    DOB: 08-Dec-1955  Age: 68 y.o. MRN: [id number]  Chief Complaint  Patient presents with   Hospitalization Follow-up    HPI .SABRADiscussed the use of AI scribe software for clinical note transcription with the patient, who gave verbal consent to proceed.  History of Present Illness [name] Rice is a 68 year old female with panninculitis who presents for follow-up after hospitalization.  Abdominal pain and diverticulitis - Hospitalized from August 26, 2024 to August 30, 2024 for severe abdominal pain secondary to panniculitis but was some suspicion of diverticulitis - CT scan during hospitalization showed wall thickening and stranding in the descending and sigmoid colon with a questionable small intramural abscess (1.9 x 1 cm) - Experienced severe pain during hospitalization, unable to roll on either side or lay on her back, and was crying due to pain severity - General Surgery Consult was made and did not think CT was reading accurately - No antibiotic treatment was initiated.  - Significant improvement in symptoms with treatment of panniculitis.  - No current abdominal tenderness, but persistent localized area described as a 'really bad' spot on her belly - Not currently on antibiotics  Cutaneous  candidiasis - Currently experiencing a yeast infection, which is improving - Using clotrimazole  1% cream applied to the affected area twice daily - Using Vaseline-type ointment and Desenex powder for symptomatic relief - Prescribed fluconazole  200 mg orally daily for five days; two doses remaining, taking daily - No longer using nystatin  powder    ROS See HPI.    Objective:     BP (!) 122/56   Pulse 67   Ht 5' 3.6 (1.615 m)   Wt 207 lb (93.9 kg)   LMP 10/18/1995   SpO2 99%   BMI 35.98 kg/m  BP Readings from Last 3 Encounters:  09/03/24 (!) 122/56  06/18/24 130/65  05/30/24 (!) 164/84    Wt Readings from Last 3 Encounters:  09/03/24 207 lb (93.9 kg)  05/30/24 208 lb (94.3 kg)  04/22/24 226 lb (102.5 kg)      Physical Exam Constitutional:      Appearance: Normal appearance. She is obese.  HENT:     Head: Normocephalic.  Cardiovascular:     Rate and Rhythm: Normal rate and regular rhythm.     Heart sounds: Murmur heard.  Pulmonary:     Effort: Pulmonary effort is normal.     Breath sounds: Normal breath sounds.  Abdominal:     General: There is no distension.     Palpations: Abdomen is soft. There is no mass.     Tenderness: There is abdominal tenderness. There is no right CVA tenderness, left CVA tenderness, guarding or rebound.     Comments: Area around umbilicus and under skin fold moist and slightly erythematous.   Musculoskeletal:     Right lower leg: No edema.     Left lower leg: No edema.  Neurological:     General: No focal deficit present.     Mental Status: She is oriented to person, place, and time.  Psychiatric:        Mood and Affect: Mood normal.      The 10-year ASCVD risk score (Arnett DK, et al., 2019) is: 20.9%    Assessment & Plan:  SABRASABRATandi was seen today for hospitalization follow-up.  Diagnoses and all orders for this visit:  Hospital discharge follow-up -  CMP14+EGFR -     CBC w/Diff/Platelet  Panniculitis  History of diverticulitis   Assessment & Plan Hx of diverticulitis and suspected diverticulitis in hospital but determined it was only panniculitis causing the symptoms - no antibiotic therapy given for diverticulitis in hospital.  - recheck CBC and CMP.  Candidiasis of skin and subcutaneous tissue Improvement with clotrimazole  cream and fluconazole . Desenex powder used for moisture management. - Continue clotrimazole  cream twice daily. - Continue fluconazole  200 mg orally daily for five days. - Use Desenex powder to manage moisture and prevent recurrence. - Recheck CMP/CBC.   Type 2 diabetes mellitus with  diabetic neuropathy Diabetes management stable with A1c within target range.  Hypertension associated with diabetes Blood pressure well-controlled with current medication. Diastolic pressure slightly low but acceptable. - Continue current antihypertensive medication regimen.   Return if symptoms worsen or fail to improve.    [name] Donahoo, PA-C

## 2024-09-03 NOTE — Transitions of Care (Post Inpatient/ED Visit) (Signed)
 09/03/2024  Name: [name] Rice MRN: [id number] DOB: 03-Jul-1956  Today's TOC FU Call Status: Today's TOC FU Call Status:: Successful TOC FU Call Completed TOC FU Call Complete Date: 09/03/24  Patient's Name and Date of Birth confirmed. DOB, Name  Transition Care Management Follow-up Telephone Call How have you been since you were released from the hospital?: Better Any questions or concerns?: No  Items Reviewed: Did you receive and understand the discharge instructions provided?: Yes Medications obtained,verified, and reconciled?: Yes (Medications Reviewed) Any new allergies since your discharge?: No Dietary orders reviewed?: Yes Type of Diet Ordered:: diabetic Do you have support at home?: Yes People in Home [RPT]: child(ren), adult Name of Support/Comfort Primary Source: daughter  Medications Reviewed Today: Medications Reviewed Today     Reviewed by Lauro Shona LABOR, RN (Registered Nurse) on 09/03/24 at 1537  Med List Status: <None>   Medication Order Taking? Sig Documenting Provider Last Dose Status Informant  Accu-Chek Softclix Lancets lancets [id number] Yes Use as needed for glucose check due to T2DM  up to 4 times a day. Breeback, Jade L, PA-C  Active   Alcohol  Swabs (B-D SINGLE USE SWABS REGULAR) PADS [id number] Yes 1 Device by Does not apply route 4 (four) times daily as needed. Antoniette Vermell CROME, PA-C  Active   AMBULATORY NON FORMULARY MEDICATION [id number] Yes Walk in shower needed in apartment due to mobility issues and high fall risk.  Patient taking differently: Walk in shower needed in apartment due to mobility issues and high fall risk. Patient states she has shower bench that helps her   Breeback, Jade L, PA-C  Active   azelastine  (OPTIVAR ) 0.05 % ophthalmic solution [id number]  INSTILL 1 DROP INTO BOTH EYES TWICE DAILY  Patient not taking: Reported on 09/03/2024   Breeback, Jade L, PA-C  Active   Blood Glucose Calibration (ACCU-CHEK GUIDE CONTROL) LIQD [id number]  Yes 1 each by In Vitro route as directed. Breeback, Jade L, PA-C  Active   Blood Glucose Monitoring Suppl (ACCU-CHEK GUIDE ME) w/Device KIT [id number] Yes 1 each by Does not apply route as directed. Breeback, Jade L, PA-C  Active   Calcium Carb-Cholecalciferol  (CALCIUM 500 +D PO) [id number] Yes Take by mouth. [provider]  Active   Cholecalciferol  (VITAMIN D ) 50 MCG (2000 UT) CAPS [id number]  Take 2,000 Units by mouth daily.  Patient not taking: Reported on 09/03/2024   [provider]  Active   clotrimazole  (LOTRIMIN ) 1 % cream [id number] Yes Apply 1 Application topically 2 (two) times daily. [provider]  Active   Continuous Glucose Receiver (FREESTYLE LIBRE 3 READER) DEVI [id number] Yes Use as directed to check blood sugars Breeback, Jade L, PA-C  Active   Continuous Glucose Sensor (FREESTYLE LIBRE 3 PLUS SENSOR) MISC [id number] Yes Change sensor every 15 days. Breeback, Jade L, PA-C  Active   dicyclomine  (BENTYL ) 20 MG tablet [id number]  Take 1 tablet (20 mg total) by mouth 2 (two) times daily as needed for spasms.  Patient not taking: Reported on 09/03/2024   Teddy Sharper, FNP  Active   DROPLET PEN NEEDLES 32G X 6 MM MISC [id number] Yes USE THREE TIMES DAILY  WITH  NOVOLOG  Breeback, Jade L, PA-C  Active   DULoxetine  (CYMBALTA ) 60 MG capsule [id number] Yes Take 2 capsules (120 mg total) by mouth daily.  Patient taking differently: Take 60 mg by mouth daily. 08/30/24 hospital discharge from [facility] states 60mg  2 times a day   Sun, Jade L, PA-C  Active  famotidine  (PEPCID ) 20 MG tablet [id number] Yes TAKE 1 TABLET BY MOUTH TWICE DAILY. Antoniette Vermell CROME, PA-C  Active   fluconazole  (DIFLUCAN ) 200 MG tablet [id number] Yes Take 200 mg by mouth daily. [provider]  Active   fluticasone  (FLONASE ) 50 MCG/ACT nasal spray [id number] Yes SHAKE LIQUID AND USE 2 SPRAYS IN EACH NOSTRIL DAILY Breeback, Jade L, PA-C  Active   furosemide  (LASIX ) 20 MG tablet  [id number] Yes TAKE 1 TABLET TWICE DAILY  Patient taking differently: Take 20 mg by mouth daily. TAKE 1 TABLET TWICE DAILY   Breeback, Jade L, PA-C  Active   gabapentin  (NEURONTIN ) 600 MG tablet [id number] Yes TAKE 1 TABLET BY MOUTH THREE TIMES DAILY  Patient taking differently: Take 600 mg by mouth 3 (three) times daily. Discharge from [facility] 08/30/24 states 800mg  3x/day   Breeback, Jade L, PA-C  Active   HYDROcodone -acetaminophen  (NORCO) 10-325 MG tablet [id number] Yes Take 1 tablet by mouth every 12 (twelve) hours. As needed for moderate or severe pain. Antoniette Vermell CROME, PA-C  Active   hydrOXYzine  (ATARAX ) 50 MG tablet [id number]  Take 1 tablet (50 mg total) by mouth every 8 (eight) hours as needed. For itching.  Patient not taking: Reported on 09/03/2024   Antoniette Vermell CROME, PA-C  Active   insulin  glargine, 2 Unit Dial, (TOUJEO  MAX SOLOSTAR) 300 UNIT/ML Solostar Pen [id number] Yes Inject 40 Units into the skin at bedtime. Breeback, Jade L, PA-C  Active   ipratropium (ATROVENT ) 0.06 % nasal spray [id number]  Place 2 sprays into both nostrils 4 (four) times daily.  Patient not taking: Reported on 09/03/2024   Breeback, Jade L, PA-C  Active   levocetirizine (XYZAL ) 5 MG tablet [id number] Yes TAKE 1 TABLET BY MOUTH EVERY EVENING Breeback, Jade L, PA-C  Active   losartan  (COZAAR ) 25 MG tablet [id number] Yes TAKE 1 TABLET EVERY DAY Breeback, Jade L, PA-C  Active   magnesium oxide (MAG-OX) 400 (240 Mg) MG tablet [id number] Yes Take 400 mg by mouth daily. [provider]  Active   meclizine  (ANTIVERT ) 25 MG tablet [id number] Yes Take 1 tablet (25 mg total) by mouth 3 (three) times daily as needed for dizziness.  Patient taking differently: Take 25 mg by mouth daily as needed for dizziness (08/30/24 [place] [facility] [place] discharge - once daily as needed).   Antoniette Vermell CROME, PA-C  Active   metoCLOPramide  (REGLAN ) 10 MG tablet [id number] Yes Take 0.5 tablets (5 mg total) by mouth 3 (three)  times daily before meals. Breeback, Jade L, PA-C  Active   mirabegron  ER (MYRBETRIQ ) 25 MG TB24 tablet [id number] Yes Take 1 tablet (25 mg total) by mouth daily. Antoniette Vermell CROME, PA-C  Active   Misc. Devices [facility]) MISC [id number] Yes 1 Device by Does not apply route daily. Antoniette Vermell CROME, PA-C  Active   montelukast  (SINGULAIR ) 10 MG tablet [id number] Yes TAKE 1 TABLET AT BEDTIME Breeback, Jade L, PA-C  Active   nystatin  (MYCOSTATIN /NYSTOP ) powder [id number]  Apply 1 Application topically 3 (three) times daily.  Patient not taking: Reported on 09/03/2024   Dreama, [place]  N, FNP  Active   nystatin  (MYCOSTATIN /NYSTOP ) powder [id number]  Apply 1 Application topically 3 (three) times daily.  Patient not taking: Reported on 09/03/2024   Breeback, Jade L, PA-C  Active   nystatin -triamcinolone  ointment [facility]) [id number]  Apply 1 Application topically 2 (two) times daily.  Patient not taking: Reported on 09/03/2024   Antoniette Vermell CROME, PA-C  Active  pantoprazole  (PROTONIX ) 40 MG tablet [id number] Yes TAKE 1 TABLET BY MOUTH DAILY Breeback, Jade L, PA-C  Active   pioglitazone  (ACTOS ) 30 MG tablet [id number] Yes TAKE 1 TABLET EVERY DAY Breeback, Jade L, PA-C  Active   pravastatin  (PRAVACHOL ) 10 MG tablet [id number] Yes Take 1 tablet (10 mg total) by mouth daily. Breeback, Jade L, PA-C  Active   propranolol  (INDERAL ) 20 MG tablet [id number] Yes TAKE 1TABLET BY MOUTH TWICE DAILY Breeback, Jade L, PA-C  Active   Semaglutide , 1 MG/DOSE, (OZEMPIC , 1 MG/DOSE,) 4 MG/3ML SOPN [id number] Yes INJECT 1MG  INTO THE SKIN ONCE A WEEK Breeback, Jade L, PA-C  Active   vitamin B-12 (CYANOCOBALAMIN ) 1000 MCG tablet [id number]  Take 1 tablet (1,000 mcg total) by mouth daily.  Patient not taking: Reported on 09/03/2024   Antoniette Vermell CROME, PA-C  Active             Home Care and Equipment/Supplies:    Functional Questionnaire: Do you need assistance with bathing/showering or dressing?: Yes (daughter assists  as needed) Do you need assistance with meal preparation?: No Do you need assistance with eating?: No Do you have difficulty maintaining continence: Yes (patient states she wears pads) Do you need assistance with getting out of bed/getting out of a chair/moving?: No Do you have difficulty managing or taking your medications?: No  Follow up appointments reviewed: PCP Follow-up appointment confirmed?: Yes Date of PCP follow-up appointment?: 09/03/24 Follow-up Provider: PCP appointment completed today Specialist Hospital Follow-up appointment confirmed?: NA Do you need transportation to your follow-up appointment?: No (daughter drives patient to appts) Do you understand care options if your condition(s) worsen?: Yes-patient verbalized understanding  SDOH Interventions Today    Flowsheet Row Most Recent Value  SDOH Interventions   Food Insecurity Interventions Intervention Not Indicated  Housing Interventions Intervention Not Indicated  Transportation Interventions Intervention Not Indicated  Utilities Interventions Intervention Not Indicated    Goals Addressed             This Visit's Progress    VBCI Transitions of Care (TOC) Care Plan       Problems:  Recent Hospitalization for treatment of Admit/Discharge Date: 11/10 - 11/14 [facility] Primary Diagnosis: Panniculitis  Functional/Safety concern: Patient voiced that she has a shower bench that she puts over her bath and helps her to bathe and daughter assists as well and states she has a wheelchair and received cushion for wheelchair during hospitalization  and SDOH barrier: TOC RN noted that prior to call it was documented that patient had housing and utility insecurities of which patient denied both during today's call  Unable to complete all assessment questions - Call lasted 1 hour - EPIC froze and required assist from IT - will review in future call  Goal:  Over the next 30 days, the patient will not experience hospital  readmission  Interventions:   Diabetes Interventions: Assessed patient's understanding of A1c goal: <6.5% Provided education to patient about basic DM disease process Reviewed medications with patient and discussed importance of medication adherence Discussed plans with patient for ongoing care management follow up and provided patient with direct contact information for care management team Assessed social determinant of health barriers Discussed with patient importance of checking sugar after patient stated, I don't check it like I should - patient has Freestyle Libre 3 but was not applied at time of call - [facility] RN attempted to engage patient on ways to get face to face training on [facility] after patient  stated a family member put it on but it fell off. Patient then states she has a card that tells her how to apply via video but she hasn't watched it yet - We discussed prioritizing her health and making time to do things she needs to do to keep her healthy - patient reports she started on Ozempic  around a year ago and states she has lost 77lbs  - reports starting weight was 284lbs and states she is 5/3 Lab Results  Component Value Date   HGBA1C 6.6 (A) 06/18/2024    Patient Self Care Activities:  Attend all scheduled provider appointments Call pharmacy for medication refills 3-7 days in advance of running out of medications Call provider office for new concerns or questions  Notify RN Care Manager of TOC call rescheduling needs Participate in Transition of Care Program/Attend TOC scheduled calls Take medications as prescribed   check feet daily for cuts, sores or redness enter blood sugar readings and medication or insulin  into daily log take the blood sugar log to all doctor visits manage portion size wash and dry feet carefully every day wear comfortable, cotton socks wear comfortable, well-fitting shoes Watch video to learn and then apply Freestyle Libre 3 to closely  monitor sugar and help manage diabetes Monitor skin and report worsening - Patient states the biggest concern is to the right of her abdomen and states it looks like yeast and that it is already improved with new medication   Plan:  Telephone follow up appointment with care management team member scheduled for:  11/26/25pm The patient has been provided with contact information for the care management team and has been advised to call with any health related questions or concerns.         Shona Prow RN, CCM [place]  VBCI-Population Health RN Care Manager [phone]

## 2024-09-04 ENCOUNTER — Ambulatory Visit: Payer: Self-pay | Admitting: Physician Assistant

## 2024-09-04 ENCOUNTER — Encounter: Payer: Self-pay | Admitting: Physician Assistant

## 2024-09-04 DIAGNOSIS — Z8719 Personal history of other diseases of the digestive system: Secondary | ICD-10-CM | POA: Insufficient documentation

## 2024-09-04 LAB — CBC WITH DIFFERENTIAL/PLATELET
Basophils Absolute: 0.1 x10E3/uL (ref 0.0–0.2)
Basos: 1 %
EOS (ABSOLUTE): 0.4 x10E3/uL (ref 0.0–0.4)
Eos: 4 %
Hematocrit: 38.6 % (ref 34.0–46.6)
Hemoglobin: 12.3 g/dL (ref 11.1–15.9)
Immature Grans (Abs): 0.1 x10E3/uL (ref 0.0–0.1)
Immature Granulocytes: 1 %
Lymphocytes Absolute: 2.2 x10E3/uL (ref 0.7–3.1)
Lymphs: 23 %
MCH: 30.4 pg (ref 26.6–33.0)
MCHC: 31.9 g/dL (ref 31.5–35.7)
MCV: 96 fL (ref 79–97)
Monocytes Absolute: 0.4 x10E3/uL (ref 0.1–0.9)
Monocytes: 5 %
Neutrophils Absolute: 6.3 x10E3/uL (ref 1.4–7.0)
Neutrophils: 66 %
Platelets: 299 x10E3/uL (ref 150–450)
RBC: 4.04 x10E6/uL (ref 3.77–5.28)
RDW: 13.9 % (ref 11.7–15.4)
WBC: 9.4 x10E3/uL (ref 3.4–10.8)

## 2024-09-04 LAB — CMP14+EGFR
ALT: 13 IU/L (ref 0–32)
AST: 30 IU/L (ref 0–40)
Albumin: 3.5 g/dL — ABNORMAL LOW (ref 3.9–4.9)
Alkaline Phosphatase: 91 IU/L (ref 49–135)
BUN/Creatinine Ratio: 26 (ref 12–28)
BUN: 26 mg/dL (ref 8–27)
Bilirubin Total: 0.2 mg/dL (ref 0.0–1.2)
CO2: 26 mmol/L (ref 20–29)
Calcium: 8.7 mg/dL (ref 8.7–10.3)
Chloride: 100 mmol/L (ref 96–106)
Creatinine, Ser: 1.01 mg/dL — ABNORMAL HIGH (ref 0.57–1.00)
Globulin, Total: 2.9 g/dL (ref 1.5–4.5)
Glucose: 155 mg/dL — ABNORMAL HIGH (ref 70–99)
Potassium: 4.8 mmol/L (ref 3.5–5.2)
Sodium: 139 mmol/L (ref 134–144)
Total Protein: 6.4 g/dL (ref 6.0–8.5)
eGFR: 60 mL/min/1.73 (ref >59)

## 2024-09-04 NOTE — Progress Notes (Signed)
 [name],   Kidney function a llittle better. Stay hydrated. Kidneys still look a little dry.  WBC looks good.   It does not look like you are feeling your cholesterol medication, lovastatin, I sent refills for you to restart to decrease LDL and overall cardiovascular risk.   [name] Rice

## 2024-09-17 ENCOUNTER — Telehealth: Payer: Self-pay

## 2024-09-17 ENCOUNTER — Ambulatory Visit: Admitting: Physician Assistant

## 2024-09-17 VITALS — BP 119/85 | HR 88 | Wt 202.0 lb

## 2024-09-17 DIAGNOSIS — E1140 Type 2 diabetes mellitus with diabetic neuropathy, unspecified: Secondary | ICD-10-CM | POA: Diagnosis not present

## 2024-09-17 DIAGNOSIS — R29898 Other symptoms and signs involving the musculoskeletal system: Secondary | ICD-10-CM

## 2024-09-17 DIAGNOSIS — Z794 Long term (current) use of insulin: Secondary | ICD-10-CM

## 2024-09-17 DIAGNOSIS — M5442 Lumbago with sciatica, left side: Secondary | ICD-10-CM | POA: Diagnosis not present

## 2024-09-17 DIAGNOSIS — M793 Panniculitis, unspecified: Secondary | ICD-10-CM | POA: Diagnosis not present

## 2024-09-17 DIAGNOSIS — E1142 Type 2 diabetes mellitus with diabetic polyneuropathy: Secondary | ICD-10-CM

## 2024-09-17 DIAGNOSIS — Z09 Encounter for follow-up examination after completed treatment for conditions other than malignant neoplasm: Secondary | ICD-10-CM

## 2024-09-17 LAB — POCT GLYCOSYLATED HEMOGLOBIN (HGB A1C): Hemoglobin A1C: 6 % — AB (ref 4.0–5.6)

## 2024-09-17 MED ORDER — TOUJEO MAX SOLOSTAR 300 UNIT/ML ~~LOC~~ SOPN: 40.0000 [IU] | PEN_INJECTOR | Freq: Every day | SUBCUTANEOUS | 0 refills | Status: AC

## 2024-09-17 MED ORDER — FREESTYLE LIBRE 3 PLUS SENSOR MISC: 0 refills | Status: AC

## 2024-09-17 MED ORDER — OZEMPIC (1 MG/DOSE) 4 MG/3ML ~~LOC~~ SOPN: PEN_INJECTOR | SUBCUTANEOUS | 0 refills | Status: AC

## 2024-09-17 NOTE — Transitions of Care (Post Inpatient/ED Visit) (Signed)
 Transition of Care week 2  Visit Note  09/17/2024  Name: [name] Rice MRN: [id number]          DOB: [birth year]  Situation: Patient enrolled in [facility] 30-day program. Visit completed with patient by telephone.   Background: Admit/Discharge Date: 11/10 - 11/14 [facility] Primary Diagnosis: Panniculitis  Initial Transition Care Management Follow-up Telephone Call Discharge Date and Diagnosis: 08/30/24, Panniculitis   Past Medical History:  Diagnosis Date   Allergy    Always allergic to cut grass   Anxiety    Arthritis    Asthma    no PFTs   Carpal tunnel syndrome, bilateral    Cataract 2020   Removed   Cervical dystonia    COPD (chronic obstructive pulmonary disease) (HCC)    no PFTs   Depression    Diabetes mellitus    Diarrhea    Diverticulitis    Diverticulosis    Dizziness    Gastroparesis    GERD (gastroesophageal reflux disease)    History of colonic polyps    hyperplastic   History of kidney stones    History of seborrheic dermatitis    face,ears   HLD (hyperlipidemia)    Hypertension    Intussusception (HCC)    Lisfranc's sprain, left, initial encounter 10/06/2017   Lumbar pain 2011   per x-ray - Multilevel spondylosis   Morbid obesity (HCC)    Nephrolithiasis    OSA on CPAP 2008   Osteoporosis 2018   Pneumonia    Recurrent boils    SLUSTER BOILS ON ABD AND LABIA   Sleep apnea    USES C-PAP   Torn rotator cuff RT SHOULDER   Tremor CCENTRAL NERVOUS SYSTEM    Assessment: Patient Reported Symptoms: Cognitive Cognitive Status: No symptoms reported, Normal speech and language skills, Alert and oriented to person, place, and time      Neurological Neurological Review of Symptoms: No symptoms reported    HEENT HEENT Symptoms Reported: Nasal discharge, Other:, Ear pain HEENT Comment: Patient will mention concerns to PCP at appt today    Cardiovascular Cardiovascular Symptoms Reported: No symptoms reported    Respiratory Respiratory Symptoms  Reported: Shortness of breath Other Respiratory Symptoms: patient reports shortness of breath with walking - does not require oxygen  - will discuss with PCP today    Endocrine Endocrine Symptoms Reported: No symptoms reported Is patient diabetic?: Yes Is patient checking blood sugars at home?: Yes List most recent blood sugar readings, include date and time of day: Patient reports sugar 09/16/24 136 and states was really good at the hospital  08/27/24 A1C 6.2    Gastrointestinal Gastrointestinal Symptoms Reported: No symptoms reported      Genitourinary      Integumentary Integumentary Symptoms Reported: Rash Additional Integumentary Details: patient states this has cleared - states has one spot on right side of abdomen rash    Musculoskeletal Musculoskelatal Symptoms Reviewed: Other Other Musculoskeletal Symptoms: Patient stays in wheelchair - states she had foot surgery 3 times - has been in wheelchair - has ramp and able to access bathroom   Falls in the past year?: Yes Number of falls in past year: 2 or more Was there an injury with Fall?: Yes Fall Risk Category Calculator: 3 Patient Fall Risk Level: High Fall Risk Patient at Risk for Falls Due to: History of fall(s), Impaired mobility, Impaired balance/gait  Psychosocial           There were no vitals filed for this visit. Pain Scale:  0-10 Pain Score: 0-No pain  Medications Reviewed Today     Reviewed by Lauro Shona LABOR, RN (Registered Nurse) on 09/17/24 at 1214  Med List Status: <None>   Medication Order Taking? Sig Documenting Provider Last Dose Status Informant  Accu-Chek Softclix Lancets lancets [id number] Yes Use as needed for glucose check due to T2DM  up to 4 times a day. Breeback, Jade L, PA-C  Active   Alcohol  Swabs (B-D SINGLE USE SWABS REGULAR) PADS [id number] Yes 1 Device by Does not apply route 4 (four) times daily as needed. Antoniette Vermell CROME, PA-C  Active   AMBULATORY NON FORMULARY MEDICATION [id number] Yes  Walk in shower needed in apartment due to mobility issues and high fall risk.  Patient taking differently: Walk in shower needed in apartment due to mobility issues and high fall risk. Patient states she has shower bench that helps her   Breeback, Jade L, PA-C  Active   azelastine  (OPTIVAR ) 0.05 % ophthalmic solution [id number]  INSTILL 1 DROP INTO BOTH EYES TWICE DAILY  Patient not taking: Reported on 09/17/2024   Breeback, Jade L, PA-C  Active   Blood Glucose Calibration (ACCU-CHEK GUIDE CONTROL) LIQD [id number] Yes 1 each by In Vitro route as directed. Breeback, Jade L, PA-C  Active   Blood Glucose Monitoring Suppl (ACCU-CHEK GUIDE ME) w/Device KIT [id number] Yes 1 each by Does not apply route as directed. Breeback, Jade L, PA-C  Active   Calcium Carb-Cholecalciferol  (CALCIUM 500 +D PO) [id number] Yes Take by mouth. [provider]  Active   Cholecalciferol  (VITAMIN D ) 50 MCG (2000 UT) CAPS [id number]  Take 2,000 Units by mouth daily.  Patient not taking: Reported on 09/17/2024   [provider]  Active   clotrimazole  (LOTRIMIN ) 1 % cream [id number]  Apply 1 Application topically 2 (two) times daily.  Patient not taking: Reported on 09/17/2024   [provider]  Active   Continuous Glucose Receiver (FREESTYLE LIBRE 3 READER) DEVI [id number]  Use as directed to check blood sugars  Patient not taking: Reported on 09/17/2024   Antoniette Vermell CROME, PA-C  Active   Continuous Glucose Sensor (FREESTYLE LIBRE 3 PLUS SENSOR) MISC [id number]  Change sensor every 15 days.  Patient not taking: Reported on 09/17/2024   Antoniette Vermell CROME, PA-C  Active   dicyclomine  (BENTYL ) 20 MG tablet [id number]  Take 1 tablet (20 mg total) by mouth 2 (two) times daily as needed for spasms.  Patient not taking: Reported on 09/17/2024   Teddy Sharper, FNP  Active   DROPLET PEN NEEDLES 32G X 6 MM MISC [id number] Yes USE THREE TIMES DAILY  WITH  NOVOLOG  Breeback, Jade L, PA-C  Active   DULoxetine  (CYMBALTA ) 60  MG capsule [id number] Yes Take 2 capsules (120 mg total) by mouth daily.  Patient taking differently: Take 60 mg by mouth daily. 08/30/24 hospital discharge from [facility] states 60mg  2 times a day   Breeback, Jade L, PA-C  Active   famotidine  (PEPCID ) 20 MG tablet [id number] Yes TAKE 1 TABLET BY MOUTH TWICE DAILY. Breeback, Jade L, PA-C  Active   fluticasone  (FLONASE ) 50 MCG/ACT nasal spray [id number] Yes SHAKE LIQUID AND USE 2 SPRAYS IN EACH NOSTRIL DAILY Breeback, Jade L, PA-C  Active   furosemide  (LASIX ) 20 MG tablet [id number] Yes TAKE 1 TABLET TWICE DAILY  Patient taking differently: Take 20 mg by mouth daily. TAKE 1 TABLET TWICE DAILY   Breeback, Jade L, PA-C  Active   gabapentin  (NEURONTIN ) 600 MG tablet  [id number] Yes TAKE 1 TABLET BY MOUTH THREE TIMES DAILY  Patient taking differently: Take 600 mg by mouth 3 (three) times daily. Discharge from [facility] 08/30/24 states 800mg  3x/day   Breeback, Jade L, PA-C  Active   HYDROcodone -acetaminophen  (NORCO) 10-325 MG tablet [id number] Yes Take 1 tablet by mouth every 12 (twelve) hours. As needed for moderate or severe pain. Breeback, Jade L, PA-C  Active   hydrOXYzine  (ATARAX ) 50 MG tablet [id number]  Take 1 tablet (50 mg total) by mouth every 8 (eight) hours as needed. For itching.  Patient not taking: Reported on 09/17/2024   Antoniette Vermell CROME, PA-C  Active   insulin  glargine, 2 Unit Dial, (TOUJEO  MAX SOLOSTAR) 300 UNIT/ML Solostar Pen [id number] Yes Inject 40 Units into the skin at bedtime. Breeback, Jade L, PA-C  Active   ipratropium (ATROVENT ) 0.06 % nasal spray [id number]  Place 2 sprays into both nostrils 4 (four) times daily.  Patient not taking: Reported on 09/17/2024   Breeback, Jade L, PA-C  Active   levocetirizine (XYZAL ) 5 MG tablet [id number] Yes TAKE 1 TABLET BY MOUTH EVERY EVENING Breeback, Jade L, PA-C  Active   losartan  (COZAAR ) 25 MG tablet [id number] Yes TAKE 1 TABLET EVERY DAY Breeback, Jade L, PA-C  Active   magnesium  oxide (MAG-OX) 400 (240 Mg) MG tablet [id number] Yes Take 400 mg by mouth daily. [provider]  Active   meclizine  (ANTIVERT ) 25 MG tablet [id number] Yes Take 1 tablet (25 mg total) by mouth 3 (three) times daily as needed for dizziness.  Patient taking differently: Take 25 mg by mouth daily as needed for dizziness (08/30/24 [facility] discharge - once daily as needed).   Antoniette Vermell CROME, PA-C  Active   metoCLOPramide  (REGLAN ) 10 MG tablet [id number] Yes Take 0.5 tablets (5 mg total) by mouth 3 (three) times daily before meals. Breeback, Jade L, PA-C  Active   mirabegron  ER (MYRBETRIQ ) 25 MG TB24 tablet [id number] Yes Take 1 tablet (25 mg total) by mouth daily. Antoniette Vermell CROME, PA-C  Active   Misc. Devices [facility]) MISC [id number] Yes 1 Device by Does not apply route daily. Breeback, Jade L, PA-C  Active   montelukast  (SINGULAIR ) 10 MG tablet [id number] Yes TAKE 1 TABLET AT BEDTIME Breeback, Jade L, PA-C  Active   nystatin  (MYCOSTATIN /NYSTOP ) powder [id number]  Apply 1 Application topically 3 (three) times daily.  Patient not taking: Reported on 09/03/2024   Dreama, [place]  N, FNP  Active   nystatin  (MYCOSTATIN /NYSTOP ) powder [id number]  Apply 1 Application topically 3 (three) times daily.  Patient not taking: Reported on 09/03/2024   Breeback, Jade L, PA-C  Active   nystatin -triamcinolone  ointment [facility]) [id number]  Apply 1 Application topically 2 (two) times daily.  Patient not taking: Reported on 09/17/2024   Breeback, Jade L, PA-C  Active   pantoprazole  (PROTONIX ) 40 MG tablet [id number] Yes TAKE 1 TABLET BY MOUTH DAILY Breeback, Jade L, PA-C  Active   pioglitazone  (ACTOS ) 30 MG tablet [id number] Yes TAKE 1 TABLET EVERY DAY Breeback, Jade L, PA-C  Active   pravastatin  (PRAVACHOL ) 10 MG tablet [id number] Yes Take 1 tablet (10 mg total) by mouth daily. Antoniette Vermell CROME, PA-C  Active   propranolol  (INDERAL ) 20 MG tablet [id number] Yes TAKE 1TABLET BY MOUTH TWICE DAILY  Breeback, Jade L, PA-C  Active   Semaglutide , 1 MG/DOSE, (OZEMPIC , 1 MG/DOSE,) 4 MG/3ML SOPN [id number] Yes INJECT 1MG  INTO THE SKIN ONCE A WEEK Breeback, Jade L, PA-C  Active   vitamin B-12 (CYANOCOBALAMIN ) 1000 MCG tablet [id number]  Take 1 tablet (1,000 mcg total) by mouth daily.  Patient not taking: Reported on 09/17/2024   Breeback, Jade L, PA-C  Active             Recommendation:   Continue Current Plan of Care  Follow Up Plan:   Telephone follow up appointment date/time:  09/24/24 in the afternoon  Shona Prow RN, CCM [place]  VBCI-Population Health RN Care Manager [phone]

## 2024-09-17 NOTE — Progress Notes (Addendum)
 "  Established Patient Office Visit  Subjective   Patient ID: [name] Rice, female    DOB: [birth year]  Age: 68 y.o. MRN: [id number]  HPI Discussed the use of AI scribe software for clinical note transcription with the patient, who gave verbal consent to proceed.  History of Present Illness [name] Rice is a 68 year old female with diabetes who presents for a follow-up visit. Her daughter accompanies her to visit.   Glycemic management - Diabetes managed with glucose sensor transmitting data to home reader - Recent phone upgrade may improve sensor connectivity - Hemoglobin A1c is 6 - Current medications: Toujeo  insulin  40 units nightly, Ozempic , Actos  (pioglitazone ) - Requires refills for medications and additional sensors - No symptoms of hypoglycemia or polydipsia  Peripheral neuropathy - Numbness present in some areas of both feet  Dermatologic symptoms- panniculitis. Reason for last hospital visit.  - Rash on right side with peeling - Left side skin described as 'wonderful' - Continues use of pads and cream for treatment - Previously received extra supplies for skin care - Small area on stomach with slight bleeding  Ophthalmologic care - Eye exam not yet scheduled  Mobility and functional status - Not currently ambulating but plans to start using walker and roller - Practicing standing and marching in place to build strength - Recently obtained new bathtub chair, allowing independent transfers and increased independence  Blood pressure management - Blood pressure reported as good - Taking losartan  - Uncertain if latest prescription for losartan  has been received - Plans to change pharmacy in January with insurance change      ROS See HPI.    Objective:     BP 119/85   Pulse 88   Wt 202 lb (91.6 kg)   LMP 10/18/1995   SpO2 99%   BMI 35.11 kg/m  BP Readings from Last 3 Encounters:  09/17/24 119/85  09/03/24 (!) 122/56  06/18/24 130/65   Wt Readings  from Last 3 Encounters:  09/17/24 202 lb (91.6 kg)  09/03/24 207 lb (93.9 kg)  05/30/24 208 lb (94.3 kg)     .SABRA Diabetic Foot Exam - Simple   Simple Foot Form Diabetic Foot exam was performed with the following findings: Yes 09/17/2024  3:42 PM  Visual Inspection No deformities, no ulcerations, no other skin breakdown bilaterally: Yes Sensation Testing See comments: Yes Pulse Check See comments: Yes Comments Pt has a prior history of bilateral foot neuropathy pt is unable to feel primary on the top of her toes and underneath her feet. Pulse intact bilaterally but weak.    .. Results for orders placed or performed in visit on 09/17/24  POCT HgB A1C   Collection Time: 09/17/24  3:44 PM  Result Value Ref Range   Hemoglobin A1C 6.0 (A) 4.0 - 5.6 %   HbA1c POC (<> result, manual entry)     HbA1c, POC (prediabetic range)     HbA1c, POC (controlled diabetic range)     *Note: Due to a large number of results and/or encounters for the requested time period, some results have not been displayed. A complete set of results can be found in Results Review.    Physical Exam Constitutional:      Appearance: Normal appearance. She is obese.     Comments: In wheelchair.   HENT:     Head: Normocephalic.  Cardiovascular:     Rate and Rhythm: Normal rate.  Pulmonary:     Effort: Pulmonary effort is normal.  Skin:    Comments: Slightly erythematous around umbilicus with some moist appearance.   Neurological:     General: No focal deficit present.     Mental Status: She is alert and oriented to person, place, and time.  Psychiatric:        Mood and Affect: Mood normal.      The 10-year ASCVD risk score (Arnett DK, et al., 2019) is: 20%    Assessment & Plan:  .Diagnoses and all orders for this visit:  Hospital discharge follow-up  Type 2 diabetes mellitus with diabetic neuropathy, with long-term current use of insulin  (HCC) -     POCT HgB A1C -     Semaglutide , 1 MG/DOSE,  (OZEMPIC , 1 MG/DOSE,) 4 MG/3ML SOPN; INJECT 1MG  INTO THE SKIN ONCE A WEEK -     insulin  glargine, 2 Unit Dial, (TOUJEO  MAX SOLOSTAR) 300 UNIT/ML Solostar Pen; Inject 40 Units into the skin at bedtime. -     Continuous Glucose Sensor (FREESTYLE LIBRE 3 PLUS SENSOR) MISC; Change sensor every 15 days.  Panniculitis  Morbid obesity (HCC) -     Ambulatory referral to Home Health  Diabetic peripheral neuropathy (HCC) -     Ambulatory referral to Home Health  Acute left-sided low back pain with left-sided sciatica -     Ambulatory referral to Home Health  Bilateral leg weakness -     Ambulatory referral to Home Health   Assessment & Plan Type 2 diabetes mellitus with diabetic neuropathy Diabetes well-controlled with A1c of 6.0. Diabetic neuropathy with numbness. No hypoglycemia reported. Issue with glucose monitor connectivity. - Refilled Ozempic  and Toujeo . - Refilled glucose monitoring sensors. - Encouraged eye exam scheduling. - Foot exam done today. Neuropathy noted no open wounds.  - Monitor glucose for nocturnal hypoglycemia. - Consider splitting insulin  dose if hypoglycemia occurs.  Morbid Obesity BMI of 35 associated with diabetes. - Losing weight with Ozempic  - Discussed importance of DM diet and movement  Skin rash, right sidePanniculitis Rash peeling on right side. Left side clear. - Continue pads and cream for rash. - Refilled medications from hospital for exacerbations.   Hematoma of abdominal wall Hematoma from injection site, not bleeding, appears as bruise. - Use pads or nonstick bandages to prevent irritation.  Mobility impairment Progress in standing and moving independently. Uses walker and roller. Home physical therapy recommended. - Referred to home physical therapy. - Encouraged standing and marching exercises. - Advised on safe transfer techniques.     Return in about 3 months (around 12/16/2024).    Hoa Deriso, PA-C  "

## 2024-09-24 ENCOUNTER — Other Ambulatory Visit (HOSPITAL_COMMUNITY): Payer: Self-pay

## 2024-09-26 ENCOUNTER — Other Ambulatory Visit: Payer: Self-pay | Admitting: Physician Assistant

## 2024-09-26 DIAGNOSIS — J301 Allergic rhinitis due to pollen: Secondary | ICD-10-CM

## 2024-09-27 ENCOUNTER — Telehealth: Payer: Self-pay | Admitting: Pharmacy Technician

## 2024-09-27 NOTE — Telephone Encounter (Signed)
 Pharmacy Patient Advocate Encounter   Received notification from Onbase that prior authorization for FreeStyle Libre 3 Plus Sensor is due for renewal.   Insurance verification completed.   The patient is insured through [place].  Action: PA required; PA submitted to above mentioned insurance via Latent Key/confirmation #/EOC [id number] Status is pending

## 2024-09-27 NOTE — Telephone Encounter (Signed)
 Pharmacy Patient Advocate Encounter  Received notification from [facility] that Prior Authorization for FreeStyle Libre 3 Plus Sensor has been APPROVED from 09/27/2024 to 10/16/2025.   PA #/Case ID/Reference #: [id number]

## 2024-10-08 ENCOUNTER — Other Ambulatory Visit: Payer: Self-pay | Admitting: Physician Assistant

## 2024-10-08 DIAGNOSIS — G894 Chronic pain syndrome: Secondary | ICD-10-CM

## 2024-10-08 NOTE — Telephone Encounter (Signed)
 Copied from CRM #[id number]. Topic: Clinical - Medication Refill >> Oct 08, 2024  4:15 PM Vivian Z wrote: Medication: HYDROcodone -acetaminophen  (NORCO) 10-325 MG tablet metroNIDAZOLE  (METROGEL ) 1 % gel (for Rosacea)  Has the patient contacted their pharmacy? Yes (Agent: If no, request that the patient contact the pharmacy for the refill. If patient does not wish to contact the pharmacy document the reason why and proceed with request.) (Agent: If yes, when and what did the pharmacy advise?)  This is the patient's preferred pharmacy:  [id number] - [place], [place] - [address] AT [facility] [address] [place] [address] Phone: [phone] Fax: [phone]  Is this the correct pharmacy for this prescription? Yes If no, delete pharmacy and type the correct one.   Has the prescription been filled recently? Yes  Is the patient out of the medication? No  Has the patient been seen for an appointment in the last year OR does the patient have an upcoming appointment? Yes  Can we respond through MyChart? No  Agent: Please be advised that Rx refills may take up to 3 business days. We ask that you follow-up with your pharmacy.

## 2024-10-08 NOTE — Telephone Encounter (Signed)
 metroNIDAZOLE  (METROGEL ) 1 % gel (for Rosacea) -- not on med list

## 2024-10-15 MED ORDER — HYDROCODONE-ACETAMINOPHEN 10-325 MG PO TABS: 1.0000 | ORAL_TABLET | Freq: Two times a day (BID) | ORAL | 0 refills | Status: AC

## 2024-10-18 ENCOUNTER — Encounter: Payer: Self-pay | Admitting: Physician Assistant

## 2024-10-25 ENCOUNTER — Telehealth: Payer: Self-pay

## 2024-10-25 NOTE — Telephone Encounter (Unsigned)
 Copied from CRM #[id number]. Topic: Clinical - Home Health Verbal Orders >> Oct 25, 2024  2:17 PM Myrick T wrote: Caller/Agency: Riza from [facility] Callback Number: [phone] Service Requested: Physical Therapy Frequency: 1x9w; OT evaluation Any new concerns about the patient? No

## 2024-10-28 NOTE — Telephone Encounter (Signed)
 Detailed message left with VO given Per Vermell Bologna, PA-C

## 2024-11-15 NOTE — Telephone Encounter (Signed)
 Copied from CRM #[id number]. Topic: General - Other >> Nov 15, 2024 10:08 AM Zebedee SAUNDERS wrote: Reason for CRM: Received call from [facility] per Lori ph: [phone] pt has refuse service repeated times. [facility] will be discharging pt.

## 2024-11-15 NOTE — Telephone Encounter (Signed)
 FYI

## 2024-11-15 NOTE — Telephone Encounter (Unsigned)
 Copied from CRM #[id number]. Topic: General - Other >> Nov 15, 2024 10:08 AM Zebedee SAUNDERS wrote: Reason for CRM: Received call from [facility] per Lori ph: [phone] pt has refuse service repeated times. [facility] will be discharging pt.

## 2024-12-16 ENCOUNTER — Ambulatory Visit: Admitting: Physician Assistant
